# Patient Record
Sex: Male | Born: 1935 | Race: White | Hispanic: No | Smoking: Former smoker
Health system: Southern US, Community
[De-identification: ages and names within clinical notes are randomized; demographics above are authoritative.]

## PROBLEM LIST (undated history)

## (undated) DIAGNOSIS — E119 Type 2 diabetes mellitus without complications: Secondary | ICD-10-CM

## (undated) DIAGNOSIS — G4730 Sleep apnea, unspecified: Secondary | ICD-10-CM

## (undated) DIAGNOSIS — J449 Chronic obstructive pulmonary disease, unspecified: Secondary | ICD-10-CM

## (undated) DIAGNOSIS — I10 Essential (primary) hypertension: Secondary | ICD-10-CM

## (undated) DIAGNOSIS — I2699 Other pulmonary embolism without acute cor pulmonale: Secondary | ICD-10-CM

## (undated) DIAGNOSIS — I2782 Chronic pulmonary embolism: Secondary | ICD-10-CM

## (undated) DIAGNOSIS — E7800 Pure hypercholesterolemia, unspecified: Secondary | ICD-10-CM

## (undated) DIAGNOSIS — Z952 Presence of prosthetic heart valve: Secondary | ICD-10-CM

## (undated) HISTORY — DX: Presence of prosthetic heart valve: Z95.2

## (undated) HISTORY — DX: Type 2 diabetes mellitus without complications: E11.9

## (undated) HISTORY — DX: Other pulmonary embolism without acute cor pulmonale: I26.99

## (undated) HISTORY — PX: OTHER SURGICAL HISTORY: SHX169

## (undated) HISTORY — DX: Chronic pulmonary embolism: I27.82

## (undated) HISTORY — PX: AORTIC VALVE REPLACEMENT: SHX41

## (undated) HISTORY — PX: REPLACEMENT TOTAL KNEE: SUR1224

---

## 2008-03-24 HISTORY — PX: CARDIAC CATHETERIZATION: SHX172

## 2008-10-22 DIAGNOSIS — I2699 Other pulmonary embolism without acute cor pulmonale: Secondary | ICD-10-CM

## 2008-10-22 DIAGNOSIS — R531 Weakness: Secondary | ICD-10-CM

## 2008-10-22 DIAGNOSIS — R739 Hyperglycemia, unspecified: Secondary | ICD-10-CM | POA: Diagnosis present

## 2008-10-22 DIAGNOSIS — N179 Acute kidney failure, unspecified: Secondary | ICD-10-CM | POA: Diagnosis present

## 2008-10-22 DIAGNOSIS — I2782 Chronic pulmonary embolism: Secondary | ICD-10-CM

## 2008-10-22 DIAGNOSIS — Z952 Presence of prosthetic heart valve: Secondary | ICD-10-CM

## 2008-10-22 DIAGNOSIS — Z66 Do not resuscitate: Secondary | ICD-10-CM | POA: Diagnosis present

## 2008-10-22 HISTORY — DX: Presence of prosthetic heart valve: Z95.2

## 2008-10-22 HISTORY — DX: Other pulmonary embolism without acute cor pulmonale: I26.99

## 2008-10-22 HISTORY — DX: Chronic pulmonary embolism: I27.82

## 2016-03-27 ENCOUNTER — Encounter (INDEPENDENT_AMBULATORY_CARE_PROVIDER_SITE_OTHER): Payer: Self-pay | Admitting: Internal Medicine

## 2016-03-27 ENCOUNTER — Encounter (INDEPENDENT_AMBULATORY_CARE_PROVIDER_SITE_OTHER): Payer: Self-pay

## 2016-04-22 ENCOUNTER — Ambulatory Visit (INDEPENDENT_AMBULATORY_CARE_PROVIDER_SITE_OTHER): Payer: Medicare Other | Admitting: Internal Medicine

## 2016-04-22 ENCOUNTER — Encounter (INDEPENDENT_AMBULATORY_CARE_PROVIDER_SITE_OTHER): Payer: Self-pay | Admitting: *Deleted

## 2016-04-22 ENCOUNTER — Encounter (INDEPENDENT_AMBULATORY_CARE_PROVIDER_SITE_OTHER): Payer: Self-pay | Admitting: Internal Medicine

## 2016-04-22 ENCOUNTER — Telehealth (INDEPENDENT_AMBULATORY_CARE_PROVIDER_SITE_OTHER): Payer: Self-pay | Admitting: *Deleted

## 2016-04-22 ENCOUNTER — Other Ambulatory Visit (INDEPENDENT_AMBULATORY_CARE_PROVIDER_SITE_OTHER): Payer: Self-pay | Admitting: Internal Medicine

## 2016-04-22 VITALS — BP 140/72 | HR 72 | Temp 97.8°F | Ht 73.0 in | Wt 220.5 lb

## 2016-04-22 DIAGNOSIS — K625 Hemorrhage of anus and rectum: Secondary | ICD-10-CM

## 2016-04-22 DIAGNOSIS — E7800 Pure hypercholesterolemia, unspecified: Secondary | ICD-10-CM | POA: Insufficient documentation

## 2016-04-22 DIAGNOSIS — I2782 Chronic pulmonary embolism: Secondary | ICD-10-CM | POA: Insufficient documentation

## 2016-04-22 DIAGNOSIS — E119 Type 2 diabetes mellitus without complications: Secondary | ICD-10-CM

## 2016-04-22 DIAGNOSIS — Z952 Presence of prosthetic heart valve: Secondary | ICD-10-CM | POA: Insufficient documentation

## 2016-04-22 HISTORY — DX: Type 2 diabetes mellitus without complications: E11.9

## 2016-04-22 NOTE — Progress Notes (Signed)
   Subjective:    Patient ID: Kevin Kirk, male    DOB: Sep 18, 1932, 83 y.o.   MRN: [id number]  HPI Referred by Dr. Shon Millet for rectal bleeding. Last colonoscopy was in 2008 by Dr. Algis Greenhouse (screening). Normal. Advised 10 yr f/u. He says Dr. Shon Millet wants him to have a colonoscopy. Apparently had some rectal bleeding in December x 3 days. BRRB. Patient stopped his Warfarin after seeing the blood. No change in his bowel.. Has a  BM daily.  No family hx of colon cancer. No weight loss. No GI problems except for the rectal bleeding in 2008.       Warfarin on hold since December.   02/06/2016 H and H 13.4 and 40.8  06/20/2014 Echo. EG 55-60%  Review of Systems Past Medical History:  Diagnosis Date  . Chronic pulmonary embolism ([place]) 04/22/2016  . Diabetes ([place]) 04/22/2016  . H/O aortic valve replacement 04/22/2016  . Pulmonary emboli ([place]) 04/22/2016    No past surgical history on file.  Allergies  Allergen Reactions  . Amoxicillin     hives    No current outpatient prescriptions on file prior to visit.   No current facility-administered medications on file prior to visit.    Current Outpatient Prescriptions  Medication Sig Dispense Refill  . Cholecalciferol (VITAMIN D3) 1000 units CAPS Take 5,000 Units by mouth.    . finasteride (PROSCAR) 5 MG tablet Take 5 mg by mouth daily.    Marland Kitchen glucosamine-chondroitin 500-400 MG tablet Take 1 tablet by mouth 3 (three) times daily.    Marland Kitchen lisinopril (PRINIVIL,ZESTRIL) 5 MG tablet Take 5 mg by mouth daily.    . metFORMIN (GLUCOPHAGE) 500 MG tablet Take by mouth 2 (two) times daily with a meal.    . metoprolol succinate (TOPROL-XL) 50 MG 24 hr tablet Take 50 mg by mouth 2 (two) times daily. Take with or immediately following a meal.    . vitamin B-12 (CYANOCOBALAMIN) 1000 MCG tablet Take 1,000 mcg by mouth daily.    Marland Kitchen atorvastatin (LIPITOR) 10 MG tablet Take 10 mg by mouth daily.     No current facility-administered medications for this  visit.         Objective:   Physical Exam Blood pressure 140/72, pulse 72, temperature 97.8 F (36.6 C), height 6\' 1"  (1.854 m), weight 220 lb 8 oz (100 kg).  Alert and oriented. Skin warm and dry. Oral mucosa is moist.   . Sclera anicteric, conjunctivae is pink. Thyroid not enlarged. No cervical lymphadenopathy. Lungs clear. Heart regular rate and rhythm.  Abdomen is soft. Bowel sounds are positive. No hepatomegaly. No abdominal masses felt. No tenderness.  No edema to lower extremities.        Assessment & Plan:  Rectal bleeding.  Colonic neoplasm, AVM polyp, ulcer needs to be ruled out.  Colonoscopy. The risks and benefits such as perforation, bleeding, and infection were reviewed with the patient and is agreeable.

## 2016-04-22 NOTE — Patient Instructions (Signed)
Colonoscopy.  The risks and benefits such as perforation, bleeding, and infection were reviewed with the patient and is agreeable. 

## 2016-04-22 NOTE — Telephone Encounter (Signed)
Patient needs trilyte 

## 2016-04-28 MED ORDER — PEG 3350-KCL-NA BICARB-NACL 420 G PO SOLR: 4000.0000 mL | Freq: Once | ORAL | 0 refills | Status: AC

## 2016-05-21 ENCOUNTER — Encounter (INDEPENDENT_AMBULATORY_CARE_PROVIDER_SITE_OTHER): Payer: Self-pay

## 2016-06-05 ENCOUNTER — Encounter (HOSPITAL_COMMUNITY)
Admission: RE | Disposition: A | Discharge status: Home / Self Care | Payer: Self-pay | Source: Ambulatory Visit | Attending: Internal Medicine

## 2016-06-05 ENCOUNTER — Encounter (HOSPITAL_COMMUNITY): Payer: Self-pay | Admitting: *Deleted

## 2016-06-05 ENCOUNTER — Ambulatory Visit (HOSPITAL_COMMUNITY)
Admission: RE | Admit: 2016-06-05 | Discharge: 2016-06-05 | Disposition: A | Discharge status: Home / Self Care | Payer: Medicare Other | Source: Ambulatory Visit | Attending: Internal Medicine | Admitting: Internal Medicine

## 2016-06-05 DIAGNOSIS — Z96659 Presence of unspecified artificial knee joint: Secondary | ICD-10-CM | POA: Insufficient documentation

## 2016-06-05 DIAGNOSIS — K5730 Diverticulosis of large intestine without perforation or abscess without bleeding: Secondary | ICD-10-CM

## 2016-06-05 DIAGNOSIS — K625 Hemorrhage of anus and rectum: Secondary | ICD-10-CM | POA: Diagnosis not present

## 2016-06-05 DIAGNOSIS — K6389 Other specified diseases of intestine: Secondary | ICD-10-CM

## 2016-06-05 DIAGNOSIS — Z952 Presence of prosthetic heart valve: Secondary | ICD-10-CM | POA: Insufficient documentation

## 2016-06-05 DIAGNOSIS — I10 Essential (primary) hypertension: Secondary | ICD-10-CM | POA: Diagnosis not present

## 2016-06-05 DIAGNOSIS — Z86711 Personal history of pulmonary embolism: Secondary | ICD-10-CM | POA: Insufficient documentation

## 2016-06-05 DIAGNOSIS — E119 Type 2 diabetes mellitus without complications: Secondary | ICD-10-CM | POA: Diagnosis not present

## 2016-06-05 DIAGNOSIS — C184 Malignant neoplasm of transverse colon: Secondary | ICD-10-CM

## 2016-06-05 DIAGNOSIS — Z7982 Long term (current) use of aspirin: Secondary | ICD-10-CM | POA: Diagnosis not present

## 2016-06-05 DIAGNOSIS — E7800 Pure hypercholesterolemia, unspecified: Secondary | ICD-10-CM | POA: Insufficient documentation

## 2016-06-05 DIAGNOSIS — Z79899 Other long term (current) drug therapy: Secondary | ICD-10-CM | POA: Diagnosis not present

## 2016-06-05 DIAGNOSIS — K921 Melena: Secondary | ICD-10-CM | POA: Insufficient documentation

## 2016-06-05 DIAGNOSIS — Z7984 Long term (current) use of oral hypoglycemic drugs: Secondary | ICD-10-CM | POA: Insufficient documentation

## 2016-06-05 HISTORY — DX: Pure hypercholesterolemia, unspecified: E78.00

## 2016-06-05 HISTORY — PX: COLONOSCOPY: SHX5424

## 2016-06-05 HISTORY — DX: Essential (primary) hypertension: I10

## 2016-06-05 LAB — GLUCOSE, CAPILLARY: Glucose-Capillary: 123 mg/dL — ABNORMAL HIGH (ref 65–99)

## 2016-06-05 SURGERY — COLONOSCOPY
Anesthesia: Moderate Sedation

## 2016-06-05 MED ORDER — SODIUM CHLORIDE 0.9 % IV SOLN
INTRAVENOUS | Status: DC
  Administered 2016-06-05: 13:00:00 via INTRAVENOUS

## 2016-06-05 MED ORDER — MIDAZOLAM HCL 5 MG/5ML IJ SOLN
INTRAMUSCULAR | Status: AC
  Filled 2016-06-05: qty 10

## 2016-06-05 MED ORDER — MEPERIDINE HCL 50 MG/ML IJ SOLN
INTRAMUSCULAR | Status: AC
  Filled 2016-06-05: qty 1

## 2016-06-05 MED ORDER — MIDAZOLAM HCL 5 MG/5ML IJ SOLN
INTRAMUSCULAR | Status: DC | PRN
  Administered 2016-06-05: 2 mg via INTRAVENOUS
  Administered 2016-06-05 (×2): 1 mg via INTRAVENOUS

## 2016-06-05 MED ORDER — MEPERIDINE HCL 50 MG/ML IJ SOLN
INTRAMUSCULAR | Status: DC | PRN
  Administered 2016-06-05 (×2): 25 mg

## 2016-06-05 MED ORDER — STERILE WATER FOR IRRIGATION IR SOLN
Status: DC | PRN
  Administered 2016-06-05: 13:00:00

## 2016-06-05 NOTE — Discharge Instructions (Signed)
Resume usual medications including aspirin. Resume usual diet. No driving for 24 hours. Physician will call with biopsy results and further recommendations.        Colonoscopy, Adult, Care After This sheet gives you information about how to care for yourself after your procedure. Your doctor may also give you more specific instructions. If you have problems or questions, call your doctor. Follow these instructions at home: General instructions    For the first 24 hours after the procedure:  Do not drive or use machinery.  Do not sign important documents.  Do not drink alcohol.  Do your daily activities more slowly than normal.  Eat foods that are soft and easy to digest.  Rest often.  Take over-the-counter or prescription medicines only as told by your doctor.  It is up to you to get the results of your procedure. Ask your doctor, or the department performing the procedure, when your results will be ready. To help cramping and bloating:   Try walking around.  Put heat on your belly (abdomen) as told by your doctor. Use a heat source that your doctor recommends, such as a moist heat pack or a heating pad.  Put a towel between your skin and the heat source.  Leave the heat on for 20-30 minutes.  Remove the heat if your skin turns bright red. This is especially important if you cannot feel pain, heat, or cold. You can get burned. Eating and drinking   Drink enough fluid to keep your pee (urine) clear or pale yellow.  Return to your normal diet as told by your doctor. Avoid heavy or fried foods that are hard to digest.  Avoid drinking alcohol for as long as told by your doctor. Contact a doctor if:  You have blood in your poop (stool) 2-3 days after the procedure. Get help right away if:  You have more than a small amount of blood in your poop.  You see large clumps of tissue (blood clots) in your poop.  Your belly is swollen.  You feel sick to your stomach  (nauseous).  You throw up (vomit).  You have a fever.  You have belly pain that gets worse, and medicine does not help your pain.    Diverticulosis Diverticulosis is a condition that develops when small pouches (diverticula) form in the wall of the large intestine (colon). The colon is where water is absorbed and stool is formed. The pouches form when the inside layer of the colon pushes through weak spots in the outer layers of the colon. You may have a few pouches or many of them. What are the causes? The cause of this condition is not known. What increases the risk? The following factors may make you more likely to develop this condition:  Being older than age 34. Your risk for this condition increases with age. Diverticulosis is rare among people younger than age 81. By age 58, many people have it.  Eating a low-fiber diet.  Having frequent constipation.  Being overweight.  Not getting enough exercise.  Smoking.  Taking over-the-counter pain medicines, like aspirin and ibuprofen.  Having a family history of diverticulosis. What are the signs or symptoms? In most people, there are no symptoms of this condition. If you do have symptoms, they may include:  Bloating.  Cramps in the abdomen.  Constipation or diarrhea.  Pain in the lower left side of the abdomen. How is this diagnosed? This condition is most often diagnosed during an exam  for other colon problems. Because diverticulosis usually has no symptoms, it often cannot be diagnosed independently. This condition may be diagnosed by:  Using a flexible scope to examine the colon (colonoscopy).  Taking an X-ray of the colon after dye has been put into the colon (barium enema).  Doing a CT scan. How is this treated? You may not need treatment for this condition if you have never developed an infection related to diverticulosis. If you have had an infection before, treatment may include:  Eating a high-fiber diet.  This may include eating more fruits, vegetables, and grains.  Taking a fiber supplement.  Taking a live bacteria supplement (probiotic).  Taking medicine to relax your colon.  Taking antibiotic medicines. Follow these instructions at home:  Drink 6-8 glasses of water or more each day to prevent constipation.  Try not to strain when you have a bowel movement.  If you have had an infection before:  Eat more fiber as directed by your health care provider or your diet and nutrition specialist (dietitian).  Take a fiber supplement or probiotic, if your health care provider approves.  Take over-the-counter and prescription medicines only as told by your health care provider.  If you were prescribed an antibiotic, take it as told by your health care provider. Do not stop taking the antibiotic even if you start to feel better.  Keep all follow-up visits as told by your health care provider. This is important. Contact a health care provider if:  You have pain in your abdomen.  You have bloating.  You have cramps.  You have not had a bowel movement in 3 days. Get help right away if:  Your pain gets worse.  Your bloating becomes very bad.  You have a fever or chills, and your symptoms suddenly get worse.  You vomit.  You have bowel movements that are bloody or black.  You have bleeding from your rectum. Summary  Diverticulosis is a condition that develops when small pouches (diverticula) form in the wall of the large intestine (colon).  You may have a few pouches or many of them.  This condition is most often diagnosed during an exam for other colon problems.  If you have had an infection related to diverticulosis, treatment may include increasing the fiber in your diet, taking supplements, or taking medicines. This information is not intended to replace advice given to you by your health care provider. Make sure you discuss any questions you have with your health care  provider. Document Released: 12/06/2003 Document Revised: 01/28/2016 Document Reviewed: 01/28/2016 Elsevier Interactive Patient Education  2017 [employer].

## 2016-06-05 NOTE — Op Note (Signed)
[facility] Patient Name: Kevin Kirk Procedure Date: 06/05/2016 1:09 PM MRN: [id number] Date of Birth: [birth year] Attending MD: Hildred Laser , MD CSN: [id number] Age: 83 Admit Type: Outpatient Procedure:                Colonoscopy Indications:              Rectal bleeding Providers:                Hildred Laser, MD, Janeece Riggers, RN, Purcell Nails. [place],                            [employer] Referring MD:             Tilden Fossa. Shon Millet, MD Medicines:                Meperidine 50 mg IV, Midazolam 4 mg IV Complications:            No immediate complications. Estimated Blood Loss:     Estimated blood loss was minimal. Procedure:                Pre-Anesthesia Assessment:                           - Prior to the procedure, a History and Physical                            was performed, and patient medications and                            allergies were reviewed. The patient's tolerance of                            previous anesthesia was also reviewed. The risks                            and benefits of the procedure and the sedation                            options and risks were discussed with the patient.                            All questions were answered, and informed consent                            was obtained. Prior Anticoagulants: The patient                            last took aspirin 1 day prior to the procedure. ASA                            Grade Assessment: III - A patient with severe                            systemic disease. After reviewing the risks and  benefits, the patient was deemed in satisfactory                            condition to undergo the procedure.                           After obtaining informed consent, the colonoscope                            was passed under direct vision. Throughout the                            procedure, the patient's blood pressure, pulse, and                            oxygen  saturations were monitored continuously. The                            [id number] ([id number]) scope was introduced through                            the anus and advanced to the the cecum, identified                            by appendiceal orifice and ileocecal valve. The                            colonoscopy was performed without difficulty. The                            patient tolerated the procedure well. The quality                            of the bowel preparation was adequate to identify                            polyps. The ileocecal valve, appendiceal orifice,                            and rectum were photographed. Scope In: 1:24:00 PM Scope Out: 1:56:20 PM Scope Withdrawal Time: 0 hours 12 minutes 28 seconds  Total Procedure Duration: 0 hours 32 minutes 20 seconds  Findings:      The perianal and digital rectal examinations were normal.      One 20 mm submucosal nodule was found in the cecum.      A fungating, polypoid and ulcerated non-obstructing large mass was found       in the mid transverse colon. The mass was partially circumferential       (involving two-thirds of the lumen circumference). The mass measured       three cm in length. No bleeding was present. Mucosa was biopsied with a       cold forceps for histology. One specimen bottle was sent to pathology.      Multiple medium-mouthed diverticula were found in the sigmoid colon.  The retroflexed view of the distal rectum and anal verge was normal and       showed no anal or rectal abnormalities. 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                            Biopsied.                           - Diverticulosis in the sigmoid colon. Moderate Sedation:      Moderate (conscious) sedation was administered by the endoscopy nurse       and supervised by the  endoscopist. The following parameters were       monitored: oxygen saturation, heart rate, blood pressure, CO2       capnography and response to care. Total physician intraservice time was       37 minutes. Recommendation:           - Patient has a contact number available for                            emergencies. The signs and symptoms of potential                            delayed complications were discussed with the                            patient. Return to normal activities tomorrow.                            Written discharge instructions were provided to the                            patient.                           - Resume previous diet today.                           - Continue present medications.                           - Await pathology results.                           - Repeat colonoscopy date to be determined after                            pending pathology results are reviewed.                           -  Will arrange for abdominopelvic CT with contrast                            once biopsy results reviewed. Procedure Code(s):        --- Professional ---                           [phone], Colonoscopy, flexible; diagnostic, including                            collection of specimen(s) by brushing or washing,                            when performed (separate procedure)                           99152, Moderate sedation services provided by the                            same physician or other qualified health care                            professional performing the diagnostic or                            therapeutic service that the sedation supports,                            requiring the presence of an independent trained                            observer to assist in the monitoring of the                            patient's level of consciousness and physiological                            status; initial 15 minutes of intraservice time,                             patient age 85 years or older                           [phone], Moderate sedation services; each additional                            15 minutes intraservice time Diagnosis Code(s):        --- Professional ---                           K63.89, Other specified diseases of intestine                           C18.4, Malignant neoplasm of transverse colon  K62.5, Hemorrhage of anus and rectum                           K57.30, Diverticulosis of large intestine without                            perforation or abscess without bleeding CPT copyright 2016 American Medical Association. All rights reserved. The codes documented in this report are preliminary and upon coder review may  be revised to meet current compliance requirements. Hildred Laser, MD Hildred Laser, MD 06/05/2016 2:28:50 PM This report has been signed electronically. Number of Addenda: 0

## 2016-06-05 NOTE — H&P (Signed)
Kevin Kirk is an 83 y.o. male.   Chief Complaint: Patient is here for colonoscopy. HPI: Patient is 83 year old Caucasian male with normal colonoscopy 8 years ago and experienced over 2 days of bright red blood per rectum back in December while he was on warfarin. He was managed at home by his PCP. He did experience some lightheadedness. Warfarin was discontinued. He is not on low-dose aspirin. He has not had any more episodes of rectal bleeding. Says he has lost 24 pounds since he lost his wife 6 months ago. He denies diarrhea and no constipation. Family History is negative for CRC.  Past Medical History:  Diagnosis Date  . Chronic pulmonary embolism ([place]) 04/22/2016  . Diabetes ([place]) 04/22/2016  . H/O aortic valve replacement 04/22/2016  . Hypercholesteremia   . Hypertension   . Pulmonary emboli ([place]) 04/22/2016    Past Surgical History:  Procedure Laterality Date  . AORTIC VALVE REPLACEMENT     2010  . REPLACEMENT TOTAL KNEE     1996  . spenectomy     from trauma    Family History  Problem Relation Age of Onset  . Colon cancer Neg Hx    Social History:  reports that he has never smoked. He has never used smokeless tobacco. He reports that he does not drink alcohol or use drugs.  Allergies:  Allergies  Allergen Reactions  . Amoxicillin Hives    Has patient had a PCN reaction causing immediate rash, facial/tongue/throat swelling, SOB or lightheadedness with hypotension: No Has patient had a PCN reaction causing severe rash involving mucus membranes or skin necrosis: Yes Has patient had a PCN reaction that required hospitalization No Has patient had a PCN reaction occurring within the last 10 years: No If all of the above answers are "NO", then may proceed with Cephalosporin use.   . Prednisone     Pt does not want to take this medication   . Tamsulosin Hcl     Cant sleep, confusion     Medications Prior to Admission  Medication Sig Dispense Refill  . aspirin EC 81  MG tablet Take 81 mg by mouth daily.    Marland Kitchen atorvastatin (LIPITOR) 10 MG tablet Take 10 mg by mouth at bedtime.     . Cholecalciferol (VITAMIN D3) 1000 units CAPS Take 1,000 Units by mouth at bedtime.     . finasteride (PROSCAR) 5 MG tablet Take 5 mg by mouth at bedtime.     . Glucosamine HCl 1000 MG TABS Take 1,000 mg by mouth 2 (two) times daily.    Marland Kitchen lisinopril (PRINIVIL,ZESTRIL) 5 MG tablet Take 5 mg by mouth at bedtime.     . metFORMIN (GLUCOPHAGE) 500 MG tablet Take 500 mg by mouth 2 (two) times daily with a meal.     . metoprolol (LOPRESSOR) 50 MG tablet Take 50 mg by mouth 2 (two) times daily.    . vitamin B-12 (CYANOCOBALAMIN) 1000 MCG tablet Take 1,000 mcg by mouth at bedtime.     Marland Kitchen acetaminophen (TYLENOL) 500 MG tablet Take 500 mg by mouth daily as needed for moderate pain or headache.      Results for orders placed or performed during the hospital encounter of 06/05/16 (from the past 48 hour(s))  Glucose, capillary     Status: Abnormal   Collection Time: 06/05/16 12:30 PM  Result Value Ref Range   Glucose-Capillary 123 (H) 65 - 99 mg/dL   No results found.  ROS  Blood pressure Marland Kitchen)  143/78, pulse 78, temperature 97.7 F (36.5 C), temperature source Oral, resp. rate 14, height 6\' 1"  (1.854 m), weight 216 lb (98 kg), SpO2 98 %. Physical Exam  Constitutional: He appears well-developed and well-nourished.  HENT:  Mouth/Throat: Oropharynx is clear and moist.  Eyes: Conjunctivae are normal. No scleral icterus.  Neck: No thyromegaly present.  Cardiovascular: Normal rate, regular rhythm and normal heart sounds.   No murmur heard. Respiratory: Effort normal and breath sounds normal.  GI:  The meniscus full symmetrical. It is soft with mild hypogastric tenderness. No organomegaly or masses.  Musculoskeletal: He exhibits no edema.  Lymphadenopathy:    He has no cervical adenopathy.  Neurological: He is alert.  Skin: Skin is warm and dry.     Assessment/Plan Large-volume  hematochezia. Diagnostic colonoscopy.  Hildred Laser, MD 06/05/2016, 1:15 PM

## 2016-06-10 ENCOUNTER — Encounter (HOSPITAL_COMMUNITY): Payer: Self-pay | Admitting: Internal Medicine

## 2016-06-10 ENCOUNTER — Other Ambulatory Visit (INDEPENDENT_AMBULATORY_CARE_PROVIDER_SITE_OTHER): Payer: Self-pay | Admitting: Internal Medicine

## 2016-06-10 DIAGNOSIS — C189 Malignant neoplasm of colon, unspecified: Secondary | ICD-10-CM

## 2016-06-20 ENCOUNTER — Ambulatory Visit (HOSPITAL_COMMUNITY)
Admission: RE | Admit: 2016-06-20 | Discharge: 2016-06-20 | Disposition: A | Discharge status: Home / Self Care | Payer: Medicare Other | Source: Ambulatory Visit | Attending: Internal Medicine | Admitting: Internal Medicine

## 2016-06-20 DIAGNOSIS — C189 Malignant neoplasm of colon, unspecified: Secondary | ICD-10-CM | POA: Diagnosis not present

## 2016-06-20 LAB — POCT I-STAT CREATININE: Creatinine, Ser: 1 mg/dL (ref 0.61–1.24)

## 2016-06-20 MED ORDER — IOPAMIDOL (ISOVUE-300) INJECTION 61%
100.0000 mL | Freq: Once | INTRAVENOUS | Status: AC | PRN
  Administered 2016-06-20: 100 mL via INTRAVENOUS

## 2016-07-03 ENCOUNTER — Ambulatory Visit (INDEPENDENT_AMBULATORY_CARE_PROVIDER_SITE_OTHER): Payer: Medicare Other | Admitting: General Surgery

## 2016-07-03 ENCOUNTER — Encounter: Payer: Self-pay | Admitting: General Surgery

## 2016-07-03 VITALS — BP 134/72 | HR 71 | Temp 97.7°F | Resp 18 | Ht 73.0 in | Wt 223.0 lb

## 2016-07-03 DIAGNOSIS — C184 Malignant neoplasm of transverse colon: Secondary | ICD-10-CM | POA: Diagnosis not present

## 2016-07-03 MED ORDER — METRONIDAZOLE 500 MG PO TABS: 500.0000 mg | ORAL_TABLET | Freq: Three times a day (TID) | ORAL | 0 refills | Status: DC

## 2016-07-03 MED ORDER — PEG 3350-KCL-NABCB-NACL-NASULF 236 G PO SOLR: 4000.0000 mL | Freq: Once | ORAL | 0 refills | Status: AC

## 2016-07-03 MED ORDER — NEOMYCIN SULFATE 500 MG PO TABS: 500.0000 mg | ORAL_TABLET | Freq: Three times a day (TID) | ORAL | 0 refills | Status: DC

## 2016-07-03 NOTE — Progress Notes (Signed)
Kevin Kirk; [id number]; April 17, 1932   HPI Patient is an 83 year old white male who was found on colonoscopy by Dr. Laural Golden to have a transverse colon mass. Biopsies of this revealed adenocarcinoma. The patient is being referred by Dr. Laural Golden for a partial colectomy. Patient denies any family history of colon cancer. He states he did have an episode of blood per rectum for which the colonoscopy was indicated. He denies any weight loss, fever, chills, diarrhea, or constipation. He states he has a remote history of pulmonary embolus after knee surgery. He is not on any anticoagulation. He has been seen by his primary care physician and cleared for surgery. Past Medical History:  Diagnosis Date  . Chronic pulmonary embolism ([place]) 04/22/2016  . Diabetes ([place]) 04/22/2016  . H/O aortic valve replacement 04/22/2016  . Hypercholesteremia   . Hypertension   . Pulmonary emboli ([place]) 04/22/2016    Past Surgical History:  Procedure Laterality Date  . AORTIC VALVE REPLACEMENT     2010  . COLONOSCOPY N/A 06/05/2016   Procedure: COLONOSCOPY;  Surgeon: Rogene Houston, MD;  [facility];  Service: Endoscopy;  Laterality: N/A;  . REPLACEMENT TOTAL KNEE     1996  . spenectomy     from trauma    Family History  Problem Relation Age of Onset  . Colon cancer Neg Hx     Current Outpatient Prescriptions on File Prior to Visit  Medication Sig Dispense Refill  . acetaminophen (TYLENOL) 500 MG tablet Take 500 mg by mouth daily as needed for moderate pain or headache.    Marland Kitchen aspirin EC 81 MG tablet Take 81 mg by mouth daily.    Marland Kitchen atorvastatin (LIPITOR) 10 MG tablet Take 10 mg by mouth at bedtime.     . Cholecalciferol (VITAMIN D3) 1000 units CAPS Take 1,000 Units by mouth at bedtime.     . finasteride (PROSCAR) 5 MG tablet Take 5 mg by mouth at bedtime.     . Glucosamine HCl 1000 MG TABS Take 1,000 mg by mouth 2 (two) times daily.    Marland Kitchen lisinopril (PRINIVIL,ZESTRIL) 5 MG tablet Take 5 mg by mouth at  bedtime.     . metFORMIN (GLUCOPHAGE) 500 MG tablet Take 500 mg by mouth 2 (two) times daily with a meal.     . metoprolol (LOPRESSOR) 50 MG tablet Take 50 mg by mouth 2 (two) times daily.    . vitamin B-12 (CYANOCOBALAMIN) 1000 MCG tablet Take 1,000 mcg by mouth at bedtime.      No current facility-administered medications on file prior to visit.     Allergies  Allergen Reactions  . Amoxicillin Hives    Has patient had a PCN reaction causing immediate rash, facial/tongue/throat swelling, SOB or lightheadedness with hypotension: No Has patient had a PCN reaction causing severe rash involving mucus membranes or skin necrosis: Yes Has patient had a PCN reaction that required hospitalization No Has patient had a PCN reaction occurring within the last 10 years: No If all of the above answers are "NO", then may proceed with Cephalosporin use.   . Prednisone     Pt does not want to take this medication   . Tamsulosin Hcl     Cant sleep, confusion     History  Alcohol Use No    History  Smoking Status  . Never Smoker  Smokeless Tobacco  . Never Used    Review of Systems  Constitutional: Negative.   HENT: Negative.   Eyes:  Negative.   Respiratory: Negative.   Cardiovascular: Negative.   Genitourinary: Negative.   Musculoskeletal: Positive for back pain.  Skin: Negative.   Neurological: Negative.   Endo/Heme/Allergies:       DVT after knee surgery.  Psychiatric/Behavioral: Negative.     Objective   Vitals:   07/03/16 0927  BP: 134/72  Pulse: 71  Resp: 18  Temp: 97.7 F (36.5 C)    Physical Exam  Constitutional: He is oriented to person, place, and time and well-developed, well-nourished, and in no distress.  HENT:  Head: Normocephalic and atraumatic.  Neck: Normal range of motion. Neck supple. No JVD present.  Cardiovascular: Normal rate, regular rhythm and normal heart sounds.   No murmur heard. Pulmonary/Chest: Effort normal and breath sounds normal. He has  no wheezes. He has no rales.  Abdominal: Soft. Bowel sounds are normal. He exhibits mass. He exhibits no distension. There is no tenderness.  Possible mass palpable in right upper quadrant of abdomen.  Lymphadenopathy:    He has no cervical adenopathy.  Neurological: He is alert and oriented to person, place, and time.  Skin: Skin is warm and dry.  Vitals reviewed.   Primary care physician's notes reviewed. Dr. Olevia Perches notes reviewed. CT scan of abdomen reviewed. Assessment  Colon carcinoma, history of DVT, history of PE, status post aortic valve replacement and past Plan   Patient is scheduled for a partial colectomy on 07/11/2016. The risks and benefits of the procedure including bleeding, infection, cardiopulmonary difficulties, the possibility of a blood transfusion, and the possibility of an anastomotic leak were fully explained to the patient, who gave informed consent. GoLYTELY, neomycin, and Flagyl have been ordered preoperatively.

## 2016-07-03 NOTE — Patient Instructions (Signed)
Colorectal Cancer Colorectal cancer is an abnormal growth of tissue (tumor) in the colon or rectum that is cancerous (malignant). Unlike noncancerous (benign) tumors, malignant tumors can spread to other parts of your body. The colon is the large bowel or large intestine. The rectum is the last several inches of the colon. What increases the risk? The exact cause of colorectal cancer is unknown. However, the following factors may increase your chances of getting colorectal cancer:  Age older than 11 years.  Abnormal growths (polyps) on the inner wall of the colon or rectum.  Diabetes.  African American race.  Family history of hereditary nonpolyposis colorectal cancer. This condition is caused by changes in the genes that are responsible for repairing mismatched DNA.  Personal history of cancer. A person who has already had colorectal cancer may develop it a second time. Also, women with a history of ovarian, uterine, or breast cancer are at a somewhat higher risk of developing colorectal cancer.  Certain hereditary conditions.  Eating a diet that is high in fat (especially animal fat) and low in fiber, fruits, and vegetables.  Sedentary lifestyle.  Inflammatory bowel disease, including ulcerative colitis and Crohn's disease.  Smoking.  Excessive alcohol use. What are the signs or symptoms? Early colorectal cancer often does not cause symptoms. As the cancer grows, symptoms may include:  Changes in bowel habits.  Diarrhea.  Constipation.  Feeling like the bowel does not empty completely after a bowel movement.  Blood in the stool.  Stools that are narrower than usual.  Abdominal discomfort, pain, bloating, fullness, or cramps.  Frequent gas pain.  Unexplained weight loss.  Constant tiredness.  Nausea and vomiting. How is this diagnosed? Your health care provider will ask about your medical history. He or she may also perform a number of procedures, such as:  A  physical exam.  A digital rectal exam.  A fecal occult blood test.  A barium enema.  Blood tests.  X-rays.  Imaging tests, such as CT scans or MRIs.  Taking a tissue sample (biopsy) from your colon or rectum to look for cancer cells.  A sigmoidoscopy to view the inside of the last part of your colon.  A colonoscopy to view the inside of your entire colon.  An endorectal ultrasound to see how deep a rectal tumor has grown and whether the cancer has spread to lymph nodes or other nearby tissues. Your cancer will be staged to determine its severity and extent. Staging is a careful attempt to find out the size of the tumor, whether the cancer has spread, and if so, to what parts of the body. You may need to have more tests to determine the stage of your cancer. The test results will help determine what treatment plan is best for you.  Stage 0. The cancer is found only in the innermost lining of the colon or rectum.  Stage I. The cancer has grown into the inner wall of the colon or rectum. The cancer has not yet reached the outer wall of the colon.  Stage II. The cancer extends more deeply into or through the wall of the colon or rectum. It may have invaded nearby tissue, but cancer cells have not spread to the lymph nodes.  Stage III. The cancer has spread to nearby lymph nodes but not to other parts of the body.  Stage IV. The cancer has spread to other parts of the body, such as the liver or lungs. Your health care provider may  tell you the detailed stage of your cancer, which includes both a number and a letter. How is this treated? Depending on the type and stage, colorectal cancer may be treated with surgery, radiation therapy, chemotherapy, targeted therapy, or radiofrequency ablation. Some people have a combination of these therapies. Surgery may be done to remove the polyps from your colon. In early stages, your health care provider may be able to do this during a colonoscopy.  In later stages, surgery may be done to remove part of your colon. Follow these instructions at home:  Take medicines only as directed by your health care provider.  Maintain a healthy diet.  Consider joining a support group. This may help you learn to cope with the stress of having colorectal cancer.  Seek advice to help you manage treatment of side effects.  Keep all follow-up visits as directed by your health care provider.  Inform your cancer specialist if you are admitted to the hospital. Contact a health care provider if:  Your diarrhea or constipation does not go away.  Your bowel habits change.  You have increased abdominal pain.  You notice new fatigue or weakness.  You lose weight. This information is not intended to replace advice given to you by your health care provider. Make sure you discuss any questions you have with your health care provider. Document Released: 03/10/2005 Document Revised: 08/16/2015 Document Reviewed: 09/02/2012 Elsevier Interactive Patient Education  2017 [employer].

## 2016-07-03 NOTE — H&P (Signed)
Kevin Kirk; [id number]; [birth year]   HPI Patient is an 83 year old white male who was found on colonoscopy by Dr. Laural Golden to have a transverse colon mass. Biopsies of this revealed adenocarcinoma. The patient is being referred by Dr. Laural Golden for a partial colectomy. Patient denies any family history of colon cancer. He states he did have an episode of blood per rectum for which the colonoscopy was indicated. He denies any weight loss, fever, chills, diarrhea, or constipation. He states he has a remote history of pulmonary embolus after knee surgery. He is not on any anticoagulation. He has been seen by his primary care physician and cleared for surgery.     Past Medical History:  Diagnosis Date  . Chronic pulmonary embolism ([place]) 04/22/2016  . Diabetes ([place]) 04/22/2016  . H/O aortic valve replacement 04/22/2016  . Hypercholesteremia   . Hypertension   . Pulmonary emboli ([place]) 04/22/2016         Past Surgical History:  Procedure Laterality Date  . AORTIC VALVE REPLACEMENT     2010  . COLONOSCOPY N/A 06/05/2016   Procedure: COLONOSCOPY;  Surgeon: Rogene Houston, MD;  Location[facility];  Service: Endoscopy;  Laterality: N/A;  . REPLACEMENT TOTAL KNEE     1996  . spenectomy     from trauma         Family History  Problem Relation Age of Onset  . Colon cancer Neg Hx           Current Outpatient Prescriptions on File Prior to Visit  Medication Sig Dispense Refill  . acetaminophen (TYLENOL) 500 MG tablet Take 500 mg by mouth daily as needed for moderate pain or headache.    Marland Kitchen aspirin EC 81 MG tablet Take 81 mg by mouth daily.    Marland Kitchen atorvastatin (LIPITOR) 10 MG tablet Take 10 mg by mouth at bedtime.     . Cholecalciferol (VITAMIN D3) 1000 units CAPS Take 1,000 Units by mouth at bedtime.     . finasteride (PROSCAR) 5 MG tablet Take 5 mg by mouth at bedtime.     . Glucosamine HCl 1000 MG TABS Take 1,000 mg by mouth 2 (two) times daily.    Marland Kitchen  lisinopril (PRINIVIL,ZESTRIL) 5 MG tablet Take 5 mg by mouth at bedtime.     . metFORMIN (GLUCOPHAGE) 500 MG tablet Take 500 mg by mouth 2 (two) times daily with a meal.     . metoprolol (LOPRESSOR) 50 MG tablet Take 50 mg by mouth 2 (two) times daily.    . vitamin B-12 (CYANOCOBALAMIN) 1000 MCG tablet Take 1,000 mcg by mouth at bedtime.      No current facility-administered medications on file prior to visit.          Allergies  Allergen Reactions  . Amoxicillin Hives    Has patient had a PCN reaction causing immediate rash, facial/tongue/throat swelling, SOB or lightheadedness with hypotension: No Has patient had a PCN reaction causing severe rash involving mucus membranes or skin necrosis: Yes Has patient had a PCN reaction that required hospitalization No Has patient had a PCN reaction occurring within the last 10 years: No If all of the above answers are "NO", then may proceed with Cephalosporin use.   . Prednisone     Pt does not want to take this medication   . Tamsulosin Hcl     Cant sleep, confusion        History  Alcohol Use No  History  Smoking Status  . Never Smoker  Smokeless Tobacco  . Never Used    Review of Systems  Constitutional: Negative.   HENT: Negative.   Eyes: Negative.   Respiratory: Negative.   Cardiovascular: Negative.   Genitourinary: Negative.   Musculoskeletal: Positive for back pain.  Skin: Negative.   Neurological: Negative.   Endo/Heme/Allergies:       DVT after knee surgery.  Psychiatric/Behavioral: Negative.     Objective      Vitals:   07/03/16 0927  BP: 134/72  Pulse: 71  Resp: 18  Temp: 97.7 F (36.5 C)    Physical Exam  Constitutional: He is oriented to person, place, and time and well-developed, well-nourished, and in no distress.  HENT:  Head: Normocephalic and atraumatic.  Neck: Normal range of motion. Neck supple. No JVD present.  Cardiovascular: Normal rate, regular  rhythm and normal heart sounds.   No murmur heard. Pulmonary/Chest: Effort normal and breath sounds normal. He has no wheezes. He has no rales.  Abdominal: Soft. Bowel sounds are normal. He exhibits mass. He exhibits no distension. There is no tenderness.  Possible mass palpable in right upper quadrant of abdomen.  Lymphadenopathy:    He has no cervical adenopathy.  Neurological: He is alert and oriented to person, place, and time.  Skin: Skin is warm and dry.  Vitals reviewed.   Primary care physician's notes reviewed. Dr. Olevia Perches notes reviewed. CT scan of abdomen reviewed. Assessment  Colon carcinoma, history of DVT, history of PE, status post aortic valve replacement and past Plan   Patient is scheduled for a partial colectomy on 07/11/2016. The risks and benefits of the procedure including bleeding, infection, cardiopulmonary difficulties, the possibility of a blood transfusion, and the possibility of an anastomotic leak were fully explained to the patient, who gave informed consent. GoLYTELY, neomycin, and Flagyl have been ordered preoperatively.

## 2016-07-04 NOTE — Patient Instructions (Signed)
    Kevin Kirk  07/04/2016     @PREFPERIOPPHARMACY @   Your procedure is scheduled on 07/11/2016.  Report to [facility] at 6:15 A.M.  Call this number if you have problems the morning of surgery:  [phone]   Remember:  Do not eat food or drink liquids after midnight.  Take these medicines the morning of surgery with A SIP OF WATER Lisinopril, Metoprolol   Do not wear jewelry, make-up or nail polish.  Do not wear lotions, powders, or perfumes, or deoderant.  Do not shave 48 hours prior to surgery.  Men may shave face and neck.  Do not bring valuables to the hospital.  [facility] is not responsible for any belongings or valuables.  Contacts, dentures or bridgework may not be worn into surgery.  Leave your suitcase in the car.  After surgery it may be brought to your room.  For patients admitted to the hospital, discharge time will be determined by your treatment team.  Patients discharged the day of surgery will not be allowed to drive home.    Please read over the following fact sheets that you were given. Surgical Site Infection Prevention and Anesthesia Post-op Instructions     PATIENT INSTRUCTIONS POST-ANESTHESIA  IMMEDIATELY FOLLOWING SURGERY:  Do not drive or operate machinery for the first twenty four hours after surgery.  Do not make any important decisions for twenty four hours after surgery or while taking narcotic pain medications or sedatives.  If you develop intractable nausea and vomiting or a severe headache please notify your doctor immediately.  FOLLOW-UP:  Please make an appointment with your surgeon as instructed. You do not need to follow up with anesthesia unless specifically instructed to do so.  WOUND CARE INSTRUCTIONS (if applicable):  Keep a dry clean dressing on the anesthesia/puncture wound site if there is drainage.  Once the wound has quit draining you may leave it open to air.  Generally you should leave the bandage intact for twenty four  hours unless there is drainage.  If the epidural site drains for more than 36-48 hours please call the anesthesia department.  QUESTIONS?:  Please feel free to call your physician or the hospital operator if you have any questions, and they will be happy to assist you.

## 2016-07-07 ENCOUNTER — Ambulatory Visit (HOSPITAL_COMMUNITY)
Admission: RE | Admit: 2016-07-07 | Discharge: 2016-07-07 | Disposition: A | Discharge status: Home / Self Care | Payer: Medicare Other | Source: Ambulatory Visit | Attending: General Surgery | Admitting: General Surgery

## 2016-07-07 ENCOUNTER — Encounter (HOSPITAL_COMMUNITY)
Admission: RE | Admit: 2016-07-07 | Discharge: 2016-07-07 | Disposition: A | Discharge status: Home / Self Care | Payer: Medicare Other | Source: Ambulatory Visit | Attending: General Surgery | Admitting: General Surgery

## 2016-07-07 ENCOUNTER — Other Ambulatory Visit: Payer: Self-pay

## 2016-07-07 ENCOUNTER — Encounter (HOSPITAL_COMMUNITY): Payer: Self-pay

## 2016-07-07 DIAGNOSIS — C189 Malignant neoplasm of colon, unspecified: Secondary | ICD-10-CM | POA: Diagnosis present

## 2016-07-07 DIAGNOSIS — I708 Atherosclerosis of other arteries: Secondary | ICD-10-CM | POA: Diagnosis not present

## 2016-07-07 DIAGNOSIS — Z951 Presence of aortocoronary bypass graft: Secondary | ICD-10-CM | POA: Diagnosis not present

## 2016-07-07 DIAGNOSIS — R918 Other nonspecific abnormal finding of lung field: Secondary | ICD-10-CM | POA: Diagnosis not present

## 2016-07-07 HISTORY — DX: Chronic obstructive pulmonary disease, unspecified: J44.9

## 2016-07-07 HISTORY — DX: Sleep apnea, unspecified: G47.30

## 2016-07-07 LAB — CBC WITH DIFFERENTIAL/PLATELET
Basophils Absolute: 0 10*3/uL (ref 0.0–0.1)
Basophils Relative: 0 %
Eosinophils Absolute: 0.4 10*3/uL (ref 0.0–0.7)
Eosinophils Relative: 4 %
HCT: 38.9 % — ABNORMAL LOW (ref 39.0–52.0)
Hemoglobin: 12.4 g/dL — ABNORMAL LOW (ref 13.0–17.0)
Lymphocytes Relative: 37 %
Lymphs Abs: 3.3 10*3/uL (ref 0.7–4.0)
MCH: 30.6 pg (ref 26.0–34.0)
MCHC: 31.9 g/dL (ref 30.0–36.0)
MCV: 96 fL (ref 78.0–100.0)
Monocytes Absolute: 0.8 10*3/uL (ref 0.1–1.0)
Monocytes Relative: 9 %
Neutro Abs: 4.5 10*3/uL (ref 1.7–7.7)
Neutrophils Relative %: 50 %
Platelets: 333 10*3/uL (ref 150–400)
RBC: 4.05 MIL/uL — ABNORMAL LOW (ref 4.22–5.81)
RDW: 15.4 % (ref 11.5–15.5)
WBC: 9 10*3/uL (ref 4.0–10.5)

## 2016-07-07 LAB — COMPREHENSIVE METABOLIC PANEL
ALT: 20 U/L (ref 17–63)
AST: 23 U/L (ref 15–41)
Albumin: 4 g/dL (ref 3.5–5.0)
Alkaline Phosphatase: 39 U/L (ref 38–126)
Anion gap: 8 (ref 5–15)
BUN: 16 mg/dL (ref 6–20)
CO2: 28 mmol/L (ref 22–32)
Calcium: 9.2 mg/dL (ref 8.9–10.3)
Chloride: 99 mmol/L — ABNORMAL LOW (ref 101–111)
Creatinine, Ser: 1.08 mg/dL (ref 0.61–1.24)
GFR calc Af Amer: >60 mL/min (ref >60)
GFR calc non Af Amer: >60 mL/min (ref >60)
Glucose, Bld: 161 mg/dL — ABNORMAL HIGH (ref 65–99)
Potassium: 4.7 mmol/L (ref 3.5–5.1)
Sodium: 135 mmol/L (ref 135–145)
Total Bilirubin: 0.6 mg/dL (ref 0.3–1.2)
Total Protein: 7.3 g/dL (ref 6.5–8.1)

## 2016-07-07 LAB — ABO/RH: ABO/RH(D): O POS

## 2016-07-07 LAB — PREPARE RBC (CROSSMATCH)

## 2016-07-11 ENCOUNTER — Inpatient Hospital Stay (HOSPITAL_COMMUNITY)
Admission: RE | Admit: 2016-07-11 | Discharge: 2016-07-19 | DRG: 330 | Disposition: A | Discharge status: Home / Self Care | Payer: Medicare Other | Source: Ambulatory Visit | Attending: General Surgery | Admitting: General Surgery

## 2016-07-11 ENCOUNTER — Inpatient Hospital Stay (HOSPITAL_COMMUNITY): Payer: Medicare Other | Admitting: Anesthesiology

## 2016-07-11 ENCOUNTER — Encounter (HOSPITAL_COMMUNITY)
Admission: RE | Disposition: A | Discharge status: Home / Self Care | Payer: Self-pay | Source: Ambulatory Visit | Attending: General Surgery

## 2016-07-11 ENCOUNTER — Encounter (HOSPITAL_COMMUNITY): Payer: Self-pay | Admitting: *Deleted

## 2016-07-11 DIAGNOSIS — E876 Hypokalemia: Secondary | ICD-10-CM | POA: Diagnosis not present

## 2016-07-11 DIAGNOSIS — G4730 Sleep apnea, unspecified: Secondary | ICD-10-CM | POA: Diagnosis present

## 2016-07-11 DIAGNOSIS — K219 Gastro-esophageal reflux disease without esophagitis: Secondary | ICD-10-CM | POA: Diagnosis present

## 2016-07-11 DIAGNOSIS — E7800 Pure hypercholesterolemia, unspecified: Secondary | ICD-10-CM | POA: Diagnosis present

## 2016-07-11 DIAGNOSIS — Z888 Allergy status to other drugs, medicaments and biological substances status: Secondary | ICD-10-CM

## 2016-07-11 DIAGNOSIS — C184 Malignant neoplasm of transverse colon: Secondary | ICD-10-CM

## 2016-07-11 DIAGNOSIS — Z96659 Presence of unspecified artificial knee joint: Secondary | ICD-10-CM | POA: Diagnosis present

## 2016-07-11 DIAGNOSIS — Z0189 Encounter for other specified special examinations: Secondary | ICD-10-CM

## 2016-07-11 DIAGNOSIS — Z79899 Other long term (current) drug therapy: Secondary | ICD-10-CM | POA: Diagnosis not present

## 2016-07-11 DIAGNOSIS — K567 Ileus, unspecified: Secondary | ICD-10-CM | POA: Diagnosis not present

## 2016-07-11 DIAGNOSIS — Z7982 Long term (current) use of aspirin: Secondary | ICD-10-CM

## 2016-07-11 DIAGNOSIS — Z952 Presence of prosthetic heart valve: Secondary | ICD-10-CM

## 2016-07-11 DIAGNOSIS — I10 Essential (primary) hypertension: Secondary | ICD-10-CM | POA: Diagnosis present

## 2016-07-11 DIAGNOSIS — J449 Chronic obstructive pulmonary disease, unspecified: Secondary | ICD-10-CM | POA: Diagnosis present

## 2016-07-11 DIAGNOSIS — Z7984 Long term (current) use of oral hypoglycemic drugs: Secondary | ICD-10-CM | POA: Diagnosis not present

## 2016-07-11 DIAGNOSIS — Z9049 Acquired absence of other specified parts of digestive tract: Secondary | ICD-10-CM

## 2016-07-11 DIAGNOSIS — E119 Type 2 diabetes mellitus without complications: Secondary | ICD-10-CM | POA: Diagnosis present

## 2016-07-11 DIAGNOSIS — Z86711 Personal history of pulmonary embolism: Secondary | ICD-10-CM | POA: Diagnosis not present

## 2016-07-11 DIAGNOSIS — Z87891 Personal history of nicotine dependence: Secondary | ICD-10-CM | POA: Diagnosis not present

## 2016-07-11 DIAGNOSIS — Z880 Allergy status to penicillin: Secondary | ICD-10-CM | POA: Diagnosis not present

## 2016-07-11 DIAGNOSIS — Z86718 Personal history of other venous thrombosis and embolism: Secondary | ICD-10-CM

## 2016-07-11 HISTORY — PX: PARTIAL COLECTOMY: SHX5273

## 2016-07-11 LAB — GLUCOSE, CAPILLARY
Glucose-Capillary: 139 mg/dL — ABNORMAL HIGH (ref 65–99)
Glucose-Capillary: 184 mg/dL — ABNORMAL HIGH (ref 65–99)
Glucose-Capillary: 164 mg/dL — ABNORMAL HIGH (ref 65–99)
Glucose-Capillary: 148 mg/dL — ABNORMAL HIGH (ref 65–99)
Glucose-Capillary: 161 mg/dL — ABNORMAL HIGH (ref 65–99)

## 2016-07-11 SURGERY — COLECTOMY, PARTIAL
Anesthesia: General | Site: Abdomen

## 2016-07-11 MED ORDER — 0.9 % SODIUM CHLORIDE (POUR BTL) OPTIME
TOPICAL | Status: DC | PRN
  Administered 2016-07-11: 1000 mL
  Administered 2016-07-11: 2000 mL

## 2016-07-11 MED ORDER — POVIDONE-IODINE 10 % EX OINT
TOPICAL_OINTMENT | CUTANEOUS | Status: AC
  Filled 2016-07-11: qty 1

## 2016-07-11 MED ORDER — NEOSTIGMINE METHYLSULFATE 10 MG/10ML IV SOLN
INTRAVENOUS | Status: AC
  Filled 2016-07-11: qty 3

## 2016-07-11 MED ORDER — DIPHENHYDRAMINE HCL 50 MG/ML IJ SOLN: 12.5000 mg | Freq: Four times a day (QID) | INTRAMUSCULAR | Status: DC | PRN

## 2016-07-11 MED ORDER — ROCURONIUM BROMIDE 50 MG/5ML IV SOLN
INTRAVENOUS | Status: AC
  Filled 2016-07-11: qty 1

## 2016-07-11 MED ORDER — FENTANYL CITRATE (PF) 100 MCG/2ML IJ SOLN
INTRAMUSCULAR | Status: DC | PRN
  Administered 2016-07-11 (×7): 50 ug via INTRAVENOUS

## 2016-07-11 MED ORDER — ATORVASTATIN CALCIUM 10 MG PO TABS
10.0000 mg | ORAL_TABLET | Freq: Every day | ORAL | Status: DC
  Administered 2016-07-11 – 2016-07-13 (×3): 10 mg via ORAL
  Filled 2016-07-11 (×4): qty 1

## 2016-07-11 MED ORDER — BUPIVACAINE LIPOSOME 1.3 % IJ SUSP
INTRAMUSCULAR | Status: DC | PRN
  Administered 2016-07-11: 20 mL

## 2016-07-11 MED ORDER — EPHEDRINE SULFATE 50 MG/ML IJ SOLN
INTRAMUSCULAR | Status: AC
  Filled 2016-07-11: qty 2

## 2016-07-11 MED ORDER — PROPOFOL 10 MG/ML IV BOLUS
INTRAVENOUS | Status: AC
  Filled 2016-07-11: qty 20

## 2016-07-11 MED ORDER — KETOROLAC TROMETHAMINE 30 MG/ML IJ SOLN
30.0000 mg | Freq: Once | INTRAMUSCULAR | Status: AC
  Administered 2016-07-11: 30 mg via INTRAVENOUS
  Filled 2016-07-11: qty 1

## 2016-07-11 MED ORDER — BUPIVACAINE LIPOSOME 1.3 % IJ SUSP
INTRAMUSCULAR | Status: AC
  Filled 2016-07-11: qty 20

## 2016-07-11 MED ORDER — INSULIN ASPART 100 UNIT/ML ~~LOC~~ SOLN
0.0000 [IU] | Freq: Three times a day (TID) | SUBCUTANEOUS | Status: DC
  Administered 2016-07-11: 2 [IU] via SUBCUTANEOUS
  Administered 2016-07-12: 3 [IU] via SUBCUTANEOUS
  Administered 2016-07-12: 2 [IU] via SUBCUTANEOUS
  Administered 2016-07-12: 3 [IU] via SUBCUTANEOUS
  Administered 2016-07-13: 5 [IU] via SUBCUTANEOUS
  Administered 2016-07-13: 2 [IU] via SUBCUTANEOUS
  Administered 2016-07-13: 5 [IU] via SUBCUTANEOUS
  Administered 2016-07-14 – 2016-07-17 (×12): 3 [IU] via SUBCUTANEOUS
  Administered 2016-07-18 (×2): 2 [IU] via SUBCUTANEOUS
  Administered 2016-07-18: 3 [IU] via SUBCUTANEOUS
  Administered 2016-07-19: 2 [IU] via SUBCUTANEOUS

## 2016-07-11 MED ORDER — LACTATED RINGERS IV SOLN
INTRAVENOUS | Status: DC
  Administered 2016-07-11 (×2): via INTRAVENOUS

## 2016-07-11 MED ORDER — CHLORHEXIDINE GLUCONATE CLOTH 2 % EX PADS: 6.0000 | MEDICATED_PAD | Freq: Once | CUTANEOUS | Status: DC

## 2016-07-11 MED ORDER — METRONIDAZOLE IN NACL 5-0.79 MG/ML-% IV SOLN
500.0000 mg | INTRAVENOUS | Status: AC
  Administered 2016-07-11: 500 mg via INTRAVENOUS
  Filled 2016-07-11: qty 100

## 2016-07-11 MED ORDER — FENTANYL CITRATE (PF) 100 MCG/2ML IJ SOLN
INTRAMUSCULAR | Status: AC
  Filled 2016-07-11: qty 2

## 2016-07-11 MED ORDER — MIDAZOLAM HCL 2 MG/2ML IJ SOLN
1.0000 mg | INTRAMUSCULAR | Status: DC
  Administered 2016-07-11: 2 mg via INTRAVENOUS

## 2016-07-11 MED ORDER — SUCCINYLCHOLINE CHLORIDE 20 MG/ML IJ SOLN
INTRAMUSCULAR | Status: AC
  Filled 2016-07-11: qty 1

## 2016-07-11 MED ORDER — DIPHENHYDRAMINE HCL 12.5 MG/5ML PO ELIX: 12.5000 mg | ORAL_SOLUTION | Freq: Four times a day (QID) | ORAL | Status: DC | PRN

## 2016-07-11 MED ORDER — ALVIMOPAN 12 MG PO CAPS
12.0000 mg | ORAL_CAPSULE | Freq: Two times a day (BID) | ORAL | Status: DC
  Administered 2016-07-12 – 2016-07-15 (×7): 12 mg via ORAL
  Filled 2016-07-11 (×7): qty 1

## 2016-07-11 MED ORDER — ONDANSETRON 4 MG PO TBDP
4.0000 mg | ORAL_TABLET | Freq: Four times a day (QID) | ORAL | Status: DC | PRN
  Administered 2016-07-14: 4 mg via ORAL
  Filled 2016-07-11: qty 1

## 2016-07-11 MED ORDER — SIMETHICONE 80 MG PO CHEW
40.0000 mg | CHEWABLE_TABLET | Freq: Four times a day (QID) | ORAL | Status: DC | PRN
  Administered 2016-07-12 – 2016-07-14 (×4): 40 mg via ORAL
  Filled 2016-07-11 (×4): qty 1

## 2016-07-11 MED ORDER — METOPROLOL TARTRATE 50 MG PO TABS
50.0000 mg | ORAL_TABLET | Freq: Two times a day (BID) | ORAL | Status: DC
Start: 2016-07-11 — End: 2016-07-15
  Administered 2016-07-11 – 2016-07-15 (×8): 50 mg via ORAL
  Filled 2016-07-11 (×8): qty 1

## 2016-07-11 MED ORDER — GLYCOPYRROLATE 0.2 MG/ML IJ SOLN
INTRAMUSCULAR | Status: DC | PRN
  Administered 2016-07-11: .5 mg via INTRAVENOUS

## 2016-07-11 MED ORDER — GLYCOPYRROLATE 0.2 MG/ML IJ SOLN
INTRAMUSCULAR | Status: AC
  Filled 2016-07-11: qty 5

## 2016-07-11 MED ORDER — NEOSTIGMINE METHYLSULFATE 10 MG/10ML IV SOLN
INTRAVENOUS | Status: DC | PRN
  Administered 2016-07-11: 3 mg via INTRAVENOUS

## 2016-07-11 MED ORDER — HYDROCODONE-ACETAMINOPHEN 5-325 MG PO TABS
1.0000 | ORAL_TABLET | ORAL | Status: DC | PRN
  Administered 2016-07-12: 2 via ORAL
  Administered 2016-07-12 – 2016-07-13 (×2): 1 via ORAL
  Administered 2016-07-13: 2 via ORAL
  Filled 2016-07-11: qty 1
  Filled 2016-07-11 (×2): qty 2
  Filled 2016-07-11: qty 1

## 2016-07-11 MED ORDER — ONDANSETRON HCL 4 MG/2ML IJ SOLN
4.0000 mg | Freq: Once | INTRAMUSCULAR | Status: AC
  Administered 2016-07-11: 4 mg via INTRAVENOUS

## 2016-07-11 MED ORDER — SEVOFLURANE IN SOLN
RESPIRATORY_TRACT | Status: AC
  Filled 2016-07-11: qty 250

## 2016-07-11 MED ORDER — MIDAZOLAM HCL 2 MG/2ML IJ SOLN
INTRAMUSCULAR | Status: AC
  Filled 2016-07-11: qty 2

## 2016-07-11 MED ORDER — ROCURONIUM 10MG/ML (10ML) SYRINGE FOR MEDFUSION PUMP - OPTIME
INTRAVENOUS | Status: DC | PRN
  Administered 2016-07-11: 40 mg via INTRAVENOUS
  Administered 2016-07-11: 10 mg via INTRAVENOUS

## 2016-07-11 MED ORDER — LACTATED RINGERS IV SOLN
INTRAVENOUS | Status: DC
  Administered 2016-07-11 (×3): via INTRAVENOUS

## 2016-07-11 MED ORDER — HYDROMORPHONE HCL 1 MG/ML IJ SOLN
1.0000 mg | INTRAMUSCULAR | Status: DC | PRN
  Administered 2016-07-11: 1 mg via INTRAVENOUS
  Filled 2016-07-11: qty 1

## 2016-07-11 MED ORDER — ONDANSETRON HCL 4 MG/2ML IJ SOLN
4.0000 mg | Freq: Four times a day (QID) | INTRAMUSCULAR | Status: DC | PRN
  Administered 2016-07-14 – 2016-07-15 (×3): 4 mg via INTRAVENOUS
  Filled 2016-07-11 (×4): qty 2

## 2016-07-11 MED ORDER — LIDOCAINE HCL (PF) 1 % IJ SOLN
INTRAMUSCULAR | Status: AC
  Filled 2016-07-11: qty 5

## 2016-07-11 MED ORDER — ONDANSETRON HCL 4 MG/2ML IJ SOLN
INTRAMUSCULAR | Status: AC
  Filled 2016-07-11: qty 2

## 2016-07-11 MED ORDER — ENOXAPARIN SODIUM 40 MG/0.4ML ~~LOC~~ SOLN
40.0000 mg | SUBCUTANEOUS | Status: DC
  Administered 2016-07-12 – 2016-07-19 (×8): 40 mg via SUBCUTANEOUS
  Filled 2016-07-11 (×8): qty 0.4

## 2016-07-11 MED ORDER — FENTANYL CITRATE (PF) 250 MCG/5ML IJ SOLN
INTRAMUSCULAR | Status: AC
  Filled 2016-07-11: qty 5

## 2016-07-11 MED ORDER — ACETAMINOPHEN 650 MG RE SUPP: 650.0000 mg | Freq: Four times a day (QID) | RECTAL | Status: DC | PRN

## 2016-07-11 MED ORDER — ARTIFICIAL TEARS OP OINT
TOPICAL_OINTMENT | OPHTHALMIC | Status: AC
  Filled 2016-07-11: qty 3.5

## 2016-07-11 MED ORDER — POVIDONE-IODINE 10 % OINT PACKET
TOPICAL_OINTMENT | CUTANEOUS | Status: DC | PRN
  Administered 2016-07-11: 1 via TOPICAL

## 2016-07-11 MED ORDER — CIPROFLOXACIN IN D5W 400 MG/200ML IV SOLN
400.0000 mg | INTRAVENOUS | Status: AC
Start: 2016-07-11 — End: 2016-07-11
  Administered 2016-07-11: 400 mg via INTRAVENOUS
  Filled 2016-07-11: qty 200

## 2016-07-11 MED ORDER — LIDOCAINE HCL (CARDIAC) 10 MG/ML IV SOLN
INTRAVENOUS | Status: DC | PRN
  Administered 2016-07-11: 50 mg via INTRAVENOUS

## 2016-07-11 MED ORDER — HYDROMORPHONE HCL 1 MG/ML IJ SOLN: 0.2500 mg | INTRAMUSCULAR | Status: DC | PRN

## 2016-07-11 MED ORDER — LISINOPRIL 5 MG PO TABS
5.0000 mg | ORAL_TABLET | Freq: Every day | ORAL | Status: DC
  Administered 2016-07-11 – 2016-07-18 (×6): 5 mg via ORAL
  Filled 2016-07-11 (×7): qty 1

## 2016-07-11 MED ORDER — ENOXAPARIN SODIUM 40 MG/0.4ML ~~LOC~~ SOLN
40.0000 mg | Freq: Once | SUBCUTANEOUS | Status: AC
  Administered 2016-07-11: 40 mg via SUBCUTANEOUS
  Filled 2016-07-11: qty 0.4

## 2016-07-11 MED ORDER — ALVIMOPAN 12 MG PO CAPS
12.0000 mg | ORAL_CAPSULE | Freq: Once | ORAL | Status: AC
  Administered 2016-07-11: 12 mg via ORAL
  Filled 2016-07-11: qty 1

## 2016-07-11 MED ORDER — PROPOFOL 10 MG/ML IV BOLUS
INTRAVENOUS | Status: DC | PRN
  Administered 2016-07-11: 100 mg via INTRAVENOUS

## 2016-07-11 MED ORDER — ACETAMINOPHEN 325 MG PO TABS: 650.0000 mg | ORAL_TABLET | Freq: Four times a day (QID) | ORAL | Status: DC | PRN

## 2016-07-11 MED ORDER — SODIUM CHLORIDE 0.9 % IJ SOLN
INTRAMUSCULAR | Status: AC
  Filled 2016-07-11: qty 10

## 2016-07-11 MED ORDER — LORAZEPAM 2 MG/ML IJ SOLN
0.5000 mg | INTRAMUSCULAR | Status: DC | PRN
  Administered 2016-07-15: 0.5 mg via INTRAVENOUS
  Filled 2016-07-11: qty 1

## 2016-07-11 MED ORDER — EPHEDRINE SULFATE 50 MG/ML IJ SOLN
INTRAMUSCULAR | Status: DC | PRN
  Administered 2016-07-11: 10 mg via INTRAVENOUS
  Administered 2016-07-11 (×2): 5 mg via INTRAVENOUS

## 2016-07-11 SURGICAL SUPPLY — 67 items
APPLIER CLIP 11 MED OPEN (CLIP)
APPLIER CLIP 13 LRG OPEN (CLIP)
BAG HAMPER (MISCELLANEOUS) ×3 IMPLANT
BARRIER SKIN 2 3/4 (OSTOMY) IMPLANT
BARRIER SKIN 2 3/4 INCH (OSTOMY)
CELLS DAT CNTRL 66122 CELL SVR (MISCELLANEOUS) IMPLANT
CHLORAPREP W/TINT 26ML (MISCELLANEOUS) ×3 IMPLANT
CLAMP POUCH DRAINAGE QUIET (OSTOMY) IMPLANT
CLIP APPLIE 11 MED OPEN (CLIP) IMPLANT
CLIP APPLIE 13 LRG OPEN (CLIP) IMPLANT
CLOTH BEACON ORANGE TIMEOUT ST (SAFETY) ×3 IMPLANT
COVER LIGHT HANDLE STERIS (MISCELLANEOUS) ×6 IMPLANT
COVER MAYO STAND XLG (DRAPE) ×3 IMPLANT
DRAPE UTILITY W/TAPE 26X15 (DRAPES) ×6 IMPLANT
DRAPE WARM FLUID 44X44 (DRAPE) ×3 IMPLANT
DRSG OPSITE POSTOP 4X10 (GAUZE/BANDAGES/DRESSINGS) ×3 IMPLANT
DRSG OPSITE POSTOP 4X8 (GAUZE/BANDAGES/DRESSINGS) IMPLANT
ELECT BLADE 6 FLAT ULTRCLN (ELECTRODE) ×3 IMPLANT
ELECT REM PT RETURN 9FT ADLT (ELECTROSURGICAL) ×3
ELECTRODE REM PT RTRN 9FT ADLT (ELECTROSURGICAL) ×1 IMPLANT
FORMALIN 10 PREFIL 480ML (MISCELLANEOUS) IMPLANT
GLOVE BIOGEL PI IND STRL 7.0 (GLOVE) ×4 IMPLANT
GLOVE BIOGEL PI INDICATOR 7.0 (GLOVE) ×8
GLOVE SURG SS PI 7.5 STRL IVOR (GLOVE) ×6 IMPLANT
GOWN STRL REUS W/ TWL XL LVL3 (GOWN DISPOSABLE) ×2 IMPLANT
GOWN STRL REUS W/TWL LRG LVL3 (GOWN DISPOSABLE) ×12 IMPLANT
GOWN STRL REUS W/TWL XL LVL3 (GOWN DISPOSABLE) ×4
HANDLE SUCTION POOLE (INSTRUMENTS) ×1 IMPLANT
INST SET MAJOR GENERAL (KITS) ×3 IMPLANT
KIT BLADEGUARD II DBL (SET/KITS/TRAYS/PACK) ×3 IMPLANT
KIT ROOM TURNOVER APOR (KITS) ×3 IMPLANT
LIGASURE IMPACT 36 18CM CVD LR (INSTRUMENTS) ×3 IMPLANT
MANIFOLD NEPTUNE II (INSTRUMENTS) ×3 IMPLANT
NEEDLE HYPO 18GX1.5 BLUNT FILL (NEEDLE) ×3 IMPLANT
NEEDLE HYPO 21X1.5 SAFETY (NEEDLE) ×3 IMPLANT
NS IRRIG 1000ML POUR BTL (IV SOLUTION) ×9 IMPLANT
PACK ABDOMINAL MAJOR (CUSTOM PROCEDURE TRAY) ×3 IMPLANT
PAD ARMBOARD 7.5X6 YLW CONV (MISCELLANEOUS) ×3 IMPLANT
PENCIL HANDSWITCHING (ELECTRODE) ×3 IMPLANT
POUCH OSTOMY 2 3/4  H 3804 (WOUND CARE)
POUCH OSTOMY 2 PC DRNBL 2.25 (WOUND CARE) IMPLANT
POUCH OSTOMY 2 PC DRNBL 2.75 (WOUND CARE) IMPLANT
POUCH OSTOMY DRNBL 2 1/4 (WOUND CARE)
RELOAD LINEAR CUT PROX 55 BLUE (ENDOMECHANICALS) IMPLANT
RELOAD PROXIMATE 75MM BLUE (ENDOMECHANICALS) ×6 IMPLANT
RETRACTOR WND ALEXIS 25 LRG (MISCELLANEOUS) ×1 IMPLANT
RTRCTR WOUND ALEXIS 18CM MED (MISCELLANEOUS)
RTRCTR WOUND ALEXIS 25CM LRG (MISCELLANEOUS) ×3
SET BASIN LINEN APH (SET/KITS/TRAYS/PACK) ×3 IMPLANT
SPONGE LAP 18X18 X RAY DECT (DISPOSABLE) ×3 IMPLANT
STAPLER GUN LINEAR PROX 60 (STAPLE) ×3 IMPLANT
STAPLER PROXIMATE 55 BLUE (STAPLE) IMPLANT
STAPLER PROXIMATE 75MM BLUE (STAPLE) ×3 IMPLANT
STAPLER VISISTAT (STAPLE) ×3 IMPLANT
SUCTION POOLE HANDLE (INSTRUMENTS) ×3
SUCTION YANKAUER HANDLE (MISCELLANEOUS) ×3 IMPLANT
SUT CHROMIC 0 SH (SUTURE) IMPLANT
SUT CHROMIC 2 0 SH (SUTURE) ×3 IMPLANT
SUT CHROMIC 3 0 SH 27 (SUTURE) IMPLANT
SUT NOVA NAB GS-26 0 60 (SUTURE) IMPLANT
SUT PDS AB 0 CTX 60 (SUTURE) ×6 IMPLANT
SUT SILK 2 0 (SUTURE)
SUT SILK 2-0 18XBRD TIE 12 (SUTURE) IMPLANT
SUT SILK 3 0 SH CR/8 (SUTURE) ×3 IMPLANT
SYR 20CC LL (SYRINGE) ×3 IMPLANT
TOWEL OR 17X26 4PK STRL BLUE (TOWEL DISPOSABLE) ×3 IMPLANT
TRAY FOLEY CATH SILVER 16FR (SET/KITS/TRAYS/PACK) ×3 IMPLANT

## 2016-07-11 NOTE — Op Note (Signed)
Patient:  Kevin Kirk  DOB:  03/20/1933  MRN:  [id number]   Preop Diagnosis:  Colon carcinoma  Postop Diagnosis:  Same  Procedure:  Partial colectomy  Surgeon:  Aviva Signs, M.D.  Anes:  Gen. endotracheal  Indications:  Patient is an 83 year old white male who was found on colonoscopy to have a proximal transverse colon carcinoma. The risks and benefits of the procedure including bleeding, infection, cardiopulmonary difficulties, anastomotic leak, and the possibility of a blood transfusion were fully explained to the patient, who gave informed consent.  Procedure note:  The patient was placed in supine position. After induction of general endotracheal anesthesia, the abdomen was prepped and draped using the usual sterile technique with ChloraPrep. Surgical site confirmation was performed.  A midline incision was made from above the umbilicus to just below the umbilicus. The peritoneal cavity was entered into without difficulty. The liver was palpated and no abnormal lesions were noted. On palpation of the colon, the colon mass was noted in the mid part of the colon. It was elected to proceed with a right hemicolectomy. The right colon was mobilized along its peritoneal reflection. The colon hepatic flexure was taken down using the LigaSure. A GIA stapler was placed across the terminal ileum and fired. This was likewise done across the mid transverse colon, distal to the abnormal lesions. The mesentery was then divided using the LigaSure. Care was taken to avoid the right ureter. The specimen was then removed from the operative field and sent to pathology. A side to side enterocolic anastomosis was performed using a GIA-75 stapler. The enterotomy was closed using a TA 60 stapler. The staple line was bolstered using 3-0 silk sutures. Surrounding omentum was placed over the anastomosis and secured into place using a 3-0 silk suture. The mesenteric defect was closed using a 2-0 chromic catgut  running suture. The abdominal cavity was then copiously irrigated with normal saline. No abnormal bleeding was noted at the end of the procedure. The bowel was returned into the abdominal cavity and orderly fashion. All operating personnel been changed her gown and gloves. A new setup was used.  The fascia was reapproximated using a looped 0 PDS running suture. Subcutaneous layer was irrigated with normal saline. The skin was injected with Exparel. The skin was closed using staples. Betadine ointment and a dry sterile dressing were applied.  All tape and needle counts were correct at the end of the procedure. The patient was extubated in the operating room and transferred to PACU in stable condition.  Complications:  None  EBL:  150 mL  Specimen:  Right colon

## 2016-07-11 NOTE — Anesthesia Postprocedure Evaluation (Signed)
Anesthesia Post Note  Patient: Kevin Kirk  Procedure(s) Performed: Procedure(s) (LRB): PARTIAL COLECTOMY (N/A)  Patient location during evaluation: PACU Anesthesia Type: General Level of consciousness: awake, oriented and patient cooperative Pain management: pain level controlled Vital Signs Assessment: post-procedure vital signs reviewed and stable Respiratory status: spontaneous breathing Cardiovascular status: stable Postop Assessment: no signs of nausea or vomiting Anesthetic complications: no     Last Vitals:  Vitals:   07/11/16 0945 07/11/16 1000  BP: (!) 153/79 (!) 151/80  Pulse: 71 71  Resp: 14 15  Temp:      Last Pain:  Vitals:   07/11/16 0934  TempSrc:   PainSc: Asleep                 ADAMS, AMY A

## 2016-07-11 NOTE — Anesthesia Procedure Notes (Signed)
Procedure Name: Intubation Date/Time: 07/11/2016 8:41 AM Performed by: Tressie Stalker E Pre-anesthesia Checklist: Patient identified, Patient being monitored, Timeout performed, Emergency Drugs available and Suction available Patient Re-evaluated:Patient Re-evaluated prior to inductionOxygen Delivery Method: Circle system utilized Preoxygenation: Pre-oxygenation with 100% oxygen Intubation Type: IV induction Ventilation: Mask ventilation without difficulty Laryngoscope Size: Mac and 3 Grade View: Grade I Tube type: Oral Tube size: 8.0 mm Number of attempts: 1 Airway Equipment and Method: Stylet Placement Confirmation: ETT inserted through vocal cords under direct vision,  positive ETCO2 and breath sounds checked- equal and bilateral Secured at: 23 cm Tube secured with: Tape Dental Injury: Teeth and Oropharynx as per pre-operative assessment

## 2016-07-11 NOTE — Transfer of Care (Signed)
Immediate Anesthesia Transfer of Care Note  Patient: Kevin Kirk  Procedure(s) Performed: Procedure(s): PARTIAL COLECTOMY (N/A)  Patient Location: PACU  Anesthesia Type:General  Level of Consciousness: drowsy  Airway & Oxygen Therapy: Patient Spontanous Breathing and Patient connected to face mask oxygen  Post-op Assessment: Report given to RN  Post vital signs: Reviewed and stable  Last Vitals:  Vitals:   07/11/16 0640  Temp: 36.6 C    Last Pain:  Vitals:   07/11/16 0640  TempSrc: Oral      Patients Stated Pain Goal: 7 (50/27/74 1287)  Complications: No apparent anesthesia complications

## 2016-07-11 NOTE — Anesthesia Preprocedure Evaluation (Signed)
Anesthesia Evaluation  Patient identified by MRN, date of birth, ID band Patient awake    Reviewed: Allergy & Precautions, NPO status , Patient's Chart, lab work & pertinent test results  Airway Mallampati: II  TM Distance: >3 FB Neck ROM: Full    Dental  (+) Partial Upper   Pulmonary sleep apnea , COPD, former smoker, PE   breath sounds clear to auscultation       Cardiovascular hypertension, Pt. on medications + DVT  + Valvular Problems/Murmurs (s/p AVR)  Rhythm:Regular Rate:Normal     Neuro/Psych    GI/Hepatic   Endo/Other  diabetes, Type 2, Oral Hypoglycemic Agents  Renal/GU      Musculoskeletal   Abdominal   Peds  Hematology   Anesthesia Other Findings   Reproductive/Obstetrics                             Anesthesia Physical Anesthesia Plan  ASA: III  Anesthesia Plan: General   Post-op Pain Management:    Induction: Intravenous  Airway Management Planned: Oral ETT  Additional Equipment:   Intra-op Plan:   Post-operative Plan: Extubation in OR  Informed Consent: I have reviewed the patients History and Physical, chart, labs and discussed the procedure including the risks, benefits and alternatives for the proposed anesthesia with the patient or authorized representative who has indicated his/her understanding and acceptance.     Plan Discussed with:   Anesthesia Plan Comments:         Anesthesia Quick Evaluation

## 2016-07-11 NOTE — Interval H&P Note (Signed)
History and Physical Interval Note:  07/11/2016 7:08 AM  Kevin Kirk  has presented today for surgery, with the diagnosis of colon cancer  The various methods of treatment have been discussed with the patient and family. After consideration of risks, benefits and other options for treatment, the patient has consented to  Procedure(s): PARTIAL COLECTOMY (N/A) as a surgical intervention .  The patient's history has been reviewed, patient examined, no change in status, stable for surgery.  I have reviewed the patient's chart and labs.  Questions were answered to the patient's satisfaction.     Aviva Signs

## 2016-07-12 LAB — CBC
HCT: 34.8 % — ABNORMAL LOW (ref 39.0–52.0)
Hemoglobin: 11.5 g/dL — ABNORMAL LOW (ref 13.0–17.0)
MCH: 31.3 pg (ref 26.0–34.0)
MCHC: 33 g/dL (ref 30.0–36.0)
MCV: 94.6 fL (ref 78.0–100.0)
Platelets: 287 10*3/uL (ref 150–400)
RBC: 3.68 MIL/uL — ABNORMAL LOW (ref 4.22–5.81)
RDW: 15 % (ref 11.5–15.5)
WBC: 12.9 10*3/uL — ABNORMAL HIGH (ref 4.0–10.5)

## 2016-07-12 LAB — GLUCOSE, CAPILLARY
Glucose-Capillary: 170 mg/dL — ABNORMAL HIGH (ref 65–99)
Glucose-Capillary: 188 mg/dL — ABNORMAL HIGH (ref 65–99)
Glucose-Capillary: 139 mg/dL — ABNORMAL HIGH (ref 65–99)
Glucose-Capillary: 140 mg/dL — ABNORMAL HIGH (ref 65–99)

## 2016-07-12 LAB — BASIC METABOLIC PANEL
Anion gap: 6 (ref 5–15)
BUN: 9 mg/dL (ref 6–20)
CO2: 27 mmol/L (ref 22–32)
Calcium: 8.4 mg/dL — ABNORMAL LOW (ref 8.9–10.3)
Chloride: 100 mmol/L — ABNORMAL LOW (ref 101–111)
Creatinine, Ser: 0.75 mg/dL (ref 0.61–1.24)
GFR calc Af Amer: >60 mL/min (ref >60)
GFR calc non Af Amer: >60 mL/min (ref >60)
Glucose, Bld: 157 mg/dL — ABNORMAL HIGH (ref 65–99)
Potassium: 3.7 mmol/L (ref 3.5–5.1)
Sodium: 133 mmol/L — ABNORMAL LOW (ref 135–145)

## 2016-07-12 LAB — PHOSPHORUS: Phosphorus: 2.9 mg/dL (ref 2.5–4.6)

## 2016-07-12 LAB — MAGNESIUM: Magnesium: 1.5 mg/dL — ABNORMAL LOW (ref 1.7–2.4)

## 2016-07-12 MED ORDER — MAGNESIUM SULFATE 2 GM/50ML IV SOLN
2.0000 g | Freq: Once | INTRAVENOUS | Status: AC
Start: 2016-07-12 — End: 2016-07-12
  Administered 2016-07-12: 2 g via INTRAVENOUS
  Filled 2016-07-12: qty 50

## 2016-07-12 MED ORDER — KCL IN DEXTROSE-NACL 20-5-0.45 MEQ/L-%-% IV SOLN
INTRAVENOUS | Status: DC
  Administered 2016-07-12 – 2016-07-15 (×4): via INTRAVENOUS

## 2016-07-12 NOTE — Addendum Note (Signed)
Addendum  created 07/12/16 2100 by Ollen Bowl, CRNA   Sign clinical note

## 2016-07-12 NOTE — Progress Notes (Signed)
1 Day Post-Op  Subjective: Patient has moderate incisional pain. No flatus or bowel movement yet.  Objective: Vital signs in last 24 hours: Temp:  [96.4 F (35.8 C)-100.4 F (38 C)] 100.4 F (38 C) (04/21 0700) Pulse Rate:  [63-88] 88 (04/21 0700) Resp:  [13-18] 18 (04/21 0700) BP: (101-153)/(50-80) 152/72 (04/21 0700) SpO2:  [95 %-100 %] 96 % (04/21 0700) Last BM Date: 07/11/16  Intake/Output from previous day: 04/20 0701 - 04/21 0700 In: 3676.3 [P.O.:300; I.V.:3376.3] Out: 875 [Urine:725; Blood:150] Intake/Output this shift: No intake/output data recorded.  General appearance: alert, cooperative and no distress Resp: clear to auscultation bilaterally Cardio: regular rate and rhythm, S1, S2 normal, no murmur, click, rub or gallop GI: Soft, dressings dry and intact. Occasional bowel sounds appreciated.  Lab Results:   Recent Labs  07/12/16 0632  WBC 12.9*  HGB 11.5*  HCT 34.8*  PLT 287   BMET  Recent Labs  07/12/16 0632  NA 133*  K 3.7  CL 100*  CO2 27  GLUCOSE 157*  BUN 9  CREATININE 0.75  CALCIUM 8.4*   PT/INR No results for input(s): LABPROT, INR in the last 72 hours.  Studies/Results: No results found.  Anti-infectives: Anti-infectives    Start     Dose/Rate Route Frequency Ordered Stop   07/11/16 0644  metroNIDAZOLE (FLAGYL) IVPB 500 mg     500 mg 100 mL/hr over 60 Minutes Intravenous On call to O.R. 07/11/16 2800 07/11/16 0740   07/11/16 0644  ciprofloxacin (CIPRO) IVPB 400 mg     400 mg 200 mL/hr over 60 Minutes Intravenous On call to O.R. 07/11/16 0644 07/11/16 0800      Assessment/Plan: s/p Procedure(s): PARTIAL COLECTOMY Impression: Stable on postoperative day 1. Hypomagnesemia will be addressed. We'll get patient out of bed. Await full return of bowel function.  LOS: 1 day    Aviva Signs 07/12/2016

## 2016-07-12 NOTE — Anesthesia Postprocedure Evaluation (Signed)
Anesthesia Post Note  Patient: Kevin Kirk  Procedure(s) Performed: Procedure(s) (LRB): PARTIAL COLECTOMY (N/A)  Patient location during evaluation: Nursing Unit Anesthesia Type: General Level of consciousness: awake and alert and oriented Pain management: pain level controlled Vital Signs Assessment: post-procedure vital signs reviewed and stable Respiratory status: spontaneous breathing Cardiovascular status: stable and blood pressure returned to baseline Postop Assessment: no signs of nausea or vomiting Anesthetic complications: no     Last Vitals:  Vitals:   07/12/16 1100 07/12/16 1700  BP: (!) 141/72 (!) 142/70  Pulse: 70 80  Resp: 18 16  Temp: 36.5 C 36.4 C    Last Pain:  Vitals:   07/12/16 2043  TempSrc:   PainSc: 7                  Avrohom Mckelvin

## 2016-07-13 LAB — MAGNESIUM: Magnesium: 1.7 mg/dL (ref 1.7–2.4)

## 2016-07-13 LAB — BASIC METABOLIC PANEL
Anion gap: 8 (ref 5–15)
BUN: 8 mg/dL (ref 6–20)
CO2: 25 mmol/L (ref 22–32)
Calcium: 8.6 mg/dL — ABNORMAL LOW (ref 8.9–10.3)
Chloride: 100 mmol/L — ABNORMAL LOW (ref 101–111)
Creatinine, Ser: 0.7 mg/dL (ref 0.61–1.24)
GFR calc Af Amer: >60 mL/min (ref >60)
GFR calc non Af Amer: >60 mL/min (ref >60)
Glucose, Bld: 192 mg/dL — ABNORMAL HIGH (ref 65–99)
Potassium: 4.1 mmol/L (ref 3.5–5.1)
Sodium: 133 mmol/L — ABNORMAL LOW (ref 135–145)

## 2016-07-13 LAB — CBC
HCT: 39.8 % (ref 39.0–52.0)
Hemoglobin: 13.1 g/dL (ref 13.0–17.0)
MCH: 31 pg (ref 26.0–34.0)
MCHC: 32.9 g/dL (ref 30.0–36.0)
MCV: 94.3 fL (ref 78.0–100.0)
Platelets: 296 10*3/uL (ref 150–400)
RBC: 4.22 MIL/uL (ref 4.22–5.81)
RDW: 14.8 % (ref 11.5–15.5)
WBC: 18 10*3/uL — ABNORMAL HIGH (ref 4.0–10.5)

## 2016-07-13 LAB — CEA: CEA: 2.6 ng/mL (ref 0.0–4.7)

## 2016-07-13 LAB — GLUCOSE, CAPILLARY
Glucose-Capillary: 203 mg/dL — ABNORMAL HIGH (ref 65–99)
Glucose-Capillary: 202 mg/dL — ABNORMAL HIGH (ref 65–99)
Glucose-Capillary: 236 mg/dL — ABNORMAL HIGH (ref 65–99)

## 2016-07-13 LAB — PHOSPHORUS: Phosphorus: 2.5 mg/dL (ref 2.5–4.6)

## 2016-07-13 MED ORDER — MAGNESIUM HYDROXIDE 400 MG/5ML PO SUSP
30.0000 mL | Freq: Two times a day (BID) | ORAL | Status: DC
  Administered 2016-07-13 – 2016-07-17 (×6): 30 mL via ORAL
  Filled 2016-07-13 (×7): qty 30

## 2016-07-13 NOTE — Progress Notes (Signed)
2 Days Post-Op  Subjective: Patient is comfortable. Has been passing flatus, but no bowel movement yet.  Objective: Vital signs in last 24 hours: Temp:  [97.5 F (36.4 C)-98.8 F (37.1 C)] 97.7 F (36.5 C) (04/22 1761) Pulse Rate:  [70-90] 80 (04/22 0613) Resp:  [16-18] 16 (04/22 6073) BP: (141-158)/(70-78) 152/77 (04/22 0613) SpO2:  [92 %-96 %] 95 % (04/22 7106) Last BM Date: 07/12/16  Intake/Output from previous day: 04/21 0701 - 04/22 0700 In: 1562.5 [P.O.:480; I.V.:1082.5] Out: 600 [Urine:600] Intake/Output this shift: Total I/O In: 240 [P.O.:240] Out: -   General appearance: alert, cooperative and no distress Resp: clear to auscultation bilaterally Cardio: regular rate and rhythm, S1, S2 normal, no murmur, click, rub or gallop GI: Soft, mildly distended. Incision healing well. Minimal bowel sounds appreciated.  Lab Results:   Recent Labs  07/12/16 0632 07/13/16 0608  WBC 12.9* 18.0*  HGB 11.5* 13.1  HCT 34.8* 39.8  PLT 287 296   BMET  Recent Labs  07/12/16 0632 07/13/16 0608  NA 133* 133*  K 3.7 4.1  CL 100* 100*  CO2 27 25  GLUCOSE 157* 192*  BUN 9 8  CREATININE 0.75 0.70  CALCIUM 8.4* 8.6*   PT/INR No results for input(s): LABPROT, INR in the last 72 hours.  Studies/Results: No results found.  Anti-infectives: Anti-infectives    Start     Dose/Rate Route Frequency Ordered Stop   07/11/16 0644  metroNIDAZOLE (FLAGYL) IVPB 500 mg     500 mg 100 mL/hr over 60 Minutes Intravenous On call to O.R. 07/11/16 2694 07/11/16 0740   07/11/16 8546  ciprofloxacin (CIPRO) IVPB 400 mg     400 mg 200 mL/hr over 60 Minutes Intravenous On call to O.R. 07/11/16 0644 07/11/16 0800      Assessment/Plan: s/p Procedure(s): PARTIAL COLECTOMY Impression: Stable on postoperative day 2. Leukocytosis may be secondary to the surgery. No wound infection or respiratory difficulties noted. Awaiting return of bowel function. We will ambulate patient.  LOS: 2 days     Aviva Signs 07/13/2016

## 2016-07-13 NOTE — Progress Notes (Signed)
Patient ambulated hallways with supervision and RW.  approx 250 feet.  Tolerated well.

## 2016-07-13 NOTE — Progress Notes (Signed)
Patient vomited x1. Green material noted. Patient with no further c/o nausea and does not wish to take anything for nausea at this time.

## 2016-07-14 ENCOUNTER — Inpatient Hospital Stay (HOSPITAL_COMMUNITY): Payer: Medicare Other

## 2016-07-14 ENCOUNTER — Encounter (HOSPITAL_COMMUNITY): Payer: Self-pay | Admitting: General Surgery

## 2016-07-14 LAB — GLUCOSE, CAPILLARY
Glucose-Capillary: 169 mg/dL — ABNORMAL HIGH (ref 65–99)
Glucose-Capillary: 195 mg/dL — ABNORMAL HIGH (ref 65–99)
Glucose-Capillary: 165 mg/dL — ABNORMAL HIGH (ref 65–99)
Glucose-Capillary: 189 mg/dL — ABNORMAL HIGH (ref 65–99)
Glucose-Capillary: 221 mg/dL — ABNORMAL HIGH (ref 65–99)

## 2016-07-14 LAB — CBC
HCT: 40.1 % (ref 39.0–52.0)
Hemoglobin: 13.6 g/dL (ref 13.0–17.0)
MCH: 31.6 pg (ref 26.0–34.0)
MCHC: 33.9 g/dL (ref 30.0–36.0)
MCV: 93 fL (ref 78.0–100.0)
Platelets: 286 10*3/uL (ref 150–400)
RBC: 4.31 MIL/uL (ref 4.22–5.81)
RDW: 14.4 % (ref 11.5–15.5)
WBC: 21.3 10*3/uL — ABNORMAL HIGH (ref 4.0–10.5)

## 2016-07-14 LAB — CK TOTAL AND CKMB (NOT AT ARMC)
CK, MB: 4.5 ng/mL (ref 0.5–5.0)
Relative Index: INVALID (ref 0.0–2.5)
Total CK: 63 U/L (ref 49–397)

## 2016-07-14 LAB — TROPONIN I: Troponin I: <0.03 ng/mL (ref <0.03)

## 2016-07-14 MED ORDER — IOPAMIDOL (ISOVUE-300) INJECTION 61%
100.0000 mL | Freq: Once | INTRAVENOUS | Status: AC | PRN
  Administered 2016-07-14: 100 mL via INTRAVENOUS

## 2016-07-14 MED ORDER — ALUM & MAG HYDROXIDE-SIMETH 200-200-20 MG/5ML PO SUSP
30.0000 mL | ORAL | Status: DC | PRN
  Administered 2016-07-14 – 2016-07-17 (×5): 30 mL via ORAL
  Filled 2016-07-14 (×4): qty 30

## 2016-07-14 MED ORDER — IOPAMIDOL (ISOVUE-300) INJECTION 61%
INTRAVENOUS | Status: AC
  Administered 2016-07-14: 30 mL
  Filled 2016-07-14: qty 30

## 2016-07-14 MED ORDER — SODIUM CHLORIDE 0.9 % IV SOLN
1.0000 g | INTRAVENOUS | Status: DC
  Administered 2016-07-14 – 2016-07-18 (×5): 1 g via INTRAVENOUS
  Filled 2016-07-14 (×8): qty 1

## 2016-07-14 NOTE — Care Management Note (Signed)
Case Management Note  Patient Details  Name: Kevin Kirk MRN: [id number] Date of Birth: [birth year]  Subjective/Objective:  S/p 3 days partial colectomy. From home, ind with ALD's prior to surgery. Having episode of emesis. CXR and CT of abd today.            Action/Plan: CM following for needs.    Expected Discharge Date:  07/14/16               Expected Discharge Plan:  Home/Self Care  In-House Referral:     Discharge planning Services  CM Consult  Post Acute Care Choice:    Choice offered to:     DME Arranged:    DME Agency:     HH Arranged:    HH Agency:     Status of Service:  In process, will continue to follow  If discussed at Long Length of Stay Meetings, dates discussed:    Additional Comments:  Abhiram Criado, Chauncey Reading, RN 07/14/2016, 12:12 PM

## 2016-07-14 NOTE — Progress Notes (Signed)
Patient c/o of chest pain, VSS. Patient vomited brown material and continues to c/o chest pain. EKG obtained showing a change from pre-operative EKG. MD called and orders obtained for cardiac enzymes and telemetry.

## 2016-07-14 NOTE — Progress Notes (Signed)
Patient vomited a large amount of brown material. MD paged and orders given for sips of clear liquids.

## 2016-07-14 NOTE — Progress Notes (Signed)
3 Days Post-Op  Subjective: Patient had multiple episodes of emesis. He also had some substernal chest pain with reflux. He states he feels much better this morning. He denies any chest pain or shortness of breath.  Objective: Vital signs in last 24 hours: Temp:  [97.6 F (36.4 C)-98 F (36.7 C)] 97.8 F (36.6 C) (04/23 0550) Pulse Rate:  [79-107] 107 (04/23 0550) Resp:  [16-20] 20 (04/23 0550) BP: (138-159)/(77-90) 159/90 (04/23 0550) SpO2:  [90 %-96 %] 93 % (04/23 0550) Last BM Date: 07/12/16  Intake/Output from previous day: 04/22 0701 - 04/23 0700 In: 1677.5 [P.O.:480; I.V.:1197.5] Out: -  Intake/Output this shift: No intake/output data recorded.  General appearance: alert, cooperative and no distress Resp: clear to auscultation bilaterally Cardio: regular rate and rhythm, S1, S2 normal, no murmur, click, rub or gallop GI: Soft, slightly distended. Incision healing well. Occasional bowel sounds appreciated.  Lab Results:   Recent Labs  07/13/16 0608 07/14/16 0220  WBC 18.0* 21.3*  HGB 13.1 13.6  HCT 39.8 40.1  PLT 296 286   BMET  Recent Labs  07/12/16 0632 07/13/16 0608  NA 133* 133*  K 3.7 4.1  CL 100* 100*  CO2 27 25  GLUCOSE 157* 192*  BUN 9 8  CREATININE 0.75 0.70  CALCIUM 8.4* 8.6*   PT/INR No results for input(s): LABPROT, INR in the last 72 hours.  Studies/Results: No results found.  Anti-infectives: Anti-infectives    Start     Dose/Rate Route Frequency Ordered Stop   07/11/16 0644  metroNIDAZOLE (FLAGYL) IVPB 500 mg     500 mg 100 mL/hr over 60 Minutes Intravenous On call to O.R. 07/11/16 4970 07/11/16 0740   07/11/16 0644  ciprofloxacin (CIPRO) IVPB 400 mg     400 mg 200 mL/hr over 60 Minutes Intravenous On call to O.R. 07/11/16 0644 07/11/16 0800      Assessment/Plan: s/p Procedure(s): PARTIAL COLECTOMY Impression: Suspect bowel dysfunction. Patient does have a leukocytosis, thus will get CT scan of the abdomen. We'll also get  a chest x-ray. His cardiac status is stable and initial troponin was negative. Further management pending those results.  LOS: 3 days    Aviva Signs 07/14/2016

## 2016-07-15 ENCOUNTER — Inpatient Hospital Stay (HOSPITAL_COMMUNITY): Payer: Medicare Other

## 2016-07-15 LAB — TYPE AND SCREEN
ABO/RH(D): O POS
Antibody Screen: NEGATIVE
Unit division: 0
Unit division: 0

## 2016-07-15 LAB — BPAM RBC
Blood Product Expiration Date: 201805032359
Blood Product Expiration Date: 201805072359
ISSUE DATE / TIME: 201804240841
ISSUE DATE / TIME: 201804241036
Unit Type and Rh: 5100
Unit Type and Rh: 5100

## 2016-07-15 LAB — BASIC METABOLIC PANEL
Anion gap: 11 (ref 5–15)
BUN: 25 mg/dL — ABNORMAL HIGH (ref 6–20)
CO2: 33 mmol/L — ABNORMAL HIGH (ref 22–32)
Calcium: 8.6 mg/dL — ABNORMAL LOW (ref 8.9–10.3)
Chloride: 84 mmol/L — ABNORMAL LOW (ref 101–111)
Creatinine, Ser: 1.07 mg/dL (ref 0.61–1.24)
GFR calc Af Amer: >60 mL/min (ref >60)
GFR calc non Af Amer: >60 mL/min (ref >60)
Glucose, Bld: 211 mg/dL — ABNORMAL HIGH (ref 65–99)
Potassium: 3.7 mmol/L (ref 3.5–5.1)
Sodium: 128 mmol/L — ABNORMAL LOW (ref 135–145)

## 2016-07-15 LAB — CBC
HCT: 37.7 % — ABNORMAL LOW (ref 39.0–52.0)
Hemoglobin: 12.4 g/dL — ABNORMAL LOW (ref 13.0–17.0)
MCH: 30.5 pg (ref 26.0–34.0)
MCHC: 32.9 g/dL (ref 30.0–36.0)
MCV: 92.9 fL (ref 78.0–100.0)
Platelets: 325 10*3/uL (ref 150–400)
RBC: 4.06 MIL/uL — ABNORMAL LOW (ref 4.22–5.81)
RDW: 14.3 % (ref 11.5–15.5)
WBC: 16.5 10*3/uL — ABNORMAL HIGH (ref 4.0–10.5)

## 2016-07-15 LAB — GLUCOSE, CAPILLARY
Glucose-Capillary: 198 mg/dL — ABNORMAL HIGH (ref 65–99)
Glucose-Capillary: 184 mg/dL — ABNORMAL HIGH (ref 65–99)
Glucose-Capillary: 193 mg/dL — ABNORMAL HIGH (ref 65–99)
Glucose-Capillary: 145 mg/dL — ABNORMAL HIGH (ref 65–99)

## 2016-07-15 MED ORDER — METOPROLOL TARTRATE 5 MG/5ML IV SOLN
2.5000 mg | Freq: Four times a day (QID) | INTRAVENOUS | Status: DC
  Administered 2016-07-15 – 2016-07-19 (×15): 2.5 mg via INTRAVENOUS
  Filled 2016-07-15 (×15): qty 5

## 2016-07-15 MED ORDER — POTASSIUM CHLORIDE 2 MEQ/ML IV SOLN
INTRAVENOUS | Status: DC
  Filled 2016-07-15 (×3): qty 1000

## 2016-07-15 MED ORDER — BISACODYL 10 MG RE SUPP
10.0000 mg | Freq: Two times a day (BID) | RECTAL | Status: DC
  Administered 2016-07-15 – 2016-07-17 (×5): 10 mg via RECTAL
  Filled 2016-07-15 (×6): qty 1

## 2016-07-15 MED ORDER — KCL IN DEXTROSE-NACL 20-5-0.9 MEQ/L-%-% IV SOLN
INTRAVENOUS | Status: DC
  Administered 2016-07-15 (×2): via INTRAVENOUS

## 2016-07-15 NOTE — Progress Notes (Signed)
Approximately 1500, call from Dr Arnoldo Morale okay to hook pt NG tube to low, intermittent suction.  By 1600, two cannisters with 1900cc of green liquid emptied.  Pt has stated "I am starting to feel better." will continue to monitor.

## 2016-07-15 NOTE — Progress Notes (Addendum)
Patient awake most of the night with acid reflux, nausea, and hiccups. Mylanta, Zofran, and Ativan given per PRN orders. Patient states that he has not urinated since 1500 yesterday. Patients abdomen distended from groin up to rib area. Patient does not feel urge to urinate. Patient does not think that he can tolerate laying flat for bladder scan. Patient has been up in chair all shift. Earlier, patient had vomited large amount of green liquid with some substance. Family member stated that vomit filled two emesis bags. Only 200 ml of vomit was left for this RN to observe, but amount was at least double that amount. PRN medication improved patient's heartburn earlier and did allow him to sleep for a short time, but not much more per patient. Will continue to monitor.

## 2016-07-15 NOTE — Progress Notes (Signed)
4 Days Post-Op  Subjective: Patient still with nausea and vomiting. Has not voided.  Objective: Vital signs in last 24 hours: Temp:  [98.3 F (36.8 C)-98.5 F (36.9 C)] 98.5 F (36.9 C) (04/24 0457) Pulse Rate:  [95-105] 101 (04/24 0457) Resp:  [20] 20 (04/24 0457) BP: (107-145)/(66-89) 137/70 (04/24 0457) SpO2:  [91 %-92 %] 91 % (04/24 0457) Last BM Date: 07/12/16 (Per patient,BM very small.)  Intake/Output from previous day: 04/23 0701 - 04/24 0700 In: 1883.8 [P.O.:300; I.V.:1533.8; IV Piggyback:50] Out: -  Intake/Output this shift: No intake/output data recorded.  General appearance: alert, cooperative, fatigued and no distress Resp: clear to auscultation bilaterally Cardio: regular rate and rhythm, S1, S2 normal, no murmur, click, rub or gallop GI: Distended with minimal bowel sounds. Incision healing well. No rigidity noted.  Lab Results:   Recent Labs  07/14/16 0220 07/15/16 0500  WBC 21.3* 16.5*  HGB 13.6 12.4*  HCT 40.1 37.7*  PLT 286 325   BMET  Recent Labs  07/13/16 0608 07/15/16 0500  NA 133* 128*  K 4.1 3.7  CL 100* 84*  CO2 25 33*  GLUCOSE 192* 211*  BUN 8 25*  CREATININE 0.70 1.07  CALCIUM 8.6* 8.6*   PT/INR No results for input(s): LABPROT, INR in the last 72 hours.  Studies/Results: Dg Chest 2 View  Result Date: 07/14/2016 CLINICAL DATA:  Vomiting and chest pressure EXAM: CHEST  2 VIEW COMPARISON:  07/07/2016 FINDINGS: Cardiac shadow is stable. Postsurgical changes are again noted. Mild bibasilar atelectatic changes are seen. Free air is noted related to the recent surgery. There is also suggestion of a small amount of fluid within the abdominal cavity as well. No acute bony abnormality is seen. IMPRESSION: Free air consistent with the recent surgical history. An air-fluid level is noted on the right which may be related to a small amount of fluid within the abdomen. CT may be helpful for further evaluation. These results will be called to  the ordering clinician or representative by the Radiologist Assistant, and communication documented in the PACS or zVision Dashboard. Electronically Signed   By: Inez Catalina M.D.   On: 07/14/2016 10:36   Ct Abdomen Pelvis W Contrast  Result Date: 07/14/2016 CLINICAL DATA:  Post RIGHT hemicolectomy on 07/11/2016 for colon cancer, vomiting EXAM: CT ABDOMEN AND PELVIS WITH CONTRAST TECHNIQUE: Multidetector CT imaging of the abdomen and pelvis was performed using the standard protocol following bolus administration of intravenous contrast. Sagittal and coronal MPR images reconstructed from axial data set. CONTRAST:  81mL ISOVUE-300 IOPAMIDOL (ISOVUE-300) INJECTION 61% PO, 137mL ISOVUE-300 IOPAMIDOL (ISOVUE-300) INJECTION 61% IV COMPARISON:  06/20/2016 FINDINGS: Lower chest: Bibasilar atelectasis Hepatobiliary: Few beam hardening artifacts from ribs traverse liver. Suspected tiny dependent gallstones. No definite biliary dilatation. Pancreas: Normal appearance Spleen: Surgically absent. Probable small splenule LEFT upper quadrant 19 x 13 mm image 21. Adrenals/Urinary Tract: Adrenal glands, kidneys, ureters, and bladder normal appearance Stomach/Bowel: Ileocolic anastomosis at the proximal to mid transverse colon. Diffuse dilatation of small bowel loops and stomach likely reflecting ileus. No evidence of gastric outlet obstruction. Mild bowel wall thickening of the distal ileum at the anastomosis consistent with recent surgery. Sigmoid diverticulosis. Remaining residual colon unremarkable. Vascular/Lymphatic: Scattered pelvic phleboliths. Scattered atherosclerotic calcifications without aneurysm. IVC filter noted. No definite adenopathy. Reproductive: Enlarged prostate gland 6.7 x 5.5 x 5.8 cm. Seminal vesicles unremarkable. Other: Low-attenuation free primarily in the RIGHT gutter and perihepatic region though a small amount is also present in the pelvis dependently and  interloop. Free intraperitoneal air greatest  in RIGHT upper quadrant. These are likely related to recent surgery. No hernia. Musculoskeletal: Osteoarthritic changes of the RIGHT hip joint. Scattered degenerative disc and facet disease changes lower lumbar spine. IMPRESSION: Dilated stomach and small bowel loops question postoperative ileus. Free air and free fluid likely related to recent surgery. Distal colonic diverticulosis. Bibasilar atelectasis. Prostatic enlargement. Electronically Signed   By: Lavonia Dana M.D.   On: 07/14/2016 11:24    Anti-infectives: Anti-infectives    Start     Dose/Rate Route Frequency Ordered Stop   07/14/16 1200  ertapenem (INVANZ) 1 g in sodium chloride 0.9 % 50 mL IVPB     1 g 100 mL/hr over 30 Minutes Intravenous Every 24 hours 07/14/16 1117     07/11/16 0644  metroNIDAZOLE (FLAGYL) IVPB 500 mg     500 mg 100 mL/hr over 60 Minutes Intravenous On call to O.R. 07/11/16 6378 07/11/16 0740   07/11/16 5885  ciprofloxacin (CIPRO) IVPB 400 mg     400 mg 200 mL/hr over 60 Minutes Intravenous On call to O.R. 07/11/16 0644 07/11/16 0800      Assessment/Plan: s/p Procedure(s): PARTIAL COLECTOMY Impression: Postoperative ileus, difficulty voiding  Plan: We will try Dulcolax suppositories. Should patient continue to have emesis, we will place an NG tube. We'll also place a Foley due to difficulty with voiding.  LOS: 4 days    Aviva Signs 07/15/2016

## 2016-07-16 LAB — CBC
HCT: 35.7 % — ABNORMAL LOW (ref 39.0–52.0)
Hemoglobin: 11.5 g/dL — ABNORMAL LOW (ref 13.0–17.0)
MCH: 30.3 pg (ref 26.0–34.0)
MCHC: 32.2 g/dL (ref 30.0–36.0)
MCV: 93.9 fL (ref 78.0–100.0)
Platelets: 279 10*3/uL (ref 150–400)
RBC: 3.8 MIL/uL — ABNORMAL LOW (ref 4.22–5.81)
RDW: 14.4 % (ref 11.5–15.5)
WBC: 13.8 10*3/uL — ABNORMAL HIGH (ref 4.0–10.5)

## 2016-07-16 LAB — BASIC METABOLIC PANEL
Anion gap: 9 (ref 5–15)
BUN: 28 mg/dL — ABNORMAL HIGH (ref 6–20)
CO2: 41 mmol/L — ABNORMAL HIGH (ref 22–32)
Calcium: 8 mg/dL — ABNORMAL LOW (ref 8.9–10.3)
Chloride: 84 mmol/L — ABNORMAL LOW (ref 101–111)
Creatinine, Ser: 1.19 mg/dL (ref 0.61–1.24)
GFR calc Af Amer: >60 mL/min (ref >60)
GFR calc non Af Amer: 55 mL/min — ABNORMAL LOW (ref >60)
Glucose, Bld: 198 mg/dL — ABNORMAL HIGH (ref 65–99)
Potassium: 3.1 mmol/L — ABNORMAL LOW (ref 3.5–5.1)
Sodium: 134 mmol/L — ABNORMAL LOW (ref 135–145)

## 2016-07-16 LAB — GLUCOSE, CAPILLARY
Glucose-Capillary: 176 mg/dL — ABNORMAL HIGH (ref 65–99)
Glucose-Capillary: 172 mg/dL — ABNORMAL HIGH (ref 65–99)
Glucose-Capillary: 174 mg/dL — ABNORMAL HIGH (ref 65–99)
Glucose-Capillary: 175 mg/dL — ABNORMAL HIGH (ref 65–99)

## 2016-07-16 MED ORDER — MENTHOL 3 MG MT LOZG: 1.0000 | LOZENGE | OROMUCOSAL | Status: DC | PRN

## 2016-07-16 MED ORDER — KCL IN DEXTROSE-NACL 40-5-0.45 MEQ/L-%-% IV SOLN
INTRAVENOUS | Status: DC
  Administered 2016-07-16 – 2016-07-17 (×4): via INTRAVENOUS

## 2016-07-16 NOTE — Progress Notes (Signed)
5 Days Post-Op  Subjective: Patient comfortable after having NG tube placed. Have large bowel movement last night. No complaints of pain.  Objective: Vital signs in last 24 hours: Temp:  [98.3 F (36.8 C)-98.9 F (37.2 C)] 98.9 F (37.2 C) (04/25 0636) Pulse Rate:  [90-107] 90 (04/25 0636) Resp:  [18] 18 (04/25 0636) BP: (118-132)/(57-74) 119/57 (04/25 0636) SpO2:  [83 %-98 %] 98 % (04/25 0636) Last BM Date: 07/15/16  Intake/Output from previous day: 04/24 0701 - 04/25 0700 In: 1783.8 [I.V.:1733.8; IV Piggyback:50] Out: 3500 [Urine:1000; Emesis/NG output:2500] Intake/Output this shift: Total I/O In: 0  Out: 750 [Emesis/NG output:750]  General appearance: alert, cooperative and no distress Resp: clear to auscultation bilaterally Cardio: regular rate and rhythm, S1, S2 normal, no murmur, click, rub or gallop GI: Soft, incision healing well. No distention noted. Bowel sounds present.  Lab Results:   Recent Labs  07/15/16 0500 07/16/16 0541  WBC 16.5* 13.8*  HGB 12.4* 11.5*  HCT 37.7* 35.7*  PLT 325 279   BMET  Recent Labs  07/15/16 0500 07/16/16 0541  NA 128* 134*  K 3.7 3.1*  CL 84* 84*  CO2 33* 41*  GLUCOSE 211* 198*  BUN 25* 28*  CREATININE 1.07 1.19  CALCIUM 8.6* 8.0*   PT/INR No results for input(s): LABPROT, INR in the last 72 hours.  Studies/Results: Dg Chest 1 View  Result Date: 07/15/2016 CLINICAL DATA:  Nasogastric tube placement. EXAM: CHEST 1 VIEW COMPARISON:  07/14/2016 FINDINGS: Nasogastric tube enters the abdomen, passing at least to the body of the stomach. Free intraperitoneal air remains evident. Mild basilar atelectasis. IMPRESSION: Nasogastric tube enters the stomach, tip at least at the mid body. Some residual intraperitoneal air. Mild basilar atelectasis. Electronically Signed   By: Nelson Chimes M.D.   On: 07/15/2016 14:56   Dg Chest 2 View  Result Date: 07/14/2016 CLINICAL DATA:  Vomiting and chest pressure EXAM: CHEST  2 VIEW  COMPARISON:  07/07/2016 FINDINGS: Cardiac shadow is stable. Postsurgical changes are again noted. Mild bibasilar atelectatic changes are seen. Free air is noted related to the recent surgery. There is also suggestion of a small amount of fluid within the abdominal cavity as well. No acute bony abnormality is seen. IMPRESSION: Free air consistent with the recent surgical history. An air-fluid level is noted on the right which may be related to a small amount of fluid within the abdomen. CT may be helpful for further evaluation. These results will be called to the ordering clinician or representative by the Radiologist Assistant, and communication documented in the PACS or zVision Dashboard. Electronically Signed   By: Inez Catalina M.D.   On: 07/14/2016 10:36   Ct Abdomen Pelvis W Contrast  Result Date: 07/14/2016 CLINICAL DATA:  Post RIGHT hemicolectomy on 07/11/2016 for colon cancer, vomiting EXAM: CT ABDOMEN AND PELVIS WITH CONTRAST TECHNIQUE: Multidetector CT imaging of the abdomen and pelvis was performed using the standard protocol following bolus administration of intravenous contrast. Sagittal and coronal MPR images reconstructed from axial data set. CONTRAST:  61mL ISOVUE-300 IOPAMIDOL (ISOVUE-300) INJECTION 61% PO, 134mL ISOVUE-300 IOPAMIDOL (ISOVUE-300) INJECTION 61% IV COMPARISON:  06/20/2016 FINDINGS: Lower chest: Bibasilar atelectasis Hepatobiliary: Few beam hardening artifacts from ribs traverse liver. Suspected tiny dependent gallstones. No definite biliary dilatation. Pancreas: Normal appearance Spleen: Surgically absent. Probable small splenule LEFT upper quadrant 19 x 13 mm image 21. Adrenals/Urinary Tract: Adrenal glands, kidneys, ureters, and bladder normal appearance Stomach/Bowel: Ileocolic anastomosis at the proximal to mid transverse colon. Diffuse  dilatation of small bowel loops and stomach likely reflecting ileus. No evidence of gastric outlet obstruction. Mild bowel wall thickening of  the distal ileum at the anastomosis consistent with recent surgery. Sigmoid diverticulosis. Remaining residual colon unremarkable. Vascular/Lymphatic: Scattered pelvic phleboliths. Scattered atherosclerotic calcifications without aneurysm. IVC filter noted. No definite adenopathy. Reproductive: Enlarged prostate gland 6.7 x 5.5 x 5.8 cm. Seminal vesicles unremarkable. Other: Low-attenuation free primarily in the RIGHT gutter and perihepatic region though a small amount is also present in the pelvis dependently and interloop. Free intraperitoneal air greatest in RIGHT upper quadrant. These are likely related to recent surgery. No hernia. Musculoskeletal: Osteoarthritic changes of the RIGHT hip joint. Scattered degenerative disc and facet disease changes lower lumbar spine. IMPRESSION: Dilated stomach and small bowel loops question postoperative ileus. Free air and free fluid likely related to recent surgery. Distal colonic diverticulosis. Bibasilar atelectasis. Prostatic enlargement. Electronically Signed   By: Lavonia Dana M.D.   On: 07/14/2016 11:24    Anti-infectives: Anti-infectives    Start     Dose/Rate Route Frequency Ordered Stop   07/14/16 1200  ertapenem (INVANZ) 1 g in sodium chloride 0.9 % 50 mL IVPB     1 g 100 mL/hr over 30 Minutes Intravenous Every 24 hours 07/14/16 1117     07/11/16 0644  metroNIDAZOLE (FLAGYL) IVPB 500 mg     500 mg 100 mL/hr over 60 Minutes Intravenous On call to O.R. 07/11/16 9295 07/11/16 0740   07/11/16 7473  ciprofloxacin (CIPRO) IVPB 400 mg     400 mg 200 mL/hr over 60 Minutes Intravenous On call to O.R. 07/11/16 0644 07/11/16 0800      Assessment/Plan: s/p Procedure(s): PARTIAL COLECTOMY Impression: Bowel function starting to return. Will leave NG tube in today and reassess later. Final pathology shows a T3, N1, M0 adenocarcinoma of the colon. One out of 16 lymph nodes were positive. Patient and daughter made aware. Will treat hypokalemia.  LOS: 5 days     Aviva Signs 07/16/2016

## 2016-07-17 LAB — CBC
HCT: 36.6 % — ABNORMAL LOW (ref 39.0–52.0)
Hemoglobin: 11.9 g/dL — ABNORMAL LOW (ref 13.0–17.0)
MCH: 30.9 pg (ref 26.0–34.0)
MCHC: 32.5 g/dL (ref 30.0–36.0)
MCV: 95.1 fL (ref 78.0–100.0)
Platelets: 271 10*3/uL (ref 150–400)
RBC: 3.85 MIL/uL — ABNORMAL LOW (ref 4.22–5.81)
RDW: 14.5 % (ref 11.5–15.5)
WBC: 12.4 10*3/uL — ABNORMAL HIGH (ref 4.0–10.5)

## 2016-07-17 LAB — BASIC METABOLIC PANEL
Anion gap: 9 (ref 5–15)
BUN: 18 mg/dL (ref 6–20)
CO2: 36 mmol/L — ABNORMAL HIGH (ref 22–32)
Calcium: 8.2 mg/dL — ABNORMAL LOW (ref 8.9–10.3)
Chloride: 89 mmol/L — ABNORMAL LOW (ref 101–111)
Creatinine, Ser: 0.89 mg/dL (ref 0.61–1.24)
GFR calc Af Amer: >60 mL/min (ref >60)
GFR calc non Af Amer: >60 mL/min (ref >60)
Glucose, Bld: 186 mg/dL — ABNORMAL HIGH (ref 65–99)
Potassium: 3.5 mmol/L (ref 3.5–5.1)
Sodium: 134 mmol/L — ABNORMAL LOW (ref 135–145)

## 2016-07-17 LAB — GLUCOSE, CAPILLARY
Glucose-Capillary: 168 mg/dL — ABNORMAL HIGH (ref 65–99)
Glucose-Capillary: 188 mg/dL — ABNORMAL HIGH (ref 65–99)
Glucose-Capillary: 151 mg/dL — ABNORMAL HIGH (ref 65–99)
Glucose-Capillary: 125 mg/dL — ABNORMAL HIGH (ref 65–99)

## 2016-07-17 MED ORDER — METOCLOPRAMIDE HCL 5 MG/ML IJ SOLN
10.0000 mg | Freq: Four times a day (QID) | INTRAMUSCULAR | Status: AC
  Administered 2016-07-17 – 2016-07-19 (×8): 10 mg via INTRAVENOUS
  Filled 2016-07-17 (×8): qty 2

## 2016-07-17 NOTE — Progress Notes (Signed)
Pt ambulated approx. 200 feet in the hall. Tolerated well. Still continuing to pass gas. Will continue to monitor.

## 2016-07-17 NOTE — Care Management Note (Signed)
Case Management Note  Patient Details  Name: HASON OFARRELL MRN: [id number] Date of Birth: 20-Aug-1932  If discussed at Long Length of Stay Meetings, dates discussed:  07/17/2016  Additional Comments:  Emaleigh Guimond, Chauncey Reading, RN 07/17/2016, 10:26 AM

## 2016-07-17 NOTE — Progress Notes (Signed)
Pt had a solid, broken up into small pieces BM. Pt is also passing moderate amount of gas. Pt has been sitting up in chair most of am but is back in bed now. Will continue to monitor.

## 2016-07-17 NOTE — Progress Notes (Signed)
6 Days Post-Op  Subjective: Patient had another bowel movement yesterday evening. It is passing flatus.  Objective: Vital signs in last 24 hours: Temp:  [97.8 F (36.6 C)-98.9 F (37.2 C)] 97.8 F (36.6 C) (04/26 0547) Pulse Rate:  [94-101] 96 (04/26 0547) Resp:  [18-20] 20 (04/26 0547) BP: (118-121)/(64-71) 118/71 (04/26 0547) SpO2:  [97 %-98 %] 97 % (04/26 0547) Last BM Date: 07/16/16  Intake/Output from previous day: 04/25 0701 - 04/26 0700 In: 580 [I.V.:530; IV Piggyback:50] Out: 4450 [Urine:1300; Emesis/NG output:3150] Intake/Output this shift: Total I/O In: -  Out: 500 [Emesis/NG output:500]  General appearance: alert, cooperative and no distress Resp: clear to auscultation bilaterally Cardio: regular rate and rhythm, S1, S2 normal, no murmur, click, rub or gallop GI: Soft, no distention. Bowel sounds active. Incision healing well.  Lab Results:   Recent Labs  07/16/16 0541 07/17/16 0635  WBC 13.8* 12.4*  HGB 11.5* 11.9*  HCT 35.7* 36.6*  PLT 279 271   BMET  Recent Labs  07/16/16 0541 07/17/16 0635  NA 134* 134*  K 3.1* 3.5  CL 84* 89*  CO2 41* 36*  GLUCOSE 198* 186*  BUN 28* 18  CREATININE 1.19 0.89  CALCIUM 8.0* 8.2*   PT/INR No results for input(s): LABPROT, INR in the last 72 hours.  Studies/Results: Dg Chest 1 View  Result Date: 07/15/2016 CLINICAL DATA:  Nasogastric tube placement. EXAM: CHEST 1 VIEW COMPARISON:  07/14/2016 FINDINGS: Nasogastric tube enters the abdomen, passing at least to the body of the stomach. Free intraperitoneal air remains evident. Mild basilar atelectasis. IMPRESSION: Nasogastric tube enters the stomach, tip at least at the mid body. Some residual intraperitoneal air. Mild basilar atelectasis. Electronically Signed   By: Nelson Chimes M.D.   On: 07/15/2016 14:56    Anti-infectives: Anti-infectives    Start     Dose/Rate Route Frequency Ordered Stop   07/14/16 1200  ertapenem (INVANZ) 1 g in sodium chloride 0.9 %  50 mL IVPB     1 g 100 mL/hr over 30 Minutes Intravenous Every 24 hours 07/14/16 1117     07/11/16 0644  metroNIDAZOLE (FLAGYL) IVPB 500 mg     500 mg 100 mL/hr over 60 Minutes Intravenous On call to O.R. 07/11/16 7579 07/11/16 0740   07/11/16 7282  ciprofloxacin (CIPRO) IVPB 400 mg     400 mg 200 mL/hr over 60 Minutes Intravenous On call to O.R. 07/11/16 0644 07/11/16 0800      Assessment/Plan: s/p Procedure(s): PARTIAL COLECTOMY Impression: Bowel function has returned. There is some variability as to how much NG tube output he has had. He has had 2 bowel movements over the past 24 hours. He does have active bowel sounds. We will remove NG tube and start full liquid diet. We'll also start Reglan for short course. This was discussed with the patient's daughter.  LOS: 6 days    Aviva Signs 07/17/2016

## 2016-07-17 NOTE — Care Management Note (Signed)
Case Management Note  Patient Details  Name: Kevin Kirk MRN: [id number] Date of Birth: February 02, 1933  Subjective/Objective:   Adm from home alone, ind with ADL's.   Has cane and RW if needs. Wears CPAP at night. Has PCP, still drives to appointments. Reports no issues affording medications.                  Action/Plan: Plans to return home with self care.    Expected Discharge Date:  07/14/16               Expected Discharge Plan:  Home/Self Care  In-House Referral:     Discharge planning Services  CM Consult  Post Acute Care Choice:    Choice offered to:  NA  DME Arranged:    DME Agency:     HH Arranged:    HH Agency:     Status of Service:  Completed, signed off  If discussed at [employer] of Stay Meetings, dates discussed:    Additional Comments:  Nikia Mangino, Chauncey Reading, RN 07/17/2016, 12:00 PM

## 2016-07-18 LAB — BASIC METABOLIC PANEL
Anion gap: 8 (ref 5–15)
BUN: 14 mg/dL (ref 6–20)
CO2: 32 mmol/L (ref 22–32)
Calcium: 8.1 mg/dL — ABNORMAL LOW (ref 8.9–10.3)
Chloride: 94 mmol/L — ABNORMAL LOW (ref 101–111)
Creatinine, Ser: 0.79 mg/dL (ref 0.61–1.24)
GFR calc Af Amer: >60 mL/min (ref >60)
GFR calc non Af Amer: >60 mL/min (ref >60)
Glucose, Bld: 151 mg/dL — ABNORMAL HIGH (ref 65–99)
Potassium: 3.6 mmol/L (ref 3.5–5.1)
Sodium: 134 mmol/L — ABNORMAL LOW (ref 135–145)

## 2016-07-18 LAB — CBC
HCT: 35.8 % — ABNORMAL LOW (ref 39.0–52.0)
Hemoglobin: 11.7 g/dL — ABNORMAL LOW (ref 13.0–17.0)
MCH: 31 pg (ref 26.0–34.0)
MCHC: 32.7 g/dL (ref 30.0–36.0)
MCV: 94.7 fL (ref 78.0–100.0)
Platelets: 291 10*3/uL (ref 150–400)
RBC: 3.78 MIL/uL — ABNORMAL LOW (ref 4.22–5.81)
RDW: 14.3 % (ref 11.5–15.5)
WBC: 13.6 10*3/uL — ABNORMAL HIGH (ref 4.0–10.5)

## 2016-07-18 LAB — GLUCOSE, CAPILLARY
Glucose-Capillary: 148 mg/dL — ABNORMAL HIGH (ref 65–99)
Glucose-Capillary: 188 mg/dL — ABNORMAL HIGH (ref 65–99)
Glucose-Capillary: 150 mg/dL — ABNORMAL HIGH (ref 65–99)
Glucose-Capillary: 170 mg/dL — ABNORMAL HIGH (ref 65–99)

## 2016-07-18 MED ORDER — METFORMIN HCL 500 MG PO TABS
500.0000 mg | ORAL_TABLET | Freq: Two times a day (BID) | ORAL | Status: DC
  Administered 2016-07-18 – 2016-07-19 (×2): 500 mg via ORAL
  Filled 2016-07-18 (×2): qty 1

## 2016-07-18 NOTE — Progress Notes (Signed)
7 Days Post-Op  Subjective: Patient doing well. Denies any significant abdominal pain, nausea, vomiting. Has had another bowel movement since I last saw him.  Objective: Vital signs in last 24 hours: Temp:  [98 F (36.7 C)-98.8 F (37.1 C)] 98 F (36.7 C) (04/27 0605) Pulse Rate:  [82-92] 82 (04/27 0605) Resp:  [18] 18 (04/27 0605) BP: (108-111)/(51-71) 110/71 (04/27 0605) SpO2:  [94 %-95 %] 95 % (04/27 0605) Last BM Date: 07/17/16  Intake/Output from previous day: 04/26 0701 - 04/27 0700 In: 3891.3 [P.O.:600; I.V.:3291.3] Out: 1700 [Urine:1200; Emesis/NG output:500] Intake/Output this shift: No intake/output data recorded.  General appearance: alert, cooperative and no distress Resp: clear to auscultation bilaterally Cardio: regular rate and rhythm, S1, S2 normal, no murmur, click, rub or gallop GI: Soft, nondistended. Bowel sounds active. Incision healing well.  Lab Results:   Recent Labs  07/17/16 0635 07/18/16 0630  WBC 12.4* 13.6*  HGB 11.9* 11.7*  HCT 36.6* 35.8*  PLT 271 291   BMET  Recent Labs  07/17/16 0635 07/18/16 0630  NA 134* 134*  K 3.5 3.6  CL 89* 94*  CO2 36* 32  GLUCOSE 186* 151*  BUN 18 14  CREATININE 0.89 0.79  CALCIUM 8.2* 8.1*   PT/INR No results for input(s): LABPROT, INR in the last 72 hours.  Studies/Results: No results found.  Anti-infectives: Anti-infectives    Start     Dose/Rate Route Frequency Ordered Stop   07/14/16 1200  ertapenem (INVANZ) 1 g in sodium chloride 0.9 % 50 mL IVPB     1 g 100 mL/hr over 30 Minutes Intravenous Every 24 hours 07/14/16 1117     07/11/16 0644  metroNIDAZOLE (FLAGYL) IVPB 500 mg     500 mg 100 mL/hr over 60 Minutes Intravenous On call to O.R. 07/11/16 7124 07/11/16 0740   07/11/16 5809  ciprofloxacin (CIPRO) IVPB 400 mg     400 mg 200 mL/hr over 60 Minutes Intravenous On call to O.R. 07/11/16 0644 07/11/16 0800      Assessment/Plan: s/p Procedure(s): PARTIAL COLECTOMY Impression:  Bowel function has returned. Will advance to soft diet. We will restart Glucophage. Anticipate discharge in next 24-48 hours.  LOS: 7 days    Aviva Signs 07/18/2016

## 2016-07-18 NOTE — Care Management Important Message (Signed)
Important Message  Patient Details  Name: Kevin Kirk MRN: [id number] Date of Birth: 1932-08-01   Medicare Important Message Given:  Yes    Sherald Barge, RN 07/18/2016, 1:21 PM

## 2016-07-19 LAB — GLUCOSE, CAPILLARY: Glucose-Capillary: 137 mg/dL — ABNORMAL HIGH (ref 65–99)

## 2016-07-19 NOTE — Discharge Summary (Signed)
Physician Discharge Summary  Patient ID: Kevin Kirk MRN: [id number] DOB/AGE: [birth year] 83 y.o.  Admit date: 07/11/2016 Discharge date: 07/19/2016  Admission Diagnoses:Colon carcinoma  Discharge Diagnoses: Same, T3, N1, M0 adenocarcinoma of colon Active Problems:   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HCC)   S/P partial colectomy Hypertension, non-insulin-dependent diabetes mellitus  Discharged Condition: good  Hospital Course: Patient is an 83 year old white male who was found on colonoscopy to have an adenocarcinoma of the ascending colon. He underwent a right hemicolectomy on 07/11/2016. He tolerated the surgery well. His postoperative course was remarkable for an ileus which took some time to resolve. Once his bowel function returned, his diet was advanced without difficulty. Final pathology revealed a T3, N1, N0 adenocarcinoma of the colon. He is being discharged home on 07/19/2016 in good and improving condition.  Treatments: surgery: Right hemicolectomy on 07/11/2016  Discharge Exam: Blood pressure 130/67, pulse 100, temperature 98.9 F (37.2 C), temperature source Oral, resp. rate 18, height 6\' 1"  (1.854 m), SpO2 93 %. General appearance: alert, cooperative and no distress Resp: clear to auscultation bilaterally Cardio: regular rate and rhythm, S1, S2 normal, no murmur, click, rub or gallop GI: Soft, incision healing well. Bowel sounds active.  Disposition: 01-Home or Self Care  Discharge Instructions    Diet - low sodium heart healthy    Complete by:  As directed    Increase activity slowly    Complete by:  As directed      Allergies as of 07/19/2016      Reactions   Amoxicillin Hives   Has patient had a PCN reaction causing immediate rash, facial/tongue/throat swelling, SOB or lightheadedness with hypotension: No Has patient had a PCN reaction causing severe rash involving mucus membranes or skin necrosis: Yes Has patient had a PCN reaction that required  hospitalization No Has patient had a PCN reaction occurring within the last 10 years: No If all of the above answers are "NO", then may proceed with Cephalosporin use.   Prednisone    Pt does not want to take this medication    Tamsulosin Hcl    Cant sleep, confusion       Medication List    STOP taking these medications   metroNIDAZOLE 500 MG tablet Commonly known as:  FLAGYL   neomycin 500 MG tablet Commonly known as:  MYCIFRADIN     TAKE these medications   acetaminophen 500 MG tablet Commonly known as:  TYLENOL Take 500 mg by mouth daily as needed for moderate pain or headache.   aspirin EC 81 MG tablet Take 81 mg by mouth daily.   atorvastatin 10 MG tablet Commonly known as:  LIPITOR Take 10 mg by mouth at bedtime.   ferrous sulfate 325 (65 FE) MG tablet Take 325 mg by mouth daily with breakfast.   finasteride 5 MG tablet Commonly known as:  PROSCAR Take 5 mg by mouth at bedtime.   Glucosamine HCl 1000 MG Tabs Take 1,000 mg by mouth 2 (two) times daily.   lisinopril 5 MG tablet Commonly known as:  PRINIVIL,ZESTRIL Take 5 mg by mouth at bedtime.   MAGNESIUM PO Take 265 mg by mouth daily.   metFORMIN 1000 MG tablet Commonly known as:  GLUCOPHAGE Take 500 mg by mouth 2 (two) times daily.   metoprolol 50 MG tablet Commonly known as:  LOPRESSOR Take 50 mg by mouth 2 (two) times daily.   vitamin B-12 1000 MCG tablet Commonly known as:  CYANOCOBALAMIN Take 1,000 mcg by  mouth daily.   Vitamin D3 1000 units Caps Take 1,000 Units by mouth daily.      Follow-up Information    Aviva Signs, MD. Schedule an appointment as soon as possible for a visit on 07/24/2016.   Specialty:  General Surgery Contact information: [id number] [place] [address] [phone]           Signed: Aviva Signs 07/19/2016, 8:26 AM

## 2016-07-19 NOTE — Progress Notes (Signed)
Late entry:  10:30 VSS, discharge medications and instructions reviewed w/pt and his daughter/caregiver.  S/s of infection reviewed and handout given.  Pt discharged home w/daughter and escorted to main entrance via [place] by CNA.

## 2016-07-19 NOTE — Discharge Instructions (Signed)
Open Colectomy, Care After °This sheet gives you information about how to care for yourself after your procedure. Your health care provider may also give you more specific instructions. If you have problems or questions, contact your health care provider. °What can I expect after the procedure? °After the procedure, it is common to have: °· Pain in your abdomen, especially along your incision. °· Tiredness. Your energy level will return to normal over the next several weeks. °· Constipation. °· Nausea. °· Difficulty urinating. °Follow these instructions at home: °Activity  °· You may be able to return to most of your normal activities within 1-2 weeks, such as working, walking up stairs, and sexual activity. °· Avoid activities that require a lot of energy for 4-6 weeks after surgery, such as running, climbing, and lifting heavy objects. Ask your health care provider what activities are safe for you. °· Take rest breaks during the day as needed. °· Do not drive for 1-2 weeks or until your health care provider says that it is safe. °· Do not drive or use heavy machinery while taking prescription pain medicines. °· Do not lift anything that is heavier than 10 lb (4.3 kg) until your health care provider says that it is safe. °Incision care  °· Follow instructions from your health care provider about how to take care of your incision. Make sure you: °¨ Wash your hands with soap and water before you change your bandage (dressing). If soap and water are not available, use hand sanitizer. °¨ Change your dressing as told by your health care provider. °¨ Leave stitches (sutures) or staples in place. These skin closures may need to stay in place for 2 weeks or longer. °· Avoid wearing tight clothing around your incision. °· Protect your incision area from the sun. °· Check your incision area every day for signs of infection. Check for: °¨ More redness, swelling, or pain. °¨ More fluid or blood. °¨ Warmth. °¨ Pus or a bad  smell. °General instructions  °· Do not take baths, swim, or use a hot tub until your health care provider approves. Ask your health care provider when you may shower. °· Take over-the-counter and prescription medicines, including stool softeners, only as told by your health care provider. °· Eat a low-fat and low-fiber diet for the first 4 weeks after surgery. °· Keep all follow-up visits as told by your health care provider. This is important. °Contact a health care provider if: °· You have more redness, swelling, or pain around your incision. °· You have more fluid or blood coming from your incision. °· Your incision feels warm to the touch. °· You have pus or a bad smell coming from your incision. °· You have a fever or chills. °· You do not have a bowel movement 2-3 days after surgery. °· You cannot eat or drink for 24 hours or more. °· You have persistent nausea and vomiting. °· You have abdominal pain that gets worse and does not get better with medicine. °Get help right away if: °· You have chest pain. °· You have shortness of breath. °· You have pain or swelling in your legs. °· Your incision breaks open after your sutures or staples have been removed. °· You have bleeding from the rectum. °This information is not intended to replace advice given to you by your health care provider. Make sure you discuss any questions you have with your health care provider. °Document Released: 10/01/2010 Document Revised: 12/10/2015 Document Reviewed: 12/10/2015 °Elsevier   Interactive Patient Education  2017 [employer].

## 2016-07-24 ENCOUNTER — Ambulatory Visit (INDEPENDENT_AMBULATORY_CARE_PROVIDER_SITE_OTHER): Payer: Self-pay | Admitting: General Surgery

## 2016-07-24 ENCOUNTER — Encounter: Payer: Self-pay | Admitting: General Surgery

## 2016-07-24 VITALS — BP 122/62 | HR 75 | Temp 97.1°F | Resp 18 | Ht 73.0 in | Wt 210.0 lb

## 2016-07-24 DIAGNOSIS — Z09 Encounter for follow-up examination after completed treatment for conditions other than malignant neoplasm: Secondary | ICD-10-CM

## 2016-07-24 NOTE — Progress Notes (Signed)
Subjective:     Kevin Kirk status post right hemicolectomy. Patient has done well since discharge. He has no complaints.  Objective:    BP 122/62   Pulse 75   Temp 97.1 F (36.2 C)   Resp 18   Ht 6\' 1"  (1.854 m)   Wt 210 lb (95.3 kg)   BMI 27.71 kg/m   General:  alert, cooperative and no distress  Abdomen is soft, incision healing well. Staples removed, Steri-Strips applied.     Final pathology revealed a T3, N1, M0 carcinoma of the colon  Assessment:    Doing well postoperatively.    Plan:Patient has been referred to oncology for further management and treatment. He is to follow up here as needed.

## 2016-07-29 ENCOUNTER — Encounter: Payer: Self-pay | Admitting: General Surgery

## 2016-07-29 ENCOUNTER — Ambulatory Visit (INDEPENDENT_AMBULATORY_CARE_PROVIDER_SITE_OTHER): Payer: Self-pay | Admitting: General Surgery

## 2016-07-29 VITALS — BP 126/64 | HR 89 | Temp 97.5°F | Resp 18 | Ht 73.0 in | Wt 209.0 lb

## 2016-07-29 DIAGNOSIS — Z09 Encounter for follow-up examination after completed treatment for conditions other than malignant neoplasm: Secondary | ICD-10-CM

## 2016-07-29 NOTE — Progress Notes (Signed)
Subjective:     Kevin Kirk  Patient woke up yesterday morning feeling bloated and had upper mid abdominal pain. He states the pain did resolve after he passed gas. His bowel movements have been normal. No blood in his stools are noted. He denies any fever or chills. He just presented today for me to examine him to make sure everything was okay. His pain is significantly decreased since yesterday. Objective:    BP 126/64   Pulse 89   Temp 97.5 F (36.4 C)   Resp 18   Ht 6\' 1"  (1.854 m)   Wt 209 lb (94.8 kg)   BMI 27.57 kg/m   General:  alert, cooperative and no distress  Abdomen is soft, nontender, nondistended. Incision healing well. No hernias appreciated. Bowel sounds are active.     Assessment:    Doing well postoperatively. Episode of gas pain which has since resolved.    Plan:   Reassured patient that he was doing well. Follow-up as needed.

## 2016-08-01 ENCOUNTER — Other Ambulatory Visit (HOSPITAL_COMMUNITY): Payer: Self-pay | Admitting: Internal Medicine

## 2016-08-01 DIAGNOSIS — R7989 Other specified abnormal findings of blood chemistry: Secondary | ICD-10-CM

## 2016-08-01 DIAGNOSIS — Z952 Presence of prosthetic heart valve: Secondary | ICD-10-CM

## 2016-08-04 ENCOUNTER — Ambulatory Visit (HOSPITAL_COMMUNITY)
Admission: RE | Admit: 2016-08-04 | Discharge: 2016-08-04 | Disposition: A | Discharge status: Home / Self Care | Payer: Medicare Other | Source: Ambulatory Visit | Attending: Internal Medicine | Admitting: Internal Medicine

## 2016-08-04 DIAGNOSIS — R7989 Other specified abnormal findings of blood chemistry: Secondary | ICD-10-CM | POA: Diagnosis not present

## 2016-08-04 DIAGNOSIS — Z952 Presence of prosthetic heart valve: Secondary | ICD-10-CM | POA: Insufficient documentation

## 2016-08-04 DIAGNOSIS — I2510 Atherosclerotic heart disease of native coronary artery without angina pectoris: Secondary | ICD-10-CM | POA: Insufficient documentation

## 2016-08-04 DIAGNOSIS — I712 Thoracic aortic aneurysm, without rupture: Secondary | ICD-10-CM | POA: Diagnosis not present

## 2016-08-04 MED ORDER — IOPAMIDOL (ISOVUE-370) INJECTION 76%
100.0000 mL | Freq: Once | INTRAVENOUS | Status: AC | PRN
  Administered 2016-08-04: 100 mL via INTRAVENOUS

## 2016-08-05 ENCOUNTER — Encounter (HOSPITAL_COMMUNITY): Payer: Medicare Other | Attending: Oncology | Admitting: Oncology

## 2016-08-05 ENCOUNTER — Encounter (HOSPITAL_COMMUNITY): Payer: Self-pay

## 2016-08-05 VITALS — BP 126/58 | HR 72 | Temp 98.4°F | Resp 18 | Wt 210.1 lb

## 2016-08-05 DIAGNOSIS — C184 Malignant neoplasm of transverse colon: Secondary | ICD-10-CM | POA: Diagnosis present

## 2016-08-05 DIAGNOSIS — D649 Anemia, unspecified: Secondary | ICD-10-CM

## 2016-08-05 MED ORDER — PROCHLORPERAZINE MALEATE 10 MG PO TABS: 10.0000 mg | ORAL_TABLET | Freq: Four times a day (QID) | ORAL | 1 refills | Status: DC | PRN

## 2016-08-05 MED ORDER — LIDOCAINE-PRILOCAINE 2.5-2.5 % EX CREA: TOPICAL_CREAM | CUTANEOUS | 3 refills | Status: DC

## 2016-08-05 MED ORDER — ONDANSETRON HCL 8 MG PO TABS: 8.0000 mg | ORAL_TABLET | Freq: Two times a day (BID) | ORAL | 1 refills | Status: DC | PRN

## 2016-08-05 NOTE — Patient Instructions (Signed)
[place] at [facility] Discharge Instructions  RECOMMENDATIONS MADE BY THE CONSULTANT AND ANY TEST RESULTS WILL BE SENT TO YOUR REFERRING PHYSICIAN.  You were seen today by Dr. Twana First We will refer you to Dr. Arnoldo Morale to have your port a cath placed We will schedule to begin chemotherapy around the 1st Monday in June We have included some information on the chemotherapy drugs you will be receiving A colon cancer book with useful information has been given today as well.   Thank you for choosing [place] at [facility] to provide your oncology and hematology care.  To afford each patient quality time with our provider, please arrive at least 15 minutes before your scheduled appointment time.    If you have a lab appointment with the [facility] please come in thru the  Main Entrance and check in at the main information desk  You need to re-schedule your appointment should you arrive 10 or more minutes late.  We strive to give you quality time with our providers, and arriving late affects you and other patients whose appointments are after yours.  Also, if you no show three or more times for appointments you may be dismissed from the clinic at the providers discretion.     Again, thank you for choosing [facility].  Our hope is that these requests will decrease the amount of time that you wait before being seen by our physicians.       _____________________________________________________________  Should you have questions after your visit to [facility], please contact our office at [phone] between the hours of 8:30 a.m. and 4:30 p.m.  Voicemails left after 4:30 p.m. will not be returned until the following business day.  For prescription refill requests, have your pharmacy contact our office.       Resources For Cancer Patients and their Caregivers ? American Cancer Society: Can assist with  transportation, wigs, general needs, runs Look Good Feel Better.        6184095750 ? Cancer Care: Provides financial assistance, online support groups, medication/co-pay assistance.  1-800-813-HOPE 3150244766) ? [place] Assists [place] cancer patients and their families through emotional , educational and financial support.  772-132-3625 ? [place] DSS Where to apply for food stamps, Medicaid and utility assistance. (732)332-4603 ? [facility]: Transportation to medical appointments. 782-171-5525 ? Social Security Administration: May apply for disability if have a Stage IV cancer. ([phone] 085-9677 9285837409 ? [employer], Disability and Transit Services: Assists with nutrition, care and transit needs. [place] Support Programs: @10RELATIVEDAYS @ > Cancer Support Group  2nd Tuesday of the month 1pm-2pm, Journey Room  > Creative Journey  3rd Tuesday of the month 1130am-1pm, Journey Room  > Look Good Feel Better  1st Wednesday of the month 10am-12 noon, Journey Room (Call [place] to register [phone])

## 2016-08-05 NOTE — Progress Notes (Signed)
[name] NOTE  Patient Care Team: Tobe Sos, MD as PCP - General (Internal Medicine)  CHIEF COMPLAINTS/PURPOSE OF CONSULTATION:  Adenocarcinoma of the colon, Stage IIIB   HISTORY OF PRESENTING ILLNESS:  Kevin Kirk 83 y.o. male is here because of referral by Dr. Aviva Signs. The patient initially noticed bright red blood in his stool in December 2017.   The patient underwent colonoscopy on 06/05/16 with Dr. Laural Golden which showed a fungating, polypoid and ulcerated non-obstructing large mass was found in the mid transverse colon. The mass was partially circumferential (involving two-thirds of the lumen circumference). The mass measured three cm in length. No bleeding was present. Subsequent biopsy on 06/05/16 revealed adenocarcinoma.  06/20/16: CT abd/pelvis with contrast: Mild focal wall thickening along the proximal/mid transverse colon in the right mid abdomen, possibly corresponding to the abnormality on colonoscopy, equivocal. No findings suspicious for metastatic disease in the abdomen/pelvis.  07/11/16: Partial colectomy by Dr. Arnoldo Morale. This confirmed adenocarcinoma of the colon, grade 2, spanning 3.8 cm. The tumor invaded through the muscularis propria into the pericolonic soft tissues. Additional lymphovascular invasion was identified. Margins of resection were negative.  Of 16 lymph nodes sampled, one was found positive for metastatic disease. All other lymph nodes negative.  08/04/16: CTA chest: No demonstrable pulmonary embolus. Ascending thoracic aortic diameter is 4.6 x 4.4 cm. No dissection evident. Ascending thoracic aortic aneurysm. Granuloma right base, calcified. Bibasilar atelectasis. Mild calcification along the posterior right apical pleural region without pleural thickening or mass associated. No adenopathy. Scattered foci of aortic and coronary artery calcification. Status post aortic valve replacement.  Baseline CEA: 2.6  The patient  reports to the clinic today to establish care and discuss the role that medical oncology may play in the treatment of his disease. He is accompanied today by a friend.  We discussed the patient's surgical pathology today. The patient reports he wants to do "whatever is necessary" to beat his cancer.  He denies pain since his colectomy, he has not used any pain pills. He denies chest pain or breathing concerns. He reports ongoing diarrhea since his resection. He reports weight loss of 30 lbs since December. He states his appetite is picking up. The patient is taking oral iron daily.    MEDICAL HISTORY:  Past Medical History:  Diagnosis Date  . Chronic pulmonary embolism ([place]) 42/3536   compliction of the valve replacement surgery  . COPD (chronic obstructive pulmonary disease) ([place])   . Diabetes ([place]) 04/22/2016  . H/O aortic valve replacement 10/2008  . Hypercholesteremia   . Hypertension   . Pulmonary emboli ([place]) 10/2008  . Sleep apnea     SURGICAL HISTORY: Past Surgical History:  Procedure Laterality Date  . AORTIC VALVE REPLACEMENT     2010  . CARDIAC CATHETERIZATION  2010  . COLONOSCOPY N/A 06/05/2016   Procedure: COLONOSCOPY;  Surgeon: Rogene Houston, MD;  Location[facility];  Service: Endoscopy;  Laterality: N/A;  . PARTIAL COLECTOMY N/A 07/11/2016   Procedure: PARTIAL COLECTOMY;  Surgeon: Aviva Signs, MD;  Location: [facility] ORS;  Service: General;  Laterality: N/A;  . [place]     from trauma    SOCIAL HISTORY: Social History   Social History  . Marital status: Widowed    Spouse name: N/A  . Number of children: N/A  . Years of education: N/A   Occupational History  . Not on file.  Social History Main Topics  . Smoking status: Former Smoker    Years: 15.00    Types: Cigarettes    Quit date: 1964  . Smokeless tobacco: Never Used  . Alcohol use No  . Drug use: No  . Sexual activity: Yes   Other Topics Concern  . Not  on file   Social History Narrative  . No narrative on file  The patient reports his wife passed in September 2017, and he now lives alone. The patient has a granddaughter who lives near him and is available to assist him as needed. The patient is a former smoker.  FAMILY HISTORY: Family History  Problem Relation Age of Onset  . Colon cancer Neg Hx     ALLERGIES:  is allergic to amoxicillin; prednisone; and tamsulosin hcl.  MEDICATIONS:  Current Outpatient Prescriptions  Medication Sig Dispense Refill  . acetaminophen (TYLENOL) 500 MG tablet Take 500 mg by mouth daily as needed for moderate pain or headache.    Marland Kitchen aspirin EC 81 MG tablet Take 81 mg by mouth daily.    Marland Kitchen atorvastatin (LIPITOR) 10 MG tablet Take 10 mg by mouth at bedtime.     . Cholecalciferol (VITAMIN D3) 1000 units CAPS Take 1,000 Units by mouth daily.     . ferrous sulfate 325 (65 FE) MG tablet Take 325 mg by mouth daily with breakfast.    . finasteride (PROSCAR) 5 MG tablet Take 5 mg by mouth at bedtime.     . Glucosamine HCl 1000 MG TABS Take 1,000 mg by mouth 2 (two) times daily.    Marland Kitchen lisinopril (PRINIVIL,ZESTRIL) 5 MG tablet Take 5 mg by mouth at bedtime.     Marland Kitchen MAGNESIUM PO Take 265 mg by mouth daily.    . metFORMIN (GLUCOPHAGE) 1000 MG tablet Take 500 mg by mouth 2 (two) times daily.    . metoprolol (LOPRESSOR) 50 MG tablet Take 50 mg by mouth 2 (two) times daily.    . vitamin B-12 (CYANOCOBALAMIN) 1000 MCG tablet Take 1,000 mcg by mouth daily.      No current facility-administered medications for this visit.     Review of Systems  Constitutional: Negative.   HENT: Negative.   Eyes: Negative.   Respiratory: Negative.  Negative for shortness of breath and wheezing.   Cardiovascular: Negative.  Negative for chest pain.  Gastrointestinal: Positive for diarrhea (Ongoing since resection 4/20). Negative for abdominal pain and constipation.  Genitourinary: Negative.   Musculoskeletal: Negative.   Skin:  Negative.   Neurological: Negative.   Endo/Heme/Allergies: Negative.   Psychiatric/Behavioral: Negative.   All other systems reviewed and are negative.  14 point ROS was done and is otherwise as detailed above or in HPI   PHYSICAL EXAMINATION: ECOG PERFORMANCE STATUS: 0 - Asymptomatic  Vitals:   08/05/16 1320  BP: (!) 126/58  Pulse: 72  Resp: 18  Temp: 98.4 F (36.9 C)   Filed Weights   08/05/16 1320  Weight: 210 lb 1.6 oz (95.3 kg)     Physical Exam  Constitutional: He is oriented to person, place, and time and well-developed, well-nourished, and in no distress.  HENT:  Head: Normocephalic and atraumatic.  Eyes: Conjunctivae and EOM are normal. Pupils are equal, round, and reactive to light.  Neck: Normal range of motion. Neck supple.  Cardiovascular: Normal rate, regular rhythm and normal heart sounds.  Exam reveals no gallop and no friction rub.   No murmur heard. Pulmonary/Chest: Effort normal and breath sounds normal. No  respiratory distress. He has no wheezes. He has no rales. He exhibits no tenderness.  Abdominal: Soft. Bowel sounds are normal. He exhibits no distension and no mass. There is tenderness (mild). There is no rebound and no guarding.    Musculoskeletal: Normal range of motion.  Neurological: He is alert and oriented to person, place, and time. Gait normal.  Skin: Skin is warm and dry.  Nursing note and vitals reviewed.     LABORATORY DATA:  I have reviewed the data as listed Lab Results  Component Value Date   WBC 13.6 (H) 07/18/2016   HGB 11.7 (L) 07/18/2016   HCT 35.8 (L) 07/18/2016   MCV 94.7 07/18/2016   PLT 291 07/18/2016   CMP     Component Value Date/Time   NA 134 (L) 07/18/2016 0630   K 3.6 07/18/2016 0630   CL 94 (L) 07/18/2016 0630   CO2 32 07/18/2016 0630   GLUCOSE 151 (H) 07/18/2016 0630   BUN 14 07/18/2016 0630   CREATININE 0.79 07/18/2016 0630   CALCIUM 8.1 (L) 07/18/2016 0630   PROT 7.3 07/07/2016 1325   ALBUMIN  4.0 07/07/2016 1325   AST 23 07/07/2016 1325   ALT 20 07/07/2016 1325   ALKPHOS 39 07/07/2016 1325   BILITOT 0.6 07/07/2016 1325   GFRNONAA >60 07/18/2016 0630   GFRAA >60 07/18/2016 0630   PATHOLOGY  Diagnosis 07/11/16 Colon, segmental resection for tumor, right ADENOCARCINOMA OF THE COLON, GRADE 2 (3.8 CM) THE TUMOR INVADES THROUGH THE MUSCULARIS PROPRIA INTO PERICOLONIC SOFT TISSUE (PT3) LYMPHOVASCULAR INVASION IS IDENTIFIED MARGINS OF RESECTION ARE NEGATIVE FOR CARCINOMA METASTATIC ADENOCARCINOMA IN ONE OF SIXTEEN LYMPH NODE (1/16) ONE EXTRAMURAL SATELLITE TUMOR NODULE UNREMARKABLE APPENDIX BENIGN ECTOPIC SPLEEN TISSUE Microscopic Comment COLON AND RECTUM (INCLUDING TRANS-ANAL RESECTION): Specimen: Right colon Procedure: Segmental resection Tumor site: distal right colon Specimen integrity: Intact Macroscopic intactness of mesorectum: Not applicable: x Complete: NA Near complete: NA Incomplete: NA Cannot be determined (specify): NA Macroscopic tumor perforation: Into subserosal adipose tissue Invasive tumor: Maximum size: 3.8 cm Histologic type(s): Adenocarcinoma Histologic grade and differentiation: G1: well differentiated/low grade G2: moderately differentiated/low grade 1 of 5 Supplemental copy SUPPLEMENTAL for OLSEN, MCCUTCHAN ([id number]) Microscopic Comment(continued) G3: poorly differentiated/high grade G4: undifferentiated/high grade Type of polyp in which invasive carcinoma arose: Tubular adenoma Microscopic extension of invasive tumor: Into peri colonic soft tissue Lymph-Vascular invasion: Present Peri-neural invasion: Negative Tumor deposit(s) (discontinuous extramural extension): present Resection margins: Proximal margin: Negative Distal margin: Negative Circumferential (radial) (posterior ascending, posterior descending; lateral and posterior mid-rectum; and entire lower 1/3 rectum): 2.1 cm Mesenteric margin (sigmoid and transverse):  negative Distance closest margin (if all above margins negative): 1.5 cm Trans-anal resection margins only: Deep margin: NA Mucosal Margin: NA Distance closest mucosal margin (if negative): NA Treatment effect (neo-adjuvant therapy): Negative Additional polyp(s): Negative Non-neoplastic findings: Ectopic spleen tissue Lymph nodes: number examined 16; number positive: 1 Pathologic Staging: pT3, pN1, pMx Ancillary studies: ordered  Diagnosis 06/05/16 Colon, polyp(s), ulcerative mass - ADENOCARCINOMA.   RADIOGRAPHIC STUDIES: I have personally reviewed the radiological images as listed and agreed with the findings in the report. Ct Angio Chest Pe W Or Wo Contrast  Result Date: 08/04/2016 CLINICAL DATA:  Shortness of breath and elevated D-dimer. EXAM: CT ANGIOGRAPHY CHEST WITH CONTRAST TECHNIQUE: Multidetector CT imaging of the chest was performed using the standard protocol during bolus administration of intravenous contrast. Multiplanar CT image reconstructions and MIPs were obtained to evaluate the vascular anatomy. CONTRAST:  100 mL Isovue 370  nonionic COMPARISON:  Chest radiograph July 15, 2016 FINDINGS: Cardiovascular: There is no evident pulmonary embolus. The ascending thoracic aorta has a maximum transverse diameter 4.6 x 4.4 cm. There is no thoracic aortic dissection. The visualized great vessels appear unremarkable except for minimal foci of calcification. There are scattered foci of calcification in the aorta. There are scattered foci of coronary artery calcification. Pericardium is not appreciably thickened. Patient has had previous aortic valve replacement. Mediastinum/Nodes: Thyroid appears unremarkable. There are scattered subcentimeter mediastinal lymph nodes. There is no adenopathy by size criteria. Lungs/Pleura: There is mild scarring in the right apex. There is patchy atelectasis in both lower lung zones. There is no frank edema or consolidation. There is an apparent granuloma in  the posterior segment right lower lobe inferiorly measuring 9 x 9 mm. There are scattered foci of pleural calcification in the right apex posteriorly. There is no pleural effusion. Upper Abdomen: There is atherosclerotic calcification in the abdominal aorta. Visualized upper abdominal structures otherwise appear unremarkable. Musculoskeletal: There is degenerative change in the thoracic spine. The patient is status post median sternotomy. No blastic or lytic bone lesions. Review of the MIP images confirms the above findings. IMPRESSION: No demonstrable pulmonary embolus. Ascending thoracic aortic diameter is 4.6 x 4.4 cm. No dissection evident. Ascending thoracic aortic aneurysm. Recommend semi-annual imaging followup by CTA or MRA and referral to cardiothoracic surgery if not already obtained. This recommendation follows 2010 ACCF/AHA/AATS/ACR/ASA/SCA/SCAI/SIR/STS/SVM Guidelines for the Diagnosis and Management of Patients With Thoracic Aortic Disease. Circulation. 2010; 121: Z791-T056. Granuloma right base, calcified. Bibasilar atelectasis. Mild calcification along the posterior right apical pleural region without pleural thickening or mass associated. No adenopathy. Scattered foci of aortic and coronary artery calcification. Status post aortic valve replacement. Electronically Signed   By: Lowella Grip III M.D.   On: 08/04/2016 09:53    ASSESSMENT & PLAN:  Cancer Staging Malignant neoplasm of transverse colon [facility]) Staging form: Colon and Rectum, AJCC 8th Edition - Clinical: Stage IIIB (cT3, cN1a, cM0) - Signed by Twana First, MD on 08/05/2016  No problem-specific Assessment & Plan notes found for this encounter. Adenocarcinoma of the colon, Stage IIIB Mild normocytic anemia likely due to blood loss from colon cancer  PLAN: We discussed the patient's surgical pathology, showing adenocarcinoma of the colon, Stage IIIB. I have discussed adjuvant chemotherapy with FOLFOX q2 weeks for 12 cycles and  the patient is willing to proceed. I have discussed the side effects in detail with the patient as well as provided him reading material to take home. He will get chemo teaching again by our nurse navigator prior to starting cycle 1 of chemo. Tentative start date is 08/25/16 which will be 6 weeks from his surgery.  We discussed the patient's recent CT Angiogram from 08/04/16, which showed thoracic aortic aneurism. The patient will discuss this with his PCP and follow with close monitoring with CT scans every 6 months as indicated.  Referral back to Dr. Arnoldo Morale for stat chemoport insertion. The patient is in agreement.  The patient understands the importance of maintaining good nutrition and hydration before and throughout treatment. The patient may drink nutritional shakes such as Ensure as desired.  Patient is currently on a daily iron pill. Will check his iron studies on the next visit.   RTC in 08/25/16.  ORDERS PLACED FOR THIS ENCOUNTER: Orders Placed This Encounter  Procedures  . CBC with Differential  . Comprehensive metabolic panel  . CBC with Differential [facility])  . Comprehensive metabolic panel  .  CBC with Differential  . Comprehensive metabolic panel    MEDICATIONS PRESCRIBED THIS ENCOUNTER: Meds ordered this encounter  Medications  . lidocaine-prilocaine (EMLA) cream    Sig: Apply to affected area once    Dispense:  30 g    Refill:  3  . ondansetron (ZOFRAN) 8 MG tablet    Sig: Take 1 tablet (8 mg total) by mouth 2 (two) times daily as needed for refractory nausea / vomiting. Start on day 3 after chemotherapy.    Dispense:  30 tablet    Refill:  1  . prochlorperazine (COMPAZINE) 10 MG tablet    Sig: Take 1 tablet (10 mg total) by mouth every 6 (six) hours as needed (Nausea or vomiting).    Dispense:  30 tablet    Refill:  1    All questions were answered. The patient knows to call the clinic with any problems, questions or concerns.  I spent 40 minutes  counseling the patient face to face. The total time spent in the appointment was 60 minutes and more than 50% was on counseling and review of test results  This document serves as a record of services personally performed by Twana First, MD. It was created on her behalf by Maryla Morrow, a trained medical scribe. The creation of this record is based on the scribe's personal observations and the provider's statements to them. This document has been checked and approved by the attending provider.  This note was electronically signed by:  Twana First, MD  08/05/2016 1:25 PM

## 2016-08-05 NOTE — H&P (Signed)
Kevin Kirk is an 83 y.o. male.   Chief Complaint: Colon carcinoma, need for central venous access HPI: Patient is an 83 year old white male who recently underwent right hemicolectomy for colon carcinoma who now presents for Port-A-Cath insertion. He is about to undergo chemotherapy for stage III adenocarcinoma of the colon.  Past Medical History:  Diagnosis Date  . Chronic pulmonary embolism ([place]) 11/3816   compliction of the valve replacement surgery  . COPD (chronic obstructive pulmonary disease) ([place])   . Diabetes ([place]) 04/22/2016  . H/O aortic valve replacement 10/2008  . Hypercholesteremia   . Hypertension   . Pulmonary emboli ([place]) 10/2008  . Sleep apnea     Past Surgical History:  Procedure Laterality Date  . AORTIC VALVE REPLACEMENT     2010  . CARDIAC CATHETERIZATION  2010  . COLONOSCOPY N/A 06/05/2016   Procedure: COLONOSCOPY;  Surgeon: Kevin Houston, MD;  [facility];  Service: Endoscopy;  Laterality: N/A;  . PARTIAL COLECTOMY N/A 07/11/2016   Procedure: PARTIAL COLECTOMY;  Surgeon: Kevin Signs, MD;  Location: [facility] ORS;  Service: General;  Laterality: N/A;  . [place]     from trauma    Family History  Problem Relation Age of Onset  . Colon cancer Neg Hx    Social History:  reports that he quit smoking about 54 years ago. His smoking use included Cigarettes. He quit after 15.00 years of use. He has never used smokeless tobacco. He reports that he does not drink alcohol or use drugs.  Allergies:  Allergies  Allergen Reactions  . Amoxicillin Hives    Has patient had a PCN reaction causing immediate rash, facial/tongue/throat swelling, SOB or lightheadedness with hypotension: No Has patient had a PCN reaction causing severe rash involving mucus membranes or skin necrosis: Yes Has patient had a PCN reaction that required hospitalization No Has patient had a PCN reaction occurring within the last 10 years: No If  all of the above answers are "NO", then may proceed with Cephalosporin use.   . Prednisone     Pt does not want to take this medication   . Tamsulosin Hcl     Cant sleep, confusion     No prescriptions prior to admission.    No results found for this or any previous visit (from the past 48 hour(s)). Ct Angio Chest Pe W Or Wo Contrast  Result Date: 08/04/2016 CLINICAL DATA:  Shortness of breath and elevated D-dimer. EXAM: CT ANGIOGRAPHY CHEST WITH CONTRAST TECHNIQUE: Multidetector CT imaging of the chest was performed using the standard protocol during bolus administration of intravenous contrast. Multiplanar CT image reconstructions and MIPs were obtained to evaluate the vascular anatomy. CONTRAST:  100 mL Isovue 370 nonionic COMPARISON:  Chest radiograph July 15, 2016 FINDINGS: Cardiovascular: There is no evident pulmonary embolus. The ascending thoracic aorta has a maximum transverse diameter 4.6 x 4.4 cm. There is no thoracic aortic dissection. The visualized great vessels appear unremarkable except for minimal foci of calcification. There are scattered foci of calcification in the aorta. There are scattered foci of coronary artery calcification. Pericardium is not appreciably thickened. Patient has had previous aortic valve replacement. Mediastinum/Nodes: Thyroid appears unremarkable. There are scattered subcentimeter mediastinal lymph nodes. There is no adenopathy by size criteria. Lungs/Pleura: There is mild scarring in the right apex. There is patchy atelectasis in both lower lung zones. There is no frank edema or consolidation. There is an apparent  granuloma in the posterior segment right lower lobe inferiorly measuring 9 x 9 mm. There are scattered foci of pleural calcification in the right apex posteriorly. There is no pleural effusion. Upper Abdomen: There is atherosclerotic calcification in the abdominal aorta. Visualized upper abdominal structures otherwise appear unremarkable.  Musculoskeletal: There is degenerative change in the thoracic spine. The patient is status post median sternotomy. No blastic or lytic bone lesions. Review of the MIP images confirms the above findings. IMPRESSION: No demonstrable pulmonary embolus. Ascending thoracic aortic diameter is 4.6 x 4.4 cm. No dissection evident. Ascending thoracic aortic aneurysm. Recommend semi-annual imaging followup by CTA or MRA and referral to cardiothoracic surgery if not already obtained. This recommendation follows 2010 ACCF/AHA/AATS/ACR/ASA/SCA/SCAI/SIR/STS/SVM Guidelines for the Diagnosis and Management of Patients With Thoracic Aortic Disease. Circulation. 2010; 121: [id number]. Granuloma right base, calcified. Bibasilar atelectasis. Mild calcification along the posterior right apical pleural region without pleural thickening or mass associated. No adenopathy. Scattered foci of aortic and coronary artery calcification. Status post aortic valve replacement. Electronically Signed   By: Lowella Grip III M.D.   On: 08/04/2016 09:53    Review of Systems  All other systems reviewed and are negative.   There were no vitals taken for this visit. Physical Exam  Vitals reviewed. Constitutional: He is oriented to person, place, and time. He appears well-developed and well-nourished.  HENT:  Head: Normocephalic and atraumatic.  Neck: Normal range of motion. Neck supple.  Cardiovascular: Normal rate and normal heart sounds.   No murmur heard. Respiratory: Effort normal and breath sounds normal. He has no wheezes. He has no rales.  GI: Soft. Bowel sounds are normal. He exhibits no distension. There is no tenderness. There is no rebound.  Incision well-healed.  Neurological: He is alert and oriented to person, place, and time.  Skin: Skin is warm and dry.     Assessment/Plan Stage III, colon carcinoma. Need for central venous access Plan: Patient is scheduled for Port-A-Cath insertion on 08/13/2016. The risks and  benefits of the procedure including bleeding, infection, and pneumothorax were explained to the patient, who gave informed consent.  Kevin Signs, MD 08/05/2016, 3:22 PM

## 2016-08-11 ENCOUNTER — Encounter (HOSPITAL_COMMUNITY)
Admission: RE | Admit: 2016-08-11 | Discharge: 2016-08-11 | Disposition: A | Discharge status: Home / Self Care | Payer: Medicare Other | Source: Ambulatory Visit | Attending: General Surgery | Admitting: General Surgery

## 2016-08-12 NOTE — H&P (Signed)
Kevin Kirk is an 83 y.o. male.   Chief Complaint: Colon carcinoma, need for central venous access HPI: Patient is an 83 year old white male status post partial colectomy for colon cancer who is about to undergo chemotherapy and needs a Port-A-Cath inserted for central venous access.  Past Medical History:  Diagnosis Date  . Chronic pulmonary embolism ([place]) [id number]/8366   compliction of the valve replacement surgery  . COPD (chronic obstructive pulmonary disease) ([place])   . Diabetes ([place]) 04/22/2016  . H/O aortic valve replacement 10/2008  . Hypercholesteremia   . Hypertension   . Pulmonary emboli ([place]) 10/2008  . Sleep apnea     Past Surgical History:  Procedure Laterality Date  . AORTIC VALVE REPLACEMENT     2010  . CARDIAC CATHETERIZATION  2010  . COLONOSCOPY N/A 06/05/2016   Procedure: COLONOSCOPY;  Surgeon: Rogene Houston, MD;  Location: [facility];  Service: Endoscopy;  Laterality: N/A;  . PARTIAL COLECTOMY N/A 07/11/2016   Procedure: PARTIAL COLECTOMY;  Surgeon: Aviva Signs, MD;  Location: [facility] ORS;  Service: General;  Laterality: N/A;  . [place]     from trauma    Family History  Problem Relation Age of Onset  . Colon cancer Neg Hx    Social History:  reports that he quit smoking about 54 years ago. His smoking use included Cigarettes. He quit after 15.00 years of use. He has never used smokeless tobacco. He reports that he does not drink alcohol or use drugs.  Allergies:  Allergies  Allergen Reactions  . Amoxicillin Hives    Has patient had a PCN reaction causing immediate rash, facial/tongue/throat swelling, SOB or lightheadedness with hypotension: No Has patient had a PCN reaction causing severe rash involving mucus membranes or skin necrosis: Yes Has patient had a PCN reaction that required hospitalization No Has patient had a PCN reaction occurring within the last 10 years: No If all of the above answers are "NO", then may  proceed with Cephalosporin use.   . Prednisone     Pt does not want to take this medication   . Tamsulosin Hcl     Cant sleep, confusion     No prescriptions prior to admission.    No results found for this or any previous visit (from the past 48 hour(s)). No results found.  Review of Systems  Constitutional: Negative.   HENT: Negative.   Eyes: Negative.   Respiratory: Negative.   Cardiovascular: Negative.   Gastrointestinal: Negative.   Genitourinary: Negative.   Musculoskeletal: Negative.   Skin: Negative.   Neurological: Negative.   Endo/Heme/Allergies: Negative.   Psychiatric/Behavioral: Negative.     There were no vitals taken for this visit. Physical Exam  Vitals reviewed. Constitutional: He is oriented to person, place, and time. He appears well-developed and well-nourished.  HENT:  Head: Normocephalic and atraumatic.  Neck: Normal range of motion. Neck supple.  Cardiovascular: Normal rate and regular rhythm.   No murmur heard. Respiratory: Effort normal. He has no wheezes. He has rales.  GI: Soft. Bowel sounds are normal.  Well-healed surgical scar noted.  Neurological: He is alert and oriented to person, place, and time.  Skin: Skin is warm and dry.     Assessment/Plan Impression: Colon carcinoma, need for central venous access Plan: Patient scheduled for Port-A-Cath insertion on 08/13/2016. The risks and benefits of the procedure were fully explained to the patient, who gave informed consent.  Elta Guadeloupe  Arnoldo Morale, MD 08/12/2016, 3:06 PM

## 2016-08-13 ENCOUNTER — Encounter (HOSPITAL_COMMUNITY)
Admission: RE | Disposition: A | Discharge status: Home / Self Care | Payer: Self-pay | Source: Ambulatory Visit | Attending: General Surgery

## 2016-08-13 ENCOUNTER — Ambulatory Visit (HOSPITAL_COMMUNITY): Payer: Medicare Other | Admitting: Anesthesiology

## 2016-08-13 ENCOUNTER — Ambulatory Visit (HOSPITAL_COMMUNITY): Payer: Medicare Other

## 2016-08-13 ENCOUNTER — Encounter (HOSPITAL_COMMUNITY): Payer: Self-pay | Admitting: *Deleted

## 2016-08-13 ENCOUNTER — Ambulatory Visit (HOSPITAL_COMMUNITY)
Admission: RE | Admit: 2016-08-13 | Discharge: 2016-08-13 | Disposition: A | Discharge status: Home / Self Care | Payer: Medicare Other | Source: Ambulatory Visit | Attending: General Surgery | Admitting: General Surgery

## 2016-08-13 DIAGNOSIS — E7800 Pure hypercholesterolemia, unspecified: Secondary | ICD-10-CM | POA: Insufficient documentation

## 2016-08-13 DIAGNOSIS — I10 Essential (primary) hypertension: Secondary | ICD-10-CM | POA: Insufficient documentation

## 2016-08-13 DIAGNOSIS — J449 Chronic obstructive pulmonary disease, unspecified: Secondary | ICD-10-CM | POA: Insufficient documentation

## 2016-08-13 DIAGNOSIS — Z952 Presence of prosthetic heart valve: Secondary | ICD-10-CM | POA: Diagnosis not present

## 2016-08-13 DIAGNOSIS — C189 Malignant neoplasm of colon, unspecified: Secondary | ICD-10-CM | POA: Insufficient documentation

## 2016-08-13 DIAGNOSIS — E119 Type 2 diabetes mellitus without complications: Secondary | ICD-10-CM | POA: Diagnosis not present

## 2016-08-13 DIAGNOSIS — Z96659 Presence of unspecified artificial knee joint: Secondary | ICD-10-CM | POA: Diagnosis not present

## 2016-08-13 DIAGNOSIS — Z880 Allergy status to penicillin: Secondary | ICD-10-CM | POA: Diagnosis not present

## 2016-08-13 DIAGNOSIS — Z9049 Acquired absence of other specified parts of digestive tract: Secondary | ICD-10-CM | POA: Insufficient documentation

## 2016-08-13 DIAGNOSIS — G4730 Sleep apnea, unspecified: Secondary | ICD-10-CM | POA: Insufficient documentation

## 2016-08-13 DIAGNOSIS — Z888 Allergy status to other drugs, medicaments and biological substances status: Secondary | ICD-10-CM | POA: Insufficient documentation

## 2016-08-13 DIAGNOSIS — Z87891 Personal history of nicotine dependence: Secondary | ICD-10-CM | POA: Insufficient documentation

## 2016-08-13 DIAGNOSIS — E118 Type 2 diabetes mellitus with unspecified complications: Secondary | ICD-10-CM | POA: Diagnosis not present

## 2016-08-13 DIAGNOSIS — Z86711 Personal history of pulmonary embolism: Secondary | ICD-10-CM | POA: Insufficient documentation

## 2016-08-13 DIAGNOSIS — Z95828 Presence of other vascular implants and grafts: Secondary | ICD-10-CM

## 2016-08-13 HISTORY — PX: PORTACATH PLACEMENT: SHX2246

## 2016-08-13 LAB — GLUCOSE, CAPILLARY
Glucose-Capillary: 119 mg/dL — ABNORMAL HIGH (ref 65–99)
Glucose-Capillary: 139 mg/dL — ABNORMAL HIGH (ref 65–99)

## 2016-08-13 SURGERY — INSERTION, TUNNELED CENTRAL VENOUS DEVICE, WITH PORT
Anesthesia: Monitor Anesthesia Care

## 2016-08-13 MED ORDER — FENTANYL CITRATE (PF) 100 MCG/2ML IJ SOLN
INTRAMUSCULAR | Status: AC
  Filled 2016-08-13: qty 2

## 2016-08-13 MED ORDER — CHLORHEXIDINE GLUCONATE CLOTH 2 % EX PADS: 6.0000 | MEDICATED_PAD | Freq: Once | CUTANEOUS | Status: DC

## 2016-08-13 MED ORDER — MIDAZOLAM HCL 5 MG/5ML IJ SOLN
INTRAMUSCULAR | Status: DC | PRN
  Administered 2016-08-13 (×2): 1 mg via INTRAVENOUS

## 2016-08-13 MED ORDER — LIDOCAINE HCL (PF) 1 % IJ SOLN
INTRAMUSCULAR | Status: DC | PRN
Start: 2016-08-13 — End: 2016-08-13
  Administered 2016-08-13: 10 mL

## 2016-08-13 MED ORDER — SODIUM CHLORIDE 0.9 % IV SOLN
INTRAVENOUS | Status: AC | PRN
  Administered 2016-08-13: 500 mL

## 2016-08-13 MED ORDER — PROPOFOL 500 MG/50ML IV EMUL
INTRAVENOUS | Status: DC | PRN
  Administered 2016-08-13: 75 ug/kg/min via INTRAVENOUS

## 2016-08-13 MED ORDER — HEPARIN SOD (PORK) LOCK FLUSH 100 UNIT/ML IV SOLN
INTRAVENOUS | Status: DC | PRN
  Administered 2016-08-13: 500 [IU] via INTRAVENOUS

## 2016-08-13 MED ORDER — MIDAZOLAM HCL 2 MG/2ML IJ SOLN
INTRAMUSCULAR | Status: AC
  Filled 2016-08-13: qty 2

## 2016-08-13 MED ORDER — KETOROLAC TROMETHAMINE 30 MG/ML IJ SOLN
INTRAMUSCULAR | Status: AC
  Filled 2016-08-13: qty 1

## 2016-08-13 MED ORDER — LIDOCAINE HCL (PF) 1 % IJ SOLN
INTRAMUSCULAR | Status: AC
  Filled 2016-08-13: qty 30

## 2016-08-13 MED ORDER — LACTATED RINGERS IV SOLN
INTRAVENOUS | Status: DC
  Administered 2016-08-13: 11:00:00 via INTRAVENOUS

## 2016-08-13 MED ORDER — KETOROLAC TROMETHAMINE 30 MG/ML IJ SOLN
30.0000 mg | Freq: Once | INTRAMUSCULAR | Status: AC
  Administered 2016-08-13: 30 mg via INTRAVENOUS

## 2016-08-13 MED ORDER — FENTANYL CITRATE (PF) 100 MCG/2ML IJ SOLN
25.0000 ug | INTRAMUSCULAR | Status: DC | PRN
Start: 2016-08-13 — End: 2016-08-13
  Administered 2016-08-13: 25 ug via INTRAVENOUS

## 2016-08-13 MED ORDER — MIDAZOLAM HCL 2 MG/2ML IJ SOLN
1.0000 mg | INTRAMUSCULAR | Status: DC | PRN
  Administered 2016-08-13: 2 mg via INTRAVENOUS

## 2016-08-13 MED ORDER — VANCOMYCIN HCL IN DEXTROSE 1-5 GM/200ML-% IV SOLN
1000.0000 mg | INTRAVENOUS | Status: DC
  Filled 2016-08-13: qty 200

## 2016-08-13 MED ORDER — HEPARIN SOD (PORK) LOCK FLUSH 100 UNIT/ML IV SOLN
INTRAVENOUS | Status: AC
  Filled 2016-08-13: qty 5

## 2016-08-13 SURGICAL SUPPLY — 37 items
APPLIER CLIP 9.375 SM OPEN (CLIP)
BAG DECANTER FOR FLEXI CONT (MISCELLANEOUS) ×3 IMPLANT
BAG HAMPER (MISCELLANEOUS) ×3 IMPLANT
CATH HICKMAN DUAL 12.0 (CATHETERS) IMPLANT
CHLORAPREP W/TINT 10.5 ML (MISCELLANEOUS) ×3 IMPLANT
CLIP APPLIE 9.375 SM OPEN (CLIP) IMPLANT
CLOTH BEACON ORANGE TIMEOUT ST (SAFETY) ×3 IMPLANT
COVER LIGHT HANDLE STERIS (MISCELLANEOUS) ×6 IMPLANT
DECANTER SPIKE VIAL GLASS SM (MISCELLANEOUS) ×3 IMPLANT
DERMABOND ADHESIVE PROPEN (GAUZE/BANDAGES/DRESSINGS) ×2
DERMABOND ADVANCED (GAUZE/BANDAGES/DRESSINGS) ×2
DERMABOND ADVANCED .7 DNX12 (GAUZE/BANDAGES/DRESSINGS) ×1 IMPLANT
DERMABOND ADVANCED .7 DNX6 (GAUZE/BANDAGES/DRESSINGS) ×1 IMPLANT
DRAPE C-ARM FOLDED MOBILE STRL (DRAPES) ×3 IMPLANT
ELECT REM PT RETURN 9FT ADLT (ELECTROSURGICAL) ×3
ELECTRODE REM PT RTRN 9FT ADLT (ELECTROSURGICAL) ×1 IMPLANT
GLOVE BIOGEL PI IND STRL 7.0 (GLOVE) ×1 IMPLANT
GLOVE BIOGEL PI INDICATOR 7.0 (GLOVE) ×2
GLOVE SURG SS PI 7.5 STRL IVOR (GLOVE) ×3 IMPLANT
GOWN STRL REUS W/TWL LRG LVL3 (GOWN DISPOSABLE) ×6 IMPLANT
IV NS 500ML (IV SOLUTION) ×2
IV NS 500ML BAXH (IV SOLUTION) ×1 IMPLANT
KIT POWER CATH 8FR (Port) ×3 IMPLANT
KIT ROOM TURNOVER APOR (KITS) ×3 IMPLANT
MANIFOLD NEPTUNE II (INSTRUMENTS) ×3 IMPLANT
NEEDLE HYPO 25X1 1.5 SAFETY (NEEDLE) ×3 IMPLANT
PACK MINOR (CUSTOM PROCEDURE TRAY) ×3 IMPLANT
PAD ARMBOARD 7.5X6 YLW CONV (MISCELLANEOUS) ×3 IMPLANT
SET BASIN LINEN APH (SET/KITS/TRAYS/PACK) ×3 IMPLANT
SET INTRODUCER 12FR PACEMAKER (SHEATH) IMPLANT
SHEATH COOK PEEL AWAY SET 8F (SHEATH) IMPLANT
SUT PROLENE 3 0 PS 2 (SUTURE) IMPLANT
SUT VIC AB 3-0 SH 27 (SUTURE) ×2
SUT VIC AB 3-0 SH 27X BRD (SUTURE) ×1 IMPLANT
SUT VIC AB 4-0 PS2 27 (SUTURE) ×3 IMPLANT
SYR 20CC LL (SYRINGE) ×3 IMPLANT
SYR CONTROL 10ML LL (SYRINGE) ×3 IMPLANT

## 2016-08-13 NOTE — Op Note (Signed)
Patient:  Kevin Kirk  DOB:  [birth year]  MRN:  [id number]   Preop Diagnosis:  Colon carcinoma, need for central venous access  Postop Diagnosis:  Same  Procedure:  Port-A-Cath insertion  Surgeon:  Aviva Signs, M.D.  Anes:  Mac  Indications:  Patient is an 83 year old white male with colon carcinoma who is about to undergo chemotherapy. He needs central venous access. The risks and benefits of the procedure including bleeding, infection, and pneumothorax were fully explained to the patient, who gave informed consent.  Procedure note:  The patient was placed in the supine position. After monitored anesthesia care was given, the left upper chest was prepped and draped using usual sterile technique with DuraPrep. Surgical site confirmation was performed. One percent Xylocaine was used for local anesthesia.  The patient was placed in the Trendelenburg position. An incision was made below the left clavicle. A subcutaneous pocket was formed. The needles advanced into the left subclavian vein using the Seldinger technique without difficulty. A guidewire was advanced into the right atrium under fluoroscopic guidance. An introducer and peel-away sheath were placed over the guidewire. The catheter was then inserted through the peel-away sheath and the peel-away sheath was removed. The catheter was then attached to the port and the port placed in subcutaneous pocket. Adequate positioning was confirmed by fluoroscopy. Good backflow blood was noted on aspiration of the port. The port was flushed with heparin flush. Subcutaneous layer was reapproximated using a 3-0 Vicryl interrupted suture. The skin was closed using a 4-0 Vicryl subcuticular suture. Dermabond was applied.  All tape and needle counts were correct at the end of the procedure. Patient was transferred to PACU in stable condition. A chest x-ray will be performed at that time.  Complications:  None  EBL:  Minimal  Specimen:   None

## 2016-08-13 NOTE — Interval H&P Note (Signed)
History and Physical Interval Note:  08/13/2016 10:33 AM  Kevin Kirk  has presented today for surgery, with the diagnosis of colon cancer  The various methods of treatment have been discussed with the patient and family. After consideration of risks, benefits and other options for treatment, the patient has consented to  Procedure(s): INSERTION PORT-A-CATH (N/A) as a surgical intervention .  The patient's history has been reviewed, patient examined, no change in status, stable for surgery.  I have reviewed the patient's chart and labs.  Questions were answered to the patient's satisfaction.     Aviva Signs

## 2016-08-13 NOTE — Transfer of Care (Signed)
Immediate Anesthesia Transfer of Care Note  Patient: Kevin Kirk  Procedure(s) Performed: Procedure(s): INSERTION PORT-A-CATH (N/A)  Patient Location: PACU  Anesthesia Type:MAC  Level of Consciousness: awake  Airway & Oxygen Therapy: Patient Spontanous Breathing  Post-op Assessment: Report given to RN  Post vital signs: Reviewed  Last Vitals:  Vitals:   08/13/16 1225 08/13/16 1230  BP: 114/66 125/63  Resp: 17 18  Temp:      Last Pain:  Vitals:   08/13/16 1015  TempSrc: Oral      Patients Stated Pain Goal: 7 (84/[id number]/64 [id number])  Complications: No apparent anesthesia complications

## 2016-08-13 NOTE — Anesthesia Preprocedure Evaluation (Signed)
Anesthesia Evaluation  Patient identified by MRN, date of birth, ID band Patient awake    Reviewed: Allergy & Precautions, NPO status , Patient's Chart, lab work & pertinent test results  Airway Mallampati: II  TM Distance: >3 FB Neck ROM: Full    Dental  (+) Partial Upper   Pulmonary sleep apnea , COPD, former smoker, PE   breath sounds clear to auscultation       Cardiovascular hypertension, Pt. on medications + DVT  + Valvular Problems/Murmurs (s/p AVR)  Rhythm:Regular Rate:Normal     Neuro/Psych    GI/Hepatic negative GI ROS, Neg liver ROS,   Endo/Other  diabetes, Type 2, Oral Hypoglycemic Agents  Renal/GU negative Renal ROS     Musculoskeletal   Abdominal   Peds  Hematology   Anesthesia Other Findings   Reproductive/Obstetrics                             Anesthesia Physical Anesthesia Plan  ASA: III  Anesthesia Plan: MAC   Post-op Pain Management:    Induction: Intravenous  Airway Management Planned: Simple Face Mask  Additional Equipment:   Intra-op Plan:   Post-operative Plan:   Informed Consent: I have reviewed the patients History and Physical, chart, labs and discussed the procedure including the risks, benefits and alternatives for the proposed anesthesia with the patient or authorized representative who has indicated his/her understanding and acceptance.     Plan Discussed with:   Anesthesia Plan Comments:         Anesthesia Quick Evaluation

## 2016-08-13 NOTE — Discharge Instructions (Signed)
Implanted Port Home Guide °An implanted port is a type of central line that is placed under the skin. Central lines are used to provide IV access when treatment or nutrition needs to be given through a person’s veins. Implanted ports are used for long-term IV access. An implanted port may be placed because: °· You need IV medicine that would be irritating to the small veins in your hands or arms. °· You need long-term IV medicines, such as antibiotics. °· You need IV nutrition for a long period. °· You need frequent blood draws for lab tests. °· You need dialysis. ° °Implanted ports are usually placed in the chest area, but they can also be placed in the upper arm, the abdomen, or the leg. An implanted port has two main parts: °· Reservoir. The reservoir is round and will appear as a small, raised area under your skin. The reservoir is the part where a needle is inserted to give medicines or draw blood. °· Catheter. The catheter is a thin, flexible tube that extends from the reservoir. The catheter is placed into a large vein. Medicine that is inserted into the reservoir goes into the catheter and then into the vein. ° °How will I care for my incision site? °Do not get the incision site wet. Bathe or shower as directed by your health care provider. °How is my port accessed? °Special steps must be taken to access the port: °· Before the port is accessed, a numbing cream can be placed on the skin. This helps numb the skin over the port site. °· Your health care provider uses a sterile technique to access the port. °? Your health care provider must put on a mask and sterile gloves. °? The skin over your port is cleaned carefully with an antiseptic and allowed to dry. °? The port is gently pinched between sterile gloves, and a needle is inserted into the port. °· Only "non-coring" port needles should be used to access the port. Once the port is accessed, a blood return should be checked. This helps ensure that the port  is in the vein and is not clogged. °· If your port needs to remain accessed for a constant infusion, a clear (transparent) bandage will be placed over the needle site. The bandage and needle will need to be changed every week, or as directed by your health care provider. °· Keep the bandage covering the needle clean and dry. Do not get it wet. Follow your health care provider’s instructions on how to take a shower or bath while the port is accessed. °· If your port does not need to stay accessed, no bandage is needed over the port. ° °What is flushing? °Flushing helps keep the port from getting clogged. Follow your health care provider’s instructions on how and when to flush the port. Ports are usually flushed with saline solution or a medicine called heparin. The need for flushing will depend on how the port is used. °· If the port is used for intermittent medicines or blood draws, the port will need to be flushed: °? After medicines have been given. °? After blood has been drawn. °? As part of routine maintenance. °· If a constant infusion is running, the port may not need to be flushed. ° °How long will my port stay implanted? °The port can stay in for as long as your health care provider thinks it is needed. When it is time for the port to come out, surgery will be   done to remove it. The procedure is similar to the one performed when the port was put in. °When should I seek immediate medical care? °When you have an implanted port, you should seek immediate medical care if: °· You notice a bad smell coming from the incision site. °· You have swelling, redness, or drainage at the incision site. °· You have more swelling or pain at the port site or the surrounding area. °· You have a fever that is not controlled with medicine. ° °This information is not intended to replace advice given to you by your health care provider. Make sure you discuss any questions you have with your health care provider. °Document  Released: 03/10/2005 Document Revised: 08/16/2015 Document Reviewed: 11/15/2012 °Elsevier Interactive Patient Education © 2017 [employer] °Implanted Port Insertion, Care After °This sheet gives you information about how to care for yourself after your procedure. Your health care provider may also give you more specific instructions. If you have problems or questions, contact your health care provider. °What can I expect after the procedure? °After your procedure, it is common to have: °· Discomfort at the port insertion site. °· Bruising on the skin over the port. This should improve over 3-4 days. ° °Follow these instructions at home: °Port care °· After your port is placed, you will get a manufacturer's information card. The card has information about your port. Keep this card with you at all times. °· Take care of the port as told by your health care provider. Ask your health care provider if you or a family member can get training for taking care of the port at home. A home health care nurse may also take care of the port. °· Make sure to remember what type of port you have. °Incision care °· Follow instructions from your health care provider about how to take care of your port insertion site. Make sure you: °? Wash your hands with soap and water before you change your bandage (dressing). If soap and water are not available, use hand sanitizer. °? Change your dressing as told by your health care provider. °? Leave stitches (sutures), skin glue, or adhesive strips in place. These skin closures may need to stay in place for 2 weeks or longer. If adhesive strip edges start to loosen and curl up, you may trim the loose edges. Do not remove adhesive strips completely unless your health care provider tells you to do that. °· Check your port insertion site every day for signs of infection. Check for: °? More redness, swelling, or pain. °? More fluid or blood. °? Warmth. °? Pus or a bad smell. °General  instructions °· Do not take baths, swim, or use a hot tub until your health care provider approves. °· Do not lift anything that is heavier than 10 lb (4.5 kg) for a week, or as told by your health care provider. °· Ask your health care provider when it is okay to: °? Return to work or school. °? Resume usual physical activities or sports. °· Do not drive for 24 hours if you were given a medicine to help you relax (sedative). °· Take over-the-counter and prescription medicines only as told by your health care provider. °· Wear a medical alert bracelet in case of an emergency. This will tell any health care providers that you have a port. °· Keep all follow-up visits as told by your health care provider. This is important. °Contact a health care provider if: °· You cannot   flush your port with saline as directed, or you cannot draw blood from the port. °· You have a fever or chills. °· You have more redness, swelling, or pain around your port insertion site. °· You have more fluid or blood coming from your port insertion site. °· Your port insertion site feels warm to the touch. °· You have pus or a bad smell coming from the port insertion site. °Get help right away if: °· You have chest pain or shortness of breath. °· You have bleeding from your port that you cannot control. °Summary °· Take care of the port as told by your health care provider. °· Change your dressing as told by your health care provider. °· Keep all follow-up visits as told by your health care provider. °This information is not intended to replace advice given to you by your health care provider. Make sure you discuss any questions you have with your health care provider. °Document Released: 12/29/2012 Document Revised: 01/30/2016 Document Reviewed: 01/30/2016 °Elsevier Interactive Patient Education © 2017 [employer] ° °

## 2016-08-13 NOTE — Anesthesia Postprocedure Evaluation (Signed)
Anesthesia Post Note  Patient: Kevin Kirk  Procedure(s) Performed: Procedure(s) (LRB): INSERTION PORT-A-CATH (N/A)  Patient location during evaluation: PACU Anesthesia Type: MAC Level of consciousness: awake and alert and oriented Pain management: pain level controlled Vital Signs Assessment: post-procedure vital signs reviewed and stable Respiratory status: spontaneous breathing Cardiovascular status: blood pressure returned to baseline Postop Assessment: no signs of nausea or vomiting Anesthetic complications: no     Last Vitals:  Vitals:   08/13/16 1325 08/13/16 1330  BP: 117/65 116/65  Pulse: 71 73  Resp:  14  Temp: 36.5 C     Last Pain:  Vitals:   08/13/16 1325  TempSrc:   PainSc: 0-No pain                 [name]

## 2016-08-14 ENCOUNTER — Telehealth (HOSPITAL_COMMUNITY): Payer: Self-pay | Admitting: Emergency Medicine

## 2016-08-14 ENCOUNTER — Encounter (HOSPITAL_COMMUNITY): Payer: Self-pay | Admitting: Emergency Medicine

## 2016-08-14 NOTE — Progress Notes (Signed)
Chemotherapy teaching pulled together and appts made. 

## 2016-08-14 NOTE — Telephone Encounter (Signed)
Called pt to introduce myself.  He had his port put in yesterday and he said everything is as good as peaches and ice cream.  I explained about the medications that we had called in that he did not need to take the two nausea medications until after he started chemo unless he gets nausea.  The cream for the port he applies about a quarter sized amount 1 hour prior to chemo and then cover with plastic wrap.  He verbalized understanding.

## 2016-08-14 NOTE — Patient Instructions (Signed)
[place]   CHEMOTHERAPY INSTRUCTIONS  You have been diagnosed with Stage 3 Colon Cancer.  We are going to treat you with the regimen FOLFOX.  1 cycle is every 2 weeks.  You will have 12 cycles.  You will have a pump that you take home home for 46 hours.  This treatment is with curative intent.  You will see the doctor regularly throughout treatment.  We monitor your lab work prior to every treatment.  The doctor monitors your response to treatment by the way you are feeling, your blood work, and scans periodically.   You will have pre-medications that you receive prior to each chemotherapy: Premeds: Aloxi - high powered nausea/vomiting prevention medication used for chemotherapy patients.  Dexamethasone - steroid - given to reduce the risk of you having an allergic type reaction to the chemotherapy. Dex can cause you to feel energized, nervous/anxious/jittery, make you have trouble sleeping, and/or make you feel hot/flushed in the face/neck and/or look pink/red in the face/neck. These side effects will pass as the Dex wears off. (takes 20 minutes to infuse)   POTENTIAL SIDE EFFECTS OF TREATMENT:  5-Fluorouracil (Adrucil)  About This Drug Fluorouracil is used to treat cancer. It is given in the vein (IV).  This is the drug you will take home in your pump for 46 hours.    Possible Side Effects . Hair loss. Hair loss is often temporary, although with certain medicine, hair loss can sometimes be permanent. Hair loss may happen suddenly or gradually. If you lose hair, you may lose it from your head, face, armpits, pubic area, chest, and/or legs. You may also notice your hair getting thin. . Changes in your nail color, nail loss and/or brittle nail . Darkening of the skin, or changes to the color of your skin and/or veins used for infusion . Rash, itching . Nausea and throwing up (vomiting) . Loose bowel movements (diarrhea) . Ulcers - sores that may cause  pain or bleeding in your digestive tract, which includes your mouth, esophagus, stomach, small/large intestines and rectum . Soreness of the mouth and throat. You may have red areas, white patches, or sores that hurt. . Decreased appetite (decreased hunger) . Changes in the tissue of the heart and/or heart attack. Some changes may happen that can cause your heart to have less ability to pump blood. . Bone marrow depression. This is a decrease in the number of white blood cells, red blood cells, and platelets. This may raise your risk of infection, make you tired and weak (fatigue), and raise your risk of bleeding . Sensitivity to light (photosensitivity). Photosensitivity means that you may become more sensitive to the sun and/or light. Your eyes may water more, mostly in bright light. . Allergic reaction: Allergic reactions, including anaphylaxis are rare but may happen in some patients. Signs of allergic reaction to this drug may be swelling of the face, feeling like your tongue or throat are swelling, trouble breathing, rash, itching, fever, chills, feeling dizzy, and/or feeling that your heart is beating in a fast or not normal way. If this happens, do not take another dose of this drug. You should get urgent medical treatment. . Blurred vision or other changes in eyesight Note: Not all possible side effects are included above.  Warnings and Precautions . Hand-and-foot syndrome. The palms of your hands or soles of your feet may tingle, become numb, painful, swollen, or red. . Changes in your central nervous system can  happen. The central nervous system is made up of your brain and spinal cord. You could feel extreme tiredness, agitation, confusion, hallucinations (see or hear things that are not there), trouble understanding or speaking, loss of control of your bowels or bladder, eyesight changes, numbness or lack of strength to your arms, legs, face, or body, and coma. If you start to  have any of these symptoms let your doctor know right away. . Side effects of this drug may be unexpectedly severe in some patients Note: Some of the side effects above are very rare. If you have concerns and/or questions, please discuss them with your medical team.  Important Information . This drug may be present in the saliva, tears, sweat, urine, stool, vomit, semen, and vaginal secretions. Talk to your doctor and/or your nurse about the necessary precautions to take during this time.  Treating Side Effects . To help with hair loss, wash with a mild shampoo and avoid washing your hair every day. . Avoid rubbing your scalp, pat your hair or scalp dry. . Avoid coloring your hair. . Limit your use of hair spray, electric curlers, blow dryers, and curling irons. . If you are interested in getting a wig, talk to your nurse. You can also call the [facility] at 800-ACS-2345 to find out information about the "Look Good, Feel Better" program close to where you live. It is a free program where women getting chemotherapy can learn about wigs, turbans and scarves as well as makeup techniques and skin and nail care. Marland Kitchen Keeping your nails moisturized may help with brittleness. . To help with itching, moisturize your skin several times day. . Avoid sun exposure and apply sunscreen routinely when outdoors. . If you get a rash do not put anything on it unless your doctor or nurse says you may. Keep the area around the rash clean and dry. Ask your doctor for medicine if your rash bothers you. . To help with decreased appetite, eat small, frequent meals. . Eat high caloric food such as pudding, ice cream, yogurt and milkshakes. . Drink plenty of fluids (a minimum of eight glasses per day is recommended). . If you throw up or have loose bowel movements, you should drink more fluids so that you do not become dehydrated (lack water in the body from losing too much fluid). . To help with nausea  and vomiting, eat small, frequent meals instead of three large meals a day. Choose foods and drinks that are at room temperature. Ask your nurse or doctor about other helpful tips and medicine that is available to help or stop lessen these symptoms. . If you get diarrhea, eat low-fiber foods that are high in protein and calories and avoid foods that can irritate your digestive tracts or lead to cramping. . Ask your nurse or doctor about medicine that can lessen or stop your diarrhea. . Mouth care is very important. Your mouth care should consist of routine, gentle cleaning of your teeth or dentures and rinsing your mouth with a mixture of 1/2 teaspoon of salt in 8 ounces of water or  teaspoon of baking soda in 8 ounces of water. This should be done at least after each meal and at bedtime. . If you have mouth sores, avoid mouthwash that has alcohol. Also avoid alcohol and smoking because they can bother your mouth and throat. . Manage tiredness by pacing your activities for the day. . Be sure to include periods of rest between energy-draining activities. Marland Kitchen  To help decrease your risk of infections, wash your hands regularly. . Avoid close contact with people who have a cold, the flu, or other infections. . Use a soft toothbrush. Check with your nurse before using dental floss. . Be very careful when using knives or tools. . Use an electric shaver instead of a razor.  Food and Drug Interactions . There are no known interactions of fluorouracil with food. . Check with your doctor or pharmacist about all other prescription medicines and dietary supplements you are taking before starting this medicine as there are a lot of known drug interactions with fluorouracil. Also, check with your doctor or pharmacist before starting any new prescription or over-the-counter medicines, or dietary supplement to make sure that there are no interactions.  When to Call the Doctor Call your doctor or nurse if  you have any of these symptoms and/or any new or unusual symptoms: . Fever of 100.5 F (38 C) or higher . Chills . Easy bleeding or bruising . Trouble breathing . Feeling dizzy or lightheaded . Feeling that your heart is beating in a fast or not normal way (palpitations) . Chest pain or symptoms of a heart attack. Most heart attacks involve pain in the center of the chest that lasts more than a few minutes. The pain may go away and come back or it can be constant. It can feel like pressure, squeezing, fullness, or pain. Sometimes pain is felt in one or both arms, the back, neck, jaw, or stomach. If any of these symptoms last 2 minutes, call 911. Marland Kitchen Confusion and/or agitation . Hallucinations . Trouble understanding or speaking . Blurry vision or changes in your eyesight . Numbness or lack of strength to your arms, legs, face, or body . Nausea that stops you from eating or drinking and/or is not relieved by prescribed medicines . Throwing up more than 3 times a day . Loose bowel movements (diarrhea) 4 times a day or loose bowel movements with lack of strength or a feeling of being dizzy . Lasting loss of appetite or rapid weight loss of five pounds in a week . Pain in your mouth or throat that makes it hard to eat or drink . Pain along the digestive tract - especially if worse after eating . Blood in your vomit (bright red or coffee-ground) and/or stools (bright red, or black/tarry) . Coughing up blood . Fatigue that interferes with your daily activities . Painful, red, or swollen areas on your hands or feet . Numbness and/or tingling of your hands and/or feet . Signs of allergic reaction: swelling of the face, feeling like your tongue or throat are swelling, trouble breathing, rash, itching, fever, chills, feeling dizzy, and/or feeling that your heart is beating in a fast or not normal way . If you think you are pregnant or may have impregnated your partner  Reproduction Warnings .  Pregnancy warning: This drug may have harmful effects on the unborn baby. Women of child bearing potential should use effective methods of birth control during your cancer treatment. Let your doctor know right away if you think you may be pregnant. . Breastfeeding warning: It is not known if this drug passes into breast milk. For this reason, women should talk to their doctor about the risks and benefits of breast feeding during treatment with this drug because this drug may enter the breast milk and cause harm to a breast feeding baby. . Fertility warning: In men and women both, this drug may affect  your ability to have children in the future. Talk with your doctor or nurse if you plan to have children. Ask for information on sperm or egg banking.   Leucovorin Calcium (Generic Name) Other Names: Leucovorin, Wellcovorin, Folinic Acid, Citrovorum Factor  About This Drug Leucovorin is a vitamin. It is used in combination with other cancer fighting drugs such as 5-fluorouracil and methotrexate. Leucovorin helps 5-fluorouracil kill the cancer cells. It also helps to reduce the side effects of methotrexate. Leucovorin is given in the vein (IV) or by mouth (orally).  Will take 2 hours to infuse.  Possible Side Effects . Rash . Wheezing Leucovorin by itself has very few side effects. Side effects you may have can be caused by the other drugs you are taking, such as 5-fluorouracil or methotrexate.  Allergic Reactions . Allergic reactions to this drug are rare. While you are getting this drug by in your vein (IV), please tell your nurse right away if you have any of these symptoms of an allergic reaction: . Trouble catching your breath . Feeling like your tongue or throat are swelling . Feeling your heart beat quickly or in a not normal way (palpitations) . Feeling dizzy or lightheaded . Flushing, itching, rash, and/or hives . If you are taking the oral form of leucovorin and you have these  symptoms, do not take another dose of this drug and get urgent medical treatment.  Treating Side Effects If you get a rash do not put anything on it unless your doctor or nurse says you may. Keep the area around the rash clean and dry. Ask your doctor for medicine if your rash bothers you.  Food and Drug Interactions There are no known interactions of leucovorin with food. This drug may interact with other medicines. Tell your doctor and pharmacist about all the medicines and dietary supplements (vitamins, minerals, herbs and others) that you are taking at this time. The safety and use of dietary supplements and alternative diets are often not known. Using these might affect your cancer or interfere with your treatment. Until more is known, you should not use dietary supplements or alternative diets without your cancer doctor's help.  When to Call the Doctor . Call your doctor or nurse right away if you have any of these symptoms: . Wheezing or trouble breathing . Rash with or without itching, redness, or hives . If you take leucovorin by mouth, please also call your doctor right away if you have: Marland Kitchen Throwing up (vomiting) . Loose bowel movements (diarrhea)  Reproduction Concerns . Pregnancy warning: It is not known if this drug may harm an unborn child. For this reason, be sure to talk with your doctor if you are pregnant or planning to become pregnant while getting this drug. . Genetic counseling is available for you to talk about the effects of this drug therapy on future pregnancies. Also, a genetic counselor can look at the possible risk of problems in the unborn baby due to this medicine if an exposure happens during pregnancy. . Breast feeding warning: It is not known if this drug passes into breast milk. For this reason, women should talk to their doctor about the risks and benefits of breast feeding during treatment with this drug because this drug may enter the breast milk  and badly harm a breast feeding baby.    Oxaliplatin (Generic Name) Other Names: EloxatinTM  About This Drug Oxaliplatin is used to treat cancer. It is given in the vein (IV).  Will  take 2 hours to infuse and will infuse with the leucovorin.    Possible Side Effects (More Common) . Effects on the nerves are called peripheral neuropathy. You may feel numbness, tingling, or pain in your hands and feet. Cold temperatures can make it worse. Avoid cold drinks with ice. Dress warmly and cover the skin if you go out in the cold. Wear socks or gloves if you have to touch cold objects like cold flooring or items in the refrigerator or freezer. . It may be hard for you to button your clothes, open jars, or walk as usual. The effect on the nerves may get worse with more doses of the drug. These effects get better in some people after the drug is stopped but it does not get better in all people . Bone marrow depression: This is a decrease in the number of white blood cells, red blood cells, and platelets. It may increase your risk for infection, make you tired and weak (fatigue), and raise your risk of bleeding. . Nausea and throwing up (vomiting). These symptoms may happen within a few hours after your treatment and may last up to 72 hours. Medicines are available to stop or lessen these side effects. . Electrolyte changes. Your blood will be checked for electrolyte changes as needed.  Possible Side Effects (Less Common) . Skin and tissue irritation: This may involve redness, pain, warmth, or swelling at the IV site. This happens if the drug leaks out of the vein and into nearby tissue. . Serious allergic reactions including anaphylaxis are rare. While you are getting this drug in your vein (IV), tell your nurse right away if you have any of these symptoms of an allergic reaction: . Trouble catching your breath. . Feeling like your tongue or throat are swelling. . Feeling your heart beat quickly  or in a not normal way (palpitations). . Feeling dizzy or lightheaded. . Flushing, itching, rash, or hives. . Changes in your liver function. Your doctor will check your liver function as needed.  Treating Side Effects Oxaliplatin (Generic Name) continued Page 12 of 14 . Do not drink cold drinks or use ice cubes in drinks. Drink fluids at room temperature or warmer. Drink through a straw. . Wear gloves to touch cold objects. Be aware that most metals are cold to the touch, especially in winter. Examples are your car door handle and your mail box latch. . Wear warm clothing in cold weather at all times. Cover your mouth and nose with a scarf or a pulldown cap (ski cap) to warm the air that goes to your lungs. . Ask your doctor or nurse about medicine to treat nausea, throwing up, headache, or loose bowel movements. . While you are getting this drug in your vein, tell your nurse right away if you have any pain, redness, or swelling at the site of the IV infusion. . Mouth care is very important. Your mouth care should consist of routine, gentle cleaning of your teeth or dentures and rinsing your mouth with a mixture of 1/2 teaspoon of salt in 8 ounces of water or  teaspoon of baking soda in 8 ounces of water. This should be done at least after each meal and at bedtime. . Drink 6-8 cups of fluids each day unless your doctor has told you to limit your fluid intake due to some other health problem. A cup is 8 ounces of fluid. If you throw up or have loose bowel movements you should drink more  fluids so that you do not become dehydrated (lack water in the body due to losing too much fluid).  Food and Drug Interactions There are no known interactions of oxaliplatin with food. This drug may interact with other medicines. Tell your doctor and pharmacist about all the medicines and dietary supplements (vitamins, minerals, herbs and others) that you are taking at this time. The safety and use of  dietary supplements and alternative diets are often not known. Using these might affect your cancer or interfere with your treatment. Until more is known, you should not use dietary supplements or alternative diets without your cancer doctor's help.  When to Call the Doctor Call your doctor or nurse right away if you have any of these symptoms: . Fever of 100.5 F (38 C) or above . Chills . Easy bruising or bleeding . Wheezing or trouble breathing . Rash or itching . Feeling dizzy or lightheaded . Feeling that your heart is beating in a fast or not normal way (palpitations) . Loose bowel movements (diarrhea) more than 4 times a day or diarrhea with weakness or feeling lightheaded . Blurred vision or other changes in eyesight . Pain when passing urine; blood in urine . Pain in your lower back or side . Feeling confused or agitated . Nausea that stops you from eating or drinking . Throwing up more than 3 times a day . Chest pain or symptoms of a heart attack. Most heart attacks involve pain in the center of the chest that lasts more than a few minutes. The pain may go away and come back. It can feel like pressure, squeezing, fullness, or pain. Sometimes pain is felt in one or both arms, the back, neck, jaw, or stomach. If any of these symptoms last 2 minutes, call 911. Marland Kitchen Symptoms of a stroke such as sudden numbness or weakness of your face, arm, or leg, mostly on one side of your body; sudden confusion, trouble speaking or understanding; sudden trouble seeing in one or both eyes; sudden trouble walking, feeling dizzy, loss of balance or coordination; or sudden, bad headache with no known cause. If you have any of these symptoms for 2 minutes, call 911. . Signs of liver problems: dark urine, pale bowel movements, bad stomach pain, feeling very tired and weak, itching, or yellowing of the eyes or skin. Call your doctor or nurse as soon as possible if any of these symptoms happen: .  Change in hearing, ringing in the ears . Decreased urine . Unusual thirst or passing urine often . Pain in your mouth or throat that makes it hard to eat or drink . Nausea that is not relieved by prescribed medicines . Rash that is not relieved by prescribed medicines . Heavy menstrual period that lasts longer than normal . Numbness, tingling, decreased feeling or weakness in fingers, toes, arms, or legs . Trouble walking or changes in the way you walk, feeling clumsy when buttoning clothes, opening jars, or other routine hand motions . Swelling of legs, ankles, or feet . Weight gain of 5 pounds in one week (fluid retention) . Lasting loss of appetite or rapid weight loss of five pounds in a week . Fatigue that interferes with your daily activities . Headache that does not go away . Painful, red, or swollen areas on your hands or feet. . No bowel movement for 3 days or you feel uncomfortable . Extreme weakness that interferes with normal activities  Reproduction Concerns Infertility Warning: Sexual problems and reproduction concerns  may happen. In both men and women, this drug may affect your ability to have children. This cannot be determined before your treatment. Talk with your doctor or nurse if you plan to have children. Ask for information on sperm or egg banking. In men, this drug may interfere with your ability to make sperm, but it should not change your ability to have sexual relations. In women, menstrual bleeding may become irregular or stop while you are getting this drug. Do not assume that you cannot become pregnant if you do not have a menstrual period. Women may go through signs of menopause (change of life) like vaginal dryness or itching. Vaginal lubricants can be used to lessen vaginal dryness, itching, and pain during sexual relations. Genetic counseling is available for you to talk about the effects of this drug therapy on future pregnancies. Also, a genetic  counselor can look at the possible risk of problems in the unborn baby due to this medicine if an exposure happens during pregnancy. Pregnancy warning: This drug may have harmful effects on the unborn child, so effective methods of birth control should be used during your cancer treatment. Breast feeding warning: It is not known if this drug passes into breast milk. For this reason, women should talk to their doctor about the risks and benefits of breast feeding during treatment with this drug because this drug may enter the breast milk and badly harm a breast feeding baby.    SELF CARE ACTIVITIES WHILE ON CHEMOTHERAPY: Hydration Increase your fluid intake 48 hours prior to treatment and drink at least 8 to 12 cups (64 ounces) of water/decaff beverages per day after treatment. You can still have your cup of coffee or soda but these beverages do not count as part of your 8 to 12 cups that you need to drink daily. No alcohol intake.  Medications Continue taking your normal prescription medication as prescribed.  If you start any new herbal or new supplements please let us know first to make sure it is safe.  Mouth Care Have teeth cleaned professionally before starting treatment. Keep dentures and partial plates clean. Use soft toothbrush and do not use mouthwashes that contain alcohol. Biotene is a good mouthwash that is available at most pharmacies or may be ordered by calling [phone]. Use warm salt water gargles (1 teaspoon salt per 1 quart warm water) before and after meals and at bedtime. Or you may rinse with 2 tablespoons of three-percent hydrogen peroxide mixed in eight ounces of water. If you are still having problems with your mouth or sores in your mouth please call the clinic. If you need dental work, please let the doctor know before you go for your appointment so that we can coordinate the best possible time for you in regards to your chemo regimen. You need to also let your  dentist know that you are actively taking chemo. We may need to do labs prior to your dental appointment.   Skin Care Always use sunscreen that has not expired and with SPF (Sun Protection Factor) of 50 or higher. Wear hats to protect your head from the sun. Remember to use sunscreen on your hands, ears, face, & feet.  Use good moisturizing lotions such as udder cream, eucerin, or even Vaseline. Some chemotherapies can cause dry skin, color changes in your skin and nails.    . Avoid long, hot showers or baths. . Use gentle, fragrance-free soaps and laundry detergent. . Use moisturizers, preferably creams or ointments  rather than lotions because the thicker consistency is better at preventing skin dehydration. Apply the cream or ointment within 15 minutes of showering. Reapply moisturizer at night, and moisturize your hands every time after you wash them.  Hair Loss (if your doctor says your hair will fall out)  . If your doctor says that your hair is likely to fall out, decide before you begin chemo whether you want to wear a wig. You may want to shop before treatment to match your hair color. . Hats, turbans, and scarves can also camouflage hair loss, although some people prefer to leave their heads uncovered. If you go bare-headed outdoors, be sure to use sunscreen on your scalp. . Cut your hair short. It eases the inconvenience of shedding lots of hair, but it also can reduce the emotional impact of watching your hair fall out. . Don't perm or color your hair during chemotherapy. Those chemical treatments are already damaging to hair and can enhance hair loss. Once your chemo treatments are done and your hair has grown back, it's OK to resume dyeing or perming hair. With chemotherapy, hair loss is almost always temporary. But when it grows back, it may be a different color or texture. In older adults who still had hair color before chemotherapy, the new growth may be completely gray.  Often, new  hair is very fine and soft.  Infection Prevention Please wash your hands for at least 30 seconds using warm soapy water. Handwashing is the #1 way to prevent the spread of germs. Stay away from sick people or people who are getting over a cold. If you develop respiratory systems such as green/yellow mucus production or productive cough or persistent cough let us know and we will see if you need an antibiotic. It is a good idea to keep a pair of gloves on when going into grocery stores/[employer] to decrease your risk of coming into contact with germs on the carts, etc. Carry alcohol hand gel with you at all times and use it frequently if out in public. If your temperature reaches 100.5 or higher please call the clinic and let us know.  If it is after hours or on the weekend please go to the ER if your temperature is over 100.5.  Please have your own personal thermometer at home to use.    Sex and bodily fluids If you are going to have sex, a condom must be used to protect the person that isn't taking chemotherapy. Chemo can decrease your libido (sex drive). For a few days after chemotherapy, chemotherapy can be excreted through your bodily fluids.  When using the toilet please close the lid and flush the toilet twice.  Do this for a few day after you have had chemotherapy.     Effects of chemotherapy on your sex life Some changes are simple and won't last long. They won't affect your sex life permanently. Sometimes you may feel: . too tired . not strong enough to be very active . sick or sore  . not in the mood . anxious or low Your anxiety might not seem related to sex. For example, you may be worried about the cancer and how your treatment is going. Or you may be worried about money, or about how you family are coping with your illness. These things can cause stress, which can affect your interest in sex. It's important to talk to your partner about how you feel. Remember - the changes to your sex  life don't usually last long. There's usually no medical reason to stop having sex during chemo. The drugs won't have any long term physical effects on your performance or enjoyment of sex. Cancer can't be passed on to your partner during sex  Contraception It's important to use reliable contraception during treatment. Avoid getting pregnant while you or your partner are having chemotherapy. This is because the drugs may harm the baby. Sometimes chemotherapy drugs can leave a man or woman infertile.  This means you would not be able to have children in the future. You might want to talk to someone about permanent infertility. It can be very difficult to learn that you may no longer be able to have children. Some people find counselling helpful. There might be ways to preserve your fertility, although this is easier for men than for women. You may want to speak to a fertility expert. You can talk about sperm banking or harvesting your eggs. You can also ask about other fertility options, such as donor eggs. If you have or have had breast cancer, your doctor might advise you not to take the contraceptive pill. This is because the hormones in it might affect the cancer.  It is not known for sure whether or not chemotherapy drugs can be passed on through semen or secretions from the vagina. Because of this some doctors advise people to use a barrier method if you have sex during treatment. This applies to vaginal, anal or oral sex. Generally, doctors advise a barrier method only for the time you are actually having the treatment and for about a week after your treatment. Advice like this can be worrying, but this does not mean that you have to avoid being intimate with your partner. You can still have close contact with your partner and continue to enjoy sex.  Animals If you have cats or birds we just ask that you not change the litter or change the cage.  Please have someone else do this for you while you  are on chemotherapy.   Food Safety During and After Cancer Treatment Food safety is important for people both during and after cancer treatment. Cancer and cancer treatments, such as chemotherapy, radiation therapy, and stem cell/bone marrow transplantation, often weaken the immune system. This makes it harder for your body to protect itself from foodborne illness, also called food poisoning. Foodborne illness is caused by eating food that contains harmful bacteria, parasites, or viruses.  Foods to avoid Some foods have a higher risk of becoming tainted with bacteria. These include: Marland Kitchen Unwashed fresh fruit and vegetables, especially leafy vegetables that can hide dirt and other contaminants . Raw sprouts, such as alfalfa sprouts . Raw or undercooked beef, especially ground beef, or other raw or undercooked meat and poultry . Fatty, fried, or spicy foods immediately before or after treatment.  These can sit heavy on your stomach and make you feel nauseous. . Raw or undercooked shellfish, such as oysters. . Sushi and sashimi, which often contain raw fish.  . Unpasteurized beverages, such as unpasteurized fruit juices, raw milk, raw yogurt, or cider . Undercooked eggs, such as soft boiled, over easy, and poached; raw, unpasteurized eggs; or foods made with raw egg, such as homemade raw cookie dough and homemade mayonnaise Simple steps for food safety Shop smart. . Do not buy food stored or displayed in an unclean area. . Do not buy bruised or damaged fruits or vegetables. . Do not buy cans that have cracks, dents,  or bulges. . Pick up foods that can spoil at the end of your shopping trip and store them in a cooler on the way home. Prepare and clean up foods carefully. . Rinse all fresh fruits and vegetables under running water, and dry them with a clean towel or paper towel. . Clean the top of cans before opening them. . After preparing food, wash your hands for 20 seconds with hot water and  soap. Pay special attention to areas between fingers and under nails. . Clean your utensils and dishes with hot water and soap. Marland Kitchen Disinfect your kitchen and cutting boards using 1 teaspoon of liquid, unscented bleach mixed into 1 quart of water.   Dispose of old food. . Eat canned and packaged food before its expiration date (the "use by" or "best before" date). . Consume refrigerated leftovers within 3 to 4 days. After that time, throw out the food. Even if the food does not smell or look spoiled, it still may be unsafe. Some bacteria, such as Listeria, can grow even on foods stored in the refrigerator if they are kept for too long. Take precautions when eating out. . At restaurants, avoid buffets and salad bars where food sits out for a long time and comes in contact with many people. Food can become contaminated when someone with a virus, often a norovirus, or another "bug" handles it. . Put any leftover food in a "to-go" container yourself, rather than having the server do it. And, refrigerate leftovers as soon as you get home. . Choose restaurants that are clean and that are willing to prepare your food as you order it cooked.    MEDICATIONS: Zofran/Ondansetron 8mg  tablet. Take 1 tablet every 8 hours as needed for nausea/vomiting. (#1 nausea med to take, this can constipate)  Compazine/Prochlorperazine 10mg  tablet. Take 1 tablet every 6 hours as needed for nausea/vomiting. (#2 nausea med to take, this can make you sleepy)   EMLA cream. Apply a quarter size amount to port site 1 hour prior to chemo. Do not rub in. Cover with plastic wrap.   Over-the-Counter Meds:  Miralax 1 capful in 8 oz of fluid daily. May increase to two times a day if needed. This is a stool softener. If this doesn't work proceed you can add:  Senokot S-start with 1 tablet two times a day and increase to 4 tablets two times a day if needed. (total of 8 tablets in a 24 hour period). This is a stimulant laxative.    Call us if this does not help your bowels move.   Imodium 2mg  capsule. Take 2 capsules after the 1st loose stool and then 1 capsule every 2 hours until you go a total of 12 hours without having a loose stool. Call the [facility] if loose stools continue. If diarrhea occurs @ bedtime, take 2 capsules @ bedtime. Then take 2 capsules every 4 hours until morning. Call [facility].     Constipation Sheet *Miralax in 8 oz of fluid daily.  May increase to two times a day if needed.  This is a stool softener.  If this not enough to keep your bowel regular:  You can add:  *Senokot S, start with one tablet twice a day and can increase to 4 tablets twice a day if needed.  This is a stimulant laxative.   Sometimes when you take pain medication you need BOTH a medicine to keep your stool soft and a medicine to help your bowel push  it out!  Please call if the above does not work for you.   Do not go more than 2 days without a bowel movement.  It is very important that you do not become constipated.  It will make you feel sick to your stomach (nausea) and can cause abdominal pain and vomiting.    Diarrhea Sheet  If you are having loose stools/diarrhea, please purchase Imodium and begin taking as outlined:  At the first sign of poorly formed or loose stools you should begin taking Imodium(loperamide) 2 mg capsules.  Take two caplets (4mg ) followed by one caplet (2mg ) every 2 hours until you have had no diarrhea for 12 hours.  During the night take two caplets (4mg ) at bedtime and continue every 4 hours during the night until the morning.  Stop taking Imodium only after there is no sign of diarrhea for 12 hours.    Always call the [facility] if you are having loose stools/diarrhea that you can't get under control.  Loose stools/disrrhea leads to dehydration (loss of water) in your body.  We have other options of trying to get the loose stools/diarrhea to stopped but you must let us  know!    Nausea Sheet  Zofran/Ondansetron 8mg  tablet. Take 1 tablet every 8 hours as needed for nausea/vomiting. (#1 nausea med to take, this can constipate)  Compazine/Prochlorperazine 10mg  tablet. Take 1 tablet every 6 hours as needed for nausea/vomiting. (#2 nausea med to take, this can make you sleepy)  You can take these medications together or separately.  We would first like for you to try the Ondansetron by itself and then take the Prochloperizine if needed. But you are allowed to take both medications at the same time if your nausea is that severe.  If you are having persistent nausea (nausea that does not stop) please take these medications on a staggered schedule so that the nausea medication stays in your body.  Please call the [facility] and let us know the amount of nausea that you are experiencing.  If you begin to vomit, you need to call the [facility] and if it is the weekend and you have vomited more than one time and cant get it to stop-go to the Emergency Room.  Persistent nausea/vomiting can lead to dehydration (loss of fluid in your body) and will make you feel terrible.   Ice chips, sips of clear liquids, foods that are @ room temperature, crackers, and toast tend to be better tolerated.     SYMPTOMS TO REPORT AS SOON AS POSSIBLE AFTER TREATMENT:  FEVER GREATER THAN 100.5 F  CHILLS WITH OR WITHOUT FEVER  NAUSEA AND VOMITING THAT IS NOT CONTROLLED WITH YOUR NAUSEA MEDICATION  UNUSUAL SHORTNESS OF BREATH  UNUSUAL BRUISING OR BLEEDING  TENDERNESS IN MOUTH AND THROAT WITH OR WITHOUT PRESENCE OF ULCERS  URINARY PROBLEMS  BOWEL PROBLEMS  UNUSUAL RASH    Wear comfortable clothing and clothing appropriate for easy access to any Portacath or PICC line. Let us know if there is anything that we can do to make your therapy better!     What to do if you need assistance after hours or on the weekends: CALL [phone].  HOLD on the line, do not hang up.  You  will hear multiple messages but at the end you will be connected with a nurse triage line.  They will contact the doctor if necessary.  Most of the time they will be able to assist you.  Do  not call the hospital operator.       I have been informed and understand all of the instructions given to me and have received a copy. I have been instructed to call the clinic [phone] or my family physician as soon as possible for continued medical care, if indicated. I do not have any more questions at this time but understand that I may call the [facility] or the Patient Navigator at [phone] during office hours should I have questions or need assistance in obtaining follow-up care.

## 2016-08-15 ENCOUNTER — Encounter (HOSPITAL_COMMUNITY): Payer: Self-pay | Admitting: General Surgery

## 2016-08-26 ENCOUNTER — Encounter (HOSPITAL_BASED_OUTPATIENT_CLINIC_OR_DEPARTMENT_OTHER): Payer: Medicare Other | Admitting: Oncology

## 2016-08-26 ENCOUNTER — Encounter: Payer: Self-pay | Admitting: *Deleted

## 2016-08-26 ENCOUNTER — Encounter (HOSPITAL_COMMUNITY): Payer: Medicare Other | Attending: Oncology

## 2016-08-26 ENCOUNTER — Encounter (HOSPITAL_COMMUNITY): Payer: Self-pay

## 2016-08-26 VITALS — BP 135/81 | HR 71 | Temp 97.8°F | Resp 18 | Ht 73.0 in | Wt 208.0 lb

## 2016-08-26 VITALS — BP 135/81 | HR 71 | Temp 97.8°F | Resp 18 | Wt 208.4 lb

## 2016-08-26 DIAGNOSIS — D509 Iron deficiency anemia, unspecified: Secondary | ICD-10-CM | POA: Diagnosis present

## 2016-08-26 DIAGNOSIS — C184 Malignant neoplasm of transverse colon: Secondary | ICD-10-CM

## 2016-08-26 DIAGNOSIS — Z5111 Encounter for antineoplastic chemotherapy: Secondary | ICD-10-CM

## 2016-08-26 LAB — IRON AND TIBC
Iron: 39 ug/dL — ABNORMAL LOW (ref 45–182)
Saturation Ratios: 11 % — ABNORMAL LOW (ref 17.9–39.5)
TIBC: 358 ug/dL (ref 250–450)
UIBC: 319 ug/dL

## 2016-08-26 LAB — COMPREHENSIVE METABOLIC PANEL
ALT: 17 U/L (ref 17–63)
AST: 23 U/L (ref 15–41)
Albumin: 3.8 g/dL (ref 3.5–5.0)
Alkaline Phosphatase: 38 U/L (ref 38–126)
Anion gap: 8 (ref 5–15)
BUN: 14 mg/dL (ref 6–20)
CO2: 26 mmol/L (ref 22–32)
Calcium: 9 mg/dL (ref 8.9–10.3)
Chloride: 104 mmol/L (ref 101–111)
Creatinine, Ser: 0.84 mg/dL (ref 0.61–1.24)
GFR calc Af Amer: >60 mL/min (ref >60)
GFR calc non Af Amer: >60 mL/min (ref >60)
Glucose, Bld: 136 mg/dL — ABNORMAL HIGH (ref 65–99)
Potassium: 3.7 mmol/L (ref 3.5–5.1)
Sodium: 138 mmol/L (ref 135–145)
Total Bilirubin: 0.6 mg/dL (ref 0.3–1.2)
Total Protein: 7.4 g/dL (ref 6.5–8.1)

## 2016-08-26 LAB — CBC WITH DIFFERENTIAL/PLATELET
Basophils Absolute: 0 10*3/uL (ref 0.0–0.1)
Basophils Relative: 0 %
Eosinophils Absolute: 0.2 10*3/uL (ref 0.0–0.7)
Eosinophils Relative: 2 %
HCT: 36.4 % — ABNORMAL LOW (ref 39.0–52.0)
Hemoglobin: 11.6 g/dL — ABNORMAL LOW (ref 13.0–17.0)
Lymphocytes Relative: 33 %
Lymphs Abs: 2.9 10*3/uL (ref 0.7–4.0)
MCH: 30.1 pg (ref 26.0–34.0)
MCHC: 31.9 g/dL (ref 30.0–36.0)
MCV: 94.3 fL (ref 78.0–100.0)
Monocytes Absolute: 1 10*3/uL (ref 0.1–1.0)
Monocytes Relative: 12 %
Neutro Abs: 4.6 10*3/uL (ref 1.7–7.7)
Neutrophils Relative %: 53 %
Platelets: 248 10*3/uL (ref 150–400)
RBC: 3.86 MIL/uL — ABNORMAL LOW (ref 4.22–5.81)
RDW: 14.9 % (ref 11.5–15.5)
WBC: 8.7 10*3/uL (ref 4.0–10.5)

## 2016-08-26 LAB — FERRITIN: Ferritin: 22 ng/mL — ABNORMAL LOW (ref 24–336)

## 2016-08-26 MED ORDER — OXALIPLATIN CHEMO INJECTION 100 MG/20ML
85.0000 mg/m2 | Freq: Once | INTRAVENOUS | Status: AC
  Administered 2016-08-26: 190 mg via INTRAVENOUS
  Filled 2016-08-26: qty 38

## 2016-08-26 MED ORDER — FLUOROURACIL CHEMO INJECTION 2.5 GM/50ML
400.0000 mg/m2 | Freq: Once | INTRAVENOUS | Status: AC
  Administered 2016-08-26: 900 mg via INTRAVENOUS
  Filled 2016-08-26: qty 18

## 2016-08-26 MED ORDER — DEXAMETHASONE SODIUM PHOSPHATE 10 MG/ML IJ SOLN
INTRAMUSCULAR | Status: AC
  Filled 2016-08-26: qty 1

## 2016-08-26 MED ORDER — LEUCOVORIN CALCIUM INJECTION 350 MG
405.0000 mg/m2 | Freq: Once | INTRAVENOUS | Status: AC
  Administered 2016-08-26: 900 mg via INTRAVENOUS
  Filled 2016-08-26: qty 35

## 2016-08-26 MED ORDER — PALONOSETRON HCL INJECTION 0.25 MG/5ML
0.2500 mg | Freq: Once | INTRAVENOUS | Status: AC
  Administered 2016-08-26: 0.25 mg via INTRAVENOUS
  Filled 2016-08-26: qty 5

## 2016-08-26 MED ORDER — SODIUM CHLORIDE 0.9 % IV SOLN
2400.0000 mg/m2 | INTRAVENOUS | Status: DC
  Administered 2016-08-26: 5350 mg via INTRAVENOUS
  Filled 2016-08-26: qty 71

## 2016-08-26 MED ORDER — DEXTROSE 5 % IV SOLN
Freq: Once | INTRAVENOUS | Status: AC
  Administered 2016-08-26: 10:00:00 via INTRAVENOUS

## 2016-08-26 MED ORDER — DEXAMETHASONE SODIUM PHOSPHATE 10 MG/ML IJ SOLN
10.0000 mg | Freq: Once | INTRAMUSCULAR | Status: AC
  Administered 2016-08-26: 10 mg via INTRAVENOUS

## 2016-08-26 MED ORDER — SODIUM CHLORIDE 0.9% FLUSH
10.0000 mL | INTRAVENOUS | Status: DC | PRN
  Administered 2016-08-26: 10 mL
  Filled 2016-08-26: qty 10

## 2016-08-26 NOTE — Progress Notes (Signed)
Chemotherapy teaching completed.  Consent signed.  Extensive teaching packet given.   

## 2016-08-26 NOTE — Progress Notes (Signed)
[place] NOTE  Patient Care Team: Tobe Sos, MD as PCP - General (Internal Medicine)  CHIEF COMPLAINTS/PURPOSE OF CONSULTATION:  Adenocarcinoma of the colon, Stage IIIB   HISTORY OF PRESENTING ILLNESS:  Kevin Kirk 83 y.o. male is here because of referral by Dr. Aviva Signs. The patient initially noticed bright red blood in his stool in December 2017.   The patient underwent colonoscopy on 06/05/16 with Dr. Laural Golden which showed a fungating, polypoid and ulcerated non-obstructing large mass was found in the mid transverse colon. The mass was partially circumferential (involving two-thirds of the lumen circumference). The mass measured three cm in length. No bleeding was present. Subsequent biopsy on 06/05/16 revealed adenocarcinoma.  06/20/16: CT abd/pelvis with contrast: Mild focal wall thickening along the proximal/mid transverse colon in the right mid abdomen, possibly corresponding to the abnormality on colonoscopy, equivocal. No findings suspicious for metastatic disease in the abdomen/pelvis.  07/11/16: Partial colectomy by Dr. Arnoldo Morale. This confirmed adenocarcinoma of the colon, grade 2, spanning 3.8 cm. The tumor invaded through the muscularis propria into the pericolonic soft tissues. Additional lymphovascular invasion was identified. Margins of resection were negative.  Of 16 lymph nodes sampled, one was found positive for metastatic disease. All other lymph nodes negative.  08/04/16: CTA chest: No demonstrable pulmonary embolus. Ascending thoracic aortic diameter is 4.6 x 4.4 cm. No dissection evident. Ascending thoracic aortic aneurysm. Granuloma right base, calcified. Bibasilar atelectasis. Mild calcification along the posterior right apical pleural region without pleural thickening or mass associated. No adenopathy. Scattered foci of aortic and coronary artery calcification. Status post aortic valve replacement.  Baseline CEA: 2.6  Patient  presents for follow up today. He is doing well. He states his energy level has improved sine his last visit. He has no complaints today. He will start cycle 1 of FOLFOX today. He received chemo education this am with our nurse navigator.     MEDICAL HISTORY:  Past Medical History:  Diagnosis Date  . Chronic pulmonary embolism ([place]) 77/9390   compliction of the valve replacement surgery  . COPD (chronic obstructive pulmonary disease) ([place])   . Diabetes ([place]) 04/22/2016  . H/O aortic valve replacement 10/2008  . Hypercholesteremia   . Hypertension   . Pulmonary emboli ([place]) 10/2008  . Sleep apnea     SURGICAL HISTORY: Past Surgical History:  Procedure Laterality Date  . AORTIC VALVE REPLACEMENT     2010  . CARDIAC CATHETERIZATION  2010  . COLONOSCOPY N/A 06/05/2016   Procedure: COLONOSCOPY;  Surgeon: Rogene Houston, MD;  Location: [facility];  Service: Endoscopy;  Laterality: N/A;  . PARTIAL COLECTOMY N/A 07/11/2016   Procedure: PARTIAL COLECTOMY;  Surgeon: Aviva Signs, MD;  Location: [facility] ORS;  Service: General;  Laterality: N/A;  . PORTACATH PLACEMENT N/A 08/13/2016   Procedure: INSERTION PORT-A-CATH LEFT SUBCLAVIAN;  Surgeon: Aviva Signs, MD;  Location: [facility] ORS;  Service: General;  Laterality: N/A;  . REPLACEMENT TOTAL KNEE     1996  . spenectomy     from trauma    SOCIAL HISTORY: Social History   Social History  . Marital status: Widowed    Spouse name: N/A  . Number of children: N/A  . Years of education: N/A   Occupational History  . Not on file.   Social History Main Topics  . Smoking status: Former Smoker    Years: 15.00    Types: Cigarettes    Quit date: 1964  . Smokeless tobacco:  Never Used  . Alcohol use No  . Drug use: No  . Sexual activity: Yes   Other Topics Concern  . Not on file   Social History Narrative  . No narrative on file  The patient reports his wife passed in September 2017, and he now lives alone. The patient has a granddaughter  who lives near him and is available to assist him as needed. The patient is a former smoker.  FAMILY HISTORY: Family History  Problem Relation Age of Onset  . Colon cancer Neg Hx     ALLERGIES:  is allergic to amoxicillin and tamsulosin hcl.  MEDICATIONS:  Current Outpatient Prescriptions  Medication Sig Dispense Refill  . acetaminophen (TYLENOL) 500 MG tablet Take 500 mg by mouth daily as needed for moderate pain or headache.    Marland Kitchen aspirin EC 81 MG tablet Take 81 mg by mouth daily.    Marland Kitchen atorvastatin (LIPITOR) 10 MG tablet Take 10 mg by mouth at bedtime.     . Cholecalciferol (VITAMIN D3) 5000 units CAPS Take 5,000 Units by mouth daily.     . ferrous sulfate 325 (65 FE) MG tablet Take 325 mg by mouth daily with breakfast.    . finasteride (PROSCAR) 5 MG tablet Take 5 mg by mouth at bedtime.     . fluorouracil CALGB 16109 in sodium chloride 0.9 % 150 mL Inject into the vein over 96 hr. Every 2 weeks, infusion pump for 46 hours    . Glucosamine HCl 1000 MG TABS Take 1,000 mg by mouth 2 (two) times daily.    Marland Kitchen leucovorin in dextrose 5 % 250 mL Inject into the vein once. Every 2 weeks    . lidocaine-prilocaine (EMLA) cream Apply to affected area once 30 g 3  . lisinopril (PRINIVIL,ZESTRIL) 5 MG tablet Take 5 mg by mouth at bedtime.     Marland Kitchen MAGNESIUM PO Take 265 mg by mouth daily.    . metFORMIN (GLUCOPHAGE) 500 MG tablet Take 500 mg by mouth 2 (two) times daily.    . metoprolol (LOPRESSOR) 50 MG tablet Take 50 mg by mouth 2 (two) times daily.    . ondansetron (ZOFRAN) 8 MG tablet Take 1 tablet (8 mg total) by mouth 2 (two) times daily as needed for refractory nausea / vomiting. Start on day 3 after chemotherapy. 30 tablet 1  . OXALIPLATIN IV Inject into the vein. Every 2 weeks    . prochlorperazine (COMPAZINE) 10 MG tablet Take 1 tablet (10 mg total) by mouth every 6 (six) hours as needed (Nausea or vomiting). 30 tablet 1  . vitamin B-12 (CYANOCOBALAMIN) 1000 MCG tablet Take 1,000 mcg by  mouth daily.      No current facility-administered medications for this visit.    Facility-Administered Medications Ordered in Other Visits  Medication Dose Route Frequency Provider Last Rate Last Dose  . fluorouracil (ADRUCIL) 5,350 mg in sodium chloride 0.9 % 143 mL chemo infusion  2,400 mg/m2 (Treatment Plan Recorded) Intravenous 1 day or 1 dose Twana First, MD      . fluorouracil (ADRUCIL) chemo injection 900 mg  400 mg/m2 (Treatment Plan Recorded) Intravenous Once Twana First, MD      . leucovorin 900 mg in dextrose 5 % 250 mL infusion  405 mg/m2 (Treatment Plan Recorded) Intravenous Once Twana First, MD 148 mL/hr at 08/26/16 1048 900 mg at 08/26/16 1048  . oxaliplatin (ELOXATIN) 190 mg in dextrose 5 % 500 mL chemo infusion  85 mg/m2 (Treatment Plan Recorded)  Intravenous Once Twana First, MD 269 mL/hr at 08/26/16 1049 190 mg at 08/26/16 1049  . sodium chloride flush (NS) 0.9 % injection 10 mL  10 mL Intracatheter PRN Twana First, MD   10 mL at 08/26/16 3299    Review of Systems  Constitutional: Negative.  Negative for chills and fever.  HENT: Negative.  Negative for hearing loss, sore throat and tinnitus.   Eyes: Negative.  Negative for blurred vision, photophobia and discharge.  Respiratory: Negative.  Negative for cough, hemoptysis, shortness of breath and wheezing.   Cardiovascular: Negative.  Negative for chest pain, palpitations, orthopnea, claudication and leg swelling.  Gastrointestinal: Negative for abdominal pain, constipation, diarrhea, melena, nausea and vomiting.  Genitourinary: Negative.  Negative for dysuria and hematuria.  Musculoskeletal: Negative.  Negative for back pain, joint pain and myalgias.  Skin: Negative.  Negative for itching and rash.  Neurological: Negative.  Negative for dizziness, weakness and headaches.  Endo/Heme/Allergies: Negative.  Negative for environmental allergies and polydipsia. Does not bruise/bleed easily.  Psychiatric/Behavioral: Negative.   Negative for depression. The patient is not nervous/anxious and does not have insomnia.   All other systems reviewed and are negative.  14 point ROS was done and is otherwise as detailed above or in HPI   PHYSICAL EXAMINATION: ECOG PERFORMANCE STATUS: 0 - Asymptomatic  Vitals:   08/26/16 0844  BP: 135/81  Pulse: 71  Resp: 18  Temp: 97.8 F (36.6 C)   Filed Weights   08/26/16 0844  Weight: 208 lb (94.3 kg)     Physical Exam  Constitutional: He is oriented to person, place, and time and well-developed, well-nourished, and in no distress. No distress.  HENT:  Head: Normocephalic and atraumatic.  Mouth/Throat: No oropharyngeal exudate.  Eyes: Conjunctivae and EOM are normal. Pupils are equal, round, and reactive to light. No scleral icterus.  Neck: Normal range of motion. Neck supple. No JVD present.  Cardiovascular: Normal rate, regular rhythm and normal heart sounds.  Exam reveals no gallop and no friction rub.   No murmur heard. Pulmonary/Chest: Effort normal and breath sounds normal. No respiratory distress. He has no wheezes. He has no rales. He exhibits no tenderness.  Abdominal: Soft. Bowel sounds are normal. He exhibits no distension and no mass. There is no tenderness. There is no rebound and no guarding.  Musculoskeletal: Normal range of motion. He exhibits no edema or tenderness.  Lymphadenopathy:    He has no cervical adenopathy.  Neurological: He is alert and oriented to person, place, and time. No cranial nerve deficit. Gait normal.  Skin: Skin is warm and dry. No rash noted. No erythema. No pallor.  Psychiatric: Affect and judgment normal.  Nursing note and vitals reviewed.     LABORATORY DATA:  I have reviewed the data as listed Lab Results  Component Value Date   WBC 8.7 08/26/2016   HGB 11.6 (L) 08/26/2016   HCT 36.4 (L) 08/26/2016   MCV 94.3 08/26/2016   PLT 248 08/26/2016   CMP     Component Value Date/Time   NA 138 08/26/2016 0831   K 3.7  08/26/2016 0831   CL 104 08/26/2016 0831   CO2 26 08/26/2016 0831   GLUCOSE 136 (H) 08/26/2016 0831   BUN 14 08/26/2016 0831   CREATININE 0.84 08/26/2016 0831   CALCIUM 9.0 08/26/2016 0831   PROT 7.4 08/26/2016 0831   ALBUMIN 3.8 08/26/2016 0831   AST 23 08/26/2016 0831   ALT 17 08/26/2016 0831   ALKPHOS 38 08/26/2016  0831   BILITOT 0.6 08/26/2016 0831   GFRNONAA >60 08/26/2016 0831   GFRAA >60 08/26/2016 0831   PATHOLOGY  Diagnosis 07/11/16 Colon, segmental resection for tumor, right ADENOCARCINOMA OF THE COLON, GRADE 2 (3.8 CM) THE TUMOR INVADES THROUGH THE MUSCULARIS PROPRIA INTO PERICOLONIC SOFT TISSUE (PT3) LYMPHOVASCULAR INVASION IS IDENTIFIED MARGINS OF RESECTION ARE NEGATIVE FOR CARCINOMA METASTATIC ADENOCARCINOMA IN ONE OF SIXTEEN LYMPH NODE (1/16) ONE EXTRAMURAL SATELLITE TUMOR NODULE UNREMARKABLE APPENDIX BENIGN ECTOPIC SPLEEN TISSUE Microscopic Comment COLON AND RECTUM (INCLUDING TRANS-ANAL RESECTION): Specimen: Right colon Procedure: Segmental resection Tumor site: distal right colon Specimen integrity: Intact Macroscopic intactness of mesorectum: Not applicable: x Complete: NA Near complete: NA Incomplete: NA Cannot be determined (specify): NA Macroscopic tumor perforation: Into subserosal adipose tissue Invasive tumor: Maximum size: 3.8 cm Histologic type(s): Adenocarcinoma Histologic grade and differentiation: G1: well differentiated/low grade G2: moderately differentiated/low grade 1 of 5 Supplemental copy SUPPLEMENTAL for MONTRELLE, EDDINGS ([id number]) Microscopic Comment(continued) G3: poorly differentiated/high grade G4: undifferentiated/high grade Type of polyp in which invasive carcinoma arose: Tubular adenoma Microscopic extension of invasive tumor: Into peri colonic soft tissue Lymph-Vascular invasion: Present Peri-neural invasion: Negative Tumor deposit(s) (discontinuous extramural extension): present Resection margins: Proximal margin:  Negative Distal margin: Negative Circumferential (radial) (posterior ascending, posterior descending; lateral and posterior mid-rectum; and entire lower 1/3 rectum): 2.1 cm Mesenteric margin (sigmoid and transverse): negative Distance closest margin (if all above margins negative): 1.5 cm Trans-anal resection margins only: Deep margin: NA Mucosal Margin: NA Distance closest mucosal margin (if negative): NA Treatment effect (neo-adjuvant therapy): Negative Additional polyp(s): Negative Non-neoplastic findings: Ectopic spleen tissue Lymph nodes: number examined 16; number positive: 1 Pathologic Staging: pT3, pN1, pMx Ancillary studies: ordered  Diagnosis 06/05/16 Colon, polyp(s), ulcerative mass - ADENOCARCINOMA.   RADIOGRAPHIC STUDIES: I have personally reviewed the radiological images as listed and agreed with the findings in the report. No results found.  ASSESSMENT & PLAN:  Cancer Staging 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[place]) Staging form: Colon and Rectum, AJCC 8th Edition - Clinical: Stage IIIB (cT3, cN1a, cM0) - Signed by Twana First, MD on 08/05/2016  No problem-specific Assessment & Plan notes found for this encounter. Adenocarcinoma of the colon, Stage IIIB Mild normocytic anemia likely due to blood loss from colon cancer  PLAN: Plan for adjuvant chemotherapy with FOLFOX q2 weeks for 12 cycles, cycle 1 today. Labs reviewed.  Return in 7 days for nadir check and assessment for tolerance to chemo.   All questions were answered. The patient knows to call the clinic with any problems, questions or concerns.   Twana First, MD  08/26/2016 12:01 PM

## 2016-08-26 NOTE — Patient Instructions (Signed)
[facility] at [facility] Discharge Instructions  RECOMMENDATIONS MADE BY THE CONSULTANT AND ANY TEST RESULTS WILL BE SENT TO YOUR REFERRING PHYSICIAN.  You were seen today by Dr. Zhou.   Thank you for choosing  Cancer Center at [facility] to provide your oncology and hematology care.  To afford each patient quality time with our provider, please arrive at least 15 minutes before your scheduled appointment time.    If you have a lab appointment with the Cancer Center please come in thru the  Main Entrance and check in at the main information desk  You need to re-schedule your appointment should you arrive 10 or more minutes late.  We strive to give you quality time with our providers, and arriving late affects you and other patients whose appointments are after yours.  Also, if you no show three or more times for appointments you may be dismissed from the clinic at the providers discretion.     Again, thank you for choosing [facility].  Our hope is that these requests will decrease the amount of time that you wait before being seen by our physicians.       _____________________________________________________________  Should you have questions after your visit to [facility], please contact our office at [phone] between the hours of 8:30 a.m. and 4:30 p.m.  Voicemails left after 4:30 p.m. will not be returned until the following business day.  For prescription refill requests, have your pharmacy contact our office.       Resources For Cancer Patients and their Caregivers ? American Cancer Society: Can assist with transportation, wigs, general needs, runs Look Good Feel Better.        1-888-227-6333 ? Cancer Care: Provides financial assistance, online support groups, medication/co-pay assistance.  1-800-813-HOPE (4673) ? [facility] Assists [place] cancer patients and their families  through emotional , educational and financial support.  336-427-4357 ? [place] DSS Where to apply for food stamps, Medicaid and utility assistance. 336-342-1394 ? RCATS: Transportation to medical appointments. 336-347-2287 ? Social Security Administration: May apply for disability if have a Stage IV cancer. 336-342-7796 1-800-772-1213 ? [place] Aging, Disability and Transit Services: Assists with nutrition, care and transit needs. 336-349-2343  Cancer Center Support Programs: @10RELATIVEDAYS@ > Cancer Support Group  2nd Tuesday of the month 1pm-2pm, Journey Room  > Creative Journey  3rd Tuesday of the month 1130am-1pm, Journey Room  > Look Good Feel Better  1st Wednesday of the month 10am-12 noon, Journey Room (Call American Cancer Society to register 1-800-395-5775)    

## 2016-08-26 NOTE — Patient Instructions (Signed)
[facility] Discharge Instructions for Patients Receiving Chemotherapy   Beginning January 23rd 2017 lab work for the Cancer Center will be done in the  Main lab at [facility] on 1st floor. If you have a lab appointment with the Cancer Center please come in thru the  Main Entrance and check in at the main information desk   Today you received the following chemotherapy agents   To help prevent nausea and vomiting after your treatment, we encourage you to take your nausea medication     If you develop nausea and vomiting, or diarrhea that is not controlled by your medication, call the clinic.  The clinic phone number is [phone]. Office hours are Monday-Friday 8:30am-5:00pm.  BELOW ARE SYMPTOMS THAT SHOULD BE REPORTED IMMEDIATELY:  *FEVER GREATER THAN 101.0 F  *CHILLS WITH OR WITHOUT FEVER  NAUSEA AND VOMITING THAT IS NOT CONTROLLED WITH YOUR NAUSEA MEDICATION  *UNUSUAL SHORTNESS OF BREATH  *UNUSUAL BRUISING OR BLEEDING  TENDERNESS IN MOUTH AND THROAT WITH OR WITHOUT PRESENCE OF ULCERS  *URINARY PROBLEMS  *BOWEL PROBLEMS  UNUSUAL RASH Items with * indicate a potential emergency and should be followed up as soon as possible. If you have an emergency after office hours please contact your primary care physician or go to the nearest emergency department.  Please call the clinic during office hours if you have any questions or concerns.   You may also contact the Patient Navigator at [phone] should you have any questions or need assistance in obtaining follow up care.      Resources For Cancer Patients and their Caregivers ? American Cancer Society: Can assist with transportation, wigs, general needs, runs Look Good Feel Better.        1-888-227-6333 ? Cancer Care: Provides financial assistance, online support groups, medication/co-pay assistance.  1-800-813-HOPE (4673) ? [facility] Assists [place] cancer  patients and their families through emotional , educational and financial support.  336-427-4357 ? [place] DSS Where to apply for food stamps, Medicaid and utility assistance. 336-342-1394 ? RCATS: Transportation to medical appointments. 336-347-2287 ? Social Security Administration: May apply for disability if have a Stage IV cancer. 336-342-7796 1-800-772-1213 ? [place] Aging, Disability and Transit Services: Assists with nutrition, care and transit needs. 336-349-2343         

## 2016-08-26 NOTE — Progress Notes (Signed)
[facility] Clinical Social Work  Clinical Social Work was referred by patient navigator for assessment of psychosocial needs due to new patient starting treatment for colon cancer. Clinical Social Worker met with patient and his wife at [facility] during first treatment to offer support and assess for needs.  CSW introduced self, explained role of CSW/Pt and Family Support Team, support groups and other resources to assist. Pt and wife were provided with CSW handout with description of CSW role and contact information to use as needed. Pt and wife denied current needs, but agree to reach out as needed.      Clinical Social Work interventions: Resource education  Loren Racer, LCSW, OSW-C [place] [facility]   [phone]

## 2016-08-26 NOTE — Progress Notes (Signed)
Chemotherapy given today per orders. Patient tolerated it well, without issues.vitals stables and discharged home from clinic ambulatory.Continuous 5FU pump connected today. Consent signed for this and education done. Spill kit given to patient as well. Chemotherapy should end somewhere around 1130 on Thursday. Patient instructed on how to stop pump and come on in.

## 2016-08-27 ENCOUNTER — Telehealth (HOSPITAL_COMMUNITY): Payer: Self-pay

## 2016-08-27 NOTE — Telephone Encounter (Signed)
Called to check on patient today. He states he is doing great, no issues at this time. Continous 5FU infusion pump is doing fine per patient. Patient understands to come in tomorrow as scheduled to have pump removed.

## 2016-08-28 ENCOUNTER — Encounter (HOSPITAL_BASED_OUTPATIENT_CLINIC_OR_DEPARTMENT_OTHER): Payer: Medicare Other

## 2016-08-28 VITALS — BP 144/71 | HR 60 | Temp 98.0°F | Resp 18 | Wt 213.4 lb

## 2016-08-28 DIAGNOSIS — C184 Malignant neoplasm of transverse colon: Secondary | ICD-10-CM | POA: Diagnosis present

## 2016-08-28 MED ORDER — HEPARIN SOD (PORK) LOCK FLUSH 100 UNIT/ML IV SOLN
500.0000 [IU] | Freq: Once | INTRAVENOUS | Status: AC | PRN
  Administered 2016-08-28: 500 [IU]

## 2016-08-28 MED ORDER — SODIUM CHLORIDE 0.9% FLUSH
10.0000 mL | INTRAVENOUS | Status: DC | PRN
  Administered 2016-08-28: 10 mL
  Filled 2016-08-28: qty 10

## 2016-08-28 MED ORDER — HEPARIN SOD (PORK) LOCK FLUSH 100 UNIT/ML IV SOLN
INTRAVENOUS | Status: AC
  Filled 2016-08-28: qty 5

## 2016-08-28 NOTE — Progress Notes (Signed)
Upon arrival patient's pump still infusing, 2.57ml remaining. Stopped pump, primed remainder of infusion. Disconnected pump from patient. Flushed port per protocol, needle removed. Patient denies any complaints with infusion. Denies any issues with pump. Patient discharged stable and ambulatory home to self.

## 2016-08-28 NOTE — Patient Instructions (Signed)
[place] at [facility] Discharge Instructions  RECOMMENDATIONS MADE BY THE CONSULTANT AND ANY TEST RESULTS WILL BE SENT TO YOUR REFERRING PHYSICIAN.  You were disconnected from your pump today and port a cath access was removed.  Follow up as previously scheduled.  Thank you for choosing [place] at [facility] to provide your oncology and hematology care.  To afford each patient quality time with our provider, please arrive at least 15 minutes before your scheduled appointment time.    If you have a lab appointment with the [facility] please come in thru the  Main Entrance and check in at the main information desk  You need to re-schedule your appointment should you arrive 10 or more minutes late.  We strive to give you quality time with our providers, and arriving late affects you and other patients whose appointments are after yours.  Also, if you no show three or more times for appointments you may be dismissed from the clinic at the providers discretion.     Again, thank you for choosing [facility].  Our hope is that these requests will decrease the amount of time that you wait before being seen by our physicians.       _____________________________________________________________  Should you have questions after your visit to [facility], please contact our office at [phone] between the hours of 8:30 a.m. and 4:30 p.m.  Voicemails left after 4:30 p.m. will not be returned until the following business day.  For prescription refill requests, have your pharmacy contact our office.       Resources For Cancer Patients and their Caregivers ? American Cancer Society: Can assist with transportation, wigs, general needs, runs Look Good Feel Better.        816-601-4364 ? Cancer Care: Provides financial assistance, online support groups, medication/co-pay assistance.  1-800-813-HOPE 303-035-3506) ? [facility] Assists [place] cancer patients and their families through emotional , educational and financial support.  304-159-5571 ? [place] DSS Where to apply for food stamps, Medicaid and utility assistance. 772-257-5970 ? RCATS: Transportation to medical appointments. (336)092-4874 ? Social Security Administration: May apply for disability if have a Stage IV cancer. 803-599-5818 (351)184-1468 ? [employer], Disability and Transit Services: Assists with nutrition, care and transit needs. [place] Support Programs: @10RELATIVEDAYS @ > Cancer Support Group  2nd Tuesday of the month 1pm-2pm, Journey Room  > Creative Journey  3rd Tuesday of the month 1130am-1pm, Journey Room  > Look Good Feel Better  1st Wednesday of the month 10am-12 noon, Journey Room (Call [place] to register [phone])

## 2016-09-01 ENCOUNTER — Telehealth (HOSPITAL_COMMUNITY): Payer: Self-pay

## 2016-09-01 NOTE — Telephone Encounter (Signed)
24 hour follow up -patient did have some diarrhea this weekend but it resolved with medication. No other complaints. Patient tolerated chemo well otherwise.

## 2016-09-02 ENCOUNTER — Encounter (HOSPITAL_COMMUNITY): Payer: Self-pay | Admitting: Oncology

## 2016-09-02 ENCOUNTER — Encounter (HOSPITAL_BASED_OUTPATIENT_CLINIC_OR_DEPARTMENT_OTHER): Payer: Medicare Other | Admitting: Adult Health

## 2016-09-02 ENCOUNTER — Encounter (HOSPITAL_COMMUNITY): Payer: Medicare Other

## 2016-09-02 ENCOUNTER — Encounter (HOSPITAL_COMMUNITY): Payer: Self-pay | Admitting: Adult Health

## 2016-09-02 VITALS — BP 129/66 | HR 72 | Temp 98.0°F | Resp 16 | Wt 205.8 lb

## 2016-09-02 DIAGNOSIS — C184 Malignant neoplasm of transverse colon: Secondary | ICD-10-CM | POA: Diagnosis present

## 2016-09-02 DIAGNOSIS — D509 Iron deficiency anemia, unspecified: Secondary | ICD-10-CM | POA: Diagnosis not present

## 2016-09-02 LAB — CBC WITH DIFFERENTIAL/PLATELET
Basophils Absolute: 0 10*3/uL (ref 0.0–0.1)
Basophils Relative: 0 %
Eosinophils Absolute: 0.1 10*3/uL (ref 0.0–0.7)
Eosinophils Relative: 2 %
HCT: 36.1 % — ABNORMAL LOW (ref 39.0–52.0)
Hemoglobin: 11.8 g/dL — ABNORMAL LOW (ref 13.0–17.0)
Lymphocytes Relative: 38 %
Lymphs Abs: 3 10*3/uL (ref 0.7–4.0)
MCH: 30.5 pg (ref 26.0–34.0)
MCHC: 32.7 g/dL (ref 30.0–36.0)
MCV: 93.3 fL (ref 78.0–100.0)
Monocytes Absolute: 0.8 10*3/uL (ref 0.1–1.0)
Monocytes Relative: 10 %
Neutro Abs: 3.9 10*3/uL (ref 1.7–7.7)
Neutrophils Relative %: 50 %
Platelets: 164 10*3/uL (ref 150–400)
RBC: 3.87 MIL/uL — ABNORMAL LOW (ref 4.22–5.81)
RDW: 14.3 % (ref 11.5–15.5)
WBC: 7.8 10*3/uL (ref 4.0–10.5)

## 2016-09-02 LAB — COMPREHENSIVE METABOLIC PANEL
ALT: 15 U/L — ABNORMAL LOW (ref 17–63)
AST: 22 U/L (ref 15–41)
Albumin: 3.8 g/dL (ref 3.5–5.0)
Alkaline Phosphatase: 40 U/L (ref 38–126)
Anion gap: 8 (ref 5–15)
BUN: 18 mg/dL (ref 6–20)
CO2: 27 mmol/L (ref 22–32)
Calcium: 8.9 mg/dL (ref 8.9–10.3)
Chloride: 102 mmol/L (ref 101–111)
Creatinine, Ser: 0.86 mg/dL (ref 0.61–1.24)
GFR calc Af Amer: >60 mL/min (ref >60)
GFR calc non Af Amer: >60 mL/min (ref >60)
Glucose, Bld: 164 mg/dL — ABNORMAL HIGH (ref 65–99)
Potassium: 4.5 mmol/L (ref 3.5–5.1)
Sodium: 137 mmol/L (ref 135–145)
Total Bilirubin: 0.5 mg/dL (ref 0.3–1.2)
Total Protein: 7.3 g/dL (ref 6.5–8.1)

## 2016-09-02 NOTE — Progress Notes (Unsigned)
Faxed referral and patient records to [facility]. °

## 2016-09-02 NOTE — Progress Notes (Signed)
[facility] [address] [place] [address]   CLINIC:  Medical Oncology/Hematology  PCP:  Tobe Sos, MD [address] [place] [address] [phone]   REASON FOR VISIT:  Follow-up for Stage IIIB adenocarcinoma of colon  CURRENT THERAPY: FOLFOX chemo beginning 08/26/16.    HISTORY OF PRESENT ILLNESS:  (From Dr. Laverle Patter last note on 08/26/16)      INTERVAL HISTORY:  Kevin Kirk 83 y.o. male returns for routine follow-up for Stage IIIB colon cancer.   Overall, he tells me that he feels "pretty good."  Appetite and energy levels are both 75%.  Denies fever/chills. He had some diarrhea after his first cycle of FOLFOX; he manages the diarrhea with flour and water.  He has not required any doses of Imodium.  Denies any blood in his stools since his past hospitalization. Denies dark/tarry stools.   States that he has trouble chewing, which he relates to dental issues. Denies mouth sores; states that his right upper lip was sore/tender to touch, but no ulcerations.   He has baseline peripheral neuropathy to his feet from diabetes; states that numbness/tingling is not worse than before chemo.    Otherwise, he is largely without complaints today.     REVIEW OF SYSTEMS:  Review of Systems  Constitutional: Positive for fatigue.  HENT:         -Difficulty chewing -Lip tenderness; no mouth sores  Eyes: Negative.   Respiratory: Negative.   Cardiovascular: Negative.   Gastrointestinal: Positive for diarrhea.  Genitourinary: Negative.    Musculoskeletal: Negative.   Skin: Negative.   Neurological: Positive for numbness.  Hematological: Negative.   Psychiatric/Behavioral: Negative.      PAST MEDICAL/SURGICAL HISTORY:  Past Medical History:  Diagnosis Date  . Chronic pulmonary embolism ([place]) 95/1884   compliction of the valve replacement surgery  . COPD (chronic obstructive pulmonary disease) ([place])   . Diabetes ([place]) 04/22/2016  . H/O aortic valve  replacement 10/2008  . Hypercholesteremia   . Hypertension   . Pulmonary emboli ([place]) 10/2008  . Sleep apnea    Past Surgical History:  Procedure Laterality Date  . AORTIC VALVE REPLACEMENT     2010  . CARDIAC CATHETERIZATION  2010  . COLONOSCOPY N/A 06/05/2016   Procedure: COLONOSCOPY;  Surgeon: Rogene Houston, MD;  Location: [facility];  Service: Endoscopy;  Laterality: N/A;  . PARTIAL COLECTOMY N/A 07/11/2016   Procedure: PARTIAL COLECTOMY;  Surgeon: Aviva Signs, MD;  Location: [facility] ORS;  Service: General;  Laterality: N/A;  . PORTACATH PLACEMENT N/A 08/13/2016   Procedure: INSERTION PORT-A-CATH LEFT SUBCLAVIAN;  Surgeon: Aviva Signs, MD;  Location: [facility] ORS;  Service: General;  Laterality: N/A;  . REPLACEMENT TOTAL KNEE     1996  . spenectomy     from trauma     SOCIAL HISTORY:  Social History   Social History  . Marital status: Widowed    Spouse name: N/A  . Number of children: N/A  . Years of education: N/A   Occupational History  . Not on file.   Social History Main Topics  . Smoking status: Former Smoker    Years: 15.00    Types: Cigarettes    Quit date: 1964  . Smokeless tobacco: Never Used  . Alcohol use No  . Drug use: No  . Sexual activity: Yes   Other Topics Concern  . Not on file   Social History Narrative  . No narrative on file  FAMILY HISTORY:  Family History  Problem Relation Age of Onset  . Colon cancer Neg Hx     CURRENT MEDICATIONS:  Outpatient Encounter Prescriptions as of 09/02/2016  Medication Sig Note  . acetaminophen (TYLENOL) 500 MG tablet Take 500 mg by mouth daily as needed for moderate pain or headache. 07/03/2016: Seldom   . aspirin EC 81 MG tablet Take 81 mg by mouth daily.   Marland Kitchen atorvastatin (LIPITOR) 10 MG tablet Take 10 mg by mouth at bedtime.    . Cholecalciferol (VITAMIN D3) 5000 units CAPS Take 5,000 Units by mouth daily.    . ferrous sulfate 325 (65 FE) MG tablet Take 325 mg by mouth daily with breakfast.   .  finasteride (PROSCAR) 5 MG tablet Take 5 mg by mouth at bedtime.    . fluorouracil CALGB 66063 in sodium chloride 0.9 % 150 mL Inject into the vein over 96 hr. Every 2 weeks, infusion pump for 46 hours   . leucovorin in dextrose 5 % 250 mL Inject into the vein once. Every 2 weeks   . lidocaine-prilocaine (EMLA) cream Apply to affected area once 08/05/2016: New Med   . lisinopril (PRINIVIL,ZESTRIL) 5 MG tablet Take 5 mg by mouth at bedtime.    Marland Kitchen MAGNESIUM PO Take 265 mg by mouth daily.   . metFORMIN (GLUCOPHAGE) 500 MG tablet Take 500 mg by mouth 2 (two) times daily.   . metoprolol (LOPRESSOR) 50 MG tablet Take 50 mg by mouth 2 (two) times daily.   . ondansetron (ZOFRAN) 8 MG tablet Take 1 tablet (8 mg total) by mouth 2 (two) times daily as needed for refractory nausea / vomiting. Start on day 3 after chemotherapy. 08/05/2016: New Med  . OXALIPLATIN IV Inject into the vein. Every 2 weeks   . prochlorperazine (COMPAZINE) 10 MG tablet Take 1 tablet (10 mg total) by mouth every 6 (six) hours as needed (Nausea or vomiting). 08/05/2016: New Med  . vitamin B-12 (CYANOCOBALAMIN) 1000 MCG tablet Take 1,000 mcg by mouth daily.     No facility-administered encounter medications on file as of 09/02/2016.     ALLERGIES:  Allergies  Allergen Reactions  . Amoxicillin Hives    Has patient had a PCN reaction causing immediate rash, facial/tongue/throat swelling, SOB or lightheadedness with hypotension: No Has patient had a PCN reaction causing severe rash involving mucus membranes or skin necrosis: Yes Has patient had a PCN reaction that required hospitalization No Has patient had a PCN reaction occurring within the last 10 years: No If all of the above answers are "NO", then may proceed with Cephalosporin use.   . Tamsulosin Hcl     Cant sleep, confusion      PHYSICAL EXAM:  ECOG Performance status: 1 - Symptomatic; largely independent.   Vitals:   09/02/16 1029  BP: 129/66  Pulse: 72  Resp: 16    Temp: 98 F (36.7 C)   Filed Weights   09/02/16 1029  Weight: 205 lb 12.8 oz (93.4 kg)    Physical Exam  Constitutional: He is oriented to person, place, and time and well-developed, well-nourished, and in no distress.  HENT:  Head: Normocephalic.  Mouth/Throat: Oropharynx is clear and moist. No oropharyngeal exudate.  (R) upper lip tenderness; no ulcerations or lesions.   Eyes: Conjunctivae are normal. Pupils are equal, round, and reactive to light. No scleral icterus.  Neck: Normal range of motion. Neck supple.  Cardiovascular: Normal rate, regular rhythm and normal heart sounds.   Pulmonary/Chest: Effort  normal and breath sounds normal. No respiratory distress.  Abdominal: Soft. Bowel sounds are normal. There is no tenderness.  Musculoskeletal: Normal range of motion. He exhibits no edema.  Lymphadenopathy:    He has no cervical adenopathy.       Right: No supraclavicular adenopathy present.       Left: No supraclavicular adenopathy present.  Neurological: He is alert and oriented to person, place, and time. No cranial nerve deficit. Gait normal.  Skin: Skin is warm and dry. No rash noted.  Psychiatric: Mood, memory, affect and judgment normal.  Nursing note and vitals reviewed.    LABORATORY DATA:  I have reviewed the labs as listed.  CBC    Component Value Date/Time   WBC 7.8 09/02/2016 0956   RBC 3.87 (L) 09/02/2016 0956   HGB 11.8 (L) 09/02/2016 0956   HCT 36.1 (L) 09/02/2016 0956   PLT 164 09/02/2016 0956   MCV 93.3 09/02/2016 0956   MCH 30.5 09/02/2016 0956   MCHC 32.7 09/02/2016 0956   RDW 14.3 09/02/2016 0956   LYMPHSABS 3.0 09/02/2016 0956   MONOABS 0.8 09/02/2016 0956   EOSABS 0.1 09/02/2016 0956   BASOSABS 0.0 09/02/2016 0956   CMP Latest Ref Rng & Units 09/02/2016 08/26/2016 07/18/2016  Glucose 65 - 99 mg/dL 164(H) 136(H) 151(H)  BUN 6 - 20 mg/dL 18 14 14   Creatinine 0.61 - 1.24 mg/dL 0.86 0.84 0.79  Sodium 135 - 145 mmol/L 137 138 134(L)  Potassium  3.5 - 5.1 mmol/L 4.5 3.7 3.6  Chloride 101 - 111 mmol/L 102 104 94(L)  CO2 22 - 32 mmol/L 27 26 32  Calcium 8.9 - 10.3 mg/dL 8.9 9.0 8.1(L)  Total Protein 6.5 - 8.1 g/dL 7.3 7.4 -  Total Bilirubin 0.3 - 1.2 mg/dL 0.5 0.6 -  Alkaline Phos 38 - 126 U/L 40 38 -  AST 15 - 41 U/L 22 23 -  ALT 17 - 63 U/L 15(L) 17 -    PENDING LABS:    DIAGNOSTIC IMAGING:  *The following radiologic images and reports have been reviewed independently and agree with below findings.  CT abd/pelvis: 07/14/16      PATHOLOGY:  Colon surgical resection: 07/11/16          ASSESSMENT & PLAN:   Stage IIIB adenocarcinoma of colon:  -Diagnosed in 05/2016. S/p surgical resection with colectomy on 07/11/16. Baseline CEA 2.6 on 07/12/16.  Now adjuvant chemotherapy with FOLFOX every 2 weeks, beginning 08/26/16.  -Tolerated cycle #1 chemotherapy quite well. Endorses diarrhea, which is managed by conservative home remedies (see below).  -Labs reviewed and are largely stable. No neutropenia or thrombocytopenia. Anemia persists, but remains stable.   Iron deficiency anemia:  -Iron studies from 08/26/16 reviewed. Ferritin low at 22; iron sats low at 11.  -Denies any bleeding in stools or dark/tarry stools. Calculated iron deficit ~439. Will make arrangements for 1 dose of IV Feraheme with next dose of chemo. Supportive therapy treatment plan created.   Diarrhea:  -Likely secondary to chemotherapy.  -Managed with flour and water. This decreases his stools to 1-2 times/day.  -Encouraged him to monitor for constipation and drink plenty of fluids.   Peripheral neuropathy:  -Numbness to bilat feet present prior to chemo; has diabetes. Symptoms no worse after cycle #1 chemo.  -Reviewed with him that his chemotherapy regimen may worsen these symptoms over time and can be a dose-limiting factor. We will continue to closely monitor.       Dispo:  -Return  to cancer center as directed for next cycle chemotherapy.     All questions were answered to patient's stated satisfaction. Encouraged patient to call with any new concerns or questions before his next visit to the cancer center and we can certain see him sooner, if needed.    Plan of care discussed with Dr. Oliva Bustard, who agrees with the above aforementioned.    Orders placed this encounter:  No orders of the defined types were placed in this encounter.     Kevin Craze, NP [place] [phone]

## 2016-09-02 NOTE — Progress Notes (Unsigned)
Please disregard previous encounter documentation entered on 09/02/16 @ 11:42AM.  Documented in error.

## 2016-09-02 NOTE — Patient Instructions (Addendum)
[place] at [facility] Discharge Instructions  RECOMMENDATIONS MADE BY THE CONSULTANT AND ANY TEST RESULTS WILL BE SENT TO YOUR REFERRING PHYSICIAN.  You saw Kevin Craze, NP, today Return as scheduled for next cycle chemo.  Please add infusion appt for IV iron with chemo infusion, if needed.  FU visit on day 1 cycle #3  See Danielle at checkout for appointments.  Thank you for choosing [place] at [facility] to provide your oncology and hematology care.  To afford each patient quality time with our provider, please arrive at least 15 minutes before your scheduled appointment time.    If you have a lab appointment with the [facility] please come in thru the  Main Entrance and check in at the main information desk  You need to re-schedule your appointment should you arrive 10 or more minutes late.  We strive to give you quality time with our providers, and arriving late affects you and other patients whose appointments are after yours.  Also, if you no show three or more times for appointments you may be dismissed from the clinic at the providers discretion.     Again, thank you for choosing [facility].  Our hope is that these requests will decrease the amount of time that you wait before being seen by our physicians.       _____________________________________________________________  Should you have questions after your visit to [facility], please contact our office at [phone] between the hours of 8:30 a.m. and 4:30 p.m.  Voicemails left after 4:30 p.m. will not be returned until the following business day.  For prescription refill requests, have your pharmacy contact our office.       Resources For Cancer Patients and their Caregivers ? American Cancer Society: Can assist with transportation, wigs, general needs, runs Look Good Feel Better.        367-064-6413 ? Cancer Care: Provides  financial assistance, online support groups, medication/co-pay assistance.  1-800-813-HOPE 930-585-3198) ? [place] Assists [place] cancer patients and their families through emotional , educational and financial support.  785 880 3001 ? [place] DSS Where to apply for food stamps, Medicaid and utility assistance. (902) 265-0225 ? [employer]: Transportation to medical appointments. (406) 468-8808 ? Social Security Administration: May apply for disability if have a Stage IV cancer. (860)877-6554 939-093-1612 ? [employer], Disability and Transit Services: Assists with nutrition, care and transit needs. [place] Support Programs: @10RELATIVEDAYS @ > Cancer Support Group  2nd Tuesday of the month 1pm-2pm, Journey Room  > Creative Journey  3rd Tuesday of the month 1130am-1pm, Journey Room  > Look Good Feel Better  1st Wednesday of the month 10am-12 noon, Journey Room (Call [place] to register [phone])

## 2016-09-07 DIAGNOSIS — D509 Iron deficiency anemia, unspecified: Secondary | ICD-10-CM | POA: Insufficient documentation

## 2016-09-09 ENCOUNTER — Encounter (HOSPITAL_BASED_OUTPATIENT_CLINIC_OR_DEPARTMENT_OTHER): Payer: Medicare Other

## 2016-09-09 ENCOUNTER — Encounter (HOSPITAL_COMMUNITY): Payer: Self-pay

## 2016-09-09 VITALS — BP 124/70 | HR 72 | Temp 98.1°F | Resp 18 | Wt 207.0 lb

## 2016-09-09 DIAGNOSIS — Z5111 Encounter for antineoplastic chemotherapy: Secondary | ICD-10-CM

## 2016-09-09 DIAGNOSIS — D509 Iron deficiency anemia, unspecified: Secondary | ICD-10-CM

## 2016-09-09 DIAGNOSIS — C184 Malignant neoplasm of transverse colon: Secondary | ICD-10-CM

## 2016-09-09 LAB — COMPREHENSIVE METABOLIC PANEL
ALT: 21 U/L (ref 17–63)
AST: 24 U/L (ref 15–41)
Albumin: 3.7 g/dL (ref 3.5–5.0)
Alkaline Phosphatase: 49 U/L (ref 38–126)
Anion gap: 8 (ref 5–15)
BUN: 15 mg/dL (ref 6–20)
CO2: 27 mmol/L (ref 22–32)
Calcium: 8.8 mg/dL — ABNORMAL LOW (ref 8.9–10.3)
Chloride: 104 mmol/L (ref 101–111)
Creatinine, Ser: 1.08 mg/dL (ref 0.61–1.24)
GFR calc Af Amer: >60 mL/min (ref >60)
GFR calc non Af Amer: >60 mL/min (ref >60)
Glucose, Bld: 139 mg/dL — ABNORMAL HIGH (ref 65–99)
Potassium: 3.8 mmol/L (ref 3.5–5.1)
Sodium: 139 mmol/L (ref 135–145)
Total Bilirubin: 0.7 mg/dL (ref 0.3–1.2)
Total Protein: 7.2 g/dL (ref 6.5–8.1)

## 2016-09-09 LAB — CBC WITH DIFFERENTIAL/PLATELET
Basophils Absolute: 0 10*3/uL (ref 0.0–0.1)
Basophils Relative: 0 %
Eosinophils Absolute: 0.1 10*3/uL (ref 0.0–0.7)
Eosinophils Relative: 2 %
HCT: 35.4 % — ABNORMAL LOW (ref 39.0–52.0)
Hemoglobin: 11.4 g/dL — ABNORMAL LOW (ref 13.0–17.0)
Lymphocytes Relative: 36 %
Lymphs Abs: 2.5 10*3/uL (ref 0.7–4.0)
MCH: 29.8 pg (ref 26.0–34.0)
MCHC: 32.2 g/dL (ref 30.0–36.0)
MCV: 92.4 fL (ref 78.0–100.0)
Monocytes Absolute: 0.8 10*3/uL (ref 0.1–1.0)
Monocytes Relative: 12 %
Neutro Abs: 3.3 10*3/uL (ref 1.7–7.7)
Neutrophils Relative %: 50 %
Platelets: 218 10*3/uL (ref 150–400)
RBC: 3.83 MIL/uL — ABNORMAL LOW (ref 4.22–5.81)
RDW: 14.4 % (ref 11.5–15.5)
WBC: 6.8 10*3/uL (ref 4.0–10.5)

## 2016-09-09 MED ORDER — DEXAMETHASONE SODIUM PHOSPHATE 10 MG/ML IJ SOLN
INTRAMUSCULAR | Status: AC
  Filled 2016-09-09: qty 1

## 2016-09-09 MED ORDER — LEUCOVORIN CALCIUM INJECTION 350 MG
405.0000 mg/m2 | Freq: Once | INTRAMUSCULAR | Status: AC
  Administered 2016-09-09: 900 mg via INTRAVENOUS
  Filled 2016-09-09: qty 35

## 2016-09-09 MED ORDER — SODIUM CHLORIDE 0.9% FLUSH: 10.0000 mL | INTRAVENOUS | Status: DC | PRN

## 2016-09-09 MED ORDER — DEXTROSE 5 % IV SOLN
Freq: Once | INTRAVENOUS | Status: AC
  Administered 2016-09-09: 10:00:00 via INTRAVENOUS

## 2016-09-09 MED ORDER — PALONOSETRON HCL INJECTION 0.25 MG/5ML
0.2500 mg | Freq: Once | INTRAVENOUS | Status: AC
  Administered 2016-09-09: 0.25 mg via INTRAVENOUS

## 2016-09-09 MED ORDER — FLUOROURACIL CHEMO INJECTION 2.5 GM/50ML
400.0000 mg/m2 | Freq: Once | INTRAVENOUS | Status: AC
  Administered 2016-09-09: 900 mg via INTRAVENOUS
  Filled 2016-09-09: qty 18

## 2016-09-09 MED ORDER — SODIUM CHLORIDE 0.9% FLUSH
10.0000 mL | INTRAVENOUS | Status: DC | PRN
  Administered 2016-09-09: 10 mL
  Filled 2016-09-09: qty 10

## 2016-09-09 MED ORDER — PALONOSETRON HCL INJECTION 0.25 MG/5ML
INTRAVENOUS | Status: AC
  Filled 2016-09-09: qty 5

## 2016-09-09 MED ORDER — FERUMOXYTOL INJECTION 510 MG/17 ML
510.0000 mg | Freq: Once | INTRAVENOUS | Status: AC
  Administered 2016-09-09: 510 mg via INTRAVENOUS
  Filled 2016-09-09: qty 17

## 2016-09-09 MED ORDER — SODIUM CHLORIDE 0.9 % IV SOLN
Freq: Once | INTRAVENOUS | Status: AC
  Administered 2016-09-09: 09:00:00 via INTRAVENOUS

## 2016-09-09 MED ORDER — DEXAMETHASONE SODIUM PHOSPHATE 10 MG/ML IJ SOLN
10.0000 mg | Freq: Once | INTRAMUSCULAR | Status: AC
  Administered 2016-09-09: 10 mg via INTRAVENOUS

## 2016-09-09 MED ORDER — OXALIPLATIN CHEMO INJECTION 100 MG/20ML
85.0000 mg/m2 | Freq: Once | INTRAVENOUS | Status: AC
  Administered 2016-09-09: 190 mg via INTRAVENOUS
  Filled 2016-09-09: qty 38

## 2016-09-09 MED ORDER — SODIUM CHLORIDE 0.9 % IV SOLN
2400.0000 mg/m2 | INTRAVENOUS | Status: DC
  Administered 2016-09-09: 5350 mg via INTRAVENOUS
  Filled 2016-09-09: qty 50

## 2016-09-09 NOTE — Patient Instructions (Signed)
[facility] Discharge Instructions for Patients Receiving Chemotherapy   Beginning January 23rd 2017 lab work for the [facility] will be done in the  Main lab at [facility] on 1st floor. If you have a lab appointment with the [facility] please come in thru the  Main Entrance and check in at the main information desk   Today you received the following chemotherapy agents Oxaliplatin,Leucovorin. And 5FU as well as Feraheme. Follow-up as scheduled. Call clinic for any questions or concerns  To help prevent nausea and vomiting after your treatment, we encourage you to take your nausea medication   If you develop nausea and vomiting, or diarrhea that is not controlled by your medication, call the clinic.  The clinic phone number is [phone]. Office hours are Monday-Friday 8:30am-5:00pm.  BELOW ARE SYMPTOMS THAT SHOULD BE REPORTED IMMEDIATELY:  *FEVER GREATER THAN 101.0 F  *CHILLS WITH OR WITHOUT FEVER  NAUSEA AND VOMITING THAT IS NOT CONTROLLED WITH YOUR NAUSEA MEDICATION  *UNUSUAL SHORTNESS OF BREATH  *UNUSUAL BRUISING OR BLEEDING  TENDERNESS IN MOUTH AND THROAT WITH OR WITHOUT PRESENCE OF ULCERS  *URINARY PROBLEMS  *BOWEL PROBLEMS  UNUSUAL RASH Items with * indicate a potential emergency and should be followed up as soon as possible. If you have an emergency after office hours please contact your primary care physician or go to the nearest emergency department.  Please call the clinic during office hours if you have any questions or concerns.   You may also contact the Patient Navigator at [phone] should you have any questions or need assistance in obtaining follow up care.      Resources For Cancer Patients and their Caregivers ? American Cancer Society: Can assist with transportation, wigs, general needs, runs Look Good Feel Better.        (402)821-9513 ? Cancer Care: Provides financial assistance, online support groups,  medication/co-pay assistance.  1-800-813-HOPE (906)675-3071) ? [place] Assists [place] cancer patients and their families through emotional , educational and financial support.  313-437-2690 ? [place] DSS Where to apply for food stamps, Medicaid and utility assistance. 567-159-6768 ? [employer]: Transportation to medical appointments. 201-348-0684 ? Social Security Administration: May apply for disability if have a Stage IV cancer. 561-569-9238 812-664-1204 ? [employer], Disability and Transit Services: Assists with nutrition, care and transit needs. 902-006-5485

## 2016-09-09 NOTE — Progress Notes (Signed)
Kevin Kirk tolerated chemo tx and Feraheme infusion well without complaints or incident.Labs reviewed with Dr. Talbert Cage prior to administering chemotherapy. Pt discharged with 5FU pump infusing without issues. VSS upon discharge. Pt discharged self ambulatory in satisfactory condition accompanied by a friend

## 2016-09-11 ENCOUNTER — Encounter (HOSPITAL_BASED_OUTPATIENT_CLINIC_OR_DEPARTMENT_OTHER): Payer: Medicare Other

## 2016-09-11 VITALS — BP 139/69 | HR 72 | Temp 98.3°F | Resp 20

## 2016-09-11 DIAGNOSIS — Z452 Encounter for adjustment and management of vascular access device: Secondary | ICD-10-CM

## 2016-09-11 DIAGNOSIS — C184 Malignant neoplasm of transverse colon: Secondary | ICD-10-CM

## 2016-09-11 MED ORDER — SODIUM CHLORIDE 0.9% FLUSH
10.0000 mL | INTRAVENOUS | Status: DC | PRN
  Administered 2016-09-11: 10 mL
  Filled 2016-09-11: qty 10

## 2016-09-11 MED ORDER — HEPARIN SOD (PORK) LOCK FLUSH 100 UNIT/ML IV SOLN
500.0000 [IU] | Freq: Once | INTRAVENOUS | Status: AC | PRN
  Administered 2016-09-11: 500 [IU]
  Filled 2016-09-11: qty 5

## 2016-09-11 NOTE — Progress Notes (Signed)
Kevin Kirk presented for Portacath deaccess/pump removal. Portacath located left chest wall.  Good blood return present. Portacath flushed with 70ml NS and 500U/23ml Heparin and needle removed intact. Procedure without incident. Patient tolerated procedure well.  Tolerated infusion without any problems. Discharged ambulatory and within stable condition.

## 2016-09-23 ENCOUNTER — Encounter (HOSPITAL_COMMUNITY): Payer: Self-pay

## 2016-09-23 ENCOUNTER — Encounter (HOSPITAL_COMMUNITY): Payer: Medicare Other | Attending: Oncology

## 2016-09-23 ENCOUNTER — Encounter (HOSPITAL_BASED_OUTPATIENT_CLINIC_OR_DEPARTMENT_OTHER): Payer: Medicare Other | Admitting: Oncology

## 2016-09-23 VITALS — BP 141/57 | HR 62 | Temp 97.5°F | Resp 18

## 2016-09-23 VITALS — BP 133/65 | HR 67 | Resp 18 | Ht 73.0 in | Wt 211.0 lb

## 2016-09-23 DIAGNOSIS — C184 Malignant neoplasm of transverse colon: Secondary | ICD-10-CM | POA: Diagnosis present

## 2016-09-23 DIAGNOSIS — D509 Iron deficiency anemia, unspecified: Secondary | ICD-10-CM | POA: Diagnosis present

## 2016-09-23 DIAGNOSIS — D649 Anemia, unspecified: Secondary | ICD-10-CM | POA: Diagnosis not present

## 2016-09-23 DIAGNOSIS — Z5111 Encounter for antineoplastic chemotherapy: Secondary | ICD-10-CM

## 2016-09-23 DIAGNOSIS — C189 Malignant neoplasm of colon, unspecified: Secondary | ICD-10-CM

## 2016-09-23 LAB — COMPREHENSIVE METABOLIC PANEL
ALT: 25 U/L (ref 17–63)
AST: 27 U/L (ref 15–41)
Albumin: 3.6 g/dL (ref 3.5–5.0)
Alkaline Phosphatase: 44 U/L (ref 38–126)
Anion gap: 8 (ref 5–15)
BUN: 9 mg/dL (ref 6–20)
CO2: 28 mmol/L (ref 22–32)
Calcium: 9.2 mg/dL (ref 8.9–10.3)
Chloride: 99 mmol/L — ABNORMAL LOW (ref 101–111)
Creatinine, Ser: 0.81 mg/dL (ref 0.61–1.24)
GFR calc Af Amer: >60 mL/min (ref >60)
GFR calc non Af Amer: >60 mL/min (ref >60)
Glucose, Bld: 145 mg/dL — ABNORMAL HIGH (ref 65–99)
Potassium: 4 mmol/L (ref 3.5–5.1)
Sodium: 135 mmol/L (ref 135–145)
Total Bilirubin: 0.9 mg/dL (ref 0.3–1.2)
Total Protein: 7 g/dL (ref 6.5–8.1)

## 2016-09-23 LAB — CBC WITH DIFFERENTIAL/PLATELET
Basophils Absolute: 0.1 10*3/uL (ref 0.0–0.1)
Basophils Relative: 1 %
Eosinophils Absolute: 0.1 10*3/uL (ref 0.0–0.7)
Eosinophils Relative: 1 %
HCT: 35.8 % — ABNORMAL LOW (ref 39.0–52.0)
Hemoglobin: 11.6 g/dL — ABNORMAL LOW (ref 13.0–17.0)
Lymphocytes Relative: 43 %
Lymphs Abs: 3 10*3/uL (ref 0.7–4.0)
MCH: 30.4 pg (ref 26.0–34.0)
MCHC: 32.4 g/dL (ref 30.0–36.0)
MCV: 94 fL (ref 78.0–100.0)
Monocytes Absolute: 1 10*3/uL (ref 0.1–1.0)
Monocytes Relative: 15 %
Neutro Abs: 2.8 10*3/uL (ref 1.7–7.7)
Neutrophils Relative %: 40 %
Platelets: 161 10*3/uL (ref 150–400)
RBC: 3.81 MIL/uL — ABNORMAL LOW (ref 4.22–5.81)
RDW: 16.2 % — ABNORMAL HIGH (ref 11.5–15.5)
WBC: 6.9 10*3/uL (ref 4.0–10.5)

## 2016-09-23 MED ORDER — PALONOSETRON HCL INJECTION 0.25 MG/5ML
0.2500 mg | Freq: Once | INTRAVENOUS | Status: AC
  Administered 2016-09-23: 0.25 mg via INTRAVENOUS
  Filled 2016-09-23: qty 5

## 2016-09-23 MED ORDER — DEXTROSE 5 % IV SOLN
Freq: Once | INTRAVENOUS | Status: AC
  Administered 2016-09-23: 12:00:00 via INTRAVENOUS

## 2016-09-23 MED ORDER — SODIUM CHLORIDE 0.9 % IV SOLN
2400.0000 mg/m2 | INTRAVENOUS | Status: DC
  Administered 2016-09-23: 5350 mg via INTRAVENOUS
  Filled 2016-09-23: qty 107

## 2016-09-23 MED ORDER — FLUOROURACIL CHEMO INJECTION 2.5 GM/50ML
400.0000 mg/m2 | Freq: Once | INTRAVENOUS | Status: AC
  Administered 2016-09-23: 900 mg via INTRAVENOUS
  Filled 2016-09-23: qty 18

## 2016-09-23 MED ORDER — DEXAMETHASONE SODIUM PHOSPHATE 10 MG/ML IJ SOLN
10.0000 mg | Freq: Once | INTRAMUSCULAR | Status: AC
  Administered 2016-09-23: 10 mg via INTRAVENOUS
  Filled 2016-09-23: qty 1

## 2016-09-23 MED ORDER — OXALIPLATIN CHEMO INJECTION 100 MG/20ML
85.0000 mg/m2 | Freq: Once | INTRAVENOUS | Status: AC
  Administered 2016-09-23: 190 mg via INTRAVENOUS
  Filled 2016-09-23: qty 38

## 2016-09-23 MED ORDER — FIRST-DUKES MOUTHWASH MT SUSP: 5.0000 mL | Freq: Four times a day (QID) | OROMUCOSAL | 3 refills | Status: DC | PRN

## 2016-09-23 MED ORDER — LEUCOVORIN CALCIUM INJECTION 350 MG
405.0000 mg/m2 | Freq: Once | INTRAVENOUS | Status: AC
  Administered 2016-09-23: 900 mg via INTRAVENOUS
  Filled 2016-09-23: qty 35

## 2016-09-23 NOTE — Progress Notes (Signed)
Tolerated infusion w/o adverse reaction.  Alert, in no distress.  VSS.  Discharged ambulatory.  

## 2016-09-23 NOTE — Progress Notes (Signed)
[facility] NOTE  Patient Care Team: Tobe Sos, MD as PCP - General (Internal Medicine)  CHIEF COMPLAINTS/PURPOSE OF CONSULTATION:  Adenocarcinoma of the colon, Stage IIIB   HISTORY OF PRESENTING ILLNESS:  Kevin Kirk 83 y.o. male is here because of referral by Dr. Aviva Signs. The patient initially noticed bright red blood in his stool in December 2017.   The patient underwent colonoscopy on 06/05/16 with Dr. Laural Golden which showed a fungating, polypoid and ulcerated non-obstructing large mass was found in the mid transverse colon. The mass was partially circumferential (involving two-thirds of the lumen circumference). The mass measured three cm in length. No bleeding was present. Subsequent biopsy on 06/05/16 revealed adenocarcinoma.  06/20/16: CT abd/pelvis with contrast: Mild focal wall thickening along the proximal/mid transverse colon in the right mid abdomen, possibly corresponding to the abnormality on colonoscopy, equivocal. No findings suspicious for metastatic disease in the abdomen/pelvis.  07/11/16: Partial colectomy by Dr. Arnoldo Morale. This confirmed adenocarcinoma of the colon, grade 2, spanning 3.8 cm. The tumor invaded through the muscularis propria into the pericolonic soft tissues. Additional lymphovascular invasion was identified. Margins of resection were negative.  Of 16 lymph nodes sampled, one was found positive for metastatic disease. All other lymph nodes negative.  08/04/16: CTA chest: No demonstrable pulmonary embolus. Ascending thoracic aortic diameter is 4.6 x 4.4 cm. No dissection evident. Ascending thoracic aortic aneurysm. Granuloma right base, calcified. Bibasilar atelectasis. Mild calcification along the posterior right apical pleural region without pleural thickening or mass associated. No adenopathy. Scattered foci of aortic and coronary artery calcification. Status post aortic valve replacement.  Baseline CEA: 2.6  Patient  presents for follow up today and to get cycle 3 of FOLFOX. Overall he is doing well. He does state that he's noted that he is having diarrhea from the FOLFOX however has not been really taking D Imodium. He states that he has been doing a home remedy with a mixture of flour and water that he is drinking at home to firm up stools. He also states that he's noted some mouth sores, however they do not prohibit him from eating. He has gained weight roughly 2-3 pounds since his last visit. He denies any pain symptoms. He denies any chest pain, shortness breath, abdominal pain, focal weakness.  MEDICAL HISTORY:  Past Medical History:  Diagnosis Date  . Chronic pulmonary embolism ([place]) 75/9163   compliction of the valve replacement surgery  . COPD (chronic obstructive pulmonary disease) ([place])   . Diabetes ([place]) 04/22/2016  . H/O aortic valve replacement 10/2008  . Hypercholesteremia   . Hypertension   . Pulmonary emboli ([place]) 10/2008  . Sleep apnea     SURGICAL HISTORY: Past Surgical History:  Procedure Laterality Date  . AORTIC VALVE REPLACEMENT     2010  . CARDIAC CATHETERIZATION  2010  . COLONOSCOPY N/A 06/05/2016   Procedure: COLONOSCOPY;  Surgeon: Rogene Houston, MD;  Location: [facility];  Service: Endoscopy;  Laterality: N/A;  . PARTIAL COLECTOMY N/A 07/11/2016   Procedure: PARTIAL COLECTOMY;  Surgeon: Aviva Signs, MD;  Location: [facility] ORS;  Service: General;  Laterality: N/A;  . PORTACATH PLACEMENT N/A 08/13/2016   Procedure: INSERTION PORT-A-CATH LEFT SUBCLAVIAN;  Surgeon: Aviva Signs, MD;  Location: [facility] ORS;  Service: General;  Laterality: N/A;  . REPLACEMENT TOTAL KNEE     1996  . spenectomy     from trauma    SOCIAL HISTORY: Social History   Social History  .  Marital status: Widowed    Spouse name: N/A  . Number of children: N/A  . Years of education: N/A   Occupational History  . Not on file.   Social History Main Topics  . Smoking status: Former Smoker    Years:  15.00    Types: Cigarettes    Quit date: 1964  . Smokeless tobacco: Never Used  . Alcohol use No  . Drug use: No  . Sexual activity: Yes   Other Topics Concern  . Not on file   Social History Narrative  . No narrative on file  The patient reports his wife passed in September 2017, and he now lives alone. The patient has a granddaughter who lives near him and is available to assist him as needed. The patient is a former smoker.  FAMILY HISTORY: Family History  Problem Relation Age of Onset  . Colon cancer Neg Hx     ALLERGIES:  is allergic to amoxicillin and tamsulosin hcl.  MEDICATIONS:  Current Outpatient Prescriptions  Medication Sig Dispense Refill  . acetaminophen (TYLENOL) 500 MG tablet Take 500 mg by mouth daily as needed for moderate pain or headache.    Marland Kitchen aspirin EC 81 MG tablet Take 81 mg by mouth daily.    Marland Kitchen atorvastatin (LIPITOR) 10 MG tablet Take 10 mg by mouth at bedtime.     . Cholecalciferol (VITAMIN D3) 5000 units CAPS Take 5,000 Units by mouth daily.     . ferrous sulfate 325 (65 FE) MG tablet Take 325 mg by mouth daily with breakfast.    . finasteride (PROSCAR) 5 MG tablet Take 5 mg by mouth at bedtime.     . fluorouracil CALGB 16109 in sodium chloride 0.9 % 150 mL Inject into the vein over 96 hr. Every 2 weeks, infusion pump for 46 hours    . leucovorin in dextrose 5 % 250 mL Inject into the vein once. Every 2 weeks    . lidocaine-prilocaine (EMLA) cream Apply to affected area once 30 g 3  . lisinopril (PRINIVIL,ZESTRIL) 5 MG tablet Take 5 mg by mouth at bedtime.     Marland Kitchen MAGNESIUM PO Take 265 mg by mouth daily.    . metFORMIN (GLUCOPHAGE) 500 MG tablet Take 500 mg by mouth 2 (two) times daily.    . metoprolol (LOPRESSOR) 50 MG tablet Take 50 mg by mouth 2 (two) times daily.    . ondansetron (ZOFRAN) 8 MG tablet Take 1 tablet (8 mg total) by mouth 2 (two) times daily as needed for refractory nausea / vomiting. Start on day 3 after chemotherapy. 30 tablet 1  .  OXALIPLATIN IV Inject into the vein. Every 2 weeks    . prochlorperazine (COMPAZINE) 10 MG tablet Take 1 tablet (10 mg total) by mouth every 6 (six) hours as needed (Nausea or vomiting). 30 tablet 1  . vitamin B-12 (CYANOCOBALAMIN) 1000 MCG tablet Take 1,000 mcg by mouth daily.     . Diphenhyd-Hydrocort-Nystatin (FIRST-DUKES MOUTHWASH) SUSP Use as directed 5 mLs in the mouth or throat 4 (four) times daily as needed. 237 mL 3   No current facility-administered medications for this visit.    Facility-Administered Medications Ordered in Other Visits  Medication Dose Route Frequency Provider Last Rate Last Dose  . fluorouracil (ADRUCIL) 5,350 mg in sodium chloride 0.9 % 143 mL chemo infusion  2,400 mg/m2 (Treatment Plan Recorded) Intravenous 1 day or 1 dose Twana First, MD      . fluorouracil (ADRUCIL) chemo injection 900  mg  400 mg/m2 (Treatment Plan Recorded) Intravenous Once Twana First, MD      . leucovorin 900 mg in dextrose 5 % 250 mL infusion  405 mg/m2 (Treatment Plan Recorded) Intravenous Once Twana First, MD 148 mL/hr at 09/23/16 1155 900 mg at 09/23/16 1155  . oxaliplatin (ELOXATIN) 190 mg in dextrose 5 % 500 mL chemo infusion  85 mg/m2 (Treatment Plan Recorded) Intravenous Once Twana First, MD 269 mL/hr at 09/23/16 1155 190 mg at 09/23/16 1155    Review of Systems  Constitutional: Negative.  Negative for chills and fever.  HENT: Negative.  Negative for hearing loss, sore throat and tinnitus.  +mouth sores Eyes: Negative.  Negative for blurred vision, photophobia and discharge.  Respiratory: Negative.  Negative for cough, hemoptysis, shortness of breath and wheezing.   Cardiovascular: Negative.  Negative for chest pain, palpitations, orthopnea, claudication and leg swelling.  Gastrointestinal: Negative for abdominal pain, constipation, melena, nausea and vomiting. +diarrhea Genitourinary: Negative.  Negative for dysuria and hematuria.  Musculoskeletal: Negative.  Negative for back  pain, joint pain and myalgias.  Skin: Negative.  Negative for itching and rash.  Neurological: Negative.  Negative for dizziness, weakness and headaches.  Endo/Heme/Allergies: Negative.  Negative for environmental allergies and polydipsia. Does not bruise/bleed easily.  Psychiatric/Behavioral: Negative.  Negative for depression. The patient is not nervous/anxious and does not have insomnia.   All other systems reviewed and are negative.  14 point ROS was done and is otherwise as detailed above or in HPI   PHYSICAL EXAMINATION: ECOG PERFORMANCE STATUS: 0 - Asymptomatic  Vitals:   09/23/16 1029  BP: 133/65  Pulse: 67  Resp: 18   Filed Weights   09/23/16 1029  Weight: 211 lb (95.7 kg)     Physical Exam  Constitutional: He is oriented to person, place, and time and well-developed, well-nourished, and in no distress. No distress.  HENT:  Head: Normocephalic and atraumatic.  Mouth/Throat: No oropharyngeal exudate.  Eyes: Conjunctivae and EOM are normal. Pupils are equal, round, and reactive to light. No scleral icterus.  Neck: Normal range of motion. Neck supple. No JVD present.  Cardiovascular: Normal rate, regular rhythm and normal heart sounds.  Exam reveals no gallop and no friction rub.   No murmur heard. Pulmonary/Chest: Effort normal and breath sounds normal. No respiratory distress. He has no wheezes. He has no rales. He exhibits no tenderness.  Abdominal: Soft. Bowel sounds are normal. He exhibits no distension and no mass. There is no tenderness. There is no rebound and no guarding.  Musculoskeletal: Normal range of motion. He exhibits no edema or tenderness.  Lymphadenopathy:    He has no cervical adenopathy.  Neurological: He is alert and oriented to person, place, and time. No cranial nerve deficit. Gait normal.  Skin: Skin is warm and dry. No rash noted. No erythema. No pallor.  Psychiatric: Affect and judgment normal.  Nursing note and vitals  reviewed.     LABORATORY DATA:  I have reviewed the data as listed Lab Results  Component Value Date   WBC 6.9 09/23/2016   HGB 11.6 (L) 09/23/2016   HCT 35.8 (L) 09/23/2016   MCV 94.0 09/23/2016   PLT 161 09/23/2016   CMP     Component Value Date/Time   NA 135 09/23/2016 1015   K 4.0 09/23/2016 1015   CL 99 (L) 09/23/2016 1015   CO2 28 09/23/2016 1015   GLUCOSE 145 (H) 09/23/2016 1015   BUN 9 09/23/2016 1015   CREATININE  0.81 09/23/2016 1015   CALCIUM 9.2 09/23/2016 1015   PROT 7.0 09/23/2016 1015   ALBUMIN 3.6 09/23/2016 1015   AST 27 09/23/2016 1015   ALT 25 09/23/2016 1015   ALKPHOS 44 09/23/2016 1015   BILITOT 0.9 09/23/2016 1015   GFRNONAA >60 09/23/2016 1015   GFRAA >60 09/23/2016 1015   PATHOLOGY  Diagnosis 07/11/16 Colon, segmental resection for tumor, right ADENOCARCINOMA OF THE COLON, GRADE 2 (3.8 CM) THE TUMOR INVADES THROUGH THE MUSCULARIS PROPRIA INTO PERICOLONIC SOFT TISSUE (PT3) LYMPHOVASCULAR INVASION IS IDENTIFIED MARGINS OF RESECTION ARE NEGATIVE FOR CARCINOMA METASTATIC ADENOCARCINOMA IN ONE OF SIXTEEN LYMPH NODE (1/16) ONE EXTRAMURAL SATELLITE TUMOR NODULE UNREMARKABLE APPENDIX BENIGN ECTOPIC SPLEEN TISSUE Microscopic Comment COLON AND RECTUM (INCLUDING TRANS-ANAL RESECTION): Specimen: Right colon Procedure: Segmental resection Tumor site: distal right colon Specimen integrity: Intact Macroscopic intactness of mesorectum: Not applicable: x Complete: NA Near complete: NA Incomplete: NA Cannot be determined (specify): NA Macroscopic tumor perforation: Into subserosal adipose tissue Invasive tumor: Maximum size: 3.8 cm Histologic type(s): Adenocarcinoma Histologic grade and differentiation: G1: well differentiated/low grade G2: moderately differentiated/low grade 1 of 5 Supplemental copy SUPPLEMENTAL for MARQUAVIUS, SCAIFE ([id number]) Microscopic Comment(continued) G3: poorly differentiated/high grade G4: undifferentiated/high  grade Type of polyp in which invasive carcinoma arose: Tubular adenoma Microscopic extension of invasive tumor: Into peri colonic soft tissue Lymph-Vascular invasion: Present Peri-neural invasion: Negative Tumor deposit(s) (discontinuous extramural extension): present Resection margins: Proximal margin: Negative Distal margin: Negative Circumferential (radial) (posterior ascending, posterior descending; lateral and posterior mid-rectum; and entire lower 1/3 rectum): 2.1 cm Mesenteric margin (sigmoid and transverse): negative Distance closest margin (if all above margins negative): 1.5 cm Trans-anal resection margins only: Deep margin: NA Mucosal Margin: NA Distance closest mucosal margin (if negative): NA Treatment effect (neo-adjuvant therapy): Negative Additional polyp(s): Negative Non-neoplastic findings: Ectopic spleen tissue Lymph nodes: number examined 16; number positive: 1 Pathologic Staging: pT3, pN1, pMx Ancillary studies: ordered  Diagnosis 06/05/16 Colon, polyp(s), ulcerative mass - ADENOCARCINOMA.   RADIOGRAPHIC STUDIES: I have personally reviewed the radiological images as listed and agreed with the findings in the report. No results found.  ASSESSMENT & PLAN:  Cancer Staging 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[place]) Staging form: Colon and Rectum, AJCC 8th Edition - Clinical: Stage IIIB (cT3, cN1a, cM0) - Signed by Twana First, MD on 08/05/2016  Adenocarcinoma of the colon, Stage IIIB Mild normocytic anemia likely due to blood loss from colon cancer  PLAN: Continue with adjuvant chemotherapy with FOLFOX q2 weeks for 12 cycles, cycle 3 today. Labs reviewed.  I have sent in a Rx for Dukes mouthwash to help with his mucositis. I have gone over how to take his imodium to help prevent diarrhea. Return in 4 weeks for follow up.   All questions were answered. The patient knows to call the clinic with any problems, questions or concerns.   Twana First, MD   09/23/2016 12:23 PM

## 2016-09-25 ENCOUNTER — Encounter (HOSPITAL_COMMUNITY): Payer: Self-pay

## 2016-09-25 ENCOUNTER — Encounter (HOSPITAL_BASED_OUTPATIENT_CLINIC_OR_DEPARTMENT_OTHER): Payer: Medicare Other

## 2016-09-25 VITALS — BP 118/76 | HR 63 | Temp 98.1°F | Resp 18

## 2016-09-25 DIAGNOSIS — C184 Malignant neoplasm of transverse colon: Secondary | ICD-10-CM

## 2016-09-25 DIAGNOSIS — Z452 Encounter for adjustment and management of vascular access device: Secondary | ICD-10-CM

## 2016-09-25 MED ORDER — SODIUM CHLORIDE 0.9% FLUSH
10.0000 mL | INTRAVENOUS | Status: DC | PRN
  Administered 2016-09-25: 10 mL
  Filled 2016-09-25: qty 10

## 2016-09-25 MED ORDER — HEPARIN SOD (PORK) LOCK FLUSH 100 UNIT/ML IV SOLN
500.0000 [IU] | Freq: Once | INTRAVENOUS | Status: AC | PRN
  Administered 2016-09-25: 500 [IU]

## 2016-09-25 NOTE — Progress Notes (Signed)
Kevin Kirk presents to have home infusion pump d/c'd and for port-a-cath deaccess with flush.  Portacath located left chest wall accessed with  H 20 needle.  Good blood return present. Portacath flushed with NS and 500U/5ml Heparin, and needle removed intact.  Procedure tolerated well and without incident.  Discharged ambulatory.  

## 2016-09-30 ENCOUNTER — Telehealth (HOSPITAL_COMMUNITY): Payer: Self-pay | Admitting: Emergency Medicine

## 2016-09-30 NOTE — Telephone Encounter (Signed)
[facility] wanted to know if they could substitute the store compound for the first dukes mouth wash.  Kirby Crigler PA said this was ok to do.  Spoke with the pharmacist named Jenny Reichmann.  He verbalized understanding.

## 2016-10-07 ENCOUNTER — Encounter (HOSPITAL_BASED_OUTPATIENT_CLINIC_OR_DEPARTMENT_OTHER): Payer: Medicare Other

## 2016-10-07 VITALS — BP 154/81 | HR 64 | Temp 97.6°F | Resp 18 | Wt 207.2 lb

## 2016-10-07 DIAGNOSIS — Z5111 Encounter for antineoplastic chemotherapy: Secondary | ICD-10-CM | POA: Diagnosis present

## 2016-10-07 DIAGNOSIS — C184 Malignant neoplasm of transverse colon: Secondary | ICD-10-CM

## 2016-10-07 LAB — COMPREHENSIVE METABOLIC PANEL
ALT: 22 U/L (ref 17–63)
AST: 27 U/L (ref 15–41)
Albumin: 3.8 g/dL (ref 3.5–5.0)
Alkaline Phosphatase: 46 U/L (ref 38–126)
Anion gap: 8 (ref 5–15)
BUN: 11 mg/dL (ref 6–20)
CO2: 28 mmol/L (ref 22–32)
Calcium: 9.2 mg/dL (ref 8.9–10.3)
Chloride: 99 mmol/L — ABNORMAL LOW (ref 101–111)
Creatinine, Ser: 0.91 mg/dL (ref 0.61–1.24)
GFR calc Af Amer: >60 mL/min (ref >60)
GFR calc non Af Amer: >60 mL/min (ref >60)
Glucose, Bld: 147 mg/dL — ABNORMAL HIGH (ref 65–99)
Potassium: 4.1 mmol/L (ref 3.5–5.1)
Sodium: 135 mmol/L (ref 135–145)
Total Bilirubin: 0.9 mg/dL (ref 0.3–1.2)
Total Protein: 7.1 g/dL (ref 6.5–8.1)

## 2016-10-07 LAB — CBC WITH DIFFERENTIAL/PLATELET
Basophils Absolute: 0 10*3/uL (ref 0.0–0.1)
Basophils Relative: 1 %
Eosinophils Absolute: 0.2 10*3/uL (ref 0.0–0.7)
Eosinophils Relative: 2 %
HCT: 36.5 % — ABNORMAL LOW (ref 39.0–52.0)
Hemoglobin: 11.9 g/dL — ABNORMAL LOW (ref 13.0–17.0)
Lymphocytes Relative: 42 %
Lymphs Abs: 3.2 10*3/uL (ref 0.7–4.0)
MCH: 31 pg (ref 26.0–34.0)
MCHC: 32.6 g/dL (ref 30.0–36.0)
MCV: 95.1 fL (ref 78.0–100.0)
Monocytes Absolute: 1.3 10*3/uL — ABNORMAL HIGH (ref 0.1–1.0)
Monocytes Relative: 17 %
Neutro Abs: 2.8 10*3/uL (ref 1.7–7.7)
Neutrophils Relative %: 38 %
Platelets: 130 10*3/uL — ABNORMAL LOW (ref 150–400)
RBC: 3.84 MIL/uL — ABNORMAL LOW (ref 4.22–5.81)
RDW: 18 % — ABNORMAL HIGH (ref 11.5–15.5)
WBC: 7.4 10*3/uL (ref 4.0–10.5)

## 2016-10-07 MED ORDER — DEXTROSE 5 % IV SOLN
Freq: Once | INTRAVENOUS | Status: AC
  Administered 2016-10-07: 12:00:00 via INTRAVENOUS

## 2016-10-07 MED ORDER — FLUOROURACIL CHEMO INJECTION 2.5 GM/50ML
400.0000 mg/m2 | Freq: Once | INTRAVENOUS | Status: AC
Start: 2016-10-07 — End: 2016-10-07
  Administered 2016-10-07: 900 mg via INTRAVENOUS
  Filled 2016-10-07: qty 18

## 2016-10-07 MED ORDER — PALONOSETRON HCL INJECTION 0.25 MG/5ML
0.2500 mg | Freq: Once | INTRAVENOUS | Status: AC
  Administered 2016-10-07: 0.25 mg via INTRAVENOUS
  Filled 2016-10-07: qty 5

## 2016-10-07 MED ORDER — DEXAMETHASONE SODIUM PHOSPHATE 10 MG/ML IJ SOLN
10.0000 mg | Freq: Once | INTRAMUSCULAR | Status: AC
Start: 2016-10-07 — End: 2016-10-07
  Administered 2016-10-07: 10 mg via INTRAVENOUS
  Filled 2016-10-07: qty 1

## 2016-10-07 MED ORDER — FLUOROURACIL CHEMO INJECTION 5 GM/100ML
2400.0000 mg/m2 | INTRAVENOUS | Status: DC
  Administered 2016-10-07: 5350 mg via INTRAVENOUS
  Filled 2016-10-07: qty 100

## 2016-10-07 MED ORDER — SODIUM CHLORIDE 0.9% FLUSH: 10.0000 mL | INTRAVENOUS | Status: DC | PRN

## 2016-10-07 MED ORDER — OXALIPLATIN CHEMO INJECTION 100 MG/20ML
85.0000 mg/m2 | Freq: Once | INTRAVENOUS | Status: AC
  Administered 2016-10-07: 190 mg via INTRAVENOUS
  Filled 2016-10-07: qty 38

## 2016-10-07 MED ORDER — HEPARIN SOD (PORK) LOCK FLUSH 100 UNIT/ML IV SOLN
500.0000 [IU] | Freq: Once | INTRAVENOUS | Status: DC | PRN
  Filled 2016-10-07: qty 5

## 2016-10-07 MED ORDER — LEUCOVORIN CALCIUM INJECTION 350 MG
405.0000 mg/m2 | Freq: Once | INTRAVENOUS | Status: AC
  Administered 2016-10-07: 900 mg via INTRAVENOUS
  Filled 2016-10-07: qty 10

## 2016-10-07 NOTE — Patient Instructions (Addendum)
[facility] Discharge Instructions for Patients Receiving Chemotherapy    If you have a lab appointment with the [facility] please come in thru the  Main Entrance and check in at the main information desk   Today you received the following chemotherapy agents: Oxaliplatin, Leucovorin, 5-FU (flurourocil)   You will go home with the home infusion pump for 46 hours. Follow up on Thursday to have your pump removed and port a cath deaccessed.    To help prevent nausea and vomiting after your treatment, we encourage you to take your nausea medication as prescribed.   If you develop nausea and vomiting, or diarrhea that is not controlled by your medication, call the clinic.  The clinic phone number is [phone]. Office hours are Monday-Friday 8:30am-5:00pm.  BELOW ARE SYMPTOMS THAT SHOULD BE REPORTED IMMEDIATELY:  *FEVER GREATER THAN 101.0 F  *CHILLS WITH OR WITHOUT FEVER  NAUSEA AND VOMITING THAT IS NOT CONTROLLED WITH YOUR NAUSEA MEDICATION  *UNUSUAL SHORTNESS OF BREATH  *UNUSUAL BRUISING OR BLEEDING  TENDERNESS IN MOUTH AND THROAT WITH OR WITHOUT PRESENCE OF ULCERS  *URINARY PROBLEMS  *BOWEL PROBLEMS  UNUSUAL RASH Items with * indicate a potential emergency and should be followed up as soon as possible. If you have an emergency after office hours please contact your primary care physician or go to the nearest emergency department.  Please call the clinic during office hours if you have any questions or concerns.   You may also contact the Patient Navigator at [phone] should you have any questions or need assistance in obtaining follow up care.      Resources For Cancer Patients and their Caregivers ? American Cancer Society: Can assist with transportation, wigs, general needs, runs Look Good Feel Better.        743-092-8567 ? Cancer Care: Provides financial assistance, online support groups, medication/co-pay assistance.  1-800-813-HOPE  4027150317) ? [place] Assists [place] cancer patients and their families through emotional , educational and financial support.  7090695160 ? [place] DSS Where to apply for food stamps, Medicaid and utility assistance. 786-816-5580 ? [facility]: Transportation to medical appointments. 6208194221 ? Social Security Administration: May apply for disability if have a Stage IV cancer. [phone] 502-636-7943 ? [employer], Disability and Transit Services: Assists with nutrition, care and transit needs. 858-169-4223

## 2016-10-07 NOTE — Progress Notes (Signed)
Patient tolerated infusion today without incidence. Patient home infusion pump connected and running on discharge. Patient discharged ambulatory and in stable condition with significant other. Patient to follow up as scheduled.

## 2016-10-09 ENCOUNTER — Encounter (HOSPITAL_BASED_OUTPATIENT_CLINIC_OR_DEPARTMENT_OTHER): Payer: Medicare Other

## 2016-10-09 ENCOUNTER — Encounter (HOSPITAL_COMMUNITY): Payer: Self-pay

## 2016-10-09 VITALS — BP 118/73 | HR 58 | Temp 97.8°F | Resp 18

## 2016-10-09 DIAGNOSIS — Z452 Encounter for adjustment and management of vascular access device: Secondary | ICD-10-CM

## 2016-10-09 DIAGNOSIS — C184 Malignant neoplasm of transverse colon: Secondary | ICD-10-CM

## 2016-10-09 MED ORDER — HEPARIN SOD (PORK) LOCK FLUSH 100 UNIT/ML IV SOLN
500.0000 [IU] | Freq: Once | INTRAVENOUS | Status: AC | PRN
  Administered 2016-10-09: 500 [IU]
  Filled 2016-10-09: qty 5

## 2016-10-09 MED ORDER — SODIUM CHLORIDE 0.9% FLUSH
10.0000 mL | INTRAVENOUS | Status: DC | PRN
  Administered 2016-10-09: 10 mL
  Filled 2016-10-09: qty 10

## 2016-10-09 NOTE — Progress Notes (Signed)
Kevin Kirk presents to have home infusion pump d/c'd and for port-a-cath deaccess with flush. Portacath located left chest wall accessed with  H 20 needle.  Good blood return present. Portacath flushed with NS and 500U/88ml Heparin, and needle removed intact.  Procedure tolerated well and without incident.

## 2016-10-21 ENCOUNTER — Encounter (HOSPITAL_BASED_OUTPATIENT_CLINIC_OR_DEPARTMENT_OTHER): Payer: Medicare Other

## 2016-10-21 ENCOUNTER — Encounter (HOSPITAL_COMMUNITY): Payer: Self-pay | Admitting: Adult Health

## 2016-10-21 ENCOUNTER — Encounter (HOSPITAL_BASED_OUTPATIENT_CLINIC_OR_DEPARTMENT_OTHER): Payer: Medicare Other | Admitting: Adult Health

## 2016-10-21 VITALS — BP 146/70 | HR 68 | Temp 97.8°F | Resp 18 | Wt 204.6 lb

## 2016-10-21 DIAGNOSIS — C184 Malignant neoplasm of transverse colon: Secondary | ICD-10-CM

## 2016-10-21 DIAGNOSIS — K1231 Oral mucositis (ulcerative) due to antineoplastic therapy: Secondary | ICD-10-CM

## 2016-10-21 DIAGNOSIS — Z5111 Encounter for antineoplastic chemotherapy: Secondary | ICD-10-CM | POA: Diagnosis present

## 2016-10-21 DIAGNOSIS — R197 Diarrhea, unspecified: Secondary | ICD-10-CM | POA: Diagnosis not present

## 2016-10-21 LAB — COMPREHENSIVE METABOLIC PANEL
ALT: 21 U/L (ref 17–63)
AST: 29 U/L (ref 15–41)
Albumin: 3.6 g/dL (ref 3.5–5.0)
Alkaline Phosphatase: 47 U/L (ref 38–126)
Anion gap: 8 (ref 5–15)
BUN: 12 mg/dL (ref 6–20)
CO2: 25 mmol/L (ref 22–32)
Calcium: 8.8 mg/dL — ABNORMAL LOW (ref 8.9–10.3)
Chloride: 105 mmol/L (ref 101–111)
Creatinine, Ser: 1.11 mg/dL (ref 0.61–1.24)
GFR calc Af Amer: >60 mL/min (ref >60)
GFR calc non Af Amer: 60 mL/min — ABNORMAL LOW (ref >60)
Glucose, Bld: 131 mg/dL — ABNORMAL HIGH (ref 65–99)
Potassium: 4.2 mmol/L (ref 3.5–5.1)
Sodium: 138 mmol/L (ref 135–145)
Total Bilirubin: 0.7 mg/dL (ref 0.3–1.2)
Total Protein: 6.9 g/dL (ref 6.5–8.1)

## 2016-10-21 LAB — CBC WITH DIFFERENTIAL/PLATELET
Basophils Absolute: 0 10*3/uL (ref 0.0–0.1)
Basophils Relative: 1 %
Eosinophils Absolute: 0.1 10*3/uL (ref 0.0–0.7)
Eosinophils Relative: 1 %
HCT: 36.7 % — ABNORMAL LOW (ref 39.0–52.0)
Hemoglobin: 12.1 g/dL — ABNORMAL LOW (ref 13.0–17.0)
Lymphocytes Relative: 51 %
Lymphs Abs: 3.4 10*3/uL (ref 0.7–4.0)
MCH: 32.1 pg (ref 26.0–34.0)
MCHC: 33 g/dL (ref 30.0–36.0)
MCV: 97.3 fL (ref 78.0–100.0)
Monocytes Absolute: 1.2 10*3/uL — ABNORMAL HIGH (ref 0.1–1.0)
Monocytes Relative: 18 %
Neutro Abs: 1.9 10*3/uL (ref 1.7–7.7)
Neutrophils Relative %: 29 %
Platelets: 155 10*3/uL (ref 150–400)
RBC: 3.77 MIL/uL — ABNORMAL LOW (ref 4.22–5.81)
RDW: 18.8 % — ABNORMAL HIGH (ref 11.5–15.5)
WBC: 6.6 10*3/uL (ref 4.0–10.5)

## 2016-10-21 MED ORDER — OXALIPLATIN CHEMO INJECTION 100 MG/20ML
85.0000 mg/m2 | Freq: Once | INTRAVENOUS | Status: AC
  Administered 2016-10-21: 190 mg via INTRAVENOUS
  Filled 2016-10-21: qty 38

## 2016-10-21 MED ORDER — LEUCOVORIN CALCIUM INJECTION 350 MG
405.0000 mg/m2 | Freq: Once | INTRAVENOUS | Status: AC
  Administered 2016-10-21: 900 mg via INTRAVENOUS
  Filled 2016-10-21: qty 35

## 2016-10-21 MED ORDER — DEXTROSE 5 % IV SOLN
Freq: Once | INTRAVENOUS | Status: AC
  Administered 2016-10-21: 11:00:00 via INTRAVENOUS

## 2016-10-21 MED ORDER — SODIUM CHLORIDE 0.9 % IV SOLN
1800.0000 mg/m2 | INTRAVENOUS | Status: DC
  Administered 2016-10-21: 4000 mg via INTRAVENOUS
  Filled 2016-10-21: qty 80

## 2016-10-21 MED ORDER — SODIUM CHLORIDE 0.9% FLUSH: 10.0000 mL | INTRAVENOUS | Status: DC | PRN

## 2016-10-21 MED ORDER — FLUOROURACIL CHEMO INJECTION 2.5 GM/50ML
300.0000 mg/m2 | Freq: Once | INTRAVENOUS | Status: AC
  Administered 2016-10-21: 650 mg via INTRAVENOUS
  Filled 2016-10-21: qty 13

## 2016-10-21 MED ORDER — DEXAMETHASONE SODIUM PHOSPHATE 10 MG/ML IJ SOLN
10.0000 mg | Freq: Once | INTRAMUSCULAR | Status: AC
  Administered 2016-10-21: 10 mg via INTRAVENOUS
  Filled 2016-10-21: qty 1

## 2016-10-21 MED ORDER — PALONOSETRON HCL INJECTION 0.25 MG/5ML
0.2500 mg | Freq: Once | INTRAVENOUS | Status: AC
  Administered 2016-10-21: 0.25 mg via INTRAVENOUS
  Filled 2016-10-21: qty 5

## 2016-10-21 NOTE — Progress Notes (Signed)
Kevin Kirk Kevin Kirk, [name] [address]   CLINIC:  Medical Oncology/Hematology  PCP:  Kevin Sos, MD [place] [id number] [phone]   REASON FOR VISIT:  Follow-up for Stage IIIB adenocarcinoma of colon   CURRENT THERAPY: FOLFOX, beginning 08/26/16    HISTORY OF PRESENT ILLNESS:  (From Kevin Kirk last note on 09/23/16)  Kevin Kirk 83 y.o. male is here because of referral by Dr. Aviva Kirk. The patient initially noticed bright red blood in his stool in December 2017.   The patient underwent colonoscopy on 06/05/16 with Dr. Laural Kirk which showed a fungating, polypoid and ulcerated non-obstructing large mass was found in the mid transverse colon. The mass was partially circumferential (involving two-thirds of the lumen circumference). The mass measured three cm in length. No bleeding was present. Subsequent biopsy on 06/05/16 revealed adenocarcinoma.  06/20/16: CT abd/pelvis with contrast: Mild focal wall thickening along the proximal/mid transverse colon in the right mid abdomen, possibly corresponding to the abnormality on colonoscopy, equivocal. No findings suspicious for metastatic disease in the abdomen/pelvis.  07/11/16: Partial colectomy by Dr. Arnoldo Kirk. This confirmed adenocarcinoma of the colon, grade 2, spanning 3.8 cm. The tumor invaded through the muscularis propria into the pericolonic soft tissues. Additional lymphovascular invasion was identified. Margins of resection were negative.  Of 16 lymph nodes sampled, one was found positive for metastatic disease. All other lymph nodes negative.  08/04/16: CTA chest: No demonstrable pulmonary embolus. Ascending thoracic aortic diameter is 4.6 x 4.4 cm. No dissection evident. Ascending thoracic aortic aneurysm. Granuloma right base, calcified. Bibasilar atelectasis. Mild calcification along the posterior right apical pleural region without pleural thickening or mass associated. No  adenopathy. Scattered foci of aortic and coronary artery calcification. Status post aortic valve replacement.  Baseline CEA: 2.6    INTERVAL HISTORY:  Kevin Kirk 83 y.o. male returns to cancer center for follow-up for colon cancer.   Here for cycle #5 FOLFOX chemotherapy today.  States that he has felt really weak "with spaghetti legs" for the past 10 days after chemo. Reports excessive diarrhea, but it is difficult for him to quantify how often he is having stools.  Reports that generally he has been having a loose stool every 2 hours or so despite Imodium OTC, sometimes >10 stools/day.  States that he also mixes water and flour together to help with his diarrhea too; "I think that helps better than the Imodium does."  He had sores in his mouth with previous cycles of treatment "but none this past time, just this little spot on my lip."  He is using Magic Mouthwash prn.  Reports some peripheral neuropathy, "especially when I go in the refrigerator, but it's not too bad."  Also has peripheral neuropathy to his feet; again it is difficult it is for him to describe and he states "it just hurts."  He has occasional rash with pruritis, which is improved.    He is walking with a cane today (which is unusual for him he says).  States that he is using it because his legs have felt so weak lately.    For the last 4 days, he tells me that he feels great. He feels ready for his next cycle of chemotherapy.      REVIEW OF SYSTEMS:  Review of Systems  Constitutional: Positive for fatigue. Negative for chills and fever.  HENT:  Negative.  Negative for lump/mass and nosebleeds.   Eyes: Negative.   Respiratory:  Negative.  Negative for cough and shortness of breath.   Cardiovascular: Negative.  Negative for chest pain and leg swelling.  Gastrointestinal: Positive for diarrhea. Negative for abdominal pain, blood in stool, constipation, nausea and vomiting.  Endocrine: Negative.   Genitourinary: Negative.   Negative for dysuria and hematuria.   Musculoskeletal: Negative for arthralgias.  Skin: Positive for itching and rash.  Neurological: Positive for extremity weakness and numbness. Negative for dizziness and headaches.  Hematological: Negative.  Negative for adenopathy. Does not bruise/bleed easily.  Psychiatric/Behavioral: Negative.  Negative for depression and sleep disturbance. The patient is not nervous/anxious.      PAST MEDICAL/SURGICAL HISTORY:  Past Medical History:  Diagnosis Date  . Chronic pulmonary embolism ([place]) 56/4332   compliction of the valve replacement surgery  . COPD (chronic obstructive pulmonary disease) ([place])   . Diabetes ([place]) 04/22/2016  . H/O aortic valve replacement 10/2008  . Hypercholesteremia   . Hypertension   . Pulmonary emboli ([place]) 10/2008  . Sleep apnea    Past Surgical History:  Procedure Laterality Date  . AORTIC VALVE REPLACEMENT     2010  . CARDIAC CATHETERIZATION  2010  . COLONOSCOPY N/A 06/05/2016   Procedure: COLONOSCOPY;  Surgeon: Kevin Houston, MD;  Location: [facility];  Service: Endoscopy;  Laterality: N/A;  . PARTIAL COLECTOMY N/A 07/11/2016   Procedure: PARTIAL COLECTOMY;  Surgeon: Kevin Signs, MD;  Location: [facility] ORS;  Service: General;  Laterality: N/A;  . PORTACATH PLACEMENT N/A 08/13/2016   Procedure: INSERTION PORT-A-CATH LEFT SUBCLAVIAN;  Surgeon: Kevin Signs, MD;  Location: [facility] ORS;  Service: General;  Laterality: N/A;  . REPLACEMENT TOTAL KNEE     1996  . spenectomy     from trauma     SOCIAL HISTORY:  Social History   Social History  . Marital status: Widowed    Spouse name: N/A  . Number of children: N/A  . Years of education: N/A   Occupational History  . Not on file.   Social History Main Topics  . Smoking status: Former Smoker    Years: 15.00    Types: Cigarettes    Quit date: 1964  . Smokeless tobacco: Never Used  . Alcohol use No  . Drug use: No  . Sexual activity: Yes   Other Topics Concern    . Not on file   Social History Narrative  . No narrative on file    FAMILY HISTORY:  Family History  Problem Relation Age of Onset  . Colon cancer Neg Hx     CURRENT MEDICATIONS:  Outpatient Encounter Prescriptions as of 10/21/2016  Medication Sig Note  . acetaminophen (TYLENOL) 500 MG tablet Take 500 mg by mouth daily as needed for moderate pain or headache. 07/03/2016: Seldom   . aspirin EC 81 MG tablet Take 81 mg by mouth daily.   Marland Kitchen atorvastatin (LIPITOR) 10 MG tablet Take 10 mg by mouth at bedtime.    . Cholecalciferol (VITAMIN D3) 5000 units CAPS Take 5,000 Units by mouth daily.    . Diphenhyd-Hydrocort-Nystatin (FIRST-DUKES MOUTHWASH) SUSP Use as directed 5 mLs in the mouth or throat 4 (four) times daily as needed.   . ferrous sulfate 325 (65 FE) MG tablet Take 325 mg by mouth daily with breakfast.   . finasteride (PROSCAR) 5 MG tablet Take 5 mg by mouth at bedtime.    . fluorouracil CALGB 95188 in sodium chloride 0.9 % 150 mL Inject into the vein over 96 hr. Every 2 weeks, infusion  pump for 46 hours   . leucovorin in dextrose 5 % 250 mL Inject into the vein once. Every 2 weeks   . lidocaine-prilocaine (EMLA) cream Apply to affected area once 08/05/2016: New Med   . lisinopril (PRINIVIL,ZESTRIL) 5 MG tablet Take 5 mg by mouth at bedtime.    Marland Kitchen MAGNESIUM PO Take 265 mg by mouth daily.   . metFORMIN (GLUCOPHAGE) 500 MG tablet Take 500 mg by mouth 2 (two) times daily.   . metoprolol (LOPRESSOR) 50 MG tablet Take 50 mg by mouth 2 (two) times daily.   . ondansetron (ZOFRAN) 8 MG tablet Take 1 tablet (8 mg total) by mouth 2 (two) times daily as needed for refractory nausea / vomiting. Start on day 3 after chemotherapy. 08/05/2016: New Med  . OXALIPLATIN IV Inject into the vein. Every 2 weeks   . prochlorperazine (COMPAZINE) 10 MG tablet Take 1 tablet (10 mg total) by mouth every 6 (six) hours as needed (Nausea or vomiting). 08/05/2016: New Med  . vitamin B-12 (CYANOCOBALAMIN) 1000 MCG  tablet Take 1,000 mcg by mouth daily.    . [EXPIRED] fluorouracil (ADRUCIL) chemo injection 900 mg    . [EXPIRED] leucovorin 900 mg in dextrose 5 % 250 mL infusion    . [EXPIRED] oxaliplatin (ELOXATIN) 190 mg in dextrose 5 % 500 mL chemo infusion    . [DISCONTINUED] fluorouracil (ADRUCIL) 5,350 mg in sodium chloride 0.9 % 143 mL chemo infusion     No facility-administered encounter medications on file as of 10/21/2016.     ALLERGIES:  Allergies  Allergen Reactions  . Amoxicillin Hives    Has patient had a PCN reaction causing immediate rash, facial/tongue/throat swelling, SOB or lightheadedness with hypotension: No Has patient had a PCN reaction causing severe rash involving mucus membranes or skin necrosis: Yes Has patient had a PCN reaction that required hospitalization No Has patient had a PCN reaction occurring within the last 10 years: No If all of the above answers are "NO", then may proceed with Cephalosporin use.   . Tamsulosin Hcl     Cant sleep, confusion      PHYSICAL EXAM:  ECOG Performance status: 1-2 - Symptomatic; requires occasional assistance.      Physical Exam  Constitutional: He is oriented to person, place, and time.  Seen in chemo chair in infusion area   HENT:  Head: Normocephalic.  Mouth/Throat: Oropharynx is clear and moist.  Lower lip scabbed lesion  Eyes: Conjunctivae are normal. No scleral icterus.  Neck: Normal range of motion. Neck supple.  Cardiovascular: Normal rate and regular rhythm.   Pulmonary/Chest: Effort normal and breath sounds normal. No respiratory distress.  Abdominal: Soft. Bowel sounds are normal. There is no tenderness.  Musculoskeletal: Normal range of motion. He exhibits edema (Trace ankle edema ).  Lymphadenopathy:    He has no cervical adenopathy.  Neurological: He is alert and oriented to person, place, and time.  Ambulating with cane today   Skin: Skin is warm and dry.  Psychiatric: Mood, memory, affect and judgment  normal.  Nursing note and vitals reviewed.    LABORATORY DATA:  I have reviewed the labs as listed.  CBC    Component Value Date/Time   WBC 6.6 10/21/2016 0914   RBC 3.77 (L) 10/21/2016 0914   HGB 12.1 (L) 10/21/2016 0914   HCT 36.7 (L) 10/21/2016 0914   PLT 155 10/21/2016 0914   MCV 97.3 10/21/2016 0914   MCH 32.1 10/21/2016 0914   MCHC 33.0 10/21/2016  0914   RDW 18.8 (H) 10/21/2016 0914   LYMPHSABS 3.4 10/21/2016 0914   MONOABS 1.2 (H) 10/21/2016 0914   EOSABS 0.1 10/21/2016 0914   BASOSABS 0.0 10/21/2016 0914   CMP Latest Ref Rng & Units 10/21/2016 10/07/2016 09/23/2016  Glucose 65 - 99 mg/dL 131(H) 147(H) 145(H)  BUN 6 - 20 mg/dL 12 11 9   Creatinine 0.61 - 1.24 mg/dL 1.11 0.91 0.81  Sodium 135 - 145 mmol/L 138 135 135  Potassium 3.5 - 5.1 mmol/L 4.2 4.1 4.0  Chloride 101 - 111 mmol/L 105 99(L) 99(L)  CO2 22 - 32 mmol/L 25 28 28   Calcium 8.9 - 10.3 mg/dL 8.8(L) 9.2 9.2  Total Protein 6.5 - 8.1 g/dL 6.9 7.1 7.0  Total Bilirubin 0.3 - 1.2 mg/dL 0.7 0.9 0.9  Alkaline Phos 38 - 126 U/L 47 46 44  AST 15 - 41 U/L 29 27 27   ALT 17 - 63 U/L 21 22 25     PENDING LABS:    DIAGNOSTIC IMAGING:  *The following radiologic images and reports have been reviewed independently and agree with below findings.  CT abd/pelvis: 07/14/16 CLINICAL DATA:  Post RIGHT hemicolectomy on 07/11/2016 for colon cancer, vomiting  EXAM: CT ABDOMEN AND PELVIS WITH CONTRAST  TECHNIQUE: Multidetector CT imaging of the abdomen and pelvis was performed using the standard protocol following bolus administration of intravenous contrast. Sagittal and coronal MPR images reconstructed from axial data set.  CONTRAST:  51mL ISOVUE-300 IOPAMIDOL (ISOVUE-300) INJECTION 61% PO, 173mL ISOVUE-300 IOPAMIDOL (ISOVUE-300) INJECTION 61% IV  COMPARISON:  06/20/2016  FINDINGS: Lower chest: Bibasilar atelectasis  Hepatobiliary: Few beam hardening artifacts from ribs traverse liver. Suspected tiny dependent  gallstones. No definite biliary dilatation.  Pancreas: Normal appearance  Spleen: Surgically absent. Probable small splenule LEFT upper quadrant 19 x 13 mm image 21.  Adrenals/Urinary Tract: Adrenal glands, kidneys, ureters, and bladder normal appearance  Stomach/Bowel: Ileocolic anastomosis at the proximal to mid transverse colon. Diffuse dilatation of small bowel loops and stomach likely reflecting ileus. No evidence of gastric outlet obstruction. Mild bowel wall thickening of the distal ileum at the anastomosis consistent with recent surgery. Sigmoid diverticulosis. Remaining residual colon unremarkable.  Vascular/Lymphatic: Scattered pelvic phleboliths. Scattered atherosclerotic calcifications without aneurysm. IVC filter noted. No definite adenopathy.  Reproductive: Enlarged prostate gland 6.7 x 5.5 x 5.8 cm. Seminal vesicles unremarkable.  Other: Low-attenuation free primarily in the RIGHT gutter and perihepatic region though a small amount is also present in the pelvis dependently and interloop. Free intraperitoneal air greatest in RIGHT upper quadrant. These are likely related to recent surgery. No hernia.  Musculoskeletal: Osteoarthritic changes of the RIGHT hip joint. Scattered degenerative disc and facet disease changes lower lumbar spine.  IMPRESSION: Dilated stomach and small bowel loops question postoperative ileus.  Free air and free fluid likely related to recent surgery.  Distal colonic diverticulosis.  Bibasilar atelectasis.  Prostatic enlargement.   Electronically Signed   By: Lavonia Dana M.D.   On: 07/14/2016 11:24     PATHOLOGY:  Colon resection surgical path: 07/11/16      ASSESSMENT & PLAN:   Stage IIIB adenocarcinoma of colon:  -Diagnosed in 05/2016 during colonoscopy with Dr. Laural Kirk. Baseline CEA normal at 2.6.  CT abd/pelvis did not reveal any evidence of metastatic disease.  Colon surgical resection performed by  Dr. Arnoldo Kirk on 07/11/16. Started chemotherapy with FOLFOX on 08/26/16.  -Due for cycle #5 FOLFOX today.  Discussed with Dr. Talbert Cage. Given progressive fatigue and excessive diarrhea, will dose-reduce 5-FU  by 25% (both the bolus and continuous doses).  -Continue chemo every 2 weeks as scheduled for total of 12 cycles.  -Return to cancer center in 1 month for follow-up with treatment.   Diarrhea:  -States that he is using Imodium OTC regularly several times per day. Is also using flour and water, "which helps better than the Imodium."  -Offered prescription for Lomotil; patient declines at this time and wants to "try one more round without it."  Recommended continued oral intake with electrolytes (like Gatorade/Powerade) as tolerated.   Mucositis:  -Due to chemotherapy.  -Grade 1 to lower lip. No buccal lesions appreciated. -Continue Magic Mouthwash as tolerated. Recommended salt water/baking soda rinses as well.      Dispo:  -Continue FOLFOX chemo every 2 weeks.  -Return to cancer center for follow-up in 1 month.    All questions were answered to patient's stated satisfaction. Encouraged patient to call with any new concerns or questions before his next visit to the cancer center and we can certain see him sooner, if needed.    Plan of care discussed with Dr. Talbert Cage, who agrees with the above aforementioned.    Orders placed this encounter:  No orders of the defined types were placed in this encounter.     Mike Craze, NP [place] [phone]

## 2016-10-21 NOTE — Patient Instructions (Addendum)
[facility] at [facility] Discharge Instructions  RECOMMENDATIONS MADE BY THE CONSULTANT AND ANY TEST RESULTS WILL BE SENT TO YOUR REFERRING PHYSICIAN.  You were seen today by Gretchen Dawson NP.   Thank you for choosing [facility] at [facility] to provide your oncology and hematology care.  To afford each patient quality time with our provider, please arrive at least 15 minutes before your scheduled appointment time.    If you have a lab appointment with the Cancer Center please come in thru the  Main Entrance and check in at the main information desk  You need to re-schedule your appointment should you arrive 10 or more minutes late.  We strive to give you quality time with our providers, and arriving late affects you and other patients whose appointments are after yours.  Also, if you no show three or more times for appointments you may be dismissed from the clinic at the providers discretion.     Again, thank you for choosing [facility].  Our hope is that these requests will decrease the amount of time that you wait before being seen by our physicians.       _____________________________________________________________  Should you have questions after your visit to [facility], please contact our office at [phone] between the hours of 8:30 a.m. and 4:30 p.m.  Voicemails left after 4:30 p.m. will not be returned until the following business day.  For prescription refill requests, have your pharmacy contact our office.       Resources For Cancer Patients and their Caregivers ? American Cancer Society: Can assist with transportation, wigs, general needs, runs Look Good Feel Better.        1-888-227-6333 ? Cancer Care: Provides financial assistance, online support groups, medication/co-pay assistance.  1-800-813-HOPE (4673) ? [facility] Assists [place] cancer patients and  their families through emotional , educational and financial support.  336-427-4357 ? [place] DSS Where to apply for food stamps, Medicaid and utility assistance. 336-342-1394 ? [facility]: Transportation to medical appointments. 336-347-2287 ? Social Security Administration: May apply for disability if have a Stage IV cancer. 336-342-7796 1-800-772-1213 ? [place] Aging, Disability and Transit Services: Assists with nutrition, care and transit needs. 336-349-2343  Cancer Center Support Programs: [email or web address]@ > Cancer Support Group  2nd Tuesday of the month 1pm-2pm, Journey Room  > Creative Journey  3rd Tuesday of the month 1130am-1pm, Journey Room  > Look Good Feel Better  1st Wednesday of the month 10am-12 noon, Journey Room (Call American Cancer Society to register 1-800-395-5775)    

## 2016-10-21 NOTE — Progress Notes (Signed)
Tolerated infusions w/o adverse reaction.  Alert, in no distress.  VSS.  Discharged ambulatory in c/o friend.  

## 2016-10-23 ENCOUNTER — Encounter (HOSPITAL_COMMUNITY): Payer: Medicare Other | Attending: Oncology

## 2016-10-23 ENCOUNTER — Encounter (HOSPITAL_COMMUNITY): Payer: Self-pay

## 2016-10-23 VITALS — BP 148/80 | HR 70 | Temp 97.9°F | Resp 18

## 2016-10-23 DIAGNOSIS — C184 Malignant neoplasm of transverse colon: Secondary | ICD-10-CM

## 2016-10-23 DIAGNOSIS — D509 Iron deficiency anemia, unspecified: Secondary | ICD-10-CM | POA: Insufficient documentation

## 2016-10-23 MED ORDER — HEPARIN SOD (PORK) LOCK FLUSH 100 UNIT/ML IV SOLN
500.0000 [IU] | Freq: Once | INTRAVENOUS | Status: AC | PRN
  Administered 2016-10-23: 500 [IU]

## 2016-10-23 MED ORDER — SODIUM CHLORIDE 0.9% FLUSH
10.0000 mL | INTRAVENOUS | Status: DC | PRN
  Administered 2016-10-23: 10 mL
  Filled 2016-10-23: qty 10

## 2016-10-23 NOTE — Progress Notes (Signed)
Kevin Kirk presents to have home infusion pump d/c'd and for port-a-cath deaccess with flush.  Portacath located left chest wall accessed with  H 20 needle.  Good blood return present. Portacath flushed with NS and 500U/5ml Heparin, and needle removed intact.  Procedure tolerated well and without incident.  Discharged ambulatory.  

## 2016-11-03 ENCOUNTER — Other Ambulatory Visit (HOSPITAL_COMMUNITY): Payer: Self-pay | Admitting: Oncology

## 2016-11-04 ENCOUNTER — Encounter (HOSPITAL_BASED_OUTPATIENT_CLINIC_OR_DEPARTMENT_OTHER): Payer: Medicare Other

## 2016-11-04 ENCOUNTER — Encounter (HOSPITAL_COMMUNITY): Payer: Self-pay

## 2016-11-04 VITALS — BP 139/78 | HR 82 | Temp 97.6°F | Resp 18 | Wt 202.8 lb

## 2016-11-04 DIAGNOSIS — Z5111 Encounter for antineoplastic chemotherapy: Secondary | ICD-10-CM | POA: Diagnosis present

## 2016-11-04 DIAGNOSIS — C184 Malignant neoplasm of transverse colon: Secondary | ICD-10-CM

## 2016-11-04 DIAGNOSIS — T451X5A Adverse effect of antineoplastic and immunosuppressive drugs, initial encounter: Secondary | ICD-10-CM

## 2016-11-04 DIAGNOSIS — D509 Iron deficiency anemia, unspecified: Secondary | ICD-10-CM | POA: Diagnosis present

## 2016-11-04 DIAGNOSIS — K521 Toxic gastroenteritis and colitis: Secondary | ICD-10-CM

## 2016-11-04 LAB — COMPREHENSIVE METABOLIC PANEL
ALT: 24 U/L (ref 17–63)
AST: 31 U/L (ref 15–41)
Albumin: 3.5 g/dL (ref 3.5–5.0)
Alkaline Phosphatase: 45 U/L (ref 38–126)
Anion gap: 8 (ref 5–15)
BUN: 8 mg/dL (ref 6–20)
CO2: 28 mmol/L (ref 22–32)
Calcium: 8.9 mg/dL (ref 8.9–10.3)
Chloride: 102 mmol/L (ref 101–111)
Creatinine, Ser: 0.91 mg/dL (ref 0.61–1.24)
GFR calc Af Amer: >60 mL/min (ref >60)
GFR calc non Af Amer: >60 mL/min (ref >60)
Glucose, Bld: 171 mg/dL — ABNORMAL HIGH (ref 65–99)
Potassium: 4.3 mmol/L (ref 3.5–5.1)
Sodium: 138 mmol/L (ref 135–145)
Total Bilirubin: 0.7 mg/dL (ref 0.3–1.2)
Total Protein: 6.8 g/dL (ref 6.5–8.1)

## 2016-11-04 LAB — CBC WITH DIFFERENTIAL/PLATELET
Basophils Absolute: 0.1 10*3/uL (ref 0.0–0.1)
Basophils Relative: 1 %
Eosinophils Absolute: 0.1 10*3/uL (ref 0.0–0.7)
Eosinophils Relative: 1 %
HCT: 36.6 % — ABNORMAL LOW (ref 39.0–52.0)
Hemoglobin: 12.1 g/dL — ABNORMAL LOW (ref 13.0–17.0)
Lymphocytes Relative: 38 %
Lymphs Abs: 2.4 10*3/uL (ref 0.7–4.0)
MCH: 32.8 pg (ref 26.0–34.0)
MCHC: 33.1 g/dL (ref 30.0–36.0)
MCV: 99.2 fL (ref 78.0–100.0)
Monocytes Absolute: 1.3 10*3/uL — ABNORMAL HIGH (ref 0.1–1.0)
Monocytes Relative: 21 %
Neutro Abs: 2.4 10*3/uL (ref 1.7–7.7)
Neutrophils Relative %: 39 %
Platelets: 146 10*3/uL — ABNORMAL LOW (ref 150–400)
RBC: 3.69 MIL/uL — ABNORMAL LOW (ref 4.22–5.81)
RDW: 19 % — ABNORMAL HIGH (ref 11.5–15.5)
WBC: 6.2 10*3/uL (ref 4.0–10.5)

## 2016-11-04 MED ORDER — LEUCOVORIN CALCIUM INJECTION 350 MG
413.0000 mg/m2 | Freq: Once | INTRAVENOUS | Status: AC
  Administered 2016-11-04: 900 mg via INTRAVENOUS
  Filled 2016-11-04: qty 10

## 2016-11-04 MED ORDER — SODIUM CHLORIDE 0.9 % IV SOLN: 2400.0000 mg/m2 | INTRAVENOUS | Status: DC

## 2016-11-04 MED ORDER — DEXTROSE 5 % IV SOLN
Freq: Once | INTRAVENOUS | Status: AC
  Administered 2016-11-04: 10:00:00 via INTRAVENOUS

## 2016-11-04 MED ORDER — FLUOROURACIL CHEMO INJECTION 2.5 GM/50ML
300.0000 mg/m2 | Freq: Once | INTRAVENOUS | Status: AC
  Administered 2016-11-04: 650 mg via INTRAVENOUS
  Filled 2016-11-04: qty 13

## 2016-11-04 MED ORDER — FLUOROURACIL CHEMO INJECTION 2.5 GM/50ML: 400.0000 mg/m2 | Freq: Once | INTRAVENOUS | Status: DC

## 2016-11-04 MED ORDER — DIPHENOXYLATE-ATROPINE 2.5-0.025 MG PO TABS: 1.0000 | ORAL_TABLET | Freq: Four times a day (QID) | ORAL | 0 refills | Status: DC | PRN

## 2016-11-04 MED ORDER — PALONOSETRON HCL INJECTION 0.25 MG/5ML
INTRAVENOUS | Status: AC
  Filled 2016-11-04: qty 5

## 2016-11-04 MED ORDER — PALONOSETRON HCL INJECTION 0.25 MG/5ML
0.2500 mg | Freq: Once | INTRAVENOUS | Status: AC
  Administered 2016-11-04: 0.25 mg via INTRAVENOUS

## 2016-11-04 MED ORDER — DEXAMETHASONE SODIUM PHOSPHATE 10 MG/ML IJ SOLN
10.0000 mg | Freq: Once | INTRAMUSCULAR | Status: AC
  Administered 2016-11-04: 10 mg via INTRAVENOUS

## 2016-11-04 MED ORDER — OXALIPLATIN CHEMO INJECTION 100 MG/20ML
68.0000 mg/m2 | Freq: Once | INTRAVENOUS | Status: AC
  Administered 2016-11-04: 150 mg via INTRAVENOUS
  Filled 2016-11-04: qty 10

## 2016-11-04 MED ORDER — SODIUM CHLORIDE 0.9 % IV SOLN
1800.0000 mg/m2 | INTRAVENOUS | Status: AC
  Administered 2016-11-04: 3900 mg via INTRAVENOUS
  Filled 2016-11-04: qty 78

## 2016-11-04 MED ORDER — DEXAMETHASONE SODIUM PHOSPHATE 10 MG/ML IJ SOLN
INTRAMUSCULAR | Status: AC
  Filled 2016-11-04: qty 1

## 2016-11-04 NOTE — Progress Notes (Unsigned)
Pt reports mid top lip is numb, reports his cold sensitivity will lessen over time, but does not go away completely now; states the bottom of his feet "feel like walking on marbles in bare feet".  Discussed with Fortunato Curling, NP - will dose reduce oxaliplatin.  Pt also reports diarrhea that started last week 2 days after coming off pump, lasting for 4 days.  Stated he took his imodium as directed.  This was also discussed with Fortunato Curling, NP.  Rx for Lomotil faxed to pt's pharmacy.

## 2016-11-06 ENCOUNTER — Encounter (HOSPITAL_COMMUNITY): Payer: Medicare Other | Attending: Adult Health

## 2016-11-06 ENCOUNTER — Encounter (HOSPITAL_COMMUNITY): Payer: Self-pay

## 2016-11-06 VITALS — BP 135/76 | HR 78 | Temp 97.8°F | Resp 18

## 2016-11-06 DIAGNOSIS — C184 Malignant neoplasm of transverse colon: Secondary | ICD-10-CM

## 2016-11-06 MED ORDER — SODIUM CHLORIDE 0.9% FLUSH
10.0000 mL | INTRAVENOUS | Status: DC | PRN
  Administered 2016-11-06: 10 mL
  Filled 2016-11-06: qty 10

## 2016-11-06 MED ORDER — HEPARIN SOD (PORK) LOCK FLUSH 100 UNIT/ML IV SOLN
500.0000 [IU] | Freq: Once | INTRAVENOUS | Status: AC | PRN
  Administered 2016-11-06: 500 [IU]

## 2016-11-06 NOTE — Progress Notes (Signed)
Kevin Kirk presents to have home infusion pump d/c'd and for port-a-cath deaccess with flush.  Proper placement of portacath confirmed by CXR.  Portacath located left chest wall accessed with  H 20 needle.  Good blood return present. Portacath flushed with NS and 500U/25ml Heparin, and needle removed intact.  Procedure tolerated well and without incident.  Discharged ambulatory.

## 2016-11-13 ENCOUNTER — Other Ambulatory Visit (HOSPITAL_COMMUNITY): Payer: Self-pay | Admitting: Oncology

## 2016-11-18 ENCOUNTER — Encounter (HOSPITAL_BASED_OUTPATIENT_CLINIC_OR_DEPARTMENT_OTHER): Payer: Medicare Other

## 2016-11-18 ENCOUNTER — Encounter (HOSPITAL_BASED_OUTPATIENT_CLINIC_OR_DEPARTMENT_OTHER): Payer: Medicare Other | Admitting: Adult Health

## 2016-11-18 ENCOUNTER — Encounter (HOSPITAL_COMMUNITY): Payer: Self-pay

## 2016-11-18 VITALS — BP 123/62 | HR 70 | Temp 97.4°F | Resp 16 | Wt 205.2 lb

## 2016-11-18 DIAGNOSIS — Z5111 Encounter for antineoplastic chemotherapy: Secondary | ICD-10-CM

## 2016-11-18 DIAGNOSIS — C184 Malignant neoplasm of transverse colon: Secondary | ICD-10-CM | POA: Diagnosis present

## 2016-11-18 DIAGNOSIS — R197 Diarrhea, unspecified: Secondary | ICD-10-CM

## 2016-11-18 LAB — CBC WITH DIFFERENTIAL/PLATELET
Basophils Absolute: 0 10*3/uL (ref 0.0–0.1)
Basophils Relative: 0 %
Eosinophils Absolute: 0.1 10*3/uL (ref 0.0–0.7)
Eosinophils Relative: 2 %
HCT: 34.9 % — ABNORMAL LOW (ref 39.0–52.0)
Hemoglobin: 11.5 g/dL — ABNORMAL LOW (ref 13.0–17.0)
Lymphocytes Relative: 33 %
Lymphs Abs: 2.5 10*3/uL (ref 0.7–4.0)
MCH: 33.7 pg (ref 26.0–34.0)
MCHC: 33 g/dL (ref 30.0–36.0)
MCV: 102.3 fL — ABNORMAL HIGH (ref 78.0–100.0)
Monocytes Absolute: 1.5 10*3/uL — ABNORMAL HIGH (ref 0.1–1.0)
Monocytes Relative: 20 %
Neutro Abs: 3.2 10*3/uL (ref 1.7–7.7)
Neutrophils Relative %: 45 %
Platelets: 154 10*3/uL (ref 150–400)
RBC: 3.41 MIL/uL — ABNORMAL LOW (ref 4.22–5.81)
RDW: 18.8 % — ABNORMAL HIGH (ref 11.5–15.5)
WBC: 7.3 10*3/uL (ref 4.0–10.5)

## 2016-11-18 LAB — COMPREHENSIVE METABOLIC PANEL
ALT: 23 U/L (ref 17–63)
AST: 32 U/L (ref 15–41)
Albumin: 3.5 g/dL (ref 3.5–5.0)
Alkaline Phosphatase: 41 U/L (ref 38–126)
Anion gap: 9 (ref 5–15)
BUN: 10 mg/dL (ref 6–20)
CO2: 26 mmol/L (ref 22–32)
Calcium: 9.1 mg/dL (ref 8.9–10.3)
Chloride: 102 mmol/L (ref 101–111)
Creatinine, Ser: 0.87 mg/dL (ref 0.61–1.24)
GFR calc Af Amer: >60 mL/min (ref >60)
GFR calc non Af Amer: >60 mL/min (ref >60)
Glucose, Bld: 201 mg/dL — ABNORMAL HIGH (ref 65–99)
Potassium: 4.1 mmol/L (ref 3.5–5.1)
Sodium: 137 mmol/L (ref 135–145)
Total Bilirubin: 1 mg/dL (ref 0.3–1.2)
Total Protein: 6.6 g/dL (ref 6.5–8.1)

## 2016-11-18 MED ORDER — OXALIPLATIN CHEMO INJECTION 100 MG/20ML
68.0000 mg/m2 | Freq: Once | INTRAVENOUS | Status: AC
  Administered 2016-11-18: 150 mg via INTRAVENOUS
  Filled 2016-11-18: qty 20

## 2016-11-18 MED ORDER — LEUCOVORIN CALCIUM INJECTION 350 MG
413.0000 mg/m2 | Freq: Once | INTRAVENOUS | Status: AC
  Administered 2016-11-18: 900 mg via INTRAVENOUS
  Filled 2016-11-18: qty 35

## 2016-11-18 MED ORDER — DEXAMETHASONE SODIUM PHOSPHATE 10 MG/ML IJ SOLN
10.0000 mg | Freq: Once | INTRAMUSCULAR | Status: AC
  Administered 2016-11-18: 10 mg via INTRAVENOUS
  Filled 2016-11-18: qty 1

## 2016-11-18 MED ORDER — PALONOSETRON HCL INJECTION 0.25 MG/5ML
0.2500 mg | Freq: Once | INTRAVENOUS | Status: AC
  Administered 2016-11-18: 0.25 mg via INTRAVENOUS
  Filled 2016-11-18: qty 5

## 2016-11-18 MED ORDER — FLUOROURACIL CHEMO INJECTION 2.5 GM/50ML
300.0000 mg/m2 | Freq: Once | INTRAVENOUS | Status: AC
  Administered 2016-11-18: 650 mg via INTRAVENOUS
  Filled 2016-11-18: qty 13

## 2016-11-18 MED ORDER — DEXTROSE 5 % IV SOLN
Freq: Once | INTRAVENOUS | Status: AC
  Administered 2016-11-18: 10:00:00 via INTRAVENOUS

## 2016-11-18 MED ORDER — SODIUM CHLORIDE 0.9 % IV SOLN
1800.0000 mg/m2 | INTRAVENOUS | Status: DC
  Administered 2016-11-18: 3900 mg via INTRAVENOUS
  Filled 2016-11-18: qty 78

## 2016-11-18 MED ORDER — SODIUM CHLORIDE 0.9% FLUSH
10.0000 mL | INTRAVENOUS | Status: DC | PRN
  Administered 2016-11-18: 10 mL
  Filled 2016-11-18: qty 10

## 2016-11-18 NOTE — Patient Instructions (Signed)
[facility] Discharge Instructions for Patients Receiving Chemotherapy   Beginning January 23rd 2017 lab work for the Cancer Center will be done in the  Main lab at  on 1st floor. If you have a lab appointment with the Cancer Center please come in thru the  Main Entrance and check in at the main information desk   Today you received the following chemotherapy agents   To help prevent nausea and vomiting after your treatment, we encourage you to take your nausea medication     If you develop nausea and vomiting, or diarrhea that is not controlled by your medication, call the clinic.  The clinic phone number is [phone]. Office hours are Monday-Friday 8:30am-5:00pm.  BELOW ARE SYMPTOMS THAT SHOULD BE REPORTED IMMEDIATELY:  *FEVER GREATER THAN 101.0 F  *CHILLS WITH OR WITHOUT FEVER  NAUSEA AND VOMITING THAT IS NOT CONTROLLED WITH YOUR NAUSEA MEDICATION  *UNUSUAL SHORTNESS OF BREATH  *UNUSUAL BRUISING OR BLEEDING  TENDERNESS IN MOUTH AND THROAT WITH OR WITHOUT PRESENCE OF ULCERS  *URINARY PROBLEMS  *BOWEL PROBLEMS  UNUSUAL RASH Items with * indicate a potential emergency and should be followed up as soon as possible. If you have an emergency after office hours please contact your primary care physician or go to the nearest emergency department.  Please call the clinic during office hours if you have any questions or concerns.   You may also contact the Patient Navigator at [phone] should you have any questions or need assistance in obtaining follow up care.      Resources For Cancer Patients and their Caregivers ? American Cancer Society: Can assist with transportation, wigs, general needs, runs Look Good Feel Better.        1-888-227-6333 ? Cancer Care: Provides financial assistance, online support groups, medication/co-pay assistance.  1-800-813-HOPE (4673) ? [facility] Assists [place] cancer  patients and their families through emotional , educational and financial support.  336-427-4357 ? [place] DSS Where to apply for food stamps, Medicaid and utility assistance. 336-342-1394 ? [facility]: Transportation to medical appointments. 336-347-2287 ? Social Security Administration: May apply for disability if have a Stage IV cancer. 336-342-7796 1-800-772-1213 ? [place] Aging, Disability and Transit Services: Assists with nutrition, care and transit needs. 336-349-2343         

## 2016-11-18 NOTE — Progress Notes (Signed)
Labs reviewed with Jarold Song NP. Proceed with treatment.  Treatment given today per orders. Patient tolerated it well, no problems. Vitals stable. Continuous 5FU pump connected, patient understood when to return on Thursday. Patient discharged ambulatory from clinic, follow up as scheduled.

## 2016-11-18 NOTE — Progress Notes (Signed)
[place], [place] [address]   CLINIC:  Medical Oncology/Hematology  PCP:  Kevin Sos, MD [place] [address] [phone]   REASON FOR VISIT:  Follow-up for Stage IIIB adenocarcinoma of colon   CURRENT THERAPY: FOLFOX, beginning 08/26/16    HISTORY OF PRESENT ILLNESS:  (From Dr. Laverle Kirk  note on 09/23/16)  Kevin Kirk 83 y.o. male is here because of referral by Dr. Aviva Kirk. The patient initially noticed bright red blood in his stool in December 2017.   The patient underwent colonoscopy on 06/05/16 with Dr. Laural Kirk which showed a fungating, polypoid and ulcerated non-obstructing large mass was found in the mid transverse colon. The mass was partially circumferential (involving two-thirds of the lumen circumference). The mass measured three cm in length. No bleeding was present. Subsequent biopsy on 06/05/16 revealed adenocarcinoma.  06/20/16: CT abd/pelvis with contrast: Mild focal wall thickening along the proximal/mid transverse colon in the right mid abdomen, possibly corresponding to the abnormality on colonoscopy, equivocal. No findings suspicious for metastatic disease in the abdomen/pelvis.  07/11/16: Partial colectomy by Dr. Arnoldo Kirk. This confirmed adenocarcinoma of the colon, grade 2, spanning 3.8 cm. The tumor invaded through the muscularis propria into the pericolonic soft tissues. Additional lymphovascular invasion was identified. Margins of resection were negative.  Of 16 lymph nodes sampled, one was found positive for metastatic disease. All other lymph nodes negative.  08/04/16: CTA chest: No demonstrable pulmonary embolus. Ascending thoracic aortic diameter is 4.6 x 4.4 cm. No dissection evident. Ascending thoracic aortic aneurysm. Granuloma right base, calcified. Bibasilar atelectasis. Mild calcification along the posterior right apical pleural region without pleural thickening or mass associated. No adenopathy.  Scattered foci of aortic and coronary artery calcification. Status post aortic valve replacement.  Baseline CEA: 2.6    INTERVAL HISTORY:  Kevin Kirk 83 y.o. male returns to cancer center for follow-up for colon cancer.   Due for cycle #7 FOLFOX chemotherapy today.   Overall, he has been feeling "pretty good." Appetite 100%; energy levels 75%.  He has some neck and back pain, which pre-dates his cancer diagnosis. He is wondering if it would be okay for him to have a massage.  I gave him a paper prescription today stating that it was safe for him to have massage; recommended he avoid deep tissue given potential for easy bruising with chemotherapy.   His diarrhea is improved; he has Imodium and Lomotil that he takes as needed. Continues to have some burning to the bottom of his feet, "but it doesn't feel like I'm walking on marbles anymore. The burning is back to like it was before chemo."  He lips occasionally get numb; denies any shortness of breath, cough, rash, or other allergic reaction symptoms.  Endorses dysgeusia; food has metallic taste.   Continues to walk with a cane, but only as needed.      REVIEW OF SYSTEMS:  Review of Systems  Constitutional: Positive for fatigue. Negative for chills and fever.  HENT:  Negative.  Negative for mouth sores.   Eyes: Negative.   Respiratory: Negative.  Negative for cough and shortness of breath.   Cardiovascular: Negative.   Gastrointestinal: Positive for diarrhea.  Endocrine: Negative.   Genitourinary: Negative.  Negative for dysuria and hematuria.   Musculoskeletal: Positive for back pain and neck pain.  Neurological:       Burning to bottom of feet  Hematological: Negative.   Psychiatric/Behavioral: Negative.  PAST MEDICAL/SURGICAL HISTORY:  Past Medical History:  Diagnosis Date  . Chronic pulmonary embolism ([facility]) 17/4081   compliction of the valve replacement surgery  . COPD (chronic obstructive pulmonary disease) ([facility])     . Diabetes ([facility]) 04/22/2016  . H/O aortic valve replacement 10/2008  . Hypercholesteremia   . Hypertension   . Pulmonary emboli ([facility]) 10/2008  . Sleep apnea    Past Surgical History:  Procedure Laterality Date  . AORTIC VALVE REPLACEMENT     2010  . CARDIAC CATHETERIZATION  2010  . COLONOSCOPY N/A 06/05/2016   Procedure: COLONOSCOPY;  Surgeon: Kevin Houston, MD;  Location: [facility];  Service: Endoscopy;  Laterality: N/A;  . PARTIAL COLECTOMY N/A 07/11/2016   Procedure: PARTIAL COLECTOMY;  Surgeon: Kevin Signs, MD;  Location: [facility] ORS;  Service: General;  Laterality: N/A;  . PORTACATH PLACEMENT N/A 08/13/2016   Procedure: INSERTION PORT-A-CATH LEFT SUBCLAVIAN;  Surgeon: Kevin Signs, MD;  Location: [facility] ORS;  Service: General;  Laterality: N/A;  . REPLACEMENT TOTAL KNEE     1996  . spenectomy     from trauma     SOCIAL HISTORY:  Social History   Social History  . Marital status: Widowed    Spouse name: N/A  . Number of children: N/A  . Years of education: N/A   Occupational History  . Not on file.   Social History Main Topics  . Smoking status: Former Smoker    Years: 15.00    Types: Cigarettes    Quit date: 1964  . Smokeless tobacco: Never Used  . Alcohol use No  . Drug use: No  . Sexual activity: Yes   Other Topics Concern  . Not on file   Social History Narrative  . No narrative on file    FAMILY HISTORY:  Family History  Problem Relation Age of Onset  . Colon cancer Neg Hx     CURRENT MEDICATIONS:  Outpatient Encounter Prescriptions as of 11/18/2016  Medication Sig Note  . acetaminophen (TYLENOL) 500 MG tablet Take 500 mg by mouth daily as needed for moderate pain or headache. 07/03/2016: Seldom   . aspirin EC 81 MG tablet Take 81 mg by mouth daily.   Marland Kitchen atorvastatin (LIPITOR) 10 MG tablet Take 10 mg by mouth at bedtime.    . Cholecalciferol (VITAMIN D3) 5000 units CAPS Take 5,000 Units by mouth daily.    . Diphenhyd-Hydrocort-Nystatin  (FIRST-DUKES MOUTHWASH) SUSP Use as directed 5 mLs in the mouth or throat 4 (four) times daily as needed.   . diphenoxylate-atropine (LOMOTIL) 2.5-0.025 MG tablet Take 1 tablet by mouth 4 (four) times daily as needed for diarrhea or loose stools.   . ferrous sulfate 325 (65 FE) MG tablet Take 325 mg by mouth daily with breakfast.   . finasteride (PROSCAR) 5 MG tablet Take 5 mg by mouth at bedtime.    . fluorouracil CALGB 44818 in sodium chloride 0.9 % 150 mL Inject into the vein over 96 hr. Every 2 weeks, infusion pump for 46 hours   . leucovorin in dextrose 5 % 250 mL Inject into the vein once. Every 2 weeks   . lidocaine-prilocaine (EMLA) cream Apply to affected area once 08/05/2016: New [facility]   . lisinopril (PRINIVIL,ZESTRIL) 5 MG tablet Take 5 mg by mouth at bedtime.    Marland Kitchen MAGNESIUM PO Take 265 mg by mouth daily.   . metFORMIN (GLUCOPHAGE) 500 MG tablet Take 500 mg by mouth 2 (two) times daily.   Marland Kitchen  metoprolol (LOPRESSOR) 50 MG tablet Take 50 mg by mouth 2 (two) times daily.   . ondansetron (ZOFRAN) 8 MG tablet Take 1 tablet (8 mg total) by mouth 2 (two) times daily as needed for refractory nausea / vomiting. Start on day 3 after chemotherapy. 08/05/2016: New Med  . OXALIPLATIN IV Inject into the vein. Every 2 weeks   . prochlorperazine (COMPAZINE) 10 MG tablet Take 1 tablet (10 mg total) by mouth every 6 (six) hours as needed (Nausea or vomiting). 08/05/2016: New Med  . vitamin B-12 (CYANOCOBALAMIN) 1000 MCG tablet Take 1,000 mcg by mouth daily.     No facility-administered encounter medications on file as of 11/18/2016.     ALLERGIES:  Allergies  Allergen Reactions  . Amoxicillin Hives    Has patient had a PCN reaction causing immediate rash, facial/tongue/throat swelling, SOB or lightheadedness with hypotension: No Has patient had a PCN reaction causing severe rash involving mucus membranes or skin necrosis: Yes Has patient had a PCN reaction that required hospitalization No Has patient had  a PCN reaction occurring within the last 10 years: No If all of the above answers are "NO", then may proceed with Cephalosporin use.   . Tamsulosin Hcl     Cant sleep, confusion      PHYSICAL EXAM:  ECOG Performance status: 1-2 - Symptomatic; requires occasional assistance.      Physical Exam  Constitutional: He is oriented to person, place, and time and well-developed, well-nourished, and in no distress.  Seen in chemo chair in infusion area   HENT:  Head: Normocephalic.  Mouth/Throat: Oropharynx is clear and moist.  Eyes: Conjunctivae are normal. No scleral icterus.  Neck: Normal range of motion. Neck supple.  Cardiovascular: Normal rate and regular rhythm.   Pulmonary/Chest: Effort normal. No respiratory distress. He has wheezes (Expiratory wheezes).  Abdominal: Soft. Bowel sounds are normal. There is no tenderness.  Musculoskeletal: Normal range of motion. He exhibits no edema.  Lymphadenopathy:    He has no cervical adenopathy.       Right: No supraclavicular adenopathy present.       Left: No supraclavicular adenopathy present.  Neurological: He is alert and oriented to person, place, and time. No cranial nerve deficit.  Skin: Skin is warm and dry. No rash noted.  Psychiatric: Mood, memory, affect and judgment normal.  Nursing note and vitals reviewed.    LABORATORY DATA:  I have reviewed the labs as listed.  CBC    Component Value Date/Time   WBC 7.3 11/18/2016 0833   RBC 3.41 (L) 11/18/2016 0833   HGB 11.5 (L) 11/18/2016 0833   HCT 34.9 (L) 11/18/2016 0833   PLT 154 11/18/2016 0833   MCV 102.3 (H) 11/18/2016 0833   MCH 33.7 11/18/2016 0833   MCHC 33.0 11/18/2016 0833   RDW 18.8 (H) 11/18/2016 0833   LYMPHSABS 2.5 11/18/2016 0833   MONOABS 1.5 (H) 11/18/2016 0833   EOSABS 0.1 11/18/2016 0833   BASOSABS 0.0 11/18/2016 0833   CMP Latest Ref Rng & Units 11/18/2016 11/04/2016 10/21/2016  Glucose 65 - 99 mg/dL 201(H) 171(H) 131(H)  BUN 6 - 20 mg/dL 10 8 12    Creatinine 0.61 - 1.24 mg/dL 0.87 0.91 1.11  Sodium 135 - 145 mmol/L 137 138 138  Potassium 3.5 - 5.1 mmol/L 4.1 4.3 4.2  Chloride 101 - 111 mmol/L 102 102 105  CO2 22 - 32 mmol/L 26 28 25   Calcium 8.9 - 10.3 mg/dL 9.1 8.9 8.8(L)  Total Protein 6.5 -  8.1 g/dL 6.6 6.8 6.9  Total Bilirubin 0.3 - 1.2 mg/dL 1.0 0.7 0.7  Alkaline Phos 38 - 126 U/L 41 45 47  AST 15 - 41 U/L 32 31 29  ALT 17 - 63 U/L 23 24 21     PENDING LABS:    DIAGNOSTIC IMAGING:  *The following radiologic images and reports have been reviewed independently and agree with below findings.  CT abd/pelvis: 07/14/16 CLINICAL DATA:  Post RIGHT hemicolectomy on 07/11/2016 for colon cancer, vomiting  EXAM: CT ABDOMEN AND PELVIS WITH CONTRAST  TECHNIQUE: Multidetector CT imaging of the abdomen and pelvis was performed using the standard protocol following bolus administration of intravenous contrast. Sagittal and coronal MPR images reconstructed from axial data set.  CONTRAST:  30mL ISOVUE-300 IOPAMIDOL (ISOVUE-300) INJECTION 61% PO, 128mL ISOVUE-300 IOPAMIDOL (ISOVUE-300) INJECTION 61% IV  COMPARISON:  06/20/2016  FINDINGS: Lower chest: Bibasilar atelectasis  Hepatobiliary: Few beam hardening artifacts from ribs traverse liver. Suspected tiny dependent gallstones. No definite biliary dilatation.  Pancreas: Normal appearance  Spleen: Surgically absent. Probable small splenule LEFT upper quadrant 19 x 13 mm image 21.  Adrenals/Urinary Tract: Adrenal glands, kidneys, ureters, and bladder normal appearance  Stomach/Bowel: Ileocolic anastomosis at the proximal to mid transverse colon. Diffuse dilatation of small bowel loops and stomach likely reflecting ileus. No evidence of gastric outlet obstruction. Mild bowel wall thickening of the distal ileum at the anastomosis consistent with recent surgery. Sigmoid diverticulosis. Remaining residual colon unremarkable.  Vascular/Lymphatic: Scattered pelvic  phleboliths. Scattered atherosclerotic calcifications without aneurysm. IVC filter noted. No definite adenopathy.  Reproductive: Enlarged prostate gland 6.7 x 5.5 x 5.8 cm. Seminal vesicles unremarkable.  Other: Low-attenuation free primarily in the RIGHT gutter and perihepatic region though a small amount is also present in the pelvis dependently and interloop. Free intraperitoneal air greatest in RIGHT upper quadrant. These are likely related to recent surgery. No hernia.  Musculoskeletal: Osteoarthritic changes of the RIGHT hip joint. Scattered degenerative disc and facet disease changes lower lumbar spine.  IMPRESSION: Dilated stomach and small bowel loops question postoperative ileus.  Free air and free fluid likely related to recent surgery.  Distal colonic diverticulosis.  Bibasilar atelectasis.  Prostatic enlargement.   Electronically Signed   By: Lavonia Dana M.D.   On: 07/14/2016 11:24     PATHOLOGY:  Colon resection surgical path: 07/11/16          ASSESSMENT & PLAN:   Stage IIIB adenocarcinoma of colon:  -Diagnosed in 05/2016 during colonoscopy with Dr. Laural Kirk. Baseline CEA normal at 2.6.  CT abd/pelvis did not reveal any evidence of metastatic disease.  Colon surgical resection performed by Dr. Arnoldo Kirk on 07/11/16. Started chemotherapy with FOLFOX on 08/26/16.  -Due for cycle #7 FOLFOX today.  5-FU (both bolus and continuous dose) were dose-reduced with cycle #5 going forward.  Labs reviewed and are adequate for treatment today.    -Continue chemo every 2 weeks as scheduled for total of 12 cycles.  -Return to cancer center in 1 month for follow-up with treatment.   Diarrhea:  -Improved with Imodium and Lomotil. Continue PRN.  -Encouraged him to continue to push non-caffeinated fluids as tolerated.       Dispo:  -Continue FOLFOX chemo every 2 weeks.  -Return to cancer center for follow-up in 1 month.    All questions were answered to  patient's stated satisfaction. Encouraged patient to call with any new concerns or questions before his next visit to the cancer center and we can certain see him  sooner, if needed.    Plan of care discussed with Dr. Talbert Cage, who agrees with the above aforementioned.    Orders placed this encounter:  No orders of the defined types were placed in this encounter.     Mike Craze, NP [place] [phone]

## 2016-11-20 ENCOUNTER — Encounter (HOSPITAL_BASED_OUTPATIENT_CLINIC_OR_DEPARTMENT_OTHER): Payer: Medicare Other

## 2016-11-20 ENCOUNTER — Encounter (HOSPITAL_COMMUNITY): Payer: Self-pay

## 2016-11-20 VITALS — BP 120/63 | HR 67 | Temp 98.0°F | Resp 16

## 2016-11-20 DIAGNOSIS — C184 Malignant neoplasm of transverse colon: Secondary | ICD-10-CM

## 2016-11-20 MED ORDER — HEPARIN SOD (PORK) LOCK FLUSH 100 UNIT/ML IV SOLN
INTRAVENOUS | Status: AC
  Filled 2016-11-20: qty 5

## 2016-11-20 MED ORDER — SODIUM CHLORIDE 0.9% FLUSH
10.0000 mL | INTRAVENOUS | Status: DC | PRN
  Administered 2016-11-20: 10 mL
  Filled 2016-11-20: qty 10

## 2016-11-20 MED ORDER — HEPARIN SOD (PORK) LOCK FLUSH 100 UNIT/ML IV SOLN
500.0000 [IU] | Freq: Once | INTRAVENOUS | Status: AC | PRN
  Administered 2016-11-20: 500 [IU]

## 2016-11-20 NOTE — Progress Notes (Signed)
Patients 5 FU pump removed per protocol.  Flushed with NACL 10 ml and heparin 100 units/ml (2ml) with no complaints voiced.  Site clean and dry with no bruising noted at site.  Band aid applied.  VSS stable with discharge.  Left ambulatory with family.  Reminded of next appointments.

## 2016-11-20 NOTE — Patient Instructions (Signed)
[place] at [facility]  Discharge Instructions:  Chemotherapy pump dc'd today.  Keep next scheduled appointment.  Call for any questions or concerns.   _______________________________________________________________  Thank you for choosing [place] at [facility] to provide your oncology and hematology care.  To afford each patient quality time with our providers, please arrive at least 15 minutes before your scheduled appointment.  You need to re-schedule your appointment if you arrive 10 or more minutes late.  We strive to give you quality time with our providers, and arriving late affects you and other patients whose appointments are after yours.  Also, if you no show three or more times for appointments you may be dismissed from the clinic.  Again, thank you for choosing [facility] at [facility] hope is that these requests will allow you access to exceptional care and in a timely manner. _______________________________________________________________  If you have questions after your visit, please contact our office at [phone] between the hours of 8:30 a.m. and 5:00 p.m. Voicemails left after 4:30 p.m. will not be returned until the following business day. _______________________________________________________________  For prescription refill requests, have your pharmacy contact our office. _______________________________________________________________  Recommendations made by the consultant and any test results will be sent to your referring physician. _______________________________________________________________

## 2016-12-02 ENCOUNTER — Encounter (HOSPITAL_COMMUNITY): Payer: Medicare Other | Attending: Oncology

## 2016-12-02 ENCOUNTER — Encounter (HOSPITAL_COMMUNITY): Payer: Self-pay

## 2016-12-02 VITALS — BP 130/64 | HR 67 | Temp 97.9°F | Resp 18 | Wt 204.8 lb

## 2016-12-02 DIAGNOSIS — C184 Malignant neoplasm of transverse colon: Secondary | ICD-10-CM | POA: Diagnosis present

## 2016-12-02 DIAGNOSIS — Z5111 Encounter for antineoplastic chemotherapy: Secondary | ICD-10-CM | POA: Diagnosis present

## 2016-12-02 DIAGNOSIS — D509 Iron deficiency anemia, unspecified: Secondary | ICD-10-CM | POA: Diagnosis present

## 2016-12-02 LAB — COMPREHENSIVE METABOLIC PANEL
ALT: 20 U/L (ref 17–63)
AST: 26 U/L (ref 15–41)
Albumin: 3.5 g/dL (ref 3.5–5.0)
Alkaline Phosphatase: 42 U/L (ref 38–126)
Anion gap: 8 (ref 5–15)
BUN: 11 mg/dL (ref 6–20)
CO2: 25 mmol/L (ref 22–32)
Calcium: 9 mg/dL (ref 8.9–10.3)
Chloride: 101 mmol/L (ref 101–111)
Creatinine, Ser: 0.76 mg/dL (ref 0.61–1.24)
GFR calc Af Amer: >60 mL/min (ref >60)
GFR calc non Af Amer: >60 mL/min (ref >60)
Glucose, Bld: 206 mg/dL — ABNORMAL HIGH (ref 65–99)
Potassium: 3.8 mmol/L (ref 3.5–5.1)
Sodium: 134 mmol/L — ABNORMAL LOW (ref 135–145)
Total Bilirubin: 0.7 mg/dL (ref 0.3–1.2)
Total Protein: 6.8 g/dL (ref 6.5–8.1)

## 2016-12-02 LAB — CBC WITH DIFFERENTIAL/PLATELET
Basophils Absolute: 0 10*3/uL (ref 0.0–0.1)
Basophils Relative: 0 %
Eosinophils Absolute: 0.1 10*3/uL (ref 0.0–0.7)
Eosinophils Relative: 1 %
HCT: 35.2 % — ABNORMAL LOW (ref 39.0–52.0)
Hemoglobin: 11.8 g/dL — ABNORMAL LOW (ref 13.0–17.0)
Lymphocytes Relative: 29 %
Lymphs Abs: 2.6 10*3/uL (ref 0.7–4.0)
MCH: 34.6 pg — ABNORMAL HIGH (ref 26.0–34.0)
MCHC: 33.5 g/dL (ref 30.0–36.0)
MCV: 103.2 fL — ABNORMAL HIGH (ref 78.0–100.0)
Monocytes Absolute: 1.7 10*3/uL — ABNORMAL HIGH (ref 0.1–1.0)
Monocytes Relative: 19 %
Neutro Abs: 4.5 10*3/uL (ref 1.7–7.7)
Neutrophils Relative %: 51 %
Platelets: 150 10*3/uL (ref 150–400)
RBC: 3.41 MIL/uL — ABNORMAL LOW (ref 4.22–5.81)
RDW: 16.8 % — ABNORMAL HIGH (ref 11.5–15.5)
WBC: 9 10*3/uL (ref 4.0–10.5)

## 2016-12-02 MED ORDER — PALONOSETRON HCL INJECTION 0.25 MG/5ML
0.2500 mg | Freq: Once | INTRAVENOUS | Status: AC
  Administered 2016-12-02: 0.25 mg via INTRAVENOUS

## 2016-12-02 MED ORDER — DEXTROSE 5 % IV SOLN
413.0000 mg/m2 | Freq: Once | INTRAVENOUS | Status: AC
  Administered 2016-12-02: 900 mg via INTRAVENOUS
  Filled 2016-12-02: qty 35

## 2016-12-02 MED ORDER — FLUOROURACIL CHEMO INJECTION 5 GM/100ML
1800.0000 mg/m2 | INTRAVENOUS | Status: DC
  Administered 2016-12-02: 3900 mg via INTRAVENOUS
  Filled 2016-12-02: qty 78

## 2016-12-02 MED ORDER — DEXAMETHASONE SODIUM PHOSPHATE 10 MG/ML IJ SOLN
INTRAMUSCULAR | Status: AC
  Filled 2016-12-02: qty 1

## 2016-12-02 MED ORDER — FLUOROURACIL CHEMO INJECTION 2.5 GM/50ML
300.0000 mg/m2 | Freq: Once | INTRAVENOUS | Status: AC
  Administered 2016-12-02: 650 mg via INTRAVENOUS
  Filled 2016-12-02: qty 13

## 2016-12-02 MED ORDER — PALONOSETRON HCL INJECTION 0.25 MG/5ML
INTRAVENOUS | Status: AC
Start: 2016-12-02 — End: ?
  Filled 2016-12-02: qty 5

## 2016-12-02 MED ORDER — OXALIPLATIN CHEMO INJECTION 100 MG/20ML
68.0000 mg/m2 | Freq: Once | INTRAVENOUS | Status: AC
  Administered 2016-12-02: 150 mg via INTRAVENOUS
  Filled 2016-12-02: qty 20

## 2016-12-02 MED ORDER — DEXAMETHASONE SODIUM PHOSPHATE 10 MG/ML IJ SOLN
10.0000 mg | Freq: Once | INTRAMUSCULAR | Status: AC
  Administered 2016-12-02: 10 mg via INTRAVENOUS

## 2016-12-02 MED ORDER — SODIUM CHLORIDE 0.9% FLUSH
10.0000 mL | INTRAVENOUS | Status: DC | PRN
  Administered 2016-12-02: 10 mL
  Filled 2016-12-02: qty 10

## 2016-12-02 MED ORDER — DEXTROSE 5 % IV SOLN
Freq: Once | INTRAVENOUS | Status: AC
  Administered 2016-12-02: 11:00:00 via INTRAVENOUS

## 2016-12-02 NOTE — Progress Notes (Signed)
Labs reviewed with MD. Proceed with treatment. Treatment given per orders. Patient tolerated it well without problems. Vitals stable and discharged home from clinic ambulatory. Follow up as scheduled. Continuous 5FU pump connected per protocol.

## 2016-12-02 NOTE — Patient Instructions (Signed)
[facility] Discharge Instructions for Patients Receiving Chemotherapy   Beginning January 23rd 2017 lab work for the Cancer Center will be done in the  Main lab at [facility] on 1st floor. If you have a lab appointment with the Cancer Center please come in thru the  Main Entrance and check in at the main information desk   Today you received the following chemotherapy agents   To help prevent nausea and vomiting after your treatment, we encourage you to take your nausea medication     If you develop nausea and vomiting, or diarrhea that is not controlled by your medication, call the clinic.  The clinic phone number is [phone]. Office hours are Monday-Friday 8:30am-5:00pm.  BELOW ARE SYMPTOMS THAT SHOULD BE REPORTED IMMEDIATELY:  *FEVER GREATER THAN 101.0 F  *CHILLS WITH OR WITHOUT FEVER  NAUSEA AND VOMITING THAT IS NOT CONTROLLED WITH YOUR NAUSEA MEDICATION  *UNUSUAL SHORTNESS OF BREATH  *UNUSUAL BRUISING OR BLEEDING  TENDERNESS IN MOUTH AND THROAT WITH OR WITHOUT PRESENCE OF ULCERS  *URINARY PROBLEMS  *BOWEL PROBLEMS  UNUSUAL RASH Items with * indicate a potential emergency and should be followed up as soon as possible. If you have an emergency after office hours please contact your primary care physician or go to the nearest emergency department.  Please call the clinic during office hours if you have any questions or concerns.   You may also contact the Patient Navigator at [phone] should you have any questions or need assistance in obtaining follow up care.      Resources For Cancer Patients and their Caregivers ? American Cancer Society: Can assist with transportation, wigs, general needs, runs Look Good Feel Better.        1-888-227-6333 ? Cancer Care: Provides financial assistance, online support groups, medication/co-pay assistance.  1-800-813-HOPE (4673) ? [facility] Assists [place] cancer  patients and their families through emotional , educational and financial support.  336-427-4357 ? [place] DSS Where to apply for food stamps, Medicaid and utility assistance. 336-342-1394 ? [facility]: Transportation to medical appointments. 336-347-2287 ? Social Security Administration: May apply for disability if have a Stage IV cancer. 336-342-7796 1-800-772-1213 ? [place] Aging, Disability and Transit Services: Assists with nutrition, care and transit needs. 336-349-2343         

## 2016-12-04 ENCOUNTER — Encounter (HOSPITAL_BASED_OUTPATIENT_CLINIC_OR_DEPARTMENT_OTHER): Payer: Medicare Other

## 2016-12-04 ENCOUNTER — Encounter (HOSPITAL_COMMUNITY): Payer: Self-pay

## 2016-12-04 DIAGNOSIS — C184 Malignant neoplasm of transverse colon: Secondary | ICD-10-CM | POA: Diagnosis present

## 2016-12-04 MED ORDER — SODIUM CHLORIDE 0.9% FLUSH
10.0000 mL | INTRAVENOUS | Status: DC | PRN
  Administered 2016-12-04: 10 mL via INTRAVENOUS
  Filled 2016-12-04: qty 10

## 2016-12-04 MED ORDER — HEPARIN SOD (PORK) LOCK FLUSH 100 UNIT/ML IV SOLN
INTRAVENOUS | Status: AC
  Filled 2016-12-04: qty 5

## 2016-12-04 MED ORDER — HEPARIN SOD (PORK) LOCK FLUSH 100 UNIT/ML IV SOLN
500.0000 [IU] | Freq: Once | INTRAVENOUS | Status: AC
  Administered 2016-12-04: 500 [IU] via INTRAVENOUS

## 2016-12-04 NOTE — Progress Notes (Signed)
Pt here today for chemo pump removal. Pt states that he has noticed some numbness in his lips but that he normally experiences that. Pt tolerated well. Pt stable and discharged home ambulatory.

## 2016-12-16 ENCOUNTER — Encounter (HOSPITAL_BASED_OUTPATIENT_CLINIC_OR_DEPARTMENT_OTHER): Payer: Medicare Other | Admitting: Oncology

## 2016-12-16 ENCOUNTER — Encounter (HOSPITAL_BASED_OUTPATIENT_CLINIC_OR_DEPARTMENT_OTHER): Payer: Medicare Other

## 2016-12-16 ENCOUNTER — Encounter (HOSPITAL_COMMUNITY): Payer: Self-pay | Admitting: Oncology

## 2016-12-16 VITALS — BP 152/84 | HR 77 | Temp 97.3°F | Resp 18 | Wt 203.4 lb

## 2016-12-16 DIAGNOSIS — C184 Malignant neoplasm of transverse colon: Secondary | ICD-10-CM | POA: Diagnosis present

## 2016-12-16 DIAGNOSIS — Z5111 Encounter for antineoplastic chemotherapy: Secondary | ICD-10-CM | POA: Diagnosis present

## 2016-12-16 LAB — CBC WITH DIFFERENTIAL/PLATELET
Basophils Absolute: 0.1 10*3/uL (ref 0.0–0.1)
Basophils Relative: 1 %
Eosinophils Absolute: 0.2 10*3/uL (ref 0.0–0.7)
Eosinophils Relative: 3 %
HCT: 35.7 % — ABNORMAL LOW (ref 39.0–52.0)
Hemoglobin: 11.8 g/dL — ABNORMAL LOW (ref 13.0–17.0)
Lymphocytes Relative: 36 %
Lymphs Abs: 2.8 10*3/uL (ref 0.7–4.0)
MCH: 35.1 pg — ABNORMAL HIGH (ref 26.0–34.0)
MCHC: 33.1 g/dL (ref 30.0–36.0)
MCV: 106.3 fL — ABNORMAL HIGH (ref 78.0–100.0)
Monocytes Absolute: 1.3 10*3/uL — ABNORMAL HIGH (ref 0.1–1.0)
Monocytes Relative: 17 %
Neutro Abs: 3.3 10*3/uL (ref 1.7–7.7)
Neutrophils Relative %: 43 %
Platelets: 165 10*3/uL (ref 150–400)
RBC: 3.36 MIL/uL — ABNORMAL LOW (ref 4.22–5.81)
RDW: 15.4 % (ref 11.5–15.5)
WBC: 7.7 10*3/uL (ref 4.0–10.5)

## 2016-12-16 LAB — COMPREHENSIVE METABOLIC PANEL
ALT: 24 U/L (ref 17–63)
AST: 31 U/L (ref 15–41)
Albumin: 3.5 g/dL (ref 3.5–5.0)
Alkaline Phosphatase: 44 U/L (ref 38–126)
Anion gap: 8 (ref 5–15)
BUN: 8 mg/dL (ref 6–20)
CO2: 26 mmol/L (ref 22–32)
Calcium: 8.8 mg/dL — ABNORMAL LOW (ref 8.9–10.3)
Chloride: 105 mmol/L (ref 101–111)
Creatinine, Ser: 0.86 mg/dL (ref 0.61–1.24)
GFR calc Af Amer: >60 mL/min (ref >60)
GFR calc non Af Amer: >60 mL/min (ref >60)
Glucose, Bld: 124 mg/dL — ABNORMAL HIGH (ref 65–99)
Potassium: 4 mmol/L (ref 3.5–5.1)
Sodium: 139 mmol/L (ref 135–145)
Total Bilirubin: 0.5 mg/dL (ref 0.3–1.2)
Total Protein: 6.8 g/dL (ref 6.5–8.1)

## 2016-12-16 MED ORDER — DEXTROSE 5 % IV SOLN
413.0000 mg/m2 | Freq: Once | INTRAVENOUS | Status: AC
  Administered 2016-12-16: 900 mg via INTRAVENOUS
  Filled 2016-12-16: qty 35

## 2016-12-16 MED ORDER — SODIUM CHLORIDE 0.9 % IV SOLN
1800.0000 mg/m2 | INTRAVENOUS | Status: DC
  Administered 2016-12-16: 3900 mg via INTRAVENOUS
  Filled 2016-12-16: qty 78

## 2016-12-16 MED ORDER — PALONOSETRON HCL INJECTION 0.25 MG/5ML
0.2500 mg | Freq: Once | INTRAVENOUS | Status: AC
  Administered 2016-12-16: 0.25 mg via INTRAVENOUS
  Filled 2016-12-16: qty 5

## 2016-12-16 MED ORDER — FLUOROURACIL CHEMO INJECTION 2.5 GM/50ML
300.0000 mg/m2 | Freq: Once | INTRAVENOUS | Status: AC
  Administered 2016-12-16: 650 mg via INTRAVENOUS
  Filled 2016-12-16: qty 13

## 2016-12-16 MED ORDER — OXALIPLATIN CHEMO INJECTION 100 MG/20ML
68.0000 mg/m2 | Freq: Once | INTRAVENOUS | Status: AC
  Administered 2016-12-16: 150 mg via INTRAVENOUS
  Filled 2016-12-16: qty 20

## 2016-12-16 MED ORDER — DEXTROSE 5 % IV SOLN
Freq: Once | INTRAVENOUS | Status: AC
  Administered 2016-12-16: 10:00:00 via INTRAVENOUS

## 2016-12-16 MED ORDER — DEXAMETHASONE SODIUM PHOSPHATE 10 MG/ML IJ SOLN
10.0000 mg | Freq: Once | INTRAMUSCULAR | Status: AC
  Administered 2016-12-16: 10 mg via INTRAVENOUS
  Filled 2016-12-16: qty 1

## 2016-12-16 MED ORDER — SODIUM CHLORIDE 0.9% FLUSH
10.0000 mL | INTRAVENOUS | Status: DC | PRN
  Administered 2016-12-16: 10 mL
  Filled 2016-12-16: qty 10

## 2016-12-16 NOTE — Patient Instructions (Signed)
[facility] Discharge Instructions for Patients Receiving Chemotherapy   Beginning January 23rd 2017 lab work for the Cancer Center will be done in the  Main lab at [facility] on 1st floor. If you have a lab appointment with the Cancer Center please come in thru the  Main Entrance and check in at the main information desk   Today you received the following chemotherapy agents   To help prevent nausea and vomiting after your treatment, we encourage you to take your nausea medication     If you develop nausea and vomiting, or diarrhea that is not controlled by your medication, call the clinic.  The clinic phone number is [phone]. Office hours are Monday-Friday 8:30am-5:00pm.  BELOW ARE SYMPTOMS THAT SHOULD BE REPORTED IMMEDIATELY:  *FEVER GREATER THAN 101.0 F  *CHILLS WITH OR WITHOUT FEVER  NAUSEA AND VOMITING THAT IS NOT CONTROLLED WITH YOUR NAUSEA MEDICATION  *UNUSUAL SHORTNESS OF BREATH  *UNUSUAL BRUISING OR BLEEDING  TENDERNESS IN MOUTH AND THROAT WITH OR WITHOUT PRESENCE OF ULCERS  *URINARY PROBLEMS  *BOWEL PROBLEMS  UNUSUAL RASH Items with * indicate a potential emergency and should be followed up as soon as possible. If you have an emergency after office hours please contact your primary care physician or go to the nearest emergency department.  Please call the clinic during office hours if you have any questions or concerns.   You may also contact the Patient Navigator at [phone] should you have any questions or need assistance in obtaining follow up care.      Resources For Cancer Patients and their Caregivers ? American Cancer Society: Can assist with transportation, wigs, general needs, runs Look Good Feel Better.        1-888-227-6333 ? Cancer Care: Provides financial assistance, online support groups, medication/co-pay assistance.  1-800-813-HOPE (4673) ? [facility] Assists [place] cancer  patients and their families through emotional , educational and financial support.  336-427-4357 ? [place] DSS Where to apply for food stamps, Medicaid and utility assistance. 336-342-1394 ? [facility]: Transportation to medical appointments. 336-347-2287 ? Social Security Administration: May apply for disability if have a Stage IV cancer. 336-342-7796 1-800-772-1213 ? [place] Aging, Disability and Transit Services: Assists with nutrition, care and transit needs. 336-349-2343         

## 2016-12-16 NOTE — Progress Notes (Signed)
[address], [place] [address]   CLINIC:  Medical Oncology/Hematology  PCP:  Tobe Sos, MD [place] [address] [phone]   REASON FOR VISIT:  Follow-up for Stage IIIB adenocarcinoma of colon   CURRENT THERAPY: FOLFOX, beginning 08/26/16    HISTORY OF PRESENT ILLNESS:  (From Dr. Laverle Patter  note on 09/23/16)  Kevin Kirk 83 y.o. male is here because of referral by Dr. Aviva Signs. The patient initially noticed bright red blood in his stool in December 2017.   The patient underwent colonoscopy on 06/05/16 with Dr. Laural Golden which showed a fungating, polypoid and ulcerated non-obstructing large mass was found in the mid transverse colon. The mass was partially circumferential (involving two-thirds of the lumen circumference). The mass measured three cm in length. No bleeding was present. Subsequent biopsy on 06/05/16 revealed adenocarcinoma.  06/20/16: CT abd/pelvis with contrast: Mild focal wall thickening along the proximal/mid transverse colon in the right mid abdomen, possibly corresponding to the abnormality on colonoscopy, equivocal. No findings suspicious for metastatic disease in the abdomen/pelvis.  07/11/16: Partial colectomy by Dr. Arnoldo Morale. This confirmed adenocarcinoma of the colon, grade 2, spanning 3.8 cm. The tumor invaded through the muscularis propria into the pericolonic soft tissues. Additional lymphovascular invasion was identified. Margins of resection were negative.  Of 16 lymph nodes sampled, one was found positive for metastatic disease. All other lymph nodes negative.  08/04/16: CTA chest: No demonstrable pulmonary embolus. Ascending thoracic aortic diameter is 4.6 x 4.4 cm. No dissection evident. Ascending thoracic aortic aneurysm. Granuloma right base, calcified. Bibasilar atelectasis. Mild calcification along the posterior right apical pleural region without pleural thickening or mass associated. No adenopathy.  Scattered foci of aortic and coronary artery calcification. Status post aortic valve replacement.  Baseline CEA: 2.6    INTERVAL HISTORY:  Kevin Kirk 83 y.o. male returns to cancer center for follow-up for colon cancer.   Due for cycle #9 FOLFOX chemotherapy today. He states he's been doing well. His diarrhea post treatment is controlled by imodium. He has chronic neuropathy in his feet with a burning sensation but its stable. Appetite is good. Weight is stable. No mucositis.   REVIEW OF SYSTEMS:  Review of Systems  Constitutional: Positive for fatigue. Negative for chills and fever.  HENT:  Negative.  Negative for mouth sores.   Eyes: Negative.   Respiratory: Negative.  Negative for cough and shortness of breath.   Cardiovascular: Negative.   Gastrointestinal: Negative for diarrhea.  Endocrine: Negative.   Genitourinary: Negative.  Negative for dysuria and hematuria.   Musculoskeletal: Negative for back pain and neck pain.  Neurological:       Burning to bottom of feet  Hematological: Negative.   Psychiatric/Behavioral: Negative.      PAST MEDICAL/SURGICAL HISTORY:  Past Medical History:  Diagnosis Date  . Chronic pulmonary embolism ([place]) 67/8938   compliction of the valve replacement surgery  . COPD (chronic obstructive pulmonary disease) ([place])   . Diabetes ([place]) 04/22/2016  . H/O aortic valve replacement 10/2008  . Hypercholesteremia   . Hypertension   . Pulmonary emboli ([place]) 10/2008  . Sleep apnea    Past Surgical History:  Procedure Laterality Date  . AORTIC VALVE REPLACEMENT     2010  . CARDIAC CATHETERIZATION  2010  . COLONOSCOPY N/A 06/05/2016   Procedure: COLONOSCOPY;  Surgeon: Rogene Houston, MD;  Location: [facility];  Service: Endoscopy;  Laterality: N/A;  . PARTIAL COLECTOMY N/A  07/11/2016   Procedure: PARTIAL COLECTOMY;  Surgeon: Aviva Signs, MD;  Location: [facility] ORS;  Service: General;  Laterality: N/A;  . PORTACATH PLACEMENT N/A 08/13/2016    Procedure: INSERTION PORT-A-CATH LEFT SUBCLAVIAN;  Surgeon: Aviva Signs, MD;  Location: [facility] ORS;  Service: General;  Laterality: N/A;  . REPLACEMENT TOTAL KNEE     1996  . spenectomy     from trauma     SOCIAL HISTORY:  Social History   Social History  . Marital status: Widowed    Spouse name: N/A  . Number of children: N/A  . Years of education: N/A   Occupational History  . Not on file.   Social History Main Topics  . Smoking status: Former Smoker    Years: 15.00    Types: Cigarettes    Quit date: 1964  . Smokeless tobacco: Never Used  . Alcohol use No  . Drug use: No  . Sexual activity: Yes   Other Topics Concern  . Not on file   Social History Narrative  . No narrative on file    FAMILY HISTORY:  Family History  Problem Relation Age of Onset  . Colon cancer Neg Hx     CURRENT MEDICATIONS:  Outpatient Encounter Prescriptions as of 12/16/2016  Medication Sig Note  . aspirin EC 81 MG tablet Take 81 mg by mouth daily.   Marland Kitchen atorvastatin (LIPITOR) 10 MG tablet Take 10 mg by mouth at bedtime.    . Cholecalciferol (VITAMIN D3) 5000 units CAPS Take 5,000 Units by mouth daily.    . Diphenhyd-Hydrocort-Nystatin (FIRST-DUKES MOUTHWASH) SUSP Use as directed 5 mLs in the mouth or throat 4 (four) times daily as needed.   . diphenoxylate-atropine (LOMOTIL) 2.5-0.025 MG tablet Take 1 tablet by mouth 4 (four) times daily as needed for diarrhea or loose stools.   . ferrous sulfate 325 (65 FE) MG tablet Take 325 mg by mouth daily with breakfast.   . finasteride (PROSCAR) 5 MG tablet Take 5 mg by mouth at bedtime.    . fluorouracil CALGB 29528 in sodium chloride 0.9 % 150 mL Inject into the vein over 96 hr. Every 2 weeks, infusion pump for 46 hours   . leucovorin in dextrose 5 % 250 mL Inject into the vein once. Every 2 weeks   . lidocaine-prilocaine (EMLA) cream Apply to affected area once 08/05/2016: New Med   . lisinopril (PRINIVIL,ZESTRIL) 5 MG tablet Take 5 mg by mouth at  bedtime.    Marland Kitchen MAGNESIUM PO Take 265 mg by mouth daily.   . metFORMIN (GLUCOPHAGE) 500 MG tablet Take 500 mg by mouth 2 (two) times daily.   . metoprolol (LOPRESSOR) 50 MG tablet Take 50 mg by mouth 2 (two) times daily.   . ondansetron (ZOFRAN) 8 MG tablet Take 1 tablet (8 mg total) by mouth 2 (two) times daily as needed for refractory nausea / vomiting. Start on day 3 after chemotherapy. 08/05/2016: New Med  . OXALIPLATIN IV Inject into the vein. Every 2 weeks   . prochlorperazine (COMPAZINE) 10 MG tablet Take 1 tablet (10 mg total) by mouth every 6 (six) hours as needed (Nausea or vomiting). 08/05/2016: New Med  . vitamin B-12 (CYANOCOBALAMIN) 1000 MCG tablet Take 1,000 mcg by mouth daily.    . [DISCONTINUED] acetaminophen (TYLENOL) 500 MG tablet Take 500 mg by mouth daily as needed for moderate pain or headache. 07/03/2016: Seldom    No facility-administered encounter medications on file as of 12/16/2016.     ALLERGIES:  Allergies  Allergen Reactions  . Amoxicillin Hives    Has patient had a PCN reaction causing immediate rash, facial/tongue/throat swelling, SOB or lightheadedness with hypotension: No Has patient had a PCN reaction causing severe rash involving mucus membranes or skin necrosis: Yes Has patient had a PCN reaction that required hospitalization No Has patient had a PCN reaction occurring within the last 10 years: No If all of the above answers are "NO", then may proceed with Cephalosporin use.   . Tamsulosin Hcl     Cant sleep, confusion      PHYSICAL EXAM:  ECOG Performance status: 1-2 - Symptomatic; requires occasional assistance.      Physical Exam  Constitutional: He is oriented to person, place, and time and well-developed, well-nourished, and in no distress.  Seen in chemo chair in infusion area   HENT:  Head: Normocephalic.  Mouth/Throat: Oropharynx is clear and moist.  Eyes: Conjunctivae are normal. No scleral icterus.  Neck: Normal range of motion. Neck  supple.  Cardiovascular: Normal rate and regular rhythm.   Pulmonary/Chest: Effort normal. No respiratory distress. He has no wheezes.  Abdominal: Soft. Bowel sounds are normal. There is no tenderness.  Musculoskeletal: Normal range of motion. He exhibits no edema.  Lymphadenopathy:    He has no cervical adenopathy.       Right: No supraclavicular adenopathy present.       Left: No supraclavicular adenopathy present.  Neurological: He is alert and oriented to person, place, and time. No cranial nerve deficit.  Skin: Skin is warm and dry. No rash noted.  Psychiatric: Mood, memory, affect and judgment normal.  Nursing note and vitals reviewed.    LABORATORY DATA:  I have reviewed the labs as listed.  CBC    Component Value Date/Time   WBC 9.0 12/02/2016 0924   RBC 3.41 (L) 12/02/2016 0924   HGB 11.8 (L) 12/02/2016 0924   HCT 35.2 (L) 12/02/2016 0924   PLT 150 12/02/2016 0924   MCV 103.2 (H) 12/02/2016 0924   MCH 34.6 (H) 12/02/2016 0924   MCHC 33.5 12/02/2016 0924   RDW 16.8 (H) 12/02/2016 0924   LYMPHSABS 2.6 12/02/2016 0924   MONOABS 1.7 (H) 12/02/2016 0924   EOSABS 0.1 12/02/2016 0924   BASOSABS 0.0 12/02/2016 0924   CMP Latest Ref Rng & Units 12/02/2016 11/18/2016 11/04/2016  Glucose 65 - 99 mg/dL 206(H) 201(H) 171(H)  BUN 6 - 20 mg/dL 11 10 8   Creatinine 0.61 - 1.24 mg/dL 0.76 0.87 0.91  Sodium 135 - 145 mmol/L 134(L) 137 138  Potassium 3.5 - 5.1 mmol/L 3.8 4.1 4.3  Chloride 101 - 111 mmol/L 101 102 102  CO2 22 - 32 mmol/L 25 26 28   Calcium 8.9 - 10.3 mg/dL 9.0 9.1 8.9  Total Protein 6.5 - 8.1 g/dL 6.8 6.6 6.8  Total Bilirubin 0.3 - 1.2 mg/dL 0.7 1.0 0.7  Alkaline Phos 38 - 126 U/L 42 41 45  AST 15 - 41 U/L 26 32 31  ALT 17 - 63 U/L 20 23 24     PENDING LABS:    DIAGNOSTIC IMAGING:  *The following radiologic images and reports have been reviewed independently and agree with below findings.  CT abd/pelvis: 07/14/16 CLINICAL DATA:  Post RIGHT hemicolectomy on  07/11/2016 for colon cancer, vomiting  EXAM: CT ABDOMEN AND PELVIS WITH CONTRAST  TECHNIQUE: Multidetector CT imaging of the abdomen and pelvis was performed using the standard protocol following bolus administration of intravenous contrast. Sagittal and coronal MPR images reconstructed  from axial data set.  CONTRAST:  11mL ISOVUE-300 IOPAMIDOL (ISOVUE-300) INJECTION 61% PO, 114mL ISOVUE-300 IOPAMIDOL (ISOVUE-300) INJECTION 61% IV  COMPARISON:  06/20/2016  FINDINGS: Lower chest: Bibasilar atelectasis  Hepatobiliary: Few beam hardening artifacts from ribs traverse liver. Suspected tiny dependent gallstones. No definite biliary dilatation.  Pancreas: Normal appearance  Spleen: Surgically absent. Probable small splenule LEFT upper quadrant 19 x 13 mm image 21.  Adrenals/Urinary Tract: Adrenal glands, kidneys, ureters, and bladder normal appearance  Stomach/Bowel: Ileocolic anastomosis at the proximal to mid transverse colon. Diffuse dilatation of small bowel loops and stomach likely reflecting ileus. No evidence of gastric outlet obstruction. Mild bowel wall thickening of the distal ileum at the anastomosis consistent with recent surgery. Sigmoid diverticulosis. Remaining residual colon unremarkable.  Vascular/Lymphatic: Scattered pelvic phleboliths. Scattered atherosclerotic calcifications without aneurysm. IVC filter noted. No definite adenopathy.  Reproductive: Enlarged prostate gland 6.7 x 5.5 x 5.8 cm. Seminal vesicles unremarkable.  Other: Low-attenuation free primarily in the RIGHT gutter and perihepatic region though a small amount is also present in the pelvis dependently and interloop. Free intraperitoneal air greatest in RIGHT upper quadrant. These are likely related to recent surgery. No hernia.  Musculoskeletal: Osteoarthritic changes of the RIGHT hip joint. Scattered degenerative disc and facet disease changes lower  lumbar spine.  IMPRESSION: Dilated stomach and small bowel loops question postoperative ileus.  Free air and free fluid likely related to recent surgery.  Distal colonic diverticulosis.  Bibasilar atelectasis.  Prostatic enlargement.   Electronically Signed   By: Lavonia Dana M.D.   On: 07/14/2016 11:24     PATHOLOGY:  Colon resection surgical path: 07/11/16          ASSESSMENT & PLAN:   Stage IIIB adenocarcinoma of colon:  -Diagnosed in 05/2016 during colonoscopy with Dr. Laural Golden. Baseline CEA normal at 2.6.  CT abd/pelvis did not reveal any evidence of metastatic disease.  Colon surgical resection performed by Dr. Arnoldo Morale on 07/11/16. Started chemotherapy with FOLFOX on 08/26/16.  -Due for cycle #9 FOLFOX today.  5-FU (both bolus and continuous dose) were dose-reduced with cycle #5 going forward.  Labs reviewed and are adequate for treatment today.    -Continue chemo every 2 weeks as scheduled for total of 12 cycles. Once he completes his 12 cycles, we will order restaging CT C/A/P. -Return to cancer center in 1 month for follow-up with treatment.    Dispo:  -Continue FOLFOX chemo every 2 weeks.  -Return to cancer center for follow-up in 1 month.    All questions were answered to patient's stated satisfaction. Encouraged patient to call with any new concerns or questions before his next visit to the cancer center and we can certain see him sooner, if needed.      Twana First, MD

## 2016-12-16 NOTE — Progress Notes (Signed)
Labs reviewed with MD. Proceed with treatment.   Continuous 5FU pump connected per protocol.   Treatment given per orders. Patient tolerated it well without problems. Vitals stable and discharged home from clinic ambulatory. Follow up as scheduled.

## 2016-12-18 ENCOUNTER — Encounter (HOSPITAL_COMMUNITY): Payer: Medicare Other

## 2016-12-18 ENCOUNTER — Encounter (HOSPITAL_COMMUNITY): Payer: Self-pay

## 2016-12-18 VITALS — BP 140/61 | HR 86 | Temp 98.2°F | Resp 18

## 2016-12-18 DIAGNOSIS — C184 Malignant neoplasm of transverse colon: Secondary | ICD-10-CM | POA: Diagnosis not present

## 2016-12-18 MED ORDER — SODIUM CHLORIDE 0.9% FLUSH
10.0000 mL | INTRAVENOUS | Status: DC | PRN
  Administered 2016-12-18: 10 mL
  Filled 2016-12-18: qty 10

## 2016-12-18 MED ORDER — HEPARIN SOD (PORK) LOCK FLUSH 100 UNIT/ML IV SOLN
INTRAVENOUS | Status: AC
  Filled 2016-12-18: qty 5

## 2016-12-18 MED ORDER — HEPARIN SOD (PORK) LOCK FLUSH 100 UNIT/ML IV SOLN
500.0000 [IU] | Freq: Once | INTRAVENOUS | Status: AC | PRN
  Administered 2016-12-18: 500 [IU]

## 2016-12-18 NOTE — Progress Notes (Signed)
Patient in for 5FU pump removal.  Port flushed per protocol with no complaints of pain with flush.  Stated no problems over the past two days.  Site clean and dry with no bruising or swelling noted at site.  Band aid applied.  VSS with discharge and left ambulatory with friend.  No s/s of distress noted with discharge.

## 2016-12-18 NOTE — Patient Instructions (Signed)
[place] at [facility]  Discharge Instructions:  Your chemotherapy pump was stopped today.  Keep all scheduled appointments and call for any questions or concerns.  _______________________________________________________________  Thank you for choosing [place] at [facility] to provide your oncology and hematology care.  To afford each patient quality time with our providers, please arrive at least 15 minutes before your scheduled appointment.  You need to re-schedule your appointment if you arrive 10 or more minutes late.  We strive to give you quality time with our providers, and arriving late affects you and other patients whose appointments are after yours.  Also, if you no show three or more times for appointments you may be dismissed from the clinic.  Again, thank you for choosing [place] at [place] hope is that these requests will allow you access to exceptional care and in a timely manner. _______________________________________________________________  If you have questions after your visit, please contact our office at [phone] between the hours of 8:30 a.m. and 5:00 p.m. Voicemails left after 4:30 p.m. will not be returned until the following business day. _______________________________________________________________  For prescription refill requests, have your pharmacy contact our office. _______________________________________________________________  Recommendations made by the consultant and any test results will be sent to your referring physician. _______________________________________________________________

## 2016-12-30 ENCOUNTER — Encounter (HOSPITAL_COMMUNITY): Payer: Medicare Other | Attending: Oncology

## 2016-12-30 ENCOUNTER — Encounter (HOSPITAL_COMMUNITY): Payer: Self-pay

## 2016-12-30 VITALS — BP 153/77 | HR 68 | Temp 98.1°F | Resp 18 | Wt 205.8 lb

## 2016-12-30 DIAGNOSIS — C184 Malignant neoplasm of transverse colon: Secondary | ICD-10-CM | POA: Diagnosis present

## 2016-12-30 DIAGNOSIS — Z5111 Encounter for antineoplastic chemotherapy: Secondary | ICD-10-CM

## 2016-12-30 DIAGNOSIS — D509 Iron deficiency anemia, unspecified: Secondary | ICD-10-CM | POA: Diagnosis present

## 2016-12-30 LAB — CBC WITH DIFFERENTIAL/PLATELET
Basophils Absolute: 0 10*3/uL (ref 0.0–0.1)
Basophils Relative: 1 %
Eosinophils Absolute: 0.2 10*3/uL (ref 0.0–0.7)
Eosinophils Relative: 3 %
HCT: 36.4 % — ABNORMAL LOW (ref 39.0–52.0)
Hemoglobin: 11.9 g/dL — ABNORMAL LOW (ref 13.0–17.0)
Lymphocytes Relative: 33 %
Lymphs Abs: 2.4 10*3/uL (ref 0.7–4.0)
MCH: 34.7 pg — ABNORMAL HIGH (ref 26.0–34.0)
MCHC: 32.7 g/dL (ref 30.0–36.0)
MCV: 106.1 fL — ABNORMAL HIGH (ref 78.0–100.0)
Monocytes Absolute: 1.4 10*3/uL — ABNORMAL HIGH (ref 0.1–1.0)
Monocytes Relative: 18 %
Neutro Abs: 3.4 10*3/uL (ref 1.7–7.7)
Neutrophils Relative %: 45 %
Platelets: 138 10*3/uL — ABNORMAL LOW (ref 150–400)
RBC: 3.43 MIL/uL — ABNORMAL LOW (ref 4.22–5.81)
RDW: 14.4 % (ref 11.5–15.5)
WBC: 7.4 10*3/uL (ref 4.0–10.5)

## 2016-12-30 LAB — COMPREHENSIVE METABOLIC PANEL
ALT: 24 U/L (ref 17–63)
AST: 32 U/L (ref 15–41)
Albumin: 3.5 g/dL (ref 3.5–5.0)
Alkaline Phosphatase: 54 U/L (ref 38–126)
Anion gap: 8 (ref 5–15)
BUN: 8 mg/dL (ref 6–20)
CO2: 27 mmol/L (ref 22–32)
Calcium: 9 mg/dL (ref 8.9–10.3)
Chloride: 101 mmol/L (ref 101–111)
Creatinine, Ser: 0.73 mg/dL (ref 0.61–1.24)
GFR calc Af Amer: >60 mL/min (ref >60)
GFR calc non Af Amer: >60 mL/min (ref >60)
Glucose, Bld: 181 mg/dL — ABNORMAL HIGH (ref 65–99)
Potassium: 4.2 mmol/L (ref 3.5–5.1)
Sodium: 136 mmol/L (ref 135–145)
Total Bilirubin: 0.6 mg/dL (ref 0.3–1.2)
Total Protein: 6.8 g/dL (ref 6.5–8.1)

## 2016-12-30 MED ORDER — PALONOSETRON HCL INJECTION 0.25 MG/5ML
INTRAVENOUS | Status: AC
  Filled 2016-12-30: qty 5

## 2016-12-30 MED ORDER — PALONOSETRON HCL INJECTION 0.25 MG/5ML
0.2500 mg | Freq: Once | INTRAVENOUS | Status: AC
  Administered 2016-12-30: 0.25 mg via INTRAVENOUS

## 2016-12-30 MED ORDER — DEXAMETHASONE SODIUM PHOSPHATE 10 MG/ML IJ SOLN
10.0000 mg | Freq: Once | INTRAMUSCULAR | Status: AC
  Administered 2016-12-30: 10 mg via INTRAVENOUS

## 2016-12-30 MED ORDER — OXALIPLATIN CHEMO INJECTION 100 MG/20ML
68.0000 mg/m2 | Freq: Once | INTRAVENOUS | Status: AC
  Administered 2016-12-30: 150 mg via INTRAVENOUS
  Filled 2016-12-30: qty 20

## 2016-12-30 MED ORDER — SODIUM CHLORIDE 0.9 % IV SOLN
1800.0000 mg/m2 | INTRAVENOUS | Status: DC
  Administered 2016-12-30: 3900 mg via INTRAVENOUS
  Filled 2016-12-30: qty 78

## 2016-12-30 MED ORDER — SODIUM CHLORIDE 0.9% FLUSH
10.0000 mL | INTRAVENOUS | Status: DC | PRN
  Administered 2016-12-30: 10 mL
  Filled 2016-12-30: qty 10

## 2016-12-30 MED ORDER — FLUOROURACIL CHEMO INJECTION 2.5 GM/50ML
300.0000 mg/m2 | Freq: Once | INTRAVENOUS | Status: AC
  Administered 2016-12-30: 650 mg via INTRAVENOUS
  Filled 2016-12-30: qty 13

## 2016-12-30 MED ORDER — LEUCOVORIN CALCIUM INJECTION 350 MG
413.0000 mg/m2 | Freq: Once | INTRAVENOUS | Status: AC
  Administered 2016-12-30: 900 mg via INTRAVENOUS
  Filled 2016-12-30: qty 35

## 2016-12-30 MED ORDER — DEXTROSE 5 % IV SOLN
Freq: Once | INTRAVENOUS | Status: AC
  Administered 2016-12-30: 11:00:00 via INTRAVENOUS

## 2016-12-30 MED ORDER — DEXAMETHASONE SODIUM PHOSPHATE 10 MG/ML IJ SOLN
INTRAMUSCULAR | Status: AC
  Filled 2016-12-30: qty 1

## 2016-12-30 NOTE — Progress Notes (Signed)
Labs reviewed with Dr. Talbert Cage and ok to treat today.   Patient tolerated chemotherapy with no complaints voiced.  Port site clean and dry with no bruising or swelling noted at site.  Dressing intact with 5FU pump infusing and no alarms sounding.  VSS with discharge and left ambulatory with family.  Patient reminded when to return with understanding voiced.  No s/s of distress noted with discharge.

## 2016-12-30 NOTE — Patient Instructions (Signed)
[place] Discharge Instructions for Patients Receiving Chemotherapy  Today you received the following chemotherapy agents oxaliplatin, leucovorin, and 5Fu with pump.  Return in 2 days for pump removal.  Keep all scheduled appointments and call for any questions or concerns.   To help prevent nausea and vomiting after your treatment, we encourage you to take your nausea medication.    If you develop nausea and vomiting that is not controlled by your nausea medication, call the clinic.   BELOW ARE SYMPTOMS THAT SHOULD BE REPORTED IMMEDIATELY:  *FEVER GREATER THAN 100.5 F  *CHILLS WITH OR WITHOUT FEVER  NAUSEA AND VOMITING THAT IS NOT CONTROLLED WITH YOUR NAUSEA MEDICATION  *UNUSUAL SHORTNESS OF BREATH  *UNUSUAL BRUISING OR BLEEDING  TENDERNESS IN MOUTH AND THROAT WITH OR WITHOUT PRESENCE OF ULCERS  *URINARY PROBLEMS  *BOWEL PROBLEMS  UNUSUAL RASH Items with * indicate a potential emergency and should be followed up as soon as possible.  Feel free to call the clinic should you have any questions or concerns. The clinic phone number is [phone].  Please show the [place] at check-in to the Emergency Department and triage nurse.

## 2016-12-31 LAB — CEA: CEA: 6.2 ng/mL — ABNORMAL HIGH (ref 0.0–4.7)

## 2017-01-01 ENCOUNTER — Encounter (HOSPITAL_COMMUNITY): Payer: Self-pay

## 2017-01-01 ENCOUNTER — Encounter (HOSPITAL_BASED_OUTPATIENT_CLINIC_OR_DEPARTMENT_OTHER): Payer: Medicare Other

## 2017-01-01 DIAGNOSIS — C184 Malignant neoplasm of transverse colon: Secondary | ICD-10-CM

## 2017-01-01 MED ORDER — HEPARIN SOD (PORK) LOCK FLUSH 100 UNIT/ML IV SOLN
500.0000 [IU] | Freq: Once | INTRAVENOUS | Status: AC | PRN
  Administered 2017-01-01: 500 [IU]
  Filled 2017-01-01: qty 5

## 2017-01-01 MED ORDER — SODIUM CHLORIDE 0.9% FLUSH
10.0000 mL | INTRAVENOUS | Status: DC | PRN
  Administered 2017-01-01: 10 mL
  Filled 2017-01-01: qty 10

## 2017-01-01 NOTE — Progress Notes (Signed)
Kevin Kirk presents to have home infusion pump d/c'd and for port-a-cath deaccess with flush. Portacath located right chest wall accessed with  H 20 needle.  Good blood return present. Portacath flushed with NS and 500U/51ml Heparin, and needle removed intact.  Procedure tolerated well and without incident.  Discharged ambulatory.

## 2017-01-13 ENCOUNTER — Ambulatory Visit: Payer: Medicare Other | Admitting: General Surgery

## 2017-01-13 ENCOUNTER — Encounter (HOSPITAL_BASED_OUTPATIENT_CLINIC_OR_DEPARTMENT_OTHER): Payer: Medicare Other

## 2017-01-13 ENCOUNTER — Encounter (HOSPITAL_COMMUNITY): Payer: Self-pay

## 2017-01-13 ENCOUNTER — Encounter (HOSPITAL_COMMUNITY): Payer: Medicare Other | Attending: Adult Health | Admitting: Adult Health

## 2017-01-13 VITALS — BP 148/69 | HR 69 | Temp 97.6°F | Resp 18 | Wt 204.0 lb

## 2017-01-13 DIAGNOSIS — K921 Melena: Secondary | ICD-10-CM | POA: Diagnosis not present

## 2017-01-13 DIAGNOSIS — C184 Malignant neoplasm of transverse colon: Secondary | ICD-10-CM

## 2017-01-13 DIAGNOSIS — Z5111 Encounter for antineoplastic chemotherapy: Secondary | ICD-10-CM | POA: Diagnosis present

## 2017-01-13 DIAGNOSIS — D509 Iron deficiency anemia, unspecified: Secondary | ICD-10-CM

## 2017-01-13 DIAGNOSIS — Z862 Personal history of diseases of the blood and blood-forming organs and certain disorders involving the immune mechanism: Secondary | ICD-10-CM | POA: Diagnosis not present

## 2017-01-13 DIAGNOSIS — R197 Diarrhea, unspecified: Secondary | ICD-10-CM

## 2017-01-13 DIAGNOSIS — G620 Drug-induced polyneuropathy: Secondary | ICD-10-CM

## 2017-01-13 LAB — COMPREHENSIVE METABOLIC PANEL
ALT: 23 U/L (ref 17–63)
AST: 32 U/L (ref 15–41)
Albumin: 3.5 g/dL (ref 3.5–5.0)
Alkaline Phosphatase: 49 U/L (ref 38–126)
Anion gap: 10 (ref 5–15)
BUN: 9 mg/dL (ref 6–20)
CO2: 27 mmol/L (ref 22–32)
Calcium: 9.2 mg/dL (ref 8.9–10.3)
Chloride: 99 mmol/L — ABNORMAL LOW (ref 101–111)
Creatinine, Ser: 0.82 mg/dL (ref 0.61–1.24)
GFR calc Af Amer: >60 mL/min (ref >60)
GFR calc non Af Amer: >60 mL/min (ref >60)
Glucose, Bld: 210 mg/dL — ABNORMAL HIGH (ref 65–99)
Potassium: 4.3 mmol/L (ref 3.5–5.1)
Sodium: 136 mmol/L (ref 135–145)
Total Bilirubin: 0.6 mg/dL (ref 0.3–1.2)
Total Protein: 6.6 g/dL (ref 6.5–8.1)

## 2017-01-13 LAB — CBC WITH DIFFERENTIAL/PLATELET
Basophils Absolute: 0 10*3/uL (ref 0.0–0.1)
Basophils Relative: 0 %
Eosinophils Absolute: 0.2 10*3/uL (ref 0.0–0.7)
Eosinophils Relative: 2 %
HCT: 35.6 % — ABNORMAL LOW (ref 39.0–52.0)
Hemoglobin: 11.9 g/dL — ABNORMAL LOW (ref 13.0–17.0)
Lymphocytes Relative: 29 %
Lymphs Abs: 2.2 10*3/uL (ref 0.7–4.0)
MCH: 35.4 pg — ABNORMAL HIGH (ref 26.0–34.0)
MCHC: 33.4 g/dL (ref 30.0–36.0)
MCV: 106 fL — ABNORMAL HIGH (ref 78.0–100.0)
Monocytes Absolute: 1.3 10*3/uL — ABNORMAL HIGH (ref 0.1–1.0)
Monocytes Relative: 17 %
Neutro Abs: 4 10*3/uL (ref 1.7–7.7)
Neutrophils Relative %: 52 %
Platelets: 151 10*3/uL (ref 150–400)
RBC: 3.36 MIL/uL — ABNORMAL LOW (ref 4.22–5.81)
RDW: 13.9 % (ref 11.5–15.5)
WBC: 7.7 10*3/uL (ref 4.0–10.5)

## 2017-01-13 LAB — IRON AND TIBC
Iron: 97 ug/dL (ref 45–182)
Saturation Ratios: 28 % (ref 17.9–39.5)
TIBC: 344 ug/dL (ref 250–450)
UIBC: 247 ug/dL

## 2017-01-13 LAB — FERRITIN: Ferritin: 140 ng/mL (ref 24–336)

## 2017-01-13 MED ORDER — DEXAMETHASONE SODIUM PHOSPHATE 10 MG/ML IJ SOLN
10.0000 mg | Freq: Once | INTRAMUSCULAR | Status: AC
  Administered 2017-01-13: 10 mg via INTRAVENOUS

## 2017-01-13 MED ORDER — LEUCOVORIN CALCIUM INJECTION 350 MG
413.0000 mg/m2 | Freq: Once | INTRAVENOUS | Status: AC
  Administered 2017-01-13: 900 mg via INTRAVENOUS
  Filled 2017-01-13: qty 35

## 2017-01-13 MED ORDER — PALONOSETRON HCL INJECTION 0.25 MG/5ML
INTRAVENOUS | Status: AC
  Filled 2017-01-13: qty 5

## 2017-01-13 MED ORDER — FLUOROURACIL CHEMO INJECTION 2.5 GM/50ML
300.0000 mg/m2 | Freq: Once | INTRAVENOUS | Status: AC
  Administered 2017-01-13: 650 mg via INTRAVENOUS
  Filled 2017-01-13: qty 13

## 2017-01-13 MED ORDER — SODIUM CHLORIDE 0.9 % IV SOLN
1800.0000 mg/m2 | INTRAVENOUS | Status: DC
  Administered 2017-01-13: 3900 mg via INTRAVENOUS
  Filled 2017-01-13: qty 78

## 2017-01-13 MED ORDER — OXALIPLATIN CHEMO INJECTION 100 MG/20ML
68.0000 mg/m2 | Freq: Once | INTRAVENOUS | Status: AC
  Administered 2017-01-13: 150 mg via INTRAVENOUS
  Filled 2017-01-13: qty 10

## 2017-01-13 MED ORDER — SODIUM CHLORIDE 0.9% FLUSH
10.0000 mL | INTRAVENOUS | Status: DC | PRN
  Administered 2017-01-13: 10 mL
  Filled 2017-01-13: qty 10

## 2017-01-13 MED ORDER — PALONOSETRON HCL INJECTION 0.25 MG/5ML
0.2500 mg | Freq: Once | INTRAVENOUS | Status: AC
  Administered 2017-01-13: 0.25 mg via INTRAVENOUS

## 2017-01-13 MED ORDER — DEXTROSE 5 % IV SOLN
Freq: Once | INTRAVENOUS | Status: AC
  Administered 2017-01-13: 10:00:00 via INTRAVENOUS

## 2017-01-13 MED ORDER — DEXAMETHASONE SODIUM PHOSPHATE 10 MG/ML IJ SOLN
INTRAMUSCULAR | Status: AC
  Filled 2017-01-13: qty 1

## 2017-01-13 NOTE — Patient Instructions (Signed)
[facility] Discharge Instructions for Patients Receiving Chemotherapy   Beginning January 23rd 2017 lab work for the Cancer Center will be done in the  Main lab at  on 1st floor. If you have a lab appointment with the Cancer Center please come in thru the  Main Entrance and check in at the main information desk   Today you received the following chemotherapy agents   To help prevent nausea and vomiting after your treatment, we encourage you to take your nausea medication     If you develop nausea and vomiting, or diarrhea that is not controlled by your medication, call the clinic.  The clinic phone number is [phone]. Office hours are Monday-Friday 8:30am-5:00pm.  BELOW ARE SYMPTOMS THAT SHOULD BE REPORTED IMMEDIATELY:  *FEVER GREATER THAN 101.0 F  *CHILLS WITH OR WITHOUT FEVER  NAUSEA AND VOMITING THAT IS NOT CONTROLLED WITH YOUR NAUSEA MEDICATION  *UNUSUAL SHORTNESS OF BREATH  *UNUSUAL BRUISING OR BLEEDING  TENDERNESS IN MOUTH AND THROAT WITH OR WITHOUT PRESENCE OF ULCERS  *URINARY PROBLEMS  *BOWEL PROBLEMS  UNUSUAL RASH Items with * indicate a potential emergency and should be followed up as soon as possible. If you have an emergency after office hours please contact your primary care physician or go to the nearest emergency department.  Please call the clinic during office hours if you have any questions or concerns.   You may also contact the Patient Navigator at [phone] should you have any questions or need assistance in obtaining follow up care.      Resources For Cancer Patients and their Caregivers ? American Cancer Society: Can assist with transportation, wigs, general needs, runs Look Good Feel Better.        1-888-227-6333 ? Cancer Care: Provides financial assistance, online support groups, medication/co-pay assistance.  1-800-813-HOPE (4673) ? [facility] Assists [place] cancer  patients and their families through emotional , educational and financial support.  336-427-4357 ? [place] DSS Where to apply for food stamps, Medicaid and utility assistance. 336-342-1394 ? [facility]: Transportation to medical appointments. 336-347-2287 ? Social Security Administration: May apply for disability if have a Stage IV cancer. 336-342-7796 1-800-772-1213 ? [place] Aging, Disability and Transit Services: Assists with nutrition, care and transit needs. 336-349-2343         

## 2017-01-13 NOTE — Progress Notes (Signed)
Kevin Kirk, [place] [address]   CLINIC:  Medical Oncology/Hematology  PCP:  Tobe Sos, MD [place] [id number] [phone]   REASON FOR VISIT:  Follow-up for Stage IIIB adenocarcinoma of colon   CURRENT THERAPY: FOLFOX, beginning 08/26/16    HISTORY OF PRESENT ILLNESS:  (From Dr. Laverle Patter note on 09/23/16)  Kevin Kirk 83 y.o. male is here because of referral by Dr. Aviva Signs. The patient initially noticed bright red blood in his stool in December 2017.   The patient underwent colonoscopy on 06/05/16 with Dr. Laural Golden which showed a fungating, polypoid and ulcerated non-obstructing large mass was found in the mid transverse colon. The mass was partially circumferential (involving two-thirds of the lumen circumference). The mass measured three cm in length. No bleeding was present. Subsequent biopsy on 06/05/16 revealed adenocarcinoma.  06/20/16: CT abd/pelvis with contrast: Mild focal wall thickening along the proximal/mid transverse colon in the right mid abdomen, possibly corresponding to the abnormality on colonoscopy, equivocal. No findings suspicious for metastatic disease in the abdomen/pelvis.  07/11/16: Partial colectomy by Dr. Arnoldo Morale. This confirmed adenocarcinoma of the colon, grade 2, spanning 3.8 cm. The tumor invaded through the muscularis propria into the pericolonic soft tissues. Additional lymphovascular invasion was identified. Margins of resection were negative.  Of 16 lymph nodes sampled, one was found positive for metastatic disease. All other lymph nodes negative.  08/04/16: CTA chest: No demonstrable pulmonary embolus. Ascending thoracic aortic diameter is 4.6 x 4.4 cm. No dissection evident. Ascending thoracic aortic aneurysm. Granuloma right base, calcified. Bibasilar atelectasis. Mild calcification along the posterior right apical pleural region without pleural thickening or mass associated. No adenopathy.  Scattered foci of aortic and coronary artery calcification. Status post aortic valve replacement.  Baseline CEA: 2.6    INTERVAL HISTORY:  Mr. Hilburn 83 y.o. male returns to cancer center for follow-up for colon cancer.   Due for cycle #11 FOLFOX chemotherapy today.   Overall, he tells me he has been feeling pretty well. Appetite 100%; energy levels 75%.  Denies any pain.  States that he did not have any diarrhea with his last cycle of chemo; he takes Imodium and Lomotil PRN which are both helpful.  Reports mild peripheral neuropathy to his fingertips, particularly his right hand. He is still able to button his shirt and perform ADLs "without any problem."    Endorses dark/black stools, "just about every time I have a bowel movement."  Denies any bright red blood in his stool that he has noticed. Denies any abd pain or N&V.    He has a "spot" on his abd. "I think it's a hair follicle, but I have an appt with Dr. Arnoldo Morale to look at it because when you've had cancer you don't take any chances."    Otherwise, he feels well and ready for next cycle of therapy today.     REVIEW OF SYSTEMS:  Review of Systems  Constitutional: Positive for fatigue (occasional). Negative for chills and fever.  HENT:  Negative.   Eyes: Negative.   Respiratory: Negative.   Cardiovascular: Negative.   Gastrointestinal: Negative for abdominal pain, constipation, diarrhea, nausea and vomiting.       + dark stools   Endocrine: Negative.   Genitourinary: Negative.  Negative for dysuria and hematuria.   Skin:       "Black spot on my belly."   Neurological: Positive for numbness.  Hematological: Negative.   Psychiatric/Behavioral: Negative.  PAST MEDICAL/SURGICAL HISTORY:  Past Medical History:  Diagnosis Date  . Chronic pulmonary embolism ([place]) 09/3708   compliction of the valve replacement surgery  . COPD (chronic obstructive pulmonary disease) ([place])   . Diabetes ([place]) 04/22/2016  . H/O aortic  valve replacement 10/2008  . Hypercholesteremia   . Hypertension   . Pulmonary emboli ([place]) 10/2008  . Sleep apnea    Past Surgical History:  Procedure Laterality Date  . AORTIC VALVE REPLACEMENT     2010  . CARDIAC CATHETERIZATION  2010  . COLONOSCOPY N/A 06/05/2016   Procedure: COLONOSCOPY;  Surgeon: Rogene Houston, MD;  Location: [facility];  Service: Endoscopy;  Laterality: N/A;  . PARTIAL COLECTOMY N/A 07/11/2016   Procedure: PARTIAL COLECTOMY;  Surgeon: Aviva Signs, MD;  Location: [facility] ORS;  Service: General;  Laterality: N/A;  . PORTACATH PLACEMENT N/A 08/13/2016   Procedure: INSERTION PORT-A-CATH LEFT SUBCLAVIAN;  Surgeon: Aviva Signs, MD;  Location: [facility] ORS;  Service: General;  Laterality: N/A;  . REPLACEMENT TOTAL KNEE     1996  . spenectomy     from trauma     SOCIAL HISTORY:  Social History   Social History  . Marital status: Widowed    Spouse name: N/A  . Number of children: N/A  . Years of education: N/A   Occupational History  . Not on file.   Social History Main Topics  . Smoking status: Former Smoker    Years: 15.00    Types: Cigarettes    Quit date: 1964  . Smokeless tobacco: Never Used  . Alcohol use No  . Drug use: No  . Sexual activity: Yes   Other Topics Concern  . Not on file   Social History Narrative  . No narrative on file    FAMILY HISTORY:  Family History  Problem Relation Age of Onset  . Colon cancer Neg Hx     CURRENT MEDICATIONS:  Outpatient Encounter Prescriptions as of 01/13/2017  Medication Sig Note  . aspirin EC 81 MG tablet Take 81 mg by mouth daily.   Marland Kitchen atorvastatin (LIPITOR) 10 MG tablet Take 10 mg by mouth at bedtime.    . Cholecalciferol (VITAMIN D3) 5000 units CAPS Take 5,000 Units by mouth daily.    . Diphenhyd-Hydrocort-Nystatin (FIRST-DUKES MOUTHWASH) SUSP Use as directed 5 mLs in the mouth or throat 4 (four) times daily as needed. (Patient not taking: Reported on 12/16/2016)   . diphenoxylate-atropine  (LOMOTIL) 2.5-0.025 MG tablet Take 1 tablet by mouth 4 (four) times daily as needed for diarrhea or loose stools. (Patient not taking: Reported on 12/16/2016)   . finasteride (PROSCAR) 5 MG tablet Take 5 mg by mouth at bedtime.    . fluorouracil CALGB 62694 in sodium chloride 0.9 % 150 mL Inject into the vein over 96 hr. Every 2 weeks, infusion pump for 46 hours   . leucovorin in dextrose 5 % 250 mL Inject into the vein once. Every 2 weeks   . lidocaine-prilocaine (EMLA) cream Apply to affected area once 08/05/2016: New Med   . lisinopril (PRINIVIL,ZESTRIL) 5 MG tablet Take 5 mg by mouth at bedtime.    . metFORMIN (GLUCOPHAGE) 500 MG tablet Take 500 mg by mouth 2 (two) times daily.   . metoprolol (LOPRESSOR) 50 MG tablet Take 50 mg by mouth 2 (two) times daily.   . ondansetron (ZOFRAN) 8 MG tablet Take 1 tablet (8 mg total) by mouth 2 (two) times daily as needed for refractory nausea / vomiting.  Start on day 3 after chemotherapy. (Patient not taking: Reported on 12/16/2016) 08/05/2016: New Med  . OXALIPLATIN IV Inject into the vein. Every 2 weeks   . prochlorperazine (COMPAZINE) 10 MG tablet Take 1 tablet (10 mg total) by mouth every 6 (six) hours as needed (Nausea or vomiting). (Patient not taking: Reported on 12/16/2016) 08/05/2016: New Med  . vitamin B-12 (CYANOCOBALAMIN) 1000 MCG tablet Take 1,000 mcg by mouth daily.     No facility-administered encounter medications on file as of 01/13/2017.     ALLERGIES:  Allergies  Allergen Reactions  . Amoxicillin Hives    Has patient had a PCN reaction causing immediate rash, facial/tongue/throat swelling, SOB or lightheadedness with hypotension: No Has patient had a PCN reaction causing severe rash involving mucus membranes or skin necrosis: Yes Has patient had a PCN reaction that required hospitalization No Has patient had a PCN reaction occurring within the last 10 years: No If all of the above answers are "NO", then may proceed with Cephalosporin  use.   . Tamsulosin Hcl     Cant sleep, confusion      PHYSICAL EXAM:  ECOG Performance status: 1-2 - Symptomatic; requires occasional assistance.      Physical Exam  Constitutional: He is oriented to person, place, and time and well-developed, well-nourished, and in no distress.  Seen in chemo chair in infusion area   HENT:  Head: Normocephalic.  Mouth/Throat: Oropharynx is clear and moist.  Eyes: Conjunctivae are normal. No scleral icterus.  Neck: Normal range of motion. Neck supple.  Cardiovascular: Normal rate and regular rhythm.   Pulmonary/Chest: Effort normal and breath sounds normal. No respiratory distress.  Abdominal: Soft. Bowel sounds are normal. There is no tenderness.  Musculoskeletal: Normal range of motion. He exhibits no edema.  Lymphadenopathy:    He has no cervical adenopathy.       Right: No supraclavicular adenopathy present.       Left: No supraclavicular adenopathy present.  Neurological: He is alert and oriented to person, place, and time. No cranial nerve deficit.  Skin: Skin is warm and dry. No rash noted.  Very small lesion to skin on lower abd; appears to be comedone. Does not appear suspicious for malignancy; provided reassurance to patient.   Psychiatric: Mood, memory, affect and judgment normal.  Nursing note and vitals reviewed.    LABORATORY DATA:  I have reviewed the labs as listed.  CBC    Component Value Date/Time   WBC 7.7 01/13/2017 0839   RBC 3.36 (L) 01/13/2017 0839   HGB 11.9 (L) 01/13/2017 0839   HCT 35.6 (L) 01/13/2017 0839   PLT 151 01/13/2017 0839   MCV 106.0 (H) 01/13/2017 0839   MCH 35.4 (H) 01/13/2017 0839   MCHC 33.4 01/13/2017 0839   RDW 13.9 01/13/2017 0839   LYMPHSABS 2.2 01/13/2017 0839   MONOABS 1.3 (H) 01/13/2017 0839   EOSABS 0.2 01/13/2017 0839   BASOSABS 0.0 01/13/2017 0839   CMP Latest Ref Rng & Units 12/30/2016 12/16/2016 12/02/2016  Glucose 65 - 99 mg/dL 181(H) 124(H) 206(H)  BUN 6 - 20 mg/dL 8 8 11    Creatinine 0.61 - 1.24 mg/dL 0.73 0.86 0.76  Sodium 135 - 145 mmol/L 136 139 134(L)  Potassium 3.5 - 5.1 mmol/L 4.2 4.0 3.8  Chloride 101 - 111 mmol/L 101 105 101  CO2 22 - 32 mmol/L 27 26 25   Calcium 8.9 - 10.3 mg/dL 9.0 8.8(L) 9.0  Total Protein 6.5 - 8.1 g/dL 6.8 6.8 6.8  Total Bilirubin 0.3 - 1.2 mg/dL 0.6 0.5 0.7  Alkaline Phos 38 - 126 U/L 54 44 42  AST 15 - 41 U/L 32 31 26  ALT 17 - 63 U/L 24 24 20     PENDING LABS:    DIAGNOSTIC IMAGING:  *The following radiologic images and reports have been reviewed independently and agree with below findings.  CT abd/pelvis: 07/14/16 CLINICAL DATA:  Post RIGHT hemicolectomy on 07/11/2016 for colon cancer, vomiting  EXAM: CT ABDOMEN AND PELVIS WITH CONTRAST  TECHNIQUE: Multidetector CT imaging of the abdomen and pelvis was performed using the standard protocol following bolus administration of intravenous contrast. Sagittal and coronal MPR images reconstructed from axial data set.  CONTRAST:  74mL ISOVUE-300 IOPAMIDOL (ISOVUE-300) INJECTION 61% PO, 18mL ISOVUE-300 IOPAMIDOL (ISOVUE-300) INJECTION 61% IV  COMPARISON:  06/20/2016  FINDINGS: Lower chest: Bibasilar atelectasis  Hepatobiliary: Few beam hardening artifacts from ribs traverse liver. Suspected tiny dependent gallstones. No definite biliary dilatation.  Pancreas: Normal appearance  Spleen: Surgically absent. Probable small splenule LEFT upper quadrant 19 x 13 mm image 21.  Adrenals/Urinary Tract: Adrenal glands, kidneys, ureters, and bladder normal appearance  Stomach/Bowel: Ileocolic anastomosis at the proximal to mid transverse colon. Diffuse dilatation of small bowel loops and stomach likely reflecting ileus. No evidence of gastric outlet obstruction. Mild bowel wall thickening of the distal ileum at the anastomosis consistent with recent surgery. Sigmoid diverticulosis. Remaining residual colon unremarkable.  Vascular/Lymphatic: Scattered  pelvic phleboliths. Scattered atherosclerotic calcifications without aneurysm. IVC filter noted. No definite adenopathy.  Reproductive: Enlarged prostate gland 6.7 x 5.5 x 5.8 cm. Seminal vesicles unremarkable.  Other: Low-attenuation free primarily in the RIGHT gutter and perihepatic region though a small amount is also present in the pelvis dependently and interloop. Free intraperitoneal air greatest in RIGHT upper quadrant. These are likely related to recent surgery. No hernia.  Musculoskeletal: Osteoarthritic changes of the RIGHT hip joint. Scattered degenerative disc and facet disease changes lower lumbar spine.  IMPRESSION: Dilated stomach and small bowel loops question postoperative ileus.  Free air and free fluid likely related to recent surgery.  Distal colonic diverticulosis.  Bibasilar atelectasis.  Prostatic enlargement.   Electronically Signed   By: Lavonia Dana M.D.   On: 07/14/2016 11:24     PATHOLOGY:  Colon resection surgical path: 07/11/16              ASSESSMENT & PLAN:   Stage IIIB adenocarcinoma of colon:  -Diagnosed in 05/2016 during colonoscopy with Dr. Laural Golden. Baseline CEA normal at 2.6.  CT abd/pelvis did not reveal any evidence of metastatic disease.  Colon surgical resection performed by Dr. Arnoldo Morale on 07/11/16. Started chemotherapy with FOLFOX on 08/26/16.  -Due for cycle #11 FOLFOX today.  5-FU (both bolus and continuous dose) were dose-reduced with cycle #5 going forward.  Labs reviewed and are adequate for treatment today.   -Last CEA mildly elevated at 6.2 on 12/30/16. Will recheck with cycle #12 chemo.  Shared with him that his CEA was elevated and there are many non-malignant reasons for CEA to be elevated. Upcoming estaging imaging may give [place] more indication if elevated CEA is indicative of progressive disease.  -Will plan to obtain restaging imaging with CT chest/abd/pelvis 1-2 weeks after cycle #12; orders placed today.    -Return to cancer center in 1 month for follow-up.   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 Does not interfere with ADLs.  Will continue treatment at full-dose.  Dark stools/Iron deficiency anemia:  -History of iron deficiency anemia, like d/t malignancy.  -Reporting regular dark stools. Hgb 11.9 g/dL today.  Will add-on iron studies to see if he may benefit from IV iron infusion.    Diarrhea:  -Improved with Imodium and Lomotil. Continue PRN.  -Encouraged him to continue to push fluids as tolerated.       Dispo:  -Continue FOLFOX chemo.  -Restaging CT chest/abd/pelvis due in ~1 month after cycle #12 chemo.  -Return to cancer center for follow-up in ~1 month with port flush/labs.     All questions were answered to patient's stated satisfaction. Encouraged patient to call with any new concerns or questions before his next visit to the cancer center and we can certain see him sooner, if needed.    Plan of care discussed with Dr. Talbert Cage, who agrees with the above aforementioned.    Orders placed this encounter:  Orders Placed This Encounter  Procedures  . CT Chest W Contrast  . CT Abdomen Pelvis W Contrast  . Ferritin  . Iron and TIBC      Mike Craze, NP [place] [phone]

## 2017-01-13 NOTE — Patient Instructions (Addendum)
[place] at [facility] Discharge Instructions  RECOMMENDATIONS MADE BY THE CONSULTANT AND ANY TEST RESULTS WILL BE SENT TO YOUR REFERRING PHYSICIAN.  You were seen today by Mike Craze, NP You will get treatment today Continue treatments every 2 weeks You will have CT scans prior to next visit Follow up in 4 weeks with Elzie Rings See schedulers up front for appointments   Thank you for choosing [place] at [facility] to provide your oncology and hematology care.  To afford each patient quality time with our provider, please arrive at least 15 minutes before your scheduled appointment time.    If you have a lab appointment with the [facility] please come in thru the  Main Entrance and check in at the main information desk  You need to re-schedule your appointment should you arrive 10 or more minutes late.  We strive to give you quality time with our providers, and arriving late affects you and other patients whose appointments are after yours.  Also, if you no show three or more times for appointments you may be dismissed from the clinic at the providers discretion.     Again, thank you for choosing [facility].  Our hope is that these requests will decrease the amount of time that you wait before being seen by our physicians.       _____________________________________________________________  Should you have questions after your visit to [facility], please contact our office at [phone] between the hours of 8:30 a.m. and 4:30 p.m.  Voicemails left after 4:30 p.m. will not be returned until the following business day.  For prescription refill requests, have your pharmacy contact our office.       Resources For Cancer Patients and their Caregivers ? American Cancer Society: Can assist with transportation, wigs, general needs, runs Look Good Feel Better.        848-038-8166 ? Cancer  Care: Provides financial assistance, online support groups, medication/co-pay assistance.  1-800-813-HOPE 720-236-7974) ? [place] Assists [place] cancer patients and their families through emotional , educational and financial support.  762-471-1116 ? [place] DSS Where to apply for food stamps, Medicaid and utility assistance. 701-578-9935 ? RCATS: Transportation to medical appointments. 252-365-6976 ? Social Security Administration: May apply for disability if have a Stage IV cancer. 352-363-7371 570-434-5205 ? [employer], Disability and Transit Services: Assists with nutrition, care and transit needs. [place] Support Programs: @10RELATIVEDAYS @ > Cancer Support Group  2nd Tuesday of the month 1pm-2pm, Journey Room  > Creative Journey  3rd Tuesday of the month 1130am-1pm, Journey Room  > Look Good Feel Better  1st Wednesday of the month 10am-12 noon, Journey Room (Call [place] to register [phone])

## 2017-01-13 NOTE — Progress Notes (Signed)
Labs reviewed with MD, proceed with treatment.   Continuous 5FU pump connected.  Treatment given per orders. Patient tolerated it well without problems. Vitals stable and discharged home from clinic ambulatory. Follow up as scheduled.

## 2017-01-15 ENCOUNTER — Encounter (HOSPITAL_BASED_OUTPATIENT_CLINIC_OR_DEPARTMENT_OTHER): Payer: Medicare Other

## 2017-01-15 ENCOUNTER — Encounter: Payer: Self-pay | Admitting: General Surgery

## 2017-01-15 ENCOUNTER — Ambulatory Visit (INDEPENDENT_AMBULATORY_CARE_PROVIDER_SITE_OTHER): Payer: Self-pay | Admitting: General Surgery

## 2017-01-15 ENCOUNTER — Encounter (HOSPITAL_COMMUNITY): Payer: Self-pay

## 2017-01-15 ENCOUNTER — Ambulatory Visit (HOSPITAL_COMMUNITY): Payer: Medicare Other

## 2017-01-15 VITALS — BP 125/67 | HR 83 | Temp 98.0°F | Resp 18

## 2017-01-15 VITALS — BP 164/69 | HR 91 | Temp 97.7°F | Resp 18 | Ht 73.0 in | Wt 204.0 lb

## 2017-01-15 DIAGNOSIS — Z09 Encounter for follow-up examination after completed treatment for conditions other than malignant neoplasm: Secondary | ICD-10-CM

## 2017-01-15 DIAGNOSIS — Z452 Encounter for adjustment and management of vascular access device: Secondary | ICD-10-CM

## 2017-01-15 DIAGNOSIS — C184 Malignant neoplasm of transverse colon: Secondary | ICD-10-CM

## 2017-01-15 MED ORDER — SODIUM CHLORIDE 0.9% FLUSH
10.0000 mL | INTRAVENOUS | Status: DC | PRN
  Administered 2017-01-15: 10 mL
  Filled 2017-01-15: qty 10

## 2017-01-15 MED ORDER — HEPARIN SOD (PORK) LOCK FLUSH 100 UNIT/ML IV SOLN
500.0000 [IU] | Freq: Once | INTRAVENOUS | Status: AC | PRN
  Administered 2017-01-15: 500 [IU]

## 2017-01-15 NOTE — Progress Notes (Signed)
Kevin Kirk presents to have home infusion pump d/c'd and for port-a-cath deaccess with flush.  Portacath located left chest wall accessed with  H 20 needle.  Good blood return present. Portacath flushed with NS and 500U/5ml Heparin, and needle removed intact.  Procedure tolerated well and without incident.  Discharged ambulatory.  

## 2017-01-15 NOTE — Progress Notes (Signed)
Subjective:     Kevin Kirk  Status post colectomy in the past for colon cancer. Has small lump on the upper part of the abdomen. Objective:    BP (!) 164/69   Pulse 91   Temp 97.7 F (36.5 C)   Resp 18   Ht 6\' 1"  (1.854 m)   Wt 204 lb (92.5 kg)   BMI 26.91 kg/m   General:  alert, cooperative and no distress  Abdomen: Small less than 5 mm cyst with punctum present. No erythema or drainage noted.     Assessment:    Small ingrown hair of abdominal wall, not complicated.    Plan:   No need for excision at this time. Patient understands and agrees. Follow-up as needed.

## 2017-01-27 ENCOUNTER — Encounter (HOSPITAL_COMMUNITY): Payer: Medicare Other | Attending: Oncology

## 2017-01-27 ENCOUNTER — Encounter (HOSPITAL_COMMUNITY): Payer: Self-pay

## 2017-01-27 VITALS — BP 133/80 | HR 73 | Temp 97.6°F | Resp 18 | Wt 203.8 lb

## 2017-01-27 DIAGNOSIS — C184 Malignant neoplasm of transverse colon: Secondary | ICD-10-CM | POA: Insufficient documentation

## 2017-01-27 DIAGNOSIS — D509 Iron deficiency anemia, unspecified: Secondary | ICD-10-CM | POA: Diagnosis present

## 2017-01-27 DIAGNOSIS — Z5111 Encounter for antineoplastic chemotherapy: Secondary | ICD-10-CM

## 2017-01-27 LAB — COMPREHENSIVE METABOLIC PANEL
ALT: 24 U/L (ref 17–63)
AST: 32 U/L (ref 15–41)
Albumin: 3.6 g/dL (ref 3.5–5.0)
Alkaline Phosphatase: 50 U/L (ref 38–126)
Anion gap: 7 (ref 5–15)
BUN: 9 mg/dL (ref 6–20)
CO2: 28 mmol/L (ref 22–32)
Calcium: 9 mg/dL (ref 8.9–10.3)
Chloride: 103 mmol/L (ref 101–111)
Creatinine, Ser: 0.69 mg/dL (ref 0.61–1.24)
GFR calc Af Amer: >60 mL/min (ref >60)
GFR calc non Af Amer: >60 mL/min (ref >60)
Glucose, Bld: 179 mg/dL — ABNORMAL HIGH (ref 65–99)
Potassium: 3.9 mmol/L (ref 3.5–5.1)
Sodium: 138 mmol/L (ref 135–145)
Total Bilirubin: 0.7 mg/dL (ref 0.3–1.2)
Total Protein: 6.7 g/dL (ref 6.5–8.1)

## 2017-01-27 LAB — CBC WITH DIFFERENTIAL/PLATELET
Basophils Absolute: 0 10*3/uL (ref 0.0–0.1)
Basophils Relative: 0 %
Eosinophils Absolute: 0.2 10*3/uL (ref 0.0–0.7)
Eosinophils Relative: 2 %
HCT: 36.1 % — ABNORMAL LOW (ref 39.0–52.0)
Hemoglobin: 11.8 g/dL — ABNORMAL LOW (ref 13.0–17.0)
Lymphocytes Relative: 31 %
Lymphs Abs: 2.6 10*3/uL (ref 0.7–4.0)
MCH: 34.8 pg — ABNORMAL HIGH (ref 26.0–34.0)
MCHC: 32.7 g/dL (ref 30.0–36.0)
MCV: 106.5 fL — ABNORMAL HIGH (ref 78.0–100.0)
Monocytes Absolute: 1.6 10*3/uL — ABNORMAL HIGH (ref 0.1–1.0)
Monocytes Relative: 19 %
Neutro Abs: 4.1 10*3/uL (ref 1.7–7.7)
Neutrophils Relative %: 48 %
Platelets: 153 10*3/uL (ref 150–400)
RBC: 3.39 MIL/uL — ABNORMAL LOW (ref 4.22–5.81)
RDW: 14.4 % (ref 11.5–15.5)
WBC: 8.5 10*3/uL (ref 4.0–10.5)

## 2017-01-27 MED ORDER — DEXAMETHASONE SODIUM PHOSPHATE 10 MG/ML IJ SOLN
INTRAMUSCULAR | Status: AC
  Filled 2017-01-27: qty 1

## 2017-01-27 MED ORDER — SODIUM CHLORIDE 0.9 % IV SOLN
1800.0000 mg/m2 | INTRAVENOUS | Status: DC
  Administered 2017-01-27: 3900 mg via INTRAVENOUS
  Filled 2017-01-27: qty 78

## 2017-01-27 MED ORDER — PALONOSETRON HCL INJECTION 0.25 MG/5ML
INTRAVENOUS | Status: AC
  Filled 2017-01-27: qty 5

## 2017-01-27 MED ORDER — DEXAMETHASONE SODIUM PHOSPHATE 10 MG/ML IJ SOLN
10.0000 mg | Freq: Once | INTRAMUSCULAR | Status: AC
  Administered 2017-01-27: 10 mg via INTRAVENOUS

## 2017-01-27 MED ORDER — SODIUM CHLORIDE 0.9% FLUSH
10.0000 mL | INTRAVENOUS | Status: DC | PRN
  Administered 2017-01-27: 10 mL
  Filled 2017-01-27: qty 10

## 2017-01-27 MED ORDER — PALONOSETRON HCL INJECTION 0.25 MG/5ML
0.2500 mg | Freq: Once | INTRAVENOUS | Status: AC
  Administered 2017-01-27: 0.25 mg via INTRAVENOUS

## 2017-01-27 MED ORDER — DEXTROSE 5 % IV SOLN
Freq: Once | INTRAVENOUS | Status: AC
  Administered 2017-01-27: 09:00:00 via INTRAVENOUS

## 2017-01-27 MED ORDER — FLUOROURACIL CHEMO INJECTION 2.5 GM/50ML
300.0000 mg/m2 | Freq: Once | INTRAVENOUS | Status: AC
  Administered 2017-01-27: 650 mg via INTRAVENOUS
  Filled 2017-01-27: qty 13

## 2017-01-27 MED ORDER — DEXTROSE 5 % IV SOLN
68.0000 mg/m2 | Freq: Once | INTRAVENOUS | Status: AC
  Administered 2017-01-27: 150 mg via INTRAVENOUS
  Filled 2017-01-27: qty 20

## 2017-01-27 MED ORDER — LEUCOVORIN CALCIUM INJECTION 350 MG
413.0000 mg/m2 | Freq: Once | INTRAVENOUS | Status: AC
  Administered 2017-01-27: 900 mg via INTRAVENOUS
  Filled 2017-01-27: qty 10

## 2017-01-27 NOTE — Progress Notes (Signed)
To treatment area for chemotherapy.  Patient stated new symptom was numbness on the bottom of his feet since his last treatment.  Denied trips or falls.  Patient stated he can still open jar lids, some difficulty with smaller buttons on shirts, and can work zippers.  Good appetite.  No pain for today.    Reviewed labs and new symptom of numbness on the bottom on the patient feet with Dr. Talbert Cage and ok to treat today.    Patient tolerated chemotherapy with no complaints voiced.  Chemotherapy pump 5FU running with no alarms and no complaints voiced.  Port site clean and dry with no bruising or swelling noted at site.  VSS with discharge and patient instructed when to return for pump removal.  No s/s of distress and left ambulatory with friend.

## 2017-01-27 NOTE — Patient Instructions (Signed)
[place] Discharge Instructions for Patients Receiving Chemotherapy  Today you received the following chemotherapy agents oxaliplatin, leucovorin, 5Fu pump.      If you develop nausea and vomiting that is not controlled by your nausea medication, call the clinic.   BELOW ARE SYMPTOMS THAT SHOULD BE REPORTED IMMEDIATELY:  *FEVER GREATER THAN 100.5 F  *CHILLS WITH OR WITHOUT FEVER  NAUSEA AND VOMITING THAT IS NOT CONTROLLED WITH YOUR NAUSEA MEDICATION  *UNUSUAL SHORTNESS OF BREATH  *UNUSUAL BRUISING OR BLEEDING  TENDERNESS IN MOUTH AND THROAT WITH OR WITHOUT PRESENCE OF ULCERS  *URINARY PROBLEMS  *BOWEL PROBLEMS  UNUSUAL RASH Items with * indicate a potential emergency and should be followed up as soon as possible.  Feel free to call the clinic should you have any questions or concerns. The clinic phone number is [phone].  Please show the [place] at check-in to the Emergency Department and triage nurse.

## 2017-01-29 ENCOUNTER — Encounter (HOSPITAL_COMMUNITY): Payer: Self-pay

## 2017-01-29 ENCOUNTER — Encounter (HOSPITAL_BASED_OUTPATIENT_CLINIC_OR_DEPARTMENT_OTHER): Payer: Medicare Other

## 2017-01-29 VITALS — BP 139/76 | HR 84 | Temp 98.6°F | Resp 16

## 2017-01-29 DIAGNOSIS — C184 Malignant neoplasm of transverse colon: Secondary | ICD-10-CM

## 2017-01-29 MED ORDER — HEPARIN SOD (PORK) LOCK FLUSH 100 UNIT/ML IV SOLN
500.0000 [IU] | Freq: Once | INTRAVENOUS | Status: AC | PRN
  Administered 2017-01-29: 500 [IU]

## 2017-01-29 MED ORDER — SODIUM CHLORIDE 0.9% FLUSH
10.0000 mL | INTRAVENOUS | Status: DC | PRN
  Administered 2017-01-29: 10 mL
  Filled 2017-01-29: qty 10

## 2017-01-29 NOTE — Patient Instructions (Signed)
[place] at [facility] Discharge Instructions  RECOMMENDATIONS MADE BY THE CONSULTANT AND ANY TEST RESULTS WILL BE SENT TO YOUR REFERRING PHYSICIAN.  Continuous 41fu pump disconnected Follow up as scheduled.  Thank you for choosing [place] at [facility] to provide your oncology and hematology care.  To afford each patient quality time with our provider, please arrive at least 15 minutes before your scheduled appointment time.    If you have a lab appointment with the [facility] please come in thru the  Main Entrance and check in at the main information desk  You need to re-schedule your appointment should you arrive 10 or more minutes late.  We strive to give you quality time with our providers, and arriving late affects you and other patients whose appointments are after yours.  Also, if you no show three or more times for appointments you may be dismissed from the clinic at the providers discretion.     Again, thank you for choosing [facility].  Our hope is that these requests will decrease the amount of time that you wait before being seen by our physicians.       _____________________________________________________________  Should you have questions after your visit to [facility], please contact our office at [phone] between the hours of 8:30 a.m. and 4:30 p.m.  Voicemails left after 4:30 p.m. will not be returned until the following business day.  For prescription refill requests, have your pharmacy contact our office.       Resources For Cancer Patients and their Caregivers ? American Cancer Society: Can assist with transportation, wigs, general needs, runs Look Good Feel Better.        724-359-9842 ? Cancer Care: Provides financial assistance, online support groups, medication/co-pay assistance.  1-800-813-HOPE 214-329-5650) ? [place] Assists [place] cancer  patients and their families through emotional , educational and financial support.  838-453-1612 ? [place] DSS Where to apply for food stamps, Medicaid and utility assistance. 914-409-0710 ? RCATS: Transportation to medical appointments. 514-465-3709 ? Social Security Administration: May apply for disability if have a Stage IV cancer. 539-847-4683 (252) 104-8079 ? [employer], Disability and Transit Services: Assists with nutrition, care and transit needs. [place] Support Programs: @10RELATIVEDAYS @ > Cancer Support Group  2nd Tuesday of the month 1pm-2pm, Journey Room  > Creative Journey  3rd Tuesday of the month 1130am-1pm, Journey Room  > Look Good Feel Better  1st Wednesday of the month 10am-12 noon, Journey Room (Call [place] to register [phone])

## 2017-01-29 NOTE — Progress Notes (Signed)
Kevin Kirk returns today for port de access and flush after 46 hr continous infusion of 54fu. Tolerated infusion without problems. Portacath located leftchest wall was  deaccessed and flushed with 56ml NS and 500U/59ml Heparin and needle removed intact.  Procedure without incident. Patient tolerated procedure well  Treatment given per orders. Patient tolerated it well without problems. Vitals stable and discharged home from clinic ambulatory. Follow up as scheduled.

## 2017-02-06 ENCOUNTER — Ambulatory Visit (HOSPITAL_COMMUNITY)
Admission: RE | Admit: 2017-02-06 | Discharge: 2017-02-06 | Disposition: A | Discharge status: Home / Self Care | Payer: Medicare Other | Source: Ambulatory Visit | Attending: Adult Health | Admitting: Adult Health

## 2017-02-06 ENCOUNTER — Encounter (HOSPITAL_COMMUNITY): Payer: Self-pay

## 2017-02-06 DIAGNOSIS — K8020 Calculus of gallbladder without cholecystitis without obstruction: Secondary | ICD-10-CM | POA: Diagnosis not present

## 2017-02-06 DIAGNOSIS — C184 Malignant neoplasm of transverse colon: Secondary | ICD-10-CM | POA: Insufficient documentation

## 2017-02-06 DIAGNOSIS — N281 Cyst of kidney, acquired: Secondary | ICD-10-CM | POA: Diagnosis not present

## 2017-02-06 MED ORDER — IOPAMIDOL (ISOVUE-300) INJECTION 61%
100.0000 mL | Freq: Once | INTRAVENOUS | Status: AC | PRN
  Administered 2017-02-06: 100 mL via INTRAVENOUS

## 2017-02-10 ENCOUNTER — Ambulatory Visit (HOSPITAL_COMMUNITY): Payer: Medicare Other

## 2017-02-11 ENCOUNTER — Other Ambulatory Visit: Payer: Self-pay

## 2017-02-11 ENCOUNTER — Encounter (HOSPITAL_COMMUNITY): Payer: Self-pay | Admitting: Oncology

## 2017-02-11 ENCOUNTER — Encounter (HOSPITAL_BASED_OUTPATIENT_CLINIC_OR_DEPARTMENT_OTHER): Payer: Medicare Other | Admitting: Oncology

## 2017-02-11 ENCOUNTER — Encounter (HOSPITAL_BASED_OUTPATIENT_CLINIC_OR_DEPARTMENT_OTHER): Payer: Medicare Other

## 2017-02-11 VITALS — BP 155/70 | HR 70 | Temp 97.7°F | Resp 18 | Wt 200.4 lb

## 2017-02-11 DIAGNOSIS — Z95828 Presence of other vascular implants and grafts: Secondary | ICD-10-CM

## 2017-02-11 DIAGNOSIS — G620 Drug-induced polyneuropathy: Secondary | ICD-10-CM | POA: Diagnosis not present

## 2017-02-11 DIAGNOSIS — C184 Malignant neoplasm of transverse colon: Secondary | ICD-10-CM

## 2017-02-11 DIAGNOSIS — K521 Toxic gastroenteritis and colitis: Secondary | ICD-10-CM

## 2017-02-11 LAB — COMPREHENSIVE METABOLIC PANEL
ALT: 27 U/L (ref 17–63)
AST: 37 U/L (ref 15–41)
Albumin: 3.8 g/dL (ref 3.5–5.0)
Alkaline Phosphatase: 49 U/L (ref 38–126)
Anion gap: 7 (ref 5–15)
BUN: 12 mg/dL (ref 6–20)
CO2: 26 mmol/L (ref 22–32)
Calcium: 9.3 mg/dL (ref 8.9–10.3)
Chloride: 105 mmol/L (ref 101–111)
Creatinine, Ser: 0.73 mg/dL (ref 0.61–1.24)
GFR calc Af Amer: >60 mL/min (ref >60)
GFR calc non Af Amer: >60 mL/min (ref >60)
Glucose, Bld: 111 mg/dL — ABNORMAL HIGH (ref 65–99)
Potassium: 4 mmol/L (ref 3.5–5.1)
Sodium: 138 mmol/L (ref 135–145)
Total Bilirubin: 0.8 mg/dL (ref 0.3–1.2)
Total Protein: 7.2 g/dL (ref 6.5–8.1)

## 2017-02-11 LAB — CBC WITH DIFFERENTIAL/PLATELET
Basophils Absolute: 0 10*3/uL (ref 0.0–0.1)
Basophils Relative: 0 %
Eosinophils Absolute: 0.1 10*3/uL (ref 0.0–0.7)
Eosinophils Relative: 2 %
HCT: 38.7 % — ABNORMAL LOW (ref 39.0–52.0)
Hemoglobin: 12.2 g/dL — ABNORMAL LOW (ref 13.0–17.0)
Lymphocytes Relative: 43 %
Lymphs Abs: 3.7 10*3/uL (ref 0.7–4.0)
MCH: 34.3 pg — ABNORMAL HIGH (ref 26.0–34.0)
MCHC: 31.5 g/dL (ref 30.0–36.0)
MCV: 108.7 fL — ABNORMAL HIGH (ref 78.0–100.0)
Monocytes Absolute: 1.3 10*3/uL — ABNORMAL HIGH (ref 0.1–1.0)
Monocytes Relative: 15 %
Neutro Abs: 3.4 10*3/uL (ref 1.7–7.7)
Neutrophils Relative %: 40 %
Platelets: 143 10*3/uL — ABNORMAL LOW (ref 150–400)
RBC: 3.56 MIL/uL — ABNORMAL LOW (ref 4.22–5.81)
RDW: 14.3 % (ref 11.5–15.5)
WBC: 8.5 10*3/uL (ref 4.0–10.5)

## 2017-02-11 MED ORDER — HEPARIN SOD (PORK) LOCK FLUSH 100 UNIT/ML IV SOLN
500.0000 [IU] | Freq: Once | INTRAVENOUS | Status: AC
  Administered 2017-02-11: 500 [IU] via INTRAVENOUS

## 2017-02-11 MED ORDER — SODIUM CHLORIDE 0.9% FLUSH
10.0000 mL | INTRAVENOUS | Status: DC | PRN
  Administered 2017-02-11: 10 mL via INTRAVENOUS
  Filled 2017-02-11: qty 10

## 2017-02-11 NOTE — Patient Instructions (Signed)
[place] at [facility] Discharge Instructions  RECOMMENDATIONS MADE BY THE CONSULTANT AND ANY TEST RESULTS WILL BE SENT TO YOUR REFERRING PHYSICIAN.  You were seen today by Dr. Twana First We will get you a new schedule  Teaching will be done at bedside when you come back for new treatment   Thank you for choosing [place] at [facility] to provide your oncology and hematology care.  To afford each patient quality time with our provider, please arrive at least 15 minutes before your scheduled appointment time.    If you have a lab appointment with the [facility] please come in thru the  Main Entrance and check in at the main information desk  You need to re-schedule your appointment should you arrive 10 or more minutes late.  We strive to give you quality time with our providers, and arriving late affects you and other patients whose appointments are after yours.  Also, if you no show three or more times for appointments you may be dismissed from the clinic at the providers discretion.     Again, thank you for choosing [facility].  Our hope is that these requests will decrease the amount of time that you wait before being seen by our physicians.       _____________________________________________________________  Should you have questions after your visit to [facility], please contact our office at [phone] between the hours of 8:30 a.m. and 4:30 p.m.  Voicemails left after 4:30 p.m. will not be returned until the following business day.  For prescription refill requests, have your pharmacy contact our office.       Resources For Cancer Patients and their Caregivers ? American Cancer Society: Can assist with transportation, wigs, general needs, runs Look Good Feel Better.        305 762 9776 ? Cancer Care: Provides financial assistance, online support groups, medication/co-pay assistance.   1-800-813-HOPE 609-268-3280) ? [facility] Assists [place] cancer patients and their families through emotional , educational and financial support.  (332)317-9421 ? [place] DSS Where to apply for food stamps, Medicaid and utility assistance. (564)370-7341 ? [employer]: Transportation to medical appointments. (615) 833-0633 ? Social Security Administration: May apply for disability if have a Stage IV cancer. (340)846-4708 938-789-0699 ? [employer], Disability and Transit Services: Assists with nutrition, care and transit needs. [place] Support Programs: @10RELATIVEDAYS @ > Cancer Support Group  2nd Tuesday of the month 1pm-2pm, Journey Room  > Creative Journey  3rd Tuesday of the month 1130am-1pm, Journey Room  > Look Good Feel Better  1st Wednesday of the month 10am-12 noon, Journey Room (Call [place] to register [phone])

## 2017-02-11 NOTE — Progress Notes (Signed)
Kevin Kirk presented for [employer] and flush.  Proper placement of portacath confirmed by CXR. Portacath located left chest wall accessed with  H 20 needle.  Good blood return present. Portacath flushed with 30ml NS and 500U/80ml Heparin and needle removed intact.  Procedure tolerated well and without incident.  Discharged ambulatory.

## 2017-02-11 NOTE — Progress Notes (Addendum)
[facility] [address], [place] [address]   CLINIC:  Medical Oncology/Hematology  PCP:  Tobe Sos, MD [address] [place] [address] [phone]   REASON FOR VISIT:  Follow-up for Stage IIIB adenocarcinoma of colon   CURRENT THERAPY: FOLFOX, beginning 08/26/16    HISTORY OF PRESENT ILLNESS:  Kevin Kirk 83 y.o. male is here because of referral by Dr. Aviva Signs. The patient initially noticed bright red blood in his stool in December 2017.   The patient underwent colonoscopy on 06/05/16 with Dr. Laural Golden which showed a fungating, polypoid and ulcerated non-obstructing large mass was found in the mid transverse colon. The mass was partially circumferential (involving two-thirds of the lumen circumference). The mass measured three cm in length. No bleeding was present. Subsequent biopsy on 06/05/16 revealed adenocarcinoma.  06/20/16: CT abd/pelvis with contrast: Mild focal wall thickening along the proximal/mid transverse colon in the right mid abdomen, possibly corresponding to the abnormality on colonoscopy, equivocal. No findings suspicious for metastatic disease in the abdomen/pelvis.  07/11/16: Partial colectomy by Dr. Arnoldo Morale. This confirmed adenocarcinoma of the colon, grade 2, spanning 3.8 cm. The tumor invaded through the muscularis propria into the pericolonic soft tissues. Additional lymphovascular invasion was identified. Margins of resection were negative.  Of 16 lymph nodes sampled, one was found positive for metastatic disease. All other lymph nodes negative.  08/04/16: CTA chest: No demonstrable pulmonary embolus. Ascending thoracic aortic diameter is 4.6 x 4.4 cm. No dissection evident. Ascending thoracic aortic aneurysm. Granuloma right base, calcified. Bibasilar atelectasis. Mild calcification along the posterior right apical pleural region without pleural thickening or mass associated. No adenopathy. Scattered foci of aortic and  coronary artery calcification. Status post aortic valve replacement.  Baseline CEA: 2.6    INTERVAL HISTORY:  Mr. Kevin Kirk 83 y.o. male returns to cancer center for follow-up for colon cancer.   Patient presents with his daughter in law today for follow-up and review of his restaging scans.  He states overall he has been doing well.  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  He states his appetite is good denies any weight loss.  He denies any chest pain, shortness breath, abdominal pain, nausea, vomiting.  He states his occasional diarrhea postchemotherapy is controlled with Lomotil.  REVIEW OF SYSTEMS:  Review of Systems  Constitutional: Negative for chills, fatigue and fever.  HENT:  Negative.  Negative for mouth sores.   Eyes: Negative.   Respiratory: Negative.  Negative for cough and shortness of breath.   Cardiovascular: Negative.   Gastrointestinal: Positive for diarrhea.  Endocrine: Negative.   Genitourinary: Negative.  Negative for dysuria and hematuria.   Musculoskeletal: Negative for back pain and neck pain.  Neurological: Positive for numbness.  Hematological: Negative.   Psychiatric/Behavioral: Negative.      PAST MEDICAL/SURGICAL HISTORY:  Past Medical History:  Diagnosis Date  . Chronic pulmonary embolism ([place]) 93/7169   compliction of the valve replacement surgery  . COPD (chronic obstructive pulmonary disease) ([place])   . Diabetes ([place]) 04/22/2016  . H/O aortic valve replacement 10/2008  . Hypercholesteremia   . Hypertension   . Pulmonary emboli ([place]) 10/2008  . Sleep apnea    Past Surgical History:  Procedure Laterality Date  . AORTIC VALVE REPLACEMENT     2010  . CARDIAC CATHETERIZATION  2010  . COLONOSCOPY N/A 06/05/2016   Procedure: COLONOSCOPY;  Surgeon: Rogene Houston, MD;  [facility]  [facility];  Service: Endoscopy;  Laterality: N/A;  . PARTIAL COLECTOMY N/A 07/11/2016   Procedure:  PARTIAL COLECTOMY;  Surgeon: Aviva Signs, MD;  Location: [facility] ORS;  Service: General;  Laterality: N/A;  . PORTACATH PLACEMENT N/A 08/13/2016   Procedure: INSERTION PORT-A-CATH LEFT SUBCLAVIAN;  Surgeon: Aviva Signs, MD;  Location: [facility] ORS;  Service: General;  Laterality: N/A;  . REPLACEMENT TOTAL KNEE     1996  . spenectomy     from trauma     SOCIAL HISTORY:  Social History   Socioeconomic History  . Marital status: Widowed    Spouse name: Not on file  . Number of children: Not on file  . Years of education: Not on file  . Highest education level: Not on file  Social Needs  . Financial resource strain: Not on file  . Food insecurity - worry: Not on file  . Food insecurity - inability: Not on file  . Transportation needs - medical: Not on file  . Transportation needs - non-medical: Not on file  Occupational History  . Not on file  Tobacco Use  . Smoking status: Former Smoker    Years: 15.00    Types: Cigarettes    Last attempt to quit: 1964    Years since quitting: 54.9  . Smokeless tobacco: Never Used  Substance and Sexual Activity  . Alcohol use: No  . Drug use: No  . Sexual activity: Yes  Other Topics Concern  . Not on file  Social History Narrative  . Not on file    FAMILY HISTORY:  Family History  Problem Relation Age of Onset  . Colon cancer Neg Hx     CURRENT MEDICATIONS:  Outpatient Encounter Medications as of 02/11/2017  Medication Sig Note  . aspirin EC 81 MG tablet Take 81 mg by mouth daily.   Mar[place] Kitchen atorvastatin (LIPITOR) 10 MG tablet Take 10 mg by mouth at bedtime.    . Cholecalciferol (VITAMIN D3) 5000 units CAPS Take 5,000 Units by mouth daily.    . Diphenhyd-Hydrocort-Nystatin (FIRST-DUKES MOUTHWASH) SUSP Use as directed 5 mLs in the mouth or throat 4 (four) times daily as needed.   . diphenoxylate-atropine (LOMOTIL) 2.5-0.025 MG tablet Take 1 tablet by mouth 4 (four) times daily as needed for diarrhea or loose stools.   . finasteride (PROSCAR) 5  MG tablet Take 5 mg by mouth at bedtime.    . lidocaine-prilocaine (EMLA) cream Apply to affected area once 08/05/2016: New Med   . lisinopril (PRINIVIL,ZESTRIL) 5 MG tablet Take 5 mg by mouth at bedtime.    . metFORMIN (GLUCOPHAGE) 500 MG tablet Take 500 mg by mouth 2 (two) times daily.   . metoprolol (LOPRESSOR) 50 MG tablet Take 50 mg by mouth 2 (two) times daily.   . ondansetron (ZOFRAN) 8 MG tablet Take 1 tablet (8 mg total) by mouth 2 (two) times daily as needed for refractory nausea / vomiting. Start on day 3 after chemotherapy. 08/05/2016: New Med  . prochlorperazine (COMPAZINE) 10 MG tablet Take 1 tablet (10 mg total) by mouth every 6 (six) hours as needed (Nausea or vomiting). 08/05/2016: New Med  . vitamin B-12 (CYANOCOBALAMIN) 1000 MCG tablet Take 1,000 mcg by mouth daily.    . [DISCONTINUED] fluorouracil CALGB 60737 in sodium chloride 0.9 % 150 mL Inject into the vein over 96 hr. Every 2 weeks, infusion pump for 46 hours   . [DISCONTINUED] leucovorin in dextrose 5 % 250 mL Inject into the vein once. Every  2 weeks   . [DISCONTINUED] OXALIPLATIN IV Inject into the vein. Every 2 weeks    No facility-administered encounter medications on file as of 02/11/2017.     ALLERGIES:  Allergies  Allergen Reactions  . Amoxicillin Hives    Has patient had a PCN reaction causing immediate rash, facial/tongue/throat swelling, SOB or lightheadedness with hypotension: No Has patient had a PCN reaction causing severe rash involving mucus membranes or skin necrosis: Yes Has patient had a PCN reaction that required hospitalization No Has patient had a PCN reaction occurring within the last 10 years: No If all of the above answers are "NO", then may proceed with Cephalosporin use.   . Tamsulosin Hcl     Cant sleep, confusion      PHYSICAL EXAM:  ECOG Performance status: 1-2 - Symptomatic; requires occasional assistance.   Vitals:   02/11/17 1106  BP: (!) 155/70  Pulse: 70  Resp: 18  Temp:  97.7 F (36.5 C)  SpO2: 96%      Physical Exam  Constitutional: He is oriented to person, place, and time and well-developed, well-nourished, and in no distress.  HENT:  Head: Normocephalic.  Mouth/Throat: Oropharynx is clear and moist.  Eyes: Conjunctivae are normal. No scleral icterus.  Neck: Normal range of motion. Neck supple.  Cardiovascular: Normal rate and regular rhythm.  Pulmonary/Chest: Effort normal. No respiratory distress. He has no wheezes.  Abdominal: Soft. Bowel sounds are normal. There is no tenderness.  Musculoskeletal: Normal range of motion. He exhibits no edema.  Lymphadenopathy:    He has no cervical adenopathy.       Right: No supraclavicular adenopathy present.       Left: No supraclavicular adenopathy present.  Neurological: He is alert and oriented to person, place, and time. No cranial nerve deficit.  Skin: Skin is warm and dry. No rash noted.  Psychiatric: Mood, memory, affect and judgment normal.  Nursing note and vitals reviewed.    LABORATORY DATA:  I have reviewed the labs as listed.  CBC    Component Value Date/Time   WBC 8.5 02/11/2017 1114   RBC 3.56 (L) 02/11/2017 1114   HGB 12.2 (L) 02/11/2017 1114   HCT 38.7 (L) 02/11/2017 1114   PLT 143 (L) 02/11/2017 1114   MCV 108.7 (H) 02/11/2017 1114   MCH 34.3 (H) 02/11/2017 1114   MCHC 31.5 02/11/2017 1114   RDW 14.3 02/11/2017 1114   LYMPHSABS 3.7 02/11/2017 1114   MONOABS 1.3 (H) 02/11/2017 1114   EOSABS 0.1 02/11/2017 1114   BASOSABS 0.0 02/11/2017 1114   CMP Latest Ref Rng & Units 02/11/2017 01/27/2017 01/13/2017  Glucose 65 - 99 mg/dL 111(H) 179(H) 210(H)  BUN 6 - 20 mg/dL 12 9 9   Creatinine 0.61 - 1.24 mg/dL 0.73 0.69 0.82  Sodium 135 - 145 mmol/L 138 138 136  Potassium 3.5 - 5.1 mmol/L 4.0 3.9 4.3  Chloride 101 - 111 mmol/L 105 103 99(L)  CO2 22 - 32 mmol/L 26 28 27   Calcium 8.9 - 10.3 mg/dL 9.3 9.0 9.2  Total Protein 6.5 - 8.1 g/dL 7.2 6.7 6.6  Total Bilirubin 0.3 - 1.2  mg/dL 0.8 0.7 0.6  Alkaline Phos 38 - 126 U/L 49 50 49  AST 15 - 41 U/L 37 32 32  ALT 17 - 63 U/L 27 24 23     PENDING LABS:    DIAGNOSTIC IMAGING:  CT CHEST, ABDOMEN, AND PELVIS WITH CONTRAST  TECHNIQUE: Multidetector CT imaging of the chest, abdomen and pelvis  was performed following the standard protocol during bolus administration of intravenous contrast.  CONTRAST:  184mL ISOVUE-300 IOPAMIDOL (ISOVUE-300) INJECTION 61%  COMPARISON:  Abdomen and pelvis CT 07/14/2016.  Chest CT 08/04/2016.  FINDINGS: CT CHEST FINDINGS  Cardiovascular: The heart size is normal. No pericardial effusion. Coronary artery calcification is evident. Atherosclerotic calcification is noted in the wall of the thoracic aorta. Ascending thoracic aorta measures 4.6 cm diameter. Left Port-A-Cath tip is positioned in the mid SVC.  Mediastinum/Nodes: 13 mm short axis subcarinal lymph node was only 9 mm on the prior study. Small hilar lymph nodes are stable bilaterally. The esophagus has normal imaging features. There is no axillary lymphadenopathy.  Lungs/Pleura: Biapical scarring is similar to prior. 10 x 10 mm nodule posterior right costophrenic sulcus is similar to prior when it measured 9 x 9 mm. Left upper lobe calcified granuloma is similar. No focal airspace consolidation. No pulmonary edema or pleural effusion.  Musculoskeletal: Bone windows reveal no worrisome lytic or sclerotic osseous lesions.  CT ABDOMEN PELVIS FINDINGS  Hepatobiliary: No focal abnormality within the liver parenchyma. Gallstones are evident. No intrahepatic or extrahepatic biliary dilation.  Pancreas: No focal mass lesion. No dilatation of the main duct. No intraparenchymal cyst. No peripancreatic edema.  Spleen: Surgically absent with probable small splenule in the splenectomy bed.  Adrenals/Urinary Tract: No adrenal nodule or mass. 7 mm hypoattenuating lesion upper pole left kidney similar to  prior. 9 mm exophytic lesion upper pole left kidney posteriorly has attenuation too high to be a simple cyst but is stable in the interval since prior study. 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 No evidence for hydroureter. The urinary bladder appears normal for the degree of distention.  Stomach/Bowel: Stomach is nondistended. No gastric wall thickening. No evidence of outlet obstruction. Duodenum is normally positioned as is the ligament of Treitz. No small bowel wall thickening. No small bowel dilatation. Right hemicolectomy. Diverticular changes are noted in the left colon without evidence of diverticulitis.  Vascular/Lymphatic: There is abdominal aortic atherosclerosis without aneurysm. There is no gastrohepatic or hepatoduodenal ligament lymphadenopathy. No intraperitoneal or retroperitoneal lymphadenopathy. No pelvic sidewall lymphadenopathy. 1.9 x 1.5 cm soft tissue nodule is identified adjacent to the distal sigmoid colon (image 110 series 2). 11 mm left omental nodule identified on image 77 series 2.  Reproductive: Prostate gland is enlarged.  Other: No intraperitoneal free fluid.  Musculoskeletal: Bone windows reveal no worrisome lytic or sclerotic osseous lesions.  IMPRESSION: 1. Interval increase in size of a subcarinal lymph node now mildly enlarged at 13 mm short axis. Metastatic disease cannot be excluded. 2. Interval development of an almost 2 cm soft tissue nodule adjacent to the distal transverse colon with a new 11 mm left omental nodule. Imaging features highly concerning for metastatic involvement. PET-CT may prove helpful to further evaluate as clinically warranted. 3. 9 mm exophytic lesion upper pole left kidney has attenuation too high to be a simple cyst. As neoplasm could have this appearance, continued attention on follow-up imaging recommended. 4. Cholelithiasis. 5.  Aortic Atherosclerois  (ICD10-170.0)   Electronically Signed   By: Misty Stanley M.D.   On: 02/07/2017 14:16    PATHOLOGY:  Colon resection surgical path: 07/11/16          ASSESSMENT & PLAN:   Stage IIIB adenocarcinoma of colon:  -Patient completed cycle 12 of adjuvant FOLFOX on 01/27/2017.   -Unfortunately suspect patient has progressive disease.  CEA has gone up to 6.2 on 12/30/2016. Most  recent restaging scans on 02/06/17 demonstrated interval increase in size of a subcarinal lymph node now mildly enlarged at 13 mm short axis. Metastatic disease cannot be excluded. Interval development of an almost 2 cm soft tissue nodule adjacent to the distal transverse colon with a new 11 mm left omental nodule. Imaging features highly concerning for metastatic involvement.  -I have ordered a stat PET-CT to see if these new lesions are hypermetabolically active.  -I have discussed with him that if his PET-CT results are positive for progressive disease, I plan to put him on opdivo q14 days. I have reviewed side effects of opdivo in detial with the patient and he is willing to proceed if PET-CT results are positive.  -Will plan to see him for follow up with cycle 2 of Opdivo.   Orders Placed This Encounter  Procedures  . NM PET Image Restag (PS) Skull Base To Thigh    Standing Status:   Future    Standing Expiration Date:   02/11/2018    Order Specific Question:   If indicated for the ordered procedure, I authorize the administration of a radiopharmaceutical per Radiology protocol    Answer:   Yes    Order Specific Question:   Preferred imaging location?    Answer:   [facility]    Order Specific Question:   Radiology Contrast Protocol - do NOT remove file path    Answer:   [email or web address]    Order Specific Question:   Reason for Exam additional comments    Answer:   patient just completed adjuvant FOLFOX, recent restaging CT scans showed new omental nodule and soft  tissue nodule near transverse colon, and enlarging subcarinal LN, assess for hypermetabolic activity.     All questions were answered to patient's stated satisfaction. Encouraged patient to call with any new concerns or questions before his next visit to the cancer center and we can certain see him sooner, if needed.      Twana First, MD

## 2017-02-17 ENCOUNTER — Encounter (HOSPITAL_COMMUNITY)
Admission: RE | Admit: 2017-02-17 | Discharge: 2017-02-17 | Disposition: A | Discharge status: Home / Self Care | Payer: Medicare Other | Source: Ambulatory Visit | Attending: Oncology | Admitting: Oncology

## 2017-02-17 DIAGNOSIS — C184 Malignant neoplasm of transverse colon: Secondary | ICD-10-CM | POA: Diagnosis present

## 2017-02-17 LAB — GLUCOSE, CAPILLARY: Glucose-Capillary: 122 mg/dL — ABNORMAL HIGH (ref 65–99)

## 2017-02-17 MED ORDER — FLUDEOXYGLUCOSE F - 18 (FDG) INJECTION
10.0500 | Freq: Once | INTRAVENOUS | Status: AC | PRN
  Administered 2017-02-17: 10.05 via INTRAVENOUS

## 2017-02-18 ENCOUNTER — Encounter (HOSPITAL_COMMUNITY): Payer: Self-pay | Admitting: Emergency Medicine

## 2017-02-18 ENCOUNTER — Other Ambulatory Visit (HOSPITAL_COMMUNITY): Payer: Self-pay | Admitting: Oncology

## 2017-02-18 NOTE — Patient Instructions (Signed)
[place]    CHEMOTHERAPY INSTRUCTIONS  We are going to treat you with an immunotherapy called opdivo for your colon cancer.  Opdivo is given every 2 weeks.  This infusion takes 30 minutes to infuse.  There are no premedications given prior to administering this medication.  Opdivo is given with palliative intent, which mean that you are treatable but not curable.  You will stay on opdivo until you have progression or you can no longer tolerate the drug.   You will see the doctor regularly throughout treatment.  We monitor your lab work prior to every treatment.  The doctor monitors your response to treatment by the way you are feeling, your blood work, and scans periodically.  There will be wait times while you are here for treatment.  It will take about 30 mintues to 1 hour for labs to result.   There will be wait times while pharmacy mixes your medication.   POTENTIAL SIDE EFFECTS OF TREATMENT:  Nivolumab (Opdivo)  About This Drug Nivolumab is used to treat cancer. It is given in the vein (IV).  Possible Side Effects . Bone marrow depression. This is a decrease in the number of white blood cells, red blood cells, and platelets. This may raise your risk of infection, make you tired and weak (fatigue), and raise your risk of bleeding. . Tiredness and weakness . Joint, muscle and bone pain . Back pain . Loose bowel movements (diarrhea) . Nausea . Decreased appetite (decreased hunger) . Constipation (not able to move bowels) . Cough and trouble breathing . Upper respiratory infection . Fever . Rash and itching . Electrolyte changes Note: Each of the side effects above was reported in 20% or greater of patients treated with nivolumab. Not all possible side effects are included above. Your side effects may be different or more severe if you receive nivolumab in combination with other chemotherapy agents.  Warnings and Precautions . This drug works  with your immune system and can cause inflammation in any of your organs and tissues and can change how they work. This may put you at risk for developing serious medical problems which can very rarely be fatal. . Colitis (swelling (inflammation) in the colon) - symptoms are loose bowel movements (diarrhea) stomach cramping, and sometimes blood in the bowel movements . Changes in liver function . Changes in kidney function . Inflammation (swelling) of the lungs which can very rarely be fatal - you may have a dry cough or trouble breathing. . This drug may affect some of your hormone glands (especially the thyroid, adrenals, pituitary and pancreas). . Blood sugar levels may change and you may develop diabetes. If you already have diabetes, changes may need to be made to your diabetes medication. . Severe allergic skin reaction which can very rarely be fatal. You may develop blisters on your skin that are filled with fluid or a severe red rash all over your body that may be painful. . Changes in your central nervous system can happen. The central nervous system is made up of your brain and spinal cord. You could feel extreme tiredness, agitation, confusion, hallucinations (see or hear things that are not there), trouble understanding or speaking, loss of control of your bowels or bladder, eyesight changes, numbness or lack of strength to your arms, legs, face, or body, and coma. If you start to have any of these symptoms let your doctor know right away. . While you are getting this  drug in your vein (IV), you may have a reaction to the drug. Sometimes you may be given medication to stop or lessen these side effects. Your nurse will check you closely for these signs: fever or shaking chills, flushing, facial swelling, feeling dizzy, headache, trouble breathing, rash, itching, chest tightness, or chest pain. These reactions may happen after your infusion. If this happens, call 911 for emergency care. .  Increased risk of complications, which may very rarely be fatal, in patients who will undergo a stem cell transplant after receiving nivolumab.  Important Information . This drug may be present in the saliva, tears, sweat, urine, stool, vomit, semen, and vaginal secretions. Talk to your doctor and/or your nurse about the necessary precautions to take during this time.  Treating Side Effects . To decrease infection, wash your hands regularly . Avoid close contact with people who have a cold, the flu, or other infections. . Take your temperature as your doctor or nurse tells you, and whenever you feel like you may have a fever . To help decrease bleeding, use a soft toothbrush. Check with your nurse before using dental floss. . Be very careful when using knives or tools . Use an electric shaver instead of a razor . Ask your doctor or nurse about medicines that are available to help stop or lessen constipation, diarrhea and/or nausea. . Drink plenty of fluids (a minimum of eight glasses per day is recommended). . If you are not able to move your bowels, check with your doctor or nurse before you use any enemas, laxatives, or suppositories . To help with nausea and vomiting, eat small, frequent meals instead of three large meals a day. Choose foods and drinks that are at room temperature. Ask your nurse or doctor about other helpful tips and medicine that is available to help or stop lessen these symptoms. . If you get diarrhea, eat low-fiber foods that are high in protein and calories and avoid foods that can irritate your digestive tracts or lead to cramping. Ask your nurse or doctor about medicine that can lessen or stop your diarrhea. . To help with decreased appetite, eat small, frequent meals . Eat high caloric food such as pudding, ice cream, yogurt and milkshakes. . Manage tiredness by pacing your activities for the day. Be sure to include periods of rest between energy-draining activities .  Keeping your pain under control is important to your wellbeing. Please tell your doctor or nurse if you are experiencing pain. . If you have diabetes, keep good control of your blood sugar level. Tell your nurse or your doctor if your glucose levels are higher or lower than normal . If you get a rash do not put anything on it unless your doctor or nurse says you may. Keep the area around the rash clean and dry. Ask your doctor for medicine if your rash bothers you. . Infusion reactions may occur after your infusion. If this happens, call 911 for emergency care.  Food and Drug Interactions . There are no known interactions of nivolumab with food or other medications. . Tell your doctor and pharmacist about all the medicines and dietary supplements (vitamins, minerals, herbs and others) that you are taking at this time. The safety and use of dietary supplements and alternative diets are often not known. Using these might affect your cancer or interfere with your treatment. Until more is known, you should not use dietary supplements or alternative diets without your cancer doctor's help.  When to  Call the Doctor Call your doctor or nurse if you have any of these symptoms and/or any new or unusual symptoms: . Fever of 100.5 F (38 C) or higher . Chills . Easy bleeding or bruising . Wheezing or trouble breathing . Dry cough, or cough with yellow, green or bloody mucus . Confusion and/or agitation . Hallucinations . Trouble understanding or speaking . Blurry vision or changes in your eyesight . Numbness or lack of strength to your arms, legs, face, or body . Feeling dizzy or lightheaded . Loose bowel movements (diarrhea) more than 4 times a day or diarrhea with weakness or lightheadedness . Nausea that stops you from eating or drinking, and/or that is not relieved by prescribed medicines . Lasting loss of appetite or rapid weight loss of five pounds in a week . No bowel movement for 3 days or you  feel uncomfortable . Bad abdominal pain, especially in upper right area . Fatigue or extreme weakness that interferes with normal activities . Decreased urine . Unusual thirst or passing urine often . Rash that is not relieved by prescribed medicines . Rash or itching . Flu-like symptoms: fever, headache, muscle and joint aches, and fatigue (low energy, feeling weak) . Signs of liver problems: dark urine, pale bowel movements, bad stomach pain, feeling very tired and weak, unusual itching, or yellowing of the eyes or skin. . Signs of infusion reaction: fever or shaking chills, flushing, facial swelling, feeling dizzy, headache, trouble breathing, rash, itching, chest tightness, or chest pain. . If you think you may be pregnant  Reproduction Warnings . Pregnancy warning: This drug can have harmful effects on the unborn baby. Women of child bearing potential should use effective methods of birth control during your cancer treatment and for at least 5 months after treatment. Let your doctor know right away if you think you may be pregnant. . Breastfeeding warning: It is not known if this drug passes into breast milk. For this reason, women should not breast feed during treatment because this drug could enter the breast milk and cause harm to a breast feeding baby. . Fertility warning: Human fertility studies have not been done with this drug. Talk with your doctor or nurse if you plan to have children. Ask for information on sperm or egg banking.    SELF CARE ACTIVITIES WHILE ON CHEMOTHERAPY:  Hydration Increase your fluid intake 48 hours prior to treatment and drink at least 8 to 12 cups (64 ounces) of water/decaff beverages per day after treatment. You can still have your cup of coffee or soda but these beverages do not count as part of your 8 to 12 cups that you need to drink daily. No alcohol intake.  Medications Continue taking your normal prescription medication as prescribed.  If you  start any new herbal or new supplements please let us know first to make sure it is safe.  Mouth Care Have teeth cleaned professionally before starting treatment. Keep dentures and partial plates clean. Use soft toothbrush and do not use mouthwashes that contain alcohol. Biotene is a good mouthwash that is available at most pharmacies or may be ordered by calling [phone]. Use warm salt water gargles (1 teaspoon salt per 1 quart warm water) before and after meals and at bedtime. Or you may rinse with 2 tablespoons of three-percent hydrogen peroxide mixed in eight ounces of water. If you are still having problems with your mouth or sores in your mouth please call the clinic. If you need dental  work, please let the doctor know before you go for your appointment so that we can coordinate the best possible time for you in regards to your chemo regimen. You need to also let your dentist know that you are actively taking chemo. We may need to do labs prior to your dental appointment.   Skin Care Always use sunscreen that has not expired and with SPF (Sun Protection Factor) of 50 or higher. Wear hats to protect your head from the sun. Remember to use sunscreen on your hands, ears, face, & feet.  Use good moisturizing lotions such as udder cream, eucerin, or even Vaseline. Some chemotherapies can cause dry skin, color changes in your skin and nails.    . Avoid long, hot showers or baths. . Use gentle, fragrance-free soaps and laundry detergent. . Use moisturizers, preferably creams or ointments rather than lotions because the thicker consistency is better at preventing skin dehydration. Apply the cream or ointment within 15 minutes of showering. Reapply moisturizer at night, and moisturize your hands every time after you wash them.  Hair Loss (if your doctor says your hair will fall out)  . If your doctor says that your hair is likely to fall out, decide before you begin chemo whether you want to wear  a wig. You may want to shop before treatment to match your hair color. . Hats, turbans, and scarves can also camouflage hair loss, although some people prefer to leave their heads uncovered. If you go bare-headed outdoors, be sure to use sunscreen on your scalp. . Cut your hair short. It eases the inconvenience of shedding lots of hair, but it also can reduce the emotional impact of watching your hair fall out. . Don't perm or color your hair during chemotherapy. Those chemical treatments are already damaging to hair and can enhance hair loss. Once your chemo treatments are done and your hair has grown back, it's OK to resume dyeing or perming hair. With chemotherapy, hair loss is almost always temporary. But when it grows back, it may be a different color or texture. In older adults who still had hair color before chemotherapy, the new growth may be completely gray.  Often, new hair is very fine and soft.  Infection Prevention Please wash your hands for at least 30 seconds using warm soapy water. Handwashing is the #1 way to prevent the spread of germs. Stay away from sick people or people who are getting over a cold. If you develop respiratory systems such as green/yellow mucus production or productive cough or persistent cough let us know and we will see if you need an antibiotic. It is a good idea to keep a pair of gloves on when going into grocery stores/[employer] to decrease your risk of coming into contact with germs on the carts, etc. Carry alcohol hand gel with you at all times and use it frequently if out in public. If your temperature reaches 100.5 or higher please call the clinic and let us know.  If it is after hours or on the weekend please go to the ER if your temperature is over 100.5.  Please have your own personal thermometer at home to use.    Sex and bodily fluids If you are going to have sex, a condom must be used to protect the person that isn't taking chemotherapy. Chemo can decrease  your libido (sex drive). For a few days after chemotherapy, chemotherapy can be excreted through your bodily fluids.  When using the toilet  please close the lid and flush the toilet twice.  Do this for a few day after you have had chemotherapy.     Effects of chemotherapy on your sex life Some changes are simple and won't last long. They won't affect your sex life permanently. Sometimes you may feel: . too tired . not strong enough to be very active . sick or sore  . not in the mood . anxious or low Your anxiety might not seem related to sex. For example, you may be worried about the cancer and how your treatment is going. Or you may be worried about money, or about how you family are coping with your illness. These things can cause stress, which can affect your interest in sex. It's important to talk to your partner about how you feel. Remember - the changes to your sex life don't usually last long. There's usually no medical reason to stop having sex during chemo. The drugs won't have any long term physical effects on your performance or enjoyment of sex. Cancer can't be passed on to your partner during sex  Contraception It's important to use reliable contraception during treatment. Avoid getting pregnant while you or your partner are having chemotherapy. This is because the drugs may harm the baby. Sometimes chemotherapy drugs can leave a man or woman infertile.  This means you would not be able to have children in the future. You might want to talk to someone about permanent infertility. It can be very difficult to learn that you may no longer be able to have children. Some people find counselling helpful. There might be ways to preserve your fertility, although this is easier for men than for women. You may want to speak to a fertility expert. You can talk about sperm banking or harvesting your eggs. You can also ask about other fertility options, such as donor eggs. If you have or have had  breast cancer, your doctor might advise you not to take the contraceptive pill. This is because the hormones in it might affect the cancer.  It is not known for sure whether or not chemotherapy drugs can be passed on through semen or secretions from the vagina. Because of this some doctors advise people to use a barrier method if you have sex during treatment. This applies to vaginal, anal or oral sex. Generally, doctors advise a barrier method only for the time you are actually having the treatment and for about a week after your treatment. Advice like this can be worrying, but this does not mean that you have to avoid being intimate with your partner. You can still have close contact with your partner and continue to enjoy sex.  Animals If you have cats or birds we just ask that you not change the litter or change the cage.  Please have someone else do this for you while you are on chemotherapy.   Food Safety During and After Cancer Treatment Food safety is important for people both during and after cancer treatment. Cancer and cancer treatments, such as chemotherapy, radiation therapy, and stem cell/bone marrow transplantation, often weaken the immune system. This makes it harder for your body to protect itself from foodborne illness, also called food poisoning. Foodborne illness is caused by eating food that contains harmful bacteria, parasites, or viruses.  Foods to avoid Some foods have a higher risk of becoming tainted with bacteria. These include: Marland Kitchen Unwashed fresh fruit and vegetables, especially leafy vegetables that can hide dirt and other contaminants .  Raw sprouts, such as alfalfa sprouts . Raw or undercooked beef, especially ground beef, or other raw or undercooked meat and poultry . Fatty, fried, or spicy foods immediately before or after treatment.  These can sit heavy on your stomach and make you feel nauseous. . Raw or undercooked shellfish, such as oysters. . Sushi and sashimi,  which often contain raw fish.  . Unpasteurized beverages, such as unpasteurized fruit juices, raw milk, raw yogurt, or cider . Undercooked eggs, such as soft boiled, over easy, and poached; raw, unpasteurized eggs; or foods made with raw egg, such as homemade raw cookie dough and homemade mayonnaise Simple steps for food safety Shop smart. . Do not buy food stored or displayed in an unclean area. . Do not buy bruised or damaged fruits or vegetables. . Do not buy cans that have cracks, dents, or bulges. . Pick up foods that can spoil at the end of your shopping trip and store them in a cooler on the way home. Prepare and clean up foods carefully. . Rinse all fresh fruits and vegetables under running water, and dry them with a clean towel or paper towel. . Clean the top of cans before opening them. . After preparing food, wash your hands for 20 seconds with hot water and soap. Pay special attention to areas between fingers and under nails. . Clean your utensils and dishes with hot water and soap. Marland Kitchen Disinfect your kitchen and cutting boards using 1 teaspoon of liquid, unscented bleach mixed into 1 quart of water.   Dispose of old food. . Eat canned and packaged food before its expiration date (the "use by" or "best before" date). . Consume refrigerated leftovers within 3 to 4 days. After that time, throw out the food. Even if the food does not smell or look spoiled, it still may be unsafe. Some bacteria, such as Listeria, can grow even on foods stored in the refrigerator if they are kept for too long. Take precautions when eating out. . At restaurants, avoid buffets and salad bars where food sits out for a long time and comes in contact with many people. Food can become contaminated when someone with a virus, often a norovirus, or another "bug" handles it. . Put any leftover food in a "to-go" container yourself, rather than having the server do it. And, refrigerate leftovers as soon as you get  home. . Choose restaurants that are clean and that are willing to prepare your food as you order it cooked.    MEDICATIONS:   Over-the-Counter Meds:  Miralax 1 capful in 8 oz of fluid daily. May increase to two times a day if needed. This is a stool softener. If this doesn't work proceed you can add:  Senokot S-start with 1 tablet two times a day and increase to 4 tablets two times a day if needed. (total of 8 tablets in a 24 hour period). This is a stimulant laxative.   Call us if this does not help your bowels move.   Imodium 2mg  capsule. Take 2 capsules after the 1st loose stool and then 1 capsule every 2 hours until you go a total of 12 hours without having a loose stool. Call the [facility] if loose stools continue. If diarrhea occurs @ bedtime, take 2 capsules @ bedtime. Then take 2 capsules every 4 hours until morning. Call [facility].      Diarrhea Sheet  If you are having loose stools/diarrhea, please purchase Imodium and begin taking as  outlined:  At the first sign of poorly formed or loose stools you should begin taking Imodium(loperamide) 2 mg capsules.  Take two caplets (4mg ) followed by one caplet (2mg ) every 2 hours until you have had no diarrhea for 12 hours.  During the night take two caplets (4mg ) at bedtime and continue every 4 hours during the night until the morning.  Stop taking Imodium only after there is no sign of diarrhea for 12 hours.    Always call the [facility] if you are having loose stools/diarrhea that you can't get under control.  Loose stools/disrrhea leads to dehydration (loss of water) in your body.  We have other options of trying to get the loose stools/diarrhea to stopped but you must let us know!      Constipation Sheet *Miralax in 8 oz of fluid daily.  May increase to two times a day if needed.  This is a stool softener.  If this not enough to keep your bowel regular:  You can add:  *Senokot S, start with one tablet twice a day  and can increase to 4 tablets twice a day if needed.  This is a stimulant laxative.   Sometimes when you take pain medication you need BOTH a medicine to keep your stool soft and a medicine to help your bowel push it out!  Please call if the above does not work for you.   Do not go more than 2 days without a bowel movement.  It is very important that you do not become constipated.  It will make you feel sick to your stomach (nausea) and can cause abdominal pain and vomiting.      SYMPTOMS TO REPORT AS SOON AS POSSIBLE AFTER TREATMENT:  FEVER GREATER THAN 100.5 F  CHILLS WITH OR WITHOUT FEVER  NAUSEA AND VOMITING THAT IS NOT CONTROLLED WITH YOUR NAUSEA MEDICATION  UNUSUAL SHORTNESS OF BREATH  UNUSUAL BRUISING OR BLEEDING  TENDERNESS IN MOUTH AND THROAT WITH OR WITHOUT PRESENCE OF ULCERS  URINARY PROBLEMS  BOWEL PROBLEMS  UNUSUAL RASH    Wear comfortable clothing and clothing appropriate for easy access to any Portacath or PICC line. Let us know if there is anything that we can do to make your therapy better!      What to do if you need assistance after hours or on the weekends: CALL [phone].  HOLD on the line, do not hang up.  You will hear multiple messages but at the end you will be connected with a nurse triage line.  They will contact the doctor if necessary.  Most of the time they will be able to assist you.  Do not call the hospital operator.       I have been informed and understand all of the instructions given to me and have received a copy. I have been instructed to call the clinic [phone]  or my family physician as soon as possible for continued medical care, if indicated. I do not have any more questions at this time but understand that I may call the [facility] at [phone] during office hours should I have questions or need assistance in obtaining follow-up care.

## 2017-02-18 NOTE — Progress Notes (Signed)
Chemotherapy teaching pulled together.  appts made. 

## 2017-02-23 ENCOUNTER — Encounter (HOSPITAL_COMMUNITY): Payer: Medicare Other | Attending: Oncology

## 2017-02-23 ENCOUNTER — Other Ambulatory Visit: Payer: Self-pay

## 2017-02-23 ENCOUNTER — Encounter (HOSPITAL_COMMUNITY): Payer: Self-pay

## 2017-02-23 ENCOUNTER — Encounter (HOSPITAL_COMMUNITY): Payer: Medicare Other

## 2017-02-23 VITALS — BP 144/88 | HR 98 | Temp 98.6°F | Resp 16 | Wt 198.8 lb

## 2017-02-23 DIAGNOSIS — C184 Malignant neoplasm of transverse colon: Secondary | ICD-10-CM

## 2017-02-23 DIAGNOSIS — D509 Iron deficiency anemia, unspecified: Secondary | ICD-10-CM | POA: Insufficient documentation

## 2017-02-23 DIAGNOSIS — Z5112 Encounter for antineoplastic immunotherapy: Secondary | ICD-10-CM | POA: Diagnosis present

## 2017-02-23 LAB — COMPREHENSIVE METABOLIC PANEL
ALT: 29 U/L (ref 17–63)
AST: 36 U/L (ref 15–41)
Albumin: 3.6 g/dL (ref 3.5–5.0)
Alkaline Phosphatase: 53 U/L (ref 38–126)
Anion gap: 8 (ref 5–15)
BUN: 11 mg/dL (ref 6–20)
CO2: 28 mmol/L (ref 22–32)
Calcium: 9.3 mg/dL (ref 8.9–10.3)
Chloride: 98 mmol/L — ABNORMAL LOW (ref 101–111)
Creatinine, Ser: 0.77 mg/dL (ref 0.61–1.24)
GFR calc Af Amer: >60 mL/min (ref >60)
GFR calc non Af Amer: >60 mL/min (ref >60)
Glucose, Bld: 255 mg/dL — ABNORMAL HIGH (ref 65–99)
Potassium: 4.1 mmol/L (ref 3.5–5.1)
Sodium: 134 mmol/L — ABNORMAL LOW (ref 135–145)
Total Bilirubin: 0.7 mg/dL (ref 0.3–1.2)
Total Protein: 7.3 g/dL (ref 6.5–8.1)

## 2017-02-23 LAB — CBC WITH DIFFERENTIAL/PLATELET
Basophils Absolute: 0 10*3/uL (ref 0.0–0.1)
Basophils Relative: 0 %
Eosinophils Absolute: 0.1 10*3/uL (ref 0.0–0.7)
Eosinophils Relative: 1 %
HCT: 41 % (ref 39.0–52.0)
Hemoglobin: 13 g/dL (ref 13.0–17.0)
Lymphocytes Relative: 24 %
Lymphs Abs: 2.2 10*3/uL (ref 0.7–4.0)
MCH: 33.8 pg (ref 26.0–34.0)
MCHC: 31.7 g/dL (ref 30.0–36.0)
MCV: 106.5 fL — ABNORMAL HIGH (ref 78.0–100.0)
Monocytes Absolute: 1.2 10*3/uL — ABNORMAL HIGH (ref 0.1–1.0)
Monocytes Relative: 13 %
Neutro Abs: 5.5 10*3/uL (ref 1.7–7.7)
Neutrophils Relative %: 62 %
Platelets: 197 10*3/uL (ref 150–400)
RBC: 3.85 MIL/uL — ABNORMAL LOW (ref 4.22–5.81)
RDW: 13.1 % (ref 11.5–15.5)
WBC: 9.1 10*3/uL (ref 4.0–10.5)

## 2017-02-23 LAB — TSH: TSH: 1.46 u[IU]/mL (ref 0.350–4.500)

## 2017-02-23 MED ORDER — SODIUM CHLORIDE 0.9 % IV SOLN
240.0000 mg | Freq: Once | INTRAVENOUS | Status: AC
  Administered 2017-02-23: 240 mg via INTRAVENOUS
  Filled 2017-02-23: qty 24

## 2017-02-23 MED ORDER — HEPARIN SOD (PORK) LOCK FLUSH 100 UNIT/ML IV SOLN
500.0000 [IU] | Freq: Once | INTRAVENOUS | Status: AC | PRN
  Administered 2017-02-23: 500 [IU]

## 2017-02-23 MED ORDER — SODIUM CHLORIDE 0.9 % IV SOLN
Freq: Once | INTRAVENOUS | Status: AC
  Administered 2017-02-23: 13:00:00 via INTRAVENOUS

## 2017-02-23 MED ORDER — SODIUM CHLORIDE 0.9% FLUSH
10.0000 mL | INTRAVENOUS | Status: DC | PRN
  Administered 2017-02-23: 10 mL
  Filled 2017-02-23: qty 10

## 2017-02-23 MED ORDER — GABAPENTIN 300 MG PO CAPS: 300.0000 mg | ORAL_CAPSULE | Freq: Three times a day (TID) | ORAL | 2 refills | Status: DC

## 2017-02-23 NOTE — Progress Notes (Signed)
Chemotherapy teaching completed.  Consent signed.  Extensive teaching packet given.     Spoke with Maudie Mercury at pathology and ordered KRAS for [id number].

## 2017-02-23 NOTE — Patient Instructions (Signed)
[facility] Discharge Instructions for Patients Receiving Chemotherapy   Beginning January 23rd 2017 lab work for the Cancer Center will be done in the  Main lab at [place] on 1st floor. If you have a lab appointment with the Cancer Center please come in thru the  Main Entrance and check in at the main information desk   Today you received the following chemotherapy agents   To help prevent nausea and vomiting after your treatment, we encourage you to take your nausea medication     If you develop nausea and vomiting, or diarrhea that is not controlled by your medication, call the clinic.  The clinic phone number is [phone]. Office hours are Monday-Friday 8:30am-5:00pm.  BELOW ARE SYMPTOMS THAT SHOULD BE REPORTED IMMEDIATELY:  *FEVER GREATER THAN 101.0 F  *CHILLS WITH OR WITHOUT FEVER  NAUSEA AND VOMITING THAT IS NOT CONTROLLED WITH YOUR NAUSEA MEDICATION  *UNUSUAL SHORTNESS OF BREATH  *UNUSUAL BRUISING OR BLEEDING  TENDERNESS IN MOUTH AND THROAT WITH OR WITHOUT PRESENCE OF ULCERS  *URINARY PROBLEMS  *BOWEL PROBLEMS  UNUSUAL RASH Items with * indicate a potential emergency and should be followed up as soon as possible. If you have an emergency after office hours please contact your primary care physician or go to the nearest emergency department.  Please call the clinic during office hours if you have any questions or concerns.   You may also contact the Patient Navigator at [phone] should you have any questions or need assistance in obtaining follow up care.      Resources For Cancer Patients and their Caregivers ? American Cancer Society: Can assist with transportation, wigs, general needs, runs Look Good Feel Better.        1-888-227-6333 ? Cancer Care: Provides financial assistance, online support groups, medication/co-pay assistance.  1-800-813-HOPE (4673) ? [facility] Assists [place] cancer  patients and their families through emotional , educational and financial support.  336-427-4357 ? [place] DSS Where to apply for food stamps, Medicaid and utility assistance. 336-342-1394 ? [facility]: Transportation to medical appointments. 336-347-2287 ? Social Security Administration: May apply for disability if have a Stage IV cancer. 336-342-7796 1-800-772-1213 ? [place] Aging, Disability and Transit Services: Assists with nutrition, care and transit needs. 336-349-2343         

## 2017-02-23 NOTE — Progress Notes (Signed)
1st opdivo given today.  Treatment given per orders. Patient tolerated it well without problems. Vitals stable and discharged home from clinic ambulatory. Follow up as scheduled.

## 2017-02-26 ENCOUNTER — Other Ambulatory Visit (HOSPITAL_COMMUNITY): Payer: Self-pay | Admitting: Pharmacist

## 2017-02-27 ENCOUNTER — Other Ambulatory Visit (HOSPITAL_COMMUNITY)
Admission: RE | Admit: 2017-02-27 | Discharge: 2017-02-27 | Disposition: A | Discharge status: Home / Self Care | Payer: Medicare Other | Source: Ambulatory Visit | Attending: Oncology | Admitting: Oncology

## 2017-02-27 ENCOUNTER — Other Ambulatory Visit (HOSPITAL_COMMUNITY): Payer: Self-pay | Admitting: Pharmacist

## 2017-02-27 DIAGNOSIS — C189 Malignant neoplasm of colon, unspecified: Secondary | ICD-10-CM | POA: Diagnosis present

## 2017-03-03 ENCOUNTER — Other Ambulatory Visit (HOSPITAL_COMMUNITY): Payer: Self-pay | Admitting: Pharmacist

## 2017-03-09 ENCOUNTER — Other Ambulatory Visit: Payer: Self-pay

## 2017-03-09 ENCOUNTER — Encounter (HOSPITAL_BASED_OUTPATIENT_CLINIC_OR_DEPARTMENT_OTHER): Payer: Medicare Other

## 2017-03-09 ENCOUNTER — Encounter (HOSPITAL_COMMUNITY): Payer: Medicare Other | Attending: Adult Health | Admitting: Oncology

## 2017-03-09 ENCOUNTER — Encounter (HOSPITAL_COMMUNITY): Payer: Self-pay

## 2017-03-09 VITALS — BP 121/63 | HR 66 | Temp 98.6°F | Resp 16 | Wt 205.0 lb

## 2017-03-09 DIAGNOSIS — C184 Malignant neoplasm of transverse colon: Secondary | ICD-10-CM | POA: Insufficient documentation

## 2017-03-09 DIAGNOSIS — R7989 Other specified abnormal findings of blood chemistry: Secondary | ICD-10-CM

## 2017-03-09 DIAGNOSIS — Z9221 Personal history of antineoplastic chemotherapy: Secondary | ICD-10-CM

## 2017-03-09 DIAGNOSIS — Z5112 Encounter for antineoplastic immunotherapy: Secondary | ICD-10-CM | POA: Diagnosis present

## 2017-03-09 LAB — CBC WITH DIFFERENTIAL/PLATELET
Basophils Absolute: 0 10*3/uL (ref 0.0–0.1)
Basophils Relative: 0 %
Eosinophils Absolute: 0.2 10*3/uL (ref 0.0–0.7)
Eosinophils Relative: 2 %
HCT: 37.7 % — ABNORMAL LOW (ref 39.0–52.0)
Hemoglobin: 11.9 g/dL — ABNORMAL LOW (ref 13.0–17.0)
Lymphocytes Relative: 37 %
Lymphs Abs: 2.8 10*3/uL (ref 0.7–4.0)
MCH: 33.1 pg (ref 26.0–34.0)
MCHC: 31.6 g/dL (ref 30.0–36.0)
MCV: 105 fL — ABNORMAL HIGH (ref 78.0–100.0)
Monocytes Absolute: 0.6 10*3/uL (ref 0.1–1.0)
Monocytes Relative: 8 %
Neutro Abs: 4 10*3/uL (ref 1.7–7.7)
Neutrophils Relative %: 53 %
Platelets: 201 10*3/uL (ref 150–400)
RBC: 3.59 MIL/uL — ABNORMAL LOW (ref 4.22–5.81)
RDW: 12.8 % (ref 11.5–15.5)
WBC: 7.6 10*3/uL (ref 4.0–10.5)

## 2017-03-09 LAB — COMPREHENSIVE METABOLIC PANEL
ALT: 34 U/L (ref 17–63)
AST: 62 U/L — ABNORMAL HIGH (ref 15–41)
Albumin: 3.4 g/dL — ABNORMAL LOW (ref 3.5–5.0)
Alkaline Phosphatase: 46 U/L (ref 38–126)
Anion gap: 6 (ref 5–15)
BUN: 11 mg/dL (ref 6–20)
CO2: 29 mmol/L (ref 22–32)
Calcium: 9.1 mg/dL (ref 8.9–10.3)
Chloride: 105 mmol/L (ref 101–111)
Creatinine, Ser: 0.97 mg/dL (ref 0.61–1.24)
GFR calc Af Amer: >60 mL/min (ref >60)
GFR calc non Af Amer: >60 mL/min (ref >60)
Glucose, Bld: 138 mg/dL — ABNORMAL HIGH (ref 65–99)
Potassium: 4.1 mmol/L (ref 3.5–5.1)
Sodium: 140 mmol/L (ref 135–145)
Total Bilirubin: 0.7 mg/dL (ref 0.3–1.2)
Total Protein: 6.9 g/dL (ref 6.5–8.1)

## 2017-03-09 MED ORDER — HEPARIN SOD (PORK) LOCK FLUSH 100 UNIT/ML IV SOLN
500.0000 [IU] | Freq: Once | INTRAVENOUS | Status: AC | PRN
  Administered 2017-03-09: 500 [IU]
  Filled 2017-03-09: qty 5

## 2017-03-09 MED ORDER — SODIUM CHLORIDE 0.9% FLUSH
10.0000 mL | INTRAVENOUS | Status: DC | PRN
  Administered 2017-03-09: 10 mL
  Filled 2017-03-09: qty 10

## 2017-03-09 MED ORDER — SODIUM CHLORIDE 0.9 % IV SOLN
Freq: Once | INTRAVENOUS | Status: AC
  Administered 2017-03-09: 14:00:00 via INTRAVENOUS

## 2017-03-09 MED ORDER — SODIUM CHLORIDE 0.9 % IV SOLN
240.0000 mg | Freq: Once | INTRAVENOUS | Status: AC
  Administered 2017-03-09: 240 mg via INTRAVENOUS
  Filled 2017-03-09: qty 24

## 2017-03-09 NOTE — Progress Notes (Signed)
[place] [address], [place] [address]   CLINIC:  Medical Oncology/Hematology  PCP:  Tobe Sos, MD [address] [place] [address] [phone]   REASON FOR VISIT:  Follow-up for Stage IIIB adenocarcinoma of colon   CURRENT THERAPY: FOLFOX, beginning 08/26/16    HISTORY OF PRESENT ILLNESS:  Kevin Kirk 83 y.o. male is here because of referral by Dr. Aviva Signs. The patient initially noticed bright red blood in his stool in December 2017.   The patient underwent colonoscopy on 06/05/16 with Dr. Laural Golden which showed a fungating, polypoid and ulcerated non-obstructing large mass was found in the mid transverse colon. The mass was partially circumferential (involving two-thirds of the lumen circumference). The mass measured three cm in length. No bleeding was present. Subsequent biopsy on 06/05/16 revealed adenocarcinoma.  06/20/16: CT abd/pelvis with contrast: Mild focal wall thickening along the proximal/mid transverse colon in the right mid abdomen, possibly corresponding to the abnormality on colonoscopy, equivocal. No findings suspicious for metastatic disease in the abdomen/pelvis.  07/11/16: Partial colectomy by Dr. Arnoldo Morale. This confirmed adenocarcinoma of the colon, grade 2, spanning 3.8 cm. The tumor invaded through the muscularis propria into the pericolonic soft tissues. Additional lymphovascular invasion was identified. Margins of resection were negative.  Of 16 lymph nodes sampled, one was found positive for metastatic disease. All other lymph nodes negative.  08/04/16: CTA chest: No demonstrable pulmonary embolus. Ascending thoracic aortic diameter is 4.6 x 4.4 cm. No dissection evident. Ascending thoracic aortic aneurysm. Granuloma right base, calcified. Bibasilar atelectasis. Mild calcification along the posterior right apical pleural region without pleural thickening or mass associated. No adenopathy. Scattered foci of aortic and  coronary artery calcification. Status post aortic valve replacement.  Baseline CEA: 2.6  08/26/16-01/27/17: 12 cycles of adjuvant FOLFOX  02/17/17: PET CT :Hypermetabolic left-sided omental nodule, consistent with metastatic disease. Equivocal low level hypermetabolism within mediastinal and hilar nodes. Distribution favors a benign/reactive etiology over anatypical distribution of colon cancer metastasis. Recommend attention on follow-up. Nodule adjacent the sigmoid colon is not significantly hypermetabolic, and is present in retrospect back to 06/20/2016.  02/23/17: Started cycle 1 of Opdivo   INTERVAL HISTORY:  Kevin Kirk 83 y.o. male returns to cancer center for follow-up for colon cancer.   Patient presents with his granddaughter.  He states overall he has been doing well.  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 He continues to take gabapentin TID, however admits to occasionally forgetting to take a dose. He has been tolerating opdivo well with no side effects.  He states his appetite is good denies any weight loss.  He denies any chest pain, shortness breath, abdominal pain, nausea, vomiting.  He denies any pain.  REVIEW OF SYSTEMS:  Review of Systems  Constitutional: Negative for chills, fatigue and fever.  HENT:  Negative.  Negative for mouth sores.   Eyes: Negative.   Respiratory: Negative.  Negative for cough and shortness of breath.   Cardiovascular: Negative.   Gastrointestinal: Negative.  Negative for diarrhea.  Endocrine: Negative.   Genitourinary: Negative.  Negative for dysuria and hematuria.   Musculoskeletal: Negative for back pain and neck pain.  Neurological: Positive for numbness.  Hematological: Negative.   Psychiatric/Behavioral: Negative.      PAST MEDICAL/SURGICAL HISTORY:  Past Medical History:  Diagnosis Date  . Chronic pulmonary embolism ([place]) 70/4888   compliction of the valve replacement  surgery  . COPD (chronic  obstructive pulmonary disease) ([place])   . Diabetes ([place]) 04/22/2016  . H/O aortic valve replacement 10/2008  . Hypercholesteremia   . Hypertension   . Pulmonary emboli ([place]) 10/2008  . Sleep apnea    Past Surgical History:  Procedure Laterality Date  . AORTIC VALVE REPLACEMENT     2010  . CARDIAC CATHETERIZATION  2010  . COLONOSCOPY N/A 06/05/2016   Procedure: COLONOSCOPY;  Surgeon: Rogene Houston, MD;  Location: [facility] ENDO SUITE;  Service: Endoscopy;  Laterality: N/A;  . PARTIAL COLECTOMY N/A 07/11/2016   Procedure: PARTIAL COLECTOMY;  Surgeon: Aviva Signs, MD;  Location: [facility] ORS;  Service: General;  Laterality: N/A;  . PORTACATH PLACEMENT N/A 08/13/2016   Procedure: INSERTION PORT-A-CATH LEFT SUBCLAVIAN;  Surgeon: Aviva Signs, MD;  Location: [facility] ORS;  Service: General;  Laterality: N/A;  . REPLACEMENT TOTAL KNEE     1996  . spenectomy     from trauma     SOCIAL HISTORY:  Social History   Socioeconomic History  . Marital status: Widowed    Spouse name: Not on file  . Number of children: Not on file  . Years of education: Not on file  . Highest education level: Not on file  Social Needs  . Financial resource strain: Not on file  . Food insecurity - worry: Not on file  . Food insecurity - inability: Not on file  . Transportation needs - medical: Not on file  . Transportation needs - non-medical: Not on file  Occupational History  . Not on file  Tobacco Use  . Smoking status: Former Smoker    Years: 15.00    Types: Cigarettes    Last attempt to quit: 1964    Years since quitting: 54.9  . Smokeless tobacco: Never Used  Substance and Sexual Activity  . Alcohol use: No  . Drug use: No  . Sexual activity: Yes  Other Topics Concern  . Not on file  Social History Narrative  . Not on file    FAMILY HISTORY:  Family History  Problem Relation Age of Onset  . Colon cancer Neg Hx     CURRENT MEDICATIONS:  Outpatient Encounter Medications as of  03/09/2017  Medication Sig  . aspirin EC 81 MG tablet Take 81 mg by mouth daily.  Marland Kitchen atorvastatin (LIPITOR) 10 MG tablet Take 10 mg by mouth at bedtime.   . Cholecalciferol (VITAMIN D3) 5000 units CAPS Take 5,000 Units by mouth daily.   . Diphenhyd-Hydrocort-Nystatin (FIRST-DUKES MOUTHWASH) SUSP Use as directed 5 mLs in the mouth or throat 4 (four) times daily as needed.  . diphenoxylate-atropine (LOMOTIL) 2.5-0.025 MG tablet Take 1 tablet by mouth 4 (four) times daily as needed for diarrhea or loose stools.  . finasteride (PROSCAR) 5 MG tablet Take 5 mg by mouth at bedtime.   . gabapentin (NEURONTIN) 300 MG capsule Take 1 capsule (300 mg total) by mouth 3 (three) times daily.  Marland Kitchen lisinopril (PRINIVIL,ZESTRIL) 10 MG tablet   . metFORMIN (GLUCOPHAGE) 1000 MG tablet   . metoprolol (LOPRESSOR) 50 MG tablet Take 50 mg by mouth 2 (two) times daily.  . Nivolumab (OPDIVO IV) Inject into the vein. Every 2 weeks  . vitamin B-12 (CYANOCOBALAMIN) 1000 MCG tablet Take 1,000 mcg by mouth daily.   Marland Kitchen lisinopril (PRINIVIL,ZESTRIL) 5 MG tablet Take 5 mg by mouth at bedtime.   . metFORMIN (GLUCOPHAGE) 500 MG tablet Take 500 mg by mouth 2 (two) times daily.   No facility-administered encounter medications on file  as of 03/09/2017.     ALLERGIES:  Allergies  Allergen Reactions  . Amoxicillin Hives    Has patient had a PCN reaction causing immediate rash, facial/tongue/throat swelling, SOB or lightheadedness with hypotension: No Has patient had a PCN reaction causing severe rash involving mucus membranes or skin necrosis: Yes Has patient had a PCN reaction that required hospitalization No Has patient had a PCN reaction occurring within the last 10 years: No If all of the above answers are "NO", then may proceed with Cephalosporin use.   . Tamsulosin Hcl     Cant sleep, confusion      PHYSICAL EXAM:  ECOG Performance status: 1-2 - Symptomatic; requires occasional assistance.   Blood pressure 131/65,  pulse 58, respiratory rate 16, temp 98.1, O2 sat 97% room air, weight 205 pounds, height 6 foot 1 inch   Physical Exam  Constitutional: He is oriented to person, place, and time and well-developed, well-nourished, and in no distress.  HENT:  Head: Normocephalic.  Mouth/Throat: Oropharynx is clear and moist.  Eyes: Conjunctivae are normal. No scleral icterus.  Neck: Normal range of motion. Neck supple.  Cardiovascular: Normal rate and regular rhythm.  Pulmonary/Chest: Effort normal. No respiratory distress. He has no wheezes.  Abdominal: Soft. Bowel sounds are normal. There is no tenderness.  Musculoskeletal: Normal range of motion. He exhibits no edema.  Lymphadenopathy:    He has no cervical adenopathy.       Right: No supraclavicular adenopathy present.       Left: No supraclavicular adenopathy present.  Neurological: He is alert and oriented to person, place, and time. No cranial nerve deficit.  Skin: Skin is warm and dry. No rash noted.  Psychiatric: Mood, memory, affect and judgment normal.  Nursing note and vitals reviewed.    LABORATORY DATA:  I have reviewed the labs as listed.  CBC    Component Value Date/Time   WBC 7.6 03/09/2017 1208   RBC 3.59 (L) 03/09/2017 1208   HGB 11.9 (L) 03/09/2017 1208   HCT 37.7 (L) 03/09/2017 1208   PLT 201 03/09/2017 1208   MCV 105.0 (H) 03/09/2017 1208   MCH 33.1 03/09/2017 1208   MCHC 31.6 03/09/2017 1208   RDW 12.8 03/09/2017 1208   LYMPHSABS 2.8 03/09/2017 1208   MONOABS 0.6 03/09/2017 1208   EOSABS 0.2 03/09/2017 1208   BASOSABS 0.0 03/09/2017 1208   CMP Latest Ref Rng & Units 03/09/2017 02/23/2017 02/11/2017  Glucose 65 - 99 mg/dL 138(H) 255(H) 111(H)  BUN 6 - 20 mg/dL _0 Creatinine 0.61 - 1.24 mg/dL 0.97 0.77 0.73  Sodium 135 - 145 mmol/L 140 134(L) 138  Potassium 3.5 - 5.1 mmol/L 4.1 4.1 4.0  Chloride 101 - 111 mmol/L 105 98(L) 105  CO2 22 - 32 mmol/L _1 Calcium 8.9 - 10.3 mg/dL 9.1 9.3 9.3  Total  Protein 6.5 - 8.1 g/dL 6.9 7.3 7.2  Total Bilirubin 0.3 - 1.2 mg/dL 0.7 0.7 0.8  Alkaline Phos 38 - 126 U/L 46 53 49  AST 15 - 41 U/L 62(H) 36 37  ALT 17 - 63 U/L 34 29 27    PENDING LABS:    DIAGNOSTIC IMAGING:  CT CHEST, ABDOMEN, AND PELVIS WITH CONTRAST  TECHNIQUE: Multidetector CT imaging of the chest, abdomen and pelvis was performed following the standard protocol during bolus administration of intravenous contrast.  CONTRAST:  155m ISOVUE-300 IOPAMIDOL (ISOVUE-300) INJECTION 61%  COMPARISON:  Abdomen and pelvis CT 07/14/2016.  Chest  CT 08/04/2016.  FINDINGS: CT CHEST FINDINGS  Cardiovascular: The heart size is normal. No pericardial effusion. Coronary artery calcification is evident. Atherosclerotic calcification is noted in the wall of the thoracic aorta. Ascending thoracic aorta measures 4.6 cm diameter. Left Port-A-Cath tip is positioned in the mid SVC.  Mediastinum/Nodes: 13 mm short axis subcarinal lymph node was only 9 mm on the prior study. Small hilar lymph nodes are stable bilaterally. The esophagus has normal imaging features. There is no axillary lymphadenopathy.  Lungs/Pleura: Biapical scarring is similar to prior. 10 x 10 mm nodule posterior right costophrenic sulcus is similar to prior when it measured 9 x 9 mm. Left upper lobe calcified granuloma is similar. No focal airspace consolidation. No pulmonary edema or pleural effusion.  Musculoskeletal: Bone windows reveal no worrisome lytic or sclerotic osseous lesions.  CT ABDOMEN PELVIS FINDINGS  Hepatobiliary: No focal abnormality within the liver parenchyma. Gallstones are evident. No intrahepatic or extrahepatic biliary dilation.  Pancreas: No focal mass lesion. No dilatation of the main duct. No intraparenchymal cyst. No peripancreatic edema.  Spleen: Surgically absent with probable small splenule in the splenectomy bed.  Adrenals/Urinary Tract: No adrenal nodule or mass. 7  mm hypoattenuating lesion upper pole left kidney similar to prior. 9 mm exophytic lesion upper pole left kidney posteriorly has attenuation too high to be a simple cyst but is stable in the interval since prior study. 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 No evidence for hydroureter. The urinary bladder appears normal for the degree of distention.  Stomach/Bowel: Stomach is nondistended. No gastric wall thickening. No evidence of outlet obstruction. Duodenum is normally positioned as is the ligament of Treitz. No small bowel wall thickening. No small bowel dilatation. Right hemicolectomy. Diverticular changes are noted in the left colon without evidence of diverticulitis.  Vascular/Lymphatic: There is abdominal aortic atherosclerosis without aneurysm. There is no gastrohepatic or hepatoduodenal ligament lymphadenopathy. No intraperitoneal or retroperitoneal lymphadenopathy. No pelvic sidewall lymphadenopathy. 1.9 x 1.5 cm soft tissue nodule is identified adjacent to the distal sigmoid colon (image 110 series 2). 11 mm left omental nodule identified on image 77 series 2.  Reproductive: Prostate gland is enlarged.  Other: No intraperitoneal free fluid.  Musculoskeletal: Bone windows reveal no worrisome lytic or sclerotic osseous lesions.  IMPRESSION: 1. Interval increase in size of a subcarinal lymph node now mildly enlarged at 13 mm short axis. Metastatic disease cannot be excluded. 2. Interval development of an almost 2 cm soft tissue nodule adjacent to the distal transverse colon with a new 11 mm left omental nodule. Imaging features highly concerning for metastatic involvement. PET-CT may prove helpful to further evaluate as clinically warranted. 3. 9 mm exophytic lesion upper pole left kidney has attenuation too high to be a simple cyst. As neoplasm could have this appearance, continued attention on follow-up imaging  recommended. 4. Cholelithiasis. 5.  Aortic Atherosclerois (ICD10-170.0)   Electronically Signed   By: Misty Stanley M.D.   On: 02/07/2017 14:16    PATHOLOGY:  Colon resection surgical path: 07/11/16          ASSESSMENT & PLAN:   Stage IIIB adenocarcinoma of colon completed cycle 12 of adjuvant FOLFOX on 01/27/2017. Then found to have progressive disease with new left sided 11 mm omental met on PET scan  On 02/17/17. Started on 2nd line opdivo on 02/23/17.   -Patient is tolerating Opdivo well.  Labs reviewed.  Labs are normal except for mildly elevated AST of 62, which we will  continue to monitor.  Proceed with cycle 3 of opdivo. -Will plan to restage after cycle 6 of opdivo to assess treatment response. -Check CEA level to make sure its coming down, with the next chemo treatment. -RTC in 4 weeks for follow up and every 2 weeks for opdivo.     All questions were answered to patient's stated satisfaction. Encouraged patient to call with any new concerns or questions before his next visit to the cancer center and we can certain see him sooner, if needed.      Twana First, MD

## 2017-03-09 NOTE — Progress Notes (Signed)
Labs reviewed with MD, proceed with treatment.  Treatment given per orders. Patient tolerated it well without problems. Vitals stable and discharged home from clinic ambulatory. Follow up as scheduled.  

## 2017-03-09 NOTE — Patient Instructions (Signed)
[facility] Discharge Instructions for Patients Receiving Chemotherapy   Beginning January 23rd 2017 lab work for the Cancer Center will be done in the  Main lab at [place] on 1st floor. If you have a lab appointment with the Cancer Center please come in thru the  Main Entrance and check in at the main information desk   Today you received the following chemotherapy agents   To help prevent nausea and vomiting after your treatment, we encourage you to take your nausea medication     If you develop nausea and vomiting, or diarrhea that is not controlled by your medication, call the clinic.  The clinic phone number is [phone]. Office hours are Monday-Friday 8:30am-5:00pm.  BELOW ARE SYMPTOMS THAT SHOULD BE REPORTED IMMEDIATELY:  *FEVER GREATER THAN 101.0 F  *CHILLS WITH OR WITHOUT FEVER  NAUSEA AND VOMITING THAT IS NOT CONTROLLED WITH YOUR NAUSEA MEDICATION  *UNUSUAL SHORTNESS OF BREATH  *UNUSUAL BRUISING OR BLEEDING  TENDERNESS IN MOUTH AND THROAT WITH OR WITHOUT PRESENCE OF ULCERS  *URINARY PROBLEMS  *BOWEL PROBLEMS  UNUSUAL RASH Items with * indicate a potential emergency and should be followed up as soon as possible. If you have an emergency after office hours please contact your primary care physician or go to the nearest emergency department.  Please call the clinic during office hours if you have any questions or concerns.   You may also contact the Patient Navigator at [phone] should you have any questions or need assistance in obtaining follow up care.      Resources For Cancer Patients and their Caregivers ? American Cancer Society: Can assist with transportation, wigs, general needs, runs Look Good Feel Better.        1-888-227-6333 ? Cancer Care: Provides financial assistance, online support groups, medication/co-pay assistance.  1-800-813-HOPE (4673) ? [facility] Assists [place] cancer  patients and their families through emotional , educational and financial support.  336-427-4357 ? [place] DSS Where to apply for food stamps, Medicaid and utility assistance. 336-342-1394 ? [facility]: Transportation to medical appointments. 336-347-2287 ? Social Security Administration: May apply for disability if have a Stage IV cancer. 336-342-7796 1-800-772-1213 ? [place] Aging, Disability and Transit Services: Assists with nutrition, care and transit needs. [phone]         

## 2017-03-12 ENCOUNTER — Encounter (HOSPITAL_COMMUNITY): Payer: Self-pay

## 2017-03-23 ENCOUNTER — Encounter (HOSPITAL_BASED_OUTPATIENT_CLINIC_OR_DEPARTMENT_OTHER): Payer: Medicare Other | Admitting: Hematology and Oncology

## 2017-03-23 ENCOUNTER — Encounter (HOSPITAL_BASED_OUTPATIENT_CLINIC_OR_DEPARTMENT_OTHER): Payer: Medicare Other

## 2017-03-23 ENCOUNTER — Encounter (HOSPITAL_COMMUNITY): Payer: Self-pay | Admitting: Hematology and Oncology

## 2017-03-23 ENCOUNTER — Other Ambulatory Visit: Payer: Self-pay

## 2017-03-23 VITALS — BP 116/55 | HR 66 | Temp 97.4°F | Resp 18 | Wt 204.2 lb

## 2017-03-23 DIAGNOSIS — Z5112 Encounter for antineoplastic immunotherapy: Secondary | ICD-10-CM | POA: Diagnosis present

## 2017-03-23 DIAGNOSIS — C184 Malignant neoplasm of transverse colon: Secondary | ICD-10-CM

## 2017-03-23 LAB — COMPREHENSIVE METABOLIC PANEL
ALT: 21 U/L (ref 17–63)
AST: 26 U/L (ref 15–41)
Albumin: 3.6 g/dL (ref 3.5–5.0)
Alkaline Phosphatase: 48 U/L (ref 38–126)
Anion gap: 11 (ref 5–15)
BUN: 13 mg/dL (ref 6–20)
CO2: 25 mmol/L (ref 22–32)
Calcium: 9.1 mg/dL (ref 8.9–10.3)
Chloride: 100 mmol/L — ABNORMAL LOW (ref 101–111)
Creatinine, Ser: 0.75 mg/dL (ref 0.61–1.24)
GFR calc Af Amer: >60 mL/min (ref >60)
GFR calc non Af Amer: >60 mL/min (ref >60)
Glucose, Bld: 142 mg/dL — ABNORMAL HIGH (ref 65–99)
Potassium: 3.7 mmol/L (ref 3.5–5.1)
Sodium: 136 mmol/L (ref 135–145)
Total Bilirubin: 0.6 mg/dL (ref 0.3–1.2)
Total Protein: 7.1 g/dL (ref 6.5–8.1)

## 2017-03-23 LAB — CBC WITH DIFFERENTIAL/PLATELET
Basophils Absolute: 0 K/uL (ref 0.0–0.1)
Basophils Relative: 0 %
Eosinophils Absolute: 0.3 K/uL (ref 0.0–0.7)
Eosinophils Relative: 4 %
HCT: 38.2 % — ABNORMAL LOW (ref 39.0–52.0)
Hemoglobin: 12.2 g/dL — ABNORMAL LOW (ref 13.0–17.0)
Lymphocytes Relative: 29 %
Lymphs Abs: 2.7 K/uL (ref 0.7–4.0)
MCH: 33.2 pg (ref 26.0–34.0)
MCHC: 31.9 g/dL (ref 30.0–36.0)
MCV: 103.8 fL — ABNORMAL HIGH (ref 78.0–100.0)
Monocytes Absolute: 1 K/uL (ref 0.1–1.0)
Monocytes Relative: 11 %
Neutro Abs: 5.1 K/uL (ref 1.7–7.7)
Neutrophils Relative %: 56 %
Platelets: 208 K/uL (ref 150–400)
RBC: 3.68 MIL/uL — ABNORMAL LOW (ref 4.22–5.81)
RDW: 12.7 % (ref 11.5–15.5)
WBC: 9.2 K/uL (ref 4.0–10.5)

## 2017-03-23 MED ORDER — HEPARIN SOD (PORK) LOCK FLUSH 100 UNIT/ML IV SOLN
500.0000 [IU] | Freq: Once | INTRAVENOUS | Status: AC | PRN
  Administered 2017-03-23: 500 [IU]

## 2017-03-23 MED ORDER — SODIUM CHLORIDE 0.9 % IV SOLN
240.0000 mg | Freq: Once | INTRAVENOUS | Status: AC
  Administered 2017-03-23: 240 mg via INTRAVENOUS
  Filled 2017-03-23: qty 24

## 2017-03-23 MED ORDER — SODIUM CHLORIDE 0.9 % IV SOLN
Freq: Once | INTRAVENOUS | Status: AC
  Administered 2017-03-23: 12:00:00 via INTRAVENOUS

## 2017-03-23 NOTE — Progress Notes (Signed)
Tolerated infusion w/o adverse reaction.  Alert, in no distress.  VSS.  Discharged ambulatory in c/o friend.  

## 2017-03-24 LAB — CEA: CEA: 4.3 ng/mL (ref 0.0–4.7)

## 2017-03-29 NOTE — Progress Notes (Signed)
[name] Follow-up Visit:  Assessment: Malignant neoplasm of transverse colon [name]) 84 y.o. male with recurrent carcinoma of the colon, currently undergoing palliative immunotherapy with no vola Mab.  Patient has received 2 cycles of therapy without significant complications.  No new symptoms to suggest treatment toxicity or progressive disease at this time.  Clinical evaluation lab workup permissive to proceed with the next cycle of immunotherapy.  Plan: --Proceed with cycle #3 of nivolumab --Return to clinic in 2 weeks for  labs and possible fourth cycle of immunotherapy --Next disease assessment with PET/CT after 4 cycles of treatment.  Voice recognition software was used and creation of this note. Despite my best effort at editing the text, some misspelling/errors may have occurred.  Orders Placed This Encounter  Procedures  . CBC with Differential (Cancer Center Only)    Standing Status:   Future    Standing Expiration Date:   03/29/2018  . CMP ([place] only)    Standing Status:   Future    Standing Expiration Date:   03/29/2018  . TSH    Standing Status:   Future    Standing Expiration Date:   03/29/2018    Cancer Staging Malignant neoplasm of transverse colon [facility]) Staging form: Colon and Rectum, AJCC 8th Edition - Clinical: cT3, cN1a, cM1 - Signed by Twana First, MD on 03/09/2017 - Pathologic: No stage assigned - Unsigned   All questions were answered.  . The patient knows to call the clinic with any problems, questions or concerns.  This note was electronically signed.    History of Presenting Illness Kevin Kirk 84 y.o. presenting to the [facility] for cycle 3 of palliative systemic immunotherapy with nivolumab for diagnosis of recurrent colon cancer.  Patient appears to have tolerated previous cycles of therapy quite well.  In the interim, he denies any new abdominal discomfort, diarrhea, shortness of breath, abdominal pain, headache,  or neck swelling.   Medical History: Past Medical History:  Diagnosis Date  . Chronic pulmonary embolism ([place]) 40/8144   compliction of the valve replacement surgery  . COPD (chronic obstructive pulmonary disease) ([place])   . Diabetes ([place]) 04/22/2016  . H/O aortic valve replacement 10/2008  . Hypercholesteremia   . Hypertension   . Pulmonary emboli ([place]) 10/2008  . Sleep apnea     Surgical History: Past Surgical History:  Procedure Laterality Date  . AORTIC VALVE REPLACEMENT     2010  . CARDIAC CATHETERIZATION  2010  . COLONOSCOPY N/A 06/05/2016   Procedure: COLONOSCOPY;  Surgeon: Rogene Houston, MD;  Location: [facility] END[facility] SU[facility];  Service: Endoscopy;  Laterality: N/A;  . PARTIAL COLECTOMY N/A 07/11/2016   Procedure: PARTIAL COLECTOMY;  Surgeon: Aviva Signs, MD;  Location: [facility] ORS;  Service: General;  Laterality: N/A;  . PORTACATH PLACEMENT N/A 08/13/2016   Procedure: INSERTION PORT-A-CATH LEFT SUBCLAVIAN;  Surgeon: Aviva Signs, MD;  Location: [facility] ORS;  Service: General;  Laterality: N/A;  . [place]     from trauma    Family History: Family History  Problem Relation Age of Onset  . Colon cancer Neg Hx     Social History: Social History   Socioeconomic History  . Marital status: Widowed    Spouse name: Not on file  . Number of children: Not on file  . Years of education: Not on file  . Highest education level: Not on file  Social Needs  . Financial  resource strain: Not on file  . Food insecurity - worry: Not on file  . Food insecurity - inability: Not on file  . Transportation needs - medical: Not on file  . Transportation needs - non-medical: Not on file  Occupational History  . Not on file  Tobacco Use  . Smoking status: Former Smoker    Years: 15.00    Types: Cigarettes    Last attempt to quit: 1964    Years since quitting: 55.0  . Smokeless tobacco: Never Used  Substance and Sexual Activity  . Alcohol use: No  . Drug  use: No  . Sexual activity: Yes  Other Topics Concern  . Not on file  Social History Narrative  . Not on file    Allergies: Allergies  Allergen Reactions  . Amoxicillin Hives    Has patient had a PCN reaction causing immediate rash, facial/tongue/throat swelling, SOB or lightheadedness with hypotension: No Has patient had a PCN reaction causing severe rash involving mucus membranes or skin necrosis: Yes Has patient had a PCN reaction that required hospitalization No Has patient had a PCN reaction occurring within the last 10 years: No If all of the above answers are "NO", then may proceed with Cephalosporin use.   . Tamsulosin Hcl     Cant sleep, confusion     Medications:  Current Outpatient Medications  Medication Sig Dispense Refill  . aspirin EC 81 MG tablet Take 81 mg by mouth daily.    Marland Kitchen atorvastatin (LIPITOR) 10 MG tablet Take 10 mg by mouth at bedtime.     . Cholecalciferol (VITAMIN D3) 5000 units CAPS Take 5,000 Units by mouth daily.     . Diphenhyd-Hydrocort-Nystatin (FIRST-DUKES MOUTHWASH) SUSP Use as directed 5 mLs in the mouth or throat 4 (four) times daily as needed. 237 mL 3  . diphenoxylate-atropine (LOMOTIL) 2.5-0.025 MG tablet Take 1 tablet by mouth 4 (four) times daily as needed for diarrhea or loose stools. 60 tablet 0  . finasteride (PROSCAR) 5 MG tablet Take 5 mg by mouth at bedtime.     . gabapentin (NEURONTIN) 300 MG capsule Take 1 capsule (300 mg total) by mouth 3 (three) times daily. 90 capsule 2  . lisinopril (PRINIVIL,ZESTRIL) 10 MG tablet     . lisinopril (PRINIVIL,ZESTRIL) 5 MG tablet Take 5 mg by mouth at bedtime.     . metFORMIN (GLUCOPHAGE) 1000 MG tablet     . metFORMIN (GLUCOPHAGE) 500 MG tablet Take 500 mg by mouth 2 (two) times daily.    . metoprolol (LOPRESSOR) 50 MG tablet Take 50 mg by mouth 2 (two) times daily.    . Nivolumab (OPDIVO IV) Inject into the vein. Every 2 weeks    . vitamin B-12 (CYANOCOBALAMIN) 1000 MCG tablet Take 1,000 mcg  by mouth daily.      No current facility-administered medications for this visit.     Review of Systems: Review of Systems  All other systems reviewed and are negative.    PHYSICAL EXAMINATION There were no vitals taken for this visit.  ECOG PERFORMANCE STATUS: 2 - Symptomatic, <50% confined to bed  Physical Exam  Constitutional: He is oriented to person, place, and time and well-developed, well-nourished, and in no distress. No distress.  HENT:  Head: Normocephalic and atraumatic.  Mouth/Throat: Oropharynx is clear and moist. No oropharyngeal exudate.  Eyes: Conjunctivae and EOM are normal. Pupils are equal, round, and reactive to light. No scleral icterus.  Neck: No thyromegaly present.  Cardiovascular: Normal rate,  regular rhythm and normal heart sounds.  No murmur heard. Pulmonary/Chest: Effort normal and breath sounds normal. No respiratory distress. He has no wheezes. He has no rales.  Abdominal: Soft. Bowel sounds are normal. He exhibits no distension. There is no tenderness. There is no rebound.  Musculoskeletal: He exhibits no edema.  Lymphadenopathy:    He has no cervical adenopathy.  Neurological: He is alert and oriented to person, place, and time. He has normal reflexes. No cranial nerve deficit.  Skin: Skin is warm and dry. No rash noted. He is not diaphoretic. No erythema.     LABORATORY DATA: I have personally reviewed the data as listed: Infusion on 03/23/2017  Component Date Value Ref Range Status  . Sodium 03/23/2017 136  135 - 145 mmol/L Final  . Potassium 03/23/2017 3.7  3.5 - 5.1 mmol/L Final  . Chloride 03/23/2017 100* 101 - 111 mmol/L Final  . CO2 03/23/2017 25  22 - 32 mmol/L Final  . Glucose, Bld 03/23/2017 142* 65 - 99 mg/dL Final  . BUN 03/23/2017 13  6 - 20 mg/dL Final  . Creatinine, Ser 03/23/2017 0.75  0.61 - 1.24 mg/dL Final  . Calcium 03/23/2017 9.1  8.9 - 10.3 mg/dL Final  . Total Protein 03/23/2017 7.1  6.5 - 8.1 g/dL Final  . Albumin  03/23/2017 3.6  3.5 - 5.0 g/dL Final  . AST 03/23/2017 26  15 - 41 U/L Final  . ALT 03/23/2017 21  17 - 63 U/L Final  . Alkaline Phosphatase 03/23/2017 48  38 - 126 U/L Final  . Total Bilirubin 03/23/2017 0.6  0.3 - 1.2 mg/dL Final  . GFR calc non Af Amer 03/23/2017 >60  >60 mL/min Final  . GFR calc Af Amer 03/23/2017 >60  >60 mL/min Final   Comment: (NOTE) The eGFR has been calculated using the CKD EPI equation. This calculation has not been validated in all clinical situations. eGFR's persistently <60 mL/min signify possible Chronic Kidney Disease.   . Anion gap 03/23/2017 11  5 - 15 Final  . WBC 03/23/2017 9.2  4.0 - 10.5 K/uL Final  . RBC 03/23/2017 3.68* 4.22 - 5.81 MIL/uL Final  . Hemoglobin 03/23/2017 12.2* 13.0 - 17.0 g/dL Final  . HCT 03/23/2017 38.2* 39.0 - 52.0 % Final  . MCV 03/23/2017 103.8* 78.0 - 100.0 fL Final  . MCH 03/23/2017 33.2  26.0 - 34.0 pg Final  . MCHC 03/23/2017 31.9  30.0 - 36.0 g/dL Final  . RDW 03/23/2017 12.7  11.5 - 15.5 % Final  . Platelets 03/23/2017 208  150 - 400 K/uL Final  . Neutrophils Relative % 03/23/2017 56  % Final  . Neutro Abs 03/23/2017 5.1  1.7 - 7.7 K/uL Final  . Lymphocytes Relative 03/23/2017 29  % Final  . Lymphs Abs 03/23/2017 2.7  0.7 - 4.0 K/uL Final  . Monocytes Relative 03/23/2017 11  % Final  . Monocytes Absolute 03/23/2017 1.0  0.1 - 1.0 K/uL Final  . Eosinophils Relative 03/23/2017 4  % Final  . Eosinophils Absolute 03/23/2017 0.3  0.0 - 0.7 K/uL Final  . Basophils Relative 03/23/2017 0  % Final  . Basophils Absolute 03/23/2017 0.0  0.0 - 0.1 K/uL Final  . CEA 03/23/2017 4.3  0.0 - 4.7 ng/mL Final   Comment: (NOTE)       Roche ECLIA methodology       Nonsmokers  <3.9  Smokers     <5.6 Performed At: [facility] [place], [place] [id number] Rush Farmer MD YH:[id number]        Ardath Sax, MD

## 2017-03-29 NOTE — Assessment & Plan Note (Signed)
84 y.o. male with recurrent carcinoma of the colon, currently undergoing palliative immunotherapy with no vola Mab.  Patient has received 2 cycles of therapy without significant complications.  No new symptoms to suggest treatment toxicity or progressive disease at this time.  Clinical evaluation lab workup permissive to proceed with the next cycle of immunotherapy.  Plan: --Proceed with cycle #3 of nivolumab --Return to clinic in 2 weeks for  labs and possible fourth cycle of immunotherapy --Next disease assessment with PET/CT after 4 cycles of treatment.

## 2017-04-06 ENCOUNTER — Ambulatory Visit (HOSPITAL_COMMUNITY): Payer: Medicare Other | Admitting: Hematology and Oncology

## 2017-04-06 ENCOUNTER — Other Ambulatory Visit: Payer: Self-pay

## 2017-04-06 ENCOUNTER — Inpatient Hospital Stay (HOSPITAL_COMMUNITY): Payer: Medicare Other | Attending: Hematology and Oncology

## 2017-04-06 ENCOUNTER — Encounter (HOSPITAL_COMMUNITY): Payer: Self-pay | Admitting: Hematology and Oncology

## 2017-04-06 ENCOUNTER — Inpatient Hospital Stay (HOSPITAL_BASED_OUTPATIENT_CLINIC_OR_DEPARTMENT_OTHER): Payer: Medicare Other | Admitting: Hematology and Oncology

## 2017-04-06 VITALS — BP 105/59 | HR 69 | Temp 97.8°F | Resp 16 | Wt 202.4 lb

## 2017-04-06 DIAGNOSIS — Z87891 Personal history of nicotine dependence: Secondary | ICD-10-CM | POA: Diagnosis not present

## 2017-04-06 DIAGNOSIS — E7800 Pure hypercholesterolemia, unspecified: Secondary | ICD-10-CM | POA: Diagnosis not present

## 2017-04-06 DIAGNOSIS — G4730 Sleep apnea, unspecified: Secondary | ICD-10-CM | POA: Diagnosis not present

## 2017-04-06 DIAGNOSIS — E119 Type 2 diabetes mellitus without complications: Secondary | ICD-10-CM | POA: Diagnosis not present

## 2017-04-06 DIAGNOSIS — C184 Malignant neoplasm of transverse colon: Secondary | ICD-10-CM

## 2017-04-06 DIAGNOSIS — Z79899 Other long term (current) drug therapy: Secondary | ICD-10-CM | POA: Diagnosis not present

## 2017-04-06 DIAGNOSIS — J449 Chronic obstructive pulmonary disease, unspecified: Secondary | ICD-10-CM | POA: Diagnosis not present

## 2017-04-06 DIAGNOSIS — Z9221 Personal history of antineoplastic chemotherapy: Secondary | ICD-10-CM | POA: Diagnosis not present

## 2017-04-06 DIAGNOSIS — Z7982 Long term (current) use of aspirin: Secondary | ICD-10-CM | POA: Diagnosis not present

## 2017-04-06 DIAGNOSIS — Z5112 Encounter for antineoplastic immunotherapy: Secondary | ICD-10-CM | POA: Insufficient documentation

## 2017-04-06 DIAGNOSIS — Z952 Presence of prosthetic heart valve: Secondary | ICD-10-CM | POA: Insufficient documentation

## 2017-04-06 DIAGNOSIS — Z86711 Personal history of pulmonary embolism: Secondary | ICD-10-CM | POA: Insufficient documentation

## 2017-04-06 DIAGNOSIS — I10 Essential (primary) hypertension: Secondary | ICD-10-CM | POA: Insufficient documentation

## 2017-04-06 DIAGNOSIS — Z7984 Long term (current) use of oral hypoglycemic drugs: Secondary | ICD-10-CM | POA: Diagnosis not present

## 2017-04-06 LAB — CBC WITH DIFFERENTIAL/PLATELET
Basophils Absolute: 0 10*3/uL (ref 0.0–0.1)
Basophils Relative: 0 %
Eosinophils Absolute: 0.5 10*3/uL (ref 0.0–0.7)
Eosinophils Relative: 5 %
HCT: 38.8 % — ABNORMAL LOW (ref 39.0–52.0)
Hemoglobin: 12.3 g/dL — ABNORMAL LOW (ref 13.0–17.0)
Lymphocytes Relative: 32 %
Lymphs Abs: 3.1 10*3/uL (ref 0.7–4.0)
MCH: 32.5 pg (ref 26.0–34.0)
MCHC: 31.7 g/dL (ref 30.0–36.0)
MCV: 102.4 fL — ABNORMAL HIGH (ref 78.0–100.0)
Monocytes Absolute: 1 10*3/uL (ref 0.1–1.0)
Monocytes Relative: 11 %
Neutro Abs: 5.1 10*3/uL (ref 1.7–7.7)
Neutrophils Relative %: 52 %
Platelets: 202 10*3/uL (ref 150–400)
RBC: 3.79 MIL/uL — ABNORMAL LOW (ref 4.22–5.81)
RDW: 12.4 % (ref 11.5–15.5)
WBC: 9.7 10*3/uL (ref 4.0–10.5)

## 2017-04-06 LAB — COMPREHENSIVE METABOLIC PANEL
ALT: 23 U/L (ref 17–63)
AST: 25 U/L (ref 15–41)
Albumin: 3.6 g/dL (ref 3.5–5.0)
Alkaline Phosphatase: 46 U/L (ref 38–126)
Anion gap: 9 (ref 5–15)
BUN: 17 mg/dL (ref 6–20)
CO2: 25 mmol/L (ref 22–32)
Calcium: 9 mg/dL (ref 8.9–10.3)
Chloride: 102 mmol/L (ref 101–111)
Creatinine, Ser: 0.92 mg/dL (ref 0.61–1.24)
GFR calc Af Amer: >60 mL/min (ref >60)
GFR calc non Af Amer: >60 mL/min (ref >60)
Glucose, Bld: 127 mg/dL — ABNORMAL HIGH (ref 65–99)
Potassium: 4.4 mmol/L (ref 3.5–5.1)
Sodium: 136 mmol/L (ref 135–145)
Total Bilirubin: 0.6 mg/dL (ref 0.3–1.2)
Total Protein: 7.1 g/dL (ref 6.5–8.1)

## 2017-04-06 MED ORDER — SODIUM CHLORIDE 0.9% FLUSH
10.0000 mL | INTRAVENOUS | Status: DC | PRN
  Administered 2017-04-06: 10 mL
  Filled 2017-04-06: qty 10

## 2017-04-06 MED ORDER — SODIUM CHLORIDE 0.9 % IV SOLN
Freq: Once | INTRAVENOUS | Status: AC
  Administered 2017-04-06: 12:00:00 via INTRAVENOUS

## 2017-04-06 MED ORDER — NIVOLUMAB CHEMO INJECTION 100 MG/10ML
240.0000 mg | Freq: Once | INTRAVENOUS | Status: AC
  Administered 2017-04-06: 240 mg via INTRAVENOUS
  Filled 2017-04-06: qty 24

## 2017-04-06 MED ORDER — HEPARIN SOD (PORK) LOCK FLUSH 100 UNIT/ML IV SOLN
500.0000 [IU] | Freq: Once | INTRAVENOUS | Status: AC | PRN
  Administered 2017-04-06: 500 [IU]

## 2017-04-06 NOTE — Progress Notes (Signed)
Treatment given per orders. Patient tolerated it well without problems. Vitals stable and discharged home from clinic ambulatory. Follow up as scheduled.  

## 2017-04-06 NOTE — Patient Instructions (Signed)
[facility] Discharge Instructions for Patients Receiving Chemotherapy   Beginning January 23rd 2017 lab work for the Cancer Center will be done in the  Main lab at [facility] on 1st floor. If you have a lab appointment with the Cancer Center please come in thru the  Main Entrance and check in at the main information desk   Today you received the following chemotherapy agents   To help prevent nausea and vomiting after your treatment, we encourage you to take your nausea medication     If you develop nausea and vomiting, or diarrhea that is not controlled by your medication, call the clinic.  The clinic phone number is [phone]. Office hours are Monday-Friday 8:30am-5:00pm.  BELOW ARE SYMPTOMS THAT SHOULD BE REPORTED IMMEDIATELY:  *FEVER GREATER THAN 101.0 F  *CHILLS WITH OR WITHOUT FEVER  NAUSEA AND VOMITING THAT IS NOT CONTROLLED WITH YOUR NAUSEA MEDICATION  *UNUSUAL SHORTNESS OF BREATH  *UNUSUAL BRUISING OR BLEEDING  TENDERNESS IN MOUTH AND THROAT WITH OR WITHOUT PRESENCE OF ULCERS  *URINARY PROBLEMS  *BOWEL PROBLEMS  UNUSUAL RASH Items with * indicate a potential emergency and should be followed up as soon as possible. If you have an emergency after office hours please contact your primary care physician or go to the nearest emergency department.  Please call the clinic during office hours if you have any questions or concerns.   You may also contact the Patient Navigator at [phone] should you have any questions or need assistance in obtaining follow up care.      Resources For Cancer Patients and their Caregivers ? American Cancer Society: Can assist with transportation, wigs, general needs, runs Look Good Feel Better.        1-888-227-6333 ? Cancer Care: Provides financial assistance, online support groups, medication/co-pay assistance.  1-800-813-HOPE (4673) ? [facility] Assists [place] cancer  patients and their families through emotional , educational and financial support.  336-427-4357 ? [place] DSS Where to apply for food stamps, Medicaid and utility assistance. 336-342-1394 ? [facility]: Transportation to medical appointments. 336-347-2287 ? Social Security Administration: May apply for disability if have a Stage IV cancer. 336-342-7796 1-800-772-1213 ? [place] Aging, Disability and Transit Services: Assists with nutrition, care and transit needs. 336-349-2343         

## 2017-04-17 ENCOUNTER — Encounter (HOSPITAL_COMMUNITY)
Admission: RE | Admit: 2017-04-17 | Discharge: 2017-04-17 | Disposition: A | Discharge status: Home / Self Care | Payer: Medicare Other | Source: Ambulatory Visit | Attending: Hematology and Oncology | Admitting: Hematology and Oncology

## 2017-04-17 DIAGNOSIS — C184 Malignant neoplasm of transverse colon: Secondary | ICD-10-CM | POA: Insufficient documentation

## 2017-04-17 LAB — GLUCOSE, CAPILLARY: Glucose-Capillary: 126 mg/dL — ABNORMAL HIGH (ref 65–99)

## 2017-04-17 MED ORDER — FLUDEOXYGLUCOSE F - 18 (FDG) INJECTION
10.0900 | Freq: Once | INTRAVENOUS | Status: AC | PRN
  Administered 2017-04-17: 10.09 via INTRAVENOUS

## 2017-04-19 NOTE — Progress Notes (Signed)
[facility] Follow-up Visit:  Assessment: Malignant neoplasm of transverse colon [facility]) 84 y.o. male with recurrent carcinoma of the colon, currently undergoing palliative immunotherapy with no vola Mab.  Patient has received 3 cycles of therapy without significant complications.  No new symptoms to suggest treatment toxicity or progressive disease at this time.  Clinical evaluation lab workup permissive to proceed with the next cycle of immunotherapy.  Plan: -Proceed with cycle #4 of nivolumab -PET/CT for disease/treatment response assessment prior to next visit in the clinic -Return to clinic in 2 weeks for possible next cycle of systemic immunotherapy. Voice recognition software was used and creation of this note. Despite my best effort at editing the text, some misspelling/errors may have occurred.  Orders Placed This Encounter  Procedures  . NM PET Image Restag (PS) Skull Base To Thigh    Standing Status:   Future    Number of Occurrences:   1    Standing Expiration Date:   04/06/2018    Order Specific Question:   If indicated for the ordered procedure, I authorize the administration of a radiopharmaceutical per Radiology protocol    Answer:   Yes    Order Specific Question:   Preferred imaging location?    Answer:   [facility]    Order Specific Question:   Radiology Contrast Protocol - do NOT remove file path    Answer:   [email or web address]    Order Specific Question:   Reason for Exam additional comments    Answer:   Restaging colon cancer on therapy  . CBC with Differential    Standing Status:   Future    Standing Expiration Date:   04/06/2018  . Comprehensive metabolic panel    Standing Status:   Future    Standing Expiration Date:   04/06/2018  . TSH    Standing Status:   Future    Standing Expiration Date:   04/06/2018    Cancer Staging Malignant neoplasm of transverse colon [facility]) Staging form: Colon and Rectum,  AJCC 8th Edition - Clinical: cT3, cN1a, cM1 - Signed by Twana First, MD on 03/09/2017 - Pathologic: No stage assigned - Unsigned   All questions were answered.  . The patient knows to call the clinic with any problems, questions or concerns.  This note was electronically signed.    History of Presenting Illness Kevin Kirk 84 y.o. presenting to the [facility] for cycle 4 of palliative systemic immunotherapy with nivolumab for diagnosis of recurrent colon cancer.  Patient appears to have tolerated previous cycles of therapy quite well.  In the interim, he denies any new abdominal discomfort, diarrhea, shortness of breath, abdominal pain, headache, or neck swelling.   Medical History: Past Medical History:  Diagnosis Date  . Chronic pulmonary embolism ([place]) 75/1700   compliction of the valve replacement surgery  . COPD (chronic obstructive pulmonary disease) ([place])   . Diabetes ([place]) 04/22/2016  . H/O aortic valve replacement 10/2008  . Hypercholesteremia   . Hypertension   . Pulmonary emboli ([place]) 10/2008  . Sleep apnea     Surgical History: Past Surgical History:  Procedure Laterality Date  . AORTIC VALVE REPLACEMENT     2010  . CARDIAC CATHETERIZATION  2010  . COLONOSCOPY N/A 06/05/2016   Procedure: COLONOSCOPY;  Surgeon: Rogene Houston, MD;  [facility];  Service: Endoscopy;  Laterality: N/A;  . PARTIAL COLECTOMY N/A 07/11/2016   Procedure: PARTIAL COLECTOMY;  Surgeon: Aviva Signs, MD;  Location: [facility] ORS;  Service: General;  Laterality: N/A;  . PORTACATH PLACEMENT N/A 08/13/2016   Procedure: INSERTION PORT-A-CATH LEFT SUBCLAVIAN;  Surgeon: Aviva Signs, MD;  Location: [facility] ORS;  Service: General;  Laterality: N/A;  . [place]     from trauma    Family History: Family History  Problem Relation Age of Onset  . Colon cancer Neg Hx     Social History: Social History   Socioeconomic History  . Marital status:  Widowed    Spouse name: Not on file  . Number of children: Not on file  . Years of education: Not on file  . Highest education level: Not on file  Social Needs  . Financial resource strain: Not on file  . Food insecurity - worry: Not on file  . Food insecurity - inability: Not on file  . Transportation needs - medical: Not on file  . Transportation needs - non-medical: Not on file  Occupational History  . Not on file  Tobacco Use  . Smoking status: Former Smoker    Years: 15.00    Types: Cigarettes    Last attempt to quit: 1964    Years since quitting: 55.1  . Smokeless tobacco: Never Used  Substance and Sexual Activity  . Alcohol use: No  . Drug use: No  . Sexual activity: Yes  Other Topics Concern  . Not on file  Social History Narrative  . Not on file    Allergies: Allergies  Allergen Reactions  . Amoxicillin Hives    Has patient had a PCN reaction causing immediate rash, facial/tongue/throat swelling, SOB or lightheadedness with hypotension: No Has patient had a PCN reaction causing severe rash involving mucus membranes or skin necrosis: Yes Has patient had a PCN reaction that required hospitalization No Has patient had a PCN reaction occurring within the last 10 years: No If all of the above answers are "NO", then may proceed with Cephalosporin use.   . Tamsulosin Hcl     Cant sleep, confusion     Medications:  Current Outpatient Medications  Medication Sig Dispense Refill  . aspirin EC 81 MG tablet Take 81 mg by mouth daily.    Marland Kitchen atorvastatin (LIPITOR) 10 MG tablet Take 10 mg by mouth at bedtime.     . Cholecalciferol (VITAMIN D3) 5000 units CAPS Take 5,000 Units by mouth daily.     . Diphenhyd-Hydrocort-Nystatin (FIRST-DUKES MOUTHWASH) SUSP Use as directed 5 mLs in the mouth or throat 4 (four) times daily as needed. 237 mL 3  . diphenoxylate-atropine (LOMOTIL) 2.5-0.025 MG tablet Take 1 tablet by mouth 4 (four) times daily as needed for diarrhea or loose  stools. 60 tablet 0  . finasteride (PROSCAR) 5 MG tablet Take 5 mg by mouth at bedtime.     . gabapentin (NEURONTIN) 300 MG capsule Take 1 capsule (300 mg total) by mouth 3 (three) times daily. 90 capsule 2  . lisinopril (PRINIVIL,ZESTRIL) 10 MG tablet     . lisinopril (PRINIVIL,ZESTRIL) 5 MG tablet Take 5 mg by mouth at bedtime.     . metFORMIN (GLUCOPHAGE) 1000 MG tablet     . metFORMIN (GLUCOPHAGE) 500 MG tablet Take 500 mg by mouth 2 (two) times daily.    . metoprolol (LOPRESSOR) 50 MG tablet Take 50 mg by mouth 2 (two) times daily.    . Nivolumab (OPDIVO IV) Inject into the vein. Every 2 weeks    . vitamin B-12 (  CYANOCOBALAMIN) 1000 MCG tablet Take 1,000 mcg by mouth daily.      No current facility-administered medications for this visit.     Review of Systems: Review of Systems  All other systems reviewed and are negative.    PHYSICAL EXAMINATION There were no vitals taken for this visit.  ECOG PERFORMANCE STATUS: 2 - Symptomatic, <50% confined to bed  Physical Exam  Constitutional: He is oriented to person, place, and time and well-developed, well-nourished, and in no distress. No distress.  HENT:  Head: Normocephalic and atraumatic.  Mouth/Throat: Oropharynx is clear and moist. No oropharyngeal exudate.  Eyes: Conjunctivae and EOM are normal. Pupils are equal, round, and reactive to light. No scleral icterus.  Neck: No thyromegaly present.  Cardiovascular: Normal rate, regular rhythm and normal heart sounds.  No murmur heard. Pulmonary/Chest: Effort normal and breath sounds normal. No respiratory distress. He has no wheezes. He has no rales.  Abdominal: Soft. Bowel sounds are normal. He exhibits no distension. There is no tenderness. There is no rebound.  Musculoskeletal: He exhibits no edema.  Lymphadenopathy:    He has no cervical adenopathy.  Neurological: He is alert and oriented to person, place, and time. He has normal reflexes. No cranial nerve deficit.  Skin:  Skin is warm and dry. No rash noted. He is not diaphoretic. No erythema.     LABORATORY DATA: I have personally reviewed the data as listed: Infusion on 04/06/2017  Component Date Value Ref Range Status  . Sodium 04/06/2017 136  135 - 145 mmol/L Final  . Potassium 04/06/2017 4.4  3.5 - 5.1 mmol/L Final  . Chloride 04/06/2017 102  101 - 111 mmol/L Final  . CO2 04/06/2017 25  22 - 32 mmol/L Final  . Glucose, Bld 04/06/2017 127* 65 - 99 mg/dL Final  . BUN 04/06/2017 17  6 - 20 mg/dL Final  . Creatinine, Ser 04/06/2017 0.92  0.61 - 1.24 mg/dL Final  . Calcium 04/06/2017 9.0  8.9 - 10.3 mg/dL Final  . Total Protein 04/06/2017 7.1  6.5 - 8.1 g/dL Final  . Albumin 04/06/2017 3.6  3.5 - 5.0 g/dL Final  . AST 04/06/2017 25  15 - 41 U/L Final  . ALT 04/06/2017 23  17 - 63 U/L Final  . Alkaline Phosphatase 04/06/2017 46  38 - 126 U/L Final  . Total Bilirubin 04/06/2017 0.6  0.3 - 1.2 mg/dL Final  . GFR calc non Af Amer 04/06/2017 >60  >60 mL/min Final  . GFR calc Af Amer 04/06/2017 >60  >60 mL/min Final   Comment: (NOTE) The eGFR has been calculated using the CKD EPI equation. This calculation has not been validated in all clinical situations. eGFR's persistently <60 mL/min signify possible Chronic Kidney Disease.   . Anion gap 04/06/2017 9  5 - 15 Final  . WBC 04/06/2017 9.7  4.0 - 10.5 K/uL Final  . RBC 04/06/2017 3.79* 4.22 - 5.81 MIL/uL Final  . Hemoglobin 04/06/2017 12.3* 13.0 - 17.0 g/dL Final  . HCT 04/06/2017 38.8* 39.0 - 52.0 % Final  . MCV 04/06/2017 102.4* 78.0 - 100.0 fL Final  . MCH 04/06/2017 32.5  26.0 - 34.0 pg Final  . MCHC 04/06/2017 31.7  30.0 - 36.0 g/dL Final  . RDW 04/06/2017 12.4  11.5 - 15.5 % Final  . Platelets 04/06/2017 202  150 - 400 K/uL Final  . Neutrophils Relative % 04/06/2017 52  % Final  . Neutro Abs 04/06/2017 5.1  1.7 - 7.7 K/uL Final  .  Lymphocytes Relative 04/06/2017 32  % Final  . Lymphs Abs 04/06/2017 3.1  0.7 - 4.0 K/uL Final  . Monocytes  Relative 04/06/2017 11  % Final  . Monocytes Absolute 04/06/2017 1.0  0.1 - 1.0 K/uL Final  . Eosinophils Relative 04/06/2017 5  % Final  . Eosinophils Absolute 04/06/2017 0.5  0.0 - 0.7 K/uL Final  . Basophils Relative 04/06/2017 0  % Final  . Basophils Absolute 04/06/2017 0.0  0.0 - 0.1 K/uL Final       Ardath Sax, MD

## 2017-04-19 NOTE — Assessment & Plan Note (Signed)
84 y.o. male with recurrent carcinoma of the colon, currently undergoing palliative immunotherapy with no vola Mab.  Patient has received 3 cycles of therapy without significant complications.  No new symptoms to suggest treatment toxicity or progressive disease at this time.  Clinical evaluation lab workup permissive to proceed with the next cycle of immunotherapy.  Plan: -Proceed with cycle #4 of nivolumab -PET/CT for disease/treatment response assessment prior to next visit in the clinic -Return to clinic in 2 weeks for possible next cycle of systemic immunotherapy.

## 2017-04-20 ENCOUNTER — Encounter (HOSPITAL_COMMUNITY): Payer: Self-pay

## 2017-04-20 ENCOUNTER — Other Ambulatory Visit: Payer: Self-pay

## 2017-04-20 ENCOUNTER — Inpatient Hospital Stay (HOSPITAL_COMMUNITY): Payer: Medicare Other

## 2017-04-20 ENCOUNTER — Inpatient Hospital Stay (HOSPITAL_BASED_OUTPATIENT_CLINIC_OR_DEPARTMENT_OTHER): Payer: Medicare Other | Admitting: Internal Medicine

## 2017-04-20 VITALS — BP 112/59 | HR 69 | Temp 97.9°F | Resp 20 | Wt 199.6 lb

## 2017-04-20 DIAGNOSIS — Z9221 Personal history of antineoplastic chemotherapy: Secondary | ICD-10-CM

## 2017-04-20 DIAGNOSIS — C184 Malignant neoplasm of transverse colon: Secondary | ICD-10-CM | POA: Diagnosis not present

## 2017-04-20 DIAGNOSIS — Z79899 Other long term (current) drug therapy: Secondary | ICD-10-CM | POA: Diagnosis not present

## 2017-04-20 DIAGNOSIS — Z5112 Encounter for antineoplastic immunotherapy: Secondary | ICD-10-CM | POA: Diagnosis not present

## 2017-04-20 DIAGNOSIS — C189 Malignant neoplasm of colon, unspecified: Secondary | ICD-10-CM

## 2017-04-20 LAB — CBC WITH DIFFERENTIAL/PLATELET
Basophils Absolute: 0 10*3/uL (ref 0.0–0.1)
Basophils Relative: 0 %
Eosinophils Absolute: 0.5 10*3/uL (ref 0.0–0.7)
Eosinophils Relative: 5 %
HCT: 38.7 % — ABNORMAL LOW (ref 39.0–52.0)
Hemoglobin: 12.5 g/dL — ABNORMAL LOW (ref 13.0–17.0)
Lymphocytes Relative: 33 %
Lymphs Abs: 3.3 10*3/uL (ref 0.7–4.0)
MCH: 32.7 pg (ref 26.0–34.0)
MCHC: 32.3 g/dL (ref 30.0–36.0)
MCV: 101.3 fL — ABNORMAL HIGH (ref 78.0–100.0)
Monocytes Absolute: 1 10*3/uL (ref 0.1–1.0)
Monocytes Relative: 10 %
Neutro Abs: 5.2 10*3/uL (ref 1.7–7.7)
Neutrophils Relative %: 52 %
Platelets: 222 10*3/uL (ref 150–400)
RBC: 3.82 MIL/uL — ABNORMAL LOW (ref 4.22–5.81)
RDW: 12.4 % (ref 11.5–15.5)
WBC: 9.9 10*3/uL (ref 4.0–10.5)

## 2017-04-20 LAB — COMPREHENSIVE METABOLIC PANEL
ALT: 26 U/L (ref 17–63)
AST: 28 U/L (ref 15–41)
Albumin: 3.9 g/dL (ref 3.5–5.0)
Alkaline Phosphatase: 43 U/L (ref 38–126)
Anion gap: 11 (ref 5–15)
BUN: 13 mg/dL (ref 6–20)
CO2: 26 mmol/L (ref 22–32)
Calcium: 9.5 mg/dL (ref 8.9–10.3)
Chloride: 99 mmol/L — ABNORMAL LOW (ref 101–111)
Creatinine, Ser: 0.78 mg/dL (ref 0.61–1.24)
GFR calc Af Amer: >60 mL/min (ref >60)
GFR calc non Af Amer: >60 mL/min (ref >60)
Glucose, Bld: 118 mg/dL — ABNORMAL HIGH (ref 65–99)
Potassium: 4.1 mmol/L (ref 3.5–5.1)
Sodium: 136 mmol/L (ref 135–145)
Total Bilirubin: 0.8 mg/dL (ref 0.3–1.2)
Total Protein: 7.2 g/dL (ref 6.5–8.1)

## 2017-04-20 LAB — TSH: TSH: 1.883 u[IU]/mL (ref 0.350–4.500)

## 2017-04-20 MED ORDER — SODIUM CHLORIDE 0.9 % IV SOLN
240.0000 mg | Freq: Once | INTRAVENOUS | Status: AC
  Administered 2017-04-20: 240 mg via INTRAVENOUS
  Filled 2017-04-20: qty 24

## 2017-04-20 MED ORDER — SODIUM CHLORIDE 0.9 % IV SOLN
Freq: Once | INTRAVENOUS | Status: AC
  Administered 2017-04-20: 14:00:00 via INTRAVENOUS

## 2017-04-20 MED ORDER — SODIUM CHLORIDE 0.9% FLUSH
10.0000 mL | INTRAVENOUS | Status: DC | PRN
  Administered 2017-04-20: 10 mL
  Filled 2017-04-20: qty 10

## 2017-04-20 MED ORDER — HEPARIN SOD (PORK) LOCK FLUSH 100 UNIT/ML IV SOLN
500.0000 [IU] | Freq: Once | INTRAVENOUS | Status: AC | PRN
  Administered 2017-04-20: 500 [IU]
  Filled 2017-04-20: qty 5

## 2017-04-20 NOTE — Patient Instructions (Signed)
[facility] Discharge Instructions for Patients Receiving Chemotherapy   Beginning January 23rd 2017 lab work for the Cancer Center will be done in the  Main lab at [facility] on 1st floor. If you have a lab appointment with the Cancer Center please come in thru the  Main Entrance and check in at the main information desk   Today you received the following chemotherapy agents   To help prevent nausea and vomiting after your treatment, we encourage you to take your nausea medication     If you develop nausea and vomiting, or diarrhea that is not controlled by your medication, call the clinic.  The clinic phone number is [phone]. Office hours are Monday-Friday 8:30am-5:00pm.  BELOW ARE SYMPTOMS THAT SHOULD BE REPORTED IMMEDIATELY:  *FEVER GREATER THAN 101.0 F  *CHILLS WITH OR WITHOUT FEVER  NAUSEA AND VOMITING THAT IS NOT CONTROLLED WITH YOUR NAUSEA MEDICATION  *UNUSUAL SHORTNESS OF BREATH  *UNUSUAL BRUISING OR BLEEDING  TENDERNESS IN MOUTH AND THROAT WITH OR WITHOUT PRESENCE OF ULCERS  *URINARY PROBLEMS  *BOWEL PROBLEMS  UNUSUAL RASH Items with * indicate a potential emergency and should be followed up as soon as possible. If you have an emergency after office hours please contact your primary care physician or go to the nearest emergency department.  Please call the clinic during office hours if you have any questions or concerns.   You may also contact the Patient Navigator at [phone] should you have any questions or need assistance in obtaining follow up care.      Resources For Cancer Patients and their Caregivers ? American Cancer Society: Can assist with transportation, wigs, general needs, runs Look Good Feel Better.        1-888-227-6333 ? Cancer Care: Provides financial assistance, online support groups, medication/co-pay assistance.  1-800-813-HOPE (4673) ? [facility] Assists [place] cancer  patients and their families through emotional , educational and financial support.  336-427-4357 ? [place] DSS Where to apply for food stamps, Medicaid and utility assistance. 336-342-1394 ? RCATS: Transportation to medical appointments. 336-347-2287 ? Social Security Administration: May apply for disability if have a Stage IV cancer. 336-342-7796 1-800-772-1213 ? [place] Aging, Disability and Transit Services: Assists with nutrition, care and transit needs. [phone]         

## 2017-04-20 NOTE — Progress Notes (Signed)
Ok to treat per MD  Treatment given per orders. Patient tolerated it well without problems. Vitals stable and discharged home from clinic ambulatory. Follow up as scheduled.

## 2017-04-23 NOTE — Progress Notes (Signed)
Diagnosis Malignant neoplasm of colon, unspecified part of colon ([place]) - Plan: CBC with Differential, Comprehensive metabolic panel, Lactate dehydrogenase  Staging Cancer Staging Malignant neoplasm of transverse colon [facility]) Staging form: Colon and Rectum, AJCC 8th Edition - Clinical: cT3, cN1a, cM1 - Signed by Twana First, MD on 03/09/2017  Assessment and Plan:  1.  84 y.o. Seen prior to cycle 5 of palliative  immunotherapy with nivolumab for diagnosis of recurrent colon cancer under the direction of Dr Lebron Conners.  He is here to discuss PET scan.  PET scan done 04/17/2017 again shows an omental nodule.  He just had imaging in November 2018.  Will repeat imaging in 3-4 months for interval follow-up.  He will return to clinic in 2 weeks for cycle 6 of therapy.  TSH WNL.    2.  IDA.  Patient has been treated with IV iron in the past.  Hemoglobin is 12.5.  We will continue to monitor counts as therapy proceeds.  3.  Neuropathy.  We will continue to monitor this as therapy proceeds.  Interval History:   84 y.o. On nivolumab for diagnosis of recurrent colon cancer under the direction of Dr Lebron Conners.    Current Status:  Pt is seen today for follow-up prior to C5 of Nivo.  He reports minor neuropathy.  He is here to discuss Pet scan.     Problem List Patient Active Problem List   Diagnosis Date Noted  . Iron deficiency anemia [D50.9] 09/07/2016  . S/P partial colectomy [Z90.49] 07/11/2016  . 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[place]) [C18.4]   . High cholesterol [E78.00] 04/22/2016  . H/O aortic valve replacement [Z95.2] 04/22/2016  . Diabetes ([place]) [E11.9] 04/22/2016  . Chronic pulmonary embolism ([place]) [I27.82] 04/22/2016  . Rectal bleeding [K62.5] 04/22/2016    Past Medical History Past Medical History:  Diagnosis Date  . Chronic pulmonary embolism ([place]) 19/7588   compliction of the valve replacement surgery  . COPD (chronic obstructive pulmonary disease) ([place])   . Diabetes ([place])  04/22/2016  . H/O aortic valve replacement 10/2008  . Hypercholesteremia   . Hypertension   . Pulmonary emboli ([place]) 10/2008  . Sleep apnea     Past Surgical History Past Surgical History:  Procedure Laterality Date  . AORTIC VALVE REPLACEMENT     2010  . CARDIAC CATHETERIZATION  2010  . COLONOSCOPY N/A 06/05/2016   Procedure: COLONOSCOPY;  Surgeon: Rogene Houston, MD;  Location: [facility] ENDO SUITE;  Service: Endoscopy;  Laterality: N/A;  . PARTIAL COLECTOMY N/A 07/11/2016   Procedure: PARTIAL COLECTOMY;  Surgeon: Aviva Signs, MD;  Location: [facility] ORS;  Service: General;  Laterality: N/A;  . PORTACATH PLACEMENT N/A 08/13/2016   Procedure: INSERTION PORT-A-CATH LEFT SUBCLAVIAN;  Surgeon: Aviva Signs, MD;  Location: [facility] ORS;  Service: General;  Laterality: N/A;  . REPLACEMENT TOTAL KNEE     1996  . spenectomy     from trauma    Family History Family History  Problem Relation Age of Onset  . Colon cancer Neg Hx      Social History  reports that he quit smoking about 55 years ago. His smoking use included cigarettes. He quit after 15.00 years of use. he has never used smokeless tobacco. He reports that he does not drink alcohol or use drugs.  Medications  Current Outpatient Medications:  .  aspirin EC 81 MG tablet, Take 81 mg by mouth daily., Disp: , Rfl:  .  atorvastatin (LIPITOR) 10 MG tablet, Take 10 mg  by mouth at bedtime. , Disp: , Rfl:  .  Cholecalciferol (VITAMIN D3) 5000 units CAPS, Take 5,000 Units by mouth daily. , Disp: , Rfl:  .  Diphenhyd-Hydrocort-Nystatin (FIRST-DUKES MOUTHWASH) SUSP, Use as directed 5 mLs in the mouth or throat 4 (four) times daily as needed., Disp: 237 mL, Rfl: 3 .  diphenoxylate-atropine (LOMOTIL) 2.5-0.025 MG tablet, Take 1 tablet by mouth 4 (four) times daily as needed for diarrhea or loose stools., Disp: 60 tablet, Rfl: 0 .  finasteride (PROSCAR) 5 MG tablet, Take 5 mg by mouth at bedtime. , Disp: , Rfl:  .  gabapentin (NEURONTIN) 300 MG capsule,  Take 1 capsule (300 mg total) by mouth 3 (three) times daily., Disp: 90 capsule, Rfl: 2 .  lisinopril (PRINIVIL,ZESTRIL) 10 MG tablet, , Disp: , Rfl:  .  lisinopril (PRINIVIL,ZESTRIL) 5 MG tablet, Take 5 mg by mouth at bedtime. , Disp: , Rfl:  .  metFORMIN (GLUCOPHAGE) 1000 MG tablet, , Disp: , Rfl:  .  metFORMIN (GLUCOPHAGE) 500 MG tablet, Take 500 mg by mouth 2 (two) times daily., Disp: , Rfl:  .  metoprolol (LOPRESSOR) 50 MG tablet, Take 50 mg by mouth 2 (two) times daily., Disp: , Rfl:  .  Nivolumab (OPDIVO IV), Inject into the vein. Every 2 weeks, Disp: , Rfl:  .  vitamin B-12 (CYANOCOBALAMIN) 1000 MCG tablet, Take 1,000 mcg by mouth daily. , Disp: , Rfl:   Allergies Amoxicillin and Tamsulosin hcl  Review of Systems Review of Systems - Oncology ROS as per HPI otherwise 12 point ROS is negative for mild neuropathy   Physical Exam  Vitals Wt Readings from Last 3 Encounters:  04/20/17 199 lb 9.6 oz (90.5 kg)  04/20/17 199 lb 9.6 oz (90.5 kg)  04/06/17 202 lb 6.4 oz (91.8 kg)   Temp Readings from Last 3 Encounters:  04/20/17 97.9 F (36.6 C) (Oral)  04/20/17 97.9 F (36.6 C) (Oral)  04/06/17 97.8 F (36.6 C)   BP Readings from Last 3 Encounters:  04/20/17 (!) 112/59  04/20/17 (!) 112/59  04/06/17 (!) 105/59   Pulse Readings from Last 3 Encounters:  04/20/17 69  04/20/17 69  04/06/17 69   Constitutional: Well-developed, well-nourished, and in no distress.   HENT:  Head: Normocephalic and atraumatic.  Mouth/Throat: No oropharyngeal exudate. Mucosa moist. Eyes: Pupils are equal, round, and reactive to light. Conjunctivae are normal. No scleral icterus.  Neck: Normal range of motion. Neck supple. No JVD present.  Cardiovascular: Normal rate, regular rhythm and normal heart sounds.  Exam reveals no gallop and no friction rub.   No murmur heard. Pulmonary/Chest: Effort normal and breath sounds normal. No respiratory distress. No wheezes.No rales.  Abdominal: Soft. Bowel  sounds are normal. No distension. There is no tenderness. There is no guarding.  Musculoskeletal: No edema or tenderness.  Lymphadenopathy: No cervical or supraclavicular adenopathy.  Neurological: Alert and oriented to person, place, and time. No cranial nerve deficit.  Skin: Skin is warm and dry. No rash noted. No erythema. No pallor.  Psychiatric: Affect and judgment normal.   Labs Infusion on 04/20/2017  Component Date Value Ref Range Status  . TSH 04/20/2017 1.883  0.350 - 4.500 uIU/mL Final   Performed by a 3rd Generation assay with a functional sensitivity of <=0.01 uIU/mL.  [place] Kitchen Sodium 04/20/2017 136  135 - 145 mmol/L Final  . Potassium 04/20/2017 4.1  3.5 - 5.1 mmol/L Final  . Chloride 04/20/2017 99* 101 - 111 mmol/L Final  . CO2  04/20/2017 26  22 - 32 mmol/L Final  . Glucose, Bld 04/20/2017 118* 65 - 99 mg/dL Final  . BUN 04/20/2017 13  6 - 20 mg/dL Final  . Creatinine, Ser 04/20/2017 0.78  0.61 - 1.24 mg/dL Final  . Calcium 04/20/2017 9.5  8.9 - 10.3 mg/dL Final  . Total Protein 04/20/2017 7.2  6.5 - 8.1 g/dL Final  . Albumin 04/20/2017 3.9  3.5 - 5.0 g/dL Final  . AST 04/20/2017 28  15 - 41 U/L Final  . ALT 04/20/2017 26  17 - 63 U/L Final  . Alkaline Phosphatase 04/20/2017 43  38 - 126 U/L Final  . Total Bilirubin 04/20/2017 0.8  0.3 - 1.2 mg/dL Final  . GFR calc non Af Amer 04/20/2017 >60  >60 mL/min Final  . GFR calc Af Amer 04/20/2017 >60  >60 mL/min Final   Comment: (NOTE) The eGFR has been calculated using the CKD EPI equation. This calculation has not been validated in all clinical situations. eGFR's persistently <60 mL/min signify possible Chronic Kidney Disease.   . Anion gap 04/20/2017 11  5 - 15 Final  . WBC 04/20/2017 9.9  4.0 - 10.5 K/uL Final  . RBC 04/20/2017 3.82* 4.22 - 5.81 MIL/uL Final  . Hemoglobin 04/20/2017 12.5* 13.0 - 17.0 g/dL Final  . HCT 04/20/2017 38.7* 39.0 - 52.0 % Final  . MCV 04/20/2017 101.3* 78.0 - 100.0 fL Final  . MCH 04/20/2017  32.7  26.0 - 34.0 pg Final  . MCHC 04/20/2017 32.3  30.0 - 36.0 g/dL Final  . RDW 04/20/2017 12.4  11.5 - 15.5 % Final  . Platelets 04/20/2017 222  150 - 400 K/uL Final  . Neutrophils Relative % 04/20/2017 52  % Final  . Neutro Abs 04/20/2017 5.2  1.7 - 7.7 K/uL Final  . Lymphocytes Relative 04/20/2017 33  % Final  . Lymphs Abs 04/20/2017 3.3  0.7 - 4.0 K/uL Final  . Monocytes Relative 04/20/2017 10  % Final  . Monocytes Absolute 04/20/2017 1.0  0.1 - 1.0 K/uL Final  . Eosinophils Relative 04/20/2017 5  % Final  . Eosinophils Absolute 04/20/2017 0.5  0.0 - 0.7 K/uL Final  . Basophils Relative 04/20/2017 0  % Final  . Basophils Absolute 04/20/2017 0.0  0.0 - 0.1 K/uL Final     Pathology Orders Placed This Encounter  Procedures  . CBC with Differential    Standing Status:   Future    Standing Expiration Date:   04/20/2018  . Comprehensive metabolic panel    Standing Status:   Future    Standing Expiration Date:   04/20/2018  . Lactate dehydrogenase    Standing Status:   Future    Standing Expiration Date:   04/20/2018   Pet scan done 04/17/2016:  IMPRESSION: 1. Hypermetabolic left omental nodule is increased in size and slightly decreased in metabolism. 2. No new sites of hypermetabolic metastatic disease. 3. Low-level hypermetabolism within bilateral hilar lymph nodes is decreased bilaterally and previously described low level subcarinal nodal hypermetabolism has resolved, favor improving reactive adenopathy. 4. Stable 4.7 cm ascending thoracic aortic aneurysm. Recommend semi-annual imaging followup by CTA or MRA and referral to cardiothoracic surgery if not already obtained. This recommendation follows 2010 ACCF/AHA/AATS/ACR/ASA/SCA/SCAI/SIR/STS/SVM Guidelines for the Diagnosis and Management of Patients With Thoracic Aortic Disease. Circulation. 2010; 121: J884-Z660. 5. 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 6. Chronic findings include: Aortic Atherosclerosis  (ICD10-I70.0). Three-vessel coronary atherosclerosis. Chronic left maxillary sinusitis. Moderate left colonic diverticulosis. Cholelithiasis. Moderate prostatomegaly.  Zoila Shutter MD

## 2017-05-04 ENCOUNTER — Inpatient Hospital Stay (HOSPITAL_COMMUNITY): Payer: Medicare Other | Attending: Hematology and Oncology

## 2017-05-04 ENCOUNTER — Encounter (HOSPITAL_COMMUNITY): Payer: Self-pay

## 2017-05-04 ENCOUNTER — Other Ambulatory Visit: Payer: Self-pay

## 2017-05-04 VITALS — BP 99/57 | HR 69 | Temp 97.5°F | Resp 18 | Wt 200.2 lb

## 2017-05-04 DIAGNOSIS — N400 Enlarged prostate without lower urinary tract symptoms: Secondary | ICD-10-CM | POA: Diagnosis not present

## 2017-05-04 DIAGNOSIS — K625 Hemorrhage of anus and rectum: Secondary | ICD-10-CM | POA: Insufficient documentation

## 2017-05-04 DIAGNOSIS — Z7982 Long term (current) use of aspirin: Secondary | ICD-10-CM | POA: Diagnosis not present

## 2017-05-04 DIAGNOSIS — I700 Atherosclerosis of aorta: Secondary | ICD-10-CM | POA: Insufficient documentation

## 2017-05-04 DIAGNOSIS — K8020 Calculus of gallbladder without cholecystitis without obstruction: Secondary | ICD-10-CM | POA: Insufficient documentation

## 2017-05-04 DIAGNOSIS — J320 Chronic maxillary sinusitis: Secondary | ICD-10-CM | POA: Diagnosis not present

## 2017-05-04 DIAGNOSIS — Z7984 Long term (current) use of oral hypoglycemic drugs: Secondary | ICD-10-CM | POA: Insufficient documentation

## 2017-05-04 DIAGNOSIS — Z79899 Other long term (current) drug therapy: Secondary | ICD-10-CM | POA: Diagnosis not present

## 2017-05-04 DIAGNOSIS — I10 Essential (primary) hypertension: Secondary | ICD-10-CM | POA: Diagnosis not present

## 2017-05-04 DIAGNOSIS — I2510 Atherosclerotic heart disease of native coronary artery without angina pectoris: Secondary | ICD-10-CM | POA: Insufficient documentation

## 2017-05-04 DIAGNOSIS — J449 Chronic obstructive pulmonary disease, unspecified: Secondary | ICD-10-CM | POA: Insufficient documentation

## 2017-05-04 DIAGNOSIS — G4730 Sleep apnea, unspecified: Secondary | ICD-10-CM | POA: Insufficient documentation

## 2017-05-04 DIAGNOSIS — E7800 Pure hypercholesterolemia, unspecified: Secondary | ICD-10-CM | POA: Diagnosis not present

## 2017-05-04 DIAGNOSIS — E119 Type 2 diabetes mellitus without complications: Secondary | ICD-10-CM | POA: Diagnosis not present

## 2017-05-04 DIAGNOSIS — Z5112 Encounter for antineoplastic immunotherapy: Secondary | ICD-10-CM | POA: Diagnosis present

## 2017-05-04 DIAGNOSIS — Z87891 Personal history of nicotine dependence: Secondary | ICD-10-CM | POA: Insufficient documentation

## 2017-05-04 DIAGNOSIS — Z86711 Personal history of pulmonary embolism: Secondary | ICD-10-CM | POA: Insufficient documentation

## 2017-05-04 DIAGNOSIS — K5730 Diverticulosis of large intestine without perforation or abscess without bleeding: Secondary | ICD-10-CM | POA: Diagnosis not present

## 2017-05-04 DIAGNOSIS — Z952 Presence of prosthetic heart valve: Secondary | ICD-10-CM | POA: Insufficient documentation

## 2017-05-04 DIAGNOSIS — I712 Thoracic aortic aneurysm, without rupture: Secondary | ICD-10-CM | POA: Insufficient documentation

## 2017-05-04 DIAGNOSIS — C184 Malignant neoplasm of transverse colon: Secondary | ICD-10-CM | POA: Diagnosis not present

## 2017-05-04 DIAGNOSIS — C189 Malignant neoplasm of colon, unspecified: Secondary | ICD-10-CM

## 2017-05-04 LAB — TSH: TSH: 2.03 u[IU]/mL (ref 0.350–4.500)

## 2017-05-04 LAB — COMPREHENSIVE METABOLIC PANEL
ALT: 29 U/L (ref 17–63)
AST: 31 U/L (ref 15–41)
Albumin: 3.8 g/dL (ref 3.5–5.0)
Alkaline Phosphatase: 46 U/L (ref 38–126)
Anion gap: 9 (ref 5–15)
BUN: 17 mg/dL (ref 6–20)
CO2: 26 mmol/L (ref 22–32)
Calcium: 9.4 mg/dL (ref 8.9–10.3)
Chloride: 101 mmol/L (ref 101–111)
Creatinine, Ser: 1.06 mg/dL (ref 0.61–1.24)
GFR calc Af Amer: >60 mL/min (ref >60)
GFR calc non Af Amer: >60 mL/min (ref >60)
Glucose, Bld: 118 mg/dL — ABNORMAL HIGH (ref 65–99)
Potassium: 4.3 mmol/L (ref 3.5–5.1)
Sodium: 136 mmol/L (ref 135–145)
Total Bilirubin: 0.7 mg/dL (ref 0.3–1.2)
Total Protein: 7.2 g/dL (ref 6.5–8.1)

## 2017-05-04 LAB — CBC WITH DIFFERENTIAL/PLATELET
Basophils Absolute: 0 10*3/uL (ref 0.0–0.1)
Basophils Relative: 0 %
Eosinophils Absolute: 0.5 10*3/uL (ref 0.0–0.7)
Eosinophils Relative: 5 %
HCT: 40 % (ref 39.0–52.0)
Hemoglobin: 12.8 g/dL — ABNORMAL LOW (ref 13.0–17.0)
Lymphocytes Relative: 32 %
Lymphs Abs: 3.3 10*3/uL (ref 0.7–4.0)
MCH: 32.1 pg (ref 26.0–34.0)
MCHC: 32 g/dL (ref 30.0–36.0)
MCV: 100.3 fL — ABNORMAL HIGH (ref 78.0–100.0)
Monocytes Absolute: 1.1 10*3/uL — ABNORMAL HIGH (ref 0.1–1.0)
Monocytes Relative: 11 %
Neutro Abs: 5.2 10*3/uL (ref 1.7–7.7)
Neutrophils Relative %: 52 %
Platelets: 202 10*3/uL (ref 150–400)
RBC: 3.99 MIL/uL — ABNORMAL LOW (ref 4.22–5.81)
RDW: 12.4 % (ref 11.5–15.5)
WBC: 10.1 10*3/uL (ref 4.0–10.5)

## 2017-05-04 LAB — LACTATE DEHYDROGENASE: LDH: 155 U/L (ref 98–192)

## 2017-05-04 MED ORDER — SODIUM CHLORIDE 0.9 % IV SOLN
240.0000 mg | Freq: Once | INTRAVENOUS | Status: AC
  Administered 2017-05-04: 240 mg via INTRAVENOUS
  Filled 2017-05-04: qty 24

## 2017-05-04 MED ORDER — SODIUM CHLORIDE 0.9% FLUSH
10.0000 mL | INTRAVENOUS | Status: DC | PRN
  Administered 2017-05-04: 10 mL
  Filled 2017-05-04: qty 10

## 2017-05-04 MED ORDER — SODIUM CHLORIDE 0.9 % IV SOLN
Freq: Once | INTRAVENOUS | Status: AC
  Administered 2017-05-04: 12:00:00 via INTRAVENOUS

## 2017-05-04 MED ORDER — HEPARIN SOD (PORK) LOCK FLUSH 100 UNIT/ML IV SOLN
500.0000 [IU] | Freq: Once | INTRAVENOUS | Status: AC | PRN
  Administered 2017-05-04: 500 [IU]

## 2017-05-04 NOTE — Progress Notes (Signed)
Patient presented for opdivo today. No new complaints. Labs done and sent to lab.  Treatment given per orders. Patient tolerated it well without problems. Vitals stable and discharged home from clinic ambulatory. Follow up as scheduled.

## 2017-05-04 NOTE — Patient Instructions (Signed)
[facility] Discharge Instructions for Patients Receiving Chemotherapy   Beginning January 23rd 2017 lab work for the Cancer Center will be done in the  Main lab at [facility] on 1st floor. If you have a lab appointment with the Cancer Center please come in thru the  Main Entrance and check in at the main information desk   Today you received the following chemotherapy agents   To help prevent nausea and vomiting after your treatment, we encourage you to take your nausea medication     If you develop nausea and vomiting, or diarrhea that is not controlled by your medication, call the clinic.  The clinic phone number is [phone]. Office hours are Monday-Friday 8:30am-5:00pm.  BELOW ARE SYMPTOMS THAT SHOULD BE REPORTED IMMEDIATELY:  *FEVER GREATER THAN 101.0 F  *CHILLS WITH OR WITHOUT FEVER  NAUSEA AND VOMITING THAT IS NOT CONTROLLED WITH YOUR NAUSEA MEDICATION  *UNUSUAL SHORTNESS OF BREATH  *UNUSUAL BRUISING OR BLEEDING  TENDERNESS IN MOUTH AND THROAT WITH OR WITHOUT PRESENCE OF ULCERS  *URINARY PROBLEMS  *BOWEL PROBLEMS  UNUSUAL RASH Items with * indicate a potential emergency and should be followed up as soon as possible. If you have an emergency after office hours please contact your primary care physician or go to the nearest emergency department.  Please call the clinic during office hours if you have any questions or concerns.   You may also contact the Patient Navigator at [phone] should you have any questions or need assistance in obtaining follow up care.      Resources For Cancer Patients and their Caregivers ? American Cancer Society: Can assist with transportation, wigs, general needs, runs Look Good Feel Better.        1-888-227-6333 ? Cancer Care: Provides financial assistance, online support groups, medication/co-pay assistance.  1-800-813-HOPE (4673) ? [facility] Assists [place] cancer  patients and their families through emotional , educational and financial support.  336-427-4357 ? [place] DSS Where to apply for food stamps, Medicaid and utility assistance. 336-342-1394 ? RCATS: Transportation to medical appointments. 336-347-2287 ? Social Security Administration: May apply for disability if have a Stage IV cancer. 336-342-7796 1-800-772-1213 ? [place] Aging, Disability and Transit Services: Assists with nutrition, care and transit needs. [phone]         

## 2017-05-05 ENCOUNTER — Other Ambulatory Visit (HOSPITAL_COMMUNITY): Payer: Self-pay | Admitting: Adult Health

## 2017-05-18 ENCOUNTER — Encounter (HOSPITAL_COMMUNITY): Payer: Self-pay | Admitting: Internal Medicine

## 2017-05-18 ENCOUNTER — Inpatient Hospital Stay (HOSPITAL_BASED_OUTPATIENT_CLINIC_OR_DEPARTMENT_OTHER): Payer: Medicare Other | Admitting: Internal Medicine

## 2017-05-18 ENCOUNTER — Encounter (HOSPITAL_COMMUNITY): Payer: Self-pay

## 2017-05-18 ENCOUNTER — Inpatient Hospital Stay (HOSPITAL_COMMUNITY): Payer: Medicare Other

## 2017-05-18 ENCOUNTER — Other Ambulatory Visit: Payer: Self-pay

## 2017-05-18 VITALS — BP 96/57 | HR 70 | Temp 97.7°F | Resp 18 | Wt 203.4 lb

## 2017-05-18 DIAGNOSIS — C184 Malignant neoplasm of transverse colon: Secondary | ICD-10-CM | POA: Diagnosis not present

## 2017-05-18 DIAGNOSIS — Z79899 Other long term (current) drug therapy: Secondary | ICD-10-CM | POA: Diagnosis not present

## 2017-05-18 DIAGNOSIS — Z5112 Encounter for antineoplastic immunotherapy: Secondary | ICD-10-CM | POA: Diagnosis not present

## 2017-05-18 DIAGNOSIS — Z95828 Presence of other vascular implants and grafts: Secondary | ICD-10-CM | POA: Insufficient documentation

## 2017-05-18 LAB — CBC WITH DIFFERENTIAL/PLATELET
Basophils Absolute: 0 10*3/uL (ref 0.0–0.1)
Basophils Relative: 0 %
Eosinophils Absolute: 0.7 10*3/uL (ref 0.0–0.7)
Eosinophils Relative: 6 %
HCT: 40.9 % (ref 39.0–52.0)
Hemoglobin: 12.8 g/dL — ABNORMAL LOW (ref 13.0–17.0)
Lymphocytes Relative: 31 %
Lymphs Abs: 3.3 10*3/uL (ref 0.7–4.0)
MCH: 31.7 pg (ref 26.0–34.0)
MCHC: 31.3 g/dL (ref 30.0–36.0)
MCV: 101.2 fL — ABNORMAL HIGH (ref 78.0–100.0)
Monocytes Absolute: 1.1 10*3/uL — ABNORMAL HIGH (ref 0.1–1.0)
Monocytes Relative: 10 %
Neutro Abs: 5.7 10*3/uL (ref 1.7–7.7)
Neutrophils Relative %: 53 %
Platelets: 219 10*3/uL (ref 150–400)
RBC: 4.04 MIL/uL — ABNORMAL LOW (ref 4.22–5.81)
RDW: 12.7 % (ref 11.5–15.5)
WBC: 10.8 10*3/uL — ABNORMAL HIGH (ref 4.0–10.5)

## 2017-05-18 LAB — COMPREHENSIVE METABOLIC PANEL
ALT: 38 U/L (ref 17–63)
AST: 39 U/L (ref 15–41)
Albumin: 3.8 g/dL (ref 3.5–5.0)
Alkaline Phosphatase: 48 U/L (ref 38–126)
Anion gap: 9 (ref 5–15)
BUN: 19 mg/dL (ref 6–20)
CO2: 26 mmol/L (ref 22–32)
Calcium: 8.9 mg/dL (ref 8.9–10.3)
Chloride: 103 mmol/L (ref 101–111)
Creatinine, Ser: 1 mg/dL (ref 0.61–1.24)
GFR calc Af Amer: >60 mL/min (ref >60)
GFR calc non Af Amer: >60 mL/min (ref >60)
Glucose, Bld: 96 mg/dL (ref 65–99)
Potassium: 5 mmol/L (ref 3.5–5.1)
Sodium: 138 mmol/L (ref 135–145)
Total Bilirubin: 0.8 mg/dL (ref 0.3–1.2)
Total Protein: 7.2 g/dL (ref 6.5–8.1)

## 2017-05-18 LAB — LACTATE DEHYDROGENASE: LDH: 149 U/L (ref 98–192)

## 2017-05-18 MED ORDER — SODIUM CHLORIDE 0.9 % IV SOLN
240.0000 mg | Freq: Once | INTRAVENOUS | Status: AC
  Administered 2017-05-18: 240 mg via INTRAVENOUS
  Filled 2017-05-18: qty 24

## 2017-05-18 MED ORDER — ALTEPLASE 2 MG IJ SOLR
INTRAMUSCULAR | Status: AC
  Filled 2017-05-18: qty 2

## 2017-05-18 MED ORDER — ALTEPLASE 2 MG IJ SOLR
2.0000 mg | Freq: Once | INTRAMUSCULAR | Status: AC | PRN
  Administered 2017-05-18: 2 mg

## 2017-05-18 MED ORDER — SODIUM CHLORIDE 0.9 % IV SOLN
Freq: Once | INTRAVENOUS | Status: AC
  Administered 2017-05-18: 13:00:00 via INTRAVENOUS

## 2017-05-18 MED ORDER — HEPARIN SOD (PORK) LOCK FLUSH 100 UNIT/ML IV SOLN
500.0000 [IU] | Freq: Once | INTRAVENOUS | Status: AC | PRN
  Administered 2017-05-18: 500 [IU]

## 2017-05-18 MED ORDER — STERILE WATER FOR INJECTION IJ SOLN
INTRAMUSCULAR | Status: AC
  Filled 2017-05-18: qty 10

## 2017-05-18 NOTE — Progress Notes (Signed)
1010 - Alteplase 2 mg given via port d/t no blood return on accessing.    1040 - Aspirated 10 ml blood w/o difficulty from port a cath 30 min post Alteplase administration.   Tolerated infusion w/o adverse reaction.  Alert, in no distress.  VSS.  Discharged ambulatory.

## 2017-05-18 NOTE — Patient Instructions (Addendum)
[place] Discharge Instructions  RECOMMENDATIONS MADE BY THE CONSULTANT AND ANY TEST RESULTS WILL BE SENT TO YOUR REFERRING PHYSICIAN.  You were seen today by Dr. Zoila Shutter You will get your scans in April Follow up with Dr. Delton Coombes in March    Thank you for choosing [facility] at [facility] to provide your oncology and hematology care.  To afford each patient quality time with our provider, please arrive at least 15 minutes before your scheduled appointment time.    If you have a lab appointment with the [facility] please come in thru the  Main Entrance and check in at the main information desk  You need to re-schedule your appointment should you arrive 10 or more minutes late.  We strive to give you quality time with our providers, and arriving late affects you and other patients whose appointments are after yours.  Also, if you no show three or more times for appointments you may be dismissed from the clinic at the providers discretion.     Again, thank you for choosing [facility].  Our hope is that these requests will decrease the amount of time that you wait before being seen by our physicians.       _____________________________________________________________  Should you have questions after your visit to [facility], please contact our office at [phone] between the hours of 8:30 a.m. and 4:30 p.m.  Voicemails left after 4:30 p.m. will not be returned until the following business day.  For prescription refill requests, have your pharmacy contact our office.       Resources For Cancer Patients and their Caregivers ? American Cancer Society: Can assist with transportation, wigs, general needs, runs Look Good Feel Better.        636-758-6818 ? Cancer Care: Provides financial assistance, online support groups, medication/co-pay assistance.  1-800-813-HOPE (847)595-6213) ? [facility] Assists [place] cancer patients and their families through emotional , educational and financial support.  212-489-2062 ? [place] DSS Where to apply for food stamps, Medicaid and utility assistance. (940) 489-7976 ? RCATS: Transportation to medical appointments. 707-800-5716 ? Social Security Administration: May apply for disability if have a Stage IV cancer. 440-501-4535 234-848-0123 ? [employer], Disability and Transit Services: Assists with nutrition, care and transit needs. [place] Support Programs: @10RELATIVEDAYS @ > Cancer Support Group  2nd Tuesday of the month 1pm-2pm, Journey Room  > Creative Journey  3rd Tuesday of the month 1130am-1pm, Journey Room  > Look Good Feel Better  1st Wednesday of the month 10am-12 noon, Journey Room (Call [place] to register [phone])

## 2017-05-28 NOTE — Progress Notes (Signed)
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[place]) - Plan: NM PET Image Restag (PS) Skull Base To Thigh, CBC with Differential/Platelet, Comprehensive metabolic panel, Lactate dehydrogenase, CEA, DISCONTINUED: heparin lock flush 100 unit/mL, DISCONTINUED: 0.9 %  sodium chloride infusion, DISCONTINUED: nivolumab (OPDIVO) 240 mg in sodium chloride 0.9 % 100 mL chemo infusion  Staging Cancer Staging Malignant neoplasm of transverse colon [facility]) Staging form: Colon and Rectum, AJCC 8th Edition - Clinical: cT3, cN1a, cM1 - Signed by Twana First, MD on 03/09/2017 - Pathologic: No stage assigned - Unsigned   Assessment and Plan:  1. Recurrent CRC.   84 y.o. seen prior to cycle 7 of palliative  immunotherapy with nivolumab for diagnosis of recurrent colon cancer under the direction of Dr Lebron Conners.  PET scan done 04/17/2017 shows IMPRESSION: 1. Hypermetabolic left omental nodule is increased in size and slightly decreased in metabolism. 2. No new sites of hypermetabolic metastatic disease. 3. Low-level hypermetabolism within bilateral hilar lymph nodes is decreased bilaterally and previously described low level subcarinal nodal hypermetabolism has resolved, favor improving reactive adenopathy. 4. Stable 4.7 cm ascending thoracic aortic aneurysm. Recommend semi-annual imaging followup by CTA or MRA and referral to cardiothoracic surgery if not already obtained. This recommendation follows 2010 ACCF/AHA/AATS/ACR/ASA/SCA/SCAI/SIR/STS/SVM Guidelines for the Diagnosis and Management of Patients With Thoracic Aortic Disease. Circulation. 2010; 121: Y650-P546. 5. 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 6. Chronic findings include: Aortic Atherosclerosis (ICD10-I70.0). Three-vessel coronary atherosclerosis. Chronic left maxillary sinusitis. Moderate left colonic diverticulosis. Cholelithiasis. Moderate prostatomegaly  an omental nodule.    Pt will be set up for repeat imaging in 06/2017 and will  follow- to go over the results. He will Proceed with Cycle 7 of Nivo.   Labs adequate for chemotherapy.  Recent TSH 2 in 04/2017.    2.  IDA.  Patient has been treated with IV iron in the past.  Hemoglobin is 12.8.  Will continue to monitor counts as therapy proceeds.  3.  Neuropathy.  We will continue to monitor this as therapy proceeds.  4.  Hypertension.  BP is 96/57.  Continue to follow-up with PCP for ongoing monitoring.    Interval History:   84 y.o. On nivolumab for diagnosis of recurrent colon cancer under the direction of Dr Lebron Conners.    Current Status:  Pt is seen today for follow-up prior to C7 of Nivo.  He reports minor neuropathy.   Problem List Patient Active Problem List   Diagnosis Date Noted  . Port-A-Cath in place [facility] 05/18/2017  . Iron deficiency anemia [D50.9] 09/07/2016  . S/P partial colectomy [Z90.49] 07/11/2016  . 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[place]) [C18.4]   . High cholesterol [E78.00] 04/22/2016  . H/O aortic valve replacement [Z95.2] 04/22/2016  . Diabetes ([place]) [E11.9] 04/22/2016  . Chronic pulmonary embolism ([place]) [I27.82] 04/22/2016  . Rectal bleeding [K62.5] 04/22/2016    Past Medical History Past Medical History:  Diagnosis Date  . Chronic pulmonary embolism ([place]) 56/8127   compliction of the valve replacement surgery  . COPD (chronic obstructive pulmonary disease) ([place])   . Diabetes ([place]) 04/22/2016  . H/O aortic valve replacement 10/2008  . Hypercholesteremia   . Hypertension   . Pulmonary emboli ([place]) 10/2008  . Sleep apnea     Past Surgical History Past Surgical History:  Procedure Laterality Date  . AORTIC VALVE REPLACEMENT     2010  . CARDIAC CATHETERIZATION  2010  . COLONOSCOPY N/A 06/05/2016   Procedure: COLONOSCOPY;  Surgeon: Rogene Houston, MD;  Location: [facility] ENDO  SUITE;  Service: Endoscopy;  Laterality: N/A;  . PARTIAL COLECTOMY N/A 07/11/2016   Procedure: PARTIAL COLECTOMY;  Surgeon: Aviva Signs, MD;  Location: [facility]  ORS;  Service: General;  Laterality: N/A;  . PORTACATH PLACEMENT N/A 08/13/2016   Procedure: INSERTION PORT-A-CATH LEFT SUBCLAVIAN;  Surgeon: Aviva Signs, MD;  Location: [facility] ORS;  Service: General;  Laterality: N/A;  . REPLACEMENT TOTAL KNEE     1996  . spenectomy     from trauma    Family History Family History  Problem Relation Age of Onset  . Colon cancer Neg Hx      Social History  reports that he quit smoking about 55 years ago. His smoking use included cigarettes. He quit after 15.00 years of use. he has never used smokeless tobacco. He reports that he does not drink alcohol or use drugs.  Medications  Current Outpatient Medications:  .  aspirin EC 81 MG tablet, Take 81 mg by mouth daily., Disp: , Rfl:  .  atorvastatin (LIPITOR) 10 MG tablet, Take 10 mg by mouth at bedtime. , Disp: , Rfl:  .  Cholecalciferol (VITAMIN D3) 5000 units CAPS, Take 5,000 Units by mouth daily. , Disp: , Rfl:  .  Diphenhyd-Hydrocort-Nystatin (FIRST-DUKES MOUTHWASH) SUSP, Use as directed 5 mLs in the mouth or throat 4 (four) times daily as needed., Disp: 237 mL, Rfl: 3 .  diphenoxylate-atropine (LOMOTIL) 2.5-0.025 MG tablet, Take 1 tablet by mouth 4 (four) times daily as needed for diarrhea or loose stools., Disp: 60 tablet, Rfl: 0 .  finasteride (PROSCAR) 5 MG tablet, Take 5 mg by mouth at bedtime. , Disp: , Rfl:  .  gabapentin (NEURONTIN) 300 MG capsule, Take 1 capsule (300 mg total) by mouth 3 (three) times daily., Disp: 90 capsule, Rfl: 2 .  lisinopril (PRINIVIL,ZESTRIL) 10 MG tablet, , Disp: , Rfl:  .  lisinopril (PRINIVIL,ZESTRIL) 5 MG tablet, Take 5 mg by mouth at bedtime. , Disp: , Rfl:  .  metFORMIN (GLUCOPHAGE) 500 MG tablet, Take 500 mg by mouth 2 (two) times daily., Disp: , Rfl:  .  metoprolol (LOPRESSOR) 50 MG tablet, Take 50 mg by mouth 2 (two) times daily., Disp: , Rfl:  .  Nivolumab (OPDIVO IV), Inject into the vein. Every 2 weeks, Disp: , Rfl:  .  vitamin B-12 (CYANOCOBALAMIN) 1000 MCG  tablet, Take 1,000 mcg by mouth daily. , Disp: , Rfl:   Allergies Amoxicillin and Tamsulosin hcl  Review of Systems Review of Systems - Oncology ROS as per HPI otherwise 12 point ROS is negative other than neuropathy.     Physical Exam  Vitals Wt Readings from Last 3 Encounters:  05/18/17 203 lb 6.4 oz (92.3 kg)  05/04/17 200 lb 3.2 oz (90.8 kg)  04/20/17 199 lb 9.6 oz (90.5 kg)   Temp Readings from Last 3 Encounters:  05/18/17 97.7 F (36.5 C) (Oral)  05/04/17 (!) 97.5 F (36.4 C) (Oral)  04/20/17 97.9 F (36.6 C) (Oral)   BP Readings from Last 3 Encounters:  05/18/17 (!) 96/57  05/04/17 (!) 99/57  04/20/17 (!) 112/59   Pulse Readings from Last 3 Encounters:  05/18/17 70  05/04/17 69  04/20/17 69   Constitutional: Well-developed, well-nourished, and in no distress.   HENT: Head: Normocephalic and atraumatic.  Mouth/Throat: No oropharyngeal exudate. Mucosa moist. Eyes: Pupils are equal, round, and reactive to light. Conjunctivae are normal. No scleral icterus.  Neck: Normal range of motion. Neck supple. No JVD present.  Cardiovascular: Normal rate, regular  rhythm and normal heart sounds.  Exam reveals no gallop and no friction rub.   No murmur heard. Pulmonary/Chest: Effort normal and breath sounds normal. No respiratory distress. No wheezes.No rales.  Abdominal: Soft. Bowel sounds are normal. No distension. There is no tenderness. There is no guarding.  Musculoskeletal: No edema or tenderness.  Lymphadenopathy: No cervical, axillary or supraclavicular adenopathy.  Neurological: Alert and oriented to person, place, and time. No cranial nerve deficit.  Skin: Skin is warm and dry. No rash noted. No erythema. No pallor.  Psychiatric: Affect and judgment normal.   Labs Infusion on 05/18/2017  Component Date Value Ref Range Status  . LDH 05/18/2017 149  98 - 192 U/L Final   Performed at [facility], [address], [place], [place] [address]  . Sodium 05/18/2017 138   135 - 145 mmol/L Final  . Potassium 05/18/2017 5.0  3.5 - 5.1 mmol/L Final  . Chloride 05/18/2017 103  101 - 111 mmol/L Final  . CO2 05/18/2017 26  22 - 32 mmol/L Final  . Glucose, Bld 05/18/2017 96  65 - 99 mg/dL Final  . BUN 05/18/2017 19  6 - 20 mg/dL Final  . Creatinine, Ser 05/18/2017 1.00  0.61 - 1.24 mg/dL Final  . Calcium 05/18/2017 8.9  8.9 - 10.3 mg/dL Final  . Total Protein 05/18/2017 7.2  6.5 - 8.1 g/dL Final  . Albumin 05/18/2017 3.8  3.5 - 5.0 g/dL Final  . AST 05/18/2017 39  15 - 41 U/L Final  . ALT 05/18/2017 38  17 - 63 U/L Final  . Alkaline Phosphatase 05/18/2017 48  38 - 126 U/L Final  . Total Bilirubin 05/18/2017 0.8  0.3 - 1.2 mg/dL Final  . GFR calc non Af Amer 05/18/2017 >60  >60 mL/min Final  . GFR calc Af Amer 05/18/2017 >60  >60 mL/min Final   Comment: (NOTE) The eGFR has been calculated using the CKD EPI equation. This calculation has not been validated in all clinical situations. eGFR's persistently <60 mL/min signify possible Chronic Kidney Disease.   Georgiann Hahn gap 05/18/2017 9  5 - 15 Final   Performed at [facility], [address] [place], [place] [address]  . WBC 05/18/2017 10.8* 4.0 - 10.5 K/uL Final  . RBC 05/18/2017 4.04* 4.22 - 5.81 MIL/uL Final  . Hemoglobin 05/18/2017 12.8* 13.0 - 17.0 g/dL Final  . HCT 05/18/2017 40.9  39.0 - 52.0 % Final  . MCV 05/18/2017 101.2* 78.0 - 100.0 fL Final  . MCH 05/18/2017 31.7  26.0 - 34.0 pg Final  . MCHC 05/18/2017 31.3  30.0 - 36.0 g/dL Final  . RDW 05/18/2017 12.7  11.5 - 15.5 % Final  . Platelets 05/18/2017 219  150 - 400 K/uL Final  . Neutrophils Relative % 05/18/2017 53  % Final  . Neutro Abs 05/18/2017 5.7  1.7 - 7.7 K/uL Final  . Lymphocytes Relative 05/18/2017 31  % Final  . Lymphs Abs 05/18/2017 3.3  0.7 - 4.0 K/uL Final  . Monocytes Relative 05/18/2017 10  % Final  . Monocytes Absolute 05/18/2017 1.1* 0.1 - 1.0 K/uL Final  . Eosinophils Relative 05/18/2017 6  % Final  . Eosinophils Absolute  05/18/2017 0.7  0.0 - 0.7 K/uL Final  . Basophils Relative 05/18/2017 0  % Final  . Basophils Absolute 05/18/2017 0.0  0.0 - 0.1 K/uL Final   Performed at [facility], [address], [place], [place] [address]     Pathology Orders Placed This Encounter  Procedures  . NM PET Image Restag (PS) Skull Base To Thigh    Standing Status:   Future    Standing Expiration Date:   05/18/2018    Order Specific Question:   If indicated for the ordered procedure, I authorize the administration of a radiopharmaceutical per Radiology protocol    Answer:   Yes    Order Specific Question:   Preferred imaging location?    Answer:   [facility]    Order Specific Question:   Radiology Contrast Protocol - do NOT remove file path    Answer:   [email or web address]  . CBC with Differential/Platelet    Standing Status:   Future    Standing Expiration Date:   05/18/2018  . Comprehensive metabolic panel    Standing Status:   Future    Standing Expiration Date:   05/18/2018  . Lactate dehydrogenase    Standing Status:   Future    Standing Expiration Date:   05/18/2018  . CEA    Standing Status:   Future    Standing Expiration Date:   05/18/2018       Zoila Shutter MD

## 2017-06-01 ENCOUNTER — Other Ambulatory Visit: Payer: Self-pay

## 2017-06-01 ENCOUNTER — Inpatient Hospital Stay (HOSPITAL_COMMUNITY): Payer: Medicare Other | Attending: Hematology and Oncology

## 2017-06-01 ENCOUNTER — Encounter (HOSPITAL_COMMUNITY): Payer: Self-pay

## 2017-06-01 VITALS — BP 126/62 | HR 66 | Temp 97.5°F | Resp 18 | Wt 203.6 lb

## 2017-06-01 DIAGNOSIS — Z7984 Long term (current) use of oral hypoglycemic drugs: Secondary | ICD-10-CM | POA: Diagnosis not present

## 2017-06-01 DIAGNOSIS — C184 Malignant neoplasm of transverse colon: Secondary | ICD-10-CM | POA: Insufficient documentation

## 2017-06-01 DIAGNOSIS — I10 Essential (primary) hypertension: Secondary | ICD-10-CM | POA: Diagnosis not present

## 2017-06-01 DIAGNOSIS — G629 Polyneuropathy, unspecified: Secondary | ICD-10-CM | POA: Diagnosis not present

## 2017-06-01 DIAGNOSIS — Z87891 Personal history of nicotine dependence: Secondary | ICD-10-CM | POA: Diagnosis not present

## 2017-06-01 DIAGNOSIS — Z952 Presence of prosthetic heart valve: Secondary | ICD-10-CM | POA: Diagnosis not present

## 2017-06-01 DIAGNOSIS — E119 Type 2 diabetes mellitus without complications: Secondary | ICD-10-CM | POA: Diagnosis not present

## 2017-06-01 DIAGNOSIS — J449 Chronic obstructive pulmonary disease, unspecified: Secondary | ICD-10-CM | POA: Insufficient documentation

## 2017-06-01 DIAGNOSIS — G4730 Sleep apnea, unspecified: Secondary | ICD-10-CM | POA: Diagnosis not present

## 2017-06-01 DIAGNOSIS — Z7982 Long term (current) use of aspirin: Secondary | ICD-10-CM | POA: Insufficient documentation

## 2017-06-01 DIAGNOSIS — E7800 Pure hypercholesterolemia, unspecified: Secondary | ICD-10-CM | POA: Insufficient documentation

## 2017-06-01 DIAGNOSIS — Z86711 Personal history of pulmonary embolism: Secondary | ICD-10-CM | POA: Diagnosis not present

## 2017-06-01 DIAGNOSIS — Z79899 Other long term (current) drug therapy: Secondary | ICD-10-CM | POA: Diagnosis not present

## 2017-06-01 DIAGNOSIS — Z5112 Encounter for antineoplastic immunotherapy: Secondary | ICD-10-CM | POA: Diagnosis not present

## 2017-06-01 LAB — CBC WITH DIFFERENTIAL/PLATELET
Basophils Absolute: 0 10*3/uL (ref 0.0–0.1)
Basophils Relative: 0 %
Eosinophils Absolute: 0.5 10*3/uL (ref 0.0–0.7)
Eosinophils Relative: 7 %
HCT: 37.7 % — ABNORMAL LOW (ref 39.0–52.0)
Hemoglobin: 12.1 g/dL — ABNORMAL LOW (ref 13.0–17.0)
Lymphocytes Relative: 40 %
Lymphs Abs: 3.3 10*3/uL (ref 0.7–4.0)
MCH: 32.1 pg (ref 26.0–34.0)
MCHC: 32.1 g/dL (ref 30.0–36.0)
MCV: 100 fL (ref 78.0–100.0)
Monocytes Absolute: 1.1 10*3/uL — ABNORMAL HIGH (ref 0.1–1.0)
Monocytes Relative: 13 %
Neutro Abs: 3.2 10*3/uL (ref 1.7–7.7)
Neutrophils Relative %: 40 %
Platelets: 227 10*3/uL (ref 150–400)
RBC: 3.77 MIL/uL — ABNORMAL LOW (ref 4.22–5.81)
RDW: 12.9 % (ref 11.5–15.5)
WBC: 8.1 10*3/uL (ref 4.0–10.5)

## 2017-06-01 LAB — LACTATE DEHYDROGENASE: LDH: 137 U/L (ref 98–192)

## 2017-06-01 LAB — COMPREHENSIVE METABOLIC PANEL
ALT: 32 U/L (ref 17–63)
AST: 27 U/L (ref 15–41)
Albumin: 3.9 g/dL (ref 3.5–5.0)
Alkaline Phosphatase: 50 U/L (ref 38–126)
Anion gap: 9 (ref 5–15)
BUN: 19 mg/dL (ref 6–20)
CO2: 27 mmol/L (ref 22–32)
Calcium: 9.3 mg/dL (ref 8.9–10.3)
Chloride: 103 mmol/L (ref 101–111)
Creatinine, Ser: 0.93 mg/dL (ref 0.61–1.24)
GFR calc Af Amer: >60 mL/min (ref >60)
GFR calc non Af Amer: >60 mL/min (ref >60)
Glucose, Bld: 96 mg/dL (ref 65–99)
Potassium: 4.3 mmol/L (ref 3.5–5.1)
Sodium: 139 mmol/L (ref 135–145)
Total Bilirubin: 0.9 mg/dL (ref 0.3–1.2)
Total Protein: 7.1 g/dL (ref 6.5–8.1)

## 2017-06-01 MED ORDER — SODIUM CHLORIDE 0.9 % IV SOLN
240.0000 mg | Freq: Once | INTRAVENOUS | Status: AC
  Administered 2017-06-01: 240 mg via INTRAVENOUS
  Filled 2017-06-01: qty 24

## 2017-06-01 MED ORDER — SODIUM CHLORIDE 0.9 % IV SOLN
Freq: Once | INTRAVENOUS | Status: AC
  Administered 2017-06-01: 11:00:00 via INTRAVENOUS

## 2017-06-01 MED ORDER — HEPARIN SOD (PORK) LOCK FLUSH 100 UNIT/ML IV SOLN
500.0000 [IU] | Freq: Once | INTRAVENOUS | Status: AC | PRN
  Administered 2017-06-01: 500 [IU]
  Filled 2017-06-01: qty 5

## 2017-06-01 NOTE — Progress Notes (Signed)
Tolerated infusion w/o adverse reaction.  Alert, in no distress.  VSS.  Discharged ambulatory in c/o friend.  

## 2017-06-15 ENCOUNTER — Encounter (HOSPITAL_COMMUNITY): Payer: Self-pay | Admitting: Hematology

## 2017-06-15 ENCOUNTER — Other Ambulatory Visit: Payer: Self-pay

## 2017-06-15 ENCOUNTER — Encounter (HOSPITAL_COMMUNITY): Payer: Self-pay

## 2017-06-15 ENCOUNTER — Inpatient Hospital Stay (HOSPITAL_BASED_OUTPATIENT_CLINIC_OR_DEPARTMENT_OTHER): Payer: Medicare Other | Admitting: Hematology

## 2017-06-15 ENCOUNTER — Inpatient Hospital Stay (HOSPITAL_COMMUNITY): Payer: Medicare Other

## 2017-06-15 VITALS — BP 109/57 | HR 59 | Temp 97.6°F | Resp 18

## 2017-06-15 DIAGNOSIS — Z5112 Encounter for antineoplastic immunotherapy: Secondary | ICD-10-CM | POA: Diagnosis not present

## 2017-06-15 DIAGNOSIS — G629 Polyneuropathy, unspecified: Secondary | ICD-10-CM

## 2017-06-15 DIAGNOSIS — C184 Malignant neoplasm of transverse colon: Secondary | ICD-10-CM

## 2017-06-15 DIAGNOSIS — Z79899 Other long term (current) drug therapy: Secondary | ICD-10-CM

## 2017-06-15 LAB — COMPREHENSIVE METABOLIC PANEL
ALT: 26 U/L (ref 17–63)
AST: 29 U/L (ref 15–41)
Albumin: 3.9 g/dL (ref 3.5–5.0)
Alkaline Phosphatase: 43 U/L (ref 38–126)
Anion gap: 10 (ref 5–15)
BUN: 13 mg/dL (ref 6–20)
CO2: 26 mmol/L (ref 22–32)
Calcium: 9.3 mg/dL (ref 8.9–10.3)
Chloride: 100 mmol/L — ABNORMAL LOW (ref 101–111)
Creatinine, Ser: 0.81 mg/dL (ref 0.61–1.24)
GFR calc Af Amer: >60 mL/min (ref >60)
GFR calc non Af Amer: >60 mL/min (ref >60)
Glucose, Bld: 128 mg/dL — ABNORMAL HIGH (ref 65–99)
Potassium: 4.8 mmol/L (ref 3.5–5.1)
Sodium: 136 mmol/L (ref 135–145)
Total Bilirubin: 1 mg/dL (ref 0.3–1.2)
Total Protein: 7.3 g/dL (ref 6.5–8.1)

## 2017-06-15 LAB — CBC WITH DIFFERENTIAL/PLATELET
Basophils Absolute: 0 10*3/uL (ref 0.0–0.1)
Basophils Relative: 0 %
Eosinophils Absolute: 0.4 10*3/uL (ref 0.0–0.7)
Eosinophils Relative: 4 %
HCT: 38.7 % — ABNORMAL LOW (ref 39.0–52.0)
Hemoglobin: 12.4 g/dL — ABNORMAL LOW (ref 13.0–17.0)
Lymphocytes Relative: 28 %
Lymphs Abs: 2.7 10*3/uL (ref 0.7–4.0)
MCH: 32.2 pg (ref 26.0–34.0)
MCHC: 32 g/dL (ref 30.0–36.0)
MCV: 100.5 fL — ABNORMAL HIGH (ref 78.0–100.0)
Monocytes Absolute: 0.8 10*3/uL (ref 0.1–1.0)
Monocytes Relative: 9 %
Neutro Abs: 5.8 10*3/uL (ref 1.7–7.7)
Neutrophils Relative %: 59 %
Platelets: 233 10*3/uL (ref 150–400)
RBC: 3.85 MIL/uL — ABNORMAL LOW (ref 4.22–5.81)
RDW: 13 % (ref 11.5–15.5)
WBC: 9.8 10*3/uL (ref 4.0–10.5)

## 2017-06-15 LAB — LACTATE DEHYDROGENASE: LDH: 152 U/L (ref 98–192)

## 2017-06-15 MED ORDER — HEPARIN SOD (PORK) LOCK FLUSH 100 UNIT/ML IV SOLN
500.0000 [IU] | Freq: Once | INTRAVENOUS | Status: AC | PRN
  Administered 2017-06-15: 500 [IU]
  Filled 2017-06-15: qty 5

## 2017-06-15 MED ORDER — SODIUM CHLORIDE 0.9 % IV SOLN
240.0000 mg | Freq: Once | INTRAVENOUS | Status: AC
  Administered 2017-06-15: 240 mg via INTRAVENOUS
  Filled 2017-06-15: qty 24

## 2017-06-15 MED ORDER — SODIUM CHLORIDE 0.9% FLUSH
10.0000 mL | INTRAVENOUS | Status: DC | PRN
  Administered 2017-06-15: 10 mL
  Filled 2017-06-15: qty 10

## 2017-06-15 MED ORDER — SODIUM CHLORIDE 0.9 % IV SOLN
Freq: Once | INTRAVENOUS | Status: AC
  Administered 2017-06-15: 12:00:00 via INTRAVENOUS

## 2017-06-15 NOTE — Progress Notes (Signed)
[facility] [address], [place] [address]   CLINIC:  Medical Oncology/Hematology  PCP:  Tobe Sos, MD [address] [place] [address] [phone]   REASON FOR VISIT:  Follow-up for metastatic colon cancer.  CURRENT THERAPY: Opdivo every 2 weeks.  CANCER STAGING: Cancer Staging Malignant neoplasm of transverse colon [facility]) Staging form: Colon and Rectum, AJCC 8th Edition - Clinical: cT3, cN1a, cM1 - Signed by Twana First, MD on 03/09/2017 - Pathologic: No stage assigned - Unsigned    INTERVAL HISTORY:  Mr. Snavely 84 y.o. male returns for next cycle of immunotherapy.  He is tolerating it very well.  He denies any diarrhea, severe cough, skin rashes or itching.  He denies any new onset pains.  He continues to have tingling which is constant in the hands and feet, fingers more than feet.  He drops things.  He is currently taking gabapentin 300 mg 3 times a day.  He denies any fevers or infections.  No headaches were reported.   REVIEW OF SYSTEMS:  Review of Systems  Constitutional: Negative.   HENT:  Negative.   Respiratory: Negative.   Cardiovascular: Negative.   Gastrointestinal: Negative.   Genitourinary: Negative.    Musculoskeletal: Negative.   Skin: Negative.   Neurological: Positive for numbness.  Hematological: Negative.   Psychiatric/Behavioral: Negative.      PAST MEDICAL/SURGICAL HISTORY:  Past Medical History:  Diagnosis Date  . Chronic pulmonary embolism ([place]) 55/3748   compliction of the valve replacement surgery  . COPD (chronic obstructive pulmonary disease) ([place])   . Diabetes ([place]) 04/22/2016  . H/O aortic valve replacement 10/2008  . Hypercholesteremia   . Hypertension   . Pulmonary emboli ([place]) 10/2008  . Sleep apnea    Past Surgical History:  Procedure Laterality Date  . AORTIC VALVE REPLACEMENT     2010  . CARDIAC CATHETERIZATION  2010  . COLONOSCOPY N/A 06/05/2016   Procedure: COLONOSCOPY;  Surgeon:  Rogene Houston, MD;  Location: [facility] ENDO SUITE;  Service: Endoscopy;  Laterality: N/A;  . PARTIAL COLECTOMY N/A 07/11/2016   Procedure: PARTIAL COLECTOMY;  Surgeon: Aviva Signs, MD;  Location: [facility] ORS;  Service: General;  Laterality: N/A;  . PORTACATH PLACEMENT N/A 08/13/2016   Procedure: INSERTION PORT-A-CATH LEFT SUBCLAVIAN;  Surgeon: Aviva Signs, MD;  Location: [facility] ORS;  Service: General;  Laterality: N/A;  . REPLACEMENT TOTAL KNEE     1996  . spenectomy     from trauma     SOCIAL HISTORY:  Social History   Socioeconomic History  . Marital status: Widowed    Spouse name: Not on file  . Number of children: Not on file  . Years of education: Not on file  . Highest education level: Not on file  Occupational History  . Not on file  Social Needs  . Financial resource strain: Not on file  . Food insecurity:    Worry: Not on file    Inability: Not on file  . Transportation needs:    Medical: Not on file    Non-medical: Not on file  Tobacco Use  . Smoking status: Former Smoker    Years: 15.00    Types: Cigarettes    Last attempt to quit: 1964    Years since quitting: 55.2  . Smokeless tobacco: Never Used  Substance and Sexual Activity  . Alcohol use: No  . Drug use: No  . Sexual activity: Yes  Lifestyle  . Physical activity:  Days per week: Not on file    Minutes per session: Not on file  . Stress: Not on file  Relationships  . Social connections:    Talks on phone: Not on file    Gets together: Not on file    Attends religious service: Not on file    Active member of club or organization: Not on file    Attends meetings of clubs or organizations: Not on file    Relationship status: Not on file  . Intimate partner violence:    Fear of current or ex partner: Not on file    Emotionally abused: Not on file    Physically abused: Not on file    Forced sexual activity: Not on file  Other Topics Concern  . Not on file  Social History Narrative  . Not on file     FAMILY HISTORY:  Family History  Problem Relation Age of Onset  . Colon cancer Neg Hx     CURRENT MEDICATIONS:  Outpatient Encounter Medications as of 06/15/2017  Medication Sig Note  . aspirin EC 81 MG tablet Take 81 mg by mouth daily.   Marland Kitchen atorvastatin (LIPITOR) 10 MG tablet Take 10 mg by mouth at bedtime.    . Cholecalciferol (VITAMIN D3) 5000 units CAPS Take 5,000 Units by mouth daily.    . Diphenhyd-Hydrocort-Nystatin (FIRST-DUKES MOUTHWASH) SUSP Use as directed 5 mLs in the mouth or throat 4 (four) times daily as needed.   . diphenoxylate-atropine (LOMOTIL) 2.5-0.025 MG tablet Take 1 tablet by mouth 4 (four) times daily as needed for diarrhea or loose stools.   . finasteride (PROSCAR) 5 MG tablet Take 5 mg by mouth at bedtime.    . gabapentin (NEURONTIN) 300 MG capsule Take 1 capsule (300 mg total) by mouth 3 (three) times daily.   Marland Kitchen lisinopril (PRINIVIL,ZESTRIL) 10 MG tablet    . lisinopril (PRINIVIL,ZESTRIL) 5 MG tablet Take 5 mg by mouth at bedtime.    . metFORMIN (GLUCOPHAGE) 500 MG tablet Take 500 mg by mouth 2 (two) times daily.   . metoprolol (LOPRESSOR) 50 MG tablet Take 50 mg by mouth 2 (two) times daily.   . Nivolumab (OPDIVO IV) Inject into the vein. Every 2 weeks   . vitamin B-12 (CYANOCOBALAMIN) 1000 MCG tablet Take 1,000 mcg by mouth daily.    . [DISCONTINUED] prochlorperazine (COMPAZINE) 10 MG tablet Take 1 tablet (10 mg total) by mouth every 6 (six) hours as needed (Nausea or vomiting). 08/05/2016: New Med   No facility-administered encounter medications on file as of 06/15/2017.     ALLERGIES:  Allergies  Allergen Reactions  . Amoxicillin Hives    Has patient had a PCN reaction causing immediate rash, facial/tongue/throat swelling, SOB or lightheadedness with hypotension: No Has patient had a PCN reaction causing severe rash involving mucus membranes or skin necrosis: Yes Has patient had a PCN reaction that required hospitalization No Has patient had a PCN  reaction occurring within the last 10 years: No If all of the above answers are "NO", then may proceed with Cephalosporin use.   . Tamsulosin Hcl     Cant sleep, confusion      PHYSICAL EXAM:  ECOG Performance status: 1  Vitals:   06/15/17 0944  BP: 134/62  Pulse: 71  Resp: 20  Temp: 97.7 F (36.5 C)  SpO2: 100%   Filed Weights   06/15/17 0944  Weight: 203 lb (92.1 kg)    Physical Exam  Constitutional: He is well-developed, well-nourished,  and in no distress.  Cardiovascular: Normal rate, regular rhythm and normal heart sounds.  Pulmonary/Chest: Effort normal and breath sounds normal.  Psychiatric: Mood, memory, affect and judgment normal.     LABORATORY DATA:  I have reviewed the labs as listed.  CBC    Component Value Date/Time   WBC 9.8 06/15/2017 1033   RBC 3.85 (L) 06/15/2017 1033   HGB 12.4 (L) 06/15/2017 1033   HCT 38.7 (L) 06/15/2017 1033   PLT 233 06/15/2017 1033   MCV 100.5 (H) 06/15/2017 1033   MCH 32.2 06/15/2017 1033   MCHC 32.0 06/15/2017 1033   RDW 13.0 06/15/2017 1033   LYMPHSABS 2.7 06/15/2017 1033   MONOABS 0.8 06/15/2017 1033   EOSABS 0.4 06/15/2017 1033   BASOSABS 0.0 06/15/2017 1033   CMP Latest Ref Rng & Units 06/15/2017 06/01/2017 05/18/2017  Glucose 65 - 99 mg/dL 128(H) 96 96  BUN 6 - 20 mg/dL '13 19 19  ' Creatinine 0.61 - 1.24 mg/dL 0.81 0.93 1.00  Sodium 135 - 145 mmol/L 136 139 138  Potassium 3.5 - 5.1 mmol/L 4.8 4.3 5.0  Chloride 101 - 111 mmol/L 100(L) 103 103  CO2 22 - 32 mmol/L '26 27 26  ' Calcium 8.9 - 10.3 mg/dL 9.3 9.3 8.9  Total Protein 6.5 - 8.1 g/dL 7.3 7.1 7.2  Total Bilirubin 0.3 - 1.2 mg/dL 1.0 0.9 0.8  Alkaline Phos 38 - 126 U/L 43 50 48  AST 15 - 41 U/L 29 27 39  ALT 17 - 63 U/L 26 32 38        ASSESSMENT & PLAN:   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HCC) 1.  Stage IV colon cancer (pT3 pN1 M1) MSI -High: -K-ras mutation negative, BRAF V600E positive, PIK3CA, CDKN2A,TP53 -Tolerating  opdivo every 2  weeks very well.  PET/CT scan on 04/17/2017 shows stable disease.  He does not have any immunotherapy related side effects.  Last TSH in February was within normal limits.  Last CEA in December 2018 was also within normal limits.  2.  Peripheral neuropathy: He has tingling which is continuous, in the hands and feet.  Fingers is far worse than in the feet.  He drops things.  He is currently on Neurontin 300 mg 3 times a day.  It is not helping him completely.  I will increase it to 600 mg 3 times a day.  He reports he is not getting drowsy with it.  I plan to repeat scans, 3-4 months from the last one.     Derek Jack, MD [place] [phone]

## 2017-06-15 NOTE — Assessment & Plan Note (Signed)
1.  Stage IV colon cancer (pT3 pN1 M1) MSI -High: -K-ras mutation negative, BRAF V600E positive, PIK3CA, CDKN2A,TP53 -Tolerating  opdivo every 2 weeks very well.  PET/CT scan on 04/17/2017 shows stable disease.  He does not have any immunotherapy related side effects.  Last TSH in February was within normal limits.  Last CEA in December 2018 was also within normal limits.  2.  Peripheral neuropathy: He has tingling which is continuous, in the hands and feet.  Fingers is far worse than in the feet.  He drops things.  He is currently on Neurontin 300 mg 3 times a day.  It is not helping him completely.  I will increase it to 600 mg 3 times a day.  He reports he is not getting drowsy with it.

## 2017-06-15 NOTE — Progress Notes (Signed)
1025 Labs reviewed with and pt seen by Dr. Delton Coombes today and pt approved for Opdivo infusion per MD                                                               Kevin Kirk tolerated Opdivo infusion well without complaints or incident. VSS upon discharge. Pt discharged self ambulatory in satisfactory condition

## 2017-06-15 NOTE — Patient Instructions (Signed)
[facility] Discharge Instructions  Today you saw Dr. K.   Thank you for choosing  Cancer Center at [facility] to provide your oncology and hematology care.  To afford each patient quality time with our provider, please arrive at least 15 minutes before your scheduled appointment time.   If you have a lab appointment with the Cancer Center please come in thru the  Main Entrance and check in at the main information desk  You need to re-schedule your appointment should you arrive 10 or more minutes late.  We strive to give you quality time with our providers, and arriving late affects you and other patients whose appointments are after yours.  Also, if you no show three or more times for appointments you may be dismissed from the clinic at the providers discretion.     Again, thank you for choosing [facility].  Our hope is that these requests will decrease the amount of time that you wait before being seen by our physicians.       _____________________________________________________________  Should you have questions after your visit to  Cancer Center, please contact our office at [phone] between the hours of 8:30 a.m. and 4:30 p.m.  Voicemails left after 4:30 p.m. will not be returned until the following business day.  For prescription refill requests, have your pharmacy contact our office.       Resources For Cancer Patients and their Caregivers ? American Cancer Society: Can assist with transportation, wigs, general needs, runs Look Good Feel Better.        1-888-227-6333 ? Cancer Care: Provides financial assistance, online support groups, medication/co-pay assistance.  1-800-813-HOPE (4673) ? [facility] Assists [place] cancer patients and their families through emotional , educational and financial support.  336-427-4357 ? [place] DSS Where to apply for food  stamps, Medicaid and utility assistance. 336-342-1394 ? [facility]: Transportation to medical appointments. 336-347-2287 ? Social Security Administration: May apply for disability if have a Stage IV cancer. 336-342-7796 1-800-772-1213 ? [place] Aging, Disability and Transit Services: Assists with nutrition, care and transit needs. 336-349-2343  Cancer Center Support Programs:   > Cancer Support Group  2nd Tuesday of the month 1pm-2pm, Journey Room   > Creative Journey  3rd Tuesday of the month 1130am-1pm, Journey Room    

## 2017-06-15 NOTE — Patient Instructions (Signed)
[facility] Discharge Instructions for Patients Receiving Chemotherapy   Beginning January 23rd 2017 lab work for the Cancer Center will be done in the  Main lab at [place] on 1st floor. If you have a lab appointment with the Cancer Center please come in thru the  Main Entrance and check in at the main information desk   Today you received the following chemotherapy agents Opdivo. Follow-up as scheduled. Call clinic for any questions or concerns  To help prevent nausea and vomiting after your treatment, we encourage you to take your nausea medication   If you develop nausea and vomiting, or diarrhea that is not controlled by your medication, call the clinic.  The clinic phone number is [phone]. Office hours are Monday-Friday 8:30am-5:00pm.  BELOW ARE SYMPTOMS THAT SHOULD BE REPORTED IMMEDIATELY:  *FEVER GREATER THAN 101.0 F  *CHILLS WITH OR WITHOUT FEVER  NAUSEA AND VOMITING THAT IS NOT CONTROLLED WITH YOUR NAUSEA MEDICATION  *UNUSUAL SHORTNESS OF BREATH  *UNUSUAL BRUISING OR BLEEDING  TENDERNESS IN MOUTH AND THROAT WITH OR WITHOUT PRESENCE OF ULCERS  *URINARY PROBLEMS  *BOWEL PROBLEMS  UNUSUAL RASH Items with * indicate a potential emergency and should be followed up as soon as possible. If you have an emergency after office hours please contact your primary care physician or go to the nearest emergency department.  Please call the clinic during office hours if you have any questions or concerns.   You may also contact the Patient Navigator at [phone] should you have any questions or need assistance in obtaining follow up care.      Resources For Cancer Patients and their Caregivers ? American Cancer Society: Can assist with transportation, wigs, general needs, runs Look Good Feel Better.        1-888-227-6333 ? Cancer Care: Provides financial assistance, online support groups, medication/co-pay assistance.  1-800-813-HOPE  (4673) ? [facility] Assists [place] cancer patients and their families through emotional , educational and financial support.  336-427-4357 ? [place] DSS Where to apply for food stamps, Medicaid and utility assistance. 336-342-1394 ? RCATS: Transportation to medical appointments. 336-347-2287 ? Social Security Administration: May apply for disability if have a Stage IV cancer. 336-342-7796 1-800-772-1213 ? [place] Aging, Disability and Transit Services: Assists with nutrition, care and transit needs. 336-349-2343         

## 2017-06-17 ENCOUNTER — Other Ambulatory Visit (HOSPITAL_COMMUNITY): Payer: Self-pay | Admitting: Emergency Medicine

## 2017-06-17 MED ORDER — GABAPENTIN 600 MG PO TABS: 600.0000 mg | ORAL_TABLET | Freq: Three times a day (TID) | ORAL | 2 refills | Status: DC

## 2017-06-17 NOTE — Progress Notes (Signed)
Gabapentin refilled.  Increased dose to 600 mg TID per Dr Raliegh Ip.  Let Kevin Kirk know that we sent in 600 mg capsules and he would take 1 three times a day.  He verbalized understanding.

## 2017-06-19 ENCOUNTER — Telehealth (HOSPITAL_COMMUNITY): Payer: Self-pay

## 2017-06-19 NOTE — Telephone Encounter (Signed)
Patient called and left message stating he saw Dr. Raliegh Ip on Monday and was supposed to have a prescription sent in. He did not tell what medication he is talking about. Attempted to call patient on both numbers listed. Unable to reach him or leave a message. Will continue to try. After reviewing chart, Dr. Raliegh Ip was going to increase his gabapentin. New prescription for gabapentin sent in to patients pharmacy Wednesday by Toy Cookey, RN. Will continue to try to notify patient.

## 2017-06-19 NOTE — Telephone Encounter (Signed)
Patient called back and states he did get the new prescription for gabapentin.

## 2017-06-29 ENCOUNTER — Encounter (HOSPITAL_COMMUNITY): Payer: Self-pay

## 2017-06-29 ENCOUNTER — Inpatient Hospital Stay (HOSPITAL_COMMUNITY): Payer: Medicare Other | Attending: Hematology and Oncology

## 2017-06-29 VITALS — BP 106/56 | HR 71 | Temp 97.7°F | Resp 18 | Wt 202.4 lb

## 2017-06-29 DIAGNOSIS — C184 Malignant neoplasm of transverse colon: Secondary | ICD-10-CM | POA: Insufficient documentation

## 2017-06-29 DIAGNOSIS — Z79899 Other long term (current) drug therapy: Secondary | ICD-10-CM | POA: Diagnosis not present

## 2017-06-29 DIAGNOSIS — G629 Polyneuropathy, unspecified: Secondary | ICD-10-CM | POA: Diagnosis not present

## 2017-06-29 DIAGNOSIS — Z5112 Encounter for antineoplastic immunotherapy: Secondary | ICD-10-CM | POA: Insufficient documentation

## 2017-06-29 LAB — COMPREHENSIVE METABOLIC PANEL
ALT: 29 U/L (ref 17–63)
AST: 29 U/L (ref 15–41)
Albumin: 3.9 g/dL (ref 3.5–5.0)
Alkaline Phosphatase: 44 U/L (ref 38–126)
Anion gap: 9 (ref 5–15)
BUN: 18 mg/dL (ref 6–20)
CO2: 28 mmol/L (ref 22–32)
Calcium: 9.4 mg/dL (ref 8.9–10.3)
Chloride: 99 mmol/L — ABNORMAL LOW (ref 101–111)
Creatinine, Ser: 0.84 mg/dL (ref 0.61–1.24)
GFR calc Af Amer: >60 mL/min (ref >60)
GFR calc non Af Amer: >60 mL/min (ref >60)
Glucose, Bld: 117 mg/dL — ABNORMAL HIGH (ref 65–99)
Potassium: 4.4 mmol/L (ref 3.5–5.1)
Sodium: 136 mmol/L (ref 135–145)
Total Bilirubin: 1 mg/dL (ref 0.3–1.2)
Total Protein: 7.6 g/dL (ref 6.5–8.1)

## 2017-06-29 LAB — CBC WITH DIFFERENTIAL/PLATELET
Basophils Absolute: 0 10*3/uL (ref 0.0–0.1)
Basophils Relative: 0 %
Eosinophils Absolute: 0.5 10*3/uL (ref 0.0–0.7)
Eosinophils Relative: 5 %
HCT: 39.2 % (ref 39.0–52.0)
Hemoglobin: 12.4 g/dL — ABNORMAL LOW (ref 13.0–17.0)
Lymphocytes Relative: 29 %
Lymphs Abs: 3 10*3/uL (ref 0.7–4.0)
MCH: 31.8 pg (ref 26.0–34.0)
MCHC: 31.6 g/dL (ref 30.0–36.0)
MCV: 100.5 fL — ABNORMAL HIGH (ref 78.0–100.0)
Monocytes Absolute: 1.1 10*3/uL — ABNORMAL HIGH (ref 0.1–1.0)
Monocytes Relative: 11 %
Neutro Abs: 5.6 10*3/uL (ref 1.7–7.7)
Neutrophils Relative %: 55 %
Platelets: 240 10*3/uL (ref 150–400)
RBC: 3.9 MIL/uL — ABNORMAL LOW (ref 4.22–5.81)
RDW: 12.4 % (ref 11.5–15.5)
WBC: 10.3 10*3/uL (ref 4.0–10.5)

## 2017-06-29 LAB — LACTATE DEHYDROGENASE: LDH: 147 U/L (ref 98–192)

## 2017-06-29 MED ORDER — SODIUM CHLORIDE 0.9% FLUSH
10.0000 mL | INTRAVENOUS | Status: DC | PRN
  Administered 2017-06-29: 10 mL
  Filled 2017-06-29: qty 10

## 2017-06-29 MED ORDER — SODIUM CHLORIDE 0.9 % IV SOLN
Freq: Once | INTRAVENOUS | Status: AC
  Administered 2017-06-29: 12:00:00 via INTRAVENOUS

## 2017-06-29 MED ORDER — HEPARIN SOD (PORK) LOCK FLUSH 100 UNIT/ML IV SOLN
500.0000 [IU] | Freq: Once | INTRAVENOUS | Status: AC | PRN
  Administered 2017-06-29: 500 [IU]

## 2017-06-29 MED ORDER — SODIUM CHLORIDE 0.9 % IV SOLN
240.0000 mg | Freq: Once | INTRAVENOUS | Status: AC
  Administered 2017-06-29: 240 mg via INTRAVENOUS
  Filled 2017-06-29: qty 24

## 2017-06-29 NOTE — Progress Notes (Signed)
Odas H Diefenderfer tolerated Opdivo infusion well without complaints or incident. Labs reviewed with Dr. Delton Coombes prior to administering this medication. VSS upon discharge. Pt discharged self ambulatory in satisfactory condition accompanied by a friend

## 2017-06-29 NOTE — Patient Instructions (Signed)
[facility] Discharge Instructions for Patients Receiving Chemotherapy   Beginning January 23rd 2017 lab work for the Cancer Center will be done in the  Main lab at [place] on 1st floor. If you have a lab appointment with the Cancer Center please come in thru the  Main Entrance and check in at the main information desk   Today you received the following chemotherapy agents Opdivo. Follow-up as scheduled. Call clinic for any questions or concerns  To help prevent nausea and vomiting after your treatment, we encourage you to take your nausea medication   If you develop nausea and vomiting, or diarrhea that is not controlled by your medication, call the clinic.  The clinic phone number is [phone]. Office hours are Monday-Friday 8:30am-5:00pm.  BELOW ARE SYMPTOMS THAT SHOULD BE REPORTED IMMEDIATELY:  *FEVER GREATER THAN 101.0 F  *CHILLS WITH OR WITHOUT FEVER  NAUSEA AND VOMITING THAT IS NOT CONTROLLED WITH YOUR NAUSEA MEDICATION  *UNUSUAL SHORTNESS OF BREATH  *UNUSUAL BRUISING OR BLEEDING  TENDERNESS IN MOUTH AND THROAT WITH OR WITHOUT PRESENCE OF ULCERS  *URINARY PROBLEMS  *BOWEL PROBLEMS  UNUSUAL RASH Items with * indicate a potential emergency and should be followed up as soon as possible. If you have an emergency after office hours please contact your primary care physician or go to the nearest emergency department.  Please call the clinic during office hours if you have any questions or concerns.   You may also contact the Patient Navigator at [phone] should you have any questions or need assistance in obtaining follow up care.      Resources For Cancer Patients and their Caregivers ? American Cancer Society: Can assist with transportation, wigs, general needs, runs Look Good Feel Better.        1-888-227-6333 ? Cancer Care: Provides financial assistance, online support groups, medication/co-pay assistance.  1-800-813-HOPE  (4673) ? [facility] Assists [place] cancer patients and their families through emotional , educational and financial support.  336-427-4357 ? [place] DSS Where to apply for food stamps, Medicaid and utility assistance. 336-342-1394 ? RCATS: Transportation to medical appointments. 336-347-2287 ? Social Security Administration: May apply for disability if have a Stage IV cancer. 336-342-7796 1-800-772-1213 ? [place] Aging, Disability and Transit Services: Assists with nutrition, care and transit needs. 336-349-2343         

## 2017-07-13 ENCOUNTER — Inpatient Hospital Stay (HOSPITAL_COMMUNITY): Payer: Medicare Other

## 2017-07-13 ENCOUNTER — Encounter (HOSPITAL_COMMUNITY): Payer: Self-pay | Admitting: Hematology

## 2017-07-13 ENCOUNTER — Inpatient Hospital Stay (HOSPITAL_BASED_OUTPATIENT_CLINIC_OR_DEPARTMENT_OTHER): Payer: Medicare Other | Admitting: Hematology

## 2017-07-13 ENCOUNTER — Other Ambulatory Visit: Payer: Self-pay

## 2017-07-13 VITALS — BP 118/61 | HR 64 | Temp 97.9°F | Resp 18 | Wt 204.6 lb

## 2017-07-13 DIAGNOSIS — C184 Malignant neoplasm of transverse colon: Secondary | ICD-10-CM

## 2017-07-13 DIAGNOSIS — G629 Polyneuropathy, unspecified: Secondary | ICD-10-CM | POA: Diagnosis not present

## 2017-07-13 DIAGNOSIS — Z5112 Encounter for antineoplastic immunotherapy: Secondary | ICD-10-CM | POA: Diagnosis not present

## 2017-07-13 DIAGNOSIS — Z79899 Other long term (current) drug therapy: Secondary | ICD-10-CM | POA: Diagnosis not present

## 2017-07-13 LAB — CBC WITH DIFFERENTIAL/PLATELET
Basophils Absolute: 0 10*3/uL (ref 0.0–0.1)
Basophils Relative: 0 %
Eosinophils Absolute: 0.4 10*3/uL (ref 0.0–0.7)
Eosinophils Relative: 4 %
HCT: 38.4 % — ABNORMAL LOW (ref 39.0–52.0)
Hemoglobin: 12.2 g/dL — ABNORMAL LOW (ref 13.0–17.0)
Lymphocytes Relative: 31 %
Lymphs Abs: 2.9 10*3/uL (ref 0.7–4.0)
MCH: 31.9 pg (ref 26.0–34.0)
MCHC: 31.8 g/dL (ref 30.0–36.0)
MCV: 100.3 fL — ABNORMAL HIGH (ref 78.0–100.0)
Monocytes Absolute: 1.1 10*3/uL — ABNORMAL HIGH (ref 0.1–1.0)
Monocytes Relative: 12 %
Neutro Abs: 4.9 10*3/uL (ref 1.7–7.7)
Neutrophils Relative %: 53 %
Platelets: 246 10*3/uL (ref 150–400)
RBC: 3.83 MIL/uL — ABNORMAL LOW (ref 4.22–5.81)
RDW: 12.6 % (ref 11.5–15.5)
WBC: 9.4 10*3/uL (ref 4.0–10.5)

## 2017-07-13 LAB — COMPREHENSIVE METABOLIC PANEL
ALT: 24 U/L (ref 17–63)
AST: 26 U/L (ref 15–41)
Albumin: 3.7 g/dL (ref 3.5–5.0)
Alkaline Phosphatase: 47 U/L (ref 38–126)
Anion gap: 12 (ref 5–15)
BUN: 20 mg/dL (ref 6–20)
CO2: 24 mmol/L (ref 22–32)
Calcium: 9.3 mg/dL (ref 8.9–10.3)
Chloride: 100 mmol/L — ABNORMAL LOW (ref 101–111)
Creatinine, Ser: 0.92 mg/dL (ref 0.61–1.24)
GFR calc Af Amer: >60 mL/min (ref >60)
GFR calc non Af Amer: >60 mL/min (ref >60)
Glucose, Bld: 132 mg/dL — ABNORMAL HIGH (ref 65–99)
Potassium: 4.7 mmol/L (ref 3.5–5.1)
Sodium: 136 mmol/L (ref 135–145)
Total Bilirubin: 0.7 mg/dL (ref 0.3–1.2)
Total Protein: 7.3 g/dL (ref 6.5–8.1)

## 2017-07-13 LAB — LACTATE DEHYDROGENASE: LDH: 148 U/L (ref 98–192)

## 2017-07-13 MED ORDER — HEPARIN SOD (PORK) LOCK FLUSH 100 UNIT/ML IV SOLN
500.0000 [IU] | Freq: Once | INTRAVENOUS | Status: AC | PRN
  Administered 2017-07-13: 500 [IU]

## 2017-07-13 MED ORDER — SODIUM CHLORIDE 0.9% FLUSH
10.0000 mL | INTRAVENOUS | Status: DC | PRN
  Administered 2017-07-13: 10 mL
  Filled 2017-07-13: qty 10

## 2017-07-13 MED ORDER — SODIUM CHLORIDE 0.9 % IV SOLN
240.0000 mg | Freq: Once | INTRAVENOUS | Status: AC
  Administered 2017-07-13: 240 mg via INTRAVENOUS
  Filled 2017-07-13: qty 24

## 2017-07-13 MED ORDER — PEGFILGRASTIM 6 MG/0.6ML ~~LOC~~ PSKT
PREFILLED_SYRINGE | SUBCUTANEOUS | Status: AC
  Filled 2017-07-13: qty 0.6

## 2017-07-13 MED ORDER — SODIUM CHLORIDE 0.9 % IV SOLN
Freq: Once | INTRAVENOUS | Status: AC
  Administered 2017-07-13: 11:00:00 via INTRAVENOUS

## 2017-07-13 NOTE — Patient Instructions (Signed)
[facility] Discharge Instructions for Patients Receiving Chemotherapy   Beginning January 23rd 2017 lab work for the Cancer Center will be done in the  Main lab at [facility] on 1st floor. If you have a lab appointment with the Cancer Center please come in thru the  Main Entrance and check in at the main information desk   Today you received the following chemotherapy agents   To help prevent nausea and vomiting after your treatment, we encourage you to take your nausea medication     If you develop nausea and vomiting, or diarrhea that is not controlled by your medication, call the clinic.  The clinic phone number is [phone]. Office hours are Monday-Friday 8:30am-5:00pm.  BELOW ARE SYMPTOMS THAT SHOULD BE REPORTED IMMEDIATELY:  *FEVER GREATER THAN 101.0 F  *CHILLS WITH OR WITHOUT FEVER  NAUSEA AND VOMITING THAT IS NOT CONTROLLED WITH YOUR NAUSEA MEDICATION  *UNUSUAL SHORTNESS OF BREATH  *UNUSUAL BRUISING OR BLEEDING  TENDERNESS IN MOUTH AND THROAT WITH OR WITHOUT PRESENCE OF ULCERS  *URINARY PROBLEMS  *BOWEL PROBLEMS  UNUSUAL RASH Items with * indicate a potential emergency and should be followed up as soon as possible. If you have an emergency after office hours please contact your primary care physician or go to the nearest emergency department.  Please call the clinic during office hours if you have any questions or concerns.   You may also contact the Patient Navigator at [phone] should you have any questions or need assistance in obtaining follow up care.      Resources For Cancer Patients and their Caregivers ? American Cancer Society: Can assist with transportation, wigs, general needs, runs Look Good Feel Better.        1-888-227-6333 ? Cancer Care: Provides financial assistance, online support groups, medication/co-pay assistance.  1-800-813-HOPE (4673) ? [facility] Assists [place] cancer  patients and their families through emotional , educational and financial support.  336-427-4357 ? [place] DSS Where to apply for food stamps, Medicaid and utility assistance. 336-342-1394 ? RCATS: Transportation to medical appointments. 336-347-2287 ? Social Security Administration: May apply for disability if have a Stage IV cancer. 336-342-7796 1-800-772-1213 ? [place] Aging, Disability and Transit Services: Assists with nutrition, care and transit needs. 336-349-2343         

## 2017-07-13 NOTE — Progress Notes (Signed)
Lab reviewed with MD. Proceed with treatment today.  Treatment given per orders. Patient tolerated it well without problems. Vitals stable and discharged home from clinic ambulatory. Follow up as scheduled.

## 2017-07-13 NOTE — Progress Notes (Signed)
[facility] [address], [place] [address]   CLINIC:  Medical Oncology/Hematology  PCP:  Tobe Sos, MD [address] [place] [address] [phone]   REASON FOR VISIT:  Follow-up for metastatic colon cancer.  CURRENT THERAPY: Opdivo every 2 weeks.   CANCER STAGING: Cancer Staging Malignant neoplasm of transverse colon [facility]) Staging form: Colon and Rectum, AJCC 8th Edition - Clinical: cT3, cN1a, cM1 - Signed by Twana First, MD on 03/09/2017 - Pathologic: No stage assigned - Unsigned    INTERVAL HISTORY:  Kevin Kirk 84 y.o. male returns for follow-up on next cycle of immunotherapy.  He denies any diarrhea or skin rashes.  Denies any headaches.  Denies any cough or change in breathing.  Denies any infections or hospitalizations.  Continues to live by himself and function independently.    REVIEW OF SYSTEMS:  Review of Systems  Constitutional: Negative.   HENT:  Negative.   Cardiovascular: Negative.   Gastrointestinal: Negative.   Musculoskeletal: Negative.   Skin: Negative.   Neurological: Positive for numbness.  Hematological: Negative.   Psychiatric/Behavioral: Negative.      PAST MEDICAL/SURGICAL HISTORY:  Past Medical History:  Diagnosis Date  . Chronic pulmonary embolism ([place]) 62/9528   compliction of the valve replacement surgery  . COPD (chronic obstructive pulmonary disease) ([place])   . Diabetes ([place]) 04/22/2016  . H/O aortic valve replacement 10/2008  . Hypercholesteremia   . Hypertension   . Pulmonary emboli ([place]) 10/2008  . Sleep apnea    Past Surgical History:  Procedure Laterality Date  . AORTIC VALVE REPLACEMENT     2010  . CARDIAC CATHETERIZATION  2010  . COLONOSCOPY N/A 06/05/2016   Procedure: COLONOSCOPY;  Surgeon: Rogene [place], MD;  Location: [facility] ENDO SUITE;  Service: Endoscopy;  Laterality: N/A;  . PARTIAL COLECTOMY N/A 07/11/2016   Procedure: PARTIAL COLECTOMY;  Surgeon: Aviva Signs, MD;  Location: [facility]  ORS;  Service: General;  Laterality: N/A;  . PORTACATH PLACEMENT N/A 08/13/2016   Procedure: INSERTION PORT-A-CATH LEFT SUBCLAVIAN;  Surgeon: Aviva Signs, MD;  Location: [facility] ORS;  Service: General;  Laterality: N/A;  . REPLACEMENT TOTAL KNEE     1996  . spenectomy     from trauma     SOCIAL HISTORY:  Social History   Socioeconomic History  . Marital status: Widowed    Spouse name: Not on file  . Number of children: Not on file  . Years of education: Not on file  . Highest education level: Not on file  Occupational History  . Not on file  Social Needs  . Financial resource strain: Not on file  . Food insecurity:    Worry: Not on file    Inability: Not on file  . Transportation needs:    Medical: Not on file    Non-medical: Not on file  Tobacco Use  . Smoking status: Former Smoker    Years: 15.00    Types: Cigarettes    Last attempt to quit: 1964    Years since quitting: 55.3  . Smokeless tobacco: Never Used  Substance and Sexual Activity  . Alcohol use: No  . Drug use: No  . Sexual activity: Yes  Lifestyle  . Physical activity:    Days per week: Not on file    Minutes per session: Not on file  . Stress: Not on file  Relationships  . Social connections:    Talks on phone: Not on file    Gets  together: Not on file    Attends religious service: Not on file    Active member of club or organization: Not on file    Attends meetings of clubs or organizations: Not on file    Relationship status: Not on file  . Intimate partner violence:    Fear of current or ex partner: Not on file    Emotionally abused: Not on file    Physically abused: Not on file    Forced sexual activity: Not on file  Other Topics Concern  . Not on file  Social History Narrative  . Not on file    FAMILY HISTORY:  Family History  Problem Relation Age of Onset  . Colon cancer Neg Hx     CURRENT MEDICATIONS:  Outpatient Encounter Medications as of 07/13/2017  Medication Sig Note  .  aspirin EC 81 MG tablet Take 81 mg by mouth daily.   Marland Kitchen atorvastatin (LIPITOR) 10 MG tablet Take 10 mg by mouth at bedtime.    . Cholecalciferol (VITAMIN D3) 5000 units CAPS Take 5,000 Units by mouth daily.    . Diphenhyd-Hydrocort-Nystatin (FIRST-DUKES MOUTHWASH) SUSP Use as directed 5 mLs in the mouth or throat 4 (four) times daily as needed.   . diphenoxylate-atropine (LOMOTIL) 2.5-0.025 MG tablet Take 1 tablet by mouth 4 (four) times daily as needed for diarrhea or loose stools.   . finasteride (PROSCAR) 5 MG tablet Take 5 mg by mouth at bedtime.    . gabapentin (NEURONTIN) 600 MG tablet Take 1 tablet (600 mg total) by mouth 3 (three) times daily.   Marland Kitchen lisinopril (PRINIVIL,ZESTRIL) 10 MG tablet    . lisinopril (PRINIVIL,ZESTRIL) 5 MG tablet Take 5 mg by mouth at bedtime.    . metFORMIN (GLUCOPHAGE) 500 MG tablet Take 500 mg by mouth 2 (two) times daily.   . metoprolol (LOPRESSOR) 50 MG tablet Take 50 mg by mouth 2 (two) times daily.   . Nivolumab (OPDIVO IV) Inject into the vein. Every 2 weeks   . vitamin B-12 (CYANOCOBALAMIN) 1000 MCG tablet Take 1,000 mcg by mouth daily.    . [DISCONTINUED] prochlorperazine (COMPAZINE) 10 MG tablet Take 1 tablet (10 mg total) by mouth every 6 (six) hours as needed (Nausea or vomiting). 08/05/2016: New Med   No facility-administered encounter medications on file as of 07/13/2017.     ALLERGIES:  Allergies  Allergen Reactions  . Amoxicillin Hives    Has patient had a PCN reaction causing immediate rash, facial/tongue/throat swelling, SOB or lightheadedness with hypotension: No Has patient had a PCN reaction causing severe rash involving mucus membranes or skin necrosis: Yes Has patient had a PCN reaction that required hospitalization No Has patient had a PCN reaction occurring within the last 10 years: No If all of the above answers are "NO", then may proceed with Cephalosporin use.   . Tamsulosin Hcl     Cant sleep, confusion      PHYSICAL EXAM:    ECOG Performance status: 1 I have reviewed his vitals. Physical Exam  Constitutional: He appears well-developed and well-nourished.  Skin: Skin is warm.  Psychiatric: He has a normal mood and affect. His behavior is normal. Judgment and thought content normal.     LABORATORY DATA:  I have reviewed the labs as listed.  CBC    Component Value Date/Time   WBC 9.4 07/13/2017 1001   RBC 3.83 (L) 07/13/2017 1001   HGB 12.2 (L) 07/13/2017 1001   HCT 38.4 (L) 07/13/2017 1001  PLT 246 07/13/2017 1001   MCV 100.3 (H) 07/13/2017 1001   MCH 31.9 07/13/2017 1001   MCHC 31.8 07/13/2017 1001   RDW 12.6 07/13/2017 1001   LYMPHSABS 2.9 07/13/2017 1001   MONOABS 1.1 (H) 07/13/2017 1001   EOSABS 0.4 07/13/2017 1001   BASOSABS 0.0 07/13/2017 1001   CMP Latest Ref Rng & Units 07/13/2017 06/29/2017 06/15/2017  Glucose 65 - 99 mg/dL 132(H) 117(H) 128(H)  BUN 6 - 20 mg/dL '20 18 13  ' Creatinine 0.61 - 1.24 mg/dL 0.92 0.84 0.81  Sodium 135 - 145 mmol/L 136 136 136  Potassium 3.5 - 5.1 mmol/L 4.7 4.4 4.8  Chloride 101 - 111 mmol/L 100(L) 99(L) 100(L)  CO2 22 - 32 mmol/L '24 28 26  ' Calcium 8.9 - 10.3 mg/dL 9.3 9.4 9.3  Total Protein 6.5 - 8.1 g/dL 7.3 7.6 7.3  Total Bilirubin 0.3 - 1.2 mg/dL 0.7 1.0 1.0  Alkaline Phos 38 - 126 U/L 47 44 43  AST 15 - 41 U/L '26 29 29  ' ALT 17 - 63 U/L '24 29 26        ' ASSESSMENT & PLAN:   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HCC) 1.  Stage IV colon cancer (pT3 pN1 M1) MSI -High: -K-ras mutation negative, BRAF V600E positive, PIK3CA, CDKN2A,TP53 -Tolerating  opdivo every 2 weeks very well.  PET/CT scan on 04/17/2017 shows stable disease.  He does not report any immunotherapy related side effects.  His last TSH was within normal limits.  Today he will proceed with his next cycle.  I plan to repeat PET scan in the next 2 weeks.  We will meet again in 2 weeks to discuss results.  2.  Peripheral neuropathy: He has tingling which is continuous, in the hands and feet.   Fingers is far worse than in the feet.  He drops things.  He was taking Neurontin 300 mg 3 times a day.  I have told him to increase it to 600 mg 3 times a day.  However he is taking 600 mg twice daily and it is helping him.      Orders placed this encounter:  Orders Placed This Encounter  Procedures  . CBC with Differential  . Comprehensive metabolic panel  . TSH      Derek Jack, [place] [phone]

## 2017-07-13 NOTE — Assessment & Plan Note (Signed)
1.  Stage IV colon cancer (pT3 pN1 M1) MSI -High: -K-ras mutation negative, BRAF V600E positive, PIK3CA, CDKN2A,TP53 -Tolerating  opdivo every 2 weeks very well.  PET/CT scan on 04/17/2017 shows stable disease.  He does not report any immunotherapy related side effects.  His last TSH was within normal limits.  Today he will proceed with his next cycle.  I plan to repeat PET scan in the next 2 weeks.  We will meet again in 2 weeks to discuss results.  2.  Peripheral neuropathy: He has tingling which is continuous, in the hands and feet.  Fingers is far worse than in the feet.  He drops things.  He was taking Neurontin 300 mg 3 times a day.  I have told him to increase it to 600 mg 3 times a day.  However he is taking 600 mg twice daily and it is helping him.

## 2017-07-13 NOTE — Patient Instructions (Signed)
[facility] at [facility] Discharge Instructions  Today you saw Dr. K.   Thank you for choosing [facility] at [facility] to provide your oncology and hematology care.  To afford each patient quality time with our provider, please arrive at least 15 minutes before your scheduled appointment time.   If you have a lab appointment with the Cancer Center please come in thru the  Main Entrance and check in at the main information desk  You need to re-schedule your appointment should you arrive 10 or more minutes late.  We strive to give you quality time with our providers, and arriving late affects you and other patients whose appointments are after yours.  Also, if you no show three or more times for appointments you may be dismissed from the clinic at the providers discretion.     Again, thank you for choosing [facility].  Our hope is that these requests will decrease the amount of time that you wait before being seen by our physicians.       _____________________________________________________________  Should you have questions after your visit to  Cancer Center, please contact our office at [phone] between the hours of 8:30 a.m. and 4:30 p.m.  Voicemails left after 4:30 p.m. will not be returned until the following business day.  For prescription refill requests, have your pharmacy contact our office.       Resources For Cancer Patients and their Caregivers ? American Cancer Society: Can assist with transportation, wigs, general needs, runs Look Good Feel Better.        1-888-227-6333 ? Cancer Care: Provides financial assistance, online support groups, medication/co-pay assistance.  1-800-813-HOPE (4673) ? [facility] Assists [place] cancer patients and their families through emotional , educational and financial support.  336-427-4357 ? [place] DSS Where to apply for food  stamps, Medicaid and utility assistance. 336-342-1394 ? RCATS: Transportation to medical appointments. 336-347-2287 ? Social Security Administration: May apply for disability if have a Stage IV cancer. 336-342-7796 1-800-772-1213 ? [place] Aging, Disability and Transit Services: Assists with nutrition, care and transit needs. 336-349-2343  Cancer Center Support Programs:   > Cancer Support Group  2nd Tuesday of the month 1pm-2pm, Journey Room   > Creative Journey  3rd Tuesday of the month 1130am-1pm, Journey Room    

## 2017-07-20 ENCOUNTER — Encounter (HOSPITAL_COMMUNITY)
Admission: RE | Admit: 2017-07-20 | Discharge: 2017-07-20 | Disposition: A | Discharge status: Home / Self Care | Payer: Medicare Other | Source: Ambulatory Visit | Attending: Internal Medicine | Admitting: Internal Medicine

## 2017-07-20 ENCOUNTER — Encounter (HOSPITAL_COMMUNITY): Payer: Self-pay

## 2017-07-20 ENCOUNTER — Inpatient Hospital Stay (HOSPITAL_COMMUNITY): Payer: Medicare Other

## 2017-07-20 DIAGNOSIS — Z5112 Encounter for antineoplastic immunotherapy: Secondary | ICD-10-CM | POA: Diagnosis not present

## 2017-07-20 DIAGNOSIS — C184 Malignant neoplasm of transverse colon: Secondary | ICD-10-CM | POA: Insufficient documentation

## 2017-07-20 MED ORDER — SODIUM CHLORIDE 0.9% FLUSH
10.0000 mL | INTRAVENOUS | Status: DC | PRN
  Administered 2017-07-20: 10 mL via INTRAVENOUS
  Filled 2017-07-20: qty 10

## 2017-07-20 MED ORDER — FLUDEOXYGLUCOSE F - 18 (FDG) INJECTION
10.0000 | Freq: Once | INTRAVENOUS | Status: AC | PRN
  Administered 2017-07-20: 11.5 via INTRAVENOUS

## 2017-07-20 MED ORDER — HEPARIN SOD (PORK) LOCK FLUSH 100 UNIT/ML IV SOLN
500.0000 [IU] | Freq: Once | INTRAVENOUS | Status: AC
  Administered 2017-07-20: 500 [IU] via INTRAVENOUS

## 2017-07-20 NOTE — Patient Instructions (Signed)
[facility] at [facility] Discharge Instructions  Portacath accessed for scans then flushed per protocol. Follow-up as scheduled. Call clinic for any questions or concerns   Thank you for choosing [facility] at [facility] to provide your oncology and hematology care.  To afford each patient quality time with our provider, please arrive at least 15 minutes before your scheduled appointment time.   If you have a lab appointment with the [facility] please come in thru the  Main Entrance and check in at the main information desk  You need to re-schedule your appointment should you arrive 10 or more minutes late.  We strive to give you quality time with our providers, and arriving late affects you and other patients whose appointments are after yours.  Also, if you no show three or more times for appointments you may be dismissed from the clinic at the providers discretion.     Again, thank you for choosing [facility].  Our hope is that these requests will decrease the amount of time that you wait before being seen by our physicians.       _____________________________________________________________  Should you have questions after your visit to [facility], please contact our office at [phone] between the hours of 8:30 a.m. and 4:30 p.m.  Voicemails left after 4:30 p.m. will not be returned until the following business day.  For prescription refill requests, have your pharmacy contact our office.       Resources For Cancer Patients and their Caregivers ? American Cancer Society: Can assist with transportation, wigs, general needs, runs Look Good Feel Better.        802-856-7154 ? Cancer Care: Provides financial assistance, online support groups, medication/co-pay assistance.  1-800-813-HOPE 365-747-4781) ? [facility] Assists [place] cancer patients and their families through emotional  , educational and financial support.  351-654-1899 ? [place] DSS Where to apply for food stamps, Medicaid and utility assistance. 203 248 3441 ? RCATS: Transportation to medical appointments. 631-103-8036 ? Social Security Administration: May apply for disability if have a Stage IV cancer. (463)637-1201 5866499844 ? [employer], Disability and Transit Services: Assists with nutrition, care and transit needs. [place] Support Programs:   > Cancer Support Group  2nd Tuesday of the month 1pm-2pm, Journey Room   > Creative Journey  3rd Tuesday of the month 1130am-1pm, Journey Room

## 2017-07-20 NOTE — Progress Notes (Signed)
Kevin Kirk tolerated port access for scan with flush well without complaints or incident. Port accessed with 20 gauge needle with blood return noted then flushed with 10 ml NS and sent to radiology. After injection of IV contrast port flushed with 5 ml Heparin easily then de-accessed.Pt remains in PET scan mobile unit for the scan to be completed. Pt left in satisfactory condition

## 2017-07-27 ENCOUNTER — Inpatient Hospital Stay (HOSPITAL_COMMUNITY): Payer: Medicare Other

## 2017-07-27 ENCOUNTER — Other Ambulatory Visit: Payer: Self-pay

## 2017-07-27 ENCOUNTER — Other Ambulatory Visit (HOSPITAL_COMMUNITY): Payer: Self-pay | Admitting: Internal Medicine

## 2017-07-27 ENCOUNTER — Encounter (HOSPITAL_COMMUNITY): Payer: Self-pay

## 2017-07-27 ENCOUNTER — Inpatient Hospital Stay (HOSPITAL_COMMUNITY): Payer: Medicare Other | Attending: Hematology and Oncology | Admitting: Internal Medicine

## 2017-07-27 VITALS — BP 119/56 | HR 58 | Temp 97.8°F | Resp 18 | Wt 203.4 lb

## 2017-07-27 DIAGNOSIS — C184 Malignant neoplasm of transverse colon: Secondary | ICD-10-CM

## 2017-07-27 DIAGNOSIS — G629 Polyneuropathy, unspecified: Secondary | ICD-10-CM | POA: Insufficient documentation

## 2017-07-27 DIAGNOSIS — Z79899 Other long term (current) drug therapy: Secondary | ICD-10-CM | POA: Diagnosis not present

## 2017-07-27 DIAGNOSIS — Z5112 Encounter for antineoplastic immunotherapy: Secondary | ICD-10-CM | POA: Insufficient documentation

## 2017-07-27 DIAGNOSIS — D509 Iron deficiency anemia, unspecified: Secondary | ICD-10-CM | POA: Insufficient documentation

## 2017-07-27 DIAGNOSIS — Z95828 Presence of other vascular implants and grafts: Secondary | ICD-10-CM

## 2017-07-27 DIAGNOSIS — I10 Essential (primary) hypertension: Secondary | ICD-10-CM | POA: Insufficient documentation

## 2017-07-27 DIAGNOSIS — C189 Malignant neoplasm of colon, unspecified: Secondary | ICD-10-CM

## 2017-07-27 LAB — CBC WITH DIFFERENTIAL/PLATELET
Basophils Absolute: 0.1 10*3/uL (ref 0.0–0.1)
Basophils Relative: 1 %
Eosinophils Absolute: 0.3 10*3/uL (ref 0.0–0.7)
Eosinophils Relative: 3 %
HCT: 38.1 % — ABNORMAL LOW (ref 39.0–52.0)
Hemoglobin: 12.4 g/dL — ABNORMAL LOW (ref 13.0–17.0)
Lymphocytes Relative: 30 %
Lymphs Abs: 2.9 10*3/uL (ref 0.7–4.0)
MCH: 32.1 pg (ref 26.0–34.0)
MCHC: 32.5 g/dL (ref 30.0–36.0)
MCV: 98.7 fL (ref 78.0–100.0)
Monocytes Absolute: 1 10*3/uL (ref 0.1–1.0)
Monocytes Relative: 10 %
Neutro Abs: 5.5 10*3/uL (ref 1.7–7.7)
Neutrophils Relative %: 56 %
Platelets: 241 10*3/uL (ref 150–400)
RBC: 3.86 MIL/uL — ABNORMAL LOW (ref 4.22–5.81)
RDW: 12.5 % (ref 11.5–15.5)
WBC: 9.7 10*3/uL (ref 4.0–10.5)

## 2017-07-27 LAB — COMPREHENSIVE METABOLIC PANEL
ALT: 22 U/L (ref 17–63)
AST: 23 U/L (ref 15–41)
Albumin: 3.8 g/dL (ref 3.5–5.0)
Alkaline Phosphatase: 43 U/L (ref 38–126)
Anion gap: 8 (ref 5–15)
BUN: 12 mg/dL (ref 6–20)
CO2: 27 mmol/L (ref 22–32)
Calcium: 9.3 mg/dL (ref 8.9–10.3)
Chloride: 102 mmol/L (ref 101–111)
Creatinine, Ser: 0.85 mg/dL (ref 0.61–1.24)
GFR calc Af Amer: >60 mL/min (ref >60)
GFR calc non Af Amer: >60 mL/min (ref >60)
Glucose, Bld: 135 mg/dL — ABNORMAL HIGH (ref 65–99)
Potassium: 4.3 mmol/L (ref 3.5–5.1)
Sodium: 137 mmol/L (ref 135–145)
Total Bilirubin: 0.9 mg/dL (ref 0.3–1.2)
Total Protein: 7.5 g/dL (ref 6.5–8.1)

## 2017-07-27 LAB — LACTATE DEHYDROGENASE: LDH: 153 U/L (ref 98–192)

## 2017-07-27 LAB — URINALYSIS, DIPSTICK ONLY
Bilirubin Urine: NEGATIVE
Glucose, UA: NEGATIVE mg/dL
Hgb urine dipstick: NEGATIVE
Ketones, ur: NEGATIVE mg/dL
Leukocytes, UA: NEGATIVE
Nitrite: NEGATIVE
Protein, ur: NEGATIVE mg/dL
Specific Gravity, Urine: 1.005 (ref 1.005–1.030)
pH: 7 (ref 5.0–8.0)

## 2017-07-27 LAB — TSH: TSH: 2.55 u[IU]/mL (ref 0.350–4.500)

## 2017-07-27 MED ORDER — DEXAMETHASONE SODIUM PHOSPHATE 10 MG/ML IJ SOLN
INTRAMUSCULAR | Status: AC
  Filled 2017-07-27: qty 1

## 2017-07-27 MED ORDER — FLUOROURACIL CHEMO INJECTION 2.5 GM/50ML
400.0000 mg/m2 | Freq: Once | INTRAVENOUS | Status: AC
  Administered 2017-07-27: 850 mg via INTRAVENOUS
  Filled 2017-07-27: qty 17

## 2017-07-27 MED ORDER — IRINOTECAN HCL CHEMO INJECTION 100 MG/5ML
180.0000 mg/m2 | Freq: Once | INTRAVENOUS | Status: AC
  Administered 2017-07-27: 400 mg via INTRAVENOUS
  Filled 2017-07-27: qty 15

## 2017-07-27 MED ORDER — DEXAMETHASONE SODIUM PHOSPHATE 10 MG/ML IJ SOLN
10.0000 mg | Freq: Once | INTRAMUSCULAR | Status: AC
  Administered 2017-07-27: 10 mg via INTRAVENOUS

## 2017-07-27 MED ORDER — SODIUM CHLORIDE 0.9 % IV SOLN
Freq: Once | INTRAVENOUS | Status: AC
  Administered 2017-07-27: 12:00:00 via INTRAVENOUS

## 2017-07-27 MED ORDER — ATROPINE SULFATE 1 MG/ML IJ SOLN
INTRAMUSCULAR | Status: AC
  Filled 2017-07-27: qty 1

## 2017-07-27 MED ORDER — SODIUM CHLORIDE 0.9 % IV SOLN
5.0000 mg/kg | Freq: Once | INTRAVENOUS | Status: AC
  Administered 2017-07-27: 450 mg via INTRAVENOUS
  Filled 2017-07-27: qty 2

## 2017-07-27 MED ORDER — SODIUM CHLORIDE 0.9 % IV SOLN
2400.0000 mg/m2 | INTRAVENOUS | Status: DC
  Administered 2017-07-27: 5250 mg via INTRAVENOUS
  Filled 2017-07-27: qty 100

## 2017-07-27 MED ORDER — PALONOSETRON HCL INJECTION 0.25 MG/5ML
0.2500 mg | Freq: Once | INTRAVENOUS | Status: AC
  Administered 2017-07-27: 0.25 mg via INTRAVENOUS

## 2017-07-27 MED ORDER — PALONOSETRON HCL INJECTION 0.25 MG/5ML
INTRAVENOUS | Status: AC
  Filled 2017-07-27: qty 5

## 2017-07-27 MED ORDER — SODIUM CHLORIDE 0.9 % IV SOLN: 10.0000 mg | Freq: Once | INTRAVENOUS | Status: DC

## 2017-07-27 MED ORDER — LEUCOVORIN CALCIUM INJECTION 350 MG
800.0000 mg | Freq: Once | INTRAVENOUS | Status: DC
  Filled 2017-07-27: qty 40

## 2017-07-27 MED ORDER — DEXTROSE 5 % IV SOLN
800.0000 mg | Freq: Once | INTRAVENOUS | Status: AC
  Administered 2017-07-27: 800 mg via INTRAVENOUS
  Filled 2017-07-27: qty 35

## 2017-07-27 MED ORDER — ATROPINE SULFATE 1 MG/ML IJ SOLN
0.5000 mg | Freq: Once | INTRAMUSCULAR | Status: AC
  Administered 2017-07-27: 0.5 mg via INTRAVENOUS

## 2017-07-27 MED ORDER — HEPARIN SOD (PORK) LOCK FLUSH 100 UNIT/ML IV SOLN
500.0000 [IU] | Freq: Once | INTRAVENOUS | Status: DC | PRN
Start: 2017-07-27 — End: 2017-07-27

## 2017-07-27 NOTE — Addendum Note (Signed)
Addended byZoila Shutter on: 07/27/2017 11:20 AM   Modules accepted: Orders

## 2017-07-27 NOTE — Progress Notes (Signed)
Tolerated infusions w/o adverse reaction.  Alert, in no distress.  VSS.  Discharged ambulatory in c/o friend with home infusion pump running as ordered.

## 2017-07-27 NOTE — Progress Notes (Signed)
Diagnosis No diagnosis found.  Staging Cancer Staging Malignant neoplasm of transverse colon [facility]) Staging form: Colon and Rectum, AJCC 8th Edition - Clinical: cT3, cN1a, cM1 - Signed by Twana First, MD on 03/09/2017 - Pathologic: No stage assigned - Unsigned   Assessment and Plan:  1.  Stage IV colon cancer (pT3 pN1 M1) MSI -High: -K-ras mutation negative, BRAF V600E positive, PIK3CA, CDKN2A,TP53  84 y.o. Male on immunotherapy with nivolumab for diagnosis of colon cancer under the direction of Dr Lebron Conners.  He has been on Nivo since 02/23/2017.    Mar[place] KitchenPET scan done 07/20/2017 shows  Shows IMPRESSION: 1. Increase in size and metabolic activity of the anterior left upper quadrant omental metastatic lesion.  Lesion went from 2.5 x 1.8 cm  to 4.1 x 4.1 cm with SUV 14.7 previously 10.6.   2. Nonspecific low-grade metabolic activity in several small left hilar lymph nodes, with activity mildly increased from 04/17/2017 but relatively similar to 11/20 09/2016. I am uncertain whether these are reactive or neoplastic. 3. Other imaging findings of potential clinical significance: Aortic Atherosclerosis (ICD10-I70.0). Chronic maxillary sinusitis. Coronary atherosclerosis. Ascending thoracic aortic aneurysm measured 4.4 cm diameter. Hyperdense stable left kidney upper pole lesion, likely a complex cyst.  Based on evidence of progression on recent Pet, I have discussed with him options of therapy with Folfiri and Avastin.   Pt will be treated with 20f 400 mg/m2 with 5 fu CIVI 2400 mg/m2 over 46 hours, Leucovorin 400 mg/m2, Irinotecan 180 mg/m2 with Avastin 5 mg/kg  Every 2 weeks.  Side effects of the medication were reviewed with pt including primarily bleeding and diarrhea.  He will be provided written information for review.    He will undergo teaching and pt will begin Folfiri and Avastin.  He will have repeat imaging in 3 months to assess response to therapy.    All questions answered and he  expressed understanding of the information presented.    2.  IDA.  Patient has been treated with IV iron in the past.  Hemoglobin is 12.4.  Will continue to monitor counts as therapy proceeds.  3.  Neuropathy.  We will continue to monitor this as therapy proceeds.  4.  Hypertension.  BP is  128/61.  Continue to follow-up with PCP for ongoing monitoring.    5.  Family history of GI cancers.  He reports this occurred in his brother.  Will discuss with genetic counselor as He has MSI High, BRAF + tumor.    Interval History:   84y.o. On nivolumab for diagnosis of recurrent colon cancer under the direction of Dr PLebron Conners    Current Status:  Pt is seen today for follow-up prior to C7 of Nivo.  He reports minor neuropathy.   Problem List Patient Active Problem List   Diagnosis Date Noted  . Port-A-Cath in place [[facility]02/25/2019  . Iron deficiency anemia [D50.9] 09/07/2016  . S/P partial colectomy [Z90.49] 07/11/2016  . 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[place] [C18.4]   . High cholesterol [E78.00] 04/22/2016  . H/O aortic valve replacement [Z95.2] 04/22/2016  . Diabetes ([place] [E11.9] 04/22/2016  . Chronic pulmonary embolism ([place] [I27.82] 04/22/2016  . Rectal bleeding [K62.5] 04/22/2016    Past Medical History Past Medical History:  Diagnosis Date  . Chronic pulmonary embolism ([place] [id number]  compliction of the valve replacement surgery  . COPD (chronic obstructive pulmonary disease) ([place]   . Diabetes ([place] 04/22/2016  . H/O aortic valve replacement 10/2008  . Hypercholesteremia   .  Hypertension   . Pulmonary emboli ([place]) 10/2008  . Sleep apnea     Past Surgical History Past Surgical History:  Procedure Laterality Date  . AORTIC VALVE REPLACEMENT     2010  . CARDIAC CATHETERIZATION  2010  . COLONOSCOPY N/A 06/05/2016   Procedure: COLONOSCOPY;  Surgeon: Rogene Houston, MD;  Location: [facility] END[facility] SU[facility];  Service: Endoscopy;  Laterality: N/A;  . PARTIAL COLECTOMY N/A  07/11/2016   Procedure: PARTIAL COLECTOMY;  Surgeon: Aviva Signs, MD;  Location: [facility] ORS;  Service: General;  Laterality: N/A;  . PORTACATH PLACEMENT N/A 08/13/2016   Procedure: INSERTION PORT-A-CATH LEFT SUBCLAVIAN;  Surgeon: Aviva Signs, MD;  Location: [facility] ORS;  Service: General;  Laterality: N/A;  . REPLACEMENT TOTAL KNEE     1996  . spenectomy     from trauma    Family History Family History  Problem Relation Age of Onset  . Colon cancer Neg Hx      Social History  reports that he quit smoking about 55 years ago. His smoking use included cigarettes. He quit after 15.00 years of use. He has never used smokeless tobacco. He reports that he does not drink alcohol or use drugs.  Medications  Current Outpatient Medications:  .  aspirin EC 81 MG tablet, Take 81 mg by mouth daily., Disp: , Rfl:  .  atorvastatin (LIPITOR) 10 MG tablet, Take 10 mg by mouth at bedtime. , Disp: , Rfl:  .  Cholecalciferol (VITAMIN D3) 5000 units CAPS, Take 5,000 Units by mouth daily. , Disp: , Rfl:  .  Diphenhyd-Hydrocort-Nystatin (FIRST-DUKES MOUTHWASH) SUSP, Use as directed 5 mLs in the mouth or throat 4 (four) times daily as needed., Disp: 237 mL, Rfl: 3 .  diphenoxylate-atropine (LOMOTIL) 2.5-0.025 MG tablet, Take 1 tablet by mouth 4 (four) times daily as needed for diarrhea or loose stools., Disp: 60 tablet, Rfl: 0 .  finasteride (PROSCAR) 5 MG tablet, Take 5 mg by mouth at bedtime. , Disp: , Rfl:  .  gabapentin (NEURONTIN) 600 MG tablet, Take 1 tablet (600 mg total) by mouth 3 (three) times daily., Disp: 90 tablet, Rfl: 2 .  lisinopril (PRINIVIL,ZESTRIL) 10 MG tablet, , Disp: , Rfl:  .  lisinopril (PRINIVIL,ZESTRIL) 5 MG tablet, Take 5 mg by mouth at bedtime. , Disp: , Rfl:  .  metFORMIN (GLUCOPHAGE) 500 MG tablet, Take 500 mg by mouth 2 (two) times daily., Disp: , Rfl:  .  metoprolol (LOPRESSOR) 50 MG tablet, Take 50 mg by mouth 2 (two) times daily., Disp: , Rfl:  .  Nivolumab (OPDIVO IV), Inject into  the vein. Every 2 weeks, Disp: , Rfl:  .  vitamin B-12 (CYANOCOBALAMIN) 1000 MCG tablet, Take 1,000 mcg by mouth daily. , Disp: , Rfl:   Allergies Amoxicillin and Tamsulosin hcl  Review of Systems Review of Systems - Oncology ROS as per HPI otherwise 12 point ROS is negative.   Physical Exam  Vitals Wt Readings from Last 3 Encounters:  07/27/17 203 lb 6.4 oz (92.3 kg)  07/13/17 204 lb 9.6 oz (92.8 kg)  06/29/17 202 lb 6.4 oz (91.8 kg)   Temp Readings from Last 3 Encounters:  07/27/17 97.6 F (36.4 C) (Oral)  07/13/17 97.9 F (36.6 C) (Oral)  06/29/17 97.7 F (36.5 C) (Oral)   BP Readings from Last 3 Encounters:  07/27/17 128/61  07/13/17 118/61  06/29/17 (!) 106/56   Pulse Readings from Last 3 Encounters:  07/27/17 64  07/13/17 64  06/29/17 71  Constitutional: Well-developed, well-nourished, and in no distress.   HENT: Head: Normocephalic and atraumatic.  Mouth/Throat: No oropharyngeal exudate. Mucosa moist. Eyes: Pupils are equal, round, and reactive to light. Conjunctivae are normal. No scleral icterus.  Neck: Normal range of motion. Neck supple. No JVD present.  Cardiovascular: Normal rate, regular rhythm and normal heart sounds.  Exam reveals no gallop and no friction rub.   No murmur heard. Pulmonary/Chest: Effort normal and breath sounds normal. No respiratory distress. No wheezes.No rales.  Abdominal: Soft. Bowel sounds are normal. No distension. There is no tenderness. There is no guarding.  Musculoskeletal: No edema or tenderness.  Lymphadenopathy: No cervical, axillary or supraclavicular adenopathy.  Neurological: Alert and oriented to person, place, and time. No cranial nerve deficit.  Skin: Skin is warm and dry. No rash noted. No erythema. No pallor.  Psychiatric: Affect and judgment normal.   Labs Infusion on 07/27/2017  Component Date Value Ref Range Status  . Sodium 07/27/2017 137  135 - 145 mmol/L Final  . Potassium 07/27/2017 4.3  3.5 - 5.1  mmol/L Final  . Chloride 07/27/2017 102  101 - 111 mmol/L Final  . CO2 07/27/2017 27  22 - 32 mmol/L Final  . Glucose, Bld 07/27/2017 135* 65 - 99 mg/dL Final  . BUN 07/27/2017 12  6 - 20 mg/dL Final  . Creatinine, Ser 07/27/2017 0.85  0.61 - 1.24 mg/dL Final  . Calcium 07/27/2017 9.3  8.9 - 10.3 mg/dL Final  . Total Protein 07/27/2017 7.5  6.5 - 8.1 g/dL Final  . Albumin 07/27/2017 3.8  3.5 - 5.0 g/dL Final  . AST 07/27/2017 23  15 - 41 U/L Final  . ALT 07/27/2017 22  17 - 63 U/L Final  . Alkaline Phosphatase 07/27/2017 43  38 - 126 U/L Final  . Total Bilirubin 07/27/2017 0.9  0.3 - 1.2 mg/dL Final  . GFR calc non Af Amer 07/27/2017 >60  >60 mL/min Final  . GFR calc Af Amer 07/27/2017 >60  >60 mL/min Final   Comment: (NOTE) The eGFR has been calculated using the CKD EPI equation. This calculation has not been validated in all clinical situations. eGFR's persistently <60 mL/min signify possible Chronic Kidney Disease.   Georgiann Hahn gap 07/27/2017 8  5 - 15 Final   Performed at [facility], [address] [place], [place] [address]  . WBC 07/27/2017 9.7  4.0 - 10.5 K/uL Final  . RBC 07/27/2017 3.86* 4.22 - 5.81 MIL/uL Final  . Hemoglobin 07/27/2017 12.4* 13.0 - 17.0 g/dL Final  . HCT 07/27/2017 38.1* 39.0 - 52.0 % Final  . MCV 07/27/2017 98.7  78.0 - 100.0 fL Final  . MCH 07/27/2017 32.1  26.0 - 34.0 pg Final  . MCHC 07/27/2017 32.5  30.0 - 36.0 g/dL Final  . RDW 07/27/2017 12.5  11.5 - 15.5 % Final  . Platelets 07/27/2017 241  150 - 400 K/uL Final  . Neutrophils Relative % 07/27/2017 56  % Final  . Neutro Abs 07/27/2017 5.5  1.7 - 7.7 K/uL Final  . Lymphocytes Relative 07/27/2017 30  % Final  . Lymphs Abs 07/27/2017 2.9  0.7 - 4.0 K/uL Final  . Monocytes Relative 07/27/2017 10  % Final  . Monocytes Absolute 07/27/2017 1.0  0.1 - 1.0 K/uL Final  . Eosinophils Relative 07/27/2017 3  % Final  . Eosinophils Absolute 07/27/2017 0.3  0.0 - 0.7 K/uL Final  . Basophils Relative  07/27/2017 1  % Final  . Basophils Absolute 07/27/2017  0.1  0.0 - 0.1 K/uL Final   Performed at [facility], [address], [place], [place] [address]  . LDH 07/27/2017 153  98 - 192 U/L Final   Performed at [facility], [address], [place], [place] [address]     Pathology No orders of the defined types were placed in this encounter.      Zoila Shutter MD

## 2017-07-28 ENCOUNTER — Other Ambulatory Visit (HOSPITAL_COMMUNITY): Payer: Self-pay | Admitting: *Deleted

## 2017-07-28 NOTE — Progress Notes (Signed)
Called pt for 24 hour f/u after receiving first infusions of Avastin and irinotecan.  He denies any adverse effects.  He will return to clinic tomorrow to have his home infusion pump d/c'd.

## 2017-07-29 ENCOUNTER — Inpatient Hospital Stay (HOSPITAL_COMMUNITY): Payer: Medicare Other

## 2017-07-29 ENCOUNTER — Encounter (HOSPITAL_COMMUNITY): Payer: Self-pay

## 2017-07-29 VITALS — BP 123/70 | HR 72 | Temp 97.8°F | Resp 18

## 2017-07-29 DIAGNOSIS — Z95828 Presence of other vascular implants and grafts: Secondary | ICD-10-CM

## 2017-07-29 DIAGNOSIS — C184 Malignant neoplasm of transverse colon: Secondary | ICD-10-CM

## 2017-07-29 DIAGNOSIS — C189 Malignant neoplasm of colon, unspecified: Secondary | ICD-10-CM

## 2017-07-29 DIAGNOSIS — Z5112 Encounter for antineoplastic immunotherapy: Secondary | ICD-10-CM | POA: Diagnosis not present

## 2017-07-29 MED ORDER — SODIUM CHLORIDE 0.9% FLUSH
10.0000 mL | Freq: Once | INTRAVENOUS | Status: AC
  Administered 2017-07-29: 10 mL via INTRAVENOUS

## 2017-07-29 MED ORDER — HEPARIN SOD (PORK) LOCK FLUSH 100 UNIT/ML IV SOLN
500.0000 [IU] | Freq: Once | INTRAVENOUS | Status: AC
  Administered 2017-07-29: 500 [IU] via INTRAVENOUS

## 2017-07-29 NOTE — Progress Notes (Signed)
Patients chemotherapy pump removed with no complaints of pain with flush.  Port site clean and dry with no bruising or swelling noted at site.  Band aid applied.  VSS with discharge and left ambulatory with no s/s of distress noted.

## 2017-07-29 NOTE — Patient Instructions (Signed)
[facility]  Discharge Instructions:  Your chemotherapy pump was removed today.  _______________________________________________________________  Thank you for choosing [facility] at [facility] to provide your oncology and hematology care.  To afford each patient quality time with our providers, please arrive at least 15 minutes before your scheduled appointment.  You need to re-schedule your appointment if you arrive 10 or more minutes late.  We strive to give you quality time with our providers, and arriving late affects you and other patients whose appointments are after yours.  Also, if you no show three or more times for appointments you may be dismissed from the clinic.  Again, thank you for choosing [facility] at  Hospital. Our hope is that these requests will allow you access to exceptional care and in a timely manner. _______________________________________________________________  If you have questions after your visit, please contact our office at [phone] between the hours of 8:30 a.m. and 5:00 p.m. Voicemails left after 4:30 p.m. will not be returned until the following business day. _______________________________________________________________  For prescription refill requests, have your pharmacy contact our office. _______________________________________________________________  Recommendations made by the consultant and any test results will be sent to your referring physician. _______________________________________________________________ 

## 2017-08-10 ENCOUNTER — Inpatient Hospital Stay (HOSPITAL_COMMUNITY): Payer: Medicare Other

## 2017-08-10 ENCOUNTER — Other Ambulatory Visit: Payer: Self-pay

## 2017-08-10 ENCOUNTER — Encounter (HOSPITAL_COMMUNITY): Payer: Self-pay

## 2017-08-10 VITALS — BP 126/65 | HR 60 | Temp 98.6°F | Resp 16 | Wt 200.4 lb

## 2017-08-10 DIAGNOSIS — Z5112 Encounter for antineoplastic immunotherapy: Secondary | ICD-10-CM | POA: Diagnosis not present

## 2017-08-10 DIAGNOSIS — Z95828 Presence of other vascular implants and grafts: Secondary | ICD-10-CM

## 2017-08-10 DIAGNOSIS — C184 Malignant neoplasm of transverse colon: Secondary | ICD-10-CM

## 2017-08-10 LAB — COMPREHENSIVE METABOLIC PANEL
ALT: 27 U/L (ref 17–63)
AST: 26 U/L (ref 15–41)
Albumin: 3.9 g/dL (ref 3.5–5.0)
Alkaline Phosphatase: 46 U/L (ref 38–126)
Anion gap: 9 (ref 5–15)
BUN: 14 mg/dL (ref 6–20)
CO2: 27 mmol/L (ref 22–32)
Calcium: 9.4 mg/dL (ref 8.9–10.3)
Chloride: 101 mmol/L (ref 101–111)
Creatinine, Ser: 0.99 mg/dL (ref 0.61–1.24)
GFR calc Af Amer: >60 mL/min (ref >60)
GFR calc non Af Amer: >60 mL/min (ref >60)
Glucose, Bld: 135 mg/dL — ABNORMAL HIGH (ref 65–99)
Potassium: 4.7 mmol/L (ref 3.5–5.1)
Sodium: 137 mmol/L (ref 135–145)
Total Bilirubin: 1 mg/dL (ref 0.3–1.2)
Total Protein: 7.3 g/dL (ref 6.5–8.1)

## 2017-08-10 LAB — CBC WITH DIFFERENTIAL/PLATELET
Basophils Absolute: 0 10*3/uL (ref 0.0–0.1)
Basophils Relative: 0 %
Eosinophils Absolute: 0.2 10*3/uL (ref 0.0–0.7)
Eosinophils Relative: 3 %
HCT: 37.9 % — ABNORMAL LOW (ref 39.0–52.0)
Hemoglobin: 12.2 g/dL — ABNORMAL LOW (ref 13.0–17.0)
Lymphocytes Relative: 46 %
Lymphs Abs: 3.1 10*3/uL (ref 0.7–4.0)
MCH: 32 pg (ref 26.0–34.0)
MCHC: 32.2 g/dL (ref 30.0–36.0)
MCV: 99.5 fL (ref 78.0–100.0)
Monocytes Absolute: 0.8 10*3/uL (ref 0.1–1.0)
Monocytes Relative: 12 %
Neutro Abs: 2.7 10*3/uL (ref 1.7–7.7)
Neutrophils Relative %: 39 %
Platelets: 206 10*3/uL (ref 150–400)
RBC: 3.81 MIL/uL — ABNORMAL LOW (ref 4.22–5.81)
RDW: 12.9 % (ref 11.5–15.5)
WBC: 6.8 10*3/uL (ref 4.0–10.5)

## 2017-08-10 LAB — LACTATE DEHYDROGENASE: LDH: 159 U/L (ref 98–192)

## 2017-08-10 LAB — URINALYSIS, DIPSTICK ONLY
Bilirubin Urine: NEGATIVE
Glucose, UA: NEGATIVE mg/dL
Hgb urine dipstick: NEGATIVE
Ketones, ur: NEGATIVE mg/dL
Leukocytes, UA: NEGATIVE
Nitrite: NEGATIVE
Protein, ur: NEGATIVE mg/dL
Specific Gravity, Urine: >1.03 — ABNORMAL HIGH (ref 1.005–1.030)
pH: 5.5 (ref 5.0–8.0)

## 2017-08-10 MED ORDER — IRINOTECAN HCL CHEMO INJECTION 100 MG/5ML
180.0000 mg/m2 | Freq: Once | INTRAVENOUS | Status: AC
  Administered 2017-08-10: 400 mg via INTRAVENOUS
  Filled 2017-08-10: qty 15

## 2017-08-10 MED ORDER — ATROPINE SULFATE 1 MG/ML IJ SOLN
0.5000 mg | Freq: Once | INTRAMUSCULAR | Status: AC
  Administered 2017-08-10: 0.5 mg via INTRAVENOUS
  Filled 2017-08-10: qty 1

## 2017-08-10 MED ORDER — DEXAMETHASONE SODIUM PHOSPHATE 10 MG/ML IJ SOLN
10.0000 mg | Freq: Once | INTRAMUSCULAR | Status: AC
  Administered 2017-08-10: 10 mg via INTRAVENOUS
  Filled 2017-08-10: qty 1

## 2017-08-10 MED ORDER — LEUCOVORIN CALCIUM INJECTION 350 MG
800.0000 mg | Freq: Once | INTRAVENOUS | Status: AC
  Administered 2017-08-10: 800 mg via INTRAVENOUS
  Filled 2017-08-10: qty 35

## 2017-08-10 MED ORDER — PALONOSETRON HCL INJECTION 0.25 MG/5ML
0.2500 mg | Freq: Once | INTRAVENOUS | Status: AC
  Administered 2017-08-10: 0.25 mg via INTRAVENOUS
  Filled 2017-08-10: qty 5

## 2017-08-10 MED ORDER — FLUOROURACIL CHEMO INJECTION 2.5 GM/50ML
400.0000 mg/m2 | Freq: Once | INTRAVENOUS | Status: AC
  Administered 2017-08-10: 850 mg via INTRAVENOUS
  Filled 2017-08-10: qty 17

## 2017-08-10 MED ORDER — SODIUM CHLORIDE 0.9 % IV SOLN
2400.0000 mg/m2 | INTRAVENOUS | Status: DC
  Administered 2017-08-10: 5250 mg via INTRAVENOUS
  Filled 2017-08-10: qty 100

## 2017-08-10 MED ORDER — SODIUM CHLORIDE 0.9 % IV SOLN
Freq: Once | INTRAVENOUS | Status: AC
  Administered 2017-08-10: 11:00:00 via INTRAVENOUS

## 2017-08-10 MED ORDER — SODIUM CHLORIDE 0.9% FLUSH
10.0000 mL | INTRAVENOUS | Status: DC | PRN
  Administered 2017-08-10: 10 mL
  Filled 2017-08-10: qty 10

## 2017-08-10 MED ORDER — BEVACIZUMAB CHEMO INJECTION 400 MG/16ML
5.0000 mg/kg | Freq: Once | INTRAVENOUS | Status: AC
  Administered 2017-08-10: 450 mg via INTRAVENOUS
  Filled 2017-08-10: qty 16

## 2017-08-10 MED ORDER — SODIUM CHLORIDE 0.9 % IV SOLN: 10.0000 mg | Freq: Once | INTRAVENOUS | Status: DC

## 2017-08-10 NOTE — Progress Notes (Signed)
Labs reviewed by Dr. Walden Field . Proceed with treatment today.  Treatment given per orders. Patient tolerated it well without problems. Vitals stable and discharged home from clinic ambulatory. Follow up as scheduled.

## 2017-08-10 NOTE — Patient Instructions (Signed)
[facility] Discharge Instructions for Patients Receiving Chemotherapy   Beginning January 23rd 2017 lab work for the Cancer Center will be done in the  Main lab at [facility] on 1st floor. If you have a lab appointment with the Cancer Center please come in thru the  Main Entrance and check in at the main information desk   Today you received the following chemotherapy agents   To help prevent nausea and vomiting after your treatment, we encourage you to take your nausea medication     If you develop nausea and vomiting, or diarrhea that is not controlled by your medication, call the clinic.  The clinic phone number is [phone]. Office hours are Monday-Friday 8:30am-5:00pm.  BELOW ARE SYMPTOMS THAT SHOULD BE REPORTED IMMEDIATELY:  *FEVER GREATER THAN 101.0 F  *CHILLS WITH OR WITHOUT FEVER  NAUSEA AND VOMITING THAT IS NOT CONTROLLED WITH YOUR NAUSEA MEDICATION  *UNUSUAL SHORTNESS OF BREATH  *UNUSUAL BRUISING OR BLEEDING  TENDERNESS IN MOUTH AND THROAT WITH OR WITHOUT PRESENCE OF ULCERS  *URINARY PROBLEMS  *BOWEL PROBLEMS  UNUSUAL RASH Items with * indicate a potential emergency and should be followed up as soon as possible. If you have an emergency after office hours please contact your primary care physician or go to the nearest emergency department.  Please call the clinic during office hours if you have any questions or concerns.   You may also contact the Patient Navigator at [phone] should you have any questions or need assistance in obtaining follow up care.      Resources For Cancer Patients and their Caregivers ? American Cancer Society: Can assist with transportation, wigs, general needs, runs Look Good Feel Better.        1-888-227-6333 ? Cancer Care: Provides financial assistance, online support groups, medication/co-pay assistance.  1-800-813-HOPE (4673) ? [facility] Assists [place] cancer  patients and their families through emotional , educational and financial support.  336-427-4357 ? [place] DSS Where to apply for food stamps, Medicaid and utility assistance. 336-342-1394 ? [facility]: Transportation to medical appointments. 336-347-2287 ? Social Security Administration: May apply for disability if have a Stage IV cancer. 336-342-7796 1-800-772-1213 ? [place] Aging, Disability and Transit Services: Assists with nutrition, care and transit needs. 336-349-2343         

## 2017-08-12 ENCOUNTER — Inpatient Hospital Stay (HOSPITAL_COMMUNITY): Payer: Medicare Other

## 2017-08-12 ENCOUNTER — Encounter (HOSPITAL_COMMUNITY): Payer: Self-pay

## 2017-08-12 ENCOUNTER — Other Ambulatory Visit: Payer: Self-pay

## 2017-08-12 VITALS — BP 116/56 | HR 68 | Temp 98.5°F | Resp 18

## 2017-08-12 DIAGNOSIS — Z5112 Encounter for antineoplastic immunotherapy: Secondary | ICD-10-CM | POA: Diagnosis not present

## 2017-08-12 DIAGNOSIS — Z95828 Presence of other vascular implants and grafts: Secondary | ICD-10-CM

## 2017-08-12 DIAGNOSIS — C184 Malignant neoplasm of transverse colon: Secondary | ICD-10-CM

## 2017-08-12 MED ORDER — SODIUM CHLORIDE 0.9% FLUSH
10.0000 mL | INTRAVENOUS | Status: DC | PRN
  Administered 2017-08-12: 10 mL
  Filled 2017-08-12: qty 10

## 2017-08-12 MED ORDER — HEPARIN SOD (PORK) LOCK FLUSH 100 UNIT/ML IV SOLN
500.0000 [IU] | Freq: Once | INTRAVENOUS | Status: AC | PRN
  Administered 2017-08-12: 500 [IU]

## 2017-08-12 NOTE — Progress Notes (Signed)
Kevin Kirk presents to have home infusion pump d/c'd and for port-a-cath deaccess with flush.  Portacath located left chest wall accessed with  H 20 needle.  Good blood return present. Portacath flushed with NS 10 ml and 500U/61ml Heparin, and needle removed intact.  Procedure tolerated well and without incident.  Discharged ambulatory.

## 2017-08-24 ENCOUNTER — Encounter (HOSPITAL_COMMUNITY): Payer: Self-pay | Admitting: Internal Medicine

## 2017-08-24 ENCOUNTER — Inpatient Hospital Stay (HOSPITAL_COMMUNITY): Payer: Medicare Other

## 2017-08-24 ENCOUNTER — Inpatient Hospital Stay (HOSPITAL_COMMUNITY): Payer: Medicare Other | Attending: Hematology | Admitting: Internal Medicine

## 2017-08-24 VITALS — BP 139/60 | HR 56 | Temp 97.9°F | Resp 18 | Wt 201.7 lb

## 2017-08-24 VITALS — BP 105/51 | HR 64 | Temp 97.9°F | Resp 18 | Wt 201.7 lb

## 2017-08-24 DIAGNOSIS — C184 Malignant neoplasm of transverse colon: Secondary | ICD-10-CM | POA: Diagnosis not present

## 2017-08-24 DIAGNOSIS — D509 Iron deficiency anemia, unspecified: Secondary | ICD-10-CM

## 2017-08-24 DIAGNOSIS — R197 Diarrhea, unspecified: Secondary | ICD-10-CM

## 2017-08-24 DIAGNOSIS — Z5111 Encounter for antineoplastic chemotherapy: Secondary | ICD-10-CM | POA: Insufficient documentation

## 2017-08-24 DIAGNOSIS — I10 Essential (primary) hypertension: Secondary | ICD-10-CM | POA: Diagnosis not present

## 2017-08-24 DIAGNOSIS — Z79899 Other long term (current) drug therapy: Secondary | ICD-10-CM

## 2017-08-24 DIAGNOSIS — G629 Polyneuropathy, unspecified: Secondary | ICD-10-CM

## 2017-08-24 DIAGNOSIS — Z95828 Presence of other vascular implants and grafts: Secondary | ICD-10-CM

## 2017-08-24 LAB — COMPREHENSIVE METABOLIC PANEL
ALT: 24 U/L (ref 17–63)
AST: 23 U/L (ref 15–41)
Albumin: 3.8 g/dL (ref 3.5–5.0)
Alkaline Phosphatase: 48 U/L (ref 38–126)
Anion gap: 7 (ref 5–15)
BUN: 12 mg/dL (ref 6–20)
CO2: 30 mmol/L (ref 22–32)
Calcium: 9.4 mg/dL (ref 8.9–10.3)
Chloride: 101 mmol/L (ref 101–111)
Creatinine, Ser: 0.89 mg/dL (ref 0.61–1.24)
GFR calc Af Amer: >60 mL/min (ref >60)
GFR calc non Af Amer: >60 mL/min (ref >60)
Glucose, Bld: 158 mg/dL — ABNORMAL HIGH (ref 65–99)
Potassium: 4.4 mmol/L (ref 3.5–5.1)
Sodium: 138 mmol/L (ref 135–145)
Total Bilirubin: 0.7 mg/dL (ref 0.3–1.2)
Total Protein: 7.1 g/dL (ref 6.5–8.1)

## 2017-08-24 LAB — URINALYSIS, DIPSTICK ONLY
Bilirubin Urine: NEGATIVE
Glucose, UA: NEGATIVE mg/dL
Hgb urine dipstick: NEGATIVE
Ketones, ur: NEGATIVE mg/dL
Leukocytes, UA: NEGATIVE
Nitrite: NEGATIVE
Protein, ur: NEGATIVE mg/dL
Specific Gravity, Urine: 1.018 (ref 1.005–1.030)
pH: 5 (ref 5.0–8.0)

## 2017-08-24 LAB — CBC WITH DIFFERENTIAL/PLATELET
Basophils Absolute: 0 10*3/uL (ref 0.0–0.1)
Basophils Relative: 0 %
Eosinophils Absolute: 0.1 10*3/uL (ref 0.0–0.7)
Eosinophils Relative: 2 %
HCT: 37.4 % — ABNORMAL LOW (ref 39.0–52.0)
Hemoglobin: 11.8 g/dL — ABNORMAL LOW (ref 13.0–17.0)
Lymphocytes Relative: 50 %
Lymphs Abs: 2.9 10*3/uL (ref 0.7–4.0)
MCH: 31.7 pg (ref 26.0–34.0)
MCHC: 31.6 g/dL (ref 30.0–36.0)
MCV: 100.5 fL — ABNORMAL HIGH (ref 78.0–100.0)
Monocytes Absolute: 0.9 10*3/uL (ref 0.1–1.0)
Monocytes Relative: 15 %
Neutro Abs: 2 10*3/uL (ref 1.7–7.7)
Neutrophils Relative %: 33 %
Platelets: 198 10*3/uL (ref 150–400)
RBC: 3.72 MIL/uL — ABNORMAL LOW (ref 4.22–5.81)
RDW: 13.8 % (ref 11.5–15.5)
WBC: 6 10*3/uL (ref 4.0–10.5)

## 2017-08-24 LAB — LACTATE DEHYDROGENASE: LDH: 150 U/L (ref 98–192)

## 2017-08-24 MED ORDER — SODIUM CHLORIDE 0.9 % IV SOLN: 10.0000 mg | Freq: Once | INTRAVENOUS | Status: DC

## 2017-08-24 MED ORDER — PALONOSETRON HCL INJECTION 0.25 MG/5ML
0.2500 mg | Freq: Once | INTRAVENOUS | Status: AC
  Administered 2017-08-24: 0.25 mg via INTRAVENOUS

## 2017-08-24 MED ORDER — SODIUM CHLORIDE 0.9 % IV SOLN
Freq: Once | INTRAVENOUS | Status: AC
  Administered 2017-08-24: 10:00:00 via INTRAVENOUS

## 2017-08-24 MED ORDER — ATROPINE SULFATE 1 MG/ML IJ SOLN
0.5000 mg | Freq: Once | INTRAMUSCULAR | Status: AC
  Administered 2017-08-24: 0.5 mg via INTRAVENOUS

## 2017-08-24 MED ORDER — DEXAMETHASONE SODIUM PHOSPHATE 10 MG/ML IJ SOLN
10.0000 mg | Freq: Once | INTRAMUSCULAR | Status: AC
  Administered 2017-08-24: 10 mg via INTRAVENOUS

## 2017-08-24 MED ORDER — DEXAMETHASONE SODIUM PHOSPHATE 10 MG/ML IJ SOLN
INTRAMUSCULAR | Status: AC
  Filled 2017-08-24: qty 1

## 2017-08-24 MED ORDER — ATROPINE SULFATE 1 MG/ML IJ SOLN
INTRAMUSCULAR | Status: AC
  Filled 2017-08-24: qty 1

## 2017-08-24 MED ORDER — SODIUM CHLORIDE 0.9 % IV SOLN
5.0000 mg/kg | Freq: Once | INTRAVENOUS | Status: AC
  Administered 2017-08-24: 450 mg via INTRAVENOUS
  Filled 2017-08-24: qty 2

## 2017-08-24 MED ORDER — PALONOSETRON HCL INJECTION 0.25 MG/5ML
INTRAVENOUS | Status: AC
  Filled 2017-08-24: qty 5

## 2017-08-24 MED ORDER — LEUCOVORIN CALCIUM INJECTION 350 MG
800.0000 mg | Freq: Once | INTRAMUSCULAR | Status: AC
  Administered 2017-08-24: 800 mg via INTRAVENOUS
  Filled 2017-08-24: qty 5

## 2017-08-24 MED ORDER — IRINOTECAN HCL CHEMO INJECTION 100 MG/5ML
180.0000 mg/m2 | Freq: Once | INTRAVENOUS | Status: AC
  Administered 2017-08-24: 400 mg via INTRAVENOUS
  Filled 2017-08-24: qty 5

## 2017-08-24 MED ORDER — FLUOROURACIL CHEMO INJECTION 2.5 GM/50ML
400.0000 mg/m2 | Freq: Once | INTRAVENOUS | Status: AC
  Administered 2017-08-24: 850 mg via INTRAVENOUS
  Filled 2017-08-24: qty 17

## 2017-08-24 MED ORDER — SODIUM CHLORIDE 0.9 % IV SOLN
2400.0000 mg/m2 | INTRAVENOUS | Status: DC
  Administered 2017-08-24: 5250 mg via INTRAVENOUS
  Filled 2017-08-24: qty 100

## 2017-08-24 NOTE — Progress Notes (Signed)
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[place]) - Plan: CBC with Differential/Platelet, Comprehensive metabolic panel, Lactate dehydrogenase, Urinalysis, dipstick only, CT CHEST W CONTRAST, CT Abdomen Pelvis W Contrast, DISCONTINUED: 0.9 %  sodium chloride infusion, DISCONTINUED: atropine injection 0.5 mg, DISCONTINUED: palonosetron (ALOXI) injection 0.25 mg, DISCONTINUED: dexamethasone (DECADRON) 10 mg in sodium chloride 0.9 % 50 mL IVPB, DISCONTINUED: bevacizumab (AVASTIN) 450 mg in sodium chloride 0.9 % 100 mL chemo infusion, DISCONTINUED: irinotecan (CAMPTOSAR) 400 mg in dextrose 5 % 500 mL chemo infusion, DISCONTINUED: leucovorin 872 mg in dextrose 5 % 250 mL infusion, DISCONTINUED: fluorouracil (ADRUCIL) chemo injection 850 mg, DISCONTINUED: fluorouracil (ADRUCIL) 5,250 mg in sodium chloride 0.9 % 145 mL chemo infusion  Staging Cancer Staging 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HCC) Staging form: Colon and Rectum, AJCC 8th Edition - Clinical: cT3, cN1a, cM1 - Signed by Twana First, MD on 03/09/2017 - Pathologic: No stage assigned - Unsigned   Assessment and Plan:  1.  Stage IV colon cancer (pT3 pN1 M1) MSI -High: -K-ras mutation negative, BRAF V600E positive, PIK3CA, CDKN2A,TP53  84 y.o. Male on immunotherapy with nivolumab for diagnosis of colon cancer under the direction of Dr Lebron Conners.  He had been on Nivo since 02/23/2017.    [place] KitchenPET scan done 07/20/2017 shows  Shows IMPRESSION: 1. Increase in size and metabolic activity of the anterior left upper quadrant omental metastatic lesion.  Lesion went from 2.5 x 1.8 cm  to 4.1 x 4.1 cm with SUV 14.7 previously 10.6.   2. Nonspecific low-grade metabolic activity in several small left hilar lymph nodes, with activity mildly increased from 04/17/2017 but relatively similar to 11/20 09/2016. I am uncertain whether these are reactive or neoplastic. 3. Other imaging findings of potential clinical significance: Aortic Atherosclerosis  (ICD10-I70.0). Chronic maxillary sinusitis. Coronary atherosclerosis. Ascending thoracic aortic aneurysm measured 4.4 cm diameter. Hyperdense stable left kidney upper pole lesion, likely a complex cyst.  Based on evidence of progression on recent Pet, he was presented options of therapy with Folfiri and Avastin.   Pt will be treated with 63f 400 mg/m2 with 5 fu CIVI 2400 mg/m2 over 46 hours, Leucovorin 400 mg/m2, Irinotecan 180 mg/m2 with Avastin 5 mg/kg  Every 2 weeks.  Side effects of the medication were reviewed with pt including primarily bleeding and diarrhea.   Pt is seen today for evaluation prior to C3 of therapy.  Labs done 08/24/2017 reviewed with pt and show WBC 6 HB 11.8 and plts 198.  UA negative for blood or protein.  He will continue Treatment every 2 weeks and will be set up for imaging July 29,2019 and will follow-up at that time to go over scan results.    2.  Diarrhea.  He reports this improved with antidiarrheal medications.  He is advised to notify the office if symptoms recur.  Labs and electrolytes WNL on blood work done 08/24/2017.  3.  IDA.  Patient has been treated with IV iron in the past.  Labs done 08/24/2017 show Hemoglobin is 11.8.   Will continue to monitor counts as therapy proceeds.  4.  Neuropathy.  We will continue to monitor this as therapy proceeds.  5.  Hypertension.  BP is  105/51.  Follow-up with PCP.    6.  Family history of GI cancers.  He reports this occurred in his brother.  Will discuss with genetic counselor as He has MSI High, BRAF + tumor.    Interval History:   84y.o. On nivolumab for diagnosis of recurrent colon  cancer under the direction of Dr Lebron Conners.    Current Status:  Pt is seen today for follow-up prior to C3 of Folfiri and Avastin.  He reported diarrhea that improved with medication.      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[place])   07/11/2016 Initial Diagnosis    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[place])      07/26/2017 -   Chemotherapy    The patient had palonosetron (ALOXI) injection 0.25 mg, 0.25 mg, Intravenous,  Once, 3 of 4 cycles Administration: 0.25 mg (07/27/2017), 0.25 mg (08/10/2017) bevacizumab (AVASTIN) 450 mg in sodium chloride 0.9 % 100 mL chemo infusion, 5 mg/kg = 450 mg, Intravenous,  Once, 3 of 4 cycles Administration: 450 mg (07/27/2017), 450 mg (08/10/2017) irinotecan (CAMPTOSAR) 400 mg in dextrose 5 % 500 mL chemo infusion, 180 mg/m2 = 400 mg, Intravenous,  Once, 3 of 4 cycles Administration: 400 mg (07/27/2017), 400 mg (08/10/2017) leucovorin 800 mg in dextrose 5 % 250 mL infusion, 872 mg, Intravenous,  Once, 3 of 4 cycles Administration: 800 mg (07/27/2017), 800 mg (08/10/2017) fluorouracil (ADRUCIL) chemo injection 850 mg, 400 mg/m2 = 850 mg, Intravenous,  Once, 3 of 4 cycles Administration: 850 mg (07/27/2017), 850 mg (08/10/2017) fluorouracil (ADRUCIL) 5,250 mg in sodium chloride 0.9 % 145 mL chemo infusion, 2,400 mg/m2 = 5,250 mg, Intravenous, 1 Day/Dose, 3 of 4 cycles Administration: 5,250 mg (07/27/2017), 5,250 mg (08/10/2017)  for chemotherapy treatment.         Problem List Patient Active Problem List   Diagnosis Date Noted  . Port-A-Cath in place [facility] 05/18/2017  . Iron deficiency anemia [D50.9] 09/07/2016  . S/P partial colectomy [Z90.49] 07/11/2016  . 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[place]) [C18.4]   . High cholesterol [E78.00] 04/22/2016  . H/O aortic valve replacement [Z95.2] 04/22/2016  . Diabetes ([place]) [E11.9] 04/22/2016  . Chronic pulmonary embolism ([place]) [I27.82] 04/22/2016  . Rectal bleeding [K62.5] 04/22/2016    Past Medical History Past Medical History:  Diagnosis Date  . Chronic pulmonary embolism ([place]) 93/7342   compliction of the valve replacement surgery  . COPD (chronic obstructive pulmonary disease) ([place])   . Diabetes ([place]) 04/22/2016  . H/O aortic valve replacement 10/2008  . Hypercholesteremia   . Hypertension   . Pulmonary emboli ([place]) 10/2008  .  Sleep apnea     Past Surgical History Past Surgical History:  Procedure Laterality Date  . AORTIC VALVE REPLACEMENT     2010  . CARDIAC CATHETERIZATION  2010  . COLONOSCOPY N/A 06/05/2016   Procedure: COLONOSCOPY;  Surgeon: Rogene Houston, MD;  Location: [facility] END[facility] SU[facility];  Service: Endoscopy;  Laterality: N/A;  . PARTIAL COLECTOMY N/A 07/11/2016   Procedure: PARTIAL COLECTOMY;  Surgeon: Aviva Signs, MD;  Location: [facility] ORS;  Service: General;  Laterality: N/A;  . PORTACATH PLACEMENT N/A 08/13/2016   Procedure: INSERTION PORT-A-CATH LEFT SUBCLAVIAN;  Surgeon: Aviva Signs, MD;  Location: [facility] ORS;  Service: General;  Laterality: N/A;  . REPLACEMENT TOTAL KNEE     1996  . spenectomy     from trauma    Family History Family History  Problem Relation Age of Onset  . Colon cancer Neg Hx      Social History  reports that he quit smoking about 55 years ago. His smoking use included cigarettes. He quit after 15.00 years of use. He has never used smokeless tobacco. He reports that he does not drink alcohol or use drugs.  Medications  Current Outpatient Medications:  .  aspirin EC 81 MG tablet, Take 81 mg by mouth daily., Disp: , Rfl:  .  atorvastatin (LIPITOR) 10 MG tablet, Take 10 mg by mouth at bedtime. , Disp: , Rfl:  .  Cholecalciferol (VITAMIN D3) 5000 units CAPS, Take 5,000 Units by mouth daily. , Disp: , Rfl:  .  Diphenhyd-Hydrocort-Nystatin (FIRST-DUKES MOUTHWASH) SUSP, Use as directed 5 mLs in the mouth or throat 4 (four) times daily as needed., Disp: 237 mL, Rfl: 3 .  diphenoxylate-atropine (LOMOTIL) 2.5-0.025 MG tablet, Take 1 tablet by mouth 4 (four) times daily as needed for diarrhea or loose stools., Disp: 60 tablet, Rfl: 0 .  finasteride (PROSCAR) 5 MG tablet, Take 5 mg by mouth at bedtime. , Disp: , Rfl:  .  gabapentin (NEURONTIN) 600 MG tablet, Take 1 tablet (600 mg total) by mouth 3 (three) times daily., Disp: 90 tablet, Rfl: 2 .  lisinopril (PRINIVIL,ZESTRIL) 10 MG tablet,  , Disp: , Rfl:  .  lisinopril (PRINIVIL,ZESTRIL) 5 MG tablet, Take 5 mg by mouth at bedtime. , Disp: , Rfl:  .  metFORMIN (GLUCOPHAGE) 500 MG tablet, Take 500 mg by mouth 2 (two) times daily., Disp: , Rfl:  .  metoprolol (LOPRESSOR) 50 MG tablet, Take 50 mg by mouth 2 (two) times daily., Disp: , Rfl:  .  vitamin B-12 (CYANOCOBALAMIN) 1000 MCG tablet, Take 1,000 mcg by mouth daily. , Disp: , Rfl:  No current facility-administered medications for this visit.   Facility-Administered Medications Ordered in Other Visits:  .  bevacizumab (AVASTIN) 450 mg in sodium chloride 0.9 % 100 mL chemo infusion, 5 mg/kg (Order-Specific), Intravenous, Once, Prentiss Polio, MD, Last Rate: 708 mL/hr at 08/24/17 1115, 450 mg at 08/24/17 1115 .  fluorouracil (ADRUCIL) 5,250 mg in sodium chloride 0.9 % 145 mL chemo infusion, 2,400 mg/m2 (Order-Specific), Intravenous, 1 day or 1 dose, Glen Blatchley, MD .  fluorouracil (ADRUCIL) chemo injection 850 mg, 400 mg/m2 (Order-Specific), Intravenous, Once, Zoltan Genest, Mathis Dad, MD .  irinotecan (CAMPTOSAR) 400 mg in dextrose 5 % 500 mL chemo infusion, 180 mg/m2 (Order-Specific), Intravenous, Once, Airel Magadan, MD .  leucovorin 800 mg in dextrose 5 % 250 mL infusion, 800 mg, Intravenous, Once, Stefanos Haynesworth, MD  Allergies Amoxicillin and Tamsulosin hcl  Review of Systems Review of Systems - Oncology ROS as per HPI otherwise 12 point ROS is negative.   Physical Exam  Vitals Wt Readings from Last 3 Encounters:  08/24/17 201 lb 11.2 oz (91.5 kg)  08/24/17 201 lb 11.2 oz (91.5 kg)  08/10/17 200 lb 6.4 oz (90.9 kg)   Temp Readings from Last 3 Encounters:  08/24/17 97.9 F (36.6 C) (Oral)  08/24/17 97.9 F (36.6 C) (Oral)  08/12/17 98.5 F (36.9 C) (Oral)   BP Readings from Last 3 Encounters:  08/24/17 (!) 105/51  08/24/17 (!) 105/51  08/12/17 (!) 116/56   Pulse Readings from Last 3 Encounters:  08/24/17 64  08/24/17 64  08/12/17 68    Constitutional: Well-developed,  well-nourished, and in no distress.   HENT: Head: Normocephalic and atraumatic.  Mouth/Throat: No oropharyngeal exudate. Mucosa moist. Eyes: Pupils are equal, round, and reactive to light. Conjunctivae are normal. No scleral icterus.  Neck: Normal range of motion. Neck supple. No JVD present.  Cardiovascular: Normal rate, regular rhythm and normal heart sounds.  Exam reveals no gallop and no friction rub.   No murmur heard. Pulmonary/Chest: Effort normal and breath sounds normal. No respiratory distress. No wheezes.No rales.  Abdominal: Soft. Bowel sounds are normal. No  distension. There is no tenderness. There is no guarding.  Musculoskeletal: No edema or tenderness.  Lymphadenopathy: No cervical, axillary or supraclavicular adenopathy.  Neurological: Alert and oriented to person, place, and time. No cranial nerve deficit.  Skin: Skin is warm and dry. No rash noted. No erythema. No pallor.  Psychiatric: Affect and judgment normal.   Labs Infusion on 08/24/2017  Component Date Value Ref Range Status  . Color, Urine 08/24/2017 YELLOW  YELLOW Final  . APPearance 08/24/2017 HAZY* CLEAR Final  . Specific Gravity, Urine 08/24/2017 1.018  1.005 - 1.030 Final  . pH 08/24/2017 5.0  5.0 - 8.0 Final  . Glucose, UA 08/24/2017 NEGATIVE  NEGATIVE mg/dL Final  . Hgb urine dipstick 08/24/2017 NEGATIVE  NEGATIVE Final  . Bilirubin Urine 08/24/2017 NEGATIVE  NEGATIVE Final  . Ketones, ur 08/24/2017 NEGATIVE  NEGATIVE mg/dL Final  . Protein, ur 08/24/2017 NEGATIVE  NEGATIVE mg/dL Final  . Nitrite 08/24/2017 NEGATIVE  NEGATIVE Final  . Leukocytes, UA 08/24/2017 NEGATIVE  NEGATIVE Final   Performed at [facility], [address] [place], [place] [address]  Appointment on 08/24/2017  Component Date Value Ref Range Status  . WBC 08/24/2017 6.0  4.0 - 10.5 K/uL Final  . RBC 08/24/2017 3.72* 4.22 - 5.81 MIL/uL Final  . Hemoglobin 08/24/2017 11.8* 13.0 - 17.0 g/dL Final  . HCT 08/24/2017 37.4* 39.0 - 52.0  % Final  . MCV 08/24/2017 100.5* 78.0 - 100.0 fL Final  . MCH 08/24/2017 31.7  26.0 - 34.0 pg Final  . MCHC 08/24/2017 31.6  30.0 - 36.0 g/dL Final  . RDW 08/24/2017 13.8  11.5 - 15.5 % Final  . Platelets 08/24/2017 198  150 - 400 K/uL Final  . Neutrophils Relative % 08/24/2017 33  % Final  . Neutro Abs 08/24/2017 2.0  1.7 - 7.7 K/uL Final  . Lymphocytes Relative 08/24/2017 50  % Final  . Lymphs Abs 08/24/2017 2.9  0.7 - 4.0 K/uL Final  . Monocytes Relative 08/24/2017 15  % Final  . Monocytes Absolute 08/24/2017 0.9  0.1 - 1.0 K/uL Final  . Eosinophils Relative 08/24/2017 2  % Final  . Eosinophils Absolute 08/24/2017 0.1  0.0 - 0.7 K/uL Final  . Basophils Relative 08/24/2017 0  % Final  . Basophils Absolute 08/24/2017 0.0  0.0 - 0.1 K/uL Final   Performed at [facility], [address] [place], [place] [address]  . Sodium 08/24/2017 138  135 - 145 mmol/L Final  . Potassium 08/24/2017 4.4  3.5 - 5.1 mmol/L Final  . Chloride 08/24/2017 101  101 - 111 mmol/L Final  . CO2 08/24/2017 30  22 - 32 mmol/L Final  . Glucose, Bld 08/24/2017 158* 65 - 99 mg/dL Final  . BUN 08/24/2017 12  6 - 20 mg/dL Final  . Creatinine, Ser 08/24/2017 0.89  0.61 - 1.24 mg/dL Final  . Calcium 08/24/2017 9.4  8.9 - 10.3 mg/dL Final  . Total Protein 08/24/2017 7.1  6.5 - 8.1 g/dL Final  . Albumin 08/24/2017 3.8  3.5 - 5.0 g/dL Final  . AST 08/24/2017 23  15 - 41 U/L Final  . ALT 08/24/2017 24  17 - 63 U/L Final  . Alkaline Phosphatase 08/24/2017 48  38 - 126 U/L Final  . Total Bilirubin 08/24/2017 0.7  0.3 - 1.2 mg/dL Final  . GFR calc non Af Amer 08/24/2017 >60  >60 mL/min Final  . GFR calc Af Amer 08/24/2017 >60  >60 mL/min Final   Comment: (NOTE)  The eGFR has been calculated using the CKD EPI equation. This calculation has not been validated in all clinical situations. eGFR's persistently <60 mL/min signify possible Chronic Kidney Disease.   Georgiann Hahn gap 08/24/2017 7  5 - 15 Final   Performed at [facility], [address] [place], [place] [address]  . LDH 08/24/2017 150  98 - 192 U/L Final   Performed at [facility], [address], [place], [place] [address]     Pathology Orders Placed This Encounter  Procedures  . CT CHEST W CONTRAST    Standing Status:   Future    Standing Expiration Date:   08/24/2018    Order Specific Question:   If indicated for the ordered procedure, I authorize the administration of contrast media per Radiology protocol    Answer:   Yes    Order Specific Question:   Preferred imaging location?    Answer:   [facility]    Order Specific Question:   Radiology Contrast Protocol - do NOT remove file path    Answer:   \\[email or web address]  . CT Abdomen Pelvis W Contrast    Standing Status:   Future    Standing Expiration Date:   08/24/2018    Order Specific Question:   If indicated for the ordered procedure, I authorize the administration of contrast media per Radiology protocol    Answer:   Yes    Order Specific Question:   Preferred imaging location?    Answer:   [facility]    Order Specific Question:   Is Oral Contrast requested for this exam?    Answer:   Yes, Per Radiology protocol    Order Specific Question:   Radiology Contrast Protocol - do NOT remove file path    Answer:   \\[email or web address]  . CBC with Differential/Platelet    Standing Status:   Future    Standing Expiration Date:   08/25/2018  . Comprehensive metabolic panel    Standing Status:   Future    Standing Expiration Date:   08/25/2018  . Lactate dehydrogenase    Standing Status:   Future    Standing Expiration Date:   08/25/2018  . Urinalysis, dipstick only    Standing Status:   Future    Standing Expiration Date:   08/25/2018       Zoila Shutter MD

## 2017-08-24 NOTE — Progress Notes (Signed)
Tolerated infusions w/o adverse reaction.  Alert, in no distress.  VSS.  Discharged ambulatory in c/o friend with home ambulatory pump infusing as ordered.

## 2017-08-24 NOTE — Patient Instructions (Signed)
[facility] Discharge Instructions  You say Dr. Higgs today.   Thank you for choosing [facility] at [facility] to provide your oncology and hematology care.  To afford each patient quality time with our provider, please arrive at least 15 minutes before your scheduled appointment time.   If you have a lab appointment with the Cancer Center please come in thru the  Main Entrance and check in at the main information desk  You need to re-schedule your appointment should you arrive 10 or more minutes late.  We strive to give you quality time with our providers, and arriving late affects you and other patients whose appointments are after yours.  Also, if you no show three or more times for appointments you may be dismissed from the clinic at the providers discretion.     Again, thank you for choosing [facility].  Our hope is that these requests will decrease the amount of time that you wait before being seen by our physicians.       _____________________________________________________________  Should you have questions after your visit to [facility], please contact our office at [phone] between the hours of 8:30 a.m. and 4:30 p.m.  Voicemails left after 4:30 p.m. will not be returned until the following business day.  For prescription refill requests, have your pharmacy contact our office.       Resources For Cancer Patients and their Caregivers ? American Cancer Society: Can assist with transportation, wigs, general needs, runs Look Good Feel Better.        1-888-227-6333 ? Cancer Care: Provides financial assistance, online support groups, medication/co-pay assistance.  1-800-813-HOPE (4673) ? [facility] Assists [place] cancer patients and their families through emotional , educational and financial support.  336-427-4357 ? [place] DSS Where to apply for food  stamps, Medicaid and utility assistance. 336-342-1394 ? RCATS: Transportation to medical appointments. 336-347-2287 ? Social Security Administration: May apply for disability if have a Stage IV cancer. 336-342-7796 1-800-772-1213 ? [place] Aging, Disability and Transit Services: Assists with nutrition, care and transit needs. 336-349-2343  Cancer Center Support Programs:   > Cancer Support Group  2nd Tuesday of the month 1pm-2pm, Journey Room   > Creative Journey  3rd Tuesday of the month 1130am-1pm, Journey Room     

## 2017-08-25 LAB — CEA: CEA: 5 ng/mL — ABNORMAL HIGH (ref 0.0–4.7)

## 2017-08-26 ENCOUNTER — Other Ambulatory Visit: Payer: Self-pay

## 2017-08-26 ENCOUNTER — Encounter (HOSPITAL_COMMUNITY): Payer: Self-pay

## 2017-08-26 ENCOUNTER — Inpatient Hospital Stay (HOSPITAL_COMMUNITY): Payer: Medicare Other

## 2017-08-26 VITALS — BP 124/59 | HR 79 | Temp 98.3°F | Resp 18

## 2017-08-26 DIAGNOSIS — Z95828 Presence of other vascular implants and grafts: Secondary | ICD-10-CM

## 2017-08-26 DIAGNOSIS — C184 Malignant neoplasm of transverse colon: Secondary | ICD-10-CM

## 2017-08-26 DIAGNOSIS — Z5111 Encounter for antineoplastic chemotherapy: Secondary | ICD-10-CM | POA: Diagnosis not present

## 2017-08-26 MED ORDER — SODIUM CHLORIDE 0.9% FLUSH
10.0000 mL | INTRAVENOUS | Status: DC | PRN
  Administered 2017-08-26: 10 mL
  Filled 2017-08-26: qty 10

## 2017-08-26 MED ORDER — HEPARIN SOD (PORK) LOCK FLUSH 100 UNIT/ML IV SOLN
500.0000 [IU] | Freq: Once | INTRAVENOUS | Status: AC | PRN
  Administered 2017-08-26: 500 [IU]

## 2017-08-26 NOTE — Progress Notes (Signed)
Kevin Kirk presents to have home infusion pump d/c'd and for port-a-cath deaccess with flush.  Portacath located left chest wall accessed with  H 20 needle.  Good blood return present. Portacath flushed with NS and 500U/5ml Heparin, and needle removed intact.  Procedure tolerated well and without incident.  Discharged ambulatory.  

## 2017-09-07 ENCOUNTER — Inpatient Hospital Stay (HOSPITAL_COMMUNITY): Payer: Medicare Other

## 2017-09-07 ENCOUNTER — Other Ambulatory Visit: Payer: Self-pay

## 2017-09-07 VITALS — BP 100/54 | HR 55 | Temp 97.9°F | Resp 16 | Wt 201.8 lb

## 2017-09-07 DIAGNOSIS — Z95828 Presence of other vascular implants and grafts: Secondary | ICD-10-CM

## 2017-09-07 DIAGNOSIS — C184 Malignant neoplasm of transverse colon: Secondary | ICD-10-CM

## 2017-09-07 DIAGNOSIS — Z5111 Encounter for antineoplastic chemotherapy: Secondary | ICD-10-CM | POA: Diagnosis not present

## 2017-09-07 LAB — CBC WITH DIFFERENTIAL/PLATELET
Basophils Absolute: 0 10*3/uL (ref 0.0–0.1)
Basophils Relative: 1 %
Eosinophils Absolute: 0.2 10*3/uL (ref 0.0–0.7)
Eosinophils Relative: 3 %
HCT: 36.7 % — ABNORMAL LOW (ref 39.0–52.0)
Hemoglobin: 11.7 g/dL — ABNORMAL LOW (ref 13.0–17.0)
Lymphocytes Relative: 38 %
Lymphs Abs: 2.3 10*3/uL (ref 0.7–4.0)
MCH: 32.6 pg (ref 26.0–34.0)
MCHC: 31.9 g/dL (ref 30.0–36.0)
MCV: 102.2 fL — ABNORMAL HIGH (ref 78.0–100.0)
Monocytes Absolute: 1.3 10*3/uL — ABNORMAL HIGH (ref 0.1–1.0)
Monocytes Relative: 21 %
Neutro Abs: 2.2 10*3/uL (ref 1.7–7.7)
Neutrophils Relative %: 37 %
Platelets: 211 10*3/uL (ref 150–400)
RBC: 3.59 MIL/uL — ABNORMAL LOW (ref 4.22–5.81)
RDW: 14.8 % (ref 11.5–15.5)
WBC: 6 10*3/uL (ref 4.0–10.5)

## 2017-09-07 LAB — URINALYSIS, DIPSTICK ONLY
Bilirubin Urine: NEGATIVE
Glucose, UA: NEGATIVE mg/dL
Hgb urine dipstick: NEGATIVE
Ketones, ur: NEGATIVE mg/dL
Leukocytes, UA: NEGATIVE
Nitrite: NEGATIVE
Protein, ur: NEGATIVE mg/dL
Specific Gravity, Urine: 1.019 (ref 1.005–1.030)
pH: 5 (ref 5.0–8.0)

## 2017-09-07 LAB — COMPREHENSIVE METABOLIC PANEL
ALT: 19 U/L (ref 17–63)
AST: 23 U/L (ref 15–41)
Albumin: 3.4 g/dL — ABNORMAL LOW (ref 3.5–5.0)
Alkaline Phosphatase: 41 U/L (ref 38–126)
Anion gap: 9 (ref 5–15)
BUN: 14 mg/dL (ref 6–20)
CO2: 27 mmol/L (ref 22–32)
Calcium: 9 mg/dL (ref 8.9–10.3)
Chloride: 102 mmol/L (ref 101–111)
Creatinine, Ser: 0.99 mg/dL (ref 0.61–1.24)
GFR calc Af Amer: >60 mL/min (ref >60)
GFR calc non Af Amer: >60 mL/min (ref >60)
Glucose, Bld: 193 mg/dL — ABNORMAL HIGH (ref 65–99)
Potassium: 4.9 mmol/L (ref 3.5–5.1)
Sodium: 138 mmol/L (ref 135–145)
Total Bilirubin: 0.7 mg/dL (ref 0.3–1.2)
Total Protein: 6.7 g/dL (ref 6.5–8.1)

## 2017-09-07 MED ORDER — SODIUM CHLORIDE 0.9% FLUSH
10.0000 mL | INTRAVENOUS | Status: DC | PRN
  Administered 2017-09-07: 10 mL
  Filled 2017-09-07: qty 10

## 2017-09-07 MED ORDER — SODIUM CHLORIDE 0.9 % IV SOLN
Freq: Once | INTRAVENOUS | Status: AC
  Administered 2017-09-07: 09:00:00 via INTRAVENOUS

## 2017-09-07 MED ORDER — SODIUM CHLORIDE 0.9 % IV SOLN
2400.0000 mg/m2 | INTRAVENOUS | Status: DC
  Administered 2017-09-07: 5250 mg via INTRAVENOUS
  Filled 2017-09-07: qty 100

## 2017-09-07 MED ORDER — SODIUM CHLORIDE 0.9 % IV SOLN
5.0000 mg/kg | Freq: Once | INTRAVENOUS | Status: AC
  Administered 2017-09-07: 450 mg via INTRAVENOUS
  Filled 2017-09-07: qty 16

## 2017-09-07 MED ORDER — IRINOTECAN HCL CHEMO INJECTION 100 MG/5ML
180.0000 mg/m2 | Freq: Once | INTRAVENOUS | Status: AC
  Administered 2017-09-07: 400 mg via INTRAVENOUS
  Filled 2017-09-07: qty 15

## 2017-09-07 MED ORDER — ATROPINE SULFATE 1 MG/ML IJ SOLN
0.5000 mg | Freq: Once | INTRAMUSCULAR | Status: AC
  Administered 2017-09-07: 0.5 mg via INTRAVENOUS
  Filled 2017-09-07: qty 1

## 2017-09-07 MED ORDER — SODIUM CHLORIDE 0.9 % IV SOLN: 10.0000 mg | Freq: Once | INTRAVENOUS | Status: DC

## 2017-09-07 MED ORDER — PALONOSETRON HCL INJECTION 0.25 MG/5ML
0.2500 mg | Freq: Once | INTRAVENOUS | Status: AC
  Administered 2017-09-07: 0.25 mg via INTRAVENOUS
  Filled 2017-09-07: qty 5

## 2017-09-07 MED ORDER — FLUOROURACIL CHEMO INJECTION 2.5 GM/50ML
400.0000 mg/m2 | Freq: Once | INTRAVENOUS | Status: AC
  Administered 2017-09-07: 850 mg via INTRAVENOUS
  Filled 2017-09-07: qty 17

## 2017-09-07 MED ORDER — DEXAMETHASONE SODIUM PHOSPHATE 10 MG/ML IJ SOLN
10.0000 mg | Freq: Once | INTRAMUSCULAR | Status: AC
  Administered 2017-09-07: 10 mg via INTRAVENOUS
  Filled 2017-09-07: qty 1

## 2017-09-07 MED ORDER — LEUCOVORIN CALCIUM INJECTION 350 MG
800.0000 mg | Freq: Once | INTRAVENOUS | Status: AC
  Administered 2017-09-07: 800 mg via INTRAVENOUS
  Filled 2017-09-07: qty 5

## 2017-09-07 NOTE — Progress Notes (Signed)
Labs reviewed by Dr. Walden Field , proceed with treatment.  Continuous 5FU pump connected today.   Treatment given per orders. Patient tolerated it well without problems. Vitals stable and discharged home from clinic ambulatory. Follow up as scheduled.

## 2017-09-07 NOTE — Patient Instructions (Signed)
[facility] Discharge Instructions for Patients Receiving Chemotherapy   Beginning January 23rd 2017 lab work for the Cancer Center will be done in the  Main lab at [facility] on 1st floor. If you have a lab appointment with the Cancer Center please come in thru the  Main Entrance and check in at the main information desk   Today you received the following chemotherapy agents   To help prevent nausea and vomiting after your treatment, we encourage you to take your nausea medication     If you develop nausea and vomiting, or diarrhea that is not controlled by your medication, call the clinic.  The clinic phone number is [phone]. Office hours are Monday-Friday 8:30am-5:00pm.  BELOW ARE SYMPTOMS THAT SHOULD BE REPORTED IMMEDIATELY:  *FEVER GREATER THAN 101.0 F  *CHILLS WITH OR WITHOUT FEVER  NAUSEA AND VOMITING THAT IS NOT CONTROLLED WITH YOUR NAUSEA MEDICATION  *UNUSUAL SHORTNESS OF BREATH  *UNUSUAL BRUISING OR BLEEDING  TENDERNESS IN MOUTH AND THROAT WITH OR WITHOUT PRESENCE OF ULCERS  *URINARY PROBLEMS  *BOWEL PROBLEMS  UNUSUAL RASH Items with * indicate a potential emergency and should be followed up as soon as possible. If you have an emergency after office hours please contact your primary care physician or go to the nearest emergency department.  Please call the clinic during office hours if you have any questions or concerns.   You may also contact the Patient Navigator at [phone] should you have any questions or need assistance in obtaining follow up care.      Resources For Cancer Patients and their Caregivers ? American Cancer Society: Can assist with transportation, wigs, general needs, runs Look Good Feel Better.        1-888-227-6333 ? Cancer Care: Provides financial assistance, online support groups, medication/co-pay assistance.  1-800-813-HOPE (4673) ? [facility] Assists [place] cancer  patients and their families through emotional , educational and financial support.  336-427-4357 ? [place] DSS Where to apply for food stamps, Medicaid and utility assistance. 336-342-1394 ? RCATS: Transportation to medical appointments. 336-347-2287 ? Social Security Administration: May apply for disability if have a Stage IV cancer. 336-342-7796 1-800-772-1213 ? [place] Aging, Disability and Transit Services: Assists with nutrition, care and transit needs. 336-349-2343         

## 2017-09-09 ENCOUNTER — Inpatient Hospital Stay (HOSPITAL_COMMUNITY): Payer: Medicare Other | Attending: Hematology

## 2017-09-09 ENCOUNTER — Encounter (HOSPITAL_COMMUNITY): Payer: Self-pay

## 2017-09-09 ENCOUNTER — Other Ambulatory Visit: Payer: Self-pay

## 2017-09-09 VITALS — BP 119/63 | HR 68 | Temp 98.0°F | Resp 18

## 2017-09-09 DIAGNOSIS — C184 Malignant neoplasm of transverse colon: Secondary | ICD-10-CM | POA: Diagnosis present

## 2017-09-09 DIAGNOSIS — I10 Essential (primary) hypertension: Secondary | ICD-10-CM | POA: Insufficient documentation

## 2017-09-09 DIAGNOSIS — D509 Iron deficiency anemia, unspecified: Secondary | ICD-10-CM | POA: Insufficient documentation

## 2017-09-09 DIAGNOSIS — Z79899 Other long term (current) drug therapy: Secondary | ICD-10-CM | POA: Diagnosis not present

## 2017-09-09 DIAGNOSIS — G629 Polyneuropathy, unspecified: Secondary | ICD-10-CM | POA: Diagnosis not present

## 2017-09-09 DIAGNOSIS — R197 Diarrhea, unspecified: Secondary | ICD-10-CM | POA: Diagnosis not present

## 2017-09-09 DIAGNOSIS — Z95828 Presence of other vascular implants and grafts: Secondary | ICD-10-CM

## 2017-09-09 MED ORDER — HEPARIN SOD (PORK) LOCK FLUSH 100 UNIT/ML IV SOLN
INTRAVENOUS | Status: AC
  Filled 2017-09-09: qty 5

## 2017-09-09 MED ORDER — HEPARIN SOD (PORK) LOCK FLUSH 100 UNIT/ML IV SOLN
500.0000 [IU] | Freq: Once | INTRAVENOUS | Status: AC | PRN
  Administered 2017-09-09: 500 [IU]

## 2017-09-09 MED ORDER — SODIUM CHLORIDE 0.9% FLUSH
10.0000 mL | INTRAVENOUS | Status: DC | PRN
  Administered 2017-09-09: 10 mL
  Filled 2017-09-09: qty 10

## 2017-09-09 NOTE — Progress Notes (Signed)
Kevin Kirk presents to have home infusion pump d/c'd and for port-a-cath deaccess with flush.  Portacath located left chest wall accessed with  H 20 needle.  Good blood return present. Portacath flushed with NS and 500U/47ml Heparin, and needle removed intact.  Procedure tolerated well and without incident.  Discharged ambulatory in c/o family.

## 2017-09-17 ENCOUNTER — Ambulatory Visit (INDEPENDENT_AMBULATORY_CARE_PROVIDER_SITE_OTHER): Payer: Medicare Other | Admitting: Otolaryngology

## 2017-09-17 DIAGNOSIS — H903 Sensorineural hearing loss, bilateral: Secondary | ICD-10-CM | POA: Diagnosis not present

## 2017-09-17 DIAGNOSIS — H9313 Tinnitus, bilateral: Secondary | ICD-10-CM

## 2017-09-18 ENCOUNTER — Other Ambulatory Visit (HOSPITAL_COMMUNITY): Payer: Self-pay

## 2017-09-18 DIAGNOSIS — C184 Malignant neoplasm of transverse colon: Secondary | ICD-10-CM

## 2017-09-21 ENCOUNTER — Inpatient Hospital Stay (HOSPITAL_COMMUNITY): Payer: Medicare Other

## 2017-09-21 ENCOUNTER — Inpatient Hospital Stay (HOSPITAL_COMMUNITY): Payer: Medicare Other | Attending: Hematology

## 2017-09-21 ENCOUNTER — Other Ambulatory Visit: Payer: Self-pay

## 2017-09-21 ENCOUNTER — Encounter (HOSPITAL_COMMUNITY): Payer: Self-pay

## 2017-09-21 VITALS — BP 95/48 | HR 56 | Temp 97.5°F | Resp 18 | Wt 196.0 lb

## 2017-09-21 DIAGNOSIS — C184 Malignant neoplasm of transverse colon: Secondary | ICD-10-CM | POA: Insufficient documentation

## 2017-09-21 DIAGNOSIS — K521 Toxic gastroenteritis and colitis: Secondary | ICD-10-CM

## 2017-09-21 DIAGNOSIS — Z95828 Presence of other vascular implants and grafts: Secondary | ICD-10-CM

## 2017-09-21 DIAGNOSIS — Z7689 Persons encountering health services in other specified circumstances: Secondary | ICD-10-CM | POA: Insufficient documentation

## 2017-09-21 DIAGNOSIS — Z79899 Other long term (current) drug therapy: Secondary | ICD-10-CM | POA: Insufficient documentation

## 2017-09-21 DIAGNOSIS — D509 Iron deficiency anemia, unspecified: Secondary | ICD-10-CM | POA: Insufficient documentation

## 2017-09-21 DIAGNOSIS — I10 Essential (primary) hypertension: Secondary | ICD-10-CM | POA: Diagnosis not present

## 2017-09-21 DIAGNOSIS — Z5111 Encounter for antineoplastic chemotherapy: Secondary | ICD-10-CM | POA: Insufficient documentation

## 2017-09-21 DIAGNOSIS — R197 Diarrhea, unspecified: Secondary | ICD-10-CM | POA: Diagnosis not present

## 2017-09-21 DIAGNOSIS — G629 Polyneuropathy, unspecified: Secondary | ICD-10-CM | POA: Insufficient documentation

## 2017-09-21 DIAGNOSIS — T451X5A Adverse effect of antineoplastic and immunosuppressive drugs, initial encounter: Secondary | ICD-10-CM

## 2017-09-21 LAB — URINALYSIS, DIPSTICK ONLY
Bilirubin Urine: NEGATIVE
Glucose, UA: NEGATIVE mg/dL
Hgb urine dipstick: NEGATIVE
Ketones, ur: NEGATIVE mg/dL
Leukocytes, UA: NEGATIVE
Nitrite: NEGATIVE
Protein, ur: NEGATIVE mg/dL
Specific Gravity, Urine: 1.02 (ref 1.005–1.030)
pH: 5 (ref 5.0–8.0)

## 2017-09-21 LAB — COMPREHENSIVE METABOLIC PANEL
ALT: 18 U/L (ref 0–44)
AST: 23 U/L (ref 15–41)
Albumin: 3.5 g/dL (ref 3.5–5.0)
Alkaline Phosphatase: 34 U/L — ABNORMAL LOW (ref 38–126)
Anion gap: 9 (ref 5–15)
BUN: 15 mg/dL (ref 8–23)
CO2: 26 mmol/L (ref 22–32)
Calcium: 8.9 mg/dL (ref 8.9–10.3)
Chloride: 100 mmol/L (ref 98–111)
Creatinine, Ser: 1.1 mg/dL (ref 0.61–1.24)
GFR calc Af Amer: >60 mL/min (ref >60)
GFR calc non Af Amer: >60 mL/min (ref >60)
Glucose, Bld: 182 mg/dL — ABNORMAL HIGH (ref 70–99)
Potassium: 4.4 mmol/L (ref 3.5–5.1)
Sodium: 135 mmol/L (ref 135–145)
Total Bilirubin: 0.6 mg/dL (ref 0.3–1.2)
Total Protein: 7 g/dL (ref 6.5–8.1)

## 2017-09-21 LAB — CBC WITH DIFFERENTIAL/PLATELET
Basophils Absolute: 0 10*3/uL (ref 0.0–0.1)
Basophils Relative: 1 %
Eosinophils Absolute: 0.2 10*3/uL (ref 0.0–0.7)
Eosinophils Relative: 3 %
HCT: 35.9 % — ABNORMAL LOW (ref 39.0–52.0)
Hemoglobin: 11.4 g/dL — ABNORMAL LOW (ref 13.0–17.0)
Lymphocytes Relative: 34 %
Lymphs Abs: 2.1 10*3/uL (ref 0.7–4.0)
MCH: 32.7 pg (ref 26.0–34.0)
MCHC: 31.8 g/dL (ref 30.0–36.0)
MCV: 102.9 fL — ABNORMAL HIGH (ref 78.0–100.0)
Monocytes Absolute: 1.7 10*3/uL — ABNORMAL HIGH (ref 0.1–1.0)
Monocytes Relative: 28 %
Neutro Abs: 2.1 10*3/uL (ref 1.7–7.7)
Neutrophils Relative %: 34 %
Platelets: 223 10*3/uL (ref 150–400)
RBC: 3.49 MIL/uL — ABNORMAL LOW (ref 4.22–5.81)
RDW: 15.4 % (ref 11.5–15.5)
WBC: 6.1 10*3/uL (ref 4.0–10.5)

## 2017-09-21 MED ORDER — SODIUM CHLORIDE 0.9 % IV SOLN: 10.0000 mg | Freq: Once | INTRAVENOUS | Status: DC

## 2017-09-21 MED ORDER — SODIUM CHLORIDE 0.9 % IV SOLN
Freq: Once | INTRAVENOUS | Status: AC
  Administered 2017-09-21: 11:00:00 via INTRAVENOUS

## 2017-09-21 MED ORDER — ATROPINE SULFATE 1 MG/ML IJ SOLN
0.5000 mg | Freq: Once | INTRAMUSCULAR | Status: AC
  Administered 2017-09-21: 0.5 mg via INTRAVENOUS
  Filled 2017-09-21: qty 1

## 2017-09-21 MED ORDER — IRINOTECAN HCL CHEMO INJECTION 100 MG/5ML
144.0000 mg/m2 | Freq: Once | INTRAVENOUS | Status: AC
  Administered 2017-09-21: 320 mg via INTRAVENOUS
  Filled 2017-09-21: qty 15

## 2017-09-21 MED ORDER — SODIUM CHLORIDE 0.9 % IV SOLN
1920.0000 mg/m2 | INTRAVENOUS | Status: DC
  Administered 2017-09-21: 4200 mg via INTRAVENOUS
  Filled 2017-09-21: qty 84

## 2017-09-21 MED ORDER — BEVACIZUMAB CHEMO INJECTION 400 MG/16ML
5.0000 mg/kg | Freq: Once | INTRAVENOUS | Status: AC
  Administered 2017-09-21: 450 mg via INTRAVENOUS
  Filled 2017-09-21: qty 2

## 2017-09-21 MED ORDER — DEXAMETHASONE SODIUM PHOSPHATE 10 MG/ML IJ SOLN
10.0000 mg | Freq: Once | INTRAMUSCULAR | Status: AC
  Administered 2017-09-21: 10 mg via INTRAVENOUS
  Filled 2017-09-21: qty 1

## 2017-09-21 MED ORDER — PALONOSETRON HCL INJECTION 0.25 MG/5ML
0.2500 mg | Freq: Once | INTRAVENOUS | Status: AC
  Administered 2017-09-21: 0.25 mg via INTRAVENOUS
  Filled 2017-09-21: qty 5

## 2017-09-21 MED ORDER — FLUOROURACIL CHEMO INJECTION 2.5 GM/50ML
320.0000 mg/m2 | Freq: Once | INTRAVENOUS | Status: AC
  Administered 2017-09-21: 700 mg via INTRAVENOUS
  Filled 2017-09-21: qty 14

## 2017-09-21 MED ORDER — LEUCOVORIN CALCIUM INJECTION 350 MG
700.0000 mg | Freq: Once | INTRAVENOUS | Status: AC
  Administered 2017-09-21: 700 mg via INTRAVENOUS
  Filled 2017-09-21: qty 35

## 2017-09-21 MED ORDER — DIPHENOXYLATE-ATROPINE 2.5-0.025 MG PO TABS: 1.0000 | ORAL_TABLET | Freq: Four times a day (QID) | ORAL | 3 refills | Status: DC | PRN

## 2017-09-21 NOTE — Progress Notes (Signed)
Pt reports that he has experienced nausea, diarrhea, and "no energy" since his last tx.  States that he is eating, but has lost 5 lbs since his last tx.  He complains that food has no taste.  Discussed with Dr. Delton Coombes and lab results reviewed with MD.  Per MD, he will dose reduce chemotherapy given the s/s experienced by pt. Pt informed of this.   Tolerated infusions w/o adverse reaction.  Alert, in no distress.  VSS.  Discharged ambulatory in c/o friend.

## 2017-09-23 ENCOUNTER — Encounter (HOSPITAL_COMMUNITY): Payer: Self-pay

## 2017-09-23 ENCOUNTER — Inpatient Hospital Stay (HOSPITAL_COMMUNITY): Payer: Medicare Other

## 2017-09-23 VITALS — BP 118/68 | HR 57 | Temp 97.7°F | Resp 18

## 2017-09-23 DIAGNOSIS — Z5111 Encounter for antineoplastic chemotherapy: Secondary | ICD-10-CM | POA: Diagnosis not present

## 2017-09-23 DIAGNOSIS — Z95828 Presence of other vascular implants and grafts: Secondary | ICD-10-CM

## 2017-09-23 DIAGNOSIS — C184 Malignant neoplasm of transverse colon: Secondary | ICD-10-CM

## 2017-09-23 MED ORDER — HEPARIN SOD (PORK) LOCK FLUSH 100 UNIT/ML IV SOLN
500.0000 [IU] | Freq: Once | INTRAVENOUS | Status: AC | PRN
  Administered 2017-09-23: 500 [IU]

## 2017-09-23 MED ORDER — SODIUM CHLORIDE 0.9% FLUSH
10.0000 mL | INTRAVENOUS | Status: DC | PRN
  Administered 2017-09-23: 10 mL
  Filled 2017-09-23: qty 10

## 2017-09-23 NOTE — Patient Instructions (Signed)
[facility] at [facility]  Discharge Instructions:  Your chemotherapy pump was removed today.  Return as scheduled for next visit.  _______________________________________________________________  Thank you for choosing [facility] at [facility] to provide your oncology and hematology care.  To afford each patient quality time with our providers, please arrive at least 15 minutes before your scheduled appointment.  You need to re-schedule your appointment if you arrive 10 or more minutes late.  We strive to give you quality time with our providers, and arriving late affects you and other patients whose appointments are after yours.  Also, if you no show three or more times for appointments you may be dismissed from the clinic.  Again, thank you for choosing [facility] hope is that these requests will allow you access to exceptional care and in a timely manner. _______________________________________________________________  If you have questions after your visit, please contact our office at [phone] between the hours of 8:30 a.m. and 5:00 p.m. Voicemails left after 4:30 p.m. will not be returned until the following business day. _______________________________________________________________  For prescription refill requests, have your pharmacy contact our office. _______________________________________________________________  Recommendations made by the consultant and any test results will be sent to your referring physician. _______________________________________________________________

## 2017-09-23 NOTE — Progress Notes (Signed)
Patient in for chemotherapy pump disconnect.  Port site clean and dry with no bruising or swelling noted at site.  Flushed easily with good blood return noted.  No complaints of pain with flush.  Band aid applied.  VSS with discharge and left ambulatory with no s/s of distress noted.  Family at side.

## 2017-10-01 ENCOUNTER — Other Ambulatory Visit (HOSPITAL_COMMUNITY): Payer: Self-pay

## 2017-10-01 DIAGNOSIS — C184 Malignant neoplasm of transverse colon: Secondary | ICD-10-CM

## 2017-10-01 DIAGNOSIS — Z95828 Presence of other vascular implants and grafts: Secondary | ICD-10-CM

## 2017-10-01 NOTE — Progress Notes (Signed)
u

## 2017-10-05 ENCOUNTER — Inpatient Hospital Stay (HOSPITAL_COMMUNITY): Payer: Medicare Other

## 2017-10-05 ENCOUNTER — Encounter (HOSPITAL_COMMUNITY): Payer: Self-pay

## 2017-10-05 VITALS — BP 98/52 | HR 54 | Temp 97.7°F | Resp 18 | Wt 194.2 lb

## 2017-10-05 DIAGNOSIS — C184 Malignant neoplasm of transverse colon: Secondary | ICD-10-CM

## 2017-10-05 DIAGNOSIS — Z5111 Encounter for antineoplastic chemotherapy: Secondary | ICD-10-CM | POA: Diagnosis not present

## 2017-10-05 DIAGNOSIS — Z95828 Presence of other vascular implants and grafts: Secondary | ICD-10-CM

## 2017-10-05 LAB — URINALYSIS, DIPSTICK ONLY
Bilirubin Urine: NEGATIVE
Glucose, UA: NEGATIVE mg/dL
Hgb urine dipstick: NEGATIVE
Ketones, ur: NEGATIVE mg/dL
Leukocytes, UA: NEGATIVE
Nitrite: NEGATIVE
Protein, ur: NEGATIVE mg/dL
Specific Gravity, Urine: 1.021 (ref 1.005–1.030)
pH: 5 (ref 5.0–8.0)

## 2017-10-05 LAB — CBC WITH DIFFERENTIAL/PLATELET
Basophils Absolute: 0 10*3/uL (ref 0.0–0.1)
Basophils Relative: 1 %
Eosinophils Absolute: 0.2 10*3/uL (ref 0.0–0.7)
Eosinophils Relative: 4 %
HCT: 34.7 % — ABNORMAL LOW (ref 39.0–52.0)
Hemoglobin: 11 g/dL — ABNORMAL LOW (ref 13.0–17.0)
Lymphocytes Relative: 50 %
Lymphs Abs: 2.8 10*3/uL (ref 0.7–4.0)
MCH: 32.5 pg (ref 26.0–34.0)
MCHC: 31.7 g/dL (ref 30.0–36.0)
MCV: 102.7 fL — ABNORMAL HIGH (ref 78.0–100.0)
Monocytes Absolute: 1.4 10*3/uL — ABNORMAL HIGH (ref 0.1–1.0)
Monocytes Relative: 26 %
Neutro Abs: 1 10*3/uL — ABNORMAL LOW (ref 1.7–7.7)
Neutrophils Relative %: 19 %
Platelets: 243 10*3/uL (ref 150–400)
RBC: 3.38 MIL/uL — ABNORMAL LOW (ref 4.22–5.81)
RDW: 15.6 % — ABNORMAL HIGH (ref 11.5–15.5)
WBC: 5.5 10*3/uL (ref 4.0–10.5)

## 2017-10-05 LAB — COMPREHENSIVE METABOLIC PANEL
ALT: 20 U/L (ref 0–44)
AST: 23 U/L (ref 15–41)
Albumin: 3.4 g/dL — ABNORMAL LOW (ref 3.5–5.0)
Alkaline Phosphatase: 34 U/L — ABNORMAL LOW (ref 38–126)
Anion gap: 7 (ref 5–15)
BUN: 16 mg/dL (ref 8–23)
CO2: 27 mmol/L (ref 22–32)
Calcium: 9.1 mg/dL (ref 8.9–10.3)
Chloride: 104 mmol/L (ref 98–111)
Creatinine, Ser: 1.02 mg/dL (ref 0.61–1.24)
GFR calc Af Amer: >60 mL/min (ref >60)
GFR calc non Af Amer: >60 mL/min (ref >60)
Glucose, Bld: 185 mg/dL — ABNORMAL HIGH (ref 70–99)
Potassium: 4.4 mmol/L (ref 3.5–5.1)
Sodium: 138 mmol/L (ref 135–145)
Total Bilirubin: 0.6 mg/dL (ref 0.3–1.2)
Total Protein: 6.9 g/dL (ref 6.5–8.1)

## 2017-10-05 MED ORDER — SODIUM CHLORIDE 0.9 % IV SOLN: 10.0000 mg | Freq: Once | INTRAVENOUS | Status: DC

## 2017-10-05 MED ORDER — SODIUM CHLORIDE 0.9 % IV SOLN
1920.0000 mg/m2 | INTRAVENOUS | Status: DC
  Administered 2017-10-05: 4200 mg via INTRAVENOUS
  Filled 2017-10-05: qty 84

## 2017-10-05 MED ORDER — LEUCOVORIN CALCIUM INJECTION 350 MG
321.0000 mg/m2 | Freq: Once | INTRAVENOUS | Status: AC
  Administered 2017-10-05: 700 mg via INTRAVENOUS
  Filled 2017-10-05: qty 35

## 2017-10-05 MED ORDER — PALONOSETRON HCL INJECTION 0.25 MG/5ML
INTRAVENOUS | Status: AC
  Filled 2017-10-05: qty 5

## 2017-10-05 MED ORDER — PALONOSETRON HCL INJECTION 0.25 MG/5ML
0.2500 mg | Freq: Once | INTRAVENOUS | Status: AC
  Administered 2017-10-05: 0.25 mg via INTRAVENOUS

## 2017-10-05 MED ORDER — SODIUM CHLORIDE 0.9 % IV SOLN
5.0000 mg/kg | Freq: Once | INTRAVENOUS | Status: AC
  Administered 2017-10-05: 450 mg via INTRAVENOUS
  Filled 2017-10-05: qty 2

## 2017-10-05 MED ORDER — ATROPINE SULFATE 1 MG/ML IJ SOLN
INTRAMUSCULAR | Status: AC
  Filled 2017-10-05: qty 1

## 2017-10-05 MED ORDER — DEXAMETHASONE SODIUM PHOSPHATE 10 MG/ML IJ SOLN
INTRAMUSCULAR | Status: AC
  Filled 2017-10-05: qty 1

## 2017-10-05 MED ORDER — SODIUM CHLORIDE 0.9 % IV SOLN
Freq: Once | INTRAVENOUS | Status: AC
  Administered 2017-10-05: 10:00:00 via INTRAVENOUS

## 2017-10-05 MED ORDER — IRINOTECAN HCL CHEMO INJECTION 100 MG/5ML
144.0000 mg/m2 | Freq: Once | INTRAVENOUS | Status: AC
  Administered 2017-10-05: 320 mg via INTRAVENOUS
  Filled 2017-10-05: qty 15

## 2017-10-05 MED ORDER — SODIUM CHLORIDE 0.9% FLUSH
10.0000 mL | INTRAVENOUS | Status: DC | PRN
  Administered 2017-10-05: 10 mL
  Filled 2017-10-05: qty 10

## 2017-10-05 MED ORDER — HEPARIN SOD (PORK) LOCK FLUSH 100 UNIT/ML IV SOLN
INTRAVENOUS | Status: AC
  Filled 2017-10-05: qty 5

## 2017-10-05 MED ORDER — ATROPINE SULFATE 1 MG/ML IJ SOLN
0.5000 mg | Freq: Once | INTRAMUSCULAR | Status: AC
  Administered 2017-10-05: 0.5 mg via INTRAVENOUS

## 2017-10-05 MED ORDER — OCTREOTIDE ACETATE 30 MG IM KIT
PACK | INTRAMUSCULAR | Status: AC
  Filled 2017-10-05: qty 1

## 2017-10-05 MED ORDER — DEXAMETHASONE SODIUM PHOSPHATE 10 MG/ML IJ SOLN
10.0000 mg | Freq: Once | INTRAMUSCULAR | Status: AC
  Administered 2017-10-05: 10 mg via INTRAVENOUS

## 2017-10-05 MED ORDER — FLUOROURACIL CHEMO INJECTION 2.5 GM/50ML
320.0000 mg/m2 | Freq: Once | INTRAVENOUS | Status: AC
  Administered 2017-10-05: 700 mg via INTRAVENOUS
  Filled 2017-10-05: qty 14

## 2017-10-05 NOTE — Patient Instructions (Signed)
[facility] Discharge Instructions for Patients Receiving Chemotherapy   Beginning January 23rd 2017 lab work for the Cancer Center will be done in the  Main lab at [facility] on 1st floor. If you have a lab appointment with the Cancer Center please come in thru the  Main Entrance and check in at the main information desk   Today you received the following chemotherapy agents Avastin,Irinotecan,Leucovorin and 5FU. Follow-up as scheduled. Call clinic for any questions or concerns  To help prevent nausea and vomiting after your treatment, we encourage you to take your nausea medication   If you develop nausea and vomiting, or diarrhea that is not controlled by your medication, call the clinic.  The clinic phone number is [phone]. Office hours are Monday-Friday 8:30am-5:00pm.  BELOW ARE SYMPTOMS THAT SHOULD BE REPORTED IMMEDIATELY:  *FEVER GREATER THAN 101.0 F  *CHILLS WITH OR WITHOUT FEVER  NAUSEA AND VOMITING THAT IS NOT CONTROLLED WITH YOUR NAUSEA MEDICATION  *UNUSUAL SHORTNESS OF BREATH  *UNUSUAL BRUISING OR BLEEDING  TENDERNESS IN MOUTH AND THROAT WITH OR WITHOUT PRESENCE OF ULCERS  *URINARY PROBLEMS  *BOWEL PROBLEMS  UNUSUAL RASH Items with * indicate a potential emergency and should be followed up as soon as possible. If you have an emergency after office hours please contact your primary care physician or go to the nearest emergency department.  Please call the clinic during office hours if you have any questions or concerns.   You may also contact the Patient Navigator at [phone] should you have any questions or need assistance in obtaining follow up care.      Resources For Cancer Patients and their Caregivers ? American Cancer Society: Can assist with transportation, wigs, general needs, runs Look Good Feel Better.        1-888-227-6333 ? Cancer Care: Provides financial assistance, online support groups, medication/co-pay  assistance.  1-800-813-HOPE (4673) ? [facility] Assists [place] cancer patients and their families through emotional , educational and financial support.  336-427-4357 ? [place] DSS Where to apply for food stamps, Medicaid and utility assistance. 336-342-1394 ? RCATS: Transportation to medical appointments. 336-347-2287 ? Social Security Administration: May apply for disability if have a Stage IV cancer. 336-342-7796 1-800-772-1213 ? [place] Aging, Disability and Transit Services: Assists with nutrition, care and transit needs. 336-349-2343         

## 2017-10-05 NOTE — Progress Notes (Signed)
0925 Lab and urine results reviewed with Dr. Walden Field and pt approved for chemo tx today with Udenyca injection added after 5FU pump discontinued on Wednesday per MD                                                        Kevin Kirk tolerated chemo tx well without complaints or incident. VSS upon discharge. Pt discharged with 5 FU pump infusing without issues. Pt discharged self ambulatory using cane in satisfactory condition accompanied by a friend

## 2017-10-07 ENCOUNTER — Other Ambulatory Visit: Payer: Self-pay

## 2017-10-07 ENCOUNTER — Encounter (HOSPITAL_COMMUNITY): Payer: Self-pay

## 2017-10-07 ENCOUNTER — Inpatient Hospital Stay (HOSPITAL_COMMUNITY): Payer: Medicare Other

## 2017-10-07 VITALS — BP 103/57 | HR 71 | Temp 97.8°F | Resp 18

## 2017-10-07 DIAGNOSIS — Z95828 Presence of other vascular implants and grafts: Secondary | ICD-10-CM

## 2017-10-07 DIAGNOSIS — C184 Malignant neoplasm of transverse colon: Secondary | ICD-10-CM

## 2017-10-07 DIAGNOSIS — Z5111 Encounter for antineoplastic chemotherapy: Secondary | ICD-10-CM | POA: Diagnosis not present

## 2017-10-07 MED ORDER — HEPARIN SOD (PORK) LOCK FLUSH 100 UNIT/ML IV SOLN
500.0000 [IU] | Freq: Once | INTRAVENOUS | Status: AC | PRN
  Administered 2017-10-07: 500 [IU]

## 2017-10-07 MED ORDER — PEGFILGRASTIM-CBQV 6 MG/0.6ML ~~LOC~~ SOSY
6.0000 mg | PREFILLED_SYRINGE | Freq: Once | SUBCUTANEOUS | Status: AC
  Administered 2017-10-07: 6 mg via SUBCUTANEOUS

## 2017-10-07 MED ORDER — PEGFILGRASTIM-CBQV 6 MG/0.6ML ~~LOC~~ SOSY
PREFILLED_SYRINGE | SUBCUTANEOUS | Status: AC
  Filled 2017-10-07: qty 0.6

## 2017-10-07 MED ORDER — SODIUM CHLORIDE 0.9% FLUSH
10.0000 mL | INTRAVENOUS | Status: DC | PRN
  Administered 2017-10-07: 10 mL
  Filled 2017-10-07: qty 10

## 2017-10-07 NOTE — Progress Notes (Signed)
Kevin Kirk presents to have home infusion pump d/c'd and for port-a-cath deaccess with flush.  Portacath located left chest wall accessed with  H 20 needle.  Good blood return present. Portacath flushed with NS and 500U/50ml Heparin, and needle removed intact.  Procedure tolerated well and without incident.  Kevin Kirk presents today for injection per the provider's orders.  Udenyca administration without incident; see MAR for injection details.  Patient tolerated procedure well and without incident.  No questions or complaints noted at this time.  Discharged ambulatory in c/o family.

## 2017-10-12 ENCOUNTER — Ambulatory Visit (HOSPITAL_COMMUNITY)
Admission: RE | Admit: 2017-10-12 | Discharge: 2017-10-12 | Disposition: A | Discharge status: Home / Self Care | Payer: Medicare Other | Source: Ambulatory Visit | Attending: Internal Medicine | Admitting: Internal Medicine

## 2017-10-12 DIAGNOSIS — C184 Malignant neoplasm of transverse colon: Secondary | ICD-10-CM | POA: Diagnosis not present

## 2017-10-12 DIAGNOSIS — R918 Other nonspecific abnormal finding of lung field: Secondary | ICD-10-CM | POA: Insufficient documentation

## 2017-10-12 DIAGNOSIS — K8020 Calculus of gallbladder without cholecystitis without obstruction: Secondary | ICD-10-CM | POA: Diagnosis not present

## 2017-10-12 DIAGNOSIS — N400 Enlarged prostate without lower urinary tract symptoms: Secondary | ICD-10-CM | POA: Diagnosis not present

## 2017-10-12 DIAGNOSIS — Z9081 Acquired absence of spleen: Secondary | ICD-10-CM | POA: Diagnosis not present

## 2017-10-12 MED ORDER — IOHEXOL 300 MG/ML  SOLN
100.0000 mL | Freq: Once | INTRAMUSCULAR | Status: AC | PRN
  Administered 2017-10-12: 100 mL via INTRAVENOUS

## 2017-10-12 MED ORDER — IOPAMIDOL (ISOVUE-300) INJECTION 61%: 100.0000 mL | Freq: Once | INTRAVENOUS | Status: DC | PRN

## 2017-10-19 ENCOUNTER — Ambulatory Visit (HOSPITAL_COMMUNITY): Payer: Medicare Other | Admitting: Internal Medicine

## 2017-10-19 ENCOUNTER — Other Ambulatory Visit: Payer: Self-pay

## 2017-10-19 ENCOUNTER — Encounter (HOSPITAL_COMMUNITY): Payer: Self-pay | Admitting: Internal Medicine

## 2017-10-19 ENCOUNTER — Inpatient Hospital Stay (HOSPITAL_BASED_OUTPATIENT_CLINIC_OR_DEPARTMENT_OTHER): Payer: Medicare Other | Admitting: Internal Medicine

## 2017-10-19 ENCOUNTER — Inpatient Hospital Stay (HOSPITAL_COMMUNITY): Payer: Medicare Other

## 2017-10-19 VITALS — BP 127/69 | HR 72 | Temp 97.9°F | Resp 18 | Wt 193.0 lb

## 2017-10-19 VITALS — Wt 192.9 lb

## 2017-10-19 DIAGNOSIS — R197 Diarrhea, unspecified: Secondary | ICD-10-CM | POA: Diagnosis not present

## 2017-10-19 DIAGNOSIS — C184 Malignant neoplasm of transverse colon: Secondary | ICD-10-CM

## 2017-10-19 DIAGNOSIS — Z79899 Other long term (current) drug therapy: Secondary | ICD-10-CM

## 2017-10-19 DIAGNOSIS — Z95828 Presence of other vascular implants and grafts: Secondary | ICD-10-CM

## 2017-10-19 DIAGNOSIS — D509 Iron deficiency anemia, unspecified: Secondary | ICD-10-CM | POA: Diagnosis not present

## 2017-10-19 DIAGNOSIS — C189 Malignant neoplasm of colon, unspecified: Secondary | ICD-10-CM

## 2017-10-19 DIAGNOSIS — I10 Essential (primary) hypertension: Secondary | ICD-10-CM

## 2017-10-19 DIAGNOSIS — G629 Polyneuropathy, unspecified: Secondary | ICD-10-CM | POA: Diagnosis not present

## 2017-10-19 DIAGNOSIS — Z5111 Encounter for antineoplastic chemotherapy: Secondary | ICD-10-CM | POA: Diagnosis not present

## 2017-10-19 LAB — COMPREHENSIVE METABOLIC PANEL
ALT: 23 U/L (ref 0–44)
AST: 28 U/L (ref 15–41)
Albumin: 3.5 g/dL (ref 3.5–5.0)
Alkaline Phosphatase: 50 U/L (ref 38–126)
Anion gap: 7 (ref 5–15)
BUN: 13 mg/dL (ref 8–23)
CO2: 29 mmol/L (ref 22–32)
Calcium: 9.2 mg/dL (ref 8.9–10.3)
Chloride: 104 mmol/L (ref 98–111)
Creatinine, Ser: 1.1 mg/dL (ref 0.61–1.24)
GFR calc Af Amer: >60 mL/min (ref >60)
GFR calc non Af Amer: >60 mL/min (ref >60)
Glucose, Bld: 150 mg/dL — ABNORMAL HIGH (ref 70–99)
Potassium: 5.3 mmol/L — ABNORMAL HIGH (ref 3.5–5.1)
Sodium: 140 mmol/L (ref 135–145)
Total Bilirubin: 0.7 mg/dL (ref 0.3–1.2)
Total Protein: 6.9 g/dL (ref 6.5–8.1)

## 2017-10-19 LAB — CBC WITH DIFFERENTIAL/PLATELET
Basophils Absolute: 0 10*3/uL (ref 0.0–0.1)
Basophils Relative: 0 %
Eosinophils Absolute: 0.1 10*3/uL (ref 0.0–0.7)
Eosinophils Relative: 1 %
HCT: 36.8 % — ABNORMAL LOW (ref 39.0–52.0)
Hemoglobin: 11.4 g/dL — ABNORMAL LOW (ref 13.0–17.0)
Lymphocytes Relative: 25 %
Lymphs Abs: 3.5 10*3/uL (ref 0.7–4.0)
MCH: 32.6 pg (ref 26.0–34.0)
MCHC: 31 g/dL (ref 30.0–36.0)
MCV: 105.1 fL — ABNORMAL HIGH (ref 78.0–100.0)
Monocytes Absolute: 1.8 10*3/uL — ABNORMAL HIGH (ref 0.1–1.0)
Monocytes Relative: 13 %
Neutro Abs: 8.3 10*3/uL — ABNORMAL HIGH (ref 1.7–7.7)
Neutrophils Relative %: 61 %
Platelets: 206 10*3/uL (ref 150–400)
RBC: 3.5 MIL/uL — ABNORMAL LOW (ref 4.22–5.81)
RDW: 16.4 % — ABNORMAL HIGH (ref 11.5–15.5)
WBC: 13.8 10*3/uL — ABNORMAL HIGH (ref 4.0–10.5)

## 2017-10-19 LAB — URINALYSIS, DIPSTICK ONLY
Bilirubin Urine: NEGATIVE
Glucose, UA: NEGATIVE mg/dL
Hgb urine dipstick: NEGATIVE
Ketones, ur: NEGATIVE mg/dL
Leukocytes, UA: NEGATIVE
Nitrite: NEGATIVE
Protein, ur: NEGATIVE mg/dL
Specific Gravity, Urine: 1.018 (ref 1.005–1.030)
pH: 5 (ref 5.0–8.0)

## 2017-10-19 MED ORDER — SODIUM CHLORIDE 0.9 % IV SOLN
1920.0000 mg/m2 | INTRAVENOUS | Status: DC
  Administered 2017-10-19: 4200 mg via INTRAVENOUS
  Filled 2017-10-19: qty 84

## 2017-10-19 MED ORDER — DEXAMETHASONE SODIUM PHOSPHATE 10 MG/ML IJ SOLN
INTRAMUSCULAR | Status: AC
  Filled 2017-10-19: qty 1

## 2017-10-19 MED ORDER — PALONOSETRON HCL INJECTION 0.25 MG/5ML
0.2500 mg | Freq: Once | INTRAVENOUS | Status: AC
  Administered 2017-10-19: 0.25 mg via INTRAVENOUS

## 2017-10-19 MED ORDER — ATROPINE SULFATE 1 MG/ML IJ SOLN
0.5000 mg | Freq: Once | INTRAMUSCULAR | Status: AC
  Administered 2017-10-19: 0.5 mg via INTRAVENOUS

## 2017-10-19 MED ORDER — SODIUM CHLORIDE 0.9 % IV SOLN
Freq: Once | INTRAVENOUS | Status: AC
  Administered 2017-10-19: 10:00:00 via INTRAVENOUS

## 2017-10-19 MED ORDER — IRINOTECAN HCL CHEMO INJECTION 100 MG/5ML
144.0000 mg/m2 | Freq: Once | INTRAVENOUS | Status: AC
  Administered 2017-10-19: 320 mg via INTRAVENOUS
  Filled 2017-10-19: qty 15

## 2017-10-19 MED ORDER — SODIUM CHLORIDE 0.9 % IV SOLN
5.0000 mg/kg | Freq: Once | INTRAVENOUS | Status: AC
  Administered 2017-10-19: 450 mg via INTRAVENOUS
  Filled 2017-10-19: qty 16

## 2017-10-19 MED ORDER — LEUCOVORIN CALCIUM INJECTION 350 MG
321.0000 mg/m2 | Freq: Once | INTRAVENOUS | Status: AC
  Administered 2017-10-19: 700 mg via INTRAVENOUS
  Filled 2017-10-19: qty 35

## 2017-10-19 MED ORDER — PALONOSETRON HCL INJECTION 0.25 MG/5ML
INTRAVENOUS | Status: AC
  Filled 2017-10-19: qty 5

## 2017-10-19 MED ORDER — FLUOROURACIL CHEMO INJECTION 2.5 GM/50ML
320.0000 mg/m2 | Freq: Once | INTRAVENOUS | Status: AC
  Administered 2017-10-19: 700 mg via INTRAVENOUS
  Filled 2017-10-19: qty 14

## 2017-10-19 MED ORDER — SODIUM CHLORIDE 0.9 % IV SOLN: 10.0000 mg | Freq: Once | INTRAVENOUS | Status: DC

## 2017-10-19 MED ORDER — ATROPINE SULFATE 1 MG/ML IJ SOLN
INTRAMUSCULAR | Status: AC
  Filled 2017-10-19: qty 1

## 2017-10-19 MED ORDER — DEXAMETHASONE SODIUM PHOSPHATE 10 MG/ML IJ SOLN
10.0000 mg | Freq: Once | INTRAMUSCULAR | Status: AC
  Administered 2017-10-19: 10 mg via INTRAVENOUS

## 2017-10-19 NOTE — Progress Notes (Signed)
Diagnosis No diagnosis found.  Staging Cancer Staging Malignant neoplasm of transverse colon [facility]) Staging form: Colon and Rectum, AJCC 8th Edition - Clinical: cT3, cN1a, cM1 - Signed by Twana First, MD on 03/09/2017 - Pathologic: No stage assigned - Unsigned   Assessment and Plan:  1.  Stage IV colon cancer (pT3 pN1 M1) MSI -High: -K-ras mutation negative, BRAF V600E positive, PIK3CA, CDKN2A,TP53  84 y.o. Male on immunotherapy with nivolumab for diagnosis of colon cancer under the direction of Dr Lebron Conners.  He had been on Nivo since 02/23/2017.    Due to progression on .PET scan done 07/20/2017,Pt  was presented options of therapy with Folfiri and Avastin.   Pt will be treated with 11f 400 mg/m2 with 5 fu CIVI 2400 mg/m2 over 46 hours, Leucovorin 400 mg/m2, Irinotecan 180 mg/m2 with Avastin 5 mg/kg  Every 2 weeks.  Side effects of the medication were reviewed with pt including primarily bleeding and diarrhea.   Pt is here to go over labs and scans.  CT CAP done 10/12/2017 reviewed with pt and shows  IMPRESSION: 1. Stable scattered mediastinal lymph nodes. No new or progressive findings. 2. No findings for pulmonary metastatic disease. 3. No hepatic metastatic disease. 4. The left-sided omental nodule has decreased in size since the prior PET-CT. 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 5. Small scattered retroperitoneal and pelvic lymph nodes. No new or progressive findings. 6. Prostate gland enlargement, stable. 7. Cholelithiasis.  Labs done 10/19/2017 reviewed and are adequate for therapy. UA negative for protein.  Pt will proceed with C7 of Folfiri and Avastin.  He will be seen for follow-up in 4 weeks.  He is advised to notify the office if any change  In symptoms prior to his next visit.  He will be set up for repeat imaging with CT CAP in 01/2018.    2.  Diarrhea.  When questions he reports imodium helps and  he has 1 stool a day.  Electrolytes WNL on blood work done 10/19/2017.    3.  IDA.  Patient has been treated with IV iron in the past. HB 11.4.  Will continue to monitor labs.    4.  Hypertension.  BP is 127/69.  Follow-up with PCP.    5.  Family history of GI cancers.  He reports this occurred in his brother.  Pt has  MSI High, BRAF + tumor.    6.  Leucocytosis. WBC mildly elevated at 13.8 due to recent growth factor.  Will repeat labs in 2 weeks on RTC.    Interval History:   84y.o. Treated with nivolumab for diagnosis of recurrent colon cancer under the direction of Dr PLebron Conners   Pt now on Folfiri and Avastin.    Current Status:  Pt is seen today for follow-up.  He is here to go over scans and labs.  He reports diarrhea is improved with imodium.      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[facility]   07/11/2016 Initial Diagnosis    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[facility]      07/26/2017 -  Chemotherapy    The patient had palonosetron (ALOXI) injection 0.25 mg, 0.25 mg, Intravenous,  Once, 7 of 12 cycles Administration: 0.25 mg (07/27/2017), 0.25 mg (08/10/2017), 0.25 mg (08/24/2017), 0.25 mg (09/07/2017), 0.25 mg (09/21/2017), 0.25 mg (10/05/2017) pegfilgrastim-cbqv (UDENYCA) injection 6 mg, 6 mg, Subcutaneous, Once, 2 of 7 cycles Administration: 6 mg (10/07/2017) bevacizumab (  AVASTIN) 450 mg in sodium chloride 0.9 % 100 mL chemo infusion, 5 mg/kg = 450 mg, Intravenous,  Once, 7 of 12 cycles Dose modification: 4 mg/kg (80 % of original dose 5 mg/kg, Cycle 5, Reason: Provider Judgment) Administration: 450 mg (07/27/2017), 450 mg (08/10/2017), 450 mg (08/24/2017), 450 mg (09/07/2017), 450 mg (09/21/2017), 450 mg (10/05/2017) irinotecan (CAMPTOSAR) 400 mg in dextrose 5 % 500 mL chemo infusion, 180 mg/m2 = 400 mg, Intravenous,  Once, 7 of 12 cycles Dose modification: 144 mg/m2 (80 % of original dose 180 mg/m2, Cycle 5, Reason: Provider Judgment) Administration: 400 mg (07/27/2017), 400 mg (08/10/2017), 400 mg  (08/24/2017), 400 mg (09/07/2017), 320 mg (09/21/2017), 320 mg (10/05/2017) leucovorin 800 mg in dextrose 5 % 250 mL infusion, 872 mg, Intravenous,  Once, 7 of 12 cycles Dose modification: 320 mg/m2 (80 % of original dose 400 mg/m2, Cycle 5, Reason: Provider Judgment) Administration: 800 mg (07/27/2017), 800 mg (08/10/2017), 800 mg (08/24/2017), 800 mg (09/07/2017), 700 mg (09/21/2017), 700 mg (10/05/2017) fluorouracil (ADRUCIL) chemo injection 850 mg, 400 mg/m2 = 850 mg, Intravenous,  Once, 7 of 12 cycles Dose modification: 320 mg/m2 (80 % of original dose 400 mg/m2, Cycle 5, Reason: Provider Judgment) Administration: 850 mg (07/27/2017), 850 mg (08/10/2017), 850 mg (08/24/2017), 850 mg (09/07/2017), 700 mg (09/21/2017), 700 mg (10/05/2017) fluorouracil (ADRUCIL) 5,250 mg in sodium chloride 0.9 % 145 mL chemo infusion, 2,400 mg/m2 = 5,250 mg, Intravenous, 1 Day/Dose, 7 of 12 cycles Dose modification: 1,920 mg/m2 (80 % of original dose 2,400 mg/m2, Cycle 5, Reason: Provider Judgment) Administration: 5,250 mg (07/27/2017), 5,250 mg (08/10/2017), 5,250 mg (08/24/2017), 5,250 mg (09/07/2017), 4,200 mg (09/21/2017), 4,200 mg (10/05/2017)  for chemotherapy treatment.         Problem List Patient Active Problem List   Diagnosis Date Noted  . Port-A-Cath in place [facility] 05/18/2017  . Iron deficiency anemia [D50.9] 09/07/2016  . S/P partial colectomy [Z90.49] 07/11/2016  . 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[place]) [C18.4]   . High cholesterol [E78.00] 04/22/2016  . H/O aortic valve replacement [Z95.2] 04/22/2016  . Diabetes ([place]) [E11.9] 04/22/2016  . Chronic pulmonary embolism ([place]) [I27.82] 04/22/2016  . Rectal bleeding [K62.5] 04/22/2016    Past Medical History Past Medical History:  Diagnosis Date  . Chronic pulmonary embolism ([place]) 67/3419   compliction of the valve replacement surgery  . COPD (chronic obstructive pulmonary disease) ([place])   . Diabetes ([place]) 04/22/2016  . H/O aortic valve replacement 10/2008   . Hypercholesteremia   . Hypertension   . Pulmonary emboli ([place]) 10/2008  . Sleep apnea     Past Surgical History Past Surgical History:  Procedure Laterality Date  . AORTIC VALVE REPLACEMENT     2010  . CARDIAC CATHETERIZATION  2010  . COLONOSCOPY N/A 06/05/2016   Procedure: COLONOSCOPY;  Surgeon: Rogene Houston, MD;  Location: [facility] END[facility] SUITE;  Service: Endoscopy;  Laterality: N/A;  . PARTIAL COLECTOMY N/A 07/11/2016   Procedure: PARTIAL COLECTOMY;  Surgeon: Aviva Signs, MD;  Location: [facility] ORS;  Service: General;  Laterality: N/A;  . PORTACATH PLACEMENT N/A 08/13/2016   Procedure: INSERTION PORT-A-CATH LEFT SUBCLAVIAN;  Surgeon: Aviva Signs, MD;  Location: [facility] ORS;  Service: General;  Laterality: N/A;  . REPLACEMENT TOTAL KNEE     1996  . spenectomy     from trauma    Family History Family History  Problem Relation Age of Onset  . Colon cancer Neg Hx      Social History  reports that he  quit smoking about 55 years ago. His smoking use included cigarettes. He quit after 15.00 years of use. He has never used smokeless tobacco. He reports that he does not drink alcohol or use drugs.  Medications  Current Outpatient Medications:  .  atorvastatin (LIPITOR) 10 MG tablet, Take 10 mg by mouth at bedtime. , Disp: , Rfl:  .  Cholecalciferol (VITAMIN D3) 5000 units CAPS, Take 5,000 Units by mouth daily. , Disp: , Rfl:  .  diphenoxylate-atropine (LOMOTIL) 2.5-0.025 MG tablet, Take 1 tablet by mouth 4 (four) times daily as needed for diarrhea or loose stools., Disp: 60 tablet, Rfl: 3 .  finasteride (PROSCAR) 5 MG tablet, Take 5 mg by mouth at bedtime. , Disp: , Rfl:  .  gabapentin (NEURONTIN) 600 MG tablet, Take 1 tablet (600 mg total) by mouth 3 (three) times daily. (Patient taking differently: Take 600 mg by mouth 2 (two) times daily. ), Disp: 90 tablet, Rfl: 2 .  lisinopril (PRINIVIL,ZESTRIL) 10 MG tablet, , Disp: , Rfl:  .  lisinopril (PRINIVIL,ZESTRIL) 5 MG tablet, Take 5 mg by  mouth at bedtime. , Disp: , Rfl:  .  metFORMIN (GLUCOPHAGE) 1000 MG tablet, , Disp: , Rfl:  .  metFORMIN (GLUCOPHAGE) 500 MG tablet, Take 500 mg by mouth 2 (two) times daily., Disp: , Rfl:  .  metoprolol (LOPRESSOR) 50 MG tablet, Take 50 mg by mouth 2 (two) times daily., Disp: , Rfl:  .  RELION CONFIRM/MICRO TEST test strip, USE 1 STRIP TO CHECK GLUCOSE ONCE DAILY, Disp: , Rfl: 12 .  vitamin B-12 (CYANOCOBALAMIN) 1000 MCG tablet, Take 1,000 mcg by mouth daily. , Disp: , Rfl:  No current facility-administered medications for this visit.   Facility-Administered Medications Ordered in Other Visits:  .  fluorouracil (ADRUCIL) 4,200 mg in sodium chloride 0.9 % 66 mL chemo infusion, 1,920 mg/m2 (Order-Specific), Intravenous, 1 day or 1 dose, Maicey Barrientez, MD .  fluorouracil (ADRUCIL) chemo injection 700 mg, 320 mg/m2 (Order-Specific), Intravenous, Once, Lindalee Huizinga, MD .  irinotecan (CAMPTOSAR) 320 mg in dextrose 5 % 500 mL chemo infusion, 144 mg/m2 (Order-Specific), Intravenous, Once, Amarria Andreasen, MD, Last Rate: 344 mL/hr at 10/19/17 1120, 320 mg at 10/19/17 1120 .  leucovorin 700 mg in dextrose 5 % 250 mL infusion, 321 mg/m2 (Order-Specific), Intravenous, Once, Gregori Abril, MD, Last Rate: 190 mL/hr at 10/19/17 1121, 700 mg at 10/19/17 1121  Allergies Amoxicillin and Tamsulosin hcl  Review of Systems Review of Systems - Oncology ROS as per HPI otherwise 12 point ROS is negative.   Physical Exam  Vitals Wt Readings from Last 3 Encounters:  10/19/17 193 lb (87.5 kg)  10/19/17 192 lb 14.4 oz (87.5 kg)  10/05/17 194 lb 3.2 oz (88.1 kg)   Temp Readings from Last 3 Encounters:  10/19/17 97.9 F (36.6 C) (Oral)  10/07/17 97.8 F (36.6 C) (Oral)  10/05/17 97.7 F (36.5 C) (Oral)   BP Readings from Last 3 Encounters:  10/19/17 127/69  10/07/17 (!) 103/57  10/05/17 (!) 98/52   Pulse Readings from Last 3 Encounters:  10/19/17 72  10/07/17 71  10/05/17 (!) 54   Constitutional:  Well-developed, well-nourished, and in no distress.   HENT:  Head: Normocephalic and atraumatic.  Mouth/Throat: No oropharyngeal exudate. Mucosa moist. Eyes: Pupils are equal, round, and reactive to light. Conjunctivae are normal. No scleral icterus.  Neck: Normal range of motion. Neck supple. No JVD present.  Cardiovascular: Normal rate, regular rhythm and normal heart sounds.  Exam reveals  no gallop and no friction rub.   No murmur heard. Pulmonary/Chest: Effort normal and breath sounds normal. No respiratory distress. No wheezes.No rales.  Abdominal: Soft. Bowel sounds are normal. No distension. There is no tenderness. There is no guarding.  Musculoskeletal: No edema or tenderness.  Lymphadenopathy: No cervical, axillary or supraclavicular adenopathy.  Neurological: Alert and oriented to person, place, and time. No cranial nerve deficit.  Skin: Skin is warm and dry. No rash noted. No erythema. No pallor.  Psychiatric: Affect and judgment normal.   Labs Appointment on 10/19/2017  Component Date Value Ref Range Status  . WBC 10/19/2017 13.8* 4.0 - 10.5 K/uL Final  . RBC 10/19/2017 3.50* 4.22 - 5.81 MIL/uL Final  . Hemoglobin 10/19/2017 11.4* 13.0 - 17.0 g/dL Final  . HCT 10/19/2017 36.8* 39.0 - 52.0 % Final  . MCV 10/19/2017 105.1* 78.0 - 100.0 fL Final  . MCH 10/19/2017 32.6  26.0 - 34.0 pg Final  . MCHC 10/19/2017 31.0  30.0 - 36.0 g/dL Final  . RDW 10/19/2017 16.4* 11.5 - 15.5 % Final  . Platelets 10/19/2017 206  150 - 400 K/uL Final  . Neutrophils Relative % 10/19/2017 61  % Final  . Neutro Abs 10/19/2017 8.3* 1.7 - 7.7 K/uL Final  . Lymphocytes Relative 10/19/2017 25  % Final  . Lymphs Abs 10/19/2017 3.5  0.7 - 4.0 K/uL Final  . Monocytes Relative 10/19/2017 13  % Final  . Monocytes Absolute 10/19/2017 1.8* 0.1 - 1.0 K/uL Final  . Eosinophils Relative 10/19/2017 1  % Final  . Eosinophils Absolute 10/19/2017 0.1  0.0 - 0.7 K/uL Final  . Basophils Relative 10/19/2017 0  %  Final  . Basophils Absolute 10/19/2017 0.0  0.0 - 0.1 K/uL Final   Performed at [facility], [address] [place], [place] [address]  . Sodium 10/19/2017 140  135 - 145 mmol/L Final  . Potassium 10/19/2017 5.3* 3.5 - 5.1 mmol/L Final  . Chloride 10/19/2017 104  98 - 111 mmol/L Final  . CO2 10/19/2017 29  22 - 32 mmol/L Final  . Glucose, Bld 10/19/2017 150* 70 - 99 mg/dL Final  . BUN 10/19/2017 13  8 - 23 mg/dL Final  . Creatinine, Ser 10/19/2017 1.10  0.61 - 1.24 mg/dL Final  . Calcium 10/19/2017 9.2  8.9 - 10.3 mg/dL Final  . Total Protein 10/19/2017 6.9  6.5 - 8.1 g/dL Final  . Albumin 10/19/2017 3.5  3.5 - 5.0 g/dL Final  . AST 10/19/2017 28  15 - 41 U/L Final  . ALT 10/19/2017 23  0 - 44 U/L Final  . Alkaline Phosphatase 10/19/2017 50  38 - 126 U/L Final  . Total Bilirubin 10/19/2017 0.7  0.3 - 1.2 mg/dL Final  . GFR calc non Af Amer 10/19/2017 >60  >60 mL/min Final  . GFR calc Af Amer 10/19/2017 >60  >60 mL/min Final   Comment: (NOTE) The eGFR has been calculated using the CKD EPI equation. This calculation has not been validated in all clinical situations. eGFR's persistently <60 mL/min signify possible Chronic Kidney Disease.   Georgiann Hahn gap 10/19/2017 7  5 - 15 Final   Performed at [facility], [address], [place], [place] [address]  . Color, Urine 10/19/2017 YELLOW  YELLOW Final  . APPearance 10/19/2017 CLEAR  CLEAR Final  . Specific Gravity, Urine 10/19/2017 1.018  1.005 - 1.030 Final  . pH 10/19/2017 5.0  5.0 - 8.0 Final  . Glucose, UA 10/19/2017 NEGATIVE  NEGATIVE  mg/dL Final  . Hgb urine dipstick 10/19/2017 NEGATIVE  NEGATIVE Final  . Bilirubin Urine 10/19/2017 NEGATIVE  NEGATIVE Final  . Ketones, ur 10/19/2017 NEGATIVE  NEGATIVE mg/dL Final  . Protein, ur 10/19/2017 NEGATIVE  NEGATIVE mg/dL Final  . Nitrite 10/19/2017 NEGATIVE  NEGATIVE Final  . Leukocytes, UA 10/19/2017 NEGATIVE  NEGATIVE Final   Performed at [facility], [address] [place], [place]  [address]     Pathology No orders of the defined types were placed in this encounter.      Zoila Shutter MD

## 2017-10-19 NOTE — Progress Notes (Signed)
Tolerated infusions w/o adverse reaction.  Alert, in no distress.  VSS.  Discharged ambulatory in c/o friend with ambulatory pump infusing.

## 2017-10-21 ENCOUNTER — Inpatient Hospital Stay (HOSPITAL_COMMUNITY): Payer: Medicare Other

## 2017-10-21 ENCOUNTER — Other Ambulatory Visit: Payer: Self-pay

## 2017-10-21 VITALS — BP 114/64 | HR 65 | Temp 97.6°F | Resp 18

## 2017-10-21 DIAGNOSIS — C184 Malignant neoplasm of transverse colon: Secondary | ICD-10-CM

## 2017-10-21 DIAGNOSIS — Z5111 Encounter for antineoplastic chemotherapy: Secondary | ICD-10-CM | POA: Diagnosis not present

## 2017-10-21 DIAGNOSIS — Z95828 Presence of other vascular implants and grafts: Secondary | ICD-10-CM

## 2017-10-21 MED ORDER — PEGFILGRASTIM-CBQV 6 MG/0.6ML ~~LOC~~ SOSY
PREFILLED_SYRINGE | SUBCUTANEOUS | Status: AC
  Filled 2017-10-21: qty 0.6

## 2017-10-21 MED ORDER — SODIUM CHLORIDE 0.9% FLUSH
10.0000 mL | INTRAVENOUS | Status: DC | PRN
  Administered 2017-10-21: 10 mL
  Filled 2017-10-21: qty 10

## 2017-10-21 MED ORDER — HEPARIN SOD (PORK) LOCK FLUSH 100 UNIT/ML IV SOLN
500.0000 [IU] | Freq: Once | INTRAVENOUS | Status: AC | PRN
  Administered 2017-10-21: 500 [IU]

## 2017-10-21 MED ORDER — PEGFILGRASTIM-CBQV 6 MG/0.6ML ~~LOC~~ SOSY
6.0000 mg | PREFILLED_SYRINGE | Freq: Once | SUBCUTANEOUS | Status: AC
  Administered 2017-10-21: 6 mg via SUBCUTANEOUS

## 2017-10-21 NOTE — Progress Notes (Signed)
Kevin Kirk presents to have home infusion pump d/c'd and for port-a-cath deaccess with flush. Portacath located left chest wall accessed with  H 20 needle.  Good blood return present. Portacath flushed with NS and 500U/27ml Heparin, and needle removed intact.  Procedure tolerated well and without incident.  Kevin Kirk presents today for injection per the provider's orders.  Udenyca administration without incident; see MAR for injection details.  Patient tolerated procedure well and without incident.  No questions or complaints noted at this time.  Discharged ambulatory in c/o family.

## 2017-10-30 ENCOUNTER — Other Ambulatory Visit (HOSPITAL_COMMUNITY): Payer: Self-pay | Admitting: Internal Medicine

## 2017-11-02 ENCOUNTER — Ambulatory Visit (HOSPITAL_COMMUNITY)
Admission: RE | Admit: 2017-11-02 | Discharge: 2017-11-02 | Disposition: A | Discharge status: Home / Self Care | Payer: Medicare Other | Source: Ambulatory Visit | Attending: Hematology | Admitting: Hematology

## 2017-11-02 ENCOUNTER — Inpatient Hospital Stay (HOSPITAL_COMMUNITY): Payer: Medicare Other

## 2017-11-02 ENCOUNTER — Inpatient Hospital Stay (HOSPITAL_COMMUNITY): Payer: Medicare Other | Attending: Hematology

## 2017-11-02 VITALS — BP 123/57 | HR 64 | Temp 97.5°F | Resp 18 | Ht 72.0 in | Wt 195.8 lb

## 2017-11-02 DIAGNOSIS — C184 Malignant neoplasm of transverse colon: Secondary | ICD-10-CM

## 2017-11-02 DIAGNOSIS — D509 Iron deficiency anemia, unspecified: Secondary | ICD-10-CM | POA: Insufficient documentation

## 2017-11-02 DIAGNOSIS — R197 Diarrhea, unspecified: Secondary | ICD-10-CM | POA: Insufficient documentation

## 2017-11-02 DIAGNOSIS — Z95828 Presence of other vascular implants and grafts: Secondary | ICD-10-CM

## 2017-11-02 DIAGNOSIS — M7989 Other specified soft tissue disorders: Secondary | ICD-10-CM | POA: Insufficient documentation

## 2017-11-02 DIAGNOSIS — I8001 Phlebitis and thrombophlebitis of superficial vessels of right lower extremity: Secondary | ICD-10-CM | POA: Diagnosis not present

## 2017-11-02 DIAGNOSIS — Z5111 Encounter for antineoplastic chemotherapy: Secondary | ICD-10-CM | POA: Insufficient documentation

## 2017-11-02 DIAGNOSIS — I10 Essential (primary) hypertension: Secondary | ICD-10-CM | POA: Diagnosis not present

## 2017-11-02 DIAGNOSIS — G629 Polyneuropathy, unspecified: Secondary | ICD-10-CM | POA: Diagnosis not present

## 2017-11-02 DIAGNOSIS — M79661 Pain in right lower leg: Secondary | ICD-10-CM | POA: Insufficient documentation

## 2017-11-02 DIAGNOSIS — Z7689 Persons encountering health services in other specified circumstances: Secondary | ICD-10-CM | POA: Insufficient documentation

## 2017-11-02 DIAGNOSIS — Z79899 Other long term (current) drug therapy: Secondary | ICD-10-CM | POA: Diagnosis not present

## 2017-11-02 LAB — COMPREHENSIVE METABOLIC PANEL
ALT: 36 U/L (ref 0–44)
AST: 41 U/L (ref 15–41)
Albumin: 3.2 g/dL — ABNORMAL LOW (ref 3.5–5.0)
Alkaline Phosphatase: 71 U/L (ref 38–126)
Anion gap: 7 (ref 5–15)
BUN: 14 mg/dL (ref 8–23)
CO2: 27 mmol/L (ref 22–32)
Calcium: 8.8 mg/dL — ABNORMAL LOW (ref 8.9–10.3)
Chloride: 104 mmol/L (ref 98–111)
Creatinine, Ser: 1.02 mg/dL (ref 0.61–1.24)
GFR calc Af Amer: >60 mL/min (ref >60)
GFR calc non Af Amer: >60 mL/min (ref >60)
Glucose, Bld: 151 mg/dL — ABNORMAL HIGH (ref 70–99)
Potassium: 4.7 mmol/L (ref 3.5–5.1)
Sodium: 138 mmol/L (ref 135–145)
Total Bilirubin: 0.5 mg/dL (ref 0.3–1.2)
Total Protein: 6.5 g/dL (ref 6.5–8.1)

## 2017-11-02 LAB — URINALYSIS, DIPSTICK ONLY
Bilirubin Urine: NEGATIVE
Glucose, UA: 50 mg/dL — AB
Hgb urine dipstick: NEGATIVE
Ketones, ur: NEGATIVE mg/dL
Leukocytes, UA: NEGATIVE
Nitrite: NEGATIVE
Protein, ur: NEGATIVE mg/dL
Specific Gravity, Urine: 1.018 (ref 1.005–1.030)
pH: 5 (ref 5.0–8.0)

## 2017-11-02 LAB — CBC WITH DIFFERENTIAL/PLATELET
Basophils Absolute: 0.1 10*3/uL (ref 0.0–0.1)
Basophils Relative: 1 %
Eosinophils Absolute: 0.2 10*3/uL (ref 0.0–0.7)
Eosinophils Relative: 2 %
HCT: 34.1 % — ABNORMAL LOW (ref 39.0–52.0)
Hemoglobin: 10.6 g/dL — ABNORMAL LOW (ref 13.0–17.0)
Lymphocytes Relative: 29 %
Lymphs Abs: 3.5 10*3/uL (ref 0.7–4.0)
MCH: 32.7 pg (ref 26.0–34.0)
MCHC: 31.1 g/dL (ref 30.0–36.0)
MCV: 105.2 fL — ABNORMAL HIGH (ref 78.0–100.0)
Monocytes Absolute: 2.5 10*3/uL — ABNORMAL HIGH (ref 0.1–1.0)
Monocytes Relative: 21 %
Neutro Abs: 5.9 10*3/uL (ref 1.7–7.7)
Neutrophils Relative %: 47 %
Platelets: 291 10*3/uL (ref 150–400)
RBC: 3.24 MIL/uL — ABNORMAL LOW (ref 4.22–5.81)
RDW: 16 % — ABNORMAL HIGH (ref 11.5–15.5)
WBC: 12.2 10*3/uL — ABNORMAL HIGH (ref 4.0–10.5)

## 2017-11-02 MED ORDER — FLUOROURACIL CHEMO INJECTION 2.5 GM/50ML
320.0000 mg/m2 | Freq: Once | INTRAVENOUS | Status: AC
  Administered 2017-11-02: 700 mg via INTRAVENOUS
  Filled 2017-11-02: qty 14

## 2017-11-02 MED ORDER — IRINOTECAN HCL CHEMO INJECTION 100 MG/5ML
144.0000 mg/m2 | Freq: Once | INTRAVENOUS | Status: AC
  Administered 2017-11-02: 320 mg via INTRAVENOUS
  Filled 2017-11-02: qty 15

## 2017-11-02 MED ORDER — PALONOSETRON HCL INJECTION 0.25 MG/5ML
0.2500 mg | Freq: Once | INTRAVENOUS | Status: AC
  Administered 2017-11-02: 0.25 mg via INTRAVENOUS

## 2017-11-02 MED ORDER — DEXAMETHASONE SODIUM PHOSPHATE 10 MG/ML IJ SOLN
INTRAMUSCULAR | Status: AC
  Filled 2017-11-02: qty 1

## 2017-11-02 MED ORDER — ATROPINE SULFATE 1 MG/ML IJ SOLN
0.5000 mg | Freq: Once | INTRAMUSCULAR | Status: AC
  Administered 2017-11-02: 0.5 mg via INTRAVENOUS

## 2017-11-02 MED ORDER — BEVACIZUMAB CHEMO INJECTION 400 MG/16ML
5.0000 mg/kg | Freq: Once | INTRAVENOUS | Status: AC
  Administered 2017-11-02: 450 mg via INTRAVENOUS
  Filled 2017-11-02: qty 16

## 2017-11-02 MED ORDER — SODIUM CHLORIDE 0.9 % IV SOLN
Freq: Once | INTRAVENOUS | Status: AC
  Administered 2017-11-02: 11:00:00 via INTRAVENOUS

## 2017-11-02 MED ORDER — SODIUM CHLORIDE 0.9% FLUSH
10.0000 mL | INTRAVENOUS | Status: DC | PRN
  Administered 2017-11-02: 10 mL
  Filled 2017-11-02: qty 10

## 2017-11-02 MED ORDER — PALONOSETRON HCL INJECTION 0.25 MG/5ML
INTRAVENOUS | Status: AC
  Filled 2017-11-02: qty 5

## 2017-11-02 MED ORDER — SODIUM CHLORIDE 0.9 % IV SOLN
1920.0000 mg/m2 | INTRAVENOUS | Status: DC
  Administered 2017-11-02: 4200 mg via INTRAVENOUS
  Filled 2017-11-02: qty 84

## 2017-11-02 MED ORDER — DEXAMETHASONE SODIUM PHOSPHATE 10 MG/ML IJ SOLN
10.0000 mg | Freq: Once | INTRAMUSCULAR | Status: AC
  Administered 2017-11-02: 10 mg via INTRAVENOUS

## 2017-11-02 MED ORDER — SODIUM CHLORIDE 0.9 % IV SOLN: 10.0000 mg | Freq: Once | INTRAVENOUS | Status: DC

## 2017-11-02 MED ORDER — ATROPINE SULFATE 1 MG/ML IJ SOLN
INTRAMUSCULAR | Status: AC
  Filled 2017-11-02: qty 1

## 2017-11-02 MED ORDER — LEUCOVORIN CALCIUM INJECTION 350 MG
321.0000 mg/m2 | Freq: Once | INTRAVENOUS | Status: AC
  Administered 2017-11-02: 700 mg via INTRAVENOUS
  Filled 2017-11-02: qty 35

## 2017-11-02 NOTE — Progress Notes (Signed)
Selvin H [facility] tolerated Avastin and Folfiri infusions without incident or complaints. VSS upon completion of treatment. Port left accessed infusing 5FU on home pump. Patient discharged self ambulatory using cane in presence of a friend.

## 2017-11-02 NOTE — Patient Instructions (Signed)
[place] at [facility]  Discharge Instructions:  Today you received Avastin and FOLFIRI. Follow up as scheduled. Call clinic for any questions or concerns. _______________________________________________________________  Thank you for choosing [place] at [facility] to provide your oncology and hematology care.  To afford each patient quality time with our providers, please arrive at least 15 minutes before your scheduled appointment.  You need to re-schedule your appointment if you arrive 10 or more minutes late.  We strive to give you quality time with our providers, and arriving late affects you and other patients whose appointments are after yours.  Also, if you no show three or more times for appointments you may be dismissed from the clinic.  Again, thank you for choosing [place] at [facility] hope is that these requests will allow you access to exceptional care and in a timely manner. _______________________________________________________________  If you have questions after your visit, please contact our office at [phone] between the hours of 8:30 a.m. and 5:00 p.m. Voicemails left after 4:30 p.m. will not be returned until the following business day. _______________________________________________________________  For prescription refill requests, have your pharmacy contact our office. _______________________________________________________________  Recommendations made by the consultant and any test results will be sent to your referring physician. _______________________________________________________________

## 2017-11-02 NOTE — Progress Notes (Signed)
37 Pt complained of right lower leg calf and ankle swelling with soreness along the anterior between his knee and ankle. Dr. Delton Coombes made aware and sent down for doppler study of RLE. Labs reviewed as well with MD and pt approved for chemo tx today     Results of Doppler study showed no DVT. DR. Delton Coombes made aware

## 2017-11-04 ENCOUNTER — Inpatient Hospital Stay (HOSPITAL_COMMUNITY): Payer: Medicare Other

## 2017-11-04 ENCOUNTER — Other Ambulatory Visit: Payer: Self-pay

## 2017-11-04 ENCOUNTER — Encounter (HOSPITAL_COMMUNITY): Payer: Self-pay

## 2017-11-04 VITALS — BP 125/61 | HR 62 | Temp 97.8°F | Resp 18

## 2017-11-04 DIAGNOSIS — Z5111 Encounter for antineoplastic chemotherapy: Secondary | ICD-10-CM | POA: Diagnosis not present

## 2017-11-04 DIAGNOSIS — C184 Malignant neoplasm of transverse colon: Secondary | ICD-10-CM

## 2017-11-04 DIAGNOSIS — Z95828 Presence of other vascular implants and grafts: Secondary | ICD-10-CM

## 2017-11-04 MED ORDER — HEPARIN SOD (PORK) LOCK FLUSH 100 UNIT/ML IV SOLN
500.0000 [IU] | Freq: Once | INTRAVENOUS | Status: AC | PRN
  Administered 2017-11-04: 500 [IU]

## 2017-11-04 MED ORDER — SODIUM CHLORIDE 0.9% FLUSH
10.0000 mL | INTRAVENOUS | Status: DC | PRN
  Administered 2017-11-04: 10 mL
  Filled 2017-11-04: qty 10

## 2017-11-04 MED ORDER — HEPARIN SOD (PORK) LOCK FLUSH 100 UNIT/ML IV SOLN
INTRAVENOUS | Status: AC
  Filled 2017-11-04: qty 5

## 2017-11-04 MED ORDER — PEGFILGRASTIM-CBQV 6 MG/0.6ML ~~LOC~~ SOSY
6.0000 mg | PREFILLED_SYRINGE | Freq: Once | SUBCUTANEOUS | Status: AC
  Administered 2017-11-04: 6 mg via SUBCUTANEOUS
  Filled 2017-11-04: qty 0.6

## 2017-11-04 MED ORDER — PEGFILGRASTIM 6 MG/0.6ML ~~LOC~~ PSKT
PREFILLED_SYRINGE | SUBCUTANEOUS | Status: AC
  Filled 2017-11-04: qty 0.6

## 2017-11-04 NOTE — Progress Notes (Signed)
Kevin Kirk presents to have home infusion pump d/c'd and for port-a-cath deaccess with flush.  Portacath located left chest wall accessed with  H 20 needle.  Good blood return present. Portacath flushed with NS and 500U/65ml Heparin, and needle removed intact.  Procedure tolerated well and without incident.  Discharged ambulatory in c/o family.

## 2017-11-12 ENCOUNTER — Other Ambulatory Visit (HOSPITAL_COMMUNITY): Payer: Self-pay

## 2017-11-12 DIAGNOSIS — G629 Polyneuropathy, unspecified: Secondary | ICD-10-CM

## 2017-11-12 MED ORDER — GABAPENTIN 600 MG PO TABS: 600.0000 mg | ORAL_TABLET | Freq: Three times a day (TID) | ORAL | 2 refills | Status: DC

## 2017-11-12 NOTE — Telephone Encounter (Signed)
Patient called for refill on gabapentin. Reviewed with provider, chart checked and reordered.

## 2017-11-16 ENCOUNTER — Encounter (HOSPITAL_COMMUNITY): Payer: Self-pay | Admitting: Internal Medicine

## 2017-11-16 ENCOUNTER — Other Ambulatory Visit: Payer: Self-pay

## 2017-11-16 ENCOUNTER — Inpatient Hospital Stay (HOSPITAL_COMMUNITY): Payer: Medicare Other

## 2017-11-16 ENCOUNTER — Inpatient Hospital Stay (HOSPITAL_BASED_OUTPATIENT_CLINIC_OR_DEPARTMENT_OTHER): Payer: Medicare Other | Admitting: Internal Medicine

## 2017-11-16 VITALS — BP 123/58 | HR 72 | Temp 97.3°F | Resp 18

## 2017-11-16 VITALS — BP 116/60 | HR 63 | Temp 97.8°F | Resp 20 | Ht 72.0 in | Wt 196.5 lb

## 2017-11-16 DIAGNOSIS — R197 Diarrhea, unspecified: Secondary | ICD-10-CM

## 2017-11-16 DIAGNOSIS — C184 Malignant neoplasm of transverse colon: Secondary | ICD-10-CM

## 2017-11-16 DIAGNOSIS — D7589 Other specified diseases of blood and blood-forming organs: Secondary | ICD-10-CM

## 2017-11-16 DIAGNOSIS — D508 Other iron deficiency anemias: Secondary | ICD-10-CM

## 2017-11-16 DIAGNOSIS — Z79899 Other long term (current) drug therapy: Secondary | ICD-10-CM

## 2017-11-16 DIAGNOSIS — D509 Iron deficiency anemia, unspecified: Secondary | ICD-10-CM | POA: Diagnosis not present

## 2017-11-16 DIAGNOSIS — Z5111 Encounter for antineoplastic chemotherapy: Secondary | ICD-10-CM | POA: Diagnosis not present

## 2017-11-16 DIAGNOSIS — I10 Essential (primary) hypertension: Secondary | ICD-10-CM

## 2017-11-16 DIAGNOSIS — G629 Polyneuropathy, unspecified: Secondary | ICD-10-CM

## 2017-11-16 DIAGNOSIS — Z95828 Presence of other vascular implants and grafts: Secondary | ICD-10-CM

## 2017-11-16 LAB — URINALYSIS, DIPSTICK ONLY
Bilirubin Urine: NEGATIVE
Glucose, UA: NEGATIVE mg/dL
Hgb urine dipstick: NEGATIVE
Ketones, ur: NEGATIVE mg/dL
Leukocytes, UA: NEGATIVE
Nitrite: NEGATIVE
Protein, ur: NEGATIVE mg/dL
Specific Gravity, Urine: 1.006 (ref 1.005–1.030)
pH: 5 (ref 5.0–8.0)

## 2017-11-16 LAB — COMPREHENSIVE METABOLIC PANEL
ALT: 20 U/L (ref 0–44)
AST: 22 U/L (ref 15–41)
Albumin: 3.5 g/dL (ref 3.5–5.0)
Alkaline Phosphatase: 67 U/L (ref 38–126)
Anion gap: 6 (ref 5–15)
BUN: 11 mg/dL (ref 8–23)
CO2: 28 mmol/L (ref 22–32)
Calcium: 8.8 mg/dL — ABNORMAL LOW (ref 8.9–10.3)
Chloride: 105 mmol/L (ref 98–111)
Creatinine, Ser: 1.09 mg/dL (ref 0.61–1.24)
GFR calc Af Amer: >60 mL/min (ref >60)
GFR calc non Af Amer: >60 mL/min (ref >60)
Glucose, Bld: 166 mg/dL — ABNORMAL HIGH (ref 70–99)
Potassium: 4.6 mmol/L (ref 3.5–5.1)
Sodium: 139 mmol/L (ref 135–145)
Total Bilirubin: 0.5 mg/dL (ref 0.3–1.2)
Total Protein: 6.5 g/dL (ref 6.5–8.1)

## 2017-11-16 LAB — CBC WITH DIFFERENTIAL/PLATELET
Basophils Absolute: 0.1 10*3/uL (ref 0.0–0.1)
Basophils Relative: 0 %
Eosinophils Absolute: 0.3 10*3/uL (ref 0.0–0.7)
Eosinophils Relative: 2 %
HCT: 35.4 % — ABNORMAL LOW (ref 39.0–52.0)
Hemoglobin: 11 g/dL — ABNORMAL LOW (ref 13.0–17.0)
Lymphocytes Relative: 29 %
Lymphs Abs: 3.3 10*3/uL (ref 0.7–4.0)
MCH: 32.9 pg (ref 26.0–34.0)
MCHC: 31.1 g/dL (ref 30.0–36.0)
MCV: 106 fL — ABNORMAL HIGH (ref 78.0–100.0)
Monocytes Absolute: 1.6 10*3/uL — ABNORMAL HIGH (ref 0.1–1.0)
Monocytes Relative: 14 %
Neutro Abs: 6.1 10*3/uL (ref 1.7–7.7)
Neutrophils Relative %: 55 %
Platelets: 279 10*3/uL (ref 150–400)
RBC: 3.34 MIL/uL — ABNORMAL LOW (ref 4.22–5.81)
RDW: 16.1 % — ABNORMAL HIGH (ref 11.5–15.5)
WBC: 11.3 10*3/uL — ABNORMAL HIGH (ref 4.0–10.5)

## 2017-11-16 MED ORDER — ATROPINE SULFATE 1 MG/ML IJ SOLN
0.5000 mg | Freq: Once | INTRAMUSCULAR | Status: AC
  Administered 2017-11-16: 0.5 mg via INTRAVENOUS
  Filled 2017-11-16: qty 1

## 2017-11-16 MED ORDER — PALONOSETRON HCL INJECTION 0.25 MG/5ML
0.2500 mg | Freq: Once | INTRAVENOUS | Status: AC
  Administered 2017-11-16: 0.25 mg via INTRAVENOUS
  Filled 2017-11-16: qty 5

## 2017-11-16 MED ORDER — IRINOTECAN HCL CHEMO INJECTION 100 MG/5ML
144.0000 mg/m2 | Freq: Once | INTRAVENOUS | Status: AC
  Administered 2017-11-16: 320 mg via INTRAVENOUS
  Filled 2017-11-16: qty 16

## 2017-11-16 MED ORDER — SODIUM CHLORIDE 0.9 % IV SOLN
5.0000 mg/kg | Freq: Once | INTRAVENOUS | Status: AC
  Administered 2017-11-16: 450 mg via INTRAVENOUS
  Filled 2017-11-16: qty 2

## 2017-11-16 MED ORDER — SODIUM CHLORIDE 0.9 % IV SOLN
Freq: Once | INTRAVENOUS | Status: AC
  Administered 2017-11-16: 10:00:00 via INTRAVENOUS

## 2017-11-16 MED ORDER — FLUOROURACIL CHEMO INJECTION 2.5 GM/50ML
320.0000 mg/m2 | Freq: Once | INTRAVENOUS | Status: AC
  Administered 2017-11-16: 700 mg via INTRAVENOUS
  Filled 2017-11-16: qty 14

## 2017-11-16 MED ORDER — LEUCOVORIN CALCIUM INJECTION 350 MG
321.0000 mg/m2 | Freq: Once | INTRAVENOUS | Status: AC
  Administered 2017-11-16: 700 mg via INTRAVENOUS
  Filled 2017-11-16: qty 35

## 2017-11-16 MED ORDER — SODIUM CHLORIDE 0.9 % IV SOLN
1920.0000 mg/m2 | INTRAVENOUS | Status: DC
  Administered 2017-11-16: 4200 mg via INTRAVENOUS
  Filled 2017-11-16: qty 84

## 2017-11-16 MED ORDER — SODIUM CHLORIDE 0.9 % IV SOLN
10.0000 mg | Freq: Once | INTRAVENOUS | Status: AC
  Administered 2017-11-16: 10 mg via INTRAVENOUS
  Filled 2017-11-16: qty 1

## 2017-11-16 NOTE — Progress Notes (Signed)
Tolerated infusions w/o adverse reaction.  Alert, in no distress.  VSS.  Discharged ambulatory in c/o friend.

## 2017-11-16 NOTE — Patient Instructions (Signed)
[place] at [facility] Discharge Instructions   You were seen today by Dr. Zoila Shutter   Thank you for choosing [place] at [facility] to provide your oncology and hematology care.  To afford each patient quality time with our provider, please arrive at least 15 minutes before your scheduled appointment time.    If you have a lab appointment with the [facility] please come in thru the  Main Entrance and check in at the main information desk  You need to re-schedule your appointment should you arrive 10 or more minutes late.  We strive to give you quality time with our providers, and arriving late affects you and other patients whose appointments are after yours.  Also, if you no show three or more times for appointments you may be dismissed from the clinic at the providers discretion.     Again, thank you for choosing [facility].  Our hope is that these requests will decrease the amount of time that you wait before being seen by our physicians.       _____________________________________________________________  Should you have questions after your visit to [facility], please contact our office at [phone] between the hours of 8:30 a.m. and 4:30 p.m.  Voicemails left after 4:30 p.m. will not be returned until the following business day.  For prescription refill requests, have your pharmacy contact our office.       Resources For Cancer Patients and their Caregivers ? American Cancer Society: Can assist with transportation, wigs, general needs, runs Look Good Feel Better.        208 089 7295 ? Cancer Care: Provides financial assistance, online support groups, medication/co-pay assistance.  1-800-813-HOPE 7043652233) ? [facility] Assists [place] cancer patients and their families through emotional , educational and financial support.  419-804-9214 ? [place] DSS Where  to apply for food stamps, Medicaid and utility assistance. 212 480 1870 ? RCATS: Transportation to medical appointments. (817)736-6961 ? Social Security Administration: May apply for disability if have a Stage IV cancer. (509)609-9741 (636) 149-1343 ? [employer], Disability and Transit Services: Assists with nutrition, care and transit needs. [place] Support Programs:   > Cancer Support Group  2nd Tuesday of the month 1pm-2pm, Journey Room   > Creative Journey  3rd Tuesday of the month 1130am-1pm, Journey Room

## 2017-11-16 NOTE — Progress Notes (Signed)
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[place]) - Plan: CBC with Differential/Platelet, Comprehensive metabolic panel, Lactate dehydrogenase, Urinalysis, dipstick only, CBC with Differential/Platelet, Comprehensive metabolic panel, Lactate dehydrogenase, Urinalysis, dipstick only, DISCONTINUED: 0.9 %  sodium chloride infusion, DISCONTINUED: atropine injection 0.5 mg, DISCONTINUED: palonosetron (ALOXI) injection 0.25 mg, DISCONTINUED: dexamethasone (DECADRON) 10 mg in sodium chloride 0.9 % 50 mL IVPB, DISCONTINUED: bevacizumab (AVASTIN) 450 mg in sodium chloride 0.9 % 100 mL chemo infusion, DISCONTINUED: irinotecan (CAMPTOSAR) 320 mg in dextrose 5 % 500 mL chemo infusion, DISCONTINUED: leucovorin 700 mg in dextrose 5 % 250 mL infusion, DISCONTINUED: fluorouracil (ADRUCIL) chemo injection 700 mg, DISCONTINUED: fluorouracil (ADRUCIL) 4,200 mg in sodium chloride 0.9 % 66 mL chemo infusion  Staging Cancer Staging 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HCC) Staging form: Colon and Rectum, AJCC 8th Edition - Clinical: cT3, cN1a, cM1 - Signed by Twana First, MD on 03/09/2017 - Pathologic: No stage assigned - Unsigned   Assessment and Plan:   1.  Stage IV colon cancer (pT3 pN1 M1) MSI -High: -K-ras mutation negative, BRAF V600E positive, PIK3CA, CDKN2A,TP53  84 y.o. Male treated with immunotherapy with nivolumab for diagnosis of colon cancer under the direction of Dr Lebron Conners.  He had been on Nivo since 02/23/2017.    Due to progression on .PET scan done 07/20/2017,Pt  was presented options of therapy with Folfiri and Avastin.   Pt will be treated with 46f 400 mg/m2 with 5 fu CIVI 2400 mg/m2 over 46 hours, Leucovorin 400 mg/m2, Irinotecan 180 mg/m2 with Avastin 5 mg/kg  Every 2 weeks.  Side effects of the medication were reviewed with pt including primarily bleeding and diarrhea.   CT CAP done 10/12/2017 showed   IMPRESSION: 1. Stable scattered mediastinal lymph nodes. No new or  progressive findings. 2. No findings for pulmonary metastatic disease. 3. No hepatic metastatic disease. 4. The left-sided omental nodule has decreased in size since the prior PET-CT. 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 5. Small scattered retroperitoneal and pelvic lymph nodes. No new or progressive findings. 6. Prostate gland enlargement, stable. 7. Cholelithiasis.  He is here for evaluation prior to C9 Folfiri and Avastin.  Labs done 11/16/2017 reviewed and showed WBC 11.3 HB 11 plts 279,000.  Chemistries WNL with K+ 4.6 Cr 1.09 and normal LFTs.  UA negative for protein.  Labs Are adequate for therapy.  He will continue treatment every 2 weeks and will be seen for follow-up in 4 weeks with labs. He will be set up for repeat imaging with CT CAP in 01/2018.    2.  Macrocytosis.  Will check B12, Folate, MMA on RTC.  HB 11.    3.  Superficial thrombophlebitis.  He reports some right ankle pain.  He has no redness noted on LE.  RLE doppler done 11/02/2017 reviewed and showed IMPRESSION: 1.  No evidence of right lower extremity deep vein thrombosis. 2. Superficial thrombophlebitis of the right great saphenous vein in the calf.  I have discussed with him to try Aleve prn for pain due to doppler findings.  He should notify the office if no improvement in symptoms.    4  Diarrhea.  He reports this improves with imodium and home remedies.  He should notify the office if any change in symptoms.  Electrolytes WNL on labs done 11/16/2017.    5.  IDA.  Patient has been treated with IV iron in the past. HB stable at 11.  Will check ferritin on labs in 2 weeks.    6.  Hypertension.  BP is 116/60.  Follow-up with PCP.    7.  Family history of GI cancers.  He reports this occurred in his brother.  Pt has  MSI High, BRAF + tumor.    Interval History:   84 y.o. Treated with nivolumab for diagnosis of recurrent colon cancer under the  direction of Dr Lebron Conners.   Pt now on Folfiri and Avastin.    Current Status:  Pt is seen today for follow-up.  He is here for evaluation prior to C9 of Folfiri and Avastin.  He reports some right ankle pain.      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[place])   07/11/2016 Initial Diagnosis    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[place])    07/26/2017 -  Chemotherapy    The patient had palonosetron (ALOXI) injection 0.25 mg, 0.25 mg, Intravenous,  Once, 9 of 12 cycles Administration: 0.25 mg (07/27/2017), 0.25 mg (08/10/2017), 0.25 mg (08/24/2017), 0.25 mg (09/07/2017), 0.25 mg (09/21/2017), 0.25 mg (10/05/2017), 0.25 mg (10/19/2017), 0.25 mg (11/02/2017) pegfilgrastim-cbqv (UDENYCA) injection 6 mg, 6 mg, Subcutaneous, Once, 4 of 7 cycles Administration: 6 mg (10/07/2017), 6 mg (10/21/2017), 6 mg (11/04/2017) bevacizumab (AVASTIN) 450 mg in sodium chloride 0.9 % 100 mL chemo infusion, 5 mg/kg = 450 mg, Intravenous,  Once, 9 of 12 cycles Dose modification: 4 mg/kg (80 % of original dose 5 mg/kg, Cycle 5, Reason: Provider Judgment) Administration: 450 mg (07/27/2017), 450 mg (08/10/2017), 450 mg (08/24/2017), 450 mg (09/07/2017), 450 mg (09/21/2017), 450 mg (10/05/2017), 450 mg (10/19/2017), 450 mg (11/02/2017) irinotecan (CAMPTOSAR) 400 mg in dextrose 5 % 500 mL chemo infusion, 180 mg/m2 = 400 mg, Intravenous,  Once, 9 of 12 cycles Dose modification: 144 mg/m2 (80 % of original dose 180 mg/m2, Cycle 5, Reason: Provider Judgment) Administration: 400 mg (07/27/2017), 400 mg (08/10/2017), 400 mg (08/24/2017), 400 mg (09/07/2017), 320 mg (09/21/2017), 320 mg (10/05/2017), 320 mg (10/19/2017), 320 mg (11/02/2017) leucovorin 800 mg in dextrose 5 % 250 mL infusion, 872 mg, Intravenous,  Once, 9 of 12 cycles Dose modification: 320 mg/m2 (80 % of original dose 400 mg/m2, Cycle 5, Reason: Provider Judgment) Administration: 800 mg (07/27/2017), 800 mg (08/10/2017), 800 mg (08/24/2017), 800 mg (09/07/2017), 700 mg (09/21/2017), 700 mg (10/05/2017), 700 mg  (10/19/2017), 700 mg (11/02/2017) fluorouracil (ADRUCIL) chemo injection 850 mg, 400 mg/m2 = 850 mg, Intravenous,  Once, 9 of 12 cycles Dose modification: 320 mg/m2 (80 % of original dose 400 mg/m2, Cycle 5, Reason: Provider Judgment) Administration: 850 mg (07/27/2017), 850 mg (08/10/2017), 850 mg (08/24/2017), 850 mg (09/07/2017), 700 mg (09/21/2017), 700 mg (10/05/2017), 700 mg (10/19/2017), 700 mg (11/02/2017) fluorouracil (ADRUCIL) 5,250 mg in sodium chloride 0.9 % 145 mL chemo infusion, 2,400 mg/m2 = 5,250 mg, Intravenous, 1 Day/Dose, 9 of 12 cycles Dose modification: 1,920 mg/m2 (80 % of original dose 2,400 mg/m2, Cycle 5, Reason: Provider Judgment) Administration: 5,250 mg (07/27/2017), 5,250 mg (08/10/2017), 5,250 mg (08/24/2017), 5,250 mg (09/07/2017), 4,200 mg (09/21/2017), 4,200 mg (10/05/2017), 4,200 mg (10/19/2017), 4,200 mg (11/02/2017)  for chemotherapy treatment.       Problem List Patient Active Problem List   Diagnosis Date Noted  . Port-A-Cath in [facility] 05/18/2017  . Iron deficiency anemia [D50.9] 09/07/2016  . S/P partial colectomy [Z90.49] 07/11/2016  . 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[place]) [C18.4]   . High cholesterol [E78.00] 04/22/2016  . H/O aortic valve replacement [Z95.2] 04/22/2016  . Diabetes (  [place]) [E11.9] 04/22/2016  . Chronic pulmonary embolism ([place]) [I27.82] 04/22/2016  . Rectal bleeding [K62.5] 04/22/2016    Past Medical History Past Medical History:  Diagnosis Date  . Chronic pulmonary embolism ([place]) 11/3816   compliction of the valve replacement surgery  . COPD (chronic obstructive pulmonary disease) ([place])   . Diabetes ([place]) 04/22/2016  . H/O aortic valve replacement 10/2008  . Hypercholesteremia   . Hypertension   . Pulmonary emboli ([place]) 10/2008  . Sleep apnea     Past Surgical History Past Surgical History:  Procedure Laterality Date  . AORTIC VALVE REPLACEMENT     2010  . CARDIAC CATHETERIZATION  2010  . COLONOSCOPY N/A 06/05/2016    Procedure: COLONOSCOPY;  Surgeon: Rogene Houston, MD;  Location: [facility];  Service: Endoscopy;  Laterality: N/A;  . PARTIAL COLECTOMY N/A 07/11/2016   Procedure: PARTIAL COLECTOMY;  Surgeon: Aviva Signs, MD;  Location: [facility] ORS;  Service: General;  Laterality: N/A;  . PORTACATH PLACEMENT N/A 08/13/2016   Procedure: INSERTION PORT-A-CATH LEFT SUBCLAVIAN;  Surgeon: Aviva Signs, MD;  Location: [facility] ORS;  Service: General;  Laterality: N/A;  . REPLACEMENT TOTAL KNEE     1996  . spenectomy     from trauma    Family History Family History  Problem Relation Age of Onset  . Colon cancer Neg Hx      Social History  reports that he quit smoking about 55 years ago. His smoking use included cigarettes. He quit after 15.00 years of use. He has never used smokeless tobacco. He reports that he does not drink alcohol or use drugs.  Medications  Current Outpatient Medications:  .  atorvastatin (LIPITOR) 10 MG tablet, Take 10 mg by mouth at bedtime. , Disp: , Rfl:  .  Cholecalciferol (VITAMIN D3) 5000 units CAPS, Take 5,000 Units by mouth daily. , Disp: , Rfl:  .  diphenoxylate-atropine (LOMOTIL) 2.5-0.025 MG tablet, Take 1 tablet by mouth 4 (four) times daily as needed for diarrhea or loose stools., Disp: 60 tablet, Rfl: 3 .  finasteride (PROSCAR) 5 MG tablet, Take 5 mg by mouth at bedtime. , Disp: , Rfl:  .  gabapentin (NEURONTIN) 600 MG tablet, Take 1 tablet (600 mg total) by mouth 3 (three) times daily., Disp: 90 tablet, Rfl: 2 .  lisinopril (PRINIVIL,ZESTRIL) 5 MG tablet, Take 5 mg by mouth at bedtime. , Disp: , Rfl:  .  metFORMIN (GLUCOPHAGE) 500 MG tablet, Take 500 mg by mouth 2 (two) times daily., Disp: , Rfl:  .  metoprolol (LOPRESSOR) 50 MG tablet, Take 50 mg by mouth 2 (two) times daily., Disp: , Rfl:  .  vitamin B-12 (CYANOCOBALAMIN) 1000 MCG tablet, Take 1,000 mcg by mouth daily. , Disp: , Rfl:  No current facility-administered medications for this visit.   Facility-Administered  Medications Ordered in Other Visits:  .  0.9 %  sodium chloride infusion, , Intravenous, Once, Wenceslaus Gist, MD .  atropine injection 0.5 mg, 0.5 mg, Intravenous, Once, Lacara Dunsworth, Mathis Dad, MD .  bevacizumab (AVASTIN) 450 mg in sodium chloride 0.9 % 100 mL chemo infusion, 5 mg/kg (Order-Specific), Intravenous, Once, Berthe Oley, Mathis Dad, MD .  dexamethasone (DECADRON) 10 mg in sodium chloride 0.9 % 50 mL IVPB, 10 mg, Intravenous, Once, Alwin Lanigan, MD .  fluorouracil (ADRUCIL) 4,200 mg in sodium chloride 0.9 % 66 mL chemo infusion, 1,920 mg/m2 (Order-Specific), Intravenous, 1 day or 1 dose, Emberley Kral, MD .  fluorouracil (ADRUCIL) chemo injection 700 mg, 320 mg/m2 (Order-Specific), Intravenous, Once,  Dameion Briles, Mathis Dad, MD .  irinotecan (CAMPTOSAR) 320 mg in dextrose 5 % 500 mL chemo infusion, 144 mg/m2 (Order-Specific), Intravenous, Once, Antion Andres, MD .  leucovorin 700 mg in dextrose 5 % 250 mL infusion, 321 mg/m2 (Order-Specific), Intravenous, Once, Jerrik Housholder, Mathis Dad, MD .  palonosetron (ALOXI) injection 0.25 mg, 0.25 mg, Intravenous, Once, Sumner Kirchman, Mathis Dad, MD  Allergies Amoxicillin and Tamsulosin hcl  Review of Systems Review of Systems - Oncology ROS negative other than right ankle pain and diarrhea   Physical Exam  Vitals Wt Readings from Last 3 Encounters:  11/16/17 196 lb 8 oz (89.1 kg)  11/02/17 195 lb 12.8 oz (88.8 kg)  10/19/17 193 lb (87.5 kg)   Temp Readings from Last 3 Encounters:  11/16/17 97.8 F (36.6 C) (Oral)  11/04/17 97.8 F (36.6 C) (Oral)  11/02/17 (!) 97.5 F (36.4 C) (Oral)   BP Readings from Last 3 Encounters:  11/16/17 116/60  11/04/17 125/61  11/02/17 (!) 123/57   Pulse Readings from Last 3 Encounters:  11/16/17 63  11/04/17 62  11/02/17 64    Constitutional: Well-developed, well-nourished, and in no distress.   HENT: Head: Normocephalic and atraumatic.  Mouth/Throat: No oropharyngeal exudate. Mucosa moist. Eyes: Pupils are equal, round, and reactive to light.  Conjunctivae are normal. No scleral icterus.  Neck: Normal range of motion. Neck supple. No JVD present.  Cardiovascular: Normal rate, regular rhythm and normal heart sounds.  Exam reveals no gallop and no friction rub.   No murmur heard. Pulmonary/Chest: Effort normal and breath sounds normal. No respiratory distress. No wheezes.No rales.  Abdominal: Soft. Bowel sounds are normal. No distension. There is no tenderness. There is no guarding.  Musculoskeletal: Mild right ankle tenderness.  No edema.   Lymphadenopathy: No cervical, axillary or supraclavicular adenopathy.  Neurological: Alert and oriented to person, place, and time. No cranial nerve deficit.  Skin: Skin is warm and dry. No rash noted. No erythema. No pallor.  Psychiatric: Affect and judgment normal.   Labs Appointment on 11/16/2017  Component Date Value Ref Range Status  . WBC 11/16/2017 11.3* 4.0 - 10.5 K/uL Final  . RBC 11/16/2017 3.34* 4.22 - 5.81 MIL/uL Final  . Hemoglobin 11/16/2017 11.0* 13.0 - 17.0 g/dL Final  . HCT 11/16/2017 35.4* 39.0 - 52.0 % Final  . MCV 11/16/2017 106.0* 78.0 - 100.0 fL Final  . MCH 11/16/2017 32.9  26.0 - 34.0 pg Final  . MCHC 11/16/2017 31.1  30.0 - 36.0 g/dL Final  . RDW 11/16/2017 16.1* 11.5 - 15.5 % Final  . Platelets 11/16/2017 279  150 - 400 K/uL Final  . Neutrophils Relative % 11/16/2017 55  % Final  . Neutro Abs 11/16/2017 6.1  1.7 - 7.7 K/uL Final  . Lymphocytes Relative 11/16/2017 29  % Final  . Lymphs Abs 11/16/2017 3.3  0.7 - 4.0 K/uL Final  . Monocytes Relative 11/16/2017 14  % Final  . Monocytes Absolute 11/16/2017 1.6* 0.1 - 1.0 K/uL Final  . Eosinophils Relative 11/16/2017 2  % Final  . Eosinophils Absolute 11/16/2017 0.3  0.0 - 0.7 K/uL Final  . Basophils Relative 11/16/2017 0  % Final  . Basophils Absolute 11/16/2017 0.1  0.0 - 0.1 K/uL Final   Performed at [facility], [address], [place], [place] [address]  . Sodium 11/16/2017 139  135 - 145 mmol/L Final  .  Potassium 11/16/2017 4.6  3.5 - 5.1 mmol/L Final  . Chloride 11/16/2017 105  98 - 111 mmol/L Final  . CO2  11/16/2017 28  22 - 32 mmol/L Final  . Glucose, Bld 11/16/2017 166* 70 - 99 mg/dL Final  . BUN 11/16/2017 11  8 - 23 mg/dL Final  . Creatinine, Ser 11/16/2017 1.09  0.61 - 1.24 mg/dL Final  . Calcium 11/16/2017 8.8* 8.9 - 10.3 mg/dL Final  . Total Protein 11/16/2017 6.5  6.5 - 8.1 g/dL Final  . Albumin 11/16/2017 3.5  3.5 - 5.0 g/dL Final  . AST 11/16/2017 22  15 - 41 U/L Final  . ALT 11/16/2017 20  0 - 44 U/L Final  . Alkaline Phosphatase 11/16/2017 67  38 - 126 U/L Final  . Total Bilirubin 11/16/2017 0.5  0.3 - 1.2 mg/dL Final  . GFR calc non Af Amer 11/16/2017 >60  >60 mL/min Final  . GFR calc Af Amer 11/16/2017 >60  >60 mL/min Final   Comment: (NOTE) The eGFR has been calculated using the CKD EPI equation. This calculation has not been validated in all clinical situations. eGFR's persistently <60 mL/min signify possible Chronic Kidney Disease.   Georgiann Hahn gap 11/16/2017 6  5 - 15 Final   Performed at [facility], [address] [place], [place] [address]  . Color, Urine 11/16/2017 YELLOW  YELLOW Final  . APPearance 11/16/2017 CLEAR  CLEAR Final  . Specific Gravity, Urine 11/16/2017 1.006  1.005 - 1.030 Final  . pH 11/16/2017 5.0  5.0 - 8.0 Final  . Glucose, UA 11/16/2017 NEGATIVE  NEGATIVE mg/dL Final  . Hgb urine dipstick 11/16/2017 NEGATIVE  NEGATIVE Final  . Bilirubin Urine 11/16/2017 NEGATIVE  NEGATIVE Final  . Ketones, ur 11/16/2017 NEGATIVE  NEGATIVE mg/dL Final  . Protein, ur 11/16/2017 NEGATIVE  NEGATIVE mg/dL Final  . Nitrite 11/16/2017 NEGATIVE  NEGATIVE Final  . Leukocytes, UA 11/16/2017 NEGATIVE  NEGATIVE Final   Performed at [facility], [address] [place], [place] [address]     Pathology Orders Placed This Encounter  Procedures  . CBC with Differential/Platelet    Standing Status:   Future    Standing Expiration Date:   11/17/2018  . Comprehensive  metabolic panel    Standing Status:   Future    Standing Expiration Date:   11/17/2018  . Lactate dehydrogenase    Standing Status:   Future    Standing Expiration Date:   11/17/2018  . Urinalysis, dipstick only    Standing Status:   Future    Standing Expiration Date:   11/17/2018  . CBC with Differential/Platelet    Standing Status:   Future    Standing Expiration Date:   11/17/2018  . Comprehensive metabolic panel    Standing Status:   Future    Standing Expiration Date:   11/17/2018  . Lactate dehydrogenase    Standing Status:   Future    Standing Expiration Date:   11/17/2018  . Urinalysis, dipstick only    Standing Status:   Future    Standing Expiration Date:   11/17/2018       Zoila Shutter MD

## 2017-11-18 ENCOUNTER — Inpatient Hospital Stay (HOSPITAL_COMMUNITY): Payer: Medicare Other

## 2017-11-18 VITALS — BP 122/67 | HR 57 | Temp 97.7°F | Resp 18

## 2017-11-18 DIAGNOSIS — Z5111 Encounter for antineoplastic chemotherapy: Secondary | ICD-10-CM | POA: Diagnosis not present

## 2017-11-18 DIAGNOSIS — Z95828 Presence of other vascular implants and grafts: Secondary | ICD-10-CM

## 2017-11-18 DIAGNOSIS — C184 Malignant neoplasm of transverse colon: Secondary | ICD-10-CM

## 2017-11-18 MED ORDER — HEPARIN SOD (PORK) LOCK FLUSH 100 UNIT/ML IV SOLN
500.0000 [IU] | Freq: Once | INTRAVENOUS | Status: AC | PRN
  Administered 2017-11-18: 500 [IU]
  Filled 2017-11-18: qty 5

## 2017-11-18 MED ORDER — PEGFILGRASTIM-CBQV 6 MG/0.6ML ~~LOC~~ SOSY
6.0000 mg | PREFILLED_SYRINGE | Freq: Once | SUBCUTANEOUS | Status: AC
  Administered 2017-11-18: 6 mg via SUBCUTANEOUS

## 2017-11-18 MED ORDER — PEGFILGRASTIM-CBQV 6 MG/0.6ML ~~LOC~~ SOSY
PREFILLED_SYRINGE | SUBCUTANEOUS | Status: AC
  Filled 2017-11-18: qty 0.6

## 2017-11-18 MED ORDER — SODIUM CHLORIDE 0.9% FLUSH
10.0000 mL | INTRAVENOUS | Status: DC | PRN
  Administered 2017-11-18: 10 mL
  Filled 2017-11-18: qty 10

## 2017-11-19 ENCOUNTER — Encounter (HOSPITAL_COMMUNITY): Payer: Self-pay

## 2017-11-19 ENCOUNTER — Encounter: Payer: Self-pay | Admitting: Hematology

## 2017-11-19 ENCOUNTER — Ambulatory Visit (INDEPENDENT_AMBULATORY_CARE_PROVIDER_SITE_OTHER): Payer: Medicare Other | Admitting: Otolaryngology

## 2017-11-19 DIAGNOSIS — H903 Sensorineural hearing loss, bilateral: Secondary | ICD-10-CM

## 2017-11-19 LAB — HM DIABETES EYE EXAM

## 2017-11-19 NOTE — Progress Notes (Signed)
11/18/17 Late Entry: Kevin Kirk returns today for port de access and flush after 46 hr continous infusion of 35fu. Tolerated infusion without problems. Portacath located chest wall was deaccessed and flushed with 48ml NS and 500U/54ml Heparin and needle removed intact.  Procedure without incident. Patient tolerated procedure well.  Kevin Kirk presents today for injection per MD orders. Udenyca administered SQ in right Abdomen. Administration without incident. Patient tolerated well.

## 2017-11-27 ENCOUNTER — Other Ambulatory Visit (HOSPITAL_COMMUNITY): Payer: Self-pay | Admitting: Hematology

## 2017-11-30 ENCOUNTER — Inpatient Hospital Stay (HOSPITAL_COMMUNITY): Payer: Medicare Other | Attending: Hematology

## 2017-11-30 ENCOUNTER — Inpatient Hospital Stay (HOSPITAL_COMMUNITY): Payer: Medicare Other

## 2017-11-30 VITALS — BP 126/63 | HR 55 | Temp 97.4°F | Resp 18 | Wt 192.0 lb

## 2017-11-30 DIAGNOSIS — Z7689 Persons encountering health services in other specified circumstances: Secondary | ICD-10-CM | POA: Diagnosis not present

## 2017-11-30 DIAGNOSIS — D7589 Other specified diseases of blood and blood-forming organs: Secondary | ICD-10-CM | POA: Diagnosis not present

## 2017-11-30 DIAGNOSIS — Z79899 Other long term (current) drug therapy: Secondary | ICD-10-CM | POA: Diagnosis not present

## 2017-11-30 DIAGNOSIS — E119 Type 2 diabetes mellitus without complications: Secondary | ICD-10-CM | POA: Insufficient documentation

## 2017-11-30 DIAGNOSIS — Z87891 Personal history of nicotine dependence: Secondary | ICD-10-CM | POA: Diagnosis not present

## 2017-11-30 DIAGNOSIS — I10 Essential (primary) hypertension: Secondary | ICD-10-CM | POA: Diagnosis not present

## 2017-11-30 DIAGNOSIS — E7800 Pure hypercholesterolemia, unspecified: Secondary | ICD-10-CM | POA: Insufficient documentation

## 2017-11-30 DIAGNOSIS — G4730 Sleep apnea, unspecified: Secondary | ICD-10-CM | POA: Insufficient documentation

## 2017-11-30 DIAGNOSIS — K8020 Calculus of gallbladder without cholecystitis without obstruction: Secondary | ICD-10-CM | POA: Diagnosis not present

## 2017-11-30 DIAGNOSIS — Z5111 Encounter for antineoplastic chemotherapy: Secondary | ICD-10-CM | POA: Diagnosis present

## 2017-11-30 DIAGNOSIS — C184 Malignant neoplasm of transverse colon: Secondary | ICD-10-CM

## 2017-11-30 DIAGNOSIS — J449 Chronic obstructive pulmonary disease, unspecified: Secondary | ICD-10-CM | POA: Insufficient documentation

## 2017-11-30 DIAGNOSIS — Z7984 Long term (current) use of oral hypoglycemic drugs: Secondary | ICD-10-CM | POA: Insufficient documentation

## 2017-11-30 DIAGNOSIS — I8001 Phlebitis and thrombophlebitis of superficial vessels of right lower extremity: Secondary | ICD-10-CM | POA: Diagnosis not present

## 2017-11-30 DIAGNOSIS — Z95828 Presence of other vascular implants and grafts: Secondary | ICD-10-CM

## 2017-11-30 LAB — CBC WITH DIFFERENTIAL/PLATELET
Basophils Absolute: 0.1 10*3/uL (ref 0.0–0.1)
Basophils Relative: 1 %
Eosinophils Absolute: 0.3 10*3/uL (ref 0.0–0.7)
Eosinophils Relative: 3 %
HCT: 37 % — ABNORMAL LOW (ref 39.0–52.0)
Hemoglobin: 11.3 g/dL — ABNORMAL LOW (ref 13.0–17.0)
Lymphocytes Relative: 33 %
Lymphs Abs: 3.3 10*3/uL (ref 0.7–4.0)
MCH: 32.8 pg (ref 26.0–34.0)
MCHC: 30.5 g/dL (ref 30.0–36.0)
MCV: 107.2 fL — ABNORMAL HIGH (ref 78.0–100.0)
Monocytes Absolute: 1.8 10*3/uL — ABNORMAL HIGH (ref 0.1–1.0)
Monocytes Relative: 18 %
Neutro Abs: 4.4 10*3/uL (ref 1.7–7.7)
Neutrophils Relative %: 45 %
Platelets: 225 10*3/uL (ref 150–400)
RBC: 3.45 MIL/uL — ABNORMAL LOW (ref 4.22–5.81)
RDW: 16.1 % — ABNORMAL HIGH (ref 11.5–15.5)
WBC: 9.9 10*3/uL (ref 4.0–10.5)

## 2017-11-30 LAB — LACTATE DEHYDROGENASE: LDH: 207 U/L — ABNORMAL HIGH (ref 98–192)

## 2017-11-30 LAB — COMPREHENSIVE METABOLIC PANEL
ALT: 19 U/L (ref 0–44)
AST: 24 U/L (ref 15–41)
Albumin: 3.7 g/dL (ref 3.5–5.0)
Alkaline Phosphatase: 67 U/L (ref 38–126)
Anion gap: 8 (ref 5–15)
BUN: 17 mg/dL (ref 8–23)
CO2: 26 mmol/L (ref 22–32)
Calcium: 9 mg/dL (ref 8.9–10.3)
Chloride: 107 mmol/L (ref 98–111)
Creatinine, Ser: 1.28 mg/dL — ABNORMAL HIGH (ref 0.61–1.24)
GFR calc Af Amer: 58 mL/min — ABNORMAL LOW (ref >60)
GFR calc non Af Amer: 50 mL/min — ABNORMAL LOW (ref >60)
Glucose, Bld: 149 mg/dL — ABNORMAL HIGH (ref 70–99)
Potassium: 4.3 mmol/L (ref 3.5–5.1)
Sodium: 141 mmol/L (ref 135–145)
Total Bilirubin: 0.7 mg/dL (ref 0.3–1.2)
Total Protein: 6.9 g/dL (ref 6.5–8.1)

## 2017-11-30 LAB — URINALYSIS, DIPSTICK ONLY
Bilirubin Urine: NEGATIVE
Glucose, UA: NEGATIVE mg/dL
Hgb urine dipstick: NEGATIVE
Ketones, ur: NEGATIVE mg/dL
Leukocytes, UA: NEGATIVE
Nitrite: NEGATIVE
Protein, ur: NEGATIVE mg/dL
Specific Gravity, Urine: 1.025 (ref 1.005–1.030)
pH: 5 (ref 5.0–8.0)

## 2017-11-30 MED ORDER — IRINOTECAN HCL CHEMO INJECTION 100 MG/5ML
144.0000 mg/m2 | Freq: Once | INTRAVENOUS | Status: AC
  Administered 2017-11-30: 320 mg via INTRAVENOUS
  Filled 2017-11-30: qty 15

## 2017-11-30 MED ORDER — SODIUM CHLORIDE 0.9 % IV SOLN
Freq: Once | INTRAVENOUS | Status: AC
  Administered 2017-11-30: 10:00:00 via INTRAVENOUS

## 2017-11-30 MED ORDER — PALONOSETRON HCL INJECTION 0.25 MG/5ML
0.2500 mg | Freq: Once | INTRAVENOUS | Status: AC
  Administered 2017-11-30: 0.25 mg via INTRAVENOUS

## 2017-11-30 MED ORDER — ATROPINE SULFATE 1 MG/ML IJ SOLN
0.5000 mg | Freq: Once | INTRAMUSCULAR | Status: AC
  Administered 2017-11-30: 0.5 mg via INTRAVENOUS

## 2017-11-30 MED ORDER — SODIUM CHLORIDE 0.9 % IV SOLN
1920.0000 mg/m2 | INTRAVENOUS | Status: DC
  Administered 2017-11-30: 4200 mg via INTRAVENOUS
  Filled 2017-11-30: qty 84

## 2017-11-30 MED ORDER — SODIUM CHLORIDE 0.9 % IV SOLN
10.0000 mg | Freq: Once | INTRAVENOUS | Status: AC
  Administered 2017-11-30: 10 mg via INTRAVENOUS
  Filled 2017-11-30: qty 1

## 2017-11-30 MED ORDER — SODIUM CHLORIDE 0.9 % IV SOLN
5.0000 mg/kg | Freq: Once | INTRAVENOUS | Status: AC
  Administered 2017-11-30: 450 mg via INTRAVENOUS
  Filled 2017-11-30: qty 4

## 2017-11-30 MED ORDER — PALONOSETRON HCL INJECTION 0.25 MG/5ML
INTRAVENOUS | Status: AC
  Filled 2017-11-30: qty 5

## 2017-11-30 MED ORDER — ATROPINE SULFATE 1 MG/ML IJ SOLN
INTRAMUSCULAR | Status: AC
  Filled 2017-11-30: qty 1

## 2017-11-30 MED ORDER — LEUCOVORIN CALCIUM INJECTION 350 MG
321.0000 mg/m2 | Freq: Once | INTRAVENOUS | Status: AC
  Administered 2017-11-30: 700 mg via INTRAVENOUS
  Filled 2017-11-30: qty 35

## 2017-11-30 MED ORDER — FLUOROURACIL CHEMO INJECTION 2.5 GM/50ML
320.0000 mg/m2 | Freq: Once | INTRAVENOUS | Status: AC
  Administered 2017-11-30: 700 mg via INTRAVENOUS
  Filled 2017-11-30: qty 14

## 2017-11-30 MED ORDER — SODIUM CHLORIDE 0.9% FLUSH
10.0000 mL | INTRAVENOUS | Status: DC | PRN
  Administered 2017-11-30: 10 mL
  Filled 2017-11-30: qty 10

## 2017-11-30 NOTE — Patient Instructions (Signed)
[place] at [facility]  Discharge Instructions:  Today you received Avastin and FOLFIRI. Follow up as scheduled. Call clinic for any questions or concerns. _______________________________________________________________  Thank you for choosing [place] at [facility] to provide your oncology and hematology care.  To afford each patient quality time with our providers, please arrive at least 15 minutes before your scheduled appointment.  You need to re-schedule your appointment if you arrive 10 or more minutes late.  We strive to give you quality time with our providers, and arriving late affects you and other patients whose appointments are after yours.  Also, if you no show three or more times for appointments you may be dismissed from the clinic.  Again, thank you for choosing [place] at [place] hope is that these requests will allow you access to exceptional care and in a timely manner. _______________________________________________________________  If you have questions after your visit, please contact our office at [phone] between the hours of 8:30 a.m. and 5:00 p.m. Voicemails left after 4:30 p.m. will not be returned until the following business day. _______________________________________________________________  For prescription refill requests, have your pharmacy contact our office. _______________________________________________________________  Recommendations made by the consultant and any test results will be sent to your referring physician. _______________________________________________________________

## 2017-11-30 NOTE — Progress Notes (Signed)
[id number] Patient reports a rash from his waist down after pump removal last treatment. He states it was little red spots that were very itchy, mostly on his groin and thighs. Itlasted for a few days and he did not take anything for it. Dr. Walden Field made aware and wants pt to take half a Benadryl and let us know if it happens again. Labs and UA reviewed with Dr. Walden Field who approved patient for treatment today. Richerd H Gries tolerated Avastin and FOLFIRI infusions without incident or complaint. VSS upon completion of treatment. 5FU infusing through port via home infusion pump. Discharged self ambulatory in satisfactory condition in presence of friend.

## 2017-12-02 ENCOUNTER — Other Ambulatory Visit: Payer: Self-pay

## 2017-12-02 ENCOUNTER — Encounter (HOSPITAL_COMMUNITY): Payer: Self-pay

## 2017-12-02 ENCOUNTER — Inpatient Hospital Stay (HOSPITAL_COMMUNITY): Payer: Medicare Other

## 2017-12-02 VITALS — BP 110/63 | HR 51 | Temp 97.9°F | Resp 18

## 2017-12-02 DIAGNOSIS — Z95828 Presence of other vascular implants and grafts: Secondary | ICD-10-CM

## 2017-12-02 DIAGNOSIS — C184 Malignant neoplasm of transverse colon: Secondary | ICD-10-CM

## 2017-12-02 DIAGNOSIS — Z5111 Encounter for antineoplastic chemotherapy: Secondary | ICD-10-CM | POA: Diagnosis not present

## 2017-12-02 MED ORDER — HEPARIN SOD (PORK) LOCK FLUSH 100 UNIT/ML IV SOLN
INTRAVENOUS | Status: AC
  Filled 2017-12-02: qty 5

## 2017-12-02 MED ORDER — HEPARIN SOD (PORK) LOCK FLUSH 100 UNIT/ML IV SOLN
500.0000 [IU] | Freq: Once | INTRAVENOUS | Status: AC | PRN
  Administered 2017-12-02: 500 [IU]

## 2017-12-02 MED ORDER — SODIUM CHLORIDE 0.9% FLUSH
3.0000 mL | INTRAVENOUS | Status: DC | PRN
  Administered 2017-12-02: 10 mL
  Filled 2017-12-02: qty 10

## 2017-12-02 MED ORDER — PEGFILGRASTIM-CBQV 6 MG/0.6ML ~~LOC~~ SOSY
6.0000 mg | PREFILLED_SYRINGE | Freq: Once | SUBCUTANEOUS | Status: AC
  Administered 2017-12-02: 6 mg via SUBCUTANEOUS

## 2017-12-02 MED ORDER — PEGFILGRASTIM-CBQV 6 MG/0.6ML ~~LOC~~ SOSY
PREFILLED_SYRINGE | SUBCUTANEOUS | Status: AC
  Filled 2017-12-02: qty 0.6

## 2017-12-02 NOTE — Progress Notes (Signed)
Pt here today for pump D/C and Udenyca injection. Port flushed and de-accessed. Pt given Udenyca injection in right lower abdomen. Pt tolerated both well with no complaints. Pt to return as scheduled.

## 2017-12-14 ENCOUNTER — Inpatient Hospital Stay (HOSPITAL_BASED_OUTPATIENT_CLINIC_OR_DEPARTMENT_OTHER): Payer: Medicare Other | Admitting: Internal Medicine

## 2017-12-14 ENCOUNTER — Inpatient Hospital Stay (HOSPITAL_COMMUNITY): Payer: Medicare Other

## 2017-12-14 ENCOUNTER — Other Ambulatory Visit: Payer: Self-pay

## 2017-12-14 ENCOUNTER — Encounter (HOSPITAL_COMMUNITY): Payer: Self-pay | Admitting: Internal Medicine

## 2017-12-14 VITALS — BP 115/54 | HR 50 | Temp 97.7°F | Resp 18

## 2017-12-14 VITALS — BP 121/65 | HR 65 | Temp 97.7°F | Resp 18 | Wt 190.5 lb

## 2017-12-14 DIAGNOSIS — C184 Malignant neoplasm of transverse colon: Secondary | ICD-10-CM | POA: Diagnosis not present

## 2017-12-14 DIAGNOSIS — Z5111 Encounter for antineoplastic chemotherapy: Secondary | ICD-10-CM | POA: Diagnosis not present

## 2017-12-14 DIAGNOSIS — D7589 Other specified diseases of blood and blood-forming organs: Secondary | ICD-10-CM | POA: Diagnosis not present

## 2017-12-14 DIAGNOSIS — I10 Essential (primary) hypertension: Secondary | ICD-10-CM

## 2017-12-14 DIAGNOSIS — Z79899 Other long term (current) drug therapy: Secondary | ICD-10-CM

## 2017-12-14 DIAGNOSIS — D508 Other iron deficiency anemias: Secondary | ICD-10-CM

## 2017-12-14 DIAGNOSIS — I8001 Phlebitis and thrombophlebitis of superficial vessels of right lower extremity: Secondary | ICD-10-CM | POA: Diagnosis not present

## 2017-12-14 DIAGNOSIS — Z95828 Presence of other vascular implants and grafts: Secondary | ICD-10-CM

## 2017-12-14 LAB — CBC WITH DIFFERENTIAL/PLATELET
Basophils Absolute: 0.1 10*3/uL (ref 0.0–0.1)
Basophils Relative: 1 %
Eosinophils Absolute: 0.2 10*3/uL (ref 0.0–0.7)
Eosinophils Relative: 2 %
HCT: 36.9 % — ABNORMAL LOW (ref 39.0–52.0)
Hemoglobin: 11.8 g/dL — ABNORMAL LOW (ref 13.0–17.0)
Lymphocytes Relative: 31 %
Lymphs Abs: 3.3 10*3/uL (ref 0.7–4.0)
MCH: 33.8 pg (ref 26.0–34.0)
MCHC: 32 g/dL (ref 30.0–36.0)
MCV: 105.7 fL — ABNORMAL HIGH (ref 78.0–100.0)
Monocytes Absolute: 1.3 10*3/uL — ABNORMAL HIGH (ref 0.1–1.0)
Monocytes Relative: 12 %
Neutro Abs: 5.9 10*3/uL (ref 1.7–7.7)
Neutrophils Relative %: 54 %
Platelets: 236 10*3/uL (ref 150–400)
RBC: 3.49 MIL/uL — ABNORMAL LOW (ref 4.22–5.81)
RDW: 15.9 % — ABNORMAL HIGH (ref 11.5–15.5)
WBC: 10.9 10*3/uL — ABNORMAL HIGH (ref 4.0–10.5)

## 2017-12-14 LAB — COMPREHENSIVE METABOLIC PANEL
ALT: 17 U/L (ref 0–44)
AST: 26 U/L (ref 15–41)
Albumin: 3.7 g/dL (ref 3.5–5.0)
Alkaline Phosphatase: 69 U/L (ref 38–126)
Anion gap: 9 (ref 5–15)
BUN: 13 mg/dL (ref 8–23)
CO2: 25 mmol/L (ref 22–32)
Calcium: 9 mg/dL (ref 8.9–10.3)
Chloride: 109 mmol/L (ref 98–111)
Creatinine, Ser: 1.13 mg/dL (ref 0.61–1.24)
GFR calc Af Amer: >60 mL/min (ref >60)
GFR calc non Af Amer: 58 mL/min — ABNORMAL LOW (ref >60)
Glucose, Bld: 174 mg/dL — ABNORMAL HIGH (ref 70–99)
Potassium: 4.4 mmol/L (ref 3.5–5.1)
Sodium: 143 mmol/L (ref 135–145)
Total Bilirubin: 0.6 mg/dL (ref 0.3–1.2)
Total Protein: 6.7 g/dL (ref 6.5–8.1)

## 2017-12-14 LAB — URINALYSIS, DIPSTICK ONLY
Bilirubin Urine: NEGATIVE
Glucose, UA: NEGATIVE mg/dL
Hgb urine dipstick: NEGATIVE
Ketones, ur: NEGATIVE mg/dL
Leukocytes, UA: NEGATIVE
Nitrite: NEGATIVE
Protein, ur: NEGATIVE mg/dL
Specific Gravity, Urine: 1.02 (ref 1.005–1.030)
pH: 5 (ref 5.0–8.0)

## 2017-12-14 LAB — FERRITIN: Ferritin: 266 ng/mL (ref 24–336)

## 2017-12-14 LAB — VITAMIN B12: Vitamin B-12: 3060 pg/mL — ABNORMAL HIGH (ref 180–914)

## 2017-12-14 LAB — FOLATE: Folate: 56.8 ng/mL (ref >5.9)

## 2017-12-14 LAB — LACTATE DEHYDROGENASE: LDH: 201 U/L — ABNORMAL HIGH (ref 98–192)

## 2017-12-14 MED ORDER — PALONOSETRON HCL INJECTION 0.25 MG/5ML
0.2500 mg | Freq: Once | INTRAVENOUS | Status: AC
  Administered 2017-12-14: 0.25 mg via INTRAVENOUS
  Filled 2017-12-14: qty 5

## 2017-12-14 MED ORDER — SODIUM CHLORIDE 0.9% FLUSH
10.0000 mL | INTRAVENOUS | Status: DC | PRN
  Administered 2017-12-14: 10 mL
  Filled 2017-12-14: qty 10

## 2017-12-14 MED ORDER — SODIUM CHLORIDE 0.9 % IV SOLN
5.0000 mg/kg | Freq: Once | INTRAVENOUS | Status: AC
  Administered 2017-12-14: 450 mg via INTRAVENOUS
  Filled 2017-12-14: qty 16

## 2017-12-14 MED ORDER — ATROPINE SULFATE 1 MG/ML IJ SOLN
0.5000 mg | Freq: Once | INTRAMUSCULAR | Status: AC
  Administered 2017-12-14: 0.5 mg via INTRAVENOUS
  Filled 2017-12-14: qty 1

## 2017-12-14 MED ORDER — SODIUM CHLORIDE 0.9 % IV SOLN
Freq: Once | INTRAVENOUS | Status: AC
  Administered 2017-12-14: 10:00:00 via INTRAVENOUS

## 2017-12-14 MED ORDER — LEUCOVORIN CALCIUM INJECTION 350 MG
321.0000 mg/m2 | Freq: Once | INTRAVENOUS | Status: AC
  Administered 2017-12-14: 700 mg via INTRAVENOUS
  Filled 2017-12-14: qty 35

## 2017-12-14 MED ORDER — HEPARIN SOD (PORK) LOCK FLUSH 100 UNIT/ML IV SOLN: 500.0000 [IU] | Freq: Once | INTRAVENOUS | Status: DC | PRN

## 2017-12-14 MED ORDER — SODIUM CHLORIDE 0.9 % IV SOLN
10.0000 mg | Freq: Once | INTRAVENOUS | Status: AC
  Administered 2017-12-14: 10 mg via INTRAVENOUS
  Filled 2017-12-14: qty 1

## 2017-12-14 MED ORDER — SODIUM CHLORIDE 0.9 % IV SOLN
INTRAVENOUS | Status: DC
  Administered 2017-12-14: 11:00:00 via INTRAVENOUS

## 2017-12-14 MED ORDER — SODIUM CHLORIDE 0.9 % IV SOLN
1920.0000 mg/m2 | INTRAVENOUS | Status: DC
  Administered 2017-12-14: 4200 mg via INTRAVENOUS
  Filled 2017-12-14: qty 84

## 2017-12-14 MED ORDER — IRINOTECAN HCL CHEMO INJECTION 100 MG/5ML
144.0000 mg/m2 | Freq: Once | INTRAVENOUS | Status: AC
  Administered 2017-12-14: 320 mg via INTRAVENOUS
  Filled 2017-12-14: qty 15

## 2017-12-14 MED ORDER — FLUOROURACIL CHEMO INJECTION 2.5 GM/50ML
320.0000 mg/m2 | Freq: Once | INTRAVENOUS | Status: AC
  Administered 2017-12-14: 700 mg via INTRAVENOUS
  Filled 2017-12-14: qty 14

## 2017-12-14 NOTE — Progress Notes (Signed)
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[place]) - Plan: CBC with Differential/Platelet, Comprehensive metabolic panel, Lactate dehydrogenase, Urinalysis, dipstick only, DISCONTINUED: 0.9 %  sodium chloride infusion, DISCONTINUED: sodium chloride flush (NS) 0.9 % injection 10 mL, DISCONTINUED: heparin lock flush 100 unit/mL, DISCONTINUED: atropine injection 0.5 mg, DISCONTINUED: palonosetron (ALOXI) injection 0.25 mg, DISCONTINUED: dexamethasone (DECADRON) 10 mg in sodium chloride 0.9 % 50 mL IVPB, DISCONTINUED: bevacizumab (AVASTIN) 450 mg in sodium chloride 0.9 % 100 mL chemo infusion, DISCONTINUED: irinotecan (CAMPTOSAR) 320 mg in dextrose 5 % 500 mL chemo infusion, DISCONTINUED: leucovorin 700 mg in dextrose 5 % 250 mL infusion, DISCONTINUED: fluorouracil (ADRUCIL) chemo injection 700 mg, DISCONTINUED: fluorouracil (ADRUCIL) 4,200 mg in sodium chloride 0.9 % 66 mL chemo infusion  Staging Cancer Staging 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HCC) Staging form: Colon and Rectum, AJCC 8th Edition - Clinical: cT3, cN1a, cM1 - Signed by Twana First, MD on 03/09/2017 - Pathologic: No stage assigned - Unsigned   Assessment and Plan:  1.  Stage IV colon cancer (pT3 pN1 M1) MSI -High: -K-ras mutation negative, BRAF V600E positive, PIK3CA, CDKN2A,TP53  84 y.o. Male treated with immunotherapy with nivolumab for diagnosis of colon cancer under the direction of Dr Lebron Conners.  He had been on Nivo since 02/23/2017.    Due to progression on .PET scan done 07/20/2017,Pt  was presented options of therapy with Folfiri and Avastin.   Pt will be treated with 84f 400 mg/m2 with 5 fu CIVI 2400 mg/m2 over 46 hours, Leucovorin 400 mg/m2, Irinotecan 180 mg/m2 with Avastin 5 mg/kg  Every 2 weeks.  Side effects of the medication were reviewed with pt including primarily bleeding and diarrhea.   CT CAP done 10/12/2017 showed   IMPRESSION: 1. Stable scattered mediastinal lymph nodes. No new or  progressive findings. 2. No findings for pulmonary metastatic disease. 3. No hepatic metastatic disease. 4. The left-sided omental nodule has decreased in size since the prior PET-CT. 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 5. Small scattered retroperitoneal and pelvic lymph nodes. No new or progressive findings. 6. Prostate gland enlargement, stable. 7. Cholelithiasis.  He is here for evaluation prior to C10 Folfiri and Avastin.  Labs done 12/14/2017 reviewed and showed WBC 10.9 HB 11.8 plts 236,000.  Chemistries WNL with K+ 4.4 Cr 1.13 and normal LFTs.  UA negative for protein.  He will proceed with C10 today and will continue treatment every 2 weeks.  He will be seen for follow-up in 4 weeks with labs. He will be set up for repeat imaging with CT CAP in 01/2018.    2.  Diarrhea.  He reports 1 isolated case 1 week ago.  Pt advised to use antidiarrheal medications as recommended.  If worsening of symptoms will dose reduce irinotecan.    3.  Macrocytosis.  B12 elevated.  Awaiting results of MMA.  HB 11.8.    4.  Superficial thrombophlebitis.  RLE doppler done 11/02/2017 reviewed and showed IMPRESSION: 1.  No evidence of right lower extremity deep vein thrombosis. 2. Superficial thrombophlebitis of the right great saphenous vein in the calf.  Aleve prn pain.    5.  IDA.  Patient has been treated with IV iron in the past. HB 11.8.  Ferritin 266.  Will continue to monitor.    6.  Hypertension.  BP is 121/65.  Follow-up with PCP.    7.  Family history of GI cancers.  He reports this occurred  in his brother.  Pt has  MSI High, BRAF + tumor.    30 minutes spent with more than 50% spent in counseling and coordination of care.    Interval History:   84 y.o. Treated with nivolumab for diagnosis of recurrent colon cancer under the direction of Dr Lebron Conners.   Pt now on Folfiri and Avastin.    Current Status:  Pt is seen today  for follow-up prior to C1 of Folfiri and Avastin.  He reports 1 episode of diarrhea a week ago on Sunday.      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[place])   07/11/2016 Initial Diagnosis    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[place])    07/26/2017 -  Chemotherapy    The patient had palonosetron (ALOXI) injection 0.25 mg, 0.25 mg, Intravenous,  Once, 11 of 12 cycles Administration: 0.25 mg (07/27/2017), 0.25 mg (08/10/2017), 0.25 mg (08/24/2017), 0.25 mg (09/07/2017), 0.25 mg (09/21/2017), 0.25 mg (10/05/2017), 0.25 mg (10/19/2017), 0.25 mg (11/02/2017), 0.25 mg (11/16/2017), 0.25 mg (11/30/2017), 0.25 mg (12/14/2017) pegfilgrastim-cbqv (UDENYCA) injection 6 mg, 6 mg, Subcutaneous, Once, 6 of 7 cycles Administration: 6 mg (10/07/2017), 6 mg (10/21/2017), 6 mg (11/04/2017), 6 mg (11/18/2017), 6 mg (12/02/2017) bevacizumab (AVASTIN) 450 mg in sodium chloride 0.9 % 100 mL chemo infusion, 5 mg/kg = 450 mg, Intravenous,  Once, 11 of 12 cycles Dose modification: 4 mg/kg (80 % of original dose 5 mg/kg, Cycle 5, Reason: Provider Judgment) Administration: 450 mg (07/27/2017), 450 mg (08/10/2017), 450 mg (08/24/2017), 450 mg (09/07/2017), 450 mg (09/21/2017), 450 mg (10/05/2017), 450 mg (10/19/2017), 450 mg (11/02/2017), 450 mg (11/16/2017), 450 mg (11/30/2017), 450 mg (12/14/2017) irinotecan (CAMPTOSAR) 400 mg in dextrose 5 % 500 mL chemo infusion, 180 mg/m2 = 400 mg, Intravenous,  Once, 11 of 12 cycles Dose modification: 144 mg/m2 (80 % of original dose 180 mg/m2, Cycle 5, Reason: Provider Judgment) Administration: 400 mg (07/27/2017), 400 mg (08/10/2017), 400 mg (08/24/2017), 400 mg (09/07/2017), 320 mg (09/21/2017), 320 mg (10/05/2017), 320 mg (10/19/2017), 320 mg (11/02/2017), 320 mg (11/16/2017), 320 mg (11/30/2017), 320 mg (12/14/2017) leucovorin 800 mg in dextrose 5 % 250 mL infusion, 872 mg, Intravenous,  Once, 11 of 12 cycles Dose modification: 320 mg/m2 (80 % of original dose 400 mg/m2, Cycle 5, Reason: Provider Judgment) Administration: 800 mg  (07/27/2017), 800 mg (08/10/2017), 800 mg (08/24/2017), 800 mg (09/07/2017), 700 mg (09/21/2017), 700 mg (10/05/2017), 700 mg (10/19/2017), 700 mg (11/02/2017), 700 mg (11/16/2017), 700 mg (11/30/2017), 700 mg (12/14/2017) fluorouracil (ADRUCIL) chemo injection 850 mg, 400 mg/m2 = 850 mg, Intravenous,  Once, 11 of 12 cycles Dose modification: 320 mg/m2 (80 % of original dose 400 mg/m2, Cycle 5, Reason: Provider Judgment) Administration: 850 mg (07/27/2017), 850 mg (08/10/2017), 850 mg (08/24/2017), 850 mg (09/07/2017), 700 mg (09/21/2017), 700 mg (10/05/2017), 700 mg (10/19/2017), 700 mg (11/02/2017), 700 mg (11/16/2017), 700 mg (11/30/2017), 700 mg (12/14/2017) fluorouracil (ADRUCIL) 5,250 mg in sodium chloride 0.9 % 145 mL chemo infusion, 2,400 mg/m2 = 5,250 mg, Intravenous, 1 Day/Dose, 11 of 12 cycles Dose modification: 1,920 mg/m2 (80 % of original dose 2,400 mg/m2, Cycle 5, Reason: Provider Judgment) Administration: 5,250 mg (07/27/2017), 5,250 mg (08/10/2017), 5,250 mg (08/24/2017), 5,250 mg (09/07/2017), 4,200 mg (09/21/2017), 4,200 mg (10/05/2017), 4,200 mg (10/19/2017), 4,200 mg (11/02/2017), 4,200 mg (11/16/2017), 4,200 mg (11/30/2017), 4,200 mg (12/14/2017)  for chemotherapy treatment.       Problem List Patient Active Problem List   Diagnosis Date Noted  . Port-A-Cath in place [facility] 05/18/2017  .  Iron deficiency anemia [D50.9] 09/07/2016  . S/P partial colectomy [Z90.49] 07/11/2016  . 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[place]) [C18.4]   . High cholesterol [E78.00] 04/22/2016  . H/O aortic valve replacement [Z95.2] 04/22/2016  . Diabetes ([place]) [E11.9] 04/22/2016  . Chronic pulmonary embolism ([place]) [I27.82] 04/22/2016  . Rectal bleeding [K62.5] 04/22/2016    Past Medical History Past Medical History:  Diagnosis Date  . Chronic pulmonary embolism ([place]) 35/0093   compliction of the valve replacement surgery  . COPD (chronic obstructive pulmonary disease) ([place])   . Diabetes ([place]) 04/22/2016  . H/O aortic valve  replacement 10/2008  . Hypercholesteremia   . Hypertension   . Pulmonary emboli ([place]) 10/2008  . Sleep apnea     Past Surgical History Past Surgical History:  Procedure Laterality Date  . AORTIC VALVE REPLACEMENT     2010  . CARDIAC CATHETERIZATION  2010  . COLONOSCOPY N/A 06/05/2016   Procedure: COLONOSCOPY;  Surgeon: Rogene Houston, MD;  Location: [facility] ENDO SUITE;  Service: Endoscopy;  Laterality: N/A;  . PARTIAL COLECTOMY N/A 07/11/2016   Procedure: PARTIAL COLECTOMY;  Surgeon: Aviva Signs, MD;  Location: [facility] ORS;  Service: General;  Laterality: N/A;  . PORTACATH PLACEMENT N/A 08/13/2016   Procedure: INSERTION PORT-A-CATH LEFT SUBCLAVIAN;  Surgeon: Aviva Signs, MD;  Location: [facility] ORS;  Service: General;  Laterality: N/A;  . REPLACEMENT TOTAL KNEE     1996  . spenectomy     from trauma    Family History Family History  Problem Relation Age of Onset  . Colon cancer Neg Hx      Social History  reports that he quit smoking about 55 years ago. His smoking use included cigarettes. He quit after 15.00 years of use. He has never used smokeless tobacco. He reports that he does not drink alcohol or use drugs.  Medications  Current Outpatient Medications:  .  atorvastatin (LIPITOR) 10 MG tablet, Take 10 mg by mouth at bedtime. , Disp: , Rfl:  .  Cholecalciferol (VITAMIN D3) 5000 units CAPS, Take 5,000 Units by mouth daily. , Disp: , Rfl:  .  diphenoxylate-atropine (LOMOTIL) 2.5-0.025 MG tablet, Take 1 tablet by mouth 4 (four) times daily as needed for diarrhea or loose stools., Disp: 60 tablet, Rfl: 3 .  finasteride (PROSCAR) 5 MG tablet, Take 5 mg by mouth at bedtime. , Disp: , Rfl:  .  gabapentin (NEURONTIN) 600 MG tablet, Take 1 tablet (600 mg total) by mouth 3 (three) times daily., Disp: 90 tablet, Rfl: 2 .  lisinopril (PRINIVIL,ZESTRIL) 5 MG tablet, Take 5 mg by mouth at bedtime. , Disp: , Rfl:  .  metFORMIN (GLUCOPHAGE) 500 MG tablet, Take 500 mg by mouth 2 (two) times daily.,  Disp: , Rfl:  .  metoprolol (LOPRESSOR) 50 MG tablet, Take 50 mg by mouth 2 (two) times daily., Disp: , Rfl:  .  vitamin B-12 (CYANOCOBALAMIN) 1000 MCG tablet, Take 1,000 mcg by mouth daily. , Disp: , Rfl:  No current facility-administered medications for this visit.   Facility-Administered Medications Ordered in Other Visits:  .  0.9 %  sodium chloride infusion, , Intravenous, Continuous, Florencia Zaccaro, Mathis Dad, MD, Stopped at 12/14/17 1322 .  fluorouracil (ADRUCIL) 4,200 mg in sodium chloride 0.9 % 66 mL chemo infusion, 1,920 mg/m2 (Order-Specific), Intravenous, 1 day or 1 dose, Royanne Warshaw, MD, 4,200 mg at 12/14/17 1327 .  heparin lock flush 100 unit/mL, 500 Units, Intracatheter, Once PRN, Murielle Stang, MD .  sodium chloride flush (NS) 0.9 %  injection 10 mL, 10 mL, Intracatheter, PRN, Nahom Carfagno, MD, 10 mL at 12/14/17 0949  Allergies Amoxicillin and Tamsulosin hcl  Review of Systems Review of Systems - Oncology ROS negative other than diarrhea 1 week ago.     Physical Exam  Vitals Wt Readings from Last 3 Encounters:  12/14/17 190 lb 8 oz (86.4 kg)  11/30/17 192 lb (87.1 kg)  11/16/17 196 lb 8 oz (89.1 kg)   Temp Readings from Last 3 Encounters:  12/14/17 97.7 F (36.5 C) (Oral)  12/14/17 97.7 F (36.5 C) (Oral)  12/02/17 97.9 F (36.6 C) (Oral)   BP Readings from Last 3 Encounters:  12/14/17 121/65  12/14/17 (!) 115/54  12/02/17 110/63   Pulse Readings from Last 3 Encounters:  12/14/17 65  12/14/17 (!) 50  12/02/17 (!) 51   Constitutional: Well-developed, well-nourished, and in no distress.   HENT: Head: Normocephalic and atraumatic.  Mouth/Throat: No oropharyngeal exudate. Mucosa moist. Eyes: Pupils are equal, round, and reactive to light. Conjunctivae are normal. No scleral icterus.  Neck: Normal range of motion. Neck supple. No JVD present.  Cardiovascular: Normal rate, regular rhythm and normal heart sounds.  Exam reveals no gallop and no friction rub.   No murmur  heard. Pulmonary/Chest: Effort normal and breath sounds normal. No respiratory distress. No wheezes.No rales.  Abdominal: Soft. Bowel sounds are normal. No distension. There is no tenderness. There is no guarding.  Musculoskeletal: No edema or tenderness.  Lymphadenopathy: No cervical, axillary or supraclavicular adenopathy.  Neurological: Alert and oriented to person, place, and time. No cranial nerve deficit.  Skin: Skin is warm and dry. No rash noted. No erythema. No pallor.  Psychiatric: Affect and judgment normal.   Labs Appointment on 12/14/2017  Component Date Value Ref Range Status  . WBC 12/14/2017 10.9* 4.0 - 10.5 K/uL Final  . RBC 12/14/2017 3.49* 4.22 - 5.81 MIL/uL Final  . Hemoglobin 12/14/2017 11.8* 13.0 - 17.0 g/dL Final  . HCT 12/14/2017 36.9* 39.0 - 52.0 % Final  . MCV 12/14/2017 105.7* 78.0 - 100.0 fL Final  . MCH 12/14/2017 33.8  26.0 - 34.0 pg Final  . MCHC 12/14/2017 32.0  30.0 - 36.0 g/dL Final  . RDW 12/14/2017 15.9* 11.5 - 15.5 % Final  . Platelets 12/14/2017 236  150 - 400 K/uL Final  . Neutrophils Relative % 12/14/2017 54  % Final  . Neutro Abs 12/14/2017 5.9  1.7 - 7.7 K/uL Final  . Lymphocytes Relative 12/14/2017 31  % Final  . Lymphs Abs 12/14/2017 3.3  0.7 - 4.0 K/uL Final  . Monocytes Relative 12/14/2017 12  % Final  . Monocytes Absolute 12/14/2017 1.3* 0.1 - 1.0 K/uL Final  . Eosinophils Relative 12/14/2017 2  % Final  . Eosinophils Absolute 12/14/2017 0.2  0.0 - 0.7 K/uL Final  . Basophils Relative 12/14/2017 1  % Final  . Basophils Absolute 12/14/2017 0.1  0.0 - 0.1 K/uL Final   Performed at [facility], [address], [place], [place] [address]  . Sodium 12/14/2017 143  135 - 145 mmol/L Final  . Potassium 12/14/2017 4.4  3.5 - 5.1 mmol/L Final  . Chloride 12/14/2017 109  98 - 111 mmol/L Final  . CO2 12/14/2017 25  22 - 32 mmol/L Final  . Glucose, Bld 12/14/2017 174* 70 - 99 mg/dL Final  . BUN 12/14/2017 13  8 - 23 mg/dL Final  . Creatinine,  Ser 12/14/2017 1.13  0.61 - 1.24 mg/dL Final  . Calcium 12/14/2017 9.0  8.9 - 10.3 mg/dL Final  . Total Protein 12/14/2017 6.7  6.5 - 8.1 g/dL Final  . Albumin 12/14/2017 3.7  3.5 - 5.0 g/dL Final  . AST 12/14/2017 26  15 - 41 U/L Final  . ALT 12/14/2017 17  0 - 44 U/L Final  . Alkaline Phosphatase 12/14/2017 69  38 - 126 U/L Final  . Total Bilirubin 12/14/2017 0.6  0.3 - 1.2 mg/dL Final  . GFR calc non Af Amer 12/14/2017 58* >60 mL/min Final  . GFR calc Af Amer 12/14/2017 >60  >60 mL/min Final   Comment: (NOTE) The eGFR has been calculated using the CKD EPI equation. This calculation has not been validated in all clinical situations. eGFR's persistently <60 mL/min signify possible Chronic Kidney Disease.   Georgiann Hahn gap 12/14/2017 9  5 - 15 Final   Performed at [facility], [address], [place], [place] [address]  . LDH 12/14/2017 201* 98 - 192 U/L Final   Performed at [facility], [address] [place], [place] [address]  . Color, Urine 12/14/2017 YELLOW  YELLOW Final  . APPearance 12/14/2017 CLEAR  CLEAR Final  . Specific Gravity, Urine 12/14/2017 1.020  1.005 - 1.030 Final  . pH 12/14/2017 5.0  5.0 - 8.0 Final  . Glucose, UA 12/14/2017 NEGATIVE  NEGATIVE mg/dL Final  . Hgb urine dipstick 12/14/2017 NEGATIVE  NEGATIVE Final  . Bilirubin Urine 12/14/2017 NEGATIVE  NEGATIVE Final  . Ketones, ur 12/14/2017 NEGATIVE  NEGATIVE mg/dL Final  . Protein, ur 12/14/2017 NEGATIVE  NEGATIVE mg/dL Final  . Nitrite 12/14/2017 NEGATIVE  NEGATIVE Final  . Leukocytes, UA 12/14/2017 NEGATIVE  NEGATIVE Final   Performed at [facility], [address], [place], [place] [address]  . Vitamin B-12 12/14/2017 3,060* 180 - 914 pg/mL Final   Comment: RESULTS CONFIRMED BY MANUAL DILUTION Performed at [facility], [address], [place], [place] [address]   . Folate 12/14/2017 56.8  >5.9 ng/mL Final   Comment: RESULTS CONFIRMED BY MANUAL DILUTION Performed at [facility], [address]  [place], [place] [address]   . Ferritin 12/14/2017 266  24 - 336 ng/mL Final   Performed at [facility], [address], [place],  [address]     Pathology Orders Placed This Encounter  Procedures  . CBC with Differential/Platelet    Standing Status:   Future    Standing Expiration Date:   12/15/2018  . Comprehensive metabolic panel    Standing Status:   Future    Standing Expiration Date:   12/15/2018  . Lactate dehydrogenase    Standing Status:   Future    Standing Expiration Date:   12/15/2018  . Urinalysis, dipstick only    Standing Status:   Future    Standing Expiration Date:   12/15/2018       Zoila Shutter MD

## 2017-12-14 NOTE — Patient Instructions (Signed)
[place] Discharge Instructions for Patients Receiving Chemotherapy  Today you received the following chemotherapy agents avastin, irinotecan, leucovorin, adruicil.    If you develop nausea and vomiting that is not controlled by your nausea medication, call the clinic.   BELOW ARE SYMPTOMS THAT SHOULD BE REPORTED IMMEDIATELY:  *FEVER GREATER THAN 100.5 F  *CHILLS WITH OR WITHOUT FEVER  NAUSEA AND VOMITING THAT IS NOT CONTROLLED WITH YOUR NAUSEA MEDICATION  *UNUSUAL SHORTNESS OF BREATH  *UNUSUAL BRUISING OR BLEEDING  TENDERNESS IN MOUTH AND THROAT WITH OR WITHOUT PRESENCE OF ULCERS  *URINARY PROBLEMS  *BOWEL PROBLEMS  UNUSUAL RASH Items with * indicate a potential emergency and should be followed up as soon as possible.  Feel free to call the clinic should you have any questions or concerns. The clinic phone number is [phone].  Please show the [place] at check-in to the Emergency Department and triage nurse.

## 2017-12-14 NOTE — Progress Notes (Signed)
Patient seen by Dr. Walden Field with lab review and ok to treat today.  Patient to treatment area with no new complaints.  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  Able to manage larger objects but difficulty with small things with fingers.  Friend at side.  No s/s of distress noted.   Patient tolerated chemotherapy with no complaints voiced.  Port site clean and dry.  Chemo pump connected with no alarms.  VSS with discharge and left ambulatory with no s/s of distress noted.

## 2017-12-16 ENCOUNTER — Inpatient Hospital Stay (HOSPITAL_COMMUNITY): Payer: Medicare Other

## 2017-12-16 VITALS — BP 119/63 | HR 54 | Temp 98.6°F | Resp 16

## 2017-12-16 DIAGNOSIS — Z5111 Encounter for antineoplastic chemotherapy: Secondary | ICD-10-CM | POA: Diagnosis not present

## 2017-12-16 DIAGNOSIS — C184 Malignant neoplasm of transverse colon: Secondary | ICD-10-CM

## 2017-12-16 DIAGNOSIS — Z95828 Presence of other vascular implants and grafts: Secondary | ICD-10-CM

## 2017-12-16 LAB — METHYLMALONIC ACID, SERUM: Methylmalonic Acid, Quantitative: 218 nmol/L (ref 0–378)

## 2017-12-16 MED ORDER — SODIUM CHLORIDE 0.9% FLUSH
10.0000 mL | INTRAVENOUS | Status: DC | PRN
  Administered 2017-12-16: 10 mL
  Filled 2017-12-16: qty 10

## 2017-12-16 MED ORDER — HEPARIN SOD (PORK) LOCK FLUSH 100 UNIT/ML IV SOLN
INTRAVENOUS | Status: AC
  Filled 2017-12-16: qty 5

## 2017-12-16 MED ORDER — PEGFILGRASTIM-CBQV 6 MG/0.6ML ~~LOC~~ SOSY
6.0000 mg | PREFILLED_SYRINGE | Freq: Once | SUBCUTANEOUS | Status: AC
  Administered 2017-12-16: 6 mg via SUBCUTANEOUS

## 2017-12-16 MED ORDER — HEPARIN SOD (PORK) LOCK FLUSH 100 UNIT/ML IV SOLN
500.0000 [IU] | Freq: Once | INTRAVENOUS | Status: AC | PRN
  Administered 2017-12-16: 500 [IU]

## 2017-12-16 MED ORDER — PEGFILGRASTIM-CBQV 6 MG/0.6ML ~~LOC~~ SOSY
PREFILLED_SYRINGE | SUBCUTANEOUS | Status: AC
  Filled 2017-12-16: qty 0.6

## 2017-12-16 NOTE — Progress Notes (Signed)
Lavona [place] Deshler returns today for port de access and flush after 46 hr continous infusion of 34fu. Tolerated infusion without problems. Portacath located leftchest wall was  deaccessed and flushed with 35ml NS and 500U/93ml Heparin and needle removed intact.  Procedure without incident. Patient tolerated procedure well.   Udenacya given today per orders. Patient tolerated it well without problems. Vitals stable and discharged home from clinic ambulatory. Follow up as scheduled.

## 2017-12-28 ENCOUNTER — Inpatient Hospital Stay (HOSPITAL_COMMUNITY): Payer: Medicare Other

## 2017-12-28 ENCOUNTER — Inpatient Hospital Stay (HOSPITAL_COMMUNITY): Payer: Medicare Other | Attending: Hematology

## 2017-12-28 VITALS — BP 131/62 | HR 51 | Temp 97.7°F | Resp 18 | Wt 188.8 lb

## 2017-12-28 DIAGNOSIS — Z952 Presence of prosthetic heart valve: Secondary | ICD-10-CM | POA: Insufficient documentation

## 2017-12-28 DIAGNOSIS — I2782 Chronic pulmonary embolism: Secondary | ICD-10-CM | POA: Insufficient documentation

## 2017-12-28 DIAGNOSIS — Z87891 Personal history of nicotine dependence: Secondary | ICD-10-CM | POA: Diagnosis not present

## 2017-12-28 DIAGNOSIS — E7800 Pure hypercholesterolemia, unspecified: Secondary | ICD-10-CM | POA: Insufficient documentation

## 2017-12-28 DIAGNOSIS — J449 Chronic obstructive pulmonary disease, unspecified: Secondary | ICD-10-CM | POA: Insufficient documentation

## 2017-12-28 DIAGNOSIS — Z95828 Presence of other vascular implants and grafts: Secondary | ICD-10-CM

## 2017-12-28 DIAGNOSIS — R197 Diarrhea, unspecified: Secondary | ICD-10-CM | POA: Diagnosis not present

## 2017-12-28 DIAGNOSIS — D72829 Elevated white blood cell count, unspecified: Secondary | ICD-10-CM | POA: Diagnosis not present

## 2017-12-28 DIAGNOSIS — Z79899 Other long term (current) drug therapy: Secondary | ICD-10-CM | POA: Diagnosis not present

## 2017-12-28 DIAGNOSIS — K8020 Calculus of gallbladder without cholecystitis without obstruction: Secondary | ICD-10-CM | POA: Diagnosis not present

## 2017-12-28 DIAGNOSIS — D7589 Other specified diseases of blood and blood-forming organs: Secondary | ICD-10-CM | POA: Diagnosis not present

## 2017-12-28 DIAGNOSIS — E119 Type 2 diabetes mellitus without complications: Secondary | ICD-10-CM | POA: Insufficient documentation

## 2017-12-28 DIAGNOSIS — I8001 Phlebitis and thrombophlebitis of superficial vessels of right lower extremity: Secondary | ICD-10-CM | POA: Insufficient documentation

## 2017-12-28 DIAGNOSIS — C184 Malignant neoplasm of transverse colon: Secondary | ICD-10-CM

## 2017-12-28 DIAGNOSIS — G4730 Sleep apnea, unspecified: Secondary | ICD-10-CM | POA: Diagnosis not present

## 2017-12-28 DIAGNOSIS — Z5111 Encounter for antineoplastic chemotherapy: Secondary | ICD-10-CM | POA: Insufficient documentation

## 2017-12-28 DIAGNOSIS — I10 Essential (primary) hypertension: Secondary | ICD-10-CM | POA: Diagnosis not present

## 2017-12-28 LAB — CBC WITH DIFFERENTIAL/PLATELET
Basophils Absolute: 0.1 10*3/uL (ref 0.0–0.1)
Basophils Relative: 0 %
Eosinophils Absolute: 0.2 10*3/uL (ref 0.0–0.7)
Eosinophils Relative: 1 %
HCT: 37 % — ABNORMAL LOW (ref 39.0–52.0)
Hemoglobin: 11.5 g/dL — ABNORMAL LOW (ref 13.0–17.0)
Lymphocytes Relative: 18 %
Lymphs Abs: 3.7 10*3/uL (ref 0.7–4.0)
MCH: 33.1 pg (ref 26.0–34.0)
MCHC: 31.1 g/dL (ref 30.0–36.0)
MCV: 106.6 fL — ABNORMAL HIGH (ref 78.0–100.0)
Monocytes Absolute: 2.4 10*3/uL — ABNORMAL HIGH (ref 0.1–1.0)
Monocytes Relative: 12 %
Neutro Abs: 14.2 10*3/uL (ref 1.7–7.7)
Neutrophils Relative %: 69 %
Platelets: 234 10*3/uL (ref 150–400)
RBC: 3.47 MIL/uL — ABNORMAL LOW (ref 4.22–5.81)
RDW: 14.9 % (ref 11.5–15.5)
WBC: 20.5 10*3/uL — ABNORMAL HIGH (ref 4.0–10.5)

## 2017-12-28 LAB — COMPREHENSIVE METABOLIC PANEL
ALT: 16 U/L (ref 0–44)
AST: 22 U/L (ref 15–41)
Albumin: 3.7 g/dL (ref 3.5–5.0)
Alkaline Phosphatase: 83 U/L (ref 38–126)
Anion gap: 7 (ref 5–15)
BUN: 10 mg/dL (ref 8–23)
CO2: 26 mmol/L (ref 22–32)
Calcium: 8.9 mg/dL (ref 8.9–10.3)
Chloride: 102 mmol/L (ref 98–111)
Creatinine, Ser: 0.98 mg/dL (ref 0.61–1.24)
GFR calc Af Amer: >60 mL/min (ref >60)
GFR calc non Af Amer: >60 mL/min (ref >60)
Glucose, Bld: 177 mg/dL — ABNORMAL HIGH (ref 70–99)
Potassium: 4.7 mmol/L (ref 3.5–5.1)
Sodium: 135 mmol/L (ref 135–145)
Total Bilirubin: 0.6 mg/dL (ref 0.3–1.2)
Total Protein: 6.9 g/dL (ref 6.5–8.1)

## 2017-12-28 LAB — URINALYSIS, DIPSTICK ONLY
Bilirubin Urine: NEGATIVE
Glucose, UA: 50 mg/dL — AB
Hgb urine dipstick: NEGATIVE
Ketones, ur: NEGATIVE mg/dL
Leukocytes, UA: NEGATIVE
Nitrite: NEGATIVE
Protein, ur: NEGATIVE mg/dL
Specific Gravity, Urine: 1.015 (ref 1.005–1.030)
pH: 5 (ref 5.0–8.0)

## 2017-12-28 MED ORDER — SODIUM CHLORIDE 0.9 % IV SOLN
Freq: Once | INTRAVENOUS | Status: AC
  Administered 2017-12-28: 09:00:00 via INTRAVENOUS

## 2017-12-28 MED ORDER — ATROPINE SULFATE 1 MG/ML IJ SOLN
0.5000 mg | Freq: Once | INTRAMUSCULAR | Status: AC
  Administered 2017-12-28: 0.5 mg via INTRAVENOUS
  Filled 2017-12-28: qty 1

## 2017-12-28 MED ORDER — FLUOROURACIL CHEMO INJECTION 2.5 GM/50ML
320.0000 mg/m2 | Freq: Once | INTRAVENOUS | Status: AC
  Administered 2017-12-28: 700 mg via INTRAVENOUS
  Filled 2017-12-28: qty 14

## 2017-12-28 MED ORDER — IRINOTECAN HCL CHEMO INJECTION 100 MG/5ML
144.0000 mg/m2 | Freq: Once | INTRAVENOUS | Status: AC
  Administered 2017-12-28: 320 mg via INTRAVENOUS
  Filled 2017-12-28: qty 15

## 2017-12-28 MED ORDER — SODIUM CHLORIDE 0.9 % IV SOLN
INTRAVENOUS | Status: DC
  Administered 2017-12-28: 10:00:00 via INTRAVENOUS

## 2017-12-28 MED ORDER — PALONOSETRON HCL INJECTION 0.25 MG/5ML
0.2500 mg | Freq: Once | INTRAVENOUS | Status: AC
  Administered 2017-12-28: 0.25 mg via INTRAVENOUS
  Filled 2017-12-28: qty 5

## 2017-12-28 MED ORDER — LEUCOVORIN CALCIUM INJECTION 350 MG
321.0000 mg/m2 | Freq: Once | INTRAVENOUS | Status: AC
  Administered 2017-12-28: 700 mg via INTRAVENOUS
  Filled 2017-12-28: qty 35

## 2017-12-28 MED ORDER — SODIUM CHLORIDE 0.9 % IV SOLN
5.0000 mg/kg | Freq: Once | INTRAVENOUS | Status: AC
  Administered 2017-12-28: 450 mg via INTRAVENOUS
  Filled 2017-12-28: qty 2

## 2017-12-28 MED ORDER — SODIUM CHLORIDE 0.9 % IV SOLN
10.0000 mg | Freq: Once | INTRAVENOUS | Status: AC
  Administered 2017-12-28: 10 mg via INTRAVENOUS
  Filled 2017-12-28: qty 1

## 2017-12-28 MED ORDER — SODIUM CHLORIDE 0.9 % IV SOLN
1920.0000 mg/m2 | INTRAVENOUS | Status: DC
  Administered 2017-12-28: 4200 mg via INTRAVENOUS
  Filled 2017-12-28: qty 84

## 2017-12-28 MED ORDER — SODIUM CHLORIDE 0.9% FLUSH
10.0000 mL | INTRAVENOUS | Status: DC | PRN
  Administered 2017-12-28: 10 mL
  Filled 2017-12-28: qty 10

## 2017-12-28 MED ORDER — OXYCODONE-ACETAMINOPHEN 5-325 MG PO TABS
ORAL_TABLET | ORAL | Status: AC
  Filled 2017-12-28: qty 2

## 2017-12-28 NOTE — Patient Instructions (Signed)
[facility] Discharge Instructions for Patients Receiving Chemotherapy   Beginning January 23rd 2017 lab work for the [facility] will be done in the  Main lab at [facility] on 1st floor. If you have a lab appointment with the [facility] please come in thru the  Main Entrance and check in at the main information desk   Today you received the following chemotherapy agents Avastin and FOLFIRI. Follow up as scheduled. Call clinic for any questions or concerns.  To help prevent nausea and vomiting after your treatment, we encourage you to take your nausea medication    If you develop nausea and vomiting, or diarrhea that is not controlled by your medication, call the clinic.  The clinic phone number is [phone]. Office hours are Monday-Friday 8:30am-5:00pm.  BELOW ARE SYMPTOMS THAT SHOULD BE REPORTED IMMEDIATELY:  *FEVER GREATER THAN 101.0 F  *CHILLS WITH OR WITHOUT FEVER  NAUSEA AND VOMITING THAT IS NOT CONTROLLED WITH YOUR NAUSEA MEDICATION  *UNUSUAL SHORTNESS OF BREATH  *UNUSUAL BRUISING OR BLEEDING  TENDERNESS IN MOUTH AND THROAT WITH OR WITHOUT PRESENCE OF ULCERS  *URINARY PROBLEMS  *BOWEL PROBLEMS  UNUSUAL RASH Items with * indicate a potential emergency and should be followed up as soon as possible. If you have an emergency after office hours please contact your primary care physician or go to the nearest emergency department.  Please call the clinic during office hours if you have any questions or concerns.   You may also contact the Patient Navigator at [phone] should you have any questions or need assistance in obtaining follow up care.      Resources For Cancer Patients and their Caregivers ? American Cancer Society: Can assist with transportation, wigs, general needs, runs Look Good Feel Better.        [phone] ? Cancer Care: Provides financial assistance, online support groups, medication/co-pay assistance.   1-800-813-HOPE 5106583839) ? [place] Assists [place] cancer patients and their families through emotional , educational and financial support.  (336)150-1194 ? [place] DSS Where to apply for food stamps, Medicaid and utility assistance. 405 756 8706 ? [facility]: Transportation to medical appointments. 405-101-6476 ? Social Security Administration: May apply for disability if have a Stage IV cancer. 4106614826 3185235400 ? [employer], Disability and Transit Services: Assists with nutrition, care and transit needs. [phone]

## 2017-12-28 NOTE — Progress Notes (Signed)
0920 Labs and UA reviewed and patient approved for treatment today.   Kevin Kirk tolerated Avastin and Folfiri without incident or complaint. VSS upon completion of treatment. 5FU infusing without difficulties on home infusion pump. Discharged self ambulatory in satisfactory condition in presence of friend.

## 2017-12-30 ENCOUNTER — Other Ambulatory Visit (HOSPITAL_COMMUNITY): Payer: Self-pay | Admitting: Hematology

## 2017-12-30 ENCOUNTER — Other Ambulatory Visit: Payer: Self-pay

## 2017-12-30 ENCOUNTER — Inpatient Hospital Stay (HOSPITAL_COMMUNITY): Payer: Medicare Other

## 2017-12-30 ENCOUNTER — Encounter (HOSPITAL_COMMUNITY): Payer: Self-pay

## 2017-12-30 VITALS — BP 119/54 | HR 56 | Temp 97.9°F | Resp 18

## 2017-12-30 DIAGNOSIS — Z5111 Encounter for antineoplastic chemotherapy: Secondary | ICD-10-CM | POA: Diagnosis not present

## 2017-12-30 DIAGNOSIS — C184 Malignant neoplasm of transverse colon: Secondary | ICD-10-CM

## 2017-12-30 DIAGNOSIS — Z95828 Presence of other vascular implants and grafts: Secondary | ICD-10-CM

## 2017-12-30 MED ORDER — SODIUM CHLORIDE 0.9% FLUSH
10.0000 mL | INTRAVENOUS | Status: DC | PRN
  Administered 2017-12-30: 10 mL
  Filled 2017-12-30: qty 10

## 2017-12-30 MED ORDER — HEPARIN SOD (PORK) LOCK FLUSH 100 UNIT/ML IV SOLN
500.0000 [IU] | Freq: Once | INTRAVENOUS | Status: AC | PRN
  Administered 2017-12-30: 500 [IU]

## 2017-12-30 MED ORDER — PEGFILGRASTIM-CBQV 6 MG/0.6ML ~~LOC~~ SOSY
6.0000 mg | PREFILLED_SYRINGE | Freq: Once | SUBCUTANEOUS | Status: AC
  Administered 2017-12-30: 6 mg via SUBCUTANEOUS
  Filled 2017-12-30: qty 0.6

## 2017-12-30 NOTE — Progress Notes (Signed)
Kevin Kirk presents today for injection per MD orders. Udenyca administered SQ in left Upper Arm. Administration without incident. Patient tolerated well.  Kevin Kirk presented for [employer] and flush. Portacath located left chest wall accessed with  H 20 needle. Good blood return present. Portacath flushed with 90ml NS and 500U/68ml Heparin and needle removed intact. Procedure without incident. Patient tolerated procedure well.  76fu pump removed.

## 2018-01-01 ENCOUNTER — Other Ambulatory Visit (HOSPITAL_COMMUNITY): Payer: Self-pay | Admitting: Pharmacist

## 2018-01-11 ENCOUNTER — Ambulatory Visit (HOSPITAL_COMMUNITY): Payer: Medicare Other | Admitting: Internal Medicine

## 2018-01-11 ENCOUNTER — Other Ambulatory Visit (HOSPITAL_COMMUNITY): Payer: Medicare Other

## 2018-01-11 ENCOUNTER — Ambulatory Visit (HOSPITAL_COMMUNITY): Payer: Medicare Other

## 2018-01-12 ENCOUNTER — Inpatient Hospital Stay (HOSPITAL_BASED_OUTPATIENT_CLINIC_OR_DEPARTMENT_OTHER): Payer: Medicare Other | Admitting: Internal Medicine

## 2018-01-12 ENCOUNTER — Encounter (HOSPITAL_COMMUNITY): Payer: Self-pay | Admitting: Internal Medicine

## 2018-01-12 ENCOUNTER — Inpatient Hospital Stay (HOSPITAL_COMMUNITY): Payer: Medicare Other

## 2018-01-12 ENCOUNTER — Other Ambulatory Visit: Payer: Self-pay

## 2018-01-12 VITALS — BP 121/63 | HR 62 | Temp 98.0°F | Resp 18

## 2018-01-12 VITALS — BP 136/73 | HR 65 | Temp 97.6°F | Resp 18 | Wt 186.2 lb

## 2018-01-12 DIAGNOSIS — C184 Malignant neoplasm of transverse colon: Secondary | ICD-10-CM

## 2018-01-12 DIAGNOSIS — D72829 Elevated white blood cell count, unspecified: Secondary | ICD-10-CM

## 2018-01-12 DIAGNOSIS — D7589 Other specified diseases of blood and blood-forming organs: Secondary | ICD-10-CM

## 2018-01-12 DIAGNOSIS — Z5111 Encounter for antineoplastic chemotherapy: Secondary | ICD-10-CM | POA: Diagnosis not present

## 2018-01-12 DIAGNOSIS — C189 Malignant neoplasm of colon, unspecified: Secondary | ICD-10-CM

## 2018-01-12 DIAGNOSIS — R197 Diarrhea, unspecified: Secondary | ICD-10-CM

## 2018-01-12 DIAGNOSIS — I8001 Phlebitis and thrombophlebitis of superficial vessels of right lower extremity: Secondary | ICD-10-CM

## 2018-01-12 DIAGNOSIS — Z79899 Other long term (current) drug therapy: Secondary | ICD-10-CM

## 2018-01-12 DIAGNOSIS — Z95828 Presence of other vascular implants and grafts: Secondary | ICD-10-CM

## 2018-01-12 LAB — COMPREHENSIVE METABOLIC PANEL
ALT: 16 U/L (ref 0–44)
AST: 23 U/L (ref 15–41)
Albumin: 3.5 g/dL (ref 3.5–5.0)
Alkaline Phosphatase: 80 U/L (ref 38–126)
Anion gap: 7 (ref 5–15)
BUN: 8 mg/dL (ref 8–23)
CO2: 27 mmol/L (ref 22–32)
Calcium: 8.8 mg/dL — ABNORMAL LOW (ref 8.9–10.3)
Chloride: 107 mmol/L (ref 98–111)
Creatinine, Ser: 1.08 mg/dL (ref 0.61–1.24)
GFR calc Af Amer: >60 mL/min (ref >60)
GFR calc non Af Amer: >60 mL/min (ref >60)
Glucose, Bld: 170 mg/dL — ABNORMAL HIGH (ref 70–99)
Potassium: 4.4 mmol/L (ref 3.5–5.1)
Sodium: 141 mmol/L (ref 135–145)
Total Bilirubin: 0.8 mg/dL (ref 0.3–1.2)
Total Protein: 6.6 g/dL (ref 6.5–8.1)

## 2018-01-12 LAB — CBC WITH DIFFERENTIAL/PLATELET
Abs Immature Granulocytes: 0.26 10*3/uL — ABNORMAL HIGH (ref 0.00–0.07)
Basophils Absolute: 0.1 10*3/uL (ref 0.0–0.1)
Basophils Relative: 1 %
Eosinophils Absolute: 0.2 10*3/uL (ref 0.0–0.5)
Eosinophils Relative: 1 %
HCT: 36.4 % — ABNORMAL LOW (ref 39.0–52.0)
Hemoglobin: 11.1 g/dL — ABNORMAL LOW (ref 13.0–17.0)
Immature Granulocytes: 1 %
Lymphocytes Relative: 20 %
Lymphs Abs: 3.6 10*3/uL (ref 0.7–4.0)
MCH: 32.6 pg (ref 26.0–34.0)
MCHC: 30.5 g/dL (ref 30.0–36.0)
MCV: 106.7 fL — ABNORMAL HIGH (ref 80.0–100.0)
Monocytes Absolute: 2 10*3/uL — ABNORMAL HIGH (ref 0.1–1.0)
Monocytes Relative: 11 %
Neutro Abs: 12.1 10*3/uL — ABNORMAL HIGH (ref 1.7–7.7)
Neutrophils Relative %: 66 %
Platelets: 277 10*3/uL (ref 150–400)
RBC: 3.41 MIL/uL — ABNORMAL LOW (ref 4.22–5.81)
RDW: 15.2 % (ref 11.5–15.5)
WBC: 18.2 10*3/uL — ABNORMAL HIGH (ref 4.0–10.5)
nRBC: 0.1 % (ref 0.0–0.2)

## 2018-01-12 LAB — URINALYSIS, DIPSTICK ONLY
Bilirubin Urine: NEGATIVE
Glucose, UA: 50 mg/dL — AB
Hgb urine dipstick: NEGATIVE
Ketones, ur: NEGATIVE mg/dL
Leukocytes, UA: NEGATIVE
Nitrite: NEGATIVE
Protein, ur: NEGATIVE mg/dL
Specific Gravity, Urine: 1.02 (ref 1.005–1.030)
pH: 5 (ref 5.0–8.0)

## 2018-01-12 LAB — LACTATE DEHYDROGENASE: LDH: 205 U/L — ABNORMAL HIGH (ref 98–192)

## 2018-01-12 MED ORDER — SODIUM CHLORIDE 0.9 % IV SOLN
1920.0000 mg/m2 | INTRAVENOUS | Status: DC
  Administered 2018-01-12: 4200 mg via INTRAVENOUS
  Filled 2018-01-12: qty 84

## 2018-01-12 MED ORDER — LEUCOVORIN CALCIUM INJECTION 350 MG
321.0000 mg/m2 | Freq: Once | INTRAVENOUS | Status: AC
  Administered 2018-01-12: 700 mg via INTRAVENOUS
  Filled 2018-01-12: qty 35

## 2018-01-12 MED ORDER — SODIUM CHLORIDE 0.9 % IV SOLN
INTRAVENOUS | Status: DC
  Administered 2018-01-12: 10:00:00 via INTRAVENOUS

## 2018-01-12 MED ORDER — PALONOSETRON HCL INJECTION 0.25 MG/5ML
INTRAVENOUS | Status: AC
  Filled 2018-01-12: qty 5

## 2018-01-12 MED ORDER — SODIUM CHLORIDE 0.9% FLUSH
10.0000 mL | INTRAVENOUS | Status: DC | PRN
  Administered 2018-01-12: 10 mL
  Filled 2018-01-12: qty 10

## 2018-01-12 MED ORDER — SODIUM CHLORIDE 0.9 % IV SOLN
Freq: Once | INTRAVENOUS | Status: AC
  Administered 2018-01-12: 10:00:00 via INTRAVENOUS

## 2018-01-12 MED ORDER — PALONOSETRON HCL INJECTION 0.25 MG/5ML
0.2500 mg | Freq: Once | INTRAVENOUS | Status: AC
  Administered 2018-01-12: 0.25 mg via INTRAVENOUS

## 2018-01-12 MED ORDER — SODIUM CHLORIDE 0.9 % IV SOLN
400.0000 mg | Freq: Once | INTRAVENOUS | Status: AC
  Administered 2018-01-12: 400 mg via INTRAVENOUS
  Filled 2018-01-12: qty 16

## 2018-01-12 MED ORDER — ATROPINE SULFATE 1 MG/ML IJ SOLN
0.5000 mg | Freq: Once | INTRAMUSCULAR | Status: AC
  Administered 2018-01-12: 0.5 mg via INTRAVENOUS

## 2018-01-12 MED ORDER — SODIUM CHLORIDE 0.9 % IV SOLN
10.0000 mg | Freq: Once | INTRAVENOUS | Status: AC
  Administered 2018-01-12: 10 mg via INTRAVENOUS
  Filled 2018-01-12: qty 1

## 2018-01-12 MED ORDER — IRINOTECAN HCL CHEMO INJECTION 100 MG/5ML
144.0000 mg/m2 | Freq: Once | INTRAVENOUS | Status: AC
  Administered 2018-01-12: 320 mg via INTRAVENOUS
  Filled 2018-01-12: qty 14

## 2018-01-12 MED ORDER — FLUOROURACIL CHEMO INJECTION 2.5 GM/50ML
320.0000 mg/m2 | Freq: Once | INTRAVENOUS | Status: AC
  Administered 2018-01-12: 700 mg via INTRAVENOUS
  Filled 2018-01-12: qty 14

## 2018-01-12 MED ORDER — ATROPINE SULFATE 1 MG/ML IJ SOLN
INTRAMUSCULAR | Status: AC
  Filled 2018-01-12: qty 1

## 2018-01-12 NOTE — Progress Notes (Signed)
Diagnosis Colon carcinoma ([place]) - Plan: CT CHEST W CONTRAST, CT ABDOMEN PELVIS W CONTRAST, CBC with Differential/Platelet, Comprehensive metabolic panel, Lactate dehydrogenase, Ferritin  Staging Cancer Staging Malignant neoplasm of transverse colon [facility]) Staging form: Colon and Rectum, AJCC 8th Edition - Clinical: cT3, cN1a, cM1 - Signed by Twana First, MD on 03/09/2017 - Pathologic: No stage assigned - Unsigned   Assessment and Plan:  1.  Stage IV colon cancer (pT3 pN1 M1) MSI -High: -K-ras mutation negative, BRAF V600E positive, PIK3CA, CDKN2A,TP53  84 y.o. Male treated with immunotherapy with nivolumab for diagnosis of colon cancer under the direction of Dr Lebron Conners.  He had been on Nivo since 02/23/2017.    Due to progression on .PET scan done 07/20/2017,Pt  was presented options of therapy with Folfiri and Avastin.   Pt will be treated with 51f 400 mg/m2 with 5 fu CIVI 2400 mg/m2 over 46 hours, Leucovorin 400 mg/m2, Irinotecan 180 mg/m2 with Avastin 5 mg/kg  Every 2 weeks.  Side effects of the medication were reviewed with pt including primarily bleeding and diarrhea.   CT CAP done 10/12/2017 showed   IMPRESSION: 1. Stable scattered mediastinal lymph nodes. No new or progressive findings. 2. No findings for pulmonary metastatic disease. 3. No hepatic metastatic disease. 4. The left-sided omental nodule has decreased in size since the prior PET-CT. 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 5. Small scattered retroperitoneal and pelvic lymph nodes. No new or progressive findings. 6. Prostate gland enlargement, stable. 7. Cholelithiasis.  He is here for evaluation prior to C13 Folfiri and Avastin.  Labs done 01/12/2018 reviewed and showed WBC 18.2 HB 11.1 plts 277, 000.  Chemistries WNL wit K+ 4.4 Cr 1.08 and normal LFTs.  UA negative for protein.  Pt will be set up for CT CAP in 01/2018 and will follow-up to go over  results. Pt will continue Folfiri and Avastin as directed.    2.  Diarrhea.  He reports 1 isolated case.  Pt advised to use antidiarrheal medications as recommended.   3.  Leucocytosis. WBC elevated at 18.2.  Pt afebrile.  Pt was treated with Udenyca at last cycle.  Likely due to growth factor support.    4.  Macrocytosis.  B12 elevated.  MMA levels normal.  HB 11.1.    5.  Superficial thrombophlebitis.  RLE doppler done 11/02/2017 reviewed and showed IMPRESSION: 1.  No evidence of right lower extremity deep vein thrombosis. 2. Superficial thrombophlebitis of the right great saphenous vein in the calf.  Aleve prn pain.    6.  IDA.  Patient has been treated with IV iron in the past. HB 11.1 with ferritin 266.  Will continue to monitor.    7.  Hypertension.  BP is 136/73.   Follow-up with PCP.    8.  Family history of GI cancers.  He reports this occurred in his brother.  Pt has  MSI High, BRAF + tumor.    25 minutes spent with more than 50% spent in counseling and coordination of care.    Interval History:   84y.o. Treated with nivolumab for diagnosis of recurrent colon cancer under the direction of Dr PLebron Conners   Pt now on Folfiri and Avastin.    Current Status:  Pt is seen today for follow-up prior to C13 of Folfiri and Avastin.  He reports episode of diarrhea that only lasts few days.      Malignant neoplasm  of transverse colon ([place])   07/11/2016 Initial Diagnosis    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[place])    07/26/2017 -  Chemotherapy    The patient had palonosetron (ALOXI) injection 0.25 mg, 0.25 mg, Intravenous,  Once, 13 of 15 cycles Administration: 0.25 mg (07/27/2017), 0.25 mg (08/10/2017), 0.25 mg (08/24/2017), 0.25 mg (09/07/2017), 0.25 mg (09/21/2017), 0.25 mg (10/05/2017), 0.25 mg (10/19/2017), 0.25 mg (11/02/2017), 0.25 mg (11/16/2017), 0.25 mg (11/30/2017), 0.25 mg (12/14/2017), 0.25 mg (12/28/2017) pegfilgrastim-cbqv (UDENYCA) injection 6 mg, 6 mg, Subcutaneous, Once, 8 of 10  cycles Administration: 6 mg (10/07/2017), 6 mg (10/21/2017), 6 mg (11/04/2017), 6 mg (11/18/2017), 6 mg (12/02/2017), 6 mg (12/16/2017), 6 mg (12/30/2017) bevacizumab (AVASTIN) 450 mg in sodium chloride 0.9 % 100 mL chemo infusion, 5 mg/kg = 450 mg, Intravenous,  Once, 13 of 15 cycles Dose modification: 4 mg/kg (80 % of original dose 5 mg/kg, Cycle 5, Reason: Provider Judgment) Administration: 450 mg (07/27/2017), 450 mg (08/10/2017), 450 mg (08/24/2017), 450 mg (09/07/2017), 450 mg (09/21/2017), 450 mg (10/05/2017), 450 mg (10/19/2017), 450 mg (11/02/2017), 450 mg (11/16/2017), 450 mg (11/30/2017), 450 mg (12/14/2017), 450 mg (12/28/2017) irinotecan (CAMPTOSAR) 400 mg in dextrose 5 % 500 mL chemo infusion, 180 mg/m2 = 400 mg, Intravenous,  Once, 13 of 15 cycles Dose modification: 144 mg/m2 (80 % of original dose 180 mg/m2, Cycle 5, Reason: Provider Judgment) Administration: 400 mg (07/27/2017), 400 mg (08/10/2017), 400 mg (08/24/2017), 400 mg (09/07/2017), 320 mg (09/21/2017), 320 mg (10/05/2017), 320 mg (10/19/2017), 320 mg (11/02/2017), 320 mg (11/16/2017), 320 mg (11/30/2017), 320 mg (12/14/2017), 320 mg (12/28/2017) leucovorin 800 mg in dextrose 5 % 250 mL infusion, 872 mg, Intravenous,  Once, 13 of 15 cycles Dose modification: 320 mg/m2 (80 % of original dose 400 mg/m2, Cycle 5, Reason: Provider Judgment) Administration: 800 mg (07/27/2017), 800 mg (08/10/2017), 800 mg (08/24/2017), 800 mg (09/07/2017), 700 mg (09/21/2017), 700 mg (10/05/2017), 700 mg (10/19/2017), 700 mg (11/02/2017), 700 mg (11/16/2017), 700 mg (11/30/2017), 700 mg (12/14/2017), 700 mg (12/28/2017) fluorouracil (ADRUCIL) chemo injection 850 mg, 400 mg/m2 = 850 mg, Intravenous,  Once, 13 of 15 cycles Dose modification: 320 mg/m2 (80 % of original dose 400 mg/m2, Cycle 5, Reason: Provider Judgment) Administration: 850 mg (07/27/2017), 850 mg (08/10/2017), 850 mg (08/24/2017), 850 mg (09/07/2017), 700 mg (09/21/2017), 700 mg (10/05/2017), 700 mg (10/19/2017), 700 mg (11/02/2017), 700 mg  (11/16/2017), 700 mg (11/30/2017), 700 mg (12/14/2017), 700 mg (12/28/2017) fluorouracil (ADRUCIL) 5,250 mg in sodium chloride 0.9 % 145 mL chemo infusion, 2,400 mg/m2 = 5,250 mg, Intravenous, 1 Day/Dose, 13 of 15 cycles Dose modification: 1,920 mg/m2 (80 % of original dose 2,400 mg/m2, Cycle 5, Reason: Provider Judgment) Administration: 5,250 mg (07/27/2017), 5,250 mg (08/10/2017), 5,250 mg (08/24/2017), 5,250 mg (09/07/2017), 4,200 mg (09/21/2017), 4,200 mg (10/05/2017), 4,200 mg (10/19/2017), 4,200 mg (11/02/2017), 4,200 mg (11/16/2017), 4,200 mg (11/30/2017), 4,200 mg (12/14/2017), 4,200 mg (12/28/2017)  for chemotherapy treatment.       Problem List Patient Active Problem List   Diagnosis Date Noted  . Port-A-Cath in place [facility] 05/18/2017  . Iron deficiency anemia [D50.9] 09/07/2016  . S/P partial colectomy [Z90.49] 07/11/2016  . 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[place]) [C18.4]   . High cholesterol [E78.00] 04/22/2016  . H/O aortic valve replacement [Z95.2] 04/22/2016  . Diabetes ([place]) [E11.9] 04/22/2016  . Chronic pulmonary embolism ([place]) [I27.82] 04/22/2016  . Rectal bleeding [K62.5] 04/22/2016    Past Medical History Past Medical History:  Diagnosis Date  . Chronic pulmonary embolism ([place]) 10/2008  compliction of the valve replacement surgery  . COPD (chronic obstructive pulmonary disease) ([place])   . Diabetes ([place]) 04/22/2016  . H/O aortic valve replacement 10/2008  . Hypercholesteremia   . Hypertension   . Pulmonary emboli ([place]) 10/2008  . Sleep apnea     Past Surgical History Past Surgical History:  Procedure Laterality Date  . AORTIC VALVE REPLACEMENT     2010  . CARDIAC CATHETERIZATION  2010  . COLONOSCOPY N/A 06/05/2016   Procedure: COLONOSCOPY;  Surgeon: Rogene Houston, MD;  Location: [facility] ENDO SUITE;  Service: Endoscopy;  Laterality: N/A;  . PARTIAL COLECTOMY N/A 07/11/2016   Procedure: PARTIAL COLECTOMY;  Surgeon: Aviva Signs, MD;  Location: [facility] ORS;  Service: General;   Laterality: N/A;  . PORTACATH PLACEMENT N/A 08/13/2016   Procedure: INSERTION PORT-A-CATH LEFT SUBCLAVIAN;  Surgeon: Aviva Signs, MD;  Location: [facility] ORS;  Service: General;  Laterality: N/A;  . REPLACEMENT TOTAL KNEE     1996  . spenectomy     from trauma    Family History Family History  Problem Relation Age of Onset  . Colon cancer Neg Hx      Social History  reports that he quit smoking about 55 years ago. His smoking use included cigarettes. He quit after 15.00 years of use. He has never used smokeless tobacco. He reports that he does not drink alcohol or use drugs.  Medications  Current Outpatient Medications:  .  atorvastatin (LIPITOR) 10 MG tablet, Take 10 mg by mouth at bedtime. , Disp: , Rfl:  .  Cholecalciferol (VITAMIN D3) 5000 units CAPS, Take 5,000 Units by mouth daily. , Disp: , Rfl:  .  diphenoxylate-atropine (LOMOTIL) 2.5-0.025 MG tablet, Take 1 tablet by mouth 4 (four) times daily as needed for diarrhea or loose stools., Disp: 60 tablet, Rfl: 3 .  gabapentin (NEURONTIN) 600 MG tablet, Take 1 tablet (600 mg total) by mouth 3 (three) times daily., Disp: 90 tablet, Rfl: 2 .  lisinopril (PRINIVIL,ZESTRIL) 5 MG tablet, Take 5 mg by mouth at bedtime. , Disp: , Rfl:  .  metoprolol (LOPRESSOR) 50 MG tablet, Take 50 mg by mouth 2 (two) times daily., Disp: , Rfl:  .  vitamin B-12 (CYANOCOBALAMIN) 1000 MCG tablet, Take 1,000 mcg by mouth daily. , Disp: , Rfl:  No current facility-administered medications for this visit.   Facility-Administered Medications Ordered in Other Visits:  .  0.9 %  sodium chloride infusion, , Intravenous, Continuous, Anaeli Cornwall, MD, Last Rate: 20 mL/hr at 01/12/18 1004 .  fluorouracil (ADRUCIL) 4,200 mg in sodium chloride 0.9 % 66 mL chemo infusion, 1,920 mg/m2 (Order-Specific), Intravenous, 1 day or 1 dose, Linsey Arteaga, MD .  fluorouracil (ADRUCIL) chemo injection 700 mg, 320 mg/m2 (Order-Specific), Intravenous, Once, Durwin Davisson, MD .  sodium  chloride flush (NS) 0.9 % injection 10 mL, 10 mL, Intracatheter, PRN, Bartosz Luginbill, MD, 10 mL at 01/12/18 1000  Allergies Amoxicillin and Tamsulosin hcl  Review of Systems Review of Systems - Oncology ROS negative other than diarrhea   Physical Exam  Vitals Wt Readings from Last 3 Encounters:  01/12/18 186 lb 3.2 oz (84.5 kg)  12/28/17 188 lb 12.8 oz (85.6 kg)  12/14/17 190 lb 8 oz (86.4 kg)   Temp Readings from Last 3 Encounters:  01/12/18 97.6 F (36.4 C) (Oral)  12/30/17 97.9 F (36.6 C) (Oral)  12/28/17 97.7 F (36.5 C) (Oral)   BP Readings from Last 3 Encounters:  01/12/18 136/73  12/30/17 (!) 119/54  12/28/17 131/62   Pulse Readings from Last 3 Encounters:  01/12/18 65  12/30/17 (!) 56  12/28/17 (!) 51   Constitutional: Well-developed, well-nourished, and in no distress.   HENT: Head: Normocephalic and atraumatic.  Mouth/Throat: No oropharyngeal exudate. Mucosa moist. Eyes: Pupils are equal, round, and reactive to light. Conjunctivae are normal. No scleral icterus.  Neck: Normal range of motion. Neck supple. No JVD present.  Cardiovascular: Normal rate, regular rhythm and normal heart sounds.  Exam reveals no gallop and no friction rub.   No murmur heard. Pulmonary/Chest: Effort normal and breath sounds normal. No respiratory distress. No wheezes.No rales.  Abdominal: Soft. Bowel sounds are normal. No distension. There is no tenderness. There is no guarding.  Musculoskeletal: No edema or tenderness.  Lymphadenopathy: No cervica, axillaryl or supraclavicular adenopathy.  Neurological: Alert and oriented to person, place, and time. No cranial nerve deficit.  Skin: Skin is warm and dry. No rash noted. No erythema. No pallor.  Psychiatric: Affect and judgment normal.   Labs Infusion on 01/12/2018  Component Date Value Ref Range Status  . Color, Urine 01/12/2018 YELLOW  YELLOW Final  . APPearance 01/12/2018 CLEAR  CLEAR Final  . Specific Gravity, Urine  01/12/2018 1.020  1.005 - 1.030 Final  . pH 01/12/2018 5.0  5.0 - 8.0 Final  . Glucose, UA 01/12/2018 50* NEGATIVE mg/dL Final  . Hgb urine dipstick 01/12/2018 NEGATIVE  NEGATIVE Final  . Bilirubin Urine 01/12/2018 NEGATIVE  NEGATIVE Final  . Ketones, ur 01/12/2018 NEGATIVE  NEGATIVE mg/dL Final  . Protein, ur 01/12/2018 NEGATIVE  NEGATIVE mg/dL Final  . Nitrite 01/12/2018 NEGATIVE  NEGATIVE Final  . Leukocytes, UA 01/12/2018 NEGATIVE  NEGATIVE Final   Performed at [facility], [address] [place], [place] [address]  Appointment on 01/12/2018  Component Date Value Ref Range Status  . WBC 01/12/2018 18.2* 4.0 - 10.5 K/uL Final  . RBC 01/12/2018 3.41* 4.22 - 5.81 MIL/uL Final  . Hemoglobin 01/12/2018 11.1* 13.0 - 17.0 g/dL Final  . HCT 01/12/2018 36.4* 39.0 - 52.0 % Final  . MCV 01/12/2018 106.7* 80.0 - 100.0 fL Final  . MCH 01/12/2018 32.6  26.0 - 34.0 pg Final  . MCHC 01/12/2018 30.5  30.0 - 36.0 g/dL Final  . RDW 01/12/2018 15.2  11.5 - 15.5 % Final  . Platelets 01/12/2018 277  150 - 400 K/uL Final  . nRBC 01/12/2018 0.1  0.0 - 0.2 % Final  . Neutrophils Relative % 01/12/2018 66  % Final  . Neutro Abs 01/12/2018 12.1* 1.7 - 7.7 K/uL Final  . Lymphocytes Relative 01/12/2018 20  % Final  . Lymphs Abs 01/12/2018 3.6  0.7 - 4.0 K/uL Final  . Monocytes Relative 01/12/2018 11  % Final  . Monocytes Absolute 01/12/2018 2.0* 0.1 - 1.0 K/uL Final  . Eosinophils Relative 01/12/2018 1  % Final  . Eosinophils Absolute 01/12/2018 0.2  0.0 - 0.5 K/uL Final  . Basophils Relative 01/12/2018 1  % Final  . Basophils Absolute 01/12/2018 0.1  0.0 - 0.1 K/uL Final  . Immature Granulocytes 01/12/2018 1  % Final  . Abs Immature Granulocytes 01/12/2018 0.26* 0.00 - 0.07 K/uL Final   Performed at [facility], [address] [place], [place] [address]  . Sodium 01/12/2018 141  135 - 145 mmol/L Final  . Potassium 01/12/2018 4.4  3.5 - 5.1 mmol/L Final  . Chloride 01/12/2018 107  98 - 111 mmol/L Final   . CO2 01/12/2018 27  22 - 32  mmol/L Final  . Glucose, Bld 01/12/2018 170* 70 - 99 mg/dL Final  . BUN 01/12/2018 8  8 - 23 mg/dL Final  . Creatinine, Ser 01/12/2018 1.08  0.61 - 1.24 mg/dL Final  . Calcium 01/12/2018 8.8* 8.9 - 10.3 mg/dL Final  . Total Protein 01/12/2018 6.6  6.5 - 8.1 g/dL Final  . Albumin 01/12/2018 3.5  3.5 - 5.0 g/dL Final  . AST 01/12/2018 23  15 - 41 U/L Final  . ALT 01/12/2018 16  0 - 44 U/L Final  . Alkaline Phosphatase 01/12/2018 80  38 - 126 U/L Final  . Total Bilirubin 01/12/2018 0.8  0.3 - 1.2 mg/dL Final  . GFR calc non Af Amer 01/12/2018 >60  >60 mL/min Final  . GFR calc Af Amer 01/12/2018 >60  >60 mL/min Final   Comment: (NOTE) The eGFR has been calculated using the CKD EPI equation. This calculation has not been validated in all clinical situations. eGFR's persistently <60 mL/min signify possible Chronic Kidney Disease.   Georgiann Hahn gap 01/12/2018 7  5 - 15 Final   Performed at [facility], [address], [place], [place] [address]  . LDH 01/12/2018 205* 98 - 192 U/L Final   Performed at [facility], [address] [place], [place] [address]     Pathology Orders Placed This Encounter  Procedures  . CT CHEST W CONTRAST    Standing Status:   Future    Standing Expiration Date:   01/12/2019    Order Specific Question:   If indicated for the ordered procedure, I authorize the administration of contrast media per Radiology protocol    Answer:   Yes    Order Specific Question:   Preferred imaging location?    Answer:   [facility]    Order Specific Question:   Radiology Contrast Protocol - do NOT remove file path    Answer:   [email or web address]  . CT ABDOMEN PELVIS W CONTRAST    Standing Status:   Future    Standing Expiration Date:   01/12/2019    Order Specific Question:   If indicated for the ordered procedure, I authorize the administration of contrast media per Radiology protocol    Answer:   Yes    Order  Specific Question:   Preferred imaging location?    Answer:   [facility]    Order Specific Question:   Is Oral Contrast requested for this exam?    Answer:   Yes, Per Radiology protocol    Order Specific Question:   Radiology Contrast Protocol - do NOT remove file path    Answer:   \\[email or web address]  . CBC with Differential/Platelet    Standing Status:   Future    Standing Expiration Date:   01/13/2019  . Comprehensive metabolic panel    Standing Status:   Future    Standing Expiration Date:   01/13/2019  . Lactate dehydrogenase    Standing Status:   Future    Standing Expiration Date:   01/13/2019  . Ferritin    Standing Status:   Future    Standing Expiration Date:   01/13/2019       Zoila Shutter MD

## 2018-01-12 NOTE — Patient Instructions (Signed)
[facility] Discharge Instructions for Patients Receiving Chemotherapy   Beginning January 23rd 2017 lab work for the Cancer Center will be done in the  Main lab at [place] on 1st floor. If you have a lab appointment with the Cancer Center please come in thru the  Main Entrance and check in at the main information desk   Today you received the following chemotherapy agents Avastin and FOLFIRI  To help prevent nausea and vomiting after your treatment, we encourage you to take your nausea medication   If you develop nausea and vomiting, or diarrhea that is not controlled by your medication, call the clinic.  The clinic phone number is [phone]. Office hours are Monday-Friday 8:30am-5:00pm.  BELOW ARE SYMPTOMS THAT SHOULD BE REPORTED IMMEDIATELY:  *FEVER GREATER THAN 101.0 F  *CHILLS WITH OR WITHOUT FEVER  NAUSEA AND VOMITING THAT IS NOT CONTROLLED WITH YOUR NAUSEA MEDICATION  *UNUSUAL SHORTNESS OF BREATH  *UNUSUAL BRUISING OR BLEEDING  TENDERNESS IN MOUTH AND THROAT WITH OR WITHOUT PRESENCE OF ULCERS  *URINARY PROBLEMS  *BOWEL PROBLEMS  UNUSUAL RASH Items with * indicate a potential emergency and should be followed up as soon as possible. If you have an emergency after office hours please contact your primary care physician or go to the nearest emergency department.  Please call the clinic during office hours if you have any questions or concerns.   You may also contact the Patient Navigator at [phone] should you have any questions or need assistance in obtaining follow up care.      Resources For Cancer Patients and their Caregivers ? American Cancer Society: Can assist with transportation, wigs, general needs, runs Look Good Feel Better.        1-888-227-6333 ? Cancer Care: Provides financial assistance, online support groups, medication/co-pay assistance.  1-800-813-HOPE (4673) ? [facility] Assists  [place] cancer patients and their families through emotional , educational and financial support.  336-427-4357 ? [place] DSS Where to apply for food stamps, Medicaid and utility assistance. 336-342-1394 ? RC[facility]: Transportation to medical appointments. 336-347-2287 ? Social Security Administration: May apply for disability if have a Stage IV cancer. 336-342-7796 1-800-772-1213 ? [place] Aging, Disability and Transit Services: Assists with nutrition, care and transit needs. 336-349-2343          

## 2018-01-12 NOTE — Progress Notes (Signed)
1000 labs reviewed and pt seen by Dr. Walden Field who approved pt for treatment today.  Kevin Kirk tolerated Avastin and FOLFIRI infusions without incident or complaint. 5FU infusing through port via home infusion pump without difficulties. VSS upon completion of treatment. Discharged self ambulatory in satisfactory condition in presence of friend.

## 2018-01-12 NOTE — Treatment Plan (Signed)
Dc Udenyca due to high WBC per Dr Walden Field

## 2018-01-13 ENCOUNTER — Encounter (HOSPITAL_COMMUNITY): Payer: Medicare Other

## 2018-01-14 ENCOUNTER — Other Ambulatory Visit: Payer: Self-pay

## 2018-01-14 ENCOUNTER — Encounter (HOSPITAL_COMMUNITY): Payer: Self-pay

## 2018-01-14 ENCOUNTER — Inpatient Hospital Stay (HOSPITAL_COMMUNITY): Payer: Medicare Other

## 2018-01-14 ENCOUNTER — Encounter (HOSPITAL_COMMUNITY): Payer: Medicare Other

## 2018-01-14 VITALS — BP 112/57 | HR 58 | Temp 97.2°F | Resp 20

## 2018-01-14 DIAGNOSIS — Z95828 Presence of other vascular implants and grafts: Secondary | ICD-10-CM

## 2018-01-14 DIAGNOSIS — C184 Malignant neoplasm of transverse colon: Secondary | ICD-10-CM

## 2018-01-14 DIAGNOSIS — Z5111 Encounter for antineoplastic chemotherapy: Secondary | ICD-10-CM | POA: Diagnosis not present

## 2018-01-14 MED ORDER — HEPARIN SOD (PORK) LOCK FLUSH 100 UNIT/ML IV SOLN
500.0000 [IU] | Freq: Once | INTRAVENOUS | Status: AC | PRN
  Administered 2018-01-14: 500 [IU]

## 2018-01-14 MED ORDER — SODIUM CHLORIDE 0.9% FLUSH
10.0000 mL | INTRAVENOUS | Status: DC | PRN
  Administered 2018-01-14: 10 mL
  Filled 2018-01-14: qty 10

## 2018-01-14 NOTE — Progress Notes (Signed)
Kevin Kirk returns today for port de access and flush after 46 hr continous infusion of 39fu. Tolerated infusion without problems. Portacath located left chest wall was  deaccessed and flushed with 70ml NS and 500U/72ml Heparin and needle removed intact.  Procedure without incident. Patient tolerated procedure well.  Patient not to receive Udenyca today because of high WBC per Dr. Walden Field. Patient notified.  Patient states that he has not had diarrhea this time.

## 2018-01-26 ENCOUNTER — Encounter (HOSPITAL_COMMUNITY): Payer: Self-pay

## 2018-01-26 ENCOUNTER — Inpatient Hospital Stay (HOSPITAL_COMMUNITY): Payer: Medicare Other

## 2018-01-26 ENCOUNTER — Inpatient Hospital Stay (HOSPITAL_COMMUNITY): Payer: Medicare Other | Attending: Hematology

## 2018-01-26 VITALS — BP 126/64 | HR 62 | Temp 97.8°F | Resp 18 | Wt 183.6 lb

## 2018-01-26 DIAGNOSIS — Z952 Presence of prosthetic heart valve: Secondary | ICD-10-CM | POA: Diagnosis not present

## 2018-01-26 DIAGNOSIS — G4730 Sleep apnea, unspecified: Secondary | ICD-10-CM | POA: Insufficient documentation

## 2018-01-26 DIAGNOSIS — I8001 Phlebitis and thrombophlebitis of superficial vessels of right lower extremity: Secondary | ICD-10-CM | POA: Insufficient documentation

## 2018-01-26 DIAGNOSIS — C184 Malignant neoplasm of transverse colon: Secondary | ICD-10-CM | POA: Insufficient documentation

## 2018-01-26 DIAGNOSIS — K8020 Calculus of gallbladder without cholecystitis without obstruction: Secondary | ICD-10-CM | POA: Diagnosis not present

## 2018-01-26 DIAGNOSIS — I10 Essential (primary) hypertension: Secondary | ICD-10-CM | POA: Diagnosis not present

## 2018-01-26 DIAGNOSIS — E119 Type 2 diabetes mellitus without complications: Secondary | ICD-10-CM | POA: Diagnosis not present

## 2018-01-26 DIAGNOSIS — J449 Chronic obstructive pulmonary disease, unspecified: Secondary | ICD-10-CM | POA: Insufficient documentation

## 2018-01-26 DIAGNOSIS — I2782 Chronic pulmonary embolism: Secondary | ICD-10-CM | POA: Insufficient documentation

## 2018-01-26 DIAGNOSIS — Z87891 Personal history of nicotine dependence: Secondary | ICD-10-CM | POA: Insufficient documentation

## 2018-01-26 DIAGNOSIS — Z79899 Other long term (current) drug therapy: Secondary | ICD-10-CM | POA: Insufficient documentation

## 2018-01-26 DIAGNOSIS — Z23 Encounter for immunization: Secondary | ICD-10-CM | POA: Insufficient documentation

## 2018-01-26 DIAGNOSIS — Z95828 Presence of other vascular implants and grafts: Secondary | ICD-10-CM

## 2018-01-26 DIAGNOSIS — D7589 Other specified diseases of blood and blood-forming organs: Secondary | ICD-10-CM | POA: Insufficient documentation

## 2018-01-26 DIAGNOSIS — E7800 Pure hypercholesterolemia, unspecified: Secondary | ICD-10-CM | POA: Diagnosis not present

## 2018-01-26 DIAGNOSIS — D72829 Elevated white blood cell count, unspecified: Secondary | ICD-10-CM | POA: Insufficient documentation

## 2018-01-26 DIAGNOSIS — Z5111 Encounter for antineoplastic chemotherapy: Secondary | ICD-10-CM | POA: Diagnosis not present

## 2018-01-26 DIAGNOSIS — D509 Iron deficiency anemia, unspecified: Secondary | ICD-10-CM

## 2018-01-26 LAB — URINALYSIS, DIPSTICK ONLY
Bilirubin Urine: NEGATIVE
Glucose, UA: NEGATIVE mg/dL
Hgb urine dipstick: NEGATIVE
Ketones, ur: NEGATIVE mg/dL
Leukocytes, UA: NEGATIVE
Nitrite: NEGATIVE
Protein, ur: NEGATIVE mg/dL
Specific Gravity, Urine: 1.023 (ref 1.005–1.030)
pH: 5 (ref 5.0–8.0)

## 2018-01-26 LAB — COMPREHENSIVE METABOLIC PANEL
ALT: 16 U/L (ref 0–44)
AST: 22 U/L (ref 15–41)
Albumin: 3.7 g/dL (ref 3.5–5.0)
Alkaline Phosphatase: 49 U/L (ref 38–126)
Anion gap: 8 (ref 5–15)
BUN: 12 mg/dL (ref 8–23)
CO2: 24 mmol/L (ref 22–32)
Calcium: 9 mg/dL (ref 8.9–10.3)
Chloride: 109 mmol/L (ref 98–111)
Creatinine, Ser: 1.08 mg/dL (ref 0.61–1.24)
GFR calc Af Amer: >60 mL/min (ref >60)
GFR calc non Af Amer: >60 mL/min (ref >60)
Glucose, Bld: 165 mg/dL — ABNORMAL HIGH (ref 70–99)
Potassium: 4 mmol/L (ref 3.5–5.1)
Sodium: 141 mmol/L (ref 135–145)
Total Bilirubin: 0.8 mg/dL (ref 0.3–1.2)
Total Protein: 6.9 g/dL (ref 6.5–8.1)

## 2018-01-26 LAB — CBC WITH DIFFERENTIAL/PLATELET
Abs Immature Granulocytes: 0.01 10*3/uL (ref 0.00–0.07)
Basophils Absolute: 0.1 10*3/uL (ref 0.0–0.1)
Basophils Relative: 1 %
Eosinophils Absolute: 0.2 10*3/uL (ref 0.0–0.5)
Eosinophils Relative: 4 %
HCT: 36.3 % — ABNORMAL LOW (ref 39.0–52.0)
Hemoglobin: 11 g/dL — ABNORMAL LOW (ref 13.0–17.0)
Immature Granulocytes: 0 %
Lymphocytes Relative: 40 %
Lymphs Abs: 2.4 10*3/uL (ref 0.7–4.0)
MCH: 32 pg (ref 26.0–34.0)
MCHC: 30.3 g/dL (ref 30.0–36.0)
MCV: 105.5 fL — ABNORMAL HIGH (ref 80.0–100.0)
Monocytes Absolute: 0.9 10*3/uL (ref 0.1–1.0)
Monocytes Relative: 15 %
Neutro Abs: 2.3 10*3/uL (ref 1.7–7.7)
Neutrophils Relative %: 40 %
Platelets: 218 10*3/uL (ref 150–400)
RBC: 3.44 MIL/uL — ABNORMAL LOW (ref 4.22–5.81)
RDW: 14.7 % (ref 11.5–15.5)
WBC: 5.9 10*3/uL (ref 4.0–10.5)
nRBC: 0 % (ref 0.0–0.2)

## 2018-01-26 LAB — LACTATE DEHYDROGENASE: LDH: 182 U/L (ref 98–192)

## 2018-01-26 LAB — FERRITIN: Ferritin: 160 ng/mL (ref 24–336)

## 2018-01-26 MED ORDER — SODIUM CHLORIDE 0.9% FLUSH
10.0000 mL | INTRAVENOUS | Status: DC | PRN
  Administered 2018-01-26: 10 mL
  Filled 2018-01-26: qty 10

## 2018-01-26 MED ORDER — SODIUM CHLORIDE 0.9 % IV SOLN
INTRAVENOUS | Status: DC
  Administered 2018-01-26: 11:00:00 via INTRAVENOUS

## 2018-01-26 MED ORDER — SODIUM CHLORIDE 0.9 % IV SOLN
1920.0000 mg/m2 | INTRAVENOUS | Status: DC
  Administered 2018-01-26: 4200 mg via INTRAVENOUS
  Filled 2018-01-26: qty 84

## 2018-01-26 MED ORDER — FLUOROURACIL CHEMO INJECTION 2.5 GM/50ML
320.0000 mg/m2 | Freq: Once | INTRAVENOUS | Status: AC
  Administered 2018-01-26: 700 mg via INTRAVENOUS
  Filled 2018-01-26: qty 14

## 2018-01-26 MED ORDER — SODIUM CHLORIDE 0.9 % IV SOLN
Freq: Once | INTRAVENOUS | Status: AC
  Administered 2018-01-26: 10:00:00 via INTRAVENOUS

## 2018-01-26 MED ORDER — IRINOTECAN HCL CHEMO INJECTION 100 MG/5ML
144.0000 mg/m2 | Freq: Once | INTRAVENOUS | Status: AC
  Administered 2018-01-26: 320 mg via INTRAVENOUS
  Filled 2018-01-26: qty 16

## 2018-01-26 MED ORDER — SODIUM CHLORIDE 0.9 % IV SOLN
400.0000 mg | Freq: Once | INTRAVENOUS | Status: AC
  Administered 2018-01-26: 400 mg via INTRAVENOUS
  Filled 2018-01-26: qty 16

## 2018-01-26 MED ORDER — SODIUM CHLORIDE 0.9 % IV SOLN
10.0000 mg | Freq: Once | INTRAVENOUS | Status: AC
  Administered 2018-01-26: 10 mg via INTRAVENOUS
  Filled 2018-01-26: qty 1

## 2018-01-26 MED ORDER — LEUCOVORIN CALCIUM INJECTION 350 MG
321.0000 mg/m2 | Freq: Once | INTRAVENOUS | Status: AC
  Administered 2018-01-26: 700 mg via INTRAVENOUS
  Filled 2018-01-26: qty 30

## 2018-01-26 MED ORDER — ATROPINE SULFATE 1 MG/ML IJ SOLN
0.5000 mg | Freq: Once | INTRAMUSCULAR | Status: AC
  Administered 2018-01-26: 0.5 mg via INTRAVENOUS
  Filled 2018-01-26: qty 1

## 2018-01-26 MED ORDER — PALONOSETRON HCL INJECTION 0.25 MG/5ML
0.2500 mg | Freq: Once | INTRAVENOUS | Status: AC
  Administered 2018-01-26: 0.25 mg via INTRAVENOUS
  Filled 2018-01-26: qty 5

## 2018-01-26 NOTE — Patient Instructions (Signed)
[facility] Discharge Instructions for Patients Receiving Chemotherapy   Beginning January 23rd 2017 lab work for the [facility] will be done in the  Main lab at [facility] on 1st floor. If you have a lab appointment with the [facility] please come in thru the  Main Entrance and check in at the main information desk   Today you received the following chemotherapy agents Irinotecan,Leucovorin,5FU and Avastin. Follow-up as scheduled. Call clinic for any questions or concerns  To help prevent nausea and vomiting after your treatment, we encourage you to take your nausea medication   If you develop nausea and vomiting, or diarrhea that is not controlled by your medication, call the clinic.  The clinic phone number is [phone]. Office hours are Monday-Friday 8:30am-5:00pm.  BELOW ARE SYMPTOMS THAT SHOULD BE REPORTED IMMEDIATELY:  *FEVER GREATER THAN 101.0 F  *CHILLS WITH OR WITHOUT FEVER  NAUSEA AND VOMITING THAT IS NOT CONTROLLED WITH YOUR NAUSEA MEDICATION  *UNUSUAL SHORTNESS OF BREATH  *UNUSUAL BRUISING OR BLEEDING  TENDERNESS IN MOUTH AND THROAT WITH OR WITHOUT PRESENCE OF ULCERS  *URINARY PROBLEMS  *BOWEL PROBLEMS  UNUSUAL RASH Items with * indicate a potential emergency and should be followed up as soon as possible. If you have an emergency after office hours please contact your primary care physician or go to the nearest emergency department.  Please call the clinic during office hours if you have any questions or concerns.   You may also contact the Patient Navigator at [phone] should you have any questions or need assistance in obtaining follow up care.      Resources For Cancer Patients and their Caregivers ? American Cancer Society: Can assist with transportation, wigs, general needs, runs Look Good Feel Better.        818-039-5243 ? Cancer Care: Provides financial assistance, online support groups, medication/co-pay  assistance.  1-800-813-HOPE (519)121-0123) ? [facility] Assists [place] cancer patients and their families through emotional , educational and financial support.  781-051-9812 ? [place] DSS Where to apply for food stamps, Medicaid and utility assistance. (269)126-1792 ? RCATS: Transportation to medical appointments. 567-109-8141 ? Social Security Administration: May apply for disability if have a Stage IV cancer. 903-390-1532 639-528-3846 ? [employer], Disability and Transit Services: Assists with nutrition, care and transit needs. ([phone]

## 2018-01-26 NOTE — Progress Notes (Signed)
0915 Lab and urine results reviewed with Dr. Walden Field and pt approved for chemo tx today per MD  Kevin Kirk tolerated chemo tx well without complaints or incident. VSS upon discharge. Pt discharged with 5FU pump infusing without issues. Pt discharged self ambulatory in satisfactory condition accompanied by family member

## 2018-01-28 ENCOUNTER — Encounter (HOSPITAL_COMMUNITY): Payer: Self-pay

## 2018-01-28 ENCOUNTER — Inpatient Hospital Stay (HOSPITAL_COMMUNITY): Payer: Medicare Other

## 2018-01-28 VITALS — BP 119/62 | HR 61 | Temp 97.6°F | Resp 18

## 2018-01-28 DIAGNOSIS — Z5111 Encounter for antineoplastic chemotherapy: Secondary | ICD-10-CM | POA: Diagnosis not present

## 2018-01-28 DIAGNOSIS — C184 Malignant neoplasm of transverse colon: Secondary | ICD-10-CM

## 2018-01-28 DIAGNOSIS — Z95828 Presence of other vascular implants and grafts: Secondary | ICD-10-CM

## 2018-01-28 MED ORDER — HEPARIN SOD (PORK) LOCK FLUSH 100 UNIT/ML IV SOLN
500.0000 [IU] | Freq: Once | INTRAVENOUS | Status: AC | PRN
  Administered 2018-01-28: 500 [IU]

## 2018-01-28 MED ORDER — INFLUENZA VAC SPLIT HIGH-DOSE 0.5 ML IM SUSY
PREFILLED_SYRINGE | INTRAMUSCULAR | Status: AC
  Filled 2018-01-28: qty 0.5

## 2018-01-28 MED ORDER — INFLUENZA VAC SPLIT HIGH-DOSE 0.5 ML IM SUSY
0.5000 mL | PREFILLED_SYRINGE | INTRAMUSCULAR | Status: AC
  Administered 2018-01-28: 0.5 mL via INTRAMUSCULAR

## 2018-01-28 MED ORDER — SODIUM CHLORIDE 0.9% FLUSH
10.0000 mL | INTRAVENOUS | Status: DC | PRN
  Administered 2018-01-28: 10 mL
  Filled 2018-01-28: qty 10

## 2018-01-28 NOTE — Patient Instructions (Signed)
[place] at [facility] Discharge Instructions  5FU pump discontinued with portacath flushed and Influenza vaccine given as well. Follow-up as scheduled. Call clinic for any questions or concerns   Thank you for choosing [place] at [facility] to provide your oncology and hematology care.  To afford each patient quality time with our provider, please arrive at least 15 minutes before your scheduled appointment time.   If you have a lab appointment with the [facility] please come in thru the  Main Entrance and check in at the main information desk  You need to re-schedule your appointment should you arrive 10 or more minutes late.  We strive to give you quality time with our providers, and arriving late affects you and other patients whose appointments are after yours.  Also, if you no show three or more times for appointments you may be dismissed from the clinic at the providers discretion.     Again, thank you for choosing [facility].  Our hope is that these requests will decrease the amount of time that you wait before being seen by our physicians.       _____________________________________________________________  Should you have questions after your visit to [facility], please contact our office at [phone] between the hours of 8:00 a.m. and 4:30 p.m.  Voicemails left after 4:00 p.m. will not be returned until the following business day.  For prescription refill requests, have your pharmacy contact our office and allow 72 hours.    Cancer Center Support Programs:   > Cancer Support Group  2nd Tuesday of the month 1pm-2pm, Journey Room

## 2018-01-28 NOTE — Progress Notes (Signed)
Kevin Kirk tolerated 5FU pump well without complaints or incident. 5FU pump discontinued with port flushed easily per protocol then de-accessed. Pt also tolerated Influenza vaccine well without issues. VSS Pt discharged self ambulatory using cane in satisfactory condition

## 2018-02-04 ENCOUNTER — Ambulatory Visit (HOSPITAL_COMMUNITY)
Admission: RE | Admit: 2018-02-04 | Discharge: 2018-02-04 | Disposition: A | Discharge status: Home / Self Care | Payer: Medicare Other | Source: Ambulatory Visit | Attending: Internal Medicine | Admitting: Internal Medicine

## 2018-02-04 DIAGNOSIS — K8020 Calculus of gallbladder without cholecystitis without obstruction: Secondary | ICD-10-CM | POA: Insufficient documentation

## 2018-02-04 DIAGNOSIS — C189 Malignant neoplasm of colon, unspecified: Secondary | ICD-10-CM

## 2018-02-04 DIAGNOSIS — R918 Other nonspecific abnormal finding of lung field: Secondary | ICD-10-CM | POA: Diagnosis not present

## 2018-02-04 MED ORDER — IOPAMIDOL (ISOVUE-300) INJECTION 61%
100.0000 mL | Freq: Once | INTRAVENOUS | Status: AC | PRN
  Administered 2018-02-04: 100 mL via INTRAVENOUS

## 2018-02-09 ENCOUNTER — Inpatient Hospital Stay (HOSPITAL_COMMUNITY): Payer: Medicare Other

## 2018-02-09 ENCOUNTER — Encounter (HOSPITAL_COMMUNITY): Payer: Self-pay

## 2018-02-09 ENCOUNTER — Other Ambulatory Visit: Payer: Self-pay

## 2018-02-09 ENCOUNTER — Inpatient Hospital Stay (HOSPITAL_BASED_OUTPATIENT_CLINIC_OR_DEPARTMENT_OTHER): Payer: Medicare Other | Admitting: Internal Medicine

## 2018-02-09 VITALS — BP 129/60 | HR 51 | Temp 97.6°F | Resp 18 | Wt 181.8 lb

## 2018-02-09 DIAGNOSIS — I8001 Phlebitis and thrombophlebitis of superficial vessels of right lower extremity: Secondary | ICD-10-CM

## 2018-02-09 DIAGNOSIS — D72829 Elevated white blood cell count, unspecified: Secondary | ICD-10-CM

## 2018-02-09 DIAGNOSIS — T451X5A Adverse effect of antineoplastic and immunosuppressive drugs, initial encounter: Secondary | ICD-10-CM

## 2018-02-09 DIAGNOSIS — C184 Malignant neoplasm of transverse colon: Secondary | ICD-10-CM

## 2018-02-09 DIAGNOSIS — Z5111 Encounter for antineoplastic chemotherapy: Secondary | ICD-10-CM | POA: Diagnosis not present

## 2018-02-09 DIAGNOSIS — D7589 Other specified diseases of blood and blood-forming organs: Secondary | ICD-10-CM

## 2018-02-09 DIAGNOSIS — Z95828 Presence of other vascular implants and grafts: Secondary | ICD-10-CM

## 2018-02-09 DIAGNOSIS — C189 Malignant neoplasm of colon, unspecified: Secondary | ICD-10-CM

## 2018-02-09 DIAGNOSIS — K521 Toxic gastroenteritis and colitis: Secondary | ICD-10-CM

## 2018-02-09 DIAGNOSIS — Z79899 Other long term (current) drug therapy: Secondary | ICD-10-CM

## 2018-02-09 LAB — CBC WITH DIFFERENTIAL/PLATELET
Abs Immature Granulocytes: 0.02 10*3/uL (ref 0.00–0.07)
Basophils Absolute: 0.1 10*3/uL (ref 0.0–0.1)
Basophils Relative: 1 %
Eosinophils Absolute: 0.2 10*3/uL (ref 0.0–0.5)
Eosinophils Relative: 3 %
HCT: 36.1 % — ABNORMAL LOW (ref 39.0–52.0)
Hemoglobin: 11 g/dL — ABNORMAL LOW (ref 13.0–17.0)
Immature Granulocytes: 0 %
Lymphocytes Relative: 34 %
Lymphs Abs: 2.6 10*3/uL (ref 0.7–4.0)
MCH: 31.8 pg (ref 26.0–34.0)
MCHC: 30.5 g/dL (ref 30.0–36.0)
MCV: 104.3 fL — ABNORMAL HIGH (ref 80.0–100.0)
Monocytes Absolute: 1 10*3/uL (ref 0.1–1.0)
Monocytes Relative: 14 %
Neutro Abs: 3.7 10*3/uL (ref 1.7–7.7)
Neutrophils Relative %: 48 %
Platelets: 184 10*3/uL (ref 150–400)
RBC: 3.46 MIL/uL — ABNORMAL LOW (ref 4.22–5.81)
RDW: 14.3 % (ref 11.5–15.5)
WBC: 7.5 10*3/uL (ref 4.0–10.5)
nRBC: 0 % (ref 0.0–0.2)

## 2018-02-09 LAB — URINALYSIS, DIPSTICK ONLY
Bilirubin Urine: NEGATIVE
Glucose, UA: NEGATIVE mg/dL
Hgb urine dipstick: NEGATIVE
Ketones, ur: NEGATIVE mg/dL
Leukocytes, UA: NEGATIVE
Nitrite: NEGATIVE
Protein, ur: NEGATIVE mg/dL
Specific Gravity, Urine: 1.015 (ref 1.005–1.030)
pH: 5 (ref 5.0–8.0)

## 2018-02-09 LAB — COMPREHENSIVE METABOLIC PANEL
ALT: 14 U/L (ref 0–44)
AST: 23 U/L (ref 15–41)
Albumin: 3.5 g/dL (ref 3.5–5.0)
Alkaline Phosphatase: 48 U/L (ref 38–126)
Anion gap: 8 (ref 5–15)
BUN: 10 mg/dL (ref 8–23)
CO2: 26 mmol/L (ref 22–32)
Calcium: 9.1 mg/dL (ref 8.9–10.3)
Chloride: 104 mmol/L (ref 98–111)
Creatinine, Ser: 1.07 mg/dL (ref 0.61–1.24)
GFR calc Af Amer: >60 mL/min (ref >60)
GFR calc non Af Amer: >60 mL/min (ref >60)
Glucose, Bld: 137 mg/dL — ABNORMAL HIGH (ref 70–99)
Potassium: 4.6 mmol/L (ref 3.5–5.1)
Sodium: 138 mmol/L (ref 135–145)
Total Bilirubin: 0.8 mg/dL (ref 0.3–1.2)
Total Protein: 6.9 g/dL (ref 6.5–8.1)

## 2018-02-09 LAB — FERRITIN: Ferritin: 161 ng/mL (ref 24–336)

## 2018-02-09 LAB — LACTATE DEHYDROGENASE: LDH: 182 U/L (ref 98–192)

## 2018-02-09 MED ORDER — SODIUM CHLORIDE 0.9 % IV SOLN
INTRAVENOUS | Status: DC
  Administered 2018-02-09: 12:00:00 via INTRAVENOUS

## 2018-02-09 MED ORDER — ATROPINE SULFATE 1 MG/ML IJ SOLN
0.5000 mg | Freq: Once | INTRAMUSCULAR | Status: AC
  Administered 2018-02-09: 0.5 mg via INTRAVENOUS
  Filled 2018-02-09: qty 1

## 2018-02-09 MED ORDER — SODIUM CHLORIDE 0.9 % IV SOLN
4.2500 mg/kg | Freq: Once | INTRAVENOUS | Status: AC
  Administered 2018-02-09: 400 mg via INTRAVENOUS
  Filled 2018-02-09: qty 16

## 2018-02-09 MED ORDER — ALTEPLASE 2 MG IJ SOLR: 2.0000 mg | Freq: Once | INTRAMUSCULAR | Status: DC | PRN

## 2018-02-09 MED ORDER — PALONOSETRON HCL INJECTION 0.25 MG/5ML
0.2500 mg | Freq: Once | INTRAVENOUS | Status: AC
  Administered 2018-02-09: 0.25 mg via INTRAVENOUS
  Filled 2018-02-09: qty 5

## 2018-02-09 MED ORDER — SODIUM CHLORIDE 0.9 % IV SOLN
10.0000 mg | Freq: Once | INTRAVENOUS | Status: AC
  Administered 2018-02-09: 10 mg via INTRAVENOUS
  Filled 2018-02-09: qty 1

## 2018-02-09 MED ORDER — SODIUM CHLORIDE 0.9 % IV SOLN
Freq: Once | INTRAVENOUS | Status: AC
  Administered 2018-02-09: 11:00:00 via INTRAVENOUS

## 2018-02-09 MED ORDER — DIPHENOXYLATE-ATROPINE 2.5-0.025 MG PO TABS: 1.0000 | ORAL_TABLET | Freq: Four times a day (QID) | ORAL | 3 refills | Status: DC | PRN

## 2018-02-09 MED ORDER — SODIUM CHLORIDE 0.9% FLUSH
10.0000 mL | INTRAVENOUS | Status: DC | PRN
  Administered 2018-02-09: 10 mL
  Filled 2018-02-09: qty 10

## 2018-02-09 MED ORDER — LEUCOVORIN CALCIUM INJECTION 350 MG
321.0000 mg/m2 | Freq: Once | INTRAVENOUS | Status: AC
  Administered 2018-02-09: 700 mg via INTRAVENOUS
  Filled 2018-02-09: qty 35

## 2018-02-09 MED ORDER — LEVOFLOXACIN 500 MG PO TABS: 500.0000 mg | ORAL_TABLET | Freq: Every day | ORAL | 0 refills | Status: AC

## 2018-02-09 MED ORDER — IRINOTECAN HCL CHEMO INJECTION 100 MG/5ML
144.0000 mg/m2 | Freq: Once | INTRAVENOUS | Status: AC
  Administered 2018-02-09: 320 mg via INTRAVENOUS
  Filled 2018-02-09: qty 15

## 2018-02-09 MED ORDER — SODIUM CHLORIDE 0.9 % IV SOLN
1920.0000 mg/m2 | INTRAVENOUS | Status: DC
  Administered 2018-02-09: 4200 mg via INTRAVENOUS
  Filled 2018-02-09: qty 84

## 2018-02-09 MED ORDER — FLUOROURACIL CHEMO INJECTION 2.5 GM/50ML
320.0000 mg/m2 | Freq: Once | INTRAVENOUS | Status: AC
  Administered 2018-02-09: 700 mg via INTRAVENOUS
  Filled 2018-02-09: qty 14

## 2018-02-09 NOTE — Progress Notes (Signed)
1030 Lab and urine results reviewed with and pt seen by Dr. Walden Field and pt approved for chemo tx today per MD                                        Kevin Kirk tolerated chemo tx well without complaints or incident. Pt discharged with 5FU pump infusing without issues. VSS upon discharge. Pt discharged self ambulatory using cane in satisfactory condition accompanied by a friend

## 2018-02-09 NOTE — Progress Notes (Signed)
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[place]) - Plan: Urinalysis, dipstick only, CBC with Differential/Platelet, Comprehensive metabolic panel, Lactate dehydrogenase, Ferritin, CEA, DISCONTINUED: 0.9 %  sodium chloride infusion, DISCONTINUED: sodium chloride flush (NS) 0.9 % injection 10 mL, DISCONTINUED: alteplase (CATHFLO ACTIVASE) injection 2 mg, DISCONTINUED: atropine injection 0.5 mg, DISCONTINUED: palonosetron (ALOXI) injection 0.25 mg, DISCONTINUED: dexamethasone (DECADRON) 10 mg in sodium chloride 0.9 % 50 mL IVPB, DISCONTINUED: bevacizumab (AVASTIN) 450 mg in sodium chloride 0.9 % 100 mL chemo infusion, DISCONTINUED: irinotecan (CAMPTOSAR) 320 mg in dextrose 5 % 500 mL chemo infusion, DISCONTINUED: leucovorin 700 mg in dextrose 5 % 250 mL infusion, DISCONTINUED: fluorouracil (ADRUCIL) chemo injection 700 mg, DISCONTINUED: fluorouracil (ADRUCIL) 4,200 mg in sodium chloride 0.9 % 66 mL chemo infusion  Staging Cancer Staging 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HCC) Staging form: Colon and Rectum, AJCC 8th Edition - Clinical: cT3, cN1a, cM1 - Signed by Twana First, MD on 03/09/2017 - Pathologic: No stage assigned - Unsigned   Assessment and Plan: 1.  Stage IV colon cancer (pT3 pN1 M1) MSI -High: -K-ras mutation negative, BRAF V600E positive, PIK3CA, CDKN2A,TP53  84 y.o. Male treated with immunotherapy with nivolumab for diagnosis of colon cancer under the direction of Dr Lebron Conners.  He had been on Nivo since 02/23/2017.    Due to progression on .PET scan done 07/20/2017,Pt  was presented options of therapy with Folfiri and Avastin.   Pt will be treated with 44f 400 mg/m2 with 5 fu CIVI 2400 mg/m2 over 46 hours, Leucovorin 400 mg/m2, Irinotecan 180 mg/m2 with Avastin 5 mg/kg  Every 2 weeks.  Side effects of the medication were reviewed with pt including primarily bleeding and diarrhea.   CT CAP done 10/12/2017 showed   IMPRESSION: 1. Stable scattered mediastinal lymph nodes. No  new or progressive findings. 2. No findings for pulmonary metastatic disease. 3. No hepatic metastatic disease. 4. The left-sided omental nodule has decreased in size since the prior PET-CT. 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 5. Small scattered retroperitoneal and pelvic lymph nodes. No new or progressive findings. 6. Prostate gland enlargement, stable. 7. Cholelithiasis.  CT CAP done 02/04/2018 reviewed and showed  IMPRESSION: 1. Mild interval increase in size of a few of the mediastinal lymph nodes. Additional mediastinal hilar nodes are stable.  Node went from 0.9 cm to 1 cm.   2. New nodular area of consolidation within the right upper lobe which may be infectious/inflammatory in etiology. There is an adjacent new right upper lobe nodule. Recommend attention on follow-up. 3. Similar-appearing left upper quadrant omental nodule. Similar-appearing nodule within the right hemipelvis. 4. Multiple stones within the gallbladder lumen. Mild gallbladder wall thickening with surrounding fat stranding. Cholecystitis is not excluded. Recommend clinical and laboratory correlation.  Overall, picture is stable.  He will proceed with C15 of Folfiri and Avastin.  Labs done 02/09/2018 reviewed and showed WBC 7.5 HB 11 plts 184,000.  Chemistries WNL with K+ 4.6 Cr 1 and normal LFTs.  UA protein negative.  Pt will be set up for repeat imaging with CT chest in 04/2018.    2.  Chest congestion.  Pt was noted to have some inflammation on recent scan.  He is afebrile in clinic with normal WBC.  He reports minor productive cough.  He will be treated with Levaquin 500 mg po daily for 5 days.  He should notify the clinic if any problems prior to his next  Visit.  3.  Diarrhea.  He reports improvement in symptoms.  Pt advised to use antidiarrheal medications as recommended. Rx for lomotil sent to phamacy.   4.  Macrocytosis.  B12  elevated.  MMA levels normal.  HB stable at  11.  5.  Superficial thrombophlebitis.  RLE doppler done 11/02/2017 reviewed and showed IMPRESSION: 1.  No evidence of right lower extremity deep vein thrombosis. 2. Superficial thrombophlebitis of the right great saphenous vein in the calf.  Aleve prn pain.    6.  IDA.  Patient has been treated with IV iron in the past. HB 11  with ferritin 161.    Will continue to monitor.    7.  Hypertension.  BP is 140/72.  Follow-up with PCP.    8.  Family history of GI cancers.  He reports this occurred in his brother.  Pt has  MSI High, BRAF + tumor.    25 minutes spent with more than 50% spent in counseling and coordination of care.    Interval History:   84 y.o. Treated with nivolumab for diagnosis of recurrent colon cancer under the direction of Dr Lebron Conners.   Pt now on Folfiri and Avastin.    Current Status:  Pt is seen today for follow-up prior to C15 of Folfiri and Avastin.  He is here to go over scan.  He reports minor cough.  He needs refills on lomotil.      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[place])    07/26/2017 -  Chemotherapy    The patient had palonosetron (ALOXI) injection 0.25 mg, 0.25 mg, Intravenous,  Once, 15 of 15 cycles Administration: 0.25 mg (07/27/2017), 0.25 mg (08/10/2017), 0.25 mg (08/24/2017), 0.25 mg (09/07/2017), 0.25 mg (09/21/2017), 0.25 mg (10/05/2017), 0.25 mg (10/19/2017), 0.25 mg (11/02/2017), 0.25 mg (11/16/2017), 0.25 mg (11/30/2017), 0.25 mg (12/14/2017), 0.25 mg (12/28/2017), 0.25 mg (01/12/2018), 0.25 mg (01/26/2018), 0.25 mg (02/09/2018) pegfilgrastim-cbqv (UDENYCA) injection 6 mg, 6 mg, Subcutaneous, Once, 7 of 7 cycles Administration: 6 mg (10/07/2017), 6 mg (10/21/2017), 6 mg (11/04/2017), 6 mg (11/18/2017), 6 mg (12/02/2017), 6 mg (12/16/2017), 6 mg (12/30/2017) bevacizumab (AVASTIN) 450 mg in sodium chloride 0.9 % 100 mL chemo infusion, 5 mg/kg = 450  mg, Intravenous,  Once, 15 of 15 cycles Dose modification: 4 mg/kg (80 % of original dose 5 mg/kg, Cycle 5, Reason: Provider Judgment) Administration: 450 mg (07/27/2017), 450 mg (08/10/2017), 450 mg (08/24/2017), 450 mg (09/07/2017), 450 mg (09/21/2017), 450 mg (10/05/2017), 450 mg (10/19/2017), 450 mg (11/02/2017), 450 mg (11/16/2017), 450 mg (11/30/2017), 450 mg (12/14/2017), 450 mg (12/28/2017), 400 mg (01/12/2018), 400 mg (01/26/2018) irinotecan (CAMPTOSAR) 400 mg in dextrose 5 % 500 mL chemo infusion, 180 mg/m2 = 400 mg, Intravenous,  Once, 15 of 15 cycles Dose modification: 144 mg/m2 (80 % of original dose 180 mg/m2, Cycle 5, Reason: Provider Judgment) Administration: 400 mg (07/27/2017), 400 mg (08/10/2017), 400 mg (08/24/2017), 400 mg (09/07/2017), 320 mg (09/21/2017), 320 mg (10/05/2017), 320 mg (10/19/2017), 320 mg (11/02/2017), 320 mg (11/16/2017), 320 mg (11/30/2017), 320 mg (12/14/2017), 320 mg (12/28/2017), 320 mg (01/12/2018), 320 mg (01/26/2018) leucovorin 800 mg in dextrose 5 % 250 mL infusion, 872 mg, Intravenous,  Once, 15 of 15 cycles Dose modification: 320 mg/m2 (80 % of original dose 400 mg/m2, Cycle 5, Reason: Provider Judgment) Administration: 800 mg (07/27/2017), 800 mg (08/10/2017), 800 mg (08/24/2017), 800 mg (09/07/2017), 700 mg (09/21/2017), 700 mg (10/05/2017), 700 mg (10/19/2017),  700 mg (11/02/2017), 700 mg (11/16/2017), 700 mg (11/30/2017), 700 mg (12/14/2017), 700 mg (12/28/2017), 700 mg (01/12/2018), 700 mg (01/26/2018) fluorouracil (ADRUCIL) chemo injection 850 mg, 400 mg/m2 = 850 mg, Intravenous,  Once, 15 of 15 cycles Dose modification: 320 mg/m2 (80 % of original dose 400 mg/m2, Cycle 5, Reason: Provider Judgment) Administration: 850 mg (07/27/2017), 850 mg (08/10/2017), 850 mg (08/24/2017), 850 mg (09/07/2017), 700 mg (09/21/2017), 700 mg (10/05/2017), 700 mg (10/19/2017), 700 mg (11/02/2017), 700 mg (11/16/2017), 700 mg (11/30/2017), 700 mg (12/14/2017), 700 mg (12/28/2017), 700 mg (01/12/2018), 700 mg (01/26/2018) fluorouracil  (ADRUCIL) 5,250 mg in sodium chloride 0.9 % 145 mL chemo infusion, 2,400 mg/m2 = 5,250 mg, Intravenous, 1 Day/Dose, 15 of 15 cycles Dose modification: 1,920 mg/m2 (80 % of original dose 2,400 mg/m2, Cycle 5, Reason: Provider Judgment) Administration: 5,250 mg (07/27/2017), 5,250 mg (08/10/2017), 5,250 mg (08/24/2017), 5,250 mg (09/07/2017), 4,200 mg (09/21/2017), 4,200 mg (10/05/2017), 4,200 mg (10/19/2017), 4,200 mg (11/02/2017), 4,200 mg (11/16/2017), 4,200 mg (11/30/2017), 4,200 mg (12/14/2017), 4,200 mg (12/28/2017), 4,200 mg (01/12/2018), 4,200 mg (01/26/2018)  for chemotherapy treatment.       Problem List Patient Active Problem List   Diagnosis Date Noted  . Port-A-Cath in place [facility] 05/18/2017  . Iron deficiency anemia [D50.9] 09/07/2016  . S/P partial colectomy [Z90.49] 07/11/2016  . 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 High cholesterol [E78.00] 04/22/2016  . H/O aortic valve replacement [Z95.2] 04/22/2016  . Diabetes ([place]) [E11.9] 04/22/2016  . Chronic pulmonary embolism ([place]) [I27.82] 04/22/2016  . Rectal bleeding [K62.5] 04/22/2016    Past Medical History Past Medical History:  Diagnosis Date  . Chronic pulmonary embolism ([place]) 19/6222   compliction of the valve replacement surgery  . COPD (chronic obstructive pulmonary disease) ([place])   . Diabetes ([place]) 04/22/2016  . H/O aortic valve replacement 10/2008  . Hypercholesteremia   . Hypertension   . Pulmonary emboli ([place]) 10/2008  . Sleep apnea     Past Surgical History Past Surgical History:  Procedure Laterality Date  . AORTIC VALVE REPLACEMENT     2010  . CARDIAC CATHETERIZATION  2010  . COLONOSCOPY N/A 06/05/2016   Procedure: COLONOSCOPY;  Surgeon: Rogene Houston, MD;  Location: [facility] ENDO SUITE;  Service: Endoscopy;  Laterality: N/A;  . PARTIAL COLECTOMY N/A 07/11/2016   Procedure: PARTIAL COLECTOMY;  Surgeon: Aviva Signs, MD;  Location: [facility] ORS;  Service: General;  Laterality: N/A;  . PORTACATH PLACEMENT  N/A 08/13/2016   Procedure: INSERTION PORT-A-CATH LEFT SUBCLAVIAN;  Surgeon: Aviva Signs, MD;  Location: [facility] ORS;  Service: General;  Laterality: N/A;  . REPLACEMENT TOTAL KNEE     1996  . spenectomy     from trauma    Family History Family History  Problem Relation Age of Onset  . Colon cancer Neg Hx      Social History  reports that he quit smoking about 55 years ago. His smoking use included cigarettes. He quit after 15.00 years of use. He has never used smokeless tobacco. He reports that he does not drink alcohol or use drugs.  Medications  Current Outpatient Medications:  .  atorvastatin (LIPITOR) 10 MG tablet, Take 10 mg by mouth at bedtime. , Disp: , Rfl:  .  Cholecalciferol (VITAMIN D3) 5000 units CAPS, Take 5,000 Units by mouth daily. , Disp: , Rfl:  .  diphenoxylate-atropine (LOMOTIL) 2.5-0.025 MG tablet, Take 1 tablet by mouth 4 (four) times daily as needed for diarrhea or loose stools., Disp: 60  tablet, Rfl: 3 .  gabapentin (NEURONTIN) 600 MG tablet, Take 1 tablet (600 mg total) by mouth 3 (three) times daily., Disp: 90 tablet, Rfl: 2 .  levofloxacin (LEVAQUIN) 500 MG tablet, Take 1 tablet (500 mg total) by mouth daily for 5 days., Disp: 5 tablet, Rfl: 0 .  lisinopril (PRINIVIL,ZESTRIL) 5 MG tablet, Take 5 mg by mouth at bedtime. , Disp: , Rfl:  .  metoprolol (LOPRESSOR) 50 MG tablet, Take 50 mg by mouth 2 (two) times daily., Disp: , Rfl:  .  vitamin B-12 (CYANOCOBALAMIN) 1000 MCG tablet, Take 1,000 mcg by mouth daily. , Disp: , Rfl:  No current facility-administered medications for this visit.   Facility-Administered Medications Ordered in Other Visits:  .  0.9 %  sodium chloride infusion, , Intravenous, Continuous, Derek Jack, MD, Last Rate: 20 mL/hr at 02/09/18 1216 .  alteplase (CATHFLO ACTIVASE) injection 2 mg, 2 mg, Intracatheter, Once PRN, Novi Calia, Mathis Dad, MD .  fluorouracil (ADRUCIL) 4,200 mg in sodium chloride 0.9 % 66 mL chemo infusion, 1,920 mg/m2  (Order-Specific), Intravenous, 1 day or 1 dose, Rayan Ines, MD .  fluorouracil (ADRUCIL) chemo injection 700 mg, 320 mg/m2 (Order-Specific), Intravenous, Once, Raeley Gilmore, MD .  irinotecan (CAMPTOSAR) 320 mg in dextrose 5 % 500 mL chemo infusion, 144 mg/m2 (Order-Specific), Intravenous, Once, Whittley Carandang, MD, Last Rate: 344 mL/hr at 02/09/18 1224, 320 mg at 02/09/18 1224 .  leucovorin 700 mg in dextrose 5 % 250 mL infusion, 321 mg/m2 (Order-Specific), Intravenous, Once, Caylei Sperry, MD, Last Rate: 190 mL/hr at 02/09/18 1225, 700 mg at 02/09/18 1225 .  sodium chloride flush (NS) 0.9 % injection 10 mL, 10 mL, Intracatheter, PRN, Gwenevere Goga, MD, 10 mL at 02/09/18 1035  Allergies Amoxicillin and Tamsulosin hcl  Review of Systems Review of Systems - Oncology ROS negative other than minor cough   Physical Exam  Vitals Wt Readings from Last 3 Encounters:  02/09/18 181 lb 12.8 oz (82.5 kg)  01/26/18 183 lb 9.6 oz (83.3 kg)  01/12/18 186 lb 3.2 oz (84.5 kg)   Temp Readings from Last 3 Encounters:  02/09/18 (!) 97.3 F (36.3 C) (Oral)  01/28/18 97.6 F (36.4 C) (Oral)  01/26/18 97.8 F (36.6 C) (Oral)   BP Readings from Last 3 Encounters:  02/09/18 140/72  01/28/18 119/62  01/26/18 126/64   Pulse Readings from Last 3 Encounters:  02/09/18 60  01/28/18 61  01/26/18 62   Constitutional: Well-developed, well-nourished, and in no distress.   HENT: Head: Normocephalic and atraumatic.  Mouth/Throat: No oropharyngeal exudate. Mucosa moist. Eyes: Pupils are equal, round, and reactive to light. Conjunctivae are normal. No scleral icterus.  Neck: Normal range of motion. Neck supple. No JVD present.  Cardiovascular: Normal rate, regular rhythm and normal heart sounds.  Exam reveals no gallop and no friction rub.   No murmur heard. Pulmonary/Chest: Effort normal and breath sounds normal. No respiratory distress. No wheezes.No rales.  Abdominal: Soft. Bowel sounds are normal. No  distension. There is no tenderness. There is no guarding.  Musculoskeletal: No edema or tenderness.  Lymphadenopathy: No cervical, axillary or supraclavicular adenopathy.  Neurological: Alert and oriented to person, place, and time. No cranial nerve deficit.  Skin: Skin is warm and dry. No rash noted. No erythema. No pallor.  Psychiatric: Affect and judgment normal.   Labs Office Visit on 02/09/2018  Component Date Value Ref Range Status  . Color, Urine 02/09/2018 YELLOW  YELLOW Final  . APPearance 02/09/2018 CLEAR  CLEAR Final  .  Specific Gravity, Urine 02/09/2018 1.015  1.005 - 1.030 Final  . pH 02/09/2018 5.0  5.0 - 8.0 Final  . Glucose, UA 02/09/2018 NEGATIVE  NEGATIVE mg/dL Final  . Hgb urine dipstick 02/09/2018 NEGATIVE  NEGATIVE Final  . Bilirubin Urine 02/09/2018 NEGATIVE  NEGATIVE Final  . Ketones, ur 02/09/2018 NEGATIVE  NEGATIVE mg/dL Final  . Protein, ur 02/09/2018 NEGATIVE  NEGATIVE mg/dL Final  . Nitrite 02/09/2018 NEGATIVE  NEGATIVE Final  . Leukocytes, UA 02/09/2018 NEGATIVE  NEGATIVE Final   Performed at [facility], [address], [place], [place] [address]  Appointment on 02/09/2018  Component Date Value Ref Range Status  . WBC 02/09/2018 7.5  4.0 - 10.5 K/uL Final  . RBC 02/09/2018 3.46* 4.22 - 5.81 MIL/uL Final  . Hemoglobin 02/09/2018 11.0* 13.0 - 17.0 g/dL Final  . HCT 02/09/2018 36.1* 39.0 - 52.0 % Final  . MCV 02/09/2018 104.3* 80.0 - 100.0 fL Final  . MCH 02/09/2018 31.8  26.0 - 34.0 pg Final  . MCHC 02/09/2018 30.5  30.0 - 36.0 g/dL Final  . RDW 02/09/2018 14.3  11.5 - 15.5 % Final  . Platelets 02/09/2018 184  150 - 400 K/uL Final  . nRBC 02/09/2018 0.0  0.0 - 0.2 % Final  . Neutrophils Relative % 02/09/2018 48  % Final  . Neutro Abs 02/09/2018 3.7  1.7 - 7.7 K/uL Final  . Lymphocytes Relative 02/09/2018 34  % Final  . Lymphs Abs 02/09/2018 2.6  0.7 - 4.0 K/uL Final  . Monocytes Relative 02/09/2018 14  % Final  . Monocytes Absolute 02/09/2018 1.0   0.1 - 1.0 K/uL Final  . Eosinophils Relative 02/09/2018 3  % Final  . Eosinophils Absolute 02/09/2018 0.2  0.0 - 0.5 K/uL Final  . Basophils Relative 02/09/2018 1  % Final  . Basophils Absolute 02/09/2018 0.1  0.0 - 0.1 K/uL Final  . Immature Granulocytes 02/09/2018 0  % Final  . Abs Immature Granulocytes 02/09/2018 0.02  0.00 - 0.07 K/uL Final   Performed at [facility], [address], [place], [place] [address]  . Sodium 02/09/2018 138  135 - 145 mmol/L Final  . Potassium 02/09/2018 4.6  3.5 - 5.1 mmol/L Final  . Chloride 02/09/2018 104  98 - 111 mmol/L Final  . CO2 02/09/2018 26  22 - 32 mmol/L Final  . Glucose, Bld 02/09/2018 137* 70 - 99 mg/dL Final  . BUN 02/09/2018 10  8 - 23 mg/dL Final  . Creatinine, Ser 02/09/2018 1.07  0.61 - 1.24 mg/dL Final  . Calcium 02/09/2018 9.1  8.9 - 10.3 mg/dL Final  . Total Protein 02/09/2018 6.9  6.5 - 8.1 g/dL Final  . Albumin 02/09/2018 3.5  3.5 - 5.0 g/dL Final  . AST 02/09/2018 23  15 - 41 U/L Final  . ALT 02/09/2018 14  0 - 44 U/L Final  . Alkaline Phosphatase 02/09/2018 48  38 - 126 U/L Final  . Total Bilirubin 02/09/2018 0.8  0.3 - 1.2 mg/dL Final  . GFR calc non Af Amer 02/09/2018 >60  >60 mL/min Final  . GFR calc Af Amer 02/09/2018 >60  >60 mL/min Final   Comment: (NOTE) The eGFR has been calculated using the CKD EPI equation. This calculation has not been validated in all clinical situations. eGFR's persistently <60 mL/min signify possible Chronic Kidney Disease.   Georgiann Hahn gap 02/09/2018 8  5 - 15 Final   Performed at [facility], [address] [place], [place] [address]  .  LDH 02/09/2018 182  98 - 192 U/L Final   Performed at [facility], [address], [place], [place] [address]  . Ferritin 02/09/2018 161  24 - 336 ng/mL Final   Performed at [facility], [address], [place], [place] [address]     Pathology Orders Placed This Encounter  Procedures  . CBC with Differential/Platelet    Standing Status:   Future     Standing Expiration Date:   02/10/2019  . Comprehensive metabolic panel    Standing Status:   Future    Standing Expiration Date:   02/10/2019  . Lactate dehydrogenase    Standing Status:   Future    Standing Expiration Date:   02/10/2019  . Ferritin    Standing Status:   Future    Standing Expiration Date:   02/10/2019  . CEA    Standing Status:   Future    Standing Expiration Date:   02/10/2019       Zoila Shutter MD

## 2018-02-09 NOTE — Patient Instructions (Signed)
[facility] Discharge Instructions for Patients Receiving Chemotherapy   Beginning January 23rd 2017 lab work for the Cancer Center will be done in the  Main lab at [place] on 1st floor. If you have a lab appointment with the Cancer Center please come in thru the  Main Entrance and check in at the main information desk   Today you received the following chemotherapy agents Avastin,Irinotecan,Leucovorin and 5FU. Follow-up as scheduled. Call clinic for any questions or concerns  To help prevent nausea and vomiting after your treatment, we encourage you to take your nausea medication   If you develop nausea and vomiting, or diarrhea that is not controlled by your medication, call the clinic.  The clinic phone number is [phone]. Office hours are Monday-Friday 8:30am-5:00pm.  BELOW ARE SYMPTOMS THAT SHOULD BE REPORTED IMMEDIATELY:  *FEVER GREATER THAN 101.0 F  *CHILLS WITH OR WITHOUT FEVER  NAUSEA AND VOMITING THAT IS NOT CONTROLLED WITH YOUR NAUSEA MEDICATION  *UNUSUAL SHORTNESS OF BREATH  *UNUSUAL BRUISING OR BLEEDING  TENDERNESS IN MOUTH AND THROAT WITH OR WITHOUT PRESENCE OF ULCERS  *URINARY PROBLEMS  *BOWEL PROBLEMS  UNUSUAL RASH Items with * indicate a potential emergency and should be followed up as soon as possible. If you have an emergency after office hours please contact your primary care physician or go to the nearest emergency department.  Please call the clinic during office hours if you have any questions or concerns.   You may also contact the Patient Navigator at [phone] should you have any questions or need assistance in obtaining follow up care.      Resources For Cancer Patients and their Caregivers ? American Cancer Society: Can assist with transportation, wigs, general needs, runs Look Good Feel Better.        1-888-227-6333 ? Cancer Care: Provides financial assistance, online support groups, medication/co-pay  assistance.  1-800-813-HOPE (4673) ? [facility] Assists [place] cancer patients and their families through emotional , educational and financial support.  336-427-4357 ? [place] DSS Where to apply for food stamps, Medicaid and utility assistance. 336-342-1394 ? [facility]: Transportation to medical appointments. 336-347-2287 ? Social Security Administration: May apply for disability if have a Stage IV cancer. 336-342-7796 1-800-772-1213 ? [place] Aging, Disability and Transit Services: Assists with nutrition, care and transit needs. 336-349-2343         

## 2018-02-11 ENCOUNTER — Encounter (HOSPITAL_COMMUNITY): Payer: Self-pay

## 2018-02-11 ENCOUNTER — Inpatient Hospital Stay (HOSPITAL_COMMUNITY): Payer: Medicare Other

## 2018-02-11 VITALS — BP 134/61 | HR 65 | Temp 97.6°F | Resp 18

## 2018-02-11 DIAGNOSIS — C184 Malignant neoplasm of transverse colon: Secondary | ICD-10-CM

## 2018-02-11 DIAGNOSIS — Z95828 Presence of other vascular implants and grafts: Secondary | ICD-10-CM

## 2018-02-11 DIAGNOSIS — Z5111 Encounter for antineoplastic chemotherapy: Secondary | ICD-10-CM | POA: Diagnosis not present

## 2018-02-11 MED ORDER — SODIUM CHLORIDE 0.9% FLUSH
10.0000 mL | INTRAVENOUS | Status: DC | PRN
  Administered 2018-02-11: 10 mL
  Filled 2018-02-11: qty 10

## 2018-02-11 MED ORDER — HEPARIN SOD (PORK) LOCK FLUSH 100 UNIT/ML IV SOLN
500.0000 [IU] | Freq: Once | INTRAVENOUS | Status: AC | PRN
  Administered 2018-02-11: 500 [IU]

## 2018-02-11 NOTE — Progress Notes (Signed)
Kevin Kirk presents to have home infusion pump d/c'd and for port-a-cath deaccess with flush.  Portacath located left chest wall accessed with  H 20 needle.  Good blood return present. Portacath flushed with NS and 500U/71ml Heparin, and needle removed intact.  Procedure tolerated well and without incident.  Discharged ambulatory.

## 2018-02-22 ENCOUNTER — Other Ambulatory Visit (HOSPITAL_COMMUNITY): Payer: Self-pay | Admitting: Internal Medicine

## 2018-02-24 ENCOUNTER — Ambulatory Visit (HOSPITAL_COMMUNITY): Payer: Medicare Other

## 2018-02-24 ENCOUNTER — Other Ambulatory Visit (HOSPITAL_COMMUNITY): Payer: Medicare Other

## 2018-02-24 ENCOUNTER — Inpatient Hospital Stay (HOSPITAL_COMMUNITY): Payer: Medicare Other

## 2018-02-24 ENCOUNTER — Encounter (HOSPITAL_COMMUNITY): Payer: Self-pay

## 2018-02-24 ENCOUNTER — Inpatient Hospital Stay (HOSPITAL_COMMUNITY): Payer: Medicare Other | Attending: Hematology

## 2018-02-24 VITALS — BP 137/67 | HR 56 | Temp 97.6°F | Resp 18 | Wt 188.2 lb

## 2018-02-24 DIAGNOSIS — C184 Malignant neoplasm of transverse colon: Secondary | ICD-10-CM

## 2018-02-24 DIAGNOSIS — Z87891 Personal history of nicotine dependence: Secondary | ICD-10-CM | POA: Diagnosis not present

## 2018-02-24 DIAGNOSIS — Z79899 Other long term (current) drug therapy: Secondary | ICD-10-CM | POA: Insufficient documentation

## 2018-02-24 DIAGNOSIS — D7589 Other specified diseases of blood and blood-forming organs: Secondary | ICD-10-CM | POA: Insufficient documentation

## 2018-02-24 DIAGNOSIS — R197 Diarrhea, unspecified: Secondary | ICD-10-CM | POA: Insufficient documentation

## 2018-02-24 DIAGNOSIS — I8001 Phlebitis and thrombophlebitis of superficial vessels of right lower extremity: Secondary | ICD-10-CM | POA: Insufficient documentation

## 2018-02-24 DIAGNOSIS — Z5111 Encounter for antineoplastic chemotherapy: Secondary | ICD-10-CM | POA: Diagnosis not present

## 2018-02-24 DIAGNOSIS — R911 Solitary pulmonary nodule: Secondary | ICD-10-CM | POA: Insufficient documentation

## 2018-02-24 DIAGNOSIS — Z95828 Presence of other vascular implants and grafts: Secondary | ICD-10-CM

## 2018-02-24 LAB — URINALYSIS, DIPSTICK ONLY
Bilirubin Urine: NEGATIVE
Glucose, UA: 50 mg/dL — AB
Hgb urine dipstick: NEGATIVE
Ketones, ur: NEGATIVE mg/dL
Leukocytes, UA: NEGATIVE
Nitrite: NEGATIVE
Protein, ur: NEGATIVE mg/dL
Specific Gravity, Urine: 1.021 (ref 1.005–1.030)
pH: 5 (ref 5.0–8.0)

## 2018-02-24 LAB — COMPREHENSIVE METABOLIC PANEL
ALT: 13 U/L (ref 0–44)
AST: 20 U/L (ref 15–41)
Albumin: 3.4 g/dL — ABNORMAL LOW (ref 3.5–5.0)
Alkaline Phosphatase: 44 U/L (ref 38–126)
Anion gap: 6 (ref 5–15)
BUN: 11 mg/dL (ref 8–23)
CO2: 27 mmol/L (ref 22–32)
Calcium: 8.7 mg/dL — ABNORMAL LOW (ref 8.9–10.3)
Chloride: 107 mmol/L (ref 98–111)
Creatinine, Ser: 1.01 mg/dL (ref 0.61–1.24)
GFR calc Af Amer: >60 mL/min (ref >60)
GFR calc non Af Amer: >60 mL/min (ref >60)
Glucose, Bld: 133 mg/dL — ABNORMAL HIGH (ref 70–99)
Potassium: 4.5 mmol/L (ref 3.5–5.1)
Sodium: 140 mmol/L (ref 135–145)
Total Bilirubin: 0.7 mg/dL (ref 0.3–1.2)
Total Protein: 6.6 g/dL (ref 6.5–8.1)

## 2018-02-24 LAB — CBC WITH DIFFERENTIAL/PLATELET
Abs Immature Granulocytes: 0.01 10*3/uL (ref 0.00–0.07)
Basophils Absolute: 0.1 10*3/uL (ref 0.0–0.1)
Basophils Relative: 1 %
Eosinophils Absolute: 0.2 10*3/uL (ref 0.0–0.5)
Eosinophils Relative: 3 %
HCT: 34.2 % — ABNORMAL LOW (ref 39.0–52.0)
Hemoglobin: 10.3 g/dL — ABNORMAL LOW (ref 13.0–17.0)
Immature Granulocytes: 0 %
Lymphocytes Relative: 38 %
Lymphs Abs: 2.4 10*3/uL (ref 0.7–4.0)
MCH: 32.1 pg (ref 26.0–34.0)
MCHC: 30.1 g/dL (ref 30.0–36.0)
MCV: 106.5 fL — ABNORMAL HIGH (ref 80.0–100.0)
Monocytes Absolute: 1.2 10*3/uL — ABNORMAL HIGH (ref 0.1–1.0)
Monocytes Relative: 19 %
Neutro Abs: 2.4 10*3/uL (ref 1.7–7.7)
Neutrophils Relative %: 39 %
Platelets: 213 10*3/uL (ref 150–400)
RBC: 3.21 MIL/uL — ABNORMAL LOW (ref 4.22–5.81)
RDW: 15.4 % (ref 11.5–15.5)
WBC: 6.3 10*3/uL (ref 4.0–10.5)
nRBC: 0 % (ref 0.0–0.2)

## 2018-02-24 MED ORDER — SODIUM CHLORIDE 0.9 % IV SOLN
INTRAVENOUS | Status: DC
  Administered 2018-02-24: 12:00:00 via INTRAVENOUS

## 2018-02-24 MED ORDER — SODIUM CHLORIDE 0.9 % IV SOLN
10.0000 mg | Freq: Once | INTRAVENOUS | Status: AC
  Administered 2018-02-24: 10 mg via INTRAVENOUS
  Filled 2018-02-24: qty 1

## 2018-02-24 MED ORDER — SODIUM CHLORIDE 0.9% FLUSH
10.0000 mL | INTRAVENOUS | Status: DC | PRN
  Administered 2018-02-24: 10 mL
  Filled 2018-02-24: qty 10

## 2018-02-24 MED ORDER — SODIUM CHLORIDE 0.9 % IV SOLN
1920.0000 mg/m2 | INTRAVENOUS | Status: DC
  Administered 2018-02-24: 3950 mg via INTRAVENOUS
  Filled 2018-02-24: qty 79

## 2018-02-24 MED ORDER — IRINOTECAN HCL CHEMO INJECTION 100 MG/5ML
144.0000 mg/m2 | Freq: Once | INTRAVENOUS | Status: AC
  Administered 2018-02-24: 300 mg via INTRAVENOUS
  Filled 2018-02-24: qty 15

## 2018-02-24 MED ORDER — LEUCOVORIN CALCIUM INJECTION 350 MG
700.0000 mg | Freq: Once | INTRAVENOUS | Status: AC
  Administered 2018-02-24: 700 mg via INTRAVENOUS
  Filled 2018-02-24: qty 35

## 2018-02-24 MED ORDER — SODIUM CHLORIDE 0.9 % IV SOLN
Freq: Once | INTRAVENOUS | Status: AC
  Administered 2018-02-24: 10:00:00 via INTRAVENOUS

## 2018-02-24 MED ORDER — PALONOSETRON HCL INJECTION 0.25 MG/5ML
0.2500 mg | Freq: Once | INTRAVENOUS | Status: AC
  Administered 2018-02-24: 0.25 mg via INTRAVENOUS
  Filled 2018-02-24: qty 5

## 2018-02-24 MED ORDER — SODIUM CHLORIDE 0.9 % IV SOLN
400.0000 mg | Freq: Once | INTRAVENOUS | Status: AC
  Administered 2018-02-24: 400 mg via INTRAVENOUS
  Filled 2018-02-24: qty 16

## 2018-02-24 MED ORDER — FLUOROURACIL CHEMO INJECTION 2.5 GM/50ML
320.0000 mg/m2 | Freq: Once | INTRAVENOUS | Status: AC
  Administered 2018-02-24: 650 mg via INTRAVENOUS
  Filled 2018-02-24: qty 13

## 2018-02-24 MED ORDER — ATROPINE SULFATE 1 MG/ML IJ SOLN
0.5000 mg | Freq: Once | INTRAMUSCULAR | Status: AC
  Administered 2018-02-24: 0.5 mg via INTRAVENOUS
  Filled 2018-02-24: qty 1

## 2018-02-24 NOTE — Progress Notes (Signed)
Patient tolerated treatment with no complaints voiced.  Port site clean and dry with dressing intact.  Chemotherapy pump connected with no alarms noted.  VSS with discharge and left ambulatory with no s/s of distress noted.

## 2018-02-24 NOTE — Patient Instructions (Signed)
[facility] Discharge Instructions for Patients Receiving Chemotherapy     If you develop nausea and vomiting that is not controlled by your nausea medication, call the clinic.   BELOW ARE SYMPTOMS THAT SHOULD BE REPORTED IMMEDIATELY:  *FEVER GREATER THAN 100.5 F  *CHILLS WITH OR WITHOUT FEVER  NAUSEA AND VOMITING THAT IS NOT CONTROLLED WITH YOUR NAUSEA MEDICATION  *UNUSUAL SHORTNESS OF BREATH  *UNUSUAL BRUISING OR BLEEDING  TENDERNESS IN MOUTH AND THROAT WITH OR WITHOUT PRESENCE OF ULCERS  *URINARY PROBLEMS  *BOWEL PROBLEMS  UNUSUAL RASH Items with * indicate a potential emergency and should be followed up as soon as possible.  Feel free to call the clinic should you have any questions or concerns. The clinic phone number is [phone].  Please show the CHEMO ALERT CARD at check-in to the Emergency Department and triage nurse.   

## 2018-02-26 ENCOUNTER — Encounter (HOSPITAL_COMMUNITY): Payer: Self-pay

## 2018-02-26 ENCOUNTER — Inpatient Hospital Stay (HOSPITAL_COMMUNITY): Payer: Medicare Other

## 2018-02-26 VITALS — BP 137/63 | HR 68 | Temp 97.5°F | Resp 20

## 2018-02-26 DIAGNOSIS — Z5111 Encounter for antineoplastic chemotherapy: Secondary | ICD-10-CM | POA: Diagnosis not present

## 2018-02-26 DIAGNOSIS — C184 Malignant neoplasm of transverse colon: Secondary | ICD-10-CM

## 2018-02-26 DIAGNOSIS — Z95828 Presence of other vascular implants and grafts: Secondary | ICD-10-CM

## 2018-02-26 MED ORDER — HEPARIN SOD (PORK) LOCK FLUSH 100 UNIT/ML IV SOLN
500.0000 [IU] | Freq: Once | INTRAVENOUS | Status: AC | PRN
  Administered 2018-02-26: 500 [IU]

## 2018-02-26 MED ORDER — SODIUM CHLORIDE 0.9% FLUSH
10.0000 mL | INTRAVENOUS | Status: DC | PRN
  Administered 2018-02-26: 10 mL
  Filled 2018-02-26: qty 10

## 2018-02-26 NOTE — Patient Instructions (Signed)
[facility] at [facility] Discharge Instructions  5FU pump discontinued with portacath flushed per protocol. Follow-up as scheduled. Call clinic for any questions or concerns   Thank you for choosing [facility] at [facility] to provide your oncology and hematology care.  To afford each patient quality time with our provider, please arrive at least 15 minutes before your scheduled appointment time.   If you have a lab appointment with the Cancer Center please come in thru the  Main Entrance and check in at the main information desk  You need to re-schedule your appointment should you arrive 10 or more minutes late.  We strive to give you quality time with our providers, and arriving late affects you and other patients whose appointments are after yours.  Also, if you no show three or more times for appointments you may be dismissed from the clinic at the providers discretion.     Again, thank you for choosing [facility].  Our hope is that these requests will decrease the amount of time that you wait before being seen by our physicians.       _____________________________________________________________  Should you have questions after your visit to [facility], please contact our office at [phone] between the hours of 8:00 a.m. and 4:30 p.m.  Voicemails left after 4:00 p.m. will not be returned until the following business day.  For prescription refill requests, have your pharmacy contact our office and allow 72 hours.    Cancer Center Support Programs:   > Cancer Support Group  2nd Tuesday of the month 1pm-2pm, Journey Room   

## 2018-02-26 NOTE — Progress Notes (Signed)
Kevin Kirk tolerated discontinuation of 5FU pump with portacath flush well without complaints or incident. Port flushed per protocol then de-accessed. VSS Pt discharged self ambulatory using cane in satisfactory condition

## 2018-03-10 ENCOUNTER — Inpatient Hospital Stay (HOSPITAL_BASED_OUTPATIENT_CLINIC_OR_DEPARTMENT_OTHER): Payer: Medicare Other | Admitting: Internal Medicine

## 2018-03-10 ENCOUNTER — Inpatient Hospital Stay (HOSPITAL_COMMUNITY): Payer: Medicare Other

## 2018-03-10 ENCOUNTER — Other Ambulatory Visit: Payer: Self-pay

## 2018-03-10 ENCOUNTER — Encounter (HOSPITAL_COMMUNITY): Payer: Self-pay | Admitting: Internal Medicine

## 2018-03-10 VITALS — BP 109/63 | HR 65 | Temp 97.6°F | Resp 16 | Wt 182.0 lb

## 2018-03-10 VITALS — BP 124/66 | HR 70 | Temp 97.4°F | Resp 18

## 2018-03-10 DIAGNOSIS — C189 Malignant neoplasm of colon, unspecified: Secondary | ICD-10-CM

## 2018-03-10 DIAGNOSIS — C184 Malignant neoplasm of transverse colon: Secondary | ICD-10-CM

## 2018-03-10 DIAGNOSIS — Z95828 Presence of other vascular implants and grafts: Secondary | ICD-10-CM

## 2018-03-10 DIAGNOSIS — D7589 Other specified diseases of blood and blood-forming organs: Secondary | ICD-10-CM | POA: Diagnosis not present

## 2018-03-10 DIAGNOSIS — I8001 Phlebitis and thrombophlebitis of superficial vessels of right lower extremity: Secondary | ICD-10-CM

## 2018-03-10 DIAGNOSIS — R197 Diarrhea, unspecified: Secondary | ICD-10-CM | POA: Diagnosis not present

## 2018-03-10 DIAGNOSIS — Z87891 Personal history of nicotine dependence: Secondary | ICD-10-CM

## 2018-03-10 DIAGNOSIS — R911 Solitary pulmonary nodule: Secondary | ICD-10-CM | POA: Diagnosis not present

## 2018-03-10 DIAGNOSIS — Z5111 Encounter for antineoplastic chemotherapy: Secondary | ICD-10-CM | POA: Diagnosis not present

## 2018-03-10 DIAGNOSIS — Z79899 Other long term (current) drug therapy: Secondary | ICD-10-CM

## 2018-03-10 LAB — URINALYSIS, DIPSTICK ONLY
Bilirubin Urine: NEGATIVE
Glucose, UA: NEGATIVE mg/dL
Hgb urine dipstick: NEGATIVE
Ketones, ur: NEGATIVE mg/dL
Leukocytes, UA: NEGATIVE
Nitrite: NEGATIVE
Protein, ur: NEGATIVE mg/dL
Specific Gravity, Urine: 1.016 (ref 1.005–1.030)
pH: 5 (ref 5.0–8.0)

## 2018-03-10 LAB — CBC WITH DIFFERENTIAL/PLATELET
Abs Immature Granulocytes: 0.01 10*3/uL (ref 0.00–0.07)
Basophils Absolute: 0 10*3/uL (ref 0.0–0.1)
Basophils Relative: 1 %
Eosinophils Absolute: 0.1 10*3/uL (ref 0.0–0.5)
Eosinophils Relative: 2 %
HCT: 36.5 % — ABNORMAL LOW (ref 39.0–52.0)
Hemoglobin: 11 g/dL — ABNORMAL LOW (ref 13.0–17.0)
Immature Granulocytes: 0 %
Lymphocytes Relative: 37 %
Lymphs Abs: 2.4 10*3/uL (ref 0.7–4.0)
MCH: 32.2 pg (ref 26.0–34.0)
MCHC: 30.1 g/dL (ref 30.0–36.0)
MCV: 106.7 fL — ABNORMAL HIGH (ref 80.0–100.0)
Monocytes Absolute: 1 10*3/uL (ref 0.1–1.0)
Monocytes Relative: 16 %
Neutro Abs: 2.9 10*3/uL (ref 1.7–7.7)
Neutrophils Relative %: 44 %
Platelets: 206 10*3/uL (ref 150–400)
RBC: 3.42 MIL/uL — ABNORMAL LOW (ref 4.22–5.81)
RDW: 15.3 % (ref 11.5–15.5)
WBC: 6.6 10*3/uL (ref 4.0–10.5)
nRBC: 0 % (ref 0.0–0.2)

## 2018-03-10 LAB — COMPREHENSIVE METABOLIC PANEL
ALT: 15 U/L (ref 0–44)
AST: 22 U/L (ref 15–41)
Albumin: 3.6 g/dL (ref 3.5–5.0)
Alkaline Phosphatase: 41 U/L (ref 38–126)
Anion gap: 8 (ref 5–15)
BUN: 16 mg/dL (ref 8–23)
CO2: 26 mmol/L (ref 22–32)
Calcium: 8.9 mg/dL (ref 8.9–10.3)
Chloride: 102 mmol/L (ref 98–111)
Creatinine, Ser: 1.03 mg/dL (ref 0.61–1.24)
GFR calc Af Amer: >60 mL/min (ref >60)
GFR calc non Af Amer: >60 mL/min (ref >60)
Glucose, Bld: 153 mg/dL — ABNORMAL HIGH (ref 70–99)
Potassium: 4.4 mmol/L (ref 3.5–5.1)
Sodium: 136 mmol/L (ref 135–145)
Total Bilirubin: 1.1 mg/dL (ref 0.3–1.2)
Total Protein: 6.8 g/dL (ref 6.5–8.1)

## 2018-03-10 LAB — FERRITIN: Ferritin: 185 ng/mL (ref 24–336)

## 2018-03-10 LAB — LACTATE DEHYDROGENASE: LDH: 191 U/L (ref 98–192)

## 2018-03-10 MED ORDER — SODIUM CHLORIDE 0.9% FLUSH
10.0000 mL | INTRAVENOUS | Status: DC | PRN
  Administered 2018-03-10: 10 mL
  Filled 2018-03-10: qty 10

## 2018-03-10 MED ORDER — ATROPINE SULFATE 1 MG/ML IJ SOLN
0.5000 mg | Freq: Once | INTRAMUSCULAR | Status: AC
  Administered 2018-03-10: 0.5 mg via INTRAVENOUS
  Filled 2018-03-10: qty 1

## 2018-03-10 MED ORDER — FLUOROURACIL CHEMO INJECTION 2.5 GM/50ML
320.0000 mg/m2 | Freq: Once | INTRAVENOUS | Status: AC
  Administered 2018-03-10: 650 mg via INTRAVENOUS
  Filled 2018-03-10: qty 13

## 2018-03-10 MED ORDER — SODIUM CHLORIDE 0.9 % IV SOLN
INTRAVENOUS | Status: DC
  Administered 2018-03-10: 12:00:00 via INTRAVENOUS

## 2018-03-10 MED ORDER — SODIUM CHLORIDE 0.9 % IV SOLN
1920.0000 mg/m2 | INTRAVENOUS | Status: DC
  Administered 2018-03-10: 3950 mg via INTRAVENOUS
  Filled 2018-03-10: qty 79

## 2018-03-10 MED ORDER — SODIUM CHLORIDE 0.9 % IV SOLN
10.0000 mg | Freq: Once | INTRAVENOUS | Status: AC
  Administered 2018-03-10: 10 mg via INTRAVENOUS
  Filled 2018-03-10: qty 1

## 2018-03-10 MED ORDER — LEUCOVORIN CALCIUM INJECTION 350 MG
700.0000 mg | Freq: Once | INTRAVENOUS | Status: AC
  Administered 2018-03-10: 700 mg via INTRAVENOUS
  Filled 2018-03-10: qty 35

## 2018-03-10 MED ORDER — SODIUM CHLORIDE 0.9 % IV SOLN
400.0000 mg | Freq: Once | INTRAVENOUS | Status: AC
  Administered 2018-03-10: 400 mg via INTRAVENOUS
  Filled 2018-03-10: qty 16

## 2018-03-10 MED ORDER — PALONOSETRON HCL INJECTION 0.25 MG/5ML
0.2500 mg | Freq: Once | INTRAVENOUS | Status: AC
  Administered 2018-03-10: 0.25 mg via INTRAVENOUS
  Filled 2018-03-10: qty 5

## 2018-03-10 MED ORDER — SODIUM CHLORIDE 0.9 % IV SOLN
Freq: Once | INTRAVENOUS | Status: AC
  Administered 2018-03-10: 10:00:00 via INTRAVENOUS

## 2018-03-10 MED ORDER — IRINOTECAN HCL CHEMO INJECTION 100 MG/5ML
144.0000 mg/m2 | Freq: Once | INTRAVENOUS | Status: AC
  Administered 2018-03-10: 300 mg via INTRAVENOUS
  Filled 2018-03-10: qty 15

## 2018-03-10 NOTE — Progress Notes (Signed)
Diagnosis No diagnosis found.  Staging Cancer Staging Malignant neoplasm of transverse colon [facility]) Staging form: Colon and Rectum, AJCC 8th Edition - Clinical: cT3, cN1a, cM1 - Signed by Twana First, MD on 03/09/2017 - Pathologic: No stage assigned - Unsigned   Assessment and Plan:  1.  Stage IV colon cancer (pT3 pN1 M1) MSI -High: -K-ras mutation negative, BRAF V600E positive, PIK3CA, CDKN2A,TP53  84 y.o. Male treated with immunotherapy with nivolumab for diagnosis of colon cancer under the direction of Dr Lebron Conners.  He had been on Nivo since 02/23/2017.    Due to progression on .PET scan done 07/20/2017,Pt  was presented options of therapy with Folfiri and Avastin.   Pt will be treated with 59f 400 mg/m2 with 5 fu CIVI 2400 mg/m2 over 46 hours, Leucovorin 400 mg/m2, Irinotecan 180 mg/m2 with Avastin 5 mg/kg  Every 2 weeks.  Side effects of the medication were reviewed with pt including primarily bleeding and diarrhea.   CT CAP done 10/12/2017 showed   IMPRESSION: 1. Stable scattered mediastinal lymph nodes. No new or progressive findings. 2. No findings for pulmonary metastatic disease. 3. No hepatic metastatic disease. 4. The left-sided omental nodule has decreased in size since the prior PET-CT. 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 5. Small scattered retroperitoneal and pelvic lymph nodes. No new or progressive findings. 6. Prostate gland enlargement, stable. 7. Cholelithiasis.  CT CAP done 02/04/2018 reviewed and showed  IMPRESSION: 1. Mild interval increase in size of a few of the mediastinal lymph nodes. Additional mediastinal hilar nodes are stable.  Node went from 0.9 cm to 1 cm.   2. New nodular area of consolidation within the right upper lobe which may be infectious/inflammatory in etiology. There is an adjacent new right upper lobe nodule. Recommend attention on follow-up. 3. Similar-appearing  left upper quadrant omental nodule. Similar-appearing nodule within the right hemipelvis. 4. Multiple stones within the gallbladder lumen. Mild gallbladder wall thickening with surrounding fat stranding. Cholecystitis is not excluded. Recommend clinical and laboratory correlation.  Overall, picture is stable.   Labs done 03/10/2018 reviewed and showed WBC 6.6 HB 11 plts 206,000.  Chemistries WNL with K+ 4.4 Cr 1.03 and normal LFTs. UA negative for protein.   Pt will proceed with C17 of Folfiri and Avastin.  He will continue therapy as directed.  He will be seen for follow-up in 1 month.  Pt will be set up for repeat imaging with CT chest in 04/2018.    2.  Chest congestion.  Pt was noted to have some inflammation on recent scan.  He was treated with Levaquin and reports symptoms have improved.  He should notify the office if any change in respiratory status.    3.  Diarrhea.  He reports improvement in symptoms.  Pt advised to use antidiarrheal medications as recommended.   4.  Macrocytosis.  B12 elevated.  MMA levels normal.  HB stable at  11.  5.  Superficial thrombophlebitis.  RLE doppler done 11/02/2017 reviewed and showed IMPRESSION: 1.  No evidence of right lower extremity deep vein thrombosis. 2. Superficial thrombophlebitis of the right great saphenous vein in the calf.  Aleve prn pain.    6.  IDA.  Patient has been treated with IV iron in the past. HB 11  With recent  ferritin 161.    Will continue to monitor.    7.  Hypertension.  BP is 109/63.  Follow-up with PCP.  8.  Family history of GI cancers.  He reports this occurred in his brother.  Pt has  MSI High, BRAF + tumor.    25 minutes spent with more than 50% spent in counseling and coordination of care.    Interval History:   84 y.o. Treated with nivolumab for diagnosis of recurrent colon cancer under the direction of Dr Lebron Conners.   Pt now on Folfiri and Avastin.    Current Status:  Pt is seen today for follow-up prior to  C17 of Folfiri and Avastin.  He reports diarrhea is improved.     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[place])   07/11/2016 Initial Diagnosis    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[place])    07/27/2017 -  Chemotherapy    The patient had palonosetron (ALOXI) injection 0.25 mg, 0.25 mg, Intravenous,  Once, 17 of 17 cycles Administration: 0.25 mg (07/27/2017), 0.25 mg (08/10/2017), 0.25 mg (08/24/2017), 0.25 mg (09/07/2017), 0.25 mg (09/21/2017), 0.25 mg (10/05/2017), 0.25 mg (10/19/2017), 0.25 mg (11/02/2017), 0.25 mg (11/16/2017), 0.25 mg (11/30/2017), 0.25 mg (12/14/2017), 0.25 mg (12/28/2017), 0.25 mg (01/12/2018), 0.25 mg (01/26/2018), 0.25 mg (02/09/2018), 0.25 mg (02/24/2018) pegfilgrastim-cbqv (UDENYCA) injection 6 mg, 6 mg, Subcutaneous, Once, 7 of 7 cycles Administration: 6 mg (10/07/2017), 6 mg (10/21/2017), 6 mg (11/04/2017), 6 mg (11/18/2017), 6 mg (12/02/2017), 6 mg (12/16/2017), 6 mg (12/30/2017) bevacizumab (AVASTIN) 450 mg in sodium chloride 0.9 % 100 mL chemo infusion, 5 mg/kg = 450 mg, Intravenous,  Once, 17 of 17 cycles Dose modification: 4 mg/kg (80 % of original dose 5 mg/kg, Cycle 5, Reason: Provider Judgment), 5 mg/kg (original dose 5 mg/kg, Cycle 16, Reason: Other (see comments)) Administration: 450 mg (07/27/2017), 450 mg (08/10/2017), 450 mg (08/24/2017), 450 mg (09/07/2017), 450 mg (09/21/2017), 450 mg (10/05/2017), 450 mg (10/19/2017), 450 mg (11/02/2017), 450 mg (11/16/2017), 450 mg (11/30/2017), 450 mg (12/14/2017), 450 mg (12/28/2017), 400 mg (01/12/2018), 400 mg (01/26/2018), 400 mg (02/09/2018), 400 mg (02/24/2018) irinotecan (CAMPTOSAR) 400 mg in dextrose 5 % 500 mL chemo infusion, 180 mg/m2 = 400 mg, Intravenous,  Once, 17 of 17 cycles Dose modification: 144 mg/m2 (80 % of original dose 180 mg/m2, Cycle 5, Reason: Provider Judgment) Administration: 400 mg (07/27/2017), 400 mg (08/10/2017), 400 mg (08/24/2017), 400 mg (09/07/2017), 320 mg (09/21/2017), 320 mg (10/05/2017), 320 mg (10/19/2017), 320 mg (11/02/2017), 320 mg  (11/16/2017), 320 mg (11/30/2017), 320 mg (12/14/2017), 320 mg (12/28/2017), 320 mg (01/12/2018), 320 mg (01/26/2018), 320 mg (02/09/2018), 300 mg (02/24/2018) leucovorin 800 mg in dextrose 5 % 250 mL infusion, 872 mg, Intravenous,  Once, 17 of 17 cycles Dose modification: 320 mg/m2 (80 % of original dose 400 mg/m2, Cycle 5, Reason: Provider Judgment) Administration: 800 mg (07/27/2017), 800 mg (08/10/2017), 800 mg (08/24/2017), 800 mg (09/07/2017), 700 mg (09/21/2017), 700 mg (10/05/2017), 700 mg (10/19/2017), 700 mg (11/02/2017), 700 mg (11/16/2017), 700 mg (11/30/2017), 700 mg (12/14/2017), 700 mg (12/28/2017), 700 mg (01/12/2018), 700 mg (01/26/2018), 700 mg (02/09/2018), 700 mg (02/24/2018) fluorouracil (ADRUCIL) chemo injection 850 mg, 400 mg/m2 = 850 mg, Intravenous,  Once, 17 of 17 cycles Dose modification: 320 mg/m2 (80 % of original dose 400 mg/m2, Cycle 5, Reason: Provider Judgment) Administration: 850 mg (07/27/2017), 850 mg (08/10/2017), 850 mg (08/24/2017), 850 mg (09/07/2017), 700 mg (09/21/2017), 700 mg (10/05/2017), 700 mg (10/19/2017), 700 mg (11/02/2017), 700 mg (11/16/2017), 700 mg (11/30/2017), 700 mg (12/14/2017), 700 mg (12/28/2017), 700 mg (01/12/2018), 700 mg (01/26/2018), 700 mg (02/09/2018), 650 mg (02/24/2018) fluorouracil (ADRUCIL) 5,250 mg in sodium chloride  0.9 % 145 mL chemo infusion, 2,400 mg/m2 = 5,250 mg, Intravenous, 1 Day/Dose, 17 of 17 cycles Dose modification: 1,920 mg/m2 (80 % of original dose 2,400 mg/m2, Cycle 5, Reason: Provider Judgment) Administration: 5,250 mg (07/27/2017), 5,250 mg (08/10/2017), 5,250 mg (08/24/2017), 5,250 mg (09/07/2017), 4,200 mg (09/21/2017), 4,200 mg (10/05/2017), 4,200 mg (10/19/2017), 4,200 mg (11/02/2017), 4,200 mg (11/16/2017), 4,200 mg (11/30/2017), 4,200 mg (12/14/2017), 4,200 mg (12/28/2017), 4,200 mg (01/12/2018), 4,200 mg (01/26/2018), 4,200 mg (02/09/2018), 3,950 mg (02/24/2018)  for chemotherapy treatment.       Problem List Patient Active Problem List   Diagnosis Date Noted  .  Port-A-Cath in place [facility] 05/18/2017  . Iron deficiency anemia [D50.9] 09/07/2016  . S/P partial colectomy [Z90.49] 07/11/2016  . 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[place]) [C18.4]   . High cholesterol [E78.00] 04/22/2016  . H/O aortic valve replacement [Z95.2] 04/22/2016  . Diabetes ([place]) [E11.9] 04/22/2016  . Chronic pulmonary embolism ([place]) [I27.82] 04/22/2016  . Rectal bleeding [K62.5] 04/22/2016    Past Medical History Past Medical History:  Diagnosis Date  . Chronic pulmonary embolism ([place]) 17/7116   compliction of the valve replacement surgery  . COPD (chronic obstructive pulmonary disease) ([place])   . Diabetes ([place]) 04/22/2016  . H/O aortic valve replacement 10/2008  . Hypercholesteremia   . Hypertension   . Pulmonary emboli ([place]) 10/2008  . Sleep apnea     Past Surgical History Past Surgical History:  Procedure Laterality Date  . AORTIC VALVE REPLACEMENT     2010  . CARDIAC CATHETERIZATION  2010  . COLONOSCOPY N/A 06/05/2016   Procedure: COLONOSCOPY;  Surgeon: Rogene Houston, MD;  Location: [facility] ENDO SUITE;  Service: Endoscopy;  Laterality: N/A;  . PARTIAL COLECTOMY N/A 07/11/2016   Procedure: PARTIAL COLECTOMY;  Surgeon: Aviva Signs, MD;  Location: [facility] ORS;  Service: General;  Laterality: N/A;  . PORTACATH PLACEMENT N/A 08/13/2016   Procedure: INSERTION PORT-A-CATH LEFT SUBCLAVIAN;  Surgeon: Aviva Signs, MD;  Location: [facility] ORS;  Service: General;  Laterality: N/A;  . REPLACEMENT TOTAL KNEE     1996  . spenectomy     from trauma    Family History Family History  Problem Relation Age of Onset  . Colon cancer Neg Hx      Social History  reports that he quit smoking about 56 years ago. His smoking use included cigarettes. He quit after 15.00 years of use. He has never used smokeless tobacco. He reports that he does not drink alcohol or use drugs.  Medications  Current Outpatient Medications:  .  loperamide (IMODIUM A-D) 2 MG tablet, Take 2 mg by mouth 2  (two) times daily., Disp: , Rfl:  .  atorvastatin (LIPITOR) 10 MG tablet, Take 10 mg by mouth at bedtime. , Disp: , Rfl:  .  Cholecalciferol (VITAMIN D3) 5000 units CAPS, Take 5,000 Units by mouth daily. , Disp: , Rfl:  .  diphenoxylate-atropine (LOMOTIL) 2.5-0.025 MG tablet, Take 1 tablet by mouth 4 (four) times daily as needed for diarrhea or loose stools., Disp: 60 tablet, Rfl: 3 .  gabapentin (NEURONTIN) 600 MG tablet, Take 1 tablet (600 mg total) by mouth 3 (three) times daily., Disp: 90 tablet, Rfl: 2 .  lisinopril (PRINIVIL,ZESTRIL) 5 MG tablet, Take 5 mg by mouth at bedtime. , Disp: , Rfl:  .  metoprolol (LOPRESSOR) 50 MG tablet, Take 50 mg by mouth 2 (two) times daily., Disp: , Rfl:  .  vitamin B-12 (CYANOCOBALAMIN) 1000 MCG tablet, Take 1,000 mcg by  mouth daily. , Disp: , Rfl:  No current facility-administered medications for this visit.   Facility-Administered Medications Ordered in Other Visits:  .  0.9 %  sodium chloride infusion, , Intravenous, Continuous, Derek Jack, MD, Last Rate: 20 mL/hr at 03/10/18 1137 .  fluorouracil (ADRUCIL) 3,950 mg in sodium chloride 0.9 % 71 mL chemo infusion, 1,920 mg/m2 (Treatment Plan Recorded), Intravenous, 1 day or 1 dose, Even Budlong, MD .  fluorouracil (ADRUCIL) chemo injection 650 mg, 320 mg/m2 (Treatment Plan Recorded), Intravenous, Once, Gina Costilla, MD .  irinotecan (CAMPTOSAR) 300 mg in dextrose 5 % 500 mL chemo infusion, 144 mg/m2 (Treatment Plan Recorded), Intravenous, Once, Gurbani Figge, MD, Last Rate: 343 mL/hr at 03/10/18 1140, 300 mg at 03/10/18 1140 .  leucovorin 700 mg in dextrose 5 % 250 mL infusion, 700 mg, Intravenous, Once, Samariya Rockhold, MD, Last Rate: 190 mL/hr at 03/10/18 1141, 700 mg at 03/10/18 1141 .  sodium chloride flush (NS) 0.9 % injection 10 mL, 10 mL, Intracatheter, PRN, Joselyne Spake, MD, 10 mL at 03/10/18 0935  Allergies Amoxicillin and Tamsulosin hcl  Review of Systems Review of Systems - Oncology ROS  negative except diarrhea improved.     Physical Exam  Vitals Wt Readings from Last 3 Encounters:  03/10/18 182 lb (82.6 kg)  02/24/18 188 lb 3.2 oz (85.4 kg)  02/09/18 181 lb 12.8 oz (82.5 kg)   Temp Readings from Last 3 Encounters:  03/10/18 97.6 F (36.4 C) (Oral)  02/26/18 (!) 97.5 F (36.4 C) (Oral)  02/24/18 97.6 F (36.4 C) (Oral)   BP Readings from Last 3 Encounters:  03/10/18 109/63  02/26/18 137/63  02/24/18 137/67   Pulse Readings from Last 3 Encounters:  03/10/18 65  02/26/18 68  02/24/18 (!) 56   Constitutional: Well-developed, well-nourished, and in no distress.   HENT: Head: Normocephalic and atraumatic.  Mouth/Throat: No oropharyngeal exudate. Mucosa moist. Eyes: Pupils are equal, round, and reactive to light. Conjunctivae are normal. No scleral icterus.  Neck: Normal range of motion. Neck supple. No JVD present.  Cardiovascular: Normal rate, regular rhythm and normal heart sounds.  Exam reveals no gallop and no friction rub.   No murmur heard. Pulmonary/Chest: Effort normal and breath sounds normal. No respiratory distress. No wheezes.No rales.  Abdominal: Soft. Bowel sounds are normal. No distension. There is no tenderness. There is no guarding.  Musculoskeletal: No edema or tenderness.  Lymphadenopathy: No cervical, axillary or supraclavicular adenopathy.  Neurological: Alert and oriented to person, place, and time. No cranial nerve deficit.  Skin: Skin is warm and dry. No rash noted. No erythema. No pallor.  Psychiatric: Affect and judgment normal.   Labs Appointment on 03/10/2018  Component Date Value Ref Range Status  . Color, Urine 03/10/2018 YELLOW  YELLOW Final  . APPearance 03/10/2018 CLEAR  CLEAR Final  . Specific Gravity, Urine 03/10/2018 1.016  1.005 - 1.030 Final  . pH 03/10/2018 5.0  5.0 - 8.0 Final  . Glucose, UA 03/10/2018 NEGATIVE  NEGATIVE mg/dL Final  . Hgb urine dipstick 03/10/2018 NEGATIVE  NEGATIVE Final  . Bilirubin Urine  03/10/2018 NEGATIVE  NEGATIVE Final  . Ketones, ur 03/10/2018 NEGATIVE  NEGATIVE mg/dL Final  . Protein, ur 03/10/2018 NEGATIVE  NEGATIVE mg/dL Final  . Nitrite 03/10/2018 NEGATIVE  NEGATIVE Final  . Leukocytes, UA 03/10/2018 NEGATIVE  NEGATIVE Final   Performed at [facility], [address] [place], [place] [address]  . WBC 03/10/2018 6.6  4.0 - 10.5 K/uL Final  . RBC  03/10/2018 3.42* 4.22 - 5.81 MIL/uL Final  . Hemoglobin 03/10/2018 11.0* 13.0 - 17.0 g/dL Final  . HCT 03/10/2018 36.5* 39.0 - 52.0 % Final  . MCV 03/10/2018 106.7* 80.0 - 100.0 fL Final  . MCH 03/10/2018 32.2  26.0 - 34.0 pg Final  . MCHC 03/10/2018 30.1  30.0 - 36.0 g/dL Final  . RDW 03/10/2018 15.3  11.5 - 15.5 % Final  . Platelets 03/10/2018 206  150 - 400 K/uL Final  . nRBC 03/10/2018 0.0  0.0 - 0.2 % Final  . Neutrophils Relative % 03/10/2018 44  % Final  . Neutro Abs 03/10/2018 2.9  1.7 - 7.7 K/uL Final  . Lymphocytes Relative 03/10/2018 37  % Final  . Lymphs Abs 03/10/2018 2.4  0.7 - 4.0 K/uL Final  . Monocytes Relative 03/10/2018 16  % Final  . Monocytes Absolute 03/10/2018 1.0  0.1 - 1.0 K/uL Final  . Eosinophils Relative 03/10/2018 2  % Final  . Eosinophils Absolute 03/10/2018 0.1  0.0 - 0.5 K/uL Final  . Basophils Relative 03/10/2018 1  % Final  . Basophils Absolute 03/10/2018 0.0  0.0 - 0.1 K/uL Final  . Immature Granulocytes 03/10/2018 0  % Final  . Abs Immature Granulocytes 03/10/2018 0.01  0.00 - 0.07 K/uL Final   Performed at [facility], [address], [place], [place] [address]  . Sodium 03/10/2018 136  135 - 145 mmol/L Final  . Potassium 03/10/2018 4.4  3.5 - 5.1 mmol/L Final  . Chloride 03/10/2018 102  98 - 111 mmol/L Final  . CO2 03/10/2018 26  22 - 32 mmol/L Final  . Glucose, Bld 03/10/2018 153* 70 - 99 mg/dL Final  . BUN 03/10/2018 16  8 - 23 mg/dL Final  . Creatinine, Ser 03/10/2018 1.03  0.61 - 1.24 mg/dL Final  . Calcium 03/10/2018 8.9  8.9 - 10.3 mg/dL Final  . Total Protein  03/10/2018 6.8  6.5 - 8.1 g/dL Final  . Albumin 03/10/2018 3.6  3.5 - 5.0 g/dL Final  . AST 03/10/2018 22  15 - 41 U/L Final  . ALT 03/10/2018 15  0 - 44 U/L Final  . Alkaline Phosphatase 03/10/2018 41  38 - 126 U/L Final  . Total Bilirubin 03/10/2018 1.1  0.3 - 1.2 mg/dL Final  . GFR calc non Af Amer 03/10/2018 >60  >60 mL/min Final  . GFR calc Af Amer 03/10/2018 >60  >60 mL/min Final  . Anion gap 03/10/2018 8  5 - 15 Final   Performed at [facility], [address] [place], [place] [address]  . LDH 03/10/2018 191  98 - 192 U/L Final   Performed at [facility], [address] [place], [place] [address]     Pathology No orders of the defined types were placed in this encounter.      Zoila Shutter MD

## 2018-03-10 NOTE — Patient Instructions (Signed)
[facility] Discharge Instructions for Patients Receiving Chemotherapy   Beginning January 23rd 2017 lab work for the Cancer Center will be done in the  Main lab at [place] on 1st floor. If you have a lab appointment with the Cancer Center please come in thru the  Main Entrance and check in at the main information desk   Today you received the following chemotherapy agents Avastin,Irinotecan,Leucovorin and 5FU. Follow-up as scheduled. Call clinic for any questions or concerns  To help prevent nausea and vomiting after your treatment, we encourage you to take your nausea medication   If you develop nausea and vomiting, or diarrhea that is not controlled by your medication, call the clinic.  The clinic phone number is [phone]. Office hours are Monday-Friday 8:30am-5:00pm.  BELOW ARE SYMPTOMS THAT SHOULD BE REPORTED IMMEDIATELY:  *FEVER GREATER THAN 101.0 F  *CHILLS WITH OR WITHOUT FEVER  NAUSEA AND VOMITING THAT IS NOT CONTROLLED WITH YOUR NAUSEA MEDICATION  *UNUSUAL SHORTNESS OF BREATH  *UNUSUAL BRUISING OR BLEEDING  TENDERNESS IN MOUTH AND THROAT WITH OR WITHOUT PRESENCE OF ULCERS  *URINARY PROBLEMS  *BOWEL PROBLEMS  UNUSUAL RASH Items with * indicate a potential emergency and should be followed up as soon as possible. If you have an emergency after office hours please contact your primary care physician or go to the nearest emergency department.  Please call the clinic during office hours if you have any questions or concerns.   You may also contact the Patient Navigator at [phone] should you have any questions or need assistance in obtaining follow up care.      Resources For Cancer Patients and their Caregivers ? American Cancer Society: Can assist with transportation, wigs, general needs, runs Look Good Feel Better.        1-888-227-6333 ? Cancer Care: Provides financial assistance, online support groups, medication/co-pay  assistance.  1-800-813-HOPE (4673) ? [facility] Assists [place] cancer patients and their families through emotional , educational and financial support.  336-427-4357 ? [place] DSS Where to apply for food stamps, Medicaid and utility assistance. 336-342-1394 ? [facility]: Transportation to medical appointments. 336-347-2287 ? Social Security Administration: May apply for disability if have a Stage IV cancer. 336-342-7796 1-800-772-1213 ? [place] Aging, Disability and Transit Services: Assists with nutrition, care and transit needs. 336-349-2343         

## 2018-03-10 NOTE — Patient Instructions (Signed)
[facility] at [facility]  Discharge Instructions: You saw Dr. Higgs today                               _______________________________________________________________  Thank you for choosing [facility] at [facility] to provide your oncology and hematology care.  To afford each patient quality time with our providers, please arrive at least 15 minutes before your scheduled appointment.  You need to re-schedule your appointment if you arrive 10 or more minutes late.  We strive to give you quality time with our providers, and arriving late affects you and other patients whose appointments are after yours.  Also, if you no show three or more times for appointments you may be dismissed from the clinic.  Again, thank you for choosing [facility] at [facility]. Our hope is that these requests will allow you access to exceptional care and in a timely manner. _______________________________________________________________  If you have questions after your visit, please contact our office at [phone] between the hours of 8:30 a.m. and 5:00 p.m. Voicemails left after 4:30 p.m. will not be returned until the following business day. _______________________________________________________________  For prescription refill requests, have your pharmacy contact our office. _______________________________________________________________  Recommendations made by the consultant and any test results will be sent to your referring physician. _______________________________________________________________ 

## 2018-03-10 NOTE — Progress Notes (Signed)
0930 Lab and urine results reviewed with and pt seen by Dr. Walden Field and pt approved for chemo tx today per MD                                                              Kevin Kirk tolerated chemo tx well without complaints or incident. Pt discharged with 5FU pump infusing without issues. VSS upon discharge. Pt discharged self ambulatory using cane in satisfactory condition accompanied by family member

## 2018-03-11 LAB — CEA: CEA: 5.9 ng/mL — ABNORMAL HIGH (ref 0.0–4.7)

## 2018-03-12 ENCOUNTER — Other Ambulatory Visit: Payer: Self-pay

## 2018-03-12 ENCOUNTER — Inpatient Hospital Stay (HOSPITAL_COMMUNITY): Payer: Medicare Other

## 2018-03-12 ENCOUNTER — Encounter (HOSPITAL_COMMUNITY): Payer: Self-pay

## 2018-03-12 VITALS — BP 133/72 | HR 67 | Temp 98.2°F | Resp 18

## 2018-03-12 DIAGNOSIS — Z95828 Presence of other vascular implants and grafts: Secondary | ICD-10-CM

## 2018-03-12 DIAGNOSIS — C184 Malignant neoplasm of transverse colon: Secondary | ICD-10-CM

## 2018-03-12 DIAGNOSIS — Z5111 Encounter for antineoplastic chemotherapy: Secondary | ICD-10-CM | POA: Diagnosis not present

## 2018-03-12 MED ORDER — HEPARIN SOD (PORK) LOCK FLUSH 100 UNIT/ML IV SOLN
500.0000 [IU] | Freq: Once | INTRAVENOUS | Status: AC | PRN
  Administered 2018-03-12: 500 [IU]

## 2018-03-12 MED ORDER — SODIUM CHLORIDE 0.9% FLUSH
10.0000 mL | INTRAVENOUS | Status: DC | PRN
  Administered 2018-03-12: 10 mL
  Filled 2018-03-12: qty 10

## 2018-03-12 NOTE — Progress Notes (Signed)
Kevin Kirk presents to have home infusion pump d/c'd and for port-a-cath deaccess with flush.  Proper placement of portacath confirmed by CXR.  Portacath located left chest wall accessed with  H 20 needle.  Good blood return present. Portacath flushed with NS and 500U/52ml Heparin, and needle removed intact.  Procedure tolerated well and without incident.  Discharged ambulatory in c/o family.

## 2018-03-26 ENCOUNTER — Other Ambulatory Visit (HOSPITAL_COMMUNITY): Payer: Self-pay | Admitting: Hematology

## 2018-03-26 ENCOUNTER — Inpatient Hospital Stay (HOSPITAL_COMMUNITY): Payer: Medicare Other | Attending: Hematology

## 2018-03-26 DIAGNOSIS — I8001 Phlebitis and thrombophlebitis of superficial vessels of right lower extremity: Secondary | ICD-10-CM | POA: Insufficient documentation

## 2018-03-26 DIAGNOSIS — E7800 Pure hypercholesterolemia, unspecified: Secondary | ICD-10-CM | POA: Diagnosis not present

## 2018-03-26 DIAGNOSIS — I10 Essential (primary) hypertension: Secondary | ICD-10-CM | POA: Diagnosis not present

## 2018-03-26 DIAGNOSIS — Z952 Presence of prosthetic heart valve: Secondary | ICD-10-CM | POA: Insufficient documentation

## 2018-03-26 DIAGNOSIS — I2782 Chronic pulmonary embolism: Secondary | ICD-10-CM | POA: Diagnosis not present

## 2018-03-26 DIAGNOSIS — J449 Chronic obstructive pulmonary disease, unspecified: Secondary | ICD-10-CM | POA: Diagnosis not present

## 2018-03-26 DIAGNOSIS — E119 Type 2 diabetes mellitus without complications: Secondary | ICD-10-CM | POA: Insufficient documentation

## 2018-03-26 DIAGNOSIS — Z5111 Encounter for antineoplastic chemotherapy: Secondary | ICD-10-CM | POA: Diagnosis present

## 2018-03-26 DIAGNOSIS — D7589 Other specified diseases of blood and blood-forming organs: Secondary | ICD-10-CM | POA: Insufficient documentation

## 2018-03-26 DIAGNOSIS — C184 Malignant neoplasm of transverse colon: Secondary | ICD-10-CM | POA: Diagnosis present

## 2018-03-26 DIAGNOSIS — Z87891 Personal history of nicotine dependence: Secondary | ICD-10-CM | POA: Diagnosis not present

## 2018-03-26 LAB — URINALYSIS, DIPSTICK ONLY
Bilirubin Urine: NEGATIVE
Glucose, UA: 50 mg/dL — AB
Hgb urine dipstick: NEGATIVE
Ketones, ur: NEGATIVE mg/dL
Leukocytes, UA: NEGATIVE
Nitrite: NEGATIVE
Protein, ur: NEGATIVE mg/dL
Specific Gravity, Urine: 1.005 (ref 1.005–1.030)
pH: 6 (ref 5.0–8.0)

## 2018-03-26 LAB — CBC WITH DIFFERENTIAL/PLATELET
Abs Immature Granulocytes: 0.02 10*3/uL (ref 0.00–0.07)
Basophils Absolute: 0 10*3/uL (ref 0.0–0.1)
Basophils Relative: 1 %
Eosinophils Absolute: 0.3 10*3/uL (ref 0.0–0.5)
Eosinophils Relative: 3 %
HCT: 37.1 % — ABNORMAL LOW (ref 39.0–52.0)
Hemoglobin: 11.2 g/dL — ABNORMAL LOW (ref 13.0–17.0)
Immature Granulocytes: 0 %
Lymphocytes Relative: 38 %
Lymphs Abs: 2.8 10*3/uL (ref 0.7–4.0)
MCH: 32.3 pg (ref 26.0–34.0)
MCHC: 30.2 g/dL (ref 30.0–36.0)
MCV: 106.9 fL — ABNORMAL HIGH (ref 80.0–100.0)
Monocytes Absolute: 1.1 10*3/uL — ABNORMAL HIGH (ref 0.1–1.0)
Monocytes Relative: 15 %
Neutro Abs: 3.2 10*3/uL (ref 1.7–7.7)
Neutrophils Relative %: 43 %
Platelets: 205 10*3/uL (ref 150–400)
RBC: 3.47 MIL/uL — ABNORMAL LOW (ref 4.22–5.81)
RDW: 15.9 % — ABNORMAL HIGH (ref 11.5–15.5)
WBC: 7.4 10*3/uL (ref 4.0–10.5)
nRBC: 0 % (ref 0.0–0.2)

## 2018-03-26 LAB — COMPREHENSIVE METABOLIC PANEL
ALT: 16 U/L (ref 0–44)
AST: 22 U/L (ref 15–41)
Albumin: 3.6 g/dL (ref 3.5–5.0)
Alkaline Phosphatase: 44 U/L (ref 38–126)
Anion gap: 9 (ref 5–15)
BUN: 15 mg/dL (ref 8–23)
CO2: 26 mmol/L (ref 22–32)
Calcium: 9.3 mg/dL (ref 8.9–10.3)
Chloride: 103 mmol/L (ref 98–111)
Creatinine, Ser: 1.06 mg/dL (ref 0.61–1.24)
GFR calc Af Amer: >60 mL/min (ref >60)
GFR calc non Af Amer: >60 mL/min (ref >60)
Glucose, Bld: 171 mg/dL — ABNORMAL HIGH (ref 70–99)
Potassium: 4.7 mmol/L (ref 3.5–5.1)
Sodium: 138 mmol/L (ref 135–145)
Total Bilirubin: 0.7 mg/dL (ref 0.3–1.2)
Total Protein: 6.9 g/dL (ref 6.5–8.1)

## 2018-03-29 ENCOUNTER — Inpatient Hospital Stay (HOSPITAL_COMMUNITY): Payer: Medicare Other

## 2018-03-29 VITALS — BP 140/57 | HR 51 | Temp 97.5°F | Resp 18 | Wt 185.2 lb

## 2018-03-29 DIAGNOSIS — Z5111 Encounter for antineoplastic chemotherapy: Secondary | ICD-10-CM | POA: Diagnosis not present

## 2018-03-29 DIAGNOSIS — Z95828 Presence of other vascular implants and grafts: Secondary | ICD-10-CM

## 2018-03-29 DIAGNOSIS — C184 Malignant neoplasm of transverse colon: Secondary | ICD-10-CM

## 2018-03-29 MED ORDER — IRINOTECAN HCL CHEMO INJECTION 100 MG/5ML
144.0000 mg/m2 | Freq: Once | INTRAVENOUS | Status: AC
  Administered 2018-03-29: 300 mg via INTRAVENOUS
  Filled 2018-03-29: qty 15

## 2018-03-29 MED ORDER — ATROPINE SULFATE 1 MG/ML IJ SOLN
INTRAMUSCULAR | Status: AC
  Filled 2018-03-29: qty 1

## 2018-03-29 MED ORDER — SODIUM CHLORIDE 0.9 % IV SOLN
1920.0000 mg/m2 | INTRAVENOUS | Status: DC
  Administered 2018-03-29: 3950 mg via INTRAVENOUS
  Filled 2018-03-29: qty 79

## 2018-03-29 MED ORDER — SODIUM CHLORIDE 0.9 % IV SOLN
400.0000 mg | Freq: Once | INTRAVENOUS | Status: AC
  Administered 2018-03-29: 400 mg via INTRAVENOUS
  Filled 2018-03-29: qty 16

## 2018-03-29 MED ORDER — PALONOSETRON HCL INJECTION 0.25 MG/5ML
INTRAVENOUS | Status: AC
  Filled 2018-03-29: qty 5

## 2018-03-29 MED ORDER — SODIUM CHLORIDE 0.9 % IV SOLN
INTRAVENOUS | Status: DC
  Administered 2018-03-29: 09:00:00 via INTRAVENOUS

## 2018-03-29 MED ORDER — ATROPINE SULFATE 1 MG/ML IJ SOLN
0.5000 mg | Freq: Once | INTRAMUSCULAR | Status: AC
  Administered 2018-03-29: 0.5 mg via INTRAVENOUS

## 2018-03-29 MED ORDER — PALONOSETRON HCL INJECTION 0.25 MG/5ML
0.2500 mg | Freq: Once | INTRAVENOUS | Status: AC
  Administered 2018-03-29: 0.25 mg via INTRAVENOUS

## 2018-03-29 MED ORDER — OCTREOTIDE ACETATE 30 MG IM KIT
PACK | INTRAMUSCULAR | Status: AC
  Filled 2018-03-29: qty 1

## 2018-03-29 MED ORDER — FLUOROURACIL CHEMO INJECTION 2.5 GM/50ML
320.0000 mg/m2 | Freq: Once | INTRAVENOUS | Status: AC
  Administered 2018-03-29: 650 mg via INTRAVENOUS
  Filled 2018-03-29: qty 13

## 2018-03-29 MED ORDER — SODIUM CHLORIDE 0.9% FLUSH
10.0000 mL | INTRAVENOUS | Status: DC | PRN
  Administered 2018-03-29: 10 mL
  Filled 2018-03-29: qty 10

## 2018-03-29 MED ORDER — SODIUM CHLORIDE 0.9 % IV SOLN
Freq: Once | INTRAVENOUS | Status: AC
  Administered 2018-03-29: 09:00:00 via INTRAVENOUS

## 2018-03-29 MED ORDER — LEUCOVORIN CALCIUM INJECTION 350 MG
700.0000 mg | Freq: Once | INTRAVENOUS | Status: AC
  Administered 2018-03-29: 700 mg via INTRAVENOUS
  Filled 2018-03-29: qty 35

## 2018-03-29 MED ORDER — SODIUM CHLORIDE 0.9 % IV SOLN
10.0000 mg | Freq: Once | INTRAVENOUS | Status: AC
  Administered 2018-03-29: 10 mg via INTRAVENOUS
  Filled 2018-03-29: qty 1

## 2018-03-29 NOTE — Progress Notes (Signed)
0815 labs and UA from 1/3 reviewed and pt approved for Avastin and Folfiri treatment today.  Bazil H Toran tolerated Avastin and Folfiri infusions without incident or complaint. VSS upon completion of treatment. 5FU infusing via home infusion pump without difficulties. Discharged self ambulatory in satisfactory condition in presence of friend.

## 2018-03-29 NOTE — Patient Instructions (Signed)
[facility] Discharge Instructions for Patients Receiving Chemotherapy   Beginning January 23rd 2017 lab work for the [facility] will be done in the  Main lab at [facility] on 1st floor. If you have a lab appointment with the [facility] please come in thru the  Main Entrance and check in at the main information desk   Today you received the following chemotherapy agents Avastin, Leucovorin, Irinotecan, and 5FU.  To help prevent nausea and vomiting after your treatment, we encourage you to take your nausea medication   If you develop nausea and vomiting, or diarrhea that is not controlled by your medication, call the clinic.  The clinic phone number is [phone]. Office hours are Monday-Friday 8:30am-5:00pm.  BELOW ARE SYMPTOMS THAT SHOULD BE REPORTED IMMEDIATELY:  *FEVER GREATER THAN 101.0 F  *CHILLS WITH OR WITHOUT FEVER  NAUSEA AND VOMITING THAT IS NOT CONTROLLED WITH YOUR NAUSEA MEDICATION  *UNUSUAL SHORTNESS OF BREATH  *UNUSUAL BRUISING OR BLEEDING  TENDERNESS IN MOUTH AND THROAT WITH OR WITHOUT PRESENCE OF ULCERS  *URINARY PROBLEMS  *BOWEL PROBLEMS  UNUSUAL RASH Items with * indicate a potential emergency and should be followed up as soon as possible. If you have an emergency after office hours please contact your primary care physician or go to the nearest emergency department.  Please call the clinic during office hours if you have any questions or concerns.   You may also contact the Patient Navigator at [phone] should you have any questions or need assistance in obtaining follow up care.      Resources For Cancer Patients and their Caregivers ? American Cancer Society: Can assist with transportation, wigs, general needs, runs Look Good Feel Better.        580-228-7990 ? Cancer Care: Provides financial assistance, online support groups, medication/co-pay assistance.  1-800-813-HOPE (314) 043-5979) ? [place] Assists [place] cancer patients and their families through emotional , educational and financial support.  6264264220 ? [place] DSS Where to apply for food stamps, Medicaid and utility assistance. 773-151-1702 ? [facility]: Transportation to medical appointments. (920)211-6563 ? Social Security Administration: May apply for disability if have a Stage IV cancer. ([phone] [phone] (913)556-4351 ? [employer], Disability and Transit Services: Assists with nutrition, care and transit needs. [phone]

## 2018-03-31 ENCOUNTER — Inpatient Hospital Stay (HOSPITAL_COMMUNITY): Payer: Medicare Other

## 2018-03-31 ENCOUNTER — Encounter (HOSPITAL_COMMUNITY): Payer: Self-pay

## 2018-03-31 VITALS — BP 136/64 | HR 57 | Temp 98.0°F | Resp 18

## 2018-03-31 DIAGNOSIS — Z5111 Encounter for antineoplastic chemotherapy: Secondary | ICD-10-CM | POA: Diagnosis not present

## 2018-03-31 DIAGNOSIS — Z95828 Presence of other vascular implants and grafts: Secondary | ICD-10-CM

## 2018-03-31 DIAGNOSIS — C184 Malignant neoplasm of transverse colon: Secondary | ICD-10-CM

## 2018-03-31 MED ORDER — SODIUM CHLORIDE 0.9% FLUSH
10.0000 mL | INTRAVENOUS | Status: DC | PRN
  Administered 2018-03-31: 10 mL
  Filled 2018-03-31: qty 10

## 2018-03-31 MED ORDER — HEPARIN SOD (PORK) LOCK FLUSH 100 UNIT/ML IV SOLN
500.0000 [IU] | Freq: Once | INTRAVENOUS | Status: AC | PRN
  Administered 2018-03-31: 500 [IU]

## 2018-03-31 NOTE — Patient Instructions (Signed)
[facility]  Discharge Instructions:   _______________________________________________________________  Thank you for choosing [facility] at [facility] to provide your oncology and hematology care.  To afford each patient quality time with our providers, please arrive at least 15 minutes before your scheduled appointment.  You need to re-schedule your appointment if you arrive 10 or more minutes late.  We strive to give you quality time with our providers, and arriving late affects you and other patients whose appointments are after yours.  Also, if you no show three or more times for appointments you may be dismissed from the clinic.  Again, thank you for choosing [facility] at [facility]. Our hope is that these requests will allow you access to exceptional care and in a timely manner. _______________________________________________________________  If you have questions after your visit, please contact our office at [phone] between the hours of 8:30 a.m. and 5:00 p.m. Voicemails left after 4:30 p.m. will not be returned until the following business day. _______________________________________________________________  For prescription refill requests, have your pharmacy contact our office. _______________________________________________________________  Recommendations made by the consultant and any test results will be sent to your referring physician. _______________________________________________________________ 

## 2018-03-31 NOTE — Progress Notes (Signed)
Patients pump disconnected with no complaints voiced.  Site clean and dry with good blood return noted.  Band aid applied.  VSS with discharge and left ambulatory with no s/s of distress noted.

## 2018-04-05 ENCOUNTER — Other Ambulatory Visit (HOSPITAL_COMMUNITY): Payer: Self-pay | Admitting: Internal Medicine

## 2018-04-05 DIAGNOSIS — G629 Polyneuropathy, unspecified: Secondary | ICD-10-CM

## 2018-04-08 ENCOUNTER — Encounter (HOSPITAL_COMMUNITY): Payer: Self-pay | Admitting: Adult Health

## 2018-04-09 ENCOUNTER — Other Ambulatory Visit (HOSPITAL_COMMUNITY): Payer: Self-pay | Admitting: *Deleted

## 2018-04-09 DIAGNOSIS — C184 Malignant neoplasm of transverse colon: Secondary | ICD-10-CM

## 2018-04-12 ENCOUNTER — Other Ambulatory Visit: Payer: Self-pay

## 2018-04-12 ENCOUNTER — Inpatient Hospital Stay (HOSPITAL_BASED_OUTPATIENT_CLINIC_OR_DEPARTMENT_OTHER): Payer: Medicare Other | Admitting: Internal Medicine

## 2018-04-12 ENCOUNTER — Inpatient Hospital Stay (HOSPITAL_COMMUNITY): Payer: Medicare Other

## 2018-04-12 ENCOUNTER — Encounter (HOSPITAL_COMMUNITY): Payer: Self-pay | Admitting: Internal Medicine

## 2018-04-12 VITALS — BP 109/60 | HR 63 | Temp 97.6°F | Resp 16 | Wt 186.6 lb

## 2018-04-12 VITALS — BP 110/58 | HR 58 | Temp 97.8°F | Resp 16

## 2018-04-12 DIAGNOSIS — I2782 Chronic pulmonary embolism: Secondary | ICD-10-CM

## 2018-04-12 DIAGNOSIS — J449 Chronic obstructive pulmonary disease, unspecified: Secondary | ICD-10-CM

## 2018-04-12 DIAGNOSIS — D7589 Other specified diseases of blood and blood-forming organs: Secondary | ICD-10-CM

## 2018-04-12 DIAGNOSIS — Z87891 Personal history of nicotine dependence: Secondary | ICD-10-CM

## 2018-04-12 DIAGNOSIS — E119 Type 2 diabetes mellitus without complications: Secondary | ICD-10-CM

## 2018-04-12 DIAGNOSIS — Z5111 Encounter for antineoplastic chemotherapy: Secondary | ICD-10-CM | POA: Diagnosis not present

## 2018-04-12 DIAGNOSIS — Z95828 Presence of other vascular implants and grafts: Secondary | ICD-10-CM

## 2018-04-12 DIAGNOSIS — Z952 Presence of prosthetic heart valve: Secondary | ICD-10-CM

## 2018-04-12 DIAGNOSIS — I10 Essential (primary) hypertension: Secondary | ICD-10-CM | POA: Diagnosis not present

## 2018-04-12 DIAGNOSIS — I8001 Phlebitis and thrombophlebitis of superficial vessels of right lower extremity: Secondary | ICD-10-CM | POA: Diagnosis not present

## 2018-04-12 DIAGNOSIS — C184 Malignant neoplasm of transverse colon: Secondary | ICD-10-CM

## 2018-04-12 DIAGNOSIS — E7800 Pure hypercholesterolemia, unspecified: Secondary | ICD-10-CM

## 2018-04-12 LAB — URINALYSIS, DIPSTICK ONLY
Bilirubin Urine: NEGATIVE
Glucose, UA: NEGATIVE mg/dL
Hgb urine dipstick: NEGATIVE
Ketones, ur: NEGATIVE mg/dL
Nitrite: NEGATIVE
Protein, ur: NEGATIVE mg/dL
Specific Gravity, Urine: 1.02 (ref 1.005–1.030)
pH: 5 (ref 5.0–8.0)

## 2018-04-12 LAB — CBC WITH DIFFERENTIAL/PLATELET
Abs Immature Granulocytes: 0.03 10*3/uL (ref 0.00–0.07)
Basophils Absolute: 0.1 10*3/uL (ref 0.0–0.1)
Basophils Relative: 1 %
Eosinophils Absolute: 0.3 10*3/uL (ref 0.0–0.5)
Eosinophils Relative: 3 %
HCT: 37.8 % — ABNORMAL LOW (ref 39.0–52.0)
Hemoglobin: 11.4 g/dL — ABNORMAL LOW (ref 13.0–17.0)
Immature Granulocytes: 0 %
Lymphocytes Relative: 37 %
Lymphs Abs: 3.3 10*3/uL (ref 0.7–4.0)
MCH: 32 pg (ref 26.0–34.0)
MCHC: 30.2 g/dL (ref 30.0–36.0)
MCV: 106.2 fL — ABNORMAL HIGH (ref 80.0–100.0)
Monocytes Absolute: 1.4 10*3/uL — ABNORMAL HIGH (ref 0.1–1.0)
Monocytes Relative: 16 %
Neutro Abs: 3.9 10*3/uL (ref 1.7–7.7)
Neutrophils Relative %: 43 %
Platelets: 199 10*3/uL (ref 150–400)
RBC: 3.56 MIL/uL — ABNORMAL LOW (ref 4.22–5.81)
RDW: 15.2 % (ref 11.5–15.5)
WBC: 9 10*3/uL (ref 4.0–10.5)
nRBC: 0 % (ref 0.0–0.2)

## 2018-04-12 LAB — COMPREHENSIVE METABOLIC PANEL
ALT: 19 U/L (ref 0–44)
AST: 25 U/L (ref 15–41)
Albumin: 3.7 g/dL (ref 3.5–5.0)
Alkaline Phosphatase: 38 U/L (ref 38–126)
Anion gap: 8 (ref 5–15)
BUN: 14 mg/dL (ref 8–23)
CO2: 26 mmol/L (ref 22–32)
Calcium: 9.4 mg/dL (ref 8.9–10.3)
Chloride: 104 mmol/L (ref 98–111)
Creatinine, Ser: 1 mg/dL (ref 0.61–1.24)
GFR calc Af Amer: >60 mL/min (ref >60)
GFR calc non Af Amer: >60 mL/min (ref >60)
Glucose, Bld: 147 mg/dL — ABNORMAL HIGH (ref 70–99)
Potassium: 5 mmol/L (ref 3.5–5.1)
Sodium: 138 mmol/L (ref 135–145)
Total Bilirubin: 0.6 mg/dL (ref 0.3–1.2)
Total Protein: 6.8 g/dL (ref 6.5–8.1)

## 2018-04-12 MED ORDER — SODIUM CHLORIDE 0.9 % IV SOLN
INTRAVENOUS | Status: DC
  Administered 2018-04-12: 10:00:00 via INTRAVENOUS

## 2018-04-12 MED ORDER — FLUOROURACIL CHEMO INJECTION 2.5 GM/50ML
320.0000 mg/m2 | Freq: Once | INTRAVENOUS | Status: AC
  Administered 2018-04-12: 650 mg via INTRAVENOUS
  Filled 2018-04-12: qty 13

## 2018-04-12 MED ORDER — LEUCOVORIN CALCIUM INJECTION 350 MG
700.0000 mg | Freq: Once | INTRAVENOUS | Status: AC
  Administered 2018-04-12: 700 mg via INTRAVENOUS
  Filled 2018-04-12: qty 35

## 2018-04-12 MED ORDER — SODIUM CHLORIDE 0.9 % IV SOLN
1920.0000 mg/m2 | INTRAVENOUS | Status: DC
  Administered 2018-04-12: 3950 mg via INTRAVENOUS
  Filled 2018-04-12: qty 79

## 2018-04-12 MED ORDER — ATROPINE SULFATE 1 MG/ML IJ SOLN
0.5000 mg | Freq: Once | INTRAMUSCULAR | Status: AC
  Administered 2018-04-12: 0.5 mg via INTRAVENOUS
  Filled 2018-04-12: qty 1

## 2018-04-12 MED ORDER — SODIUM CHLORIDE 0.9 % IV SOLN
10.0000 mg | Freq: Once | INTRAVENOUS | Status: AC
  Administered 2018-04-12: 10 mg via INTRAVENOUS
  Filled 2018-04-12: qty 1

## 2018-04-12 MED ORDER — PALONOSETRON HCL INJECTION 0.25 MG/5ML
0.2500 mg | Freq: Once | INTRAVENOUS | Status: AC
  Administered 2018-04-12: 0.25 mg via INTRAVENOUS
  Filled 2018-04-12: qty 5

## 2018-04-12 MED ORDER — SODIUM CHLORIDE 0.9 % IV SOLN
Freq: Once | INTRAVENOUS | Status: AC
  Administered 2018-04-12: 10:00:00 via INTRAVENOUS

## 2018-04-12 MED ORDER — IRINOTECAN HCL CHEMO INJECTION 100 MG/5ML
144.0000 mg/m2 | Freq: Once | INTRAVENOUS | Status: AC
  Administered 2018-04-12: 300 mg via INTRAVENOUS
  Filled 2018-04-12: qty 15

## 2018-04-12 MED ORDER — SODIUM CHLORIDE 0.9% FLUSH
10.0000 mL | INTRAVENOUS | Status: DC | PRN
  Administered 2018-04-12: 10 mL
  Filled 2018-04-12: qty 10

## 2018-04-12 MED ORDER — HEPARIN SOD (PORK) LOCK FLUSH 100 UNIT/ML IV SOLN
500.0000 [IU] | Freq: Once | INTRAVENOUS | Status: DC | PRN
  Filled 2018-04-12: qty 5

## 2018-04-12 MED ORDER — SODIUM CHLORIDE 0.9 % IV SOLN
400.0000 mg | Freq: Once | INTRAVENOUS | Status: AC
  Administered 2018-04-12: 400 mg via INTRAVENOUS
  Filled 2018-04-12: qty 16

## 2018-04-12 NOTE — Progress Notes (Signed)
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[place]) - Plan: CBC with Differential/Platelet, Comprehensive metabolic panel, Lactate dehydrogenase, Urinalysis, dipstick only, CT CHEST W CONTRAST, CT ABDOMEN PELVIS W CONTRAST, DISCONTINUED: 0.9 %  sodium chloride infusion, DISCONTINUED: sodium chloride flush (NS) 0.9 % injection 10 mL, DISCONTINUED: heparin lock flush 100 unit/mL, DISCONTINUED: atropine injection 0.5 mg, DISCONTINUED: palonosetron (ALOXI) injection 0.25 mg, DISCONTINUED: dexamethasone (DECADRON) 10 mg in sodium chloride 0.9 % 50 mL IVPB, DISCONTINUED: bevacizumab (AVASTIN) 400 mg in sodium chloride 0.9 % 100 mL chemo infusion, DISCONTINUED: irinotecan (CAMPTOSAR) 300 mg in dextrose 5 % 500 mL chemo infusion, DISCONTINUED: leucovorin 700 mg in dextrose 5 % 250 mL infusion, DISCONTINUED: fluorouracil (ADRUCIL) chemo injection 650 mg, DISCONTINUED: fluorouracil (ADRUCIL) 3,950 mg in sodium chloride 0.9 % 71 mL chemo infusion  Staging Cancer Staging 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HCC) Staging form: Colon and Rectum, AJCC 8th Edition - Clinical: cT3, cN1a, cM1 - Signed by Twana First, MD on 03/09/2017 - Pathologic: No stage assigned - Unsigned   Assessment and Plan:  1.  Stage IV colon cancer (pT3 pN1 M1) MSI -High: -K-ras mutation negative, BRAF V600E positive, PIK3CA, CDKN2A,TP53  [name] treated with immunotherapy with nivolumab for diagnosis of colon cancer under the direction of Dr Lebron Conners.  He had been on Nivo since 02/23/2017.    Due to progression on .PET scan done 07/20/2017,Pt  was presented options of therapy with Folfiri and Avastin.   Pt will be treated with 14f 400 mg/m2 with 5 fu CIVI 2400 mg/m2 over 46 hours, Leucovorin 400 mg/m2, Irinotecan 180 mg/m2 with Avastin 5 mg/kg  Every 2 weeks.  Side effects of the medication were reviewed with pt including primarily bleeding and diarrhea.   CT CAP done 10/12/2017 showed   IMPRESSION: 1. Stable scattered  mediastinal lymph nodes. No new or progressive findings. 2. No findings for pulmonary metastatic disease. 3. No hepatic metastatic disease. 4. The left-sided omental nodule has decreased in size since the prior PET-CT. 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 5. Small scattered retroperitoneal and pelvic lymph nodes. No new or progressive findings. 6. Prostate gland enlargement, stable. 7. Cholelithiasis.  CT CAP done 02/04/2018 reviewed and showed  IMPRESSION: 1. Mild interval increase in size of a few of the mediastinal lymph nodes. Additional mediastinal hilar nodes are stable.  Node went from 0.9 cm to 1 cm.   2. New nodular area of consolidation within the right upper lobe which may be infectious/inflammatory in etiology. There is an adjacent new right upper lobe nodule. Recommend attention on follow-up. 3. Similar-appearing left upper quadrant omental nodule. Similar-appearing nodule within the right hemipelvis. 4. Multiple stones within the gallbladder lumen. Mild gallbladder wall thickening with surrounding fat stranding. Cholecystitis is not excluded. Recommend clinical and laboratory correlation.  Overall, picture is stable.   Labs done 04/12/2018 reviewed and showed WBC 9 HB 11.4 plts 199,000.  Chemistries WNL with K+ 5 Cr 1 and normal LFTs.  UA negative for protein.  He will proceed with C19  of Folfiri and Avastin.  Pt will continue therapy as directed.  He is set up for imaging with CT CAP in 04/2018.  He will follow-up at that time to go over results.    2.  Chest congestion.  Inflammation on recent scan.  He was treated with Levaquin and reports symptoms have improved.  He should notify the office if any change in respiratory status.  3.  Diarrhea.  He reports improvement in symptoms.  Pt advised to use antidiarrheal medications as recommended.   4.  Macrocytosis.  B12 elevated.  MMA levels normal.   HB stable at  11.4.  Will repeat labs in 2 weeks.    5.  Superficial thrombophlebitis.  RLE doppler done 11/02/2017 reviewed and showed IMPRESSION: 1.  No evidence of right lower extremity deep vein thrombosis. 2. Superficial thrombophlebitis of the right great saphenous vein in the calf.  Aleve prn pain.    6.  IDA.  Patient has been treated with IV iron in the past. HB 11.4  Ferritin 185 in 02/2018.  Will continue to monitor.    7.  Hypertension.  BP is 109/60.  Follow-up with PCP.    8.  Family history of GI cancers.  He reports this occurred in his brother.  Pt has  MSI High, BRAF + tumor.    25 minutes spent with more than 50% spent in counseling and coordination of care.    Interval History:   85 y.o. Treated with nivolumab for diagnosis of recurrent colon cancer under the direction of Dr Lebron Conners.   Pt now on Folfiri and Avastin.    Current Status:  Pt is seen today for follow-up prior to C19 of Folfiri and Avastin.  He reports diarrhea has resolved.  Pt doing well.      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[place])    07/27/2017 -  Chemotherapy    The patient had palonosetron (ALOXI) injection 0.25 mg, 0.25 mg, Intravenous,  Once, 19 of 20 cycles Administration: 0.25 mg (07/27/2017), 0.25 mg (08/10/2017), 0.25 mg (08/24/2017), 0.25 mg (09/07/2017), 0.25 mg (09/21/2017), 0.25 mg (10/05/2017), 0.25 mg (10/19/2017), 0.25 mg (11/02/2017), 0.25 mg (11/16/2017), 0.25 mg (11/30/2017), 0.25 mg (12/14/2017), 0.25 mg (12/28/2017), 0.25 mg (01/12/2018), 0.25 mg (01/26/2018), 0.25 mg (02/09/2018), 0.25 mg (02/24/2018), 0.25 mg (03/10/2018), 0.25 mg (03/29/2018) pegfilgrastim-cbqv (UDENYCA) injection 6 mg, 6 mg, Subcutaneous, Once, 7 of 7 cycles Administration: 6 mg (10/07/2017), 6 mg (10/21/2017), 6 mg (11/04/2017), 6 mg (11/18/2017), 6 mg (12/02/2017), 6 mg (12/16/2017), 6 mg (12/30/2017) bevacizumab (AVASTIN) 450 mg in sodium chloride 0.9 %  100 mL chemo infusion, 5 mg/kg = 450 mg, Intravenous,  Once, 19 of 20 cycles Dose modification: 4 mg/kg (80 % of original dose 5 mg/kg, Cycle 5, Reason: Provider Judgment), 5 mg/kg (original dose 5 mg/kg, Cycle 16, Reason: Other (see comments)) Administration: 450 mg (07/27/2017), 450 mg (08/10/2017), 450 mg (08/24/2017), 450 mg (09/07/2017), 450 mg (09/21/2017), 450 mg (10/05/2017), 450 mg (10/19/2017), 450 mg (11/02/2017), 450 mg (11/16/2017), 450 mg (11/30/2017), 450 mg (12/14/2017), 450 mg (12/28/2017), 400 mg (01/12/2018), 400 mg (01/26/2018), 400 mg (02/09/2018), 400 mg (02/24/2018), 400 mg (03/10/2018), 400 mg (03/29/2018) irinotecan (CAMPTOSAR) 400 mg in dextrose 5 % 500 mL chemo infusion, 180 mg/m2 = 400 mg, Intravenous,  Once, 19 of 20 cycles Dose modification: 144 mg/m2 (80 % of original dose 180 mg/m2, Cycle 5, Reason: Provider Judgment) Administration: 400 mg (07/27/2017), 400 mg (08/10/2017), 400 mg (08/24/2017), 400 mg (09/07/2017), 320 mg (09/21/2017), 320 mg (10/05/2017), 320 mg (10/19/2017), 320 mg (11/02/2017), 320 mg (11/16/2017), 320 mg (11/30/2017), 320 mg (12/14/2017), 320 mg (12/28/2017), 320 mg (01/12/2018), 320 mg (01/26/2018), 320 mg (02/09/2018), 300 mg (02/24/2018), 300 mg (03/10/2018), 300 mg (03/29/2018) leucovorin 800 mg in dextrose 5 % 250 mL infusion, 872 mg, Intravenous,  Once, 19 of 20 cycles  Dose modification: 320 mg/m2 (80 % of original dose 400 mg/m2, Cycle 5, Reason: Provider Judgment) Administration: 800 mg (07/27/2017), 800 mg (08/10/2017), 800 mg (08/24/2017), 800 mg (09/07/2017), 700 mg (09/21/2017), 700 mg (10/05/2017), 700 mg (10/19/2017), 700 mg (11/02/2017), 700 mg (11/16/2017), 700 mg (11/30/2017), 700 mg (12/14/2017), 700 mg (12/28/2017), 700 mg (01/12/2018), 700 mg (01/26/2018), 700 mg (02/09/2018), 700 mg (02/24/2018), 700 mg (03/10/2018), 700 mg (03/29/2018) fluorouracil (ADRUCIL) chemo injection 850 mg, 400 mg/m2 = 850 mg, Intravenous,  Once, 19 of 20 cycles Dose modification: 320 mg/m2 (80 % of original dose  400 mg/m2, Cycle 5, Reason: Provider Judgment) Administration: 850 mg (07/27/2017), 850 mg (08/10/2017), 850 mg (08/24/2017), 850 mg (09/07/2017), 700 mg (09/21/2017), 700 mg (10/05/2017), 700 mg (10/19/2017), 700 mg (11/02/2017), 700 mg (11/16/2017), 700 mg (11/30/2017), 700 mg (12/14/2017), 700 mg (12/28/2017), 700 mg (01/12/2018), 700 mg (01/26/2018), 700 mg (02/09/2018), 650 mg (02/24/2018), 650 mg (03/10/2018), 650 mg (03/29/2018) fluorouracil (ADRUCIL) 5,250 mg in sodium chloride 0.9 % 145 mL chemo infusion, 2,400 mg/m2 = 5,250 mg, Intravenous, 1 Day/Dose, 19 of 20 cycles Dose modification: 1,920 mg/m2 (80 % of original dose 2,400 mg/m2, Cycle 5, Reason: Provider Judgment) Administration: 5,250 mg (07/27/2017), 5,250 mg (08/10/2017), 5,250 mg (08/24/2017), 5,250 mg (09/07/2017), 4,200 mg (09/21/2017), 4,200 mg (10/05/2017), 4,200 mg (10/19/2017), 4,200 mg (11/02/2017), 4,200 mg (11/16/2017), 4,200 mg (11/30/2017), 4,200 mg (12/14/2017), 4,200 mg (12/28/2017), 4,200 mg (01/12/2018), 4,200 mg (01/26/2018), 4,200 mg (02/09/2018), 3,950 mg (02/24/2018), 3,950 mg (03/10/2018), 3,950 mg (03/29/2018)  for chemotherapy treatment.       Problem List Patient Active Problem List   Diagnosis Date Noted  . Port-A-Cath in place [facility] 05/18/2017  . Iron deficiency anemia [D50.9] 09/07/2016  . S/P partial colectomy [Z90.49] 07/11/2016  . 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 High cholesterol [E78.00] 04/22/2016  . H/O aortic valve replacement [Z95.2] 04/22/2016  . Diabetes ([place]) [E11.9] 04/22/2016  . Chronic pulmonary embolism ([place]) [I27.82] 04/22/2016  . Rectal bleeding [K62.5] 04/22/2016    Past Medical History Past Medical History:  Diagnosis Date  . Chronic pulmonary embolism ([place]) 53/6644   compliction of the valve replacement surgery  . COPD (chronic obstructive pulmonary disease) ([place])   . Diabetes ([place]) 04/22/2016  . H/O aortic valve replacement 10/2008  . Hypercholesteremia   . Hypertension   .  Pulmonary emboli ([place]) 10/2008  . Sleep apnea     Past Surgical History Past Surgical History:  Procedure Laterality Date  . AORTIC VALVE REPLACEMENT     2010  . CARDIAC CATHETERIZATION  2010  . COLONOSCOPY N/A 06/05/2016   Procedure: COLONOSCOPY;  Surgeon: Rogene Houston, MD;  Location: [facility] ENDO SUITE;  Service: Endoscopy;  Laterality: N/A;  . PARTIAL COLECTOMY N/A 07/11/2016   Procedure: PARTIAL COLECTOMY;  Surgeon: Aviva Signs, MD;  Location: [facility] ORS;  Service: General;  Laterality: N/A;  . PORTACATH PLACEMENT N/A 08/13/2016   Procedure: INSERTION PORT-A-CATH LEFT SUBCLAVIAN;  Surgeon: Aviva Signs, MD;  Location: [facility] ORS;  Service: General;  Laterality: N/A;  . REPLACEMENT TOTAL KNEE     1996  . spenectomy     from trauma    Family History Family History  Problem Relation Age of Onset  . Colon cancer Neg Hx      Social History  reports that he quit smoking about 56 years ago. His smoking use included cigarettes. He quit after 15.00 years of use. He has never used smokeless tobacco. He reports that he does not drink  alcohol or use drugs.  Medications  Current Outpatient Medications:  Marland Kitchen  Multiple Vitamins-Minerals (CENTRUM SILVER 50+MEN PO), Take 1 tablet by mouth daily., Disp: , Rfl:  .  atorvastatin (LIPITOR) 10 MG tablet, Take 10 mg by mouth at bedtime. , Disp: , Rfl:  .  Cholecalciferol (VITAMIN D3) 5000 units CAPS, Take 5,000 Units by mouth daily. , Disp: , Rfl:  .  diphenoxylate-atropine (LOMOTIL) 2.5-0.025 MG tablet, Take 1 tablet by mouth 4 (four) times daily as needed for diarrhea or loose stools., Disp: 60 tablet, Rfl: 3 .  gabapentin (NEURONTIN) 600 MG tablet, TAKE 1 TABLET BY MOUTH THREE TIMES DAILY (Patient taking differently: Take 600 mg by mouth 2 (two) times daily. ), Disp: 90 tablet, Rfl: 0 .  metoprolol (LOPRESSOR) 50 MG tablet, Take 50 mg by mouth 2 (two) times daily., Disp: , Rfl:  .  vitamin B-12 (CYANOCOBALAMIN) 1000 MCG tablet, Take 1,000 mcg by mouth  daily. , Disp: , Rfl:  No current facility-administered medications for this visit.   Facility-Administered Medications Ordered in Other Visits:  .  0.9 %  sodium chloride infusion, , Intravenous, Continuous, Miki Blank, MD, Last Rate: 20 mL/hr at 04/12/18 1023 .  fluorouracil (ADRUCIL) 3,950 mg in sodium chloride 0.9 % 71 mL chemo infusion, 1,920 mg/m2 (Treatment Plan Recorded), Intravenous, 1 day or 1 dose, Emillie Chasen, MD .  fluorouracil (ADRUCIL) chemo injection 650 mg, 320 mg/m2 (Treatment Plan Recorded), Intravenous, Once, Kycen Spalla, MD .  heparin lock flush 100 unit/mL, 500 Units, Intracatheter, Once PRN, Tristian Sickinger, Mathis Dad, MD .  irinotecan (CAMPTOSAR) 300 mg in dextrose 5 % 500 mL chemo infusion, 144 mg/m2 (Treatment Plan Recorded), Intravenous, Once, Sabrin Dunlevy, MD .  leucovorin 700 mg in dextrose 5 % 250 mL infusion, 700 mg, Intravenous, Once, Serjio Deupree, MD .  sodium chloride flush (NS) 0.9 % injection 10 mL, 10 mL, Intracatheter, PRN, Margarite Vessel, MD, 10 mL at 04/12/18 1009  Allergies Amoxicillin and Tamsulosin hcl  Review of Systems Review of Systems - Oncology ROS negative   Physical Exam  Vitals Wt Readings from Last 3 Encounters:  04/12/18 186 lb 9.6 oz (84.6 kg)  03/29/18 185 lb 3.2 oz (84 kg)  03/10/18 182 lb (82.6 kg)   Temp Readings from Last 3 Encounters:  04/12/18 97.6 F (36.4 C) (Oral)  03/31/18 98 F (36.7 C) (Oral)  03/29/18 (!) 97.5 F (36.4 C) (Oral)   BP Readings from Last 3 Encounters:  04/12/18 109/60  03/31/18 136/64  03/29/18 (!) 140/57   Pulse Readings from Last 3 Encounters:  04/12/18 63  03/31/18 (!) 57  03/29/18 (!) 51   Constitutional: Well-developed, well-nourished, and in no distress.   HENT: Head: Normocephalic and atraumatic.  Mouth/Throat: No oropharyngeal exudate. Mucosa moist. Eyes: Pupils are equal, round, and reactive to light. Conjunctivae are normal. No scleral icterus.  Neck: Normal range of motion. Neck supple.  No JVD present.  Cardiovascular: Normal rate, regular rhythm and normal heart sounds.  Exam reveals no gallop and no friction rub.   No murmur heard. Pulmonary/Chest: Effort normal and breath sounds normal. No respiratory distress. No wheezes.No rales.  Abdominal: Soft. Bowel sounds are normal. No distension. There is no tenderness. There is no guarding.  Musculoskeletal: No edema or tenderness.  Lymphadenopathy: No cervical, axillary or supraclavicular adenopathy.  Neurological: Alert and oriented to person, place, and time. No cranial nerve deficit.  Skin: Skin is warm and dry. No rash noted. No erythema. No pallor.  Psychiatric: Affect  and judgment normal.   Labs Appointment on 04/12/2018  Component Date Value Ref Range Status  . WBC 04/12/2018 9.0  4.0 - 10.5 K/uL Final  . RBC 04/12/2018 3.56* 4.22 - 5.81 MIL/uL Final  . Hemoglobin 04/12/2018 11.4* 13.0 - 17.0 g/dL Final  . HCT 04/12/2018 37.8* 39.0 - 52.0 % Final  . MCV 04/12/2018 106.2* 80.0 - 100.0 fL Final  . MCH 04/12/2018 32.0  26.0 - 34.0 pg Final  . MCHC 04/12/2018 30.2  30.0 - 36.0 g/dL Final  . RDW 04/12/2018 15.2  11.5 - 15.5 % Final  . Platelets 04/12/2018 199  150 - 400 K/uL Final  . nRBC 04/12/2018 0.0  0.0 - 0.2 % Final  . Neutrophils Relative % 04/12/2018 43  % Final  . Neutro Abs 04/12/2018 3.9  1.7 - 7.7 K/uL Final  . Lymphocytes Relative 04/12/2018 37  % Final  . Lymphs Abs 04/12/2018 3.3  0.7 - 4.0 K/uL Final  . Monocytes Relative 04/12/2018 16  % Final  . Monocytes Absolute 04/12/2018 1.4* 0.1 - 1.0 K/uL Final  . Eosinophils Relative 04/12/2018 3  % Final  . Eosinophils Absolute 04/12/2018 0.3  0.0 - 0.5 K/uL Final  . Basophils Relative 04/12/2018 1  % Final  . Basophils Absolute 04/12/2018 0.1  0.0 - 0.1 K/uL Final  . Immature Granulocytes 04/12/2018 0  % Final  . Abs Immature Granulocytes 04/12/2018 0.03  0.00 - 0.07 K/uL Final   Performed at [facility], [address], [place], [place] [address]  .  Sodium 04/12/2018 138  135 - 145 mmol/L Final  . Potassium 04/12/2018 5.0  3.5 - 5.1 mmol/L Final  . Chloride 04/12/2018 104  98 - 111 mmol/L Final  . CO2 04/12/2018 26  22 - 32 mmol/L Final  . Glucose, Bld 04/12/2018 147* 70 - 99 mg/dL Final  . BUN 04/12/2018 14  8 - 23 mg/dL Final  . Creatinine, Ser 04/12/2018 1.00  0.61 - 1.24 mg/dL Final  . Calcium 04/12/2018 9.4  8.9 - 10.3 mg/dL Final  . Total Protein 04/12/2018 6.8  6.5 - 8.1 g/dL Final  . Albumin 04/12/2018 3.7  3.5 - 5.0 g/dL Final  . AST 04/12/2018 25  15 - 41 U/L Final  . ALT 04/12/2018 19  0 - 44 U/L Final  . Alkaline Phosphatase 04/12/2018 38  38 - 126 U/L Final  . Total Bilirubin 04/12/2018 0.6  0.3 - 1.2 mg/dL Final  . GFR calc non Af Amer 04/12/2018 >60  >60 mL/min Final  . GFR calc Af Amer 04/12/2018 >60  >60 mL/min Final  . Anion gap 04/12/2018 8  5 - 15 Final   Performed at [facility], [address] [place], [place] [address]  . Color, Urine 04/12/2018 YELLOW  YELLOW Final  . APPearance 04/12/2018 HAZY* CLEAR Final  . Specific Gravity, Urine 04/12/2018 1.020  1.005 - 1.030 Final  . pH 04/12/2018 5.0  5.0 - 8.0 Final  . Glucose, UA 04/12/2018 NEGATIVE  NEGATIVE mg/dL Final  . Hgb urine dipstick 04/12/2018 NEGATIVE  NEGATIVE Final  . Bilirubin Urine 04/12/2018 NEGATIVE  NEGATIVE Final  . Ketones, ur 04/12/2018 NEGATIVE  NEGATIVE mg/dL Final  . Protein, ur 04/12/2018 NEGATIVE  NEGATIVE mg/dL Final  . Nitrite 04/12/2018 NEGATIVE  NEGATIVE Final  . Leukocytes, UA 04/12/2018 TRACE* NEGATIVE Final   Performed at [facility], [address] [place], [place] [address]     Pathology Orders Placed This Encounter  Procedures  . CT  CHEST W CONTRAST    Standing Status:   Future    Standing Expiration Date:   04/12/2019    Order Specific Question:   If indicated for the ordered procedure, I authorize the administration of contrast media per Radiology protocol    Answer:   Yes    Order Specific Question:   Preferred  imaging location?    Answer:   [facility]    Order Specific Question:   Radiology Contrast Protocol - do NOT remove file path    Answer:   [email or web address]  . CT ABDOMEN PELVIS W CONTRAST    Standing Status:   Future    Standing Expiration Date:   04/12/2019    Order Specific Question:   If indicated for the ordered procedure, I authorize the administration of contrast media per Radiology protocol    Answer:   Yes    Order Specific Question:   Preferred imaging location?    Answer:   [facility]    Order Specific Question:   Is Oral Contrast requested for this exam?    Answer:   Yes, Per Radiology protocol    Order Specific Question:   Radiology Contrast Protocol - do NOT remove file path    Answer:   [email or web address]  . CBC with Differential/Platelet    Standing Status:   Future    Standing Expiration Date:   04/13/2019  . Comprehensive metabolic panel    Standing Status:   Future    Standing Expiration Date:   04/13/2019  . Lactate dehydrogenase    Standing Status:   Future    Standing Expiration Date:   04/13/2019  . Urinalysis, dipstick only    Standing Status:   Future    Standing Expiration Date:   04/13/2019       Zoila Shutter MD

## 2018-04-12 NOTE — Progress Notes (Signed)
Patient seen by Dr. Walden Field with lab review and ok to treat today.   Patient tolerated chemotherapy with no complaints voiced.  Port site clean and dry with no bruising or swelling noted at site.  Good blood return noted before and after administration of chemotherapy.  Chemotherapy pump connected with no alarms noted.  Dressing intact. Patient left ambulatory with VSS and no s/s of distress noted.

## 2018-04-12 NOTE — Patient Instructions (Signed)
[facility] at [facility]  Discharge Instructions: You saw Dr. Higgs today                               _______________________________________________________________  Thank you for choosing [facility] at [facility] to provide your oncology and hematology care.  To afford each patient quality time with our providers, please arrive at least 15 minutes before your scheduled appointment.  You need to re-schedule your appointment if you arrive 10 or more minutes late.  We strive to give you quality time with our providers, and arriving late affects you and other patients whose appointments are after yours.  Also, if you no show three or more times for appointments you may be dismissed from the clinic.  Again, thank you for choosing  [facility]. Our hope is that these requests will allow you access to exceptional care and in a timely manner. _______________________________________________________________  If you have questions after your visit, please contact our office at [phone] between the hours of 8:30 a.m. and 5:00 p.m. Voicemails left after 4:30 p.m. will not be returned until the following business day. _______________________________________________________________  For prescription refill requests, have your pharmacy contact our office. _______________________________________________________________  Recommendations made by the consultant and any test results will be sent to your referring physician. _______________________________________________________________ 

## 2018-04-12 NOTE — Patient Instructions (Signed)
[facility] Discharge Instructions for Patients Receiving Chemotherapy  Today you received the following chemotherapy agents  If you develop nausea and vomiting that is not controlled by your nausea medication, call the clinic.   BELOW ARE SYMPTOMS THAT SHOULD BE REPORTED IMMEDIATELY:  *FEVER GREATER THAN 100.5 F  *CHILLS WITH OR WITHOUT FEVER  NAUSEA AND VOMITING THAT IS NOT CONTROLLED WITH YOUR NAUSEA MEDICATION  *UNUSUAL SHORTNESS OF BREATH  *UNUSUAL BRUISING OR BLEEDING  TENDERNESS IN MOUTH AND THROAT WITH OR WITHOUT PRESENCE OF ULCERS  *URINARY PROBLEMS  *BOWEL PROBLEMS  UNUSUAL RASH Items with * indicate a potential emergency and should be followed up as soon as possible.  Feel free to call the clinic should you have any questions or concerns. The clinic phone number is [phone].  Please show the CHEMO ALERT CARD at check-in to the Emergency Department and triage nurse.   

## 2018-04-13 MED ORDER — CYANOCOBALAMIN 1000 MCG/ML IJ SOLN
INTRAMUSCULAR | Status: AC
  Filled 2018-04-13: qty 1

## 2018-04-14 ENCOUNTER — Inpatient Hospital Stay (HOSPITAL_COMMUNITY): Payer: Medicare Other

## 2018-04-14 VITALS — BP 119/63 | HR 55 | Temp 97.8°F | Resp 18

## 2018-04-14 DIAGNOSIS — Z95828 Presence of other vascular implants and grafts: Secondary | ICD-10-CM

## 2018-04-14 DIAGNOSIS — C184 Malignant neoplasm of transverse colon: Secondary | ICD-10-CM

## 2018-04-14 DIAGNOSIS — Z5111 Encounter for antineoplastic chemotherapy: Secondary | ICD-10-CM | POA: Diagnosis not present

## 2018-04-14 MED ORDER — SODIUM CHLORIDE 0.9% FLUSH
10.0000 mL | INTRAVENOUS | Status: DC | PRN
  Administered 2018-04-14: 10 mL
  Filled 2018-04-14: qty 10

## 2018-04-14 MED ORDER — HEPARIN SOD (PORK) LOCK FLUSH 100 UNIT/ML IV SOLN
500.0000 [IU] | Freq: Once | INTRAVENOUS | Status: AC | PRN
  Administered 2018-04-14: 500 [IU]

## 2018-04-14 NOTE — Patient Instructions (Signed)
[facility] at [facility]  Discharge Instructions:   _______________________________________________________________  Thank you for choosing [facility] at [facility] to provide your oncology and hematology care.  To afford each patient quality time with our providers, please arrive at least 15 minutes before your scheduled appointment.  You need to re-schedule your appointment if you arrive 10 or more minutes late.  We strive to give you quality time with our providers, and arriving late affects you and other patients whose appointments are after yours.  Also, if you no show three or more times for appointments you may be dismissed from the clinic.  Again, thank you for choosing [facility]. Our hope is that these requests will allow you access to exceptional care and in a timely manner. _______________________________________________________________  If you have questions after your visit, please contact our office at [phone] between the hours of 8:30 a.m. and 5:00 p.m. Voicemails left after 4:30 p.m. will not be returned until the following business day. _______________________________________________________________  For prescription refill requests, have your pharmacy contact our office. _______________________________________________________________  Recommendations made by the consultant and any test results will be sent to your referring physician. _______________________________________________________________ 

## 2018-04-14 NOTE — Progress Notes (Signed)
Kevin Kirk returns today for port de access and flush after 46 hr continous infusion of 35fu. Tolerated infusion without problems. Portacath located left chest wall was  deaccessed and flushed with 59ml NS and 500U/72ml Heparin and needle removed intact.  Procedure without incident. Patient tolerated procedure well.  Patient tolerated it well without problems. Vitals stable and discharged home from clinic ambulatory. Follow up as scheduled.

## 2018-04-23 ENCOUNTER — Other Ambulatory Visit (HOSPITAL_COMMUNITY): Payer: Self-pay

## 2018-04-23 DIAGNOSIS — C184 Malignant neoplasm of transverse colon: Secondary | ICD-10-CM

## 2018-04-23 DIAGNOSIS — C189 Malignant neoplasm of colon, unspecified: Secondary | ICD-10-CM

## 2018-04-26 ENCOUNTER — Inpatient Hospital Stay (HOSPITAL_COMMUNITY): Payer: Medicare Other

## 2018-04-26 ENCOUNTER — Inpatient Hospital Stay (HOSPITAL_COMMUNITY): Payer: Medicare Other | Attending: Hematology

## 2018-04-26 ENCOUNTER — Other Ambulatory Visit (HOSPITAL_COMMUNITY): Payer: Self-pay | Admitting: Internal Medicine

## 2018-04-26 ENCOUNTER — Encounter (HOSPITAL_COMMUNITY): Payer: Self-pay

## 2018-04-26 ENCOUNTER — Other Ambulatory Visit: Payer: Self-pay

## 2018-04-26 VITALS — BP 144/67 | HR 60 | Temp 97.6°F | Resp 18 | Wt 181.9 lb

## 2018-04-26 DIAGNOSIS — Z87891 Personal history of nicotine dependence: Secondary | ICD-10-CM | POA: Diagnosis not present

## 2018-04-26 DIAGNOSIS — D7589 Other specified diseases of blood and blood-forming organs: Secondary | ICD-10-CM | POA: Diagnosis not present

## 2018-04-26 DIAGNOSIS — I2782 Chronic pulmonary embolism: Secondary | ICD-10-CM | POA: Diagnosis not present

## 2018-04-26 DIAGNOSIS — I8001 Phlebitis and thrombophlebitis of superficial vessels of right lower extremity: Secondary | ICD-10-CM | POA: Insufficient documentation

## 2018-04-26 DIAGNOSIS — J449 Chronic obstructive pulmonary disease, unspecified: Secondary | ICD-10-CM | POA: Diagnosis not present

## 2018-04-26 DIAGNOSIS — C786 Secondary malignant neoplasm of retroperitoneum and peritoneum: Secondary | ICD-10-CM | POA: Insufficient documentation

## 2018-04-26 DIAGNOSIS — C184 Malignant neoplasm of transverse colon: Secondary | ICD-10-CM | POA: Diagnosis not present

## 2018-04-26 DIAGNOSIS — Z952 Presence of prosthetic heart valve: Secondary | ICD-10-CM | POA: Diagnosis not present

## 2018-04-26 DIAGNOSIS — Z5111 Encounter for antineoplastic chemotherapy: Secondary | ICD-10-CM | POA: Insufficient documentation

## 2018-04-26 DIAGNOSIS — E1140 Type 2 diabetes mellitus with diabetic neuropathy, unspecified: Secondary | ICD-10-CM | POA: Insufficient documentation

## 2018-04-26 DIAGNOSIS — I10 Essential (primary) hypertension: Secondary | ICD-10-CM | POA: Insufficient documentation

## 2018-04-26 DIAGNOSIS — E7800 Pure hypercholesterolemia, unspecified: Secondary | ICD-10-CM | POA: Insufficient documentation

## 2018-04-26 DIAGNOSIS — C189 Malignant neoplasm of colon, unspecified: Secondary | ICD-10-CM

## 2018-04-26 DIAGNOSIS — Z95828 Presence of other vascular implants and grafts: Secondary | ICD-10-CM

## 2018-04-26 DIAGNOSIS — Z79899 Other long term (current) drug therapy: Secondary | ICD-10-CM | POA: Diagnosis not present

## 2018-04-26 LAB — COMPREHENSIVE METABOLIC PANEL
ALT: 18 U/L (ref 0–44)
AST: 23 U/L (ref 15–41)
Albumin: 3.7 g/dL (ref 3.5–5.0)
Alkaline Phosphatase: 42 U/L (ref 38–126)
Anion gap: 6 (ref 5–15)
BUN: 15 mg/dL (ref 8–23)
CO2: 25 mmol/L (ref 22–32)
Calcium: 8.9 mg/dL (ref 8.9–10.3)
Chloride: 108 mmol/L (ref 98–111)
Creatinine, Ser: 1.25 mg/dL — ABNORMAL HIGH (ref 0.61–1.24)
GFR calc Af Amer: >60 mL/min (ref >60)
GFR calc non Af Amer: 53 mL/min — ABNORMAL LOW (ref >60)
Glucose, Bld: 126 mg/dL — ABNORMAL HIGH (ref 70–99)
Potassium: 4.5 mmol/L (ref 3.5–5.1)
Sodium: 139 mmol/L (ref 135–145)
Total Bilirubin: 0.6 mg/dL (ref 0.3–1.2)
Total Protein: 6.8 g/dL (ref 6.5–8.1)

## 2018-04-26 LAB — CBC WITH DIFFERENTIAL/PLATELET
Abs Immature Granulocytes: 0.03 10*3/uL (ref 0.00–0.07)
Basophils Absolute: 0 10*3/uL (ref 0.0–0.1)
Basophils Relative: 0 %
Eosinophils Absolute: 0.3 10*3/uL (ref 0.0–0.5)
Eosinophils Relative: 3 %
HCT: 37.3 % — ABNORMAL LOW (ref 39.0–52.0)
Hemoglobin: 11.4 g/dL — ABNORMAL LOW (ref 13.0–17.0)
Immature Granulocytes: 0 %
Lymphocytes Relative: 35 %
Lymphs Abs: 3.2 10*3/uL (ref 0.7–4.0)
MCH: 32.3 pg (ref 26.0–34.0)
MCHC: 30.6 g/dL (ref 30.0–36.0)
MCV: 105.7 fL — ABNORMAL HIGH (ref 80.0–100.0)
Monocytes Absolute: 1.1 10*3/uL — ABNORMAL HIGH (ref 0.1–1.0)
Monocytes Relative: 12 %
Neutro Abs: 4.4 10*3/uL (ref 1.7–7.7)
Neutrophils Relative %: 50 %
Platelets: 203 10*3/uL (ref 150–400)
RBC: 3.53 MIL/uL — ABNORMAL LOW (ref 4.22–5.81)
RDW: 14.9 % (ref 11.5–15.5)
WBC: 9 10*3/uL (ref 4.0–10.5)
nRBC: 0 % (ref 0.0–0.2)

## 2018-04-26 LAB — URINALYSIS, DIPSTICK ONLY
Bilirubin Urine: NEGATIVE
Glucose, UA: NEGATIVE mg/dL
Hgb urine dipstick: NEGATIVE
Ketones, ur: NEGATIVE mg/dL
Leukocytes, UA: NEGATIVE
Nitrite: NEGATIVE
Protein, ur: NEGATIVE mg/dL
Specific Gravity, Urine: 1.02 (ref 1.005–1.030)
pH: 5 (ref 5.0–8.0)

## 2018-04-26 MED ORDER — PALONOSETRON HCL INJECTION 0.25 MG/5ML
0.2500 mg | Freq: Once | INTRAVENOUS | Status: AC
  Administered 2018-04-26: 0.25 mg via INTRAVENOUS

## 2018-04-26 MED ORDER — FLUOROURACIL CHEMO INJECTION 2.5 GM/50ML
320.0000 mg/m2 | Freq: Once | INTRAVENOUS | Status: AC
  Administered 2018-04-26: 650 mg via INTRAVENOUS
  Filled 2018-04-26: qty 13

## 2018-04-26 MED ORDER — SODIUM CHLORIDE 0.9 % IV SOLN
Freq: Once | INTRAVENOUS | Status: AC
  Administered 2018-04-26: 09:00:00 via INTRAVENOUS

## 2018-04-26 MED ORDER — SODIUM CHLORIDE 0.9 % IV SOLN
400.0000 mg | Freq: Once | INTRAVENOUS | Status: AC
  Administered 2018-04-26: 400 mg via INTRAVENOUS
  Filled 2018-04-26: qty 16

## 2018-04-26 MED ORDER — PALONOSETRON HCL INJECTION 0.25 MG/5ML
INTRAVENOUS | Status: AC
  Filled 2018-04-26: qty 5

## 2018-04-26 MED ORDER — IRINOTECAN HCL CHEMO INJECTION 100 MG/5ML
144.0000 mg/m2 | Freq: Once | INTRAVENOUS | Status: AC
  Administered 2018-04-26: 300 mg via INTRAVENOUS
  Filled 2018-04-26: qty 15

## 2018-04-26 MED ORDER — ATROPINE SULFATE 1 MG/ML IJ SOLN
INTRAMUSCULAR | Status: AC
  Filled 2018-04-26: qty 1

## 2018-04-26 MED ORDER — SODIUM CHLORIDE 0.9 % IV SOLN
10.0000 mg | Freq: Once | INTRAVENOUS | Status: AC
  Administered 2018-04-26: 10 mg via INTRAVENOUS
  Filled 2018-04-26: qty 10

## 2018-04-26 MED ORDER — SODIUM CHLORIDE 0.9 % IV SOLN
1920.0000 mg/m2 | INTRAVENOUS | Status: DC
  Administered 2018-04-26: 3950 mg via INTRAVENOUS
  Filled 2018-04-26: qty 79

## 2018-04-26 MED ORDER — ATROPINE SULFATE 1 MG/ML IJ SOLN
0.5000 mg | Freq: Once | INTRAMUSCULAR | Status: AC
  Administered 2018-04-26: 0.5 mg via INTRAVENOUS

## 2018-04-26 MED ORDER — SODIUM CHLORIDE 0.9 % IV SOLN
INTRAVENOUS | Status: DC
  Administered 2018-04-26: 11:00:00 via INTRAVENOUS

## 2018-04-26 MED ORDER — LEUCOVORIN CALCIUM INJECTION 350 MG
700.0000 mg | Freq: Once | INTRAVENOUS | Status: AC
  Administered 2018-04-26: 700 mg via INTRAVENOUS
  Filled 2018-04-26: qty 35

## 2018-04-26 NOTE — Patient Instructions (Signed)
[facility] Discharge Instructions for Patients Receiving Chemotherapy  Today you received the following chemotherapy agents   To help prevent nausea and vomiting after your treatment, we encourage you to take your nausea medication   If you develop nausea and vomiting that is not controlled by your nausea medication, call the clinic.   BELOW ARE SYMPTOMS THAT SHOULD BE REPORTED IMMEDIATELY:  *FEVER GREATER THAN 100.5 F  *CHILLS WITH OR WITHOUT FEVER  NAUSEA AND VOMITING THAT IS NOT CONTROLLED WITH YOUR NAUSEA MEDICATION  *UNUSUAL SHORTNESS OF BREATH  *UNUSUAL BRUISING OR BLEEDING  TENDERNESS IN MOUTH AND THROAT WITH OR WITHOUT PRESENCE OF ULCERS  *URINARY PROBLEMS  *BOWEL PROBLEMS  UNUSUAL RASH Items with * indicate a potential emergency and should be followed up as soon as possible.  Feel free to call the clinic should you have any questions or concerns. The clinic phone number is [phone].  Please show the CHEMO ALERT CARD at check-in to the Emergency Department and triage nurse.   

## 2018-04-26 NOTE — Progress Notes (Signed)
Pt presents today for [place]. VSS. MAR reviewed. Pt has no complaints of any changes since the last visit. Labs reviewed, UA obtained and sent to lab.  Negative for protein.   Labs reviewed with Dr. Walden Field. Proceed with treatment VO received. Pt given new appt schedule and instructed pt with his pump d/c to pick up prep for upcoming CT scan.   Treatment given today per MD orders. Tolerated infusion without adverse affects. Vital signs stable. No complaints at this time. Discharged from clinic ambulatory. 5FU pump infusing per protocol.  F/U with [facility] as scheduled.

## 2018-04-28 ENCOUNTER — Inpatient Hospital Stay (HOSPITAL_COMMUNITY): Payer: Medicare Other

## 2018-04-28 ENCOUNTER — Encounter (HOSPITAL_COMMUNITY): Payer: Self-pay

## 2018-04-28 VITALS — BP 113/63 | HR 71 | Temp 97.9°F | Resp 18

## 2018-04-28 DIAGNOSIS — C184 Malignant neoplasm of transverse colon: Secondary | ICD-10-CM

## 2018-04-28 DIAGNOSIS — Z5111 Encounter for antineoplastic chemotherapy: Secondary | ICD-10-CM | POA: Diagnosis not present

## 2018-04-28 DIAGNOSIS — Z95828 Presence of other vascular implants and grafts: Secondary | ICD-10-CM

## 2018-04-28 MED ORDER — SODIUM CHLORIDE 0.9% FLUSH
10.0000 mL | INTRAVENOUS | Status: DC | PRN
  Administered 2018-04-28: 10 mL
  Filled 2018-04-28: qty 10

## 2018-04-28 MED ORDER — HEPARIN SOD (PORK) LOCK FLUSH 100 UNIT/ML IV SOLN
500.0000 [IU] | Freq: Once | INTRAVENOUS | Status: AC | PRN
  Administered 2018-04-28: 500 [IU]

## 2018-04-28 NOTE — Progress Notes (Signed)
Kevin Kirk tolerated 5FU pump well without complaints or incident. 5FU pump discontinued with portacath flushed with 10 ml NS and 5 ml Heparin easily per protocol then de-accessed. VSS Pt discharged self ambulatory using his cane in satisfactory condition

## 2018-04-28 NOTE — Patient Instructions (Signed)
[facility] Discharge Instructions  5FU pump discontinued with portacath flushed per protocol. Follow-up as scheduled. Call clinic for any questions or concerns   Thank you for choosing [facility] at [facility] to provide your oncology and hematology care.  To afford each patient quality time with our provider, please arrive at least 15 minutes before your scheduled appointment time.   If you have a lab appointment with the Cancer Center please come in thru the  Main Entrance and check in at the main information desk  You need to re-schedule your appointment should you arrive 10 or more minutes late.  We strive to give you quality time with our providers, and arriving late affects you and other patients whose appointments are after yours.  Also, if you no show three or more times for appointments you may be dismissed from the clinic at the providers discretion.     Again, thank you for choosing [facility].  Our hope is that these requests will decrease the amount of time that you wait before being seen by our physicians.       _____________________________________________________________  Should you have questions after your visit to [facility], please contact our office at [phone] between the hours of 8:00 a.m. and 4:30 p.m.  Voicemails left after 4:00 p.m. will not be returned until the following business day.  For prescription refill requests, have your pharmacy contact our office and allow 72 hours.    Cancer Center Support Programs:   > Cancer Support Group  2nd Tuesday of the month 1pm-2pm, Journey Room   

## 2018-05-07 ENCOUNTER — Inpatient Hospital Stay (HOSPITAL_COMMUNITY): Payer: Medicare Other

## 2018-05-07 ENCOUNTER — Encounter (HOSPITAL_COMMUNITY): Payer: Self-pay

## 2018-05-07 ENCOUNTER — Ambulatory Visit (HOSPITAL_COMMUNITY)
Admission: RE | Admit: 2018-05-07 | Discharge: 2018-05-07 | Disposition: A | Discharge status: Home / Self Care | Payer: Medicare Other | Source: Ambulatory Visit | Attending: Internal Medicine | Admitting: Internal Medicine

## 2018-05-07 DIAGNOSIS — Z5111 Encounter for antineoplastic chemotherapy: Secondary | ICD-10-CM | POA: Diagnosis not present

## 2018-05-07 DIAGNOSIS — C184 Malignant neoplasm of transverse colon: Secondary | ICD-10-CM | POA: Diagnosis not present

## 2018-05-07 LAB — CBC WITH DIFFERENTIAL/PLATELET
Abs Immature Granulocytes: 0.03 10*3/uL (ref 0.00–0.07)
Basophils Absolute: 0 10*3/uL (ref 0.0–0.1)
Basophils Relative: 0 %
Eosinophils Absolute: 0.2 10*3/uL (ref 0.0–0.5)
Eosinophils Relative: 2 %
HCT: 39.9 % (ref 39.0–52.0)
Hemoglobin: 12.1 g/dL — ABNORMAL LOW (ref 13.0–17.0)
Immature Granulocytes: 0 %
Lymphocytes Relative: 39 %
Lymphs Abs: 3.6 10*3/uL (ref 0.7–4.0)
MCH: 32 pg (ref 26.0–34.0)
MCHC: 30.3 g/dL (ref 30.0–36.0)
MCV: 105.6 fL — ABNORMAL HIGH (ref 80.0–100.0)
Monocytes Absolute: 1.3 10*3/uL — ABNORMAL HIGH (ref 0.1–1.0)
Monocytes Relative: 14 %
Neutro Abs: 4.1 10*3/uL (ref 1.7–7.7)
Neutrophils Relative %: 45 %
Platelets: 178 10*3/uL (ref 150–400)
RBC: 3.78 MIL/uL — ABNORMAL LOW (ref 4.22–5.81)
RDW: 15.2 % (ref 11.5–15.5)
WBC: 9.2 10*3/uL (ref 4.0–10.5)
nRBC: 0.2 % (ref 0.0–0.2)

## 2018-05-07 LAB — LACTATE DEHYDROGENASE: LDH: 196 U/L — ABNORMAL HIGH (ref 98–192)

## 2018-05-07 LAB — COMPREHENSIVE METABOLIC PANEL
ALT: 18 U/L (ref 0–44)
AST: 24 U/L (ref 15–41)
Albumin: 4 g/dL (ref 3.5–5.0)
Alkaline Phosphatase: 39 U/L (ref 38–126)
Anion gap: 9 (ref 5–15)
BUN: 13 mg/dL (ref 8–23)
CO2: 26 mmol/L (ref 22–32)
Calcium: 9.5 mg/dL (ref 8.9–10.3)
Chloride: 103 mmol/L (ref 98–111)
Creatinine, Ser: 0.91 mg/dL (ref 0.61–1.24)
GFR calc Af Amer: >60 mL/min (ref >60)
GFR calc non Af Amer: >60 mL/min (ref >60)
Glucose, Bld: 135 mg/dL — ABNORMAL HIGH (ref 70–99)
Potassium: 5.2 mmol/L — ABNORMAL HIGH (ref 3.5–5.1)
Sodium: 138 mmol/L (ref 135–145)
Total Bilirubin: 0.7 mg/dL (ref 0.3–1.2)
Total Protein: 7.3 g/dL (ref 6.5–8.1)

## 2018-05-07 MED ORDER — IOHEXOL 300 MG/ML  SOLN
100.0000 mL | Freq: Once | INTRAMUSCULAR | Status: AC | PRN
  Administered 2018-05-07: 100 mL via INTRAVENOUS

## 2018-05-10 ENCOUNTER — Encounter (HOSPITAL_COMMUNITY): Payer: Self-pay | Admitting: Internal Medicine

## 2018-05-10 ENCOUNTER — Encounter (HOSPITAL_COMMUNITY): Payer: Self-pay

## 2018-05-10 ENCOUNTER — Inpatient Hospital Stay (HOSPITAL_BASED_OUTPATIENT_CLINIC_OR_DEPARTMENT_OTHER): Payer: Medicare Other | Admitting: Internal Medicine

## 2018-05-10 ENCOUNTER — Other Ambulatory Visit (HOSPITAL_COMMUNITY): Payer: Medicare Other

## 2018-05-10 ENCOUNTER — Inpatient Hospital Stay (HOSPITAL_COMMUNITY): Payer: Medicare Other

## 2018-05-10 VITALS — BP 137/74 | HR 62 | Temp 97.5°F | Resp 18

## 2018-05-10 DIAGNOSIS — C184 Malignant neoplasm of transverse colon: Secondary | ICD-10-CM | POA: Diagnosis not present

## 2018-05-10 DIAGNOSIS — D7589 Other specified diseases of blood and blood-forming organs: Secondary | ICD-10-CM

## 2018-05-10 DIAGNOSIS — I8001 Phlebitis and thrombophlebitis of superficial vessels of right lower extremity: Secondary | ICD-10-CM | POA: Diagnosis not present

## 2018-05-10 DIAGNOSIS — G629 Polyneuropathy, unspecified: Secondary | ICD-10-CM

## 2018-05-10 DIAGNOSIS — Z5111 Encounter for antineoplastic chemotherapy: Secondary | ICD-10-CM | POA: Diagnosis not present

## 2018-05-10 DIAGNOSIS — Z95828 Presence of other vascular implants and grafts: Secondary | ICD-10-CM

## 2018-05-10 DIAGNOSIS — C786 Secondary malignant neoplasm of retroperitoneum and peritoneum: Secondary | ICD-10-CM | POA: Diagnosis not present

## 2018-05-10 DIAGNOSIS — Z79899 Other long term (current) drug therapy: Secondary | ICD-10-CM

## 2018-05-10 MED ORDER — GABAPENTIN 600 MG PO TABS: 600.0000 mg | ORAL_TABLET | Freq: Two times a day (BID) | ORAL | 3 refills | Status: DC

## 2018-05-10 MED ORDER — SODIUM CHLORIDE 0.9 % IV SOLN
400.0000 mg | Freq: Once | INTRAVENOUS | Status: AC
  Administered 2018-05-10: 400 mg via INTRAVENOUS
  Filled 2018-05-10: qty 16

## 2018-05-10 MED ORDER — LEUCOVORIN CALCIUM INJECTION 350 MG
700.0000 mg | Freq: Once | INTRAVENOUS | Status: AC
  Administered 2018-05-10: 700 mg via INTRAVENOUS
  Filled 2018-05-10: qty 35

## 2018-05-10 MED ORDER — PALONOSETRON HCL INJECTION 0.25 MG/5ML
0.2500 mg | Freq: Once | INTRAVENOUS | Status: AC
  Administered 2018-05-10: 0.25 mg via INTRAVENOUS
  Filled 2018-05-10: qty 5

## 2018-05-10 MED ORDER — SODIUM CHLORIDE 0.9 % IV SOLN
10.0000 mg | Freq: Once | INTRAVENOUS | Status: AC
  Administered 2018-05-10: 10 mg via INTRAVENOUS
  Filled 2018-05-10: qty 10

## 2018-05-10 MED ORDER — SODIUM CHLORIDE 0.9 % IV SOLN
Freq: Once | INTRAVENOUS | Status: AC
  Administered 2018-05-10: 12:00:00 via INTRAVENOUS

## 2018-05-10 MED ORDER — FLUOROURACIL CHEMO INJECTION 2.5 GM/50ML
320.0000 mg/m2 | Freq: Once | INTRAVENOUS | Status: AC
  Administered 2018-05-10: 650 mg via INTRAVENOUS
  Filled 2018-05-10: qty 13

## 2018-05-10 MED ORDER — ATROPINE SULFATE 1 MG/ML IJ SOLN
0.5000 mg | Freq: Once | INTRAMUSCULAR | Status: AC
  Administered 2018-05-10: 0.5 mg via INTRAVENOUS
  Filled 2018-05-10: qty 1

## 2018-05-10 MED ORDER — IRINOTECAN HCL CHEMO INJECTION 100 MG/5ML
144.0000 mg/m2 | Freq: Once | INTRAVENOUS | Status: AC
  Administered 2018-05-10: 300 mg via INTRAVENOUS
  Filled 2018-05-10: qty 15

## 2018-05-10 MED ORDER — SODIUM CHLORIDE 0.9 % IV SOLN
1920.0000 mg/m2 | INTRAVENOUS | Status: DC
  Administered 2018-05-10: 3950 mg via INTRAVENOUS
  Filled 2018-05-10: qty 79

## 2018-05-10 MED ORDER — SODIUM CHLORIDE 0.9 % IV SOLN: 10.0000 mg | Freq: Once | INTRAVENOUS | Status: DC

## 2018-05-10 MED ORDER — SODIUM CHLORIDE 0.9 % IV SOLN
Freq: Once | INTRAVENOUS | Status: AC
  Administered 2018-05-10: 11:00:00 via INTRAVENOUS

## 2018-05-10 NOTE — Patient Instructions (Signed)
[facility] Discharge Instructions for Patients Receiving Chemotherapy  Today you received the following chemotherapy agents   To help prevent nausea and vomiting after your treatment, we encourage you to take your nausea medication   If you develop nausea and vomiting that is not controlled by your nausea medication, call the clinic.   BELOW ARE SYMPTOMS THAT SHOULD BE REPORTED IMMEDIATELY:  *FEVER GREATER THAN 100.5 F  *CHILLS WITH OR WITHOUT FEVER  NAUSEA AND VOMITING THAT IS NOT CONTROLLED WITH YOUR NAUSEA MEDICATION  *UNUSUAL SHORTNESS OF BREATH  *UNUSUAL BRUISING OR BLEEDING  TENDERNESS IN MOUTH AND THROAT WITH OR WITHOUT PRESENCE OF ULCERS  *URINARY PROBLEMS  *BOWEL PROBLEMS  UNUSUAL RASH Items with * indicate a potential emergency and should be followed up as soon as possible.  Feel free to call the clinic should you have any questions or concerns. The clinic phone number is [phone].  Please show the CHEMO ALERT CARD at check-in to the Emergency Department and triage nurse.   

## 2018-05-10 NOTE — Progress Notes (Signed)
Pt seen today by Dr. Walden Field. Proceed with treatment. Labs done on 05/07/2018. Reviewed with MD. No urine needed today per Dr. Walden Field.  VSS.   Treatment given today per MD orders. Tolerated infusion without adverse affects. Vital signs stable. No complaints at this time. 38fu pump connected and infusing per protocol. Discharged from clinic ambulatory. F/U with [facility] as scheduled.

## 2018-05-10 NOTE — Progress Notes (Signed)
Diagnosis Neuropathy - Plan: gabapentin (NEURONTIN) 600 MG tablet  Staging Cancer Staging Malignant neoplasm of transverse colon [facility]) Staging form: Colon and Rectum, AJCC 8th Edition - Clinical: cT3, cN1a, cM1 - Signed by Twana First, MD on 03/09/2017 - Pathologic: No stage assigned - Unsigned   Assessment and Plan:   1.  Stage IV colon cancer (pT3 pN1 M1) MSI -High: -K-ras mutation negative, BRAF V600E positive, PIK3CA, CDKN2A,TP53  [name] treated with immunotherapy with nivolumab for diagnosis of colon cancer under the direction of Dr Lebron Conners.  He had been on Nivo since 02/23/2017.    Due to progression on .PET scan done 07/20/2017,Pt  was presented options of therapy with Folfiri and Avastin.   Pt will be treated with 43f 400 mg/m2 with 5 fu CIVI 2400 mg/m2 over 46 hours, Leucovorin 400 mg/m2, Irinotecan 180 mg/m2 with Avastin 5 mg/kg  Every 2 weeks.  Side effects of the medication were reviewed with pt including primarily bleeding and diarrhea.   CT CAP done 10/12/2017 showed   IMPRESSION: 1. Stable scattered mediastinal lymph nodes. No new or progressive findings. 2. No findings for pulmonary metastatic disease. 3. No hepatic metastatic disease. 4. The left-sided omental nodule has decreased in size since the prior PET-CT. 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 5. Small scattered retroperitoneal and pelvic lymph nodes. No new or progressive findings. 6. Prostate gland enlargement, stable. 7. Cholelithiasis.  CT CAP done 02/04/2018 reviewed and showed  IMPRESSION: 1. Mild interval increase in size of a few of the mediastinal lymph nodes. Additional mediastinal hilar nodes are stable.  Node went from 0.9 cm to 1 cm.   2. New nodular area of consolidation within the right upper lobe which may be infectious/inflammatory in etiology. There is an adjacent new right upper lobe nodule. Recommend attention  on follow-up. 3. Similar-appearing left upper quadrant omental nodule. Similar-appearing nodule within the right hemipelvis. 4. Multiple stones within the gallbladder lumen. Mild gallbladder wall thickening with surrounding fat stranding. Cholecystitis is not excluded. Recommend clinical and laboratory correlation.  Overall, picture is stable.   Pet scan done 05/07/2018 reviewed and showed  IMPRESSION: 1. Similar right pelvic nodal and left abdominal omental metastasis. 2. No new or progressive disease. 3. Similar mild thoracic adenopathy, indeterminate. 4. Primarily similar nonspecific bilateral pulmonary nodules. A 4 mm right lower lobe nodule is not readily apparent on the prior. 5.  Aortic Atherosclerosis (ICD10-I70.0). 6. Cholelithiasis. 7. 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 8. Indeterminate left renal lesion. Given hyperattenuation on the noncontrast CT from prior PET, favor a complex cyst. 9. Ascending aortic aneurysm, similar at 4.5 cm.  Labs done 05/07/2018 reviewed and showed WBC 9.2 HB 12.1 plts 178,000.  Chemistries WNl with K+ 5.2 Cr 0.91 and normal LFTs.  UA negative for protein.  Pt will continue therapy as directed and he will have repeat imaging in 08/2018.  Pt will be seen for follow-up in 1 month.    2.  Nail changes.  Pt advised to use cuticle oil on nails and keep moisturized.  He is advised to avoid picking site or trauma to nail.  He should notify the office if any worsening of symptoms prior to his next visit.     3.  Diarrhea.  He reports improvement in symptoms.  Pt advised to use antidiarrheal medications as recommended.   4.  Macrocytosis.  B12 elevated.  MMA levels normal.  HB WNL at 12.   Will continue to monitor counts as therapy proceeds.    5.  Superficial thrombophlebitis.  RLE doppler done 11/02/2017 reviewed and showed IMPRESSION: 1.  No evidence of right lower extremity deep vein thrombosis. 2. Superficial  thrombophlebitis of the right great saphenous vein in the calf.  Aleve prn pain.    6.  IDA.  Patient has been treated with IV iron in the past. HB 11.4  Ferritin 185 in 02/2018.  HB 12.  Will continue to follow-up counts.    7.  Hypertension.  BP is 142/75.  Follow-up with PCP.    8.  Family history of GI cancers.  He reports this occurred in his brother.  Pt has  MSI High, BRAF + tumor.    9.  Neuropathy.  Pt reports improvement with Neurontin.  RX for Neurontin # 180 with refills sent to pharmacy.    25 minutes spent with more than 50% spent in counseling and coordination of care.    Interval History:   85 y.o. Treated with nivolumab for diagnosis of recurrent colon cancer under the direction of Dr Lebron Conners.   Pt now on Folfiri and Avastin.    Current Status:  Pt is seen today for follow-up prior to C21  of Folfiri and Avastin.  He reports diarrhea has resolved.  He is here to go over labs and scans.      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[place])   07/11/2016 Initial Diagnosis    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[place])    07/27/2017 -  Chemotherapy    The patient had palonosetron (ALOXI) injection 0.25 mg, 0.25 mg, Intravenous,  Once, 21 of 24 cycles Administration: 0.25 mg (07/27/2017), 0.25 mg (08/10/2017), 0.25 mg (08/24/2017), 0.25 mg (09/07/2017), 0.25 mg (09/21/2017), 0.25 mg (10/05/2017), 0.25 mg (10/19/2017), 0.25 mg (11/02/2017), 0.25 mg (11/16/2017), 0.25 mg (11/30/2017), 0.25 mg (12/14/2017), 0.25 mg (12/28/2017), 0.25 mg (01/12/2018), 0.25 mg (01/26/2018), 0.25 mg (02/09/2018), 0.25 mg (02/24/2018), 0.25 mg (03/10/2018), 0.25 mg (03/29/2018), 0.25 mg (04/12/2018), 0.25 mg (04/26/2018) pegfilgrastim-cbqv (UDENYCA) injection 6 mg, 6 mg, Subcutaneous, Once, 7 of 7 cycles Administration: 6 mg (10/07/2017), 6 mg (10/21/2017), 6 mg (11/04/2017), 6 mg (11/18/2017), 6 mg (12/02/2017), 6 mg (12/16/2017), 6 mg (12/30/2017) bevacizumab (AVASTIN) 450 mg in sodium chloride 0.9 % 100 mL chemo infusion, 5 mg/kg =  450 mg, Intravenous,  Once, 21 of 24 cycles Dose modification: 4 mg/kg (80 % of original dose 5 mg/kg, Cycle 5, Reason: Provider Judgment), 5 mg/kg (original dose 5 mg/kg, Cycle 16, Reason: Other (see comments)) Administration: 450 mg (07/27/2017), 450 mg (08/10/2017), 450 mg (08/24/2017), 450 mg (09/07/2017), 450 mg (09/21/2017), 450 mg (10/05/2017), 450 mg (10/19/2017), 450 mg (11/02/2017), 450 mg (11/16/2017), 450 mg (11/30/2017), 450 mg (12/14/2017), 450 mg (12/28/2017), 400 mg (01/12/2018), 400 mg (01/26/2018), 400 mg (02/09/2018), 400 mg (02/24/2018), 400 mg (03/10/2018), 400 mg (03/29/2018), 400 mg (04/12/2018), 400 mg (04/26/2018) irinotecan (CAMPTOSAR) 400 mg in dextrose 5 % 500 mL chemo infusion, 180 mg/m2 = 400 mg, Intravenous,  Once, 21 of 24 cycles Dose modification: 144 mg/m2 (80 % of original dose 180 mg/m2, Cycle 5, Reason: Provider Judgment) Administration: 400 mg (07/27/2017), 400 mg (08/10/2017), 400 mg (08/24/2017), 400 mg (09/07/2017), 320 mg (09/21/2017), 320 mg (10/05/2017), 320 mg (10/19/2017), 320 mg (11/02/2017), 320 mg (11/16/2017), 320 mg (11/30/2017), 320 mg (12/14/2017), 320 mg (12/28/2017), 320 mg (01/12/2018), 320 mg (01/26/2018), 320 mg (02/09/2018), 300 mg (02/24/2018), 300 mg (03/10/2018), 300 mg (03/29/2018), 300 mg (  04/12/2018), 300 mg (04/26/2018) leucovorin 800 mg in dextrose 5 % 250 mL infusion, 872 mg, Intravenous,  Once, 21 of 24 cycles Dose modification: 320 mg/m2 (80 % of original dose 400 mg/m2, Cycle 5, Reason: Provider Judgment) Administration: 800 mg (07/27/2017), 800 mg (08/10/2017), 800 mg (08/24/2017), 800 mg (09/07/2017), 700 mg (09/21/2017), 700 mg (10/05/2017), 700 mg (10/19/2017), 700 mg (11/02/2017), 700 mg (11/16/2017), 700 mg (11/30/2017), 700 mg (12/14/2017), 700 mg (12/28/2017), 700 mg (01/12/2018), 700 mg (01/26/2018), 700 mg (02/09/2018), 700 mg (02/24/2018), 700 mg (03/10/2018), 700 mg (03/29/2018), 700 mg (04/12/2018), 700 mg (04/26/2018) fluorouracil (ADRUCIL) chemo injection 850 mg, 400 mg/m2 = 850 mg,  Intravenous,  Once, 21 of 24 cycles Dose modification: 320 mg/m2 (80 % of original dose 400 mg/m2, Cycle 5, Reason: Provider Judgment) Administration: 850 mg (07/27/2017), 850 mg (08/10/2017), 850 mg (08/24/2017), 850 mg (09/07/2017), 700 mg (09/21/2017), 700 mg (10/05/2017), 700 mg (10/19/2017), 700 mg (11/02/2017), 700 mg (11/16/2017), 700 mg (11/30/2017), 700 mg (12/14/2017), 700 mg (12/28/2017), 700 mg (01/12/2018), 700 mg (01/26/2018), 700 mg (02/09/2018), 650 mg (02/24/2018), 650 mg (03/10/2018), 650 mg (03/29/2018), 650 mg (04/12/2018), 650 mg (04/26/2018) fluorouracil (ADRUCIL) 5,250 mg in sodium chloride 0.9 % 145 mL chemo infusion, 2,400 mg/m2 = 5,250 mg, Intravenous, 1 Day/Dose, 21 of 24 cycles Dose modification: 1,920 mg/m2 (80 % of original dose 2,400 mg/m2, Cycle 5, Reason: Provider Judgment) Administration: 5,250 mg (07/27/2017), 5,250 mg (08/10/2017), 5,250 mg (08/24/2017), 5,250 mg (09/07/2017), 4,200 mg (09/21/2017), 4,200 mg (10/05/2017), 4,200 mg (10/19/2017), 4,200 mg (11/02/2017), 4,200 mg (11/16/2017), 4,200 mg (11/30/2017), 4,200 mg (12/14/2017), 4,200 mg (12/28/2017), 4,200 mg (01/12/2018), 4,200 mg (01/26/2018), 4,200 mg (02/09/2018), 3,950 mg (02/24/2018), 3,950 mg (03/10/2018), 3,950 mg (03/29/2018), 3,950 mg (04/12/2018), 3,950 mg (04/26/2018)  for chemotherapy treatment.       Problem List Patient Active Problem List   Diagnosis Date Noted  . Port-A-Cath in place [facility] 05/18/2017  . Iron deficiency anemia [D50.9] 09/07/2016  . S/P partial colectomy [Z90.49] 07/11/2016  . 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[place]) [C18.4]   . High cholesterol [E78.00] 04/22/2016  . H/O aortic valve replacement [Z95.2] 04/22/2016  . Diabetes ([place]) [E11.9] 04/22/2016  . Chronic pulmonary embolism ([place]) [I27.82] 04/22/2016  . Rectal bleeding [K62.5] 04/22/2016    Past Medical History Past Medical History:  Diagnosis Date  . Chronic pulmonary embolism ([place]) 77/4128   compliction of the valve replacement surgery  .  COPD (chronic obstructive pulmonary disease) ([place])   . Diabetes ([place]) 04/22/2016  . H/O aortic valve replacement 10/2008  . Hypercholesteremia   . Hypertension   . Pulmonary emboli ([place]) 10/2008  . Sleep apnea     Past Surgical History Past Surgical History:  Procedure Laterality Date  . AORTIC VALVE REPLACEMENT     2010  . CARDIAC CATHETERIZATION  2010  . COLONOSCOPY N/A 06/05/2016   Procedure: COLONOSCOPY;  Surgeon: Rogene Houston, MD;  Location: [facility] ENDO SUITE;  Service: Endoscopy;  Laterality: N/A;  . PARTIAL COLECTOMY N/A 07/11/2016   Procedure: PARTIAL COLECTOMY;  Surgeon: Aviva Signs, MD;  Location: [facility] ORS;  Service: General;  Laterality: N/A;  . PORTACATH PLACEMENT N/A 08/13/2016   Procedure: INSERTION PORT-A-CATH LEFT SUBCLAVIAN;  Surgeon: Aviva Signs, MD;  Location: [facility] ORS;  Service: General;  Laterality: N/A;  . REPLACEMENT TOTAL KNEE     1996  . spenectomy     from trauma    Family History Family History  Problem Relation Age of Onset  . Colon cancer Neg Hx  Social History  reports that he quit smoking about 56 years ago. His smoking use included cigarettes. He quit after 15.00 years of use. He has never used smokeless tobacco. He reports that he does not drink alcohol or use drugs.  Medications  Current Outpatient Medications:  .  atorvastatin (LIPITOR) 10 MG tablet, Take 10 mg by mouth at bedtime. , Disp: , Rfl:  .  Cholecalciferol (VITAMIN D3) 5000 units CAPS, Take 5,000 Units by mouth daily. , Disp: , Rfl:  .  diphenoxylate-atropine (LOMOTIL) 2.5-0.025 MG tablet, Take 1 tablet by mouth 4 (four) times daily as needed for diarrhea or loose stools., Disp: 60 tablet, Rfl: 3 .  gabapentin (NEURONTIN) 600 MG tablet, Take 1 tablet (600 mg total) by mouth 2 (two) times daily., Disp: 180 tablet, Rfl: 3 .  metoprolol (LOPRESSOR) 50 MG tablet, Take 50 mg by mouth 2 (two) times daily., Disp: , Rfl:  .  Multiple Vitamins-Minerals (CENTRUM SILVER 50+MEN PO), Take 1  tablet by mouth daily., Disp: , Rfl:  .  RELION GLUCOSE TEST STRIPS test strip, USE 1 STRIP TO CHECK GLUCOSE ONCE DAILY, Disp: , Rfl:  .  vitamin B-12 (CYANOCOBALAMIN) 1000 MCG tablet, Take 1,000 mcg by mouth daily. , Disp: , Rfl:  No current facility-administered medications for this visit.   Facility-Administered Medications Ordered in Other Visits:  .  0.9 %  sodium chloride infusion, , Intravenous, Once, Reginia Battie, Mathis Dad, MD .  bevacizumab (AVASTIN) 400 mg in sodium chloride 0.9 % 100 mL chemo infusion, 400 mg, Intravenous, Once, Jarek Longton, Mathis Dad, MD .  fluorouracil (ADRUCIL) 3,950 mg in sodium chloride 0.9 % 71 mL chemo infusion, 1,920 mg/m2 (Treatment Plan Recorded), Intravenous, 1 day or 1 dose, Jalene Demo, MD .  fluorouracil (ADRUCIL) chemo injection 650 mg, 320 mg/m2 (Treatment Plan Recorded), Intravenous, Once, Donyae Kilner, MD .  irinotecan (CAMPTOSAR) 300 mg in dextrose 5 % 500 mL chemo infusion, 144 mg/m2 (Treatment Plan Recorded), Intravenous, Once, Daylee Delahoz, MD .  leucovorin 700 mg in dextrose 5 % 250 mL infusion, 700 mg, Intravenous, Once, Dorothymae Maciver, Mathis Dad, MD  Allergies Amoxicillin and Tamsulosin hcl  Review of Systems Review of Systems - Oncology ROS negative other than nail changes   Physical Exam  Vitals Wt Readings from Last 3 Encounters:  05/10/18 182 lb 4.8 oz (82.7 kg)  04/26/18 181 lb 14.1 oz (82.5 kg)  04/12/18 186 lb 9.6 oz (84.6 kg)   Temp Readings from Last 3 Encounters:  05/10/18 (!) 97.5 F (36.4 C) (Oral)  04/28/18 97.9 F (36.6 C) (Oral)  04/26/18 97.6 F (36.4 C) (Oral)   BP Readings from Last 3 Encounters:  05/10/18 (!) 142/75  04/28/18 113/63  04/26/18 (!) 144/67   Pulse Readings from Last 3 Encounters:  05/10/18 68  04/28/18 71  04/26/18 60   Constitutional: Well-developed, well-nourished, and in no distress.   HENT: Head: Normocephalic and atraumatic.  Mouth/Throat: No oropharyngeal exudate. Mucosa moist. Eyes: Pupils are equal, round,  and reactive to light. Conjunctivae are normal. No scleral icterus.  Neck: Normal range of motion. Neck supple. No JVD present.  Cardiovascular: Normal rate, regular rhythm and normal heart sounds.  Exam reveals no gallop and no friction rub.   No murmur heard. Pulmonary/Chest: Effort normal and breath sounds normal. No respiratory distress. No wheezes.No rales.  Abdominal: Soft. Bowel sounds are normal. No distension. There is no tenderness. There is no guarding.  Musculoskeletal: No edema or tenderness.  Lymphadenopathy: No cervical, axillary or supraclavicular adenopathy.  Neurological: Alert and oriented to person, place, and time. No cranial nerve deficit.  Skin: Skin is warm and dry. No rash noted. No erythema. No pallor. Cracking noted on thumb nail.   Psychiatric: Affect and judgment normal.   Labs No visits with results within 3 Day(s) from this visit.  Latest known visit with results is:  Appointment on 05/07/2018  Component Date Value Ref Range Status  . WBC 05/07/2018 9.2  4.0 - 10.5 K/uL Final  . RBC 05/07/2018 3.78* 4.22 - 5.81 MIL/uL Final  . Hemoglobin 05/07/2018 12.1* 13.0 - 17.0 g/dL Final  . HCT 05/07/2018 39.9  39.0 - 52.0 % Final  . MCV 05/07/2018 105.6* 80.0 - 100.0 fL Final  . MCH 05/07/2018 32.0  26.0 - 34.0 pg Final  . MCHC 05/07/2018 30.3  30.0 - 36.0 g/dL Final  . RDW 05/07/2018 15.2  11.5 - 15.5 % Final  . Platelets 05/07/2018 178  150 - 400 K/uL Final  . nRBC 05/07/2018 0.2  0.0 - 0.2 % Final  . Neutrophils Relative % 05/07/2018 45  % Final  . Neutro Abs 05/07/2018 4.1  1.7 - 7.7 K/uL Final  . Lymphocytes Relative 05/07/2018 39  % Final  . Lymphs Abs 05/07/2018 3.6  0.7 - 4.0 K/uL Final  . Monocytes Relative 05/07/2018 14  % Final  . Monocytes Absolute 05/07/2018 1.3* 0.1 - 1.0 K/uL Final  . Eosinophils Relative 05/07/2018 2  % Final  . Eosinophils Absolute 05/07/2018 0.2  0.0 - 0.5 K/uL Final  . Basophils Relative 05/07/2018 0  % Final  . Basophils  Absolute 05/07/2018 0.0  0.0 - 0.1 K/uL Final  . Immature Granulocytes 05/07/2018 0  % Final  . Abs Immature Granulocytes 05/07/2018 0.03  0.00 - 0.07 K/uL Final   Performed at [facility], [address] [place], [place] [address]  . Sodium 05/07/2018 138  135 - 145 mmol/L Final  . Potassium 05/07/2018 5.2* 3.5 - 5.1 mmol/L Final  . Chloride 05/07/2018 103  98 - 111 mmol/L Final  . CO2 05/07/2018 26  22 - 32 mmol/L Final  . Glucose, Bld 05/07/2018 135* 70 - 99 mg/dL Final  . BUN 05/07/2018 13  8 - 23 mg/dL Final  . Creatinine, Ser 05/07/2018 0.91  0.61 - 1.24 mg/dL Final  . Calcium 05/07/2018 9.5  8.9 - 10.3 mg/dL Final  . Total Protein 05/07/2018 7.3  6.5 - 8.1 g/dL Final  . Albumin 05/07/2018 4.0  3.5 - 5.0 g/dL Final  . AST 05/07/2018 24  15 - 41 U/L Final  . ALT 05/07/2018 18  0 - 44 U/L Final  . Alkaline Phosphatase 05/07/2018 39  38 - 126 U/L Final  . Total Bilirubin 05/07/2018 0.7  0.3 - 1.2 mg/dL Final  . GFR calc non Af Amer 05/07/2018 >60  >60 mL/min Final  . GFR calc Af Amer 05/07/2018 >60  >60 mL/min Final  . Anion gap 05/07/2018 9  5 - 15 Final   Performed at [facility], [address] [place], [place] [address]  . LDH 05/07/2018 196* 98 - 192 U/L Final   Performed at [facility], [address], [place], [place] [address]     Pathology No orders of the defined types were placed in this encounter.      Zoila Shutter MD

## 2018-05-12 ENCOUNTER — Inpatient Hospital Stay (HOSPITAL_COMMUNITY): Payer: Medicare Other

## 2018-05-12 ENCOUNTER — Encounter (HOSPITAL_COMMUNITY): Payer: Self-pay

## 2018-05-12 VITALS — BP 154/79 | HR 63 | Temp 97.8°F | Resp 18

## 2018-05-12 DIAGNOSIS — Z95828 Presence of other vascular implants and grafts: Secondary | ICD-10-CM

## 2018-05-12 DIAGNOSIS — Z5111 Encounter for antineoplastic chemotherapy: Secondary | ICD-10-CM | POA: Diagnosis not present

## 2018-05-12 DIAGNOSIS — C184 Malignant neoplasm of transverse colon: Secondary | ICD-10-CM

## 2018-05-12 MED ORDER — HEPARIN SOD (PORK) LOCK FLUSH 100 UNIT/ML IV SOLN
500.0000 [IU] | Freq: Once | INTRAVENOUS | Status: AC | PRN
  Administered 2018-05-12: 500 [IU]
  Filled 2018-05-12: qty 5

## 2018-05-12 MED ORDER — SODIUM CHLORIDE 0.9% FLUSH
10.0000 mL | INTRAVENOUS | Status: DC | PRN
  Administered 2018-05-12: 10 mL
  Filled 2018-05-12: qty 10

## 2018-05-12 NOTE — Patient Instructions (Signed)
[facility] Discharge Instructions  5FU pump discontinued with portacath flushed per protocol. Follow-up as scheduled. Call clinic for any questions or concerns   Thank you for choosing [facility] at [facility] to provide your oncology and hematology care.  To afford each patient quality time with our provider, please arrive at least 15 minutes before your scheduled appointment time.   If you have a lab appointment with the Cancer Center please come in thru the  Main Entrance and check in at the main information desk  You need to re-schedule your appointment should you arrive 10 or more minutes late.  We strive to give you quality time with our providers, and arriving late affects you and other patients whose appointments are after yours.  Also, if you no show three or more times for appointments you may be dismissed from the clinic at the providers discretion.     Again, thank you for choosing [facility].  Our hope is that these requests will decrease the amount of time that you wait before being seen by our physicians.       _____________________________________________________________  Should you have questions after your visit to [facility], please contact our office at [phone] between the hours of 8:00 a.m. and 4:30 p.m.  Voicemails left after 4:00 p.m. will not be returned until the following business day.  For prescription refill requests, have your pharmacy contact our office and allow 72 hours.    Cancer Center Support Programs:   > Cancer Support Group  2nd Tuesday of the month 1pm-2pm, Journey Room   

## 2018-05-12 NOTE — Progress Notes (Signed)
Majed H Duley tolerated 5FU pump well without complaints or incident. 5FU pump discontinued with portacath flushed per protocol then de-accessed. VSS Pt discharged self ambulatory using his cane in satisfactory condition

## 2018-05-20 ENCOUNTER — Ambulatory Visit (INDEPENDENT_AMBULATORY_CARE_PROVIDER_SITE_OTHER): Payer: Medicare Other | Admitting: Otolaryngology

## 2018-05-20 DIAGNOSIS — H6123 Impacted cerumen, bilateral: Secondary | ICD-10-CM

## 2018-05-20 DIAGNOSIS — H903 Sensorineural hearing loss, bilateral: Secondary | ICD-10-CM

## 2018-05-21 ENCOUNTER — Other Ambulatory Visit (HOSPITAL_COMMUNITY): Payer: Medicare Other

## 2018-05-24 ENCOUNTER — Other Ambulatory Visit: Payer: Self-pay

## 2018-05-24 ENCOUNTER — Other Ambulatory Visit (HOSPITAL_COMMUNITY): Payer: Self-pay | Admitting: Internal Medicine

## 2018-05-24 ENCOUNTER — Inpatient Hospital Stay (HOSPITAL_COMMUNITY): Payer: Medicare Other

## 2018-05-24 ENCOUNTER — Inpatient Hospital Stay (HOSPITAL_COMMUNITY): Payer: Medicare Other | Attending: Internal Medicine

## 2018-05-24 VITALS — BP 131/63 | HR 58 | Temp 97.3°F | Resp 18 | Wt 182.2 lb

## 2018-05-24 DIAGNOSIS — G629 Polyneuropathy, unspecified: Secondary | ICD-10-CM | POA: Diagnosis not present

## 2018-05-24 DIAGNOSIS — Z79899 Other long term (current) drug therapy: Secondary | ICD-10-CM | POA: Diagnosis not present

## 2018-05-24 DIAGNOSIS — C184 Malignant neoplasm of transverse colon: Secondary | ICD-10-CM

## 2018-05-24 DIAGNOSIS — Z5111 Encounter for antineoplastic chemotherapy: Secondary | ICD-10-CM | POA: Insufficient documentation

## 2018-05-24 DIAGNOSIS — Z5112 Encounter for antineoplastic immunotherapy: Secondary | ICD-10-CM | POA: Insufficient documentation

## 2018-05-24 DIAGNOSIS — E119 Type 2 diabetes mellitus without complications: Secondary | ICD-10-CM | POA: Diagnosis not present

## 2018-05-24 DIAGNOSIS — C189 Malignant neoplasm of colon, unspecified: Secondary | ICD-10-CM

## 2018-05-24 DIAGNOSIS — Z95828 Presence of other vascular implants and grafts: Secondary | ICD-10-CM

## 2018-05-24 DIAGNOSIS — Z87891 Personal history of nicotine dependence: Secondary | ICD-10-CM | POA: Diagnosis not present

## 2018-05-24 DIAGNOSIS — I10 Essential (primary) hypertension: Secondary | ICD-10-CM | POA: Diagnosis not present

## 2018-05-24 LAB — COMPREHENSIVE METABOLIC PANEL
ALT: 17 U/L (ref 0–44)
AST: 24 U/L (ref 15–41)
Albumin: 3.8 g/dL (ref 3.5–5.0)
Alkaline Phosphatase: 39 U/L (ref 38–126)
Anion gap: 8 (ref 5–15)
BUN: 15 mg/dL (ref 8–23)
CO2: 26 mmol/L (ref 22–32)
Calcium: 9 mg/dL (ref 8.9–10.3)
Chloride: 106 mmol/L (ref 98–111)
Creatinine, Ser: 0.99 mg/dL (ref 0.61–1.24)
GFR calc Af Amer: >60 mL/min (ref >60)
GFR calc non Af Amer: >60 mL/min (ref >60)
Glucose, Bld: 135 mg/dL — ABNORMAL HIGH (ref 70–99)
Potassium: 4.8 mmol/L (ref 3.5–5.1)
Sodium: 140 mmol/L (ref 135–145)
Total Bilirubin: 0.8 mg/dL (ref 0.3–1.2)
Total Protein: 6.6 g/dL (ref 6.5–8.1)

## 2018-05-24 LAB — CBC WITH DIFFERENTIAL/PLATELET
Abs Immature Granulocytes: 0.02 10*3/uL (ref 0.00–0.07)
Basophils Absolute: 0 10*3/uL (ref 0.0–0.1)
Basophils Relative: 0 %
Eosinophils Absolute: 0.2 10*3/uL (ref 0.0–0.5)
Eosinophils Relative: 2 %
HCT: 36.5 % — ABNORMAL LOW (ref 39.0–52.0)
Hemoglobin: 11.3 g/dL — ABNORMAL LOW (ref 13.0–17.0)
Immature Granulocytes: 0 %
Lymphocytes Relative: 30 %
Lymphs Abs: 2.4 10*3/uL (ref 0.7–4.0)
MCH: 32.9 pg (ref 26.0–34.0)
MCHC: 31 g/dL (ref 30.0–36.0)
MCV: 106.4 fL — ABNORMAL HIGH (ref 80.0–100.0)
Monocytes Absolute: 1.2 10*3/uL — ABNORMAL HIGH (ref 0.1–1.0)
Monocytes Relative: 15 %
Neutro Abs: 4.1 10*3/uL (ref 1.7–7.7)
Neutrophils Relative %: 53 %
Platelets: 189 10*3/uL (ref 150–400)
RBC: 3.43 MIL/uL — ABNORMAL LOW (ref 4.22–5.81)
RDW: 15.3 % (ref 11.5–15.5)
WBC: 7.9 10*3/uL (ref 4.0–10.5)
nRBC: 0 % (ref 0.0–0.2)

## 2018-05-24 LAB — URINALYSIS, DIPSTICK ONLY
Bilirubin Urine: NEGATIVE
Glucose, UA: NEGATIVE mg/dL
Hgb urine dipstick: NEGATIVE
Ketones, ur: NEGATIVE mg/dL
Leukocytes,Ua: NEGATIVE
Nitrite: NEGATIVE
Protein, ur: NEGATIVE mg/dL
Specific Gravity, Urine: 1.018 (ref 1.005–1.030)
pH: 5 (ref 5.0–8.0)

## 2018-05-24 MED ORDER — FLUOROURACIL CHEMO INJECTION 2.5 GM/50ML
320.0000 mg/m2 | Freq: Once | INTRAVENOUS | Status: AC
  Administered 2018-05-24: 650 mg via INTRAVENOUS
  Filled 2018-05-24: qty 13

## 2018-05-24 MED ORDER — PALONOSETRON HCL INJECTION 0.25 MG/5ML
0.2500 mg | Freq: Once | INTRAVENOUS | Status: AC
  Administered 2018-05-24: 0.25 mg via INTRAVENOUS
  Filled 2018-05-24: qty 5

## 2018-05-24 MED ORDER — SODIUM CHLORIDE 0.9 % IV SOLN
1920.0000 mg/m2 | INTRAVENOUS | Status: AC
  Administered 2018-05-24: 3950 mg via INTRAVENOUS
  Filled 2018-05-24: qty 79

## 2018-05-24 MED ORDER — IRINOTECAN HCL CHEMO INJECTION 100 MG/5ML
144.0000 mg/m2 | Freq: Once | INTRAVENOUS | Status: AC
  Administered 2018-05-24: 300 mg via INTRAVENOUS
  Filled 2018-05-24: qty 15

## 2018-05-24 MED ORDER — ATROPINE SULFATE 1 MG/ML IJ SOLN
0.5000 mg | Freq: Once | INTRAMUSCULAR | Status: AC
  Administered 2018-05-24: 0.5 mg via INTRAVENOUS
  Filled 2018-05-24: qty 1

## 2018-05-24 MED ORDER — SODIUM CHLORIDE 0.9 % IV SOLN
Freq: Once | INTRAVENOUS | Status: AC
  Administered 2018-05-24: 09:00:00 via INTRAVENOUS

## 2018-05-24 MED ORDER — SODIUM CHLORIDE 0.9 % IV SOLN
400.0000 mg | Freq: Once | INTRAVENOUS | Status: AC
  Administered 2018-05-24: 400 mg via INTRAVENOUS
  Filled 2018-05-24: qty 16

## 2018-05-24 MED ORDER — SODIUM CHLORIDE 0.9% FLUSH: 10.0000 mL | INTRAVENOUS | Status: DC | PRN

## 2018-05-24 MED ORDER — LEUCOVORIN CALCIUM INJECTION 350 MG
700.0000 mg | Freq: Once | INTRAVENOUS | Status: AC
  Administered 2018-05-24: 700 mg via INTRAVENOUS
  Filled 2018-05-24: qty 35

## 2018-05-24 MED ORDER — SODIUM CHLORIDE 0.9 % IV SOLN
Freq: Once | INTRAVENOUS | Status: AC
  Administered 2018-05-24: 12:00:00 via INTRAVENOUS

## 2018-05-24 MED ORDER — SODIUM CHLORIDE 0.9 % IV SOLN
10.0000 mg | Freq: Once | INTRAVENOUS | Status: AC
  Administered 2018-05-24: 10 mg via INTRAVENOUS
  Filled 2018-05-24: qty 1

## 2018-05-26 ENCOUNTER — Inpatient Hospital Stay (HOSPITAL_COMMUNITY): Payer: Medicare Other

## 2018-05-26 VITALS — BP 134/65 | HR 70 | Temp 97.9°F | Resp 18

## 2018-05-26 DIAGNOSIS — Z95828 Presence of other vascular implants and grafts: Secondary | ICD-10-CM

## 2018-05-26 DIAGNOSIS — C184 Malignant neoplasm of transverse colon: Secondary | ICD-10-CM

## 2018-05-26 DIAGNOSIS — Z5112 Encounter for antineoplastic immunotherapy: Secondary | ICD-10-CM | POA: Diagnosis not present

## 2018-05-26 MED ORDER — SODIUM CHLORIDE 0.9% FLUSH
10.0000 mL | INTRAVENOUS | Status: DC | PRN
  Administered 2018-05-26: 10 mL
  Filled 2018-05-26: qty 10

## 2018-05-26 MED ORDER — HEPARIN SOD (PORK) LOCK FLUSH 100 UNIT/ML IV SOLN
500.0000 [IU] | Freq: Once | INTRAVENOUS | Status: AC | PRN
  Administered 2018-05-26: 500 [IU]

## 2018-05-26 NOTE — Patient Instructions (Signed)
[facility] Discharge Instructions for Patients Receiving Chemotherapy  Today you received the following chemotherapy agents   To help prevent nausea and vomiting after your treatment, we encourage you to take your nausea medication   If you develop nausea and vomiting that is not controlled by your nausea medication, call the clinic.   BELOW ARE SYMPTOMS THAT SHOULD BE REPORTED IMMEDIATELY:  *FEVER GREATER THAN 100.5 F  *CHILLS WITH OR WITHOUT FEVER  NAUSEA AND VOMITING THAT IS NOT CONTROLLED WITH YOUR NAUSEA MEDICATION  *UNUSUAL SHORTNESS OF BREATH  *UNUSUAL BRUISING OR BLEEDING  TENDERNESS IN MOUTH AND THROAT WITH OR WITHOUT PRESENCE OF ULCERS  *URINARY PROBLEMS  *BOWEL PROBLEMS  UNUSUAL RASH Items with * indicate a potential emergency and should be followed up as soon as possible.  Feel free to call the clinic should you have any questions or concerns. The clinic phone number is [phone].  Please show the CHEMO ALERT CARD at check-in to the Emergency Department and triage nurse.   

## 2018-05-26 NOTE — Progress Notes (Signed)
Pt presents for pump d/c. VSS. No complaints at this time. Discharged from clinic ambulatory. F/U with [facility] as scheduled.

## 2018-06-04 ENCOUNTER — Other Ambulatory Visit (HOSPITAL_COMMUNITY): Payer: Medicare Other

## 2018-06-07 ENCOUNTER — Encounter (HOSPITAL_COMMUNITY): Payer: Self-pay

## 2018-06-07 ENCOUNTER — Inpatient Hospital Stay (HOSPITAL_COMMUNITY): Payer: Medicare Other

## 2018-06-07 ENCOUNTER — Encounter (HOSPITAL_COMMUNITY): Payer: Self-pay | Admitting: Hematology

## 2018-06-07 ENCOUNTER — Other Ambulatory Visit: Payer: Self-pay

## 2018-06-07 ENCOUNTER — Other Ambulatory Visit (HOSPITAL_COMMUNITY): Payer: Medicare Other

## 2018-06-07 ENCOUNTER — Inpatient Hospital Stay (HOSPITAL_BASED_OUTPATIENT_CLINIC_OR_DEPARTMENT_OTHER): Payer: Medicare Other | Admitting: Hematology

## 2018-06-07 VITALS — BP 111/57 | HR 72 | Temp 98.0°F | Resp 18 | Wt 178.6 lb

## 2018-06-07 VITALS — BP 122/59 | HR 60 | Temp 97.4°F | Resp 18

## 2018-06-07 DIAGNOSIS — C184 Malignant neoplasm of transverse colon: Secondary | ICD-10-CM

## 2018-06-07 DIAGNOSIS — C189 Malignant neoplasm of colon, unspecified: Secondary | ICD-10-CM

## 2018-06-07 DIAGNOSIS — I10 Essential (primary) hypertension: Secondary | ICD-10-CM

## 2018-06-07 DIAGNOSIS — Z79899 Other long term (current) drug therapy: Secondary | ICD-10-CM

## 2018-06-07 DIAGNOSIS — E119 Type 2 diabetes mellitus without complications: Secondary | ICD-10-CM

## 2018-06-07 DIAGNOSIS — Z5112 Encounter for antineoplastic immunotherapy: Secondary | ICD-10-CM | POA: Diagnosis not present

## 2018-06-07 DIAGNOSIS — G629 Polyneuropathy, unspecified: Secondary | ICD-10-CM | POA: Diagnosis not present

## 2018-06-07 DIAGNOSIS — Z95828 Presence of other vascular implants and grafts: Secondary | ICD-10-CM

## 2018-06-07 LAB — COMPREHENSIVE METABOLIC PANEL
ALT: 22 U/L (ref 0–44)
AST: 23 U/L (ref 15–41)
Albumin: 3.6 g/dL (ref 3.5–5.0)
Alkaline Phosphatase: 50 U/L (ref 38–126)
Anion gap: 9 (ref 5–15)
BUN: 17 mg/dL (ref 8–23)
CO2: 24 mmol/L (ref 22–32)
Calcium: 9.1 mg/dL (ref 8.9–10.3)
Chloride: 106 mmol/L (ref 98–111)
Creatinine, Ser: 1.01 mg/dL (ref 0.61–1.24)
GFR calc Af Amer: >60 mL/min (ref >60)
GFR calc non Af Amer: >60 mL/min (ref >60)
Glucose, Bld: 142 mg/dL — ABNORMAL HIGH (ref 70–99)
Potassium: 4.5 mmol/L (ref 3.5–5.1)
Sodium: 139 mmol/L (ref 135–145)
Total Bilirubin: 0.7 mg/dL (ref 0.3–1.2)
Total Protein: 6.6 g/dL (ref 6.5–8.1)

## 2018-06-07 LAB — CBC WITH DIFFERENTIAL/PLATELET
Abs Immature Granulocytes: 0.02 10*3/uL (ref 0.00–0.07)
Basophils Absolute: 0 10*3/uL (ref 0.0–0.1)
Basophils Relative: 0 %
Eosinophils Absolute: 0.2 10*3/uL (ref 0.0–0.5)
Eosinophils Relative: 2 %
HCT: 36.9 % — ABNORMAL LOW (ref 39.0–52.0)
Hemoglobin: 11.3 g/dL — ABNORMAL LOW (ref 13.0–17.0)
Immature Granulocytes: 0 %
Lymphocytes Relative: 31 %
Lymphs Abs: 2.4 10*3/uL (ref 0.7–4.0)
MCH: 33.3 pg (ref 26.0–34.0)
MCHC: 30.6 g/dL (ref 30.0–36.0)
MCV: 108.8 fL — ABNORMAL HIGH (ref 80.0–100.0)
Monocytes Absolute: 1 10*3/uL (ref 0.1–1.0)
Monocytes Relative: 13 %
Neutro Abs: 4.3 10*3/uL (ref 1.7–7.7)
Neutrophils Relative %: 54 %
Platelets: 204 10*3/uL (ref 150–400)
RBC: 3.39 MIL/uL — ABNORMAL LOW (ref 4.22–5.81)
RDW: 15.1 % (ref 11.5–15.5)
WBC: 8 10*3/uL (ref 4.0–10.5)
nRBC: 0 % (ref 0.0–0.2)

## 2018-06-07 LAB — URINALYSIS, DIPSTICK ONLY
Bilirubin Urine: NEGATIVE
Glucose, UA: NEGATIVE mg/dL
Hgb urine dipstick: NEGATIVE
Ketones, ur: NEGATIVE mg/dL
Leukocytes,Ua: NEGATIVE
Nitrite: NEGATIVE
Protein, ur: NEGATIVE mg/dL
Specific Gravity, Urine: 1.019 (ref 1.005–1.030)
pH: 5 (ref 5.0–8.0)

## 2018-06-07 MED ORDER — ATROPINE SULFATE 1 MG/ML IJ SOLN
0.5000 mg | Freq: Once | INTRAMUSCULAR | Status: AC
  Administered 2018-06-07: 0.5 mg via INTRAVENOUS
  Filled 2018-06-07: qty 1

## 2018-06-07 MED ORDER — SODIUM CHLORIDE 0.9 % IV SOLN
1920.0000 mg/m2 | INTRAVENOUS | Status: DC
  Administered 2018-06-07: 3950 mg via INTRAVENOUS
  Filled 2018-06-07: qty 79

## 2018-06-07 MED ORDER — SODIUM CHLORIDE 0.9% FLUSH
10.0000 mL | INTRAVENOUS | Status: DC | PRN
  Administered 2018-06-07: 10 mL
  Filled 2018-06-07: qty 10

## 2018-06-07 MED ORDER — LEUCOVORIN CALCIUM INJECTION 350 MG
700.0000 mg | Freq: Once | INTRAVENOUS | Status: AC
  Administered 2018-06-07: 700 mg via INTRAVENOUS
  Filled 2018-06-07: qty 25

## 2018-06-07 MED ORDER — SODIUM CHLORIDE 0.9 % IV SOLN
Freq: Once | INTRAVENOUS | Status: AC
  Administered 2018-06-07: 10:00:00 via INTRAVENOUS

## 2018-06-07 MED ORDER — PALONOSETRON HCL INJECTION 0.25 MG/5ML
0.2500 mg | Freq: Once | INTRAVENOUS | Status: AC
  Administered 2018-06-07: 0.25 mg via INTRAVENOUS
  Filled 2018-06-07: qty 5

## 2018-06-07 MED ORDER — SODIUM CHLORIDE 0.9 % IV SOLN
10.0000 mg | Freq: Once | INTRAVENOUS | Status: AC
  Administered 2018-06-07: 10 mg via INTRAVENOUS
  Filled 2018-06-07: qty 1

## 2018-06-07 MED ORDER — SODIUM CHLORIDE 0.9 % IV SOLN
400.0000 mg | Freq: Once | INTRAVENOUS | Status: AC
  Administered 2018-06-07: 400 mg via INTRAVENOUS
  Filled 2018-06-07: qty 16

## 2018-06-07 MED ORDER — IRINOTECAN HCL CHEMO INJECTION 100 MG/5ML
144.0000 mg/m2 | Freq: Once | INTRAVENOUS | Status: AC
  Administered 2018-06-07: 300 mg via INTRAVENOUS
  Filled 2018-06-07: qty 15

## 2018-06-07 MED ORDER — DIPHENHYDRAMINE HCL 25 MG PO CAPS
ORAL_CAPSULE | ORAL | Status: AC
  Filled 2018-06-07: qty 1

## 2018-06-07 MED ORDER — FLUOROURACIL CHEMO INJECTION 2.5 GM/50ML
320.0000 mg/m2 | Freq: Once | INTRAVENOUS | Status: AC
  Administered 2018-06-07: 650 mg via INTRAVENOUS
  Filled 2018-06-07: qty 13

## 2018-06-07 MED ORDER — SODIUM CHLORIDE 0.9 % IV SOLN
INTRAVENOUS | Status: DC
  Administered 2018-06-07: 11:00:00 via INTRAVENOUS

## 2018-06-07 NOTE — Patient Instructions (Signed)
[facility] Discharge Instructions for Patients Receiving Chemotherapy   Beginning January 23rd 2017 lab work for the Cancer Center will be done in the  Main lab at  on 1st floor. If you have a lab appointment with the Cancer Center please come in thru the  Main Entrance and check in at the main information desk   Today you received the following chemotherapy agents Avastin,Irinotecan,Leucovorin and 5FU. Follow-up as scheduled. Call clinic for any questions or concerns  To help prevent nausea and vomiting after your treatment, we encourage you to take your nausea medication   If you develop nausea and vomiting, or diarrhea that is not controlled by your medication, call the clinic.  The clinic phone number is [phone]. Office hours are Monday-Friday 8:30am-5:00pm.  BELOW ARE SYMPTOMS THAT SHOULD BE REPORTED IMMEDIATELY:  *FEVER GREATER THAN 101.0 F  *CHILLS WITH OR WITHOUT FEVER  NAUSEA AND VOMITING THAT IS NOT CONTROLLED WITH YOUR NAUSEA MEDICATION  *UNUSUAL SHORTNESS OF BREATH  *UNUSUAL BRUISING OR BLEEDING  TENDERNESS IN MOUTH AND THROAT WITH OR WITHOUT PRESENCE OF ULCERS  *URINARY PROBLEMS  *BOWEL PROBLEMS  UNUSUAL RASH Items with * indicate a potential emergency and should be followed up as soon as possible. If you have an emergency after office hours please contact your primary care physician or go to the nearest emergency department.  Please call the clinic during office hours if you have any questions or concerns.   You may also contact the Patient Navigator at [phone] should you have any questions or need assistance in obtaining follow up care.      Resources For Cancer Patients and their Caregivers ? American Cancer Society: Can assist with transportation, wigs, general needs, runs Look Good Feel Better.        1-888-227-6333 ? Cancer Care: Provides financial assistance, online support groups, medication/co-pay  assistance.  1-800-813-HOPE (4673) ? [facility] Assists [place] cancer patients and their families through emotional , educational and financial support.  336-427-4357 ? [place] DSS Where to apply for food stamps, Medicaid and utility assistance. 336-342-1394 ? [facility]: Transportation to medical appointments. 336-347-2287 ? Social Security Administration: May apply for disability if have a Stage IV cancer. 336-342-7796 1-800-772-1213 ? [place] Aging, Disability and Transit Services: Assists with nutrition, care and transit needs. 336-349-2343         

## 2018-06-07 NOTE — Progress Notes (Signed)
[id number] Lab and urine results reviewed with and pt seen by Dr. Delton Coombes and pt approved for chemo tx today per MD                                                                               Kevin Kirk tolerated chemo tx well without complaints or incident. Pt discharged with 5FU pump infusing without issues. VSS upon discharge. Pt discharged self ambulatory using his cane in satisfactory condition accompanied by a friend

## 2018-06-07 NOTE — Assessment & Plan Note (Signed)
1.  Stage IV colon cancer (pT3 pN1 M1) MSI -High: -K-ras mutation negative, BRAF V600E positive, PIK3CA, CDKN2A,TP53 - 12 cycles of FOLFOX from 08/26/2016 through 01/27/2017. -Opdivo from 02/23/2017 through 07/13/2017. -FOLFIRI and bevacizumab started on 07/27/2017 - CT CAP on 05/07/2018 shows similar right pelvic nodule and left abdominal omental metastasis.  No progressive disease.  Similar mild thoracic adenopathy.  Similar nonspecific bilateral pulmonary nodules. - We reviewed his blood work today.  He will proceed with cycle 23 without any dose modifications.  I plan to repeat CT scan 3 months from the last scan.  We will also obtain a CEA level. -I will see him back in 4 weeks for follow-up.  2.  Peripheral neuropathy: -He has tingling in the hands and feet which is constant. -He will continue gabapentin 600 mg twice daily.

## 2018-06-07 NOTE — Patient Instructions (Addendum)
[place] at [facility] Discharge Instructions  You were seen today by Dr. Delton Coombes. He went over your recent results. You will have treatment today. Try to stay away from big crowds You will have treatment again in 2 weeks.  He will see you back in 4 weeks for labs, treatment and follow up.   Thank you for choosing [place] at [facility] to provide your oncology and hematology care.  To afford each patient quality time with our provider, please arrive at least 15 minutes before your scheduled appointment time.   If you have a lab appointment with the [facility] please come in thru the  Main Entrance and check in at the main information desk  You need to re-schedule your appointment should you arrive 10 or more minutes late.  We strive to give you quality time with our providers, and arriving late affects you and other patients whose appointments are after yours.  Also, if you no show three or more times for appointments you may be dismissed from the clinic at the providers discretion.     Again, thank you for choosing [facility].  Our hope is that these requests will decrease the amount of time that you wait before being seen by our physicians.       _____________________________________________________________  Should you have questions after your visit to [facility], please contact our office at [phone] between the hours of 8:00 a.m. and 4:30 p.m.  Voicemails left after 4:00 p.m. will not be returned until the following business day.  For prescription refill requests, have your pharmacy contact our office and allow 72 hours.    Cancer Center Support Programs:   > Cancer Support Group  2nd Tuesday of the month 1pm-2pm, Journey Room

## 2018-06-07 NOTE — Progress Notes (Signed)
[place], [place] [address]   CLINIC:  Medical Oncology/Hematology  PCP:  Tobe Sos, MD [address] 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[place])   07/11/2016 Initial Diagnosis    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[place])    07/27/2017 -  Chemotherapy    The patient had palonosetron (ALOXI) injection 0.25 mg, 0.25 mg, Intravenous,  Once, 23 of 26 cycles Administration: 0.25 mg (07/27/2017), 0.25 mg (08/10/2017), 0.25 mg (08/24/2017), 0.25 mg (09/07/2017), 0.25 mg (09/21/2017), 0.25 mg (10/05/2017), 0.25 mg (10/19/2017), 0.25 mg (11/02/2017), 0.25 mg (11/16/2017), 0.25 mg (11/30/2017), 0.25 mg (12/14/2017), 0.25 mg (12/28/2017), 0.25 mg (01/12/2018), 0.25 mg (01/26/2018), 0.25 mg (02/09/2018), 0.25 mg (02/24/2018), 0.25 mg (03/10/2018), 0.25 mg (03/29/2018), 0.25 mg (04/12/2018), 0.25 mg (04/26/2018), 0.25 mg (05/10/2018), 0.25 mg (05/24/2018), 0.25 mg (06/07/2018) pegfilgrastim-cbqv (UDENYCA) injection 6 mg, 6 mg, Subcutaneous, Once, 7 of 7 cycles Administration: 6 mg (10/07/2017), 6 mg (10/21/2017), 6 mg (11/04/2017), 6 mg (11/18/2017), 6 mg (12/02/2017), 6 mg (12/16/2017), 6 mg (12/30/2017) bevacizumab (AVASTIN) 450 mg in sodium chloride 0.9 % 100 mL chemo infusion, 5 mg/kg = 450 mg, Intravenous,  Once, 23 of 26 cycles Dose modification: 4 mg/kg (80 % of original dose 5 mg/kg, Cycle 5, Reason: Provider Judgment), 5 mg/kg (original dose 5 mg/kg, Cycle 16, Reason: Other (see comments)) Administration: 450 mg (07/27/2017), 450 mg (08/10/2017), 450 mg (08/24/2017), 450 mg (09/07/2017), 450 mg (09/21/2017), 450 mg (10/05/2017), 450 mg (10/19/2017), 450 mg (11/02/2017), 450 mg (11/16/2017), 450 mg (11/30/2017), 450 mg (12/14/2017), 450 mg (12/28/2017), 400 mg (01/12/2018), 400 mg (01/26/2018), 400 mg (02/09/2018), 400 mg (02/24/2018), 400 mg (03/10/2018), 400  mg (03/29/2018), 400 mg (04/12/2018), 400 mg (04/26/2018), 400 mg (05/10/2018), 400 mg (05/24/2018), 400 mg (06/07/2018) irinotecan (CAMPTOSAR) 400 mg in dextrose 5 % 500 mL chemo infusion, 180 mg/m2 = 400 mg, Intravenous,  Once, 23 of 26 cycles Dose modification: 144 mg/m2 (80 % of original dose 180 mg/m2, Cycle 5, Reason: Provider Judgment) Administration: 400 mg (07/27/2017), 400 mg (08/10/2017), 400 mg (08/24/2017), 400 mg (09/07/2017), 320 mg (09/21/2017), 320 mg (10/05/2017), 320 mg (10/19/2017), 320 mg (11/02/2017), 320 mg (11/16/2017), 320 mg (11/30/2017), 320 mg (12/14/2017), 320 mg (12/28/2017), 320 mg (01/12/2018), 320 mg (01/26/2018), 320 mg (02/09/2018), 300 mg (02/24/2018), 300 mg (03/10/2018), 300 mg (03/29/2018), 300 mg (04/12/2018), 300 mg (04/26/2018), 300 mg (05/10/2018), 300 mg (05/24/2018), 300 mg (06/07/2018) leucovorin 800 mg in dextrose 5 % 250 mL infusion, 872 mg, Intravenous,  Once, 23 of 26 cycles Dose modification: 320 mg/m2 (80 % of original dose 400 mg/m2, Cycle 5, Reason: Provider Judgment) Administration: 800 mg (07/27/2017), 800 mg (08/10/2017), 800 mg (08/24/2017), 800 mg (09/07/2017), 700 mg (09/21/2017), 700 mg (10/05/2017), 700 mg (10/19/2017), 700 mg (11/02/2017), 700 mg (11/16/2017), 700 mg (11/30/2017), 700 mg (12/14/2017), 700 mg (12/28/2017), 700 mg (01/12/2018), 700 mg (01/26/2018), 700 mg (02/09/2018), 700 mg (02/24/2018), 700 mg (03/10/2018), 700 mg (03/29/2018), 700 mg (04/12/2018), 700 mg (04/26/2018), 700 mg (05/10/2018), 700 mg (05/24/2018), 700 mg (06/07/2018) fluorouracil (ADRUCIL) chemo injection 850 mg, 400 mg/m2 = 850 mg, Intravenous,  Once, 23 of 26 cycles Dose modification: 320 mg/m2 (80 % of original dose 400 mg/m2, Cycle 5, Reason: Provider Judgment) Administration: 850 mg (07/27/2017), 850 mg (08/10/2017), 850 mg (08/24/2017), 850 mg (09/07/2017), 700 mg (09/21/2017), 700 mg (10/05/2017), 700 mg (  10/19/2017), 700 mg (11/02/2017), 700 mg (11/16/2017), 700 mg (11/30/2017), 700 mg (12/14/2017), 700 mg (12/28/2017), 700 mg  (01/12/2018), 700 mg (01/26/2018), 700 mg (02/09/2018), 650 mg (02/24/2018), 650 mg (03/10/2018), 650 mg (03/29/2018), 650 mg (04/12/2018), 650 mg (04/26/2018), 650 mg (05/10/2018), 650 mg (05/24/2018) fluorouracil (ADRUCIL) 5,250 mg in sodium chloride 0.9 % 145 mL chemo infusion, 2,400 mg/m2 = 5,250 mg, Intravenous, 1 Day/Dose, 23 of 26 cycles Dose modification: 1,920 mg/m2 (80 % of original dose 2,400 mg/m2, Cycle 5, Reason: Provider Judgment) Administration: 5,250 mg (07/27/2017), 5,250 mg (08/10/2017), 5,250 mg (08/24/2017), 5,250 mg (09/07/2017), 4,200 mg (09/21/2017), 4,200 mg (10/05/2017), 4,200 mg (10/19/2017), 4,200 mg (11/02/2017), 4,200 mg (11/16/2017), 4,200 mg (11/30/2017), 4,200 mg (12/14/2017), 4,200 mg (12/28/2017), 4,200 mg (01/12/2018), 4,200 mg (01/26/2018), 4,200 mg (02/09/2018), 3,950 mg (02/24/2018), 3,950 mg (03/10/2018), 3,950 mg (03/29/2018), 3,950 mg (04/12/2018), 3,950 mg (04/26/2018), 3,950 mg (05/10/2018), 3,950 mg (05/24/2018)  for chemotherapy treatment.       CANCER STAGING: Cancer Staging Malignant neoplasm of transverse colon [facility]) Staging form: Colon and Rectum, AJCC 8th Edition - Clinical: cT3, cN1a, cM1 - Signed by Twana First, MD on 03/09/2017 - Pathologic: No stage assigned - Unsigned    INTERVAL HISTORY:  Mr. Neels 85 y.o. male returns for routine follow-up and consideration for next cycle of chemotherapy. He is here today with is friend. He states that he has had some sinus issues, runny nose, taking mucinex. Denies any nausea, vomiting, or diarrhea. Denies any new pains. Had not noticed any recent bleeding such as epistaxis, hematuria or hematochezia. Denies recent chest pain on exertion, shortness of breath on minimal exertion, pre-syncopal episodes, or palpitations. Denies any numbness or tingling in hands or feet. Denies any recent fevers, infections, or recent hospitalizations. Patient reports appetite at 100% and energy level at 100%.     REVIEW OF SYSTEMS:  Review of Systems   Respiratory:       Running nose present.  All other systems reviewed and are negative.    PAST MEDICAL/SURGICAL HISTORY:  Past Medical History:  Diagnosis Date  . Chronic pulmonary embolism ([place]) 42/7062   compliction of the valve replacement surgery  . COPD (chronic obstructive pulmonary disease) ([place])   . Diabetes ([place]) 04/22/2016  . H/O aortic valve replacement 10/2008  . Hypercholesteremia   . Hypertension   . Pulmonary emboli ([place]) 10/2008  . Sleep apnea    Past Surgical History:  Procedure Laterality Date  . AORTIC VALVE REPLACEMENT     2010  . CARDIAC CATHETERIZATION  2010  . COLONOSCOPY N/A 06/05/2016   Procedure: COLONOSCOPY;  Surgeon: Rogene Houston, MD;  Location: [facility] ENDO SUITE;  Service: Endoscopy;  Laterality: N/A;  . PARTIAL COLECTOMY N/A 07/11/2016   Procedure: PARTIAL COLECTOMY;  Surgeon: Aviva Signs, MD;  Location: [facility] ORS;  Service: General;  Laterality: N/A;  . PORTACATH PLACEMENT N/A 08/13/2016   Procedure: INSERTION PORT-A-CATH LEFT SUBCLAVIAN;  Surgeon: Aviva Signs, MD;  Location: [facility] ORS;  Service: General;  Laterality: N/A;  . REPLACEMENT TOTAL KNEE     1996  . spenectomy     from trauma     SOCIAL HISTORY:  Social History   Socioeconomic History  . Marital status: Widowed    Spouse name: Not on file  . Number of children: Not on file  . Years of education: Not on file  . Highest education level: Not on file  Occupational History  . Not on file  Social Needs  . Financial resource strain: Not  on file  . Food insecurity:    Worry: Not on file    Inability: Not on file  . Transportation needs:    Medical: Not on file    Non-medical: Not on file  Tobacco Use  . Smoking status: Former Smoker    Years: 15.00    Types: Cigarettes    Last attempt to quit: 1964    Years since quitting: 56.2  . Smokeless tobacco: Never Used  Substance and Sexual Activity  . Alcohol use: No  . Drug use: No  . Sexual activity: Yes  Lifestyle  . Physical  activity:    Days per week: Not on file    Minutes per session: Not on file  . Stress: Not on file  Relationships  . Social connections:    Talks on phone: Not on file    Gets together: Not on file    Attends religious service: Not on file    Active member of club or organization: Not on file    Attends meetings of clubs or organizations: Not on file    Relationship status: Not on file  . Intimate partner violence:    Fear of current or ex partner: Not on file    Emotionally abused: Not on file    Physically abused: Not on file    Forced sexual activity: Not on file  Other Topics Concern  . Not on file  Social History Narrative  . Not on file    FAMILY HISTORY:  Family History  Problem Relation Age of Onset  . Colon cancer Neg Hx     CURRENT MEDICATIONS:  Outpatient Encounter Medications as of 06/07/2018  Medication Sig Note  . atorvastatin (LIPITOR) 10 MG tablet Take 10 mg by mouth at bedtime.    . Cholecalciferol (VITAMIN D3) 5000 units CAPS Take 5,000 Units by mouth daily.    . diphenoxylate-atropine (LOMOTIL) 2.5-0.025 MG tablet Take 1 tablet by mouth 4 (four) times daily as needed for diarrhea or loose stools.   . gabapentin (NEURONTIN) 600 MG tablet Take 1 tablet (600 mg total) by mouth 2 (two) times daily.   . metoprolol (LOPRESSOR) 50 MG tablet Take 50 mg by mouth 2 (two) times daily.   . Multiple Vitamins-Minerals (CENTRUM SILVER 50+MEN PO) Take 1 tablet by mouth daily.   Marland Kitchen RELION GLUCOSE TEST STRIPS test strip USE 1 STRIP TO CHECK GLUCOSE ONCE DAILY   . vitamin B-12 (CYANOCOBALAMIN) 1000 MCG tablet Take 1,000 mcg by mouth daily.    . [DISCONTINUED] prochlorperazine (COMPAZINE) 10 MG tablet Take 1 tablet (10 mg total) by mouth every 6 (six) hours as needed (Nausea or vomiting). 08/05/2016: New Med   Facility-Administered Encounter Medications as of 06/07/2018  Medication  . sodium chloride flush (NS) 0.9 % injection 10 mL    ALLERGIES:  Allergies  Allergen  Reactions  . Amoxicillin Hives    Has patient had a PCN reaction causing immediate rash, facial/tongue/throat swelling, SOB or lightheadedness with hypotension: No Has patient had a PCN reaction causing severe rash involving mucus membranes or skin necrosis: Yes Has patient had a PCN reaction that required hospitalization No Has patient had a PCN reaction occurring within the last 10 years: No If all of the above answers are "NO", then may proceed with Cephalosporin use.   . Tamsulosin Hcl     Cant sleep, confusion      PHYSICAL EXAM:  ECOG Performance status: 1  Vitals:   06/07/18 0905  BP: Marland Kitchen)  111/57  Pulse: 72  Resp: 18  Temp: 98 F (36.7 C)  SpO2: 98%   Filed Weights   06/07/18 0905  Weight: 178 lb 9.6 oz (81 kg)    Physical Exam Constitutional:      Appearance: Normal appearance.  Cardiovascular:     Rate and Rhythm: Normal rate and regular rhythm.     Heart sounds: Normal heart sounds.  Pulmonary:     Effort: Pulmonary effort is normal.     Breath sounds: Normal breath sounds.  Abdominal:     General: Bowel sounds are normal. There is no distension.     Palpations: Abdomen is soft.  Musculoskeletal:        General: No swelling.  Skin:    General: Skin is warm.  Neurological:     General: No focal deficit present.     Mental Status: He is alert and oriented to person, place, and time.  Psychiatric:        Mood and Affect: Mood normal.        Behavior: Behavior normal.      LABORATORY DATA:  I have reviewed the labs as listed.  CBC    Component Value Date/Time   WBC 8.0 06/07/2018 0856   RBC 3.39 (L) 06/07/2018 0856   HGB 11.3 (L) 06/07/2018 0856   HCT 36.9 (L) 06/07/2018 0856   PLT 204 06/07/2018 0856   MCV 108.8 (H) 06/07/2018 0856   MCH 33.3 06/07/2018 0856   MCHC 30.6 06/07/2018 0856   RDW 15.1 06/07/2018 0856   LYMPHSABS 2.4 06/07/2018 0856   MONOABS 1.0 06/07/2018 0856   EOSABS 0.2 06/07/2018 0856   BASOSABS 0.0 06/07/2018 0856    CMP Latest Ref Rng & Units 06/07/2018 05/24/2018 05/07/2018  Glucose 70 - 99 mg/dL 142(H) 135(H) 135(H)  BUN 8 - 23 mg/dL '17 15 13  ' Creatinine 0.61 - 1.24 mg/dL 1.01 0.99 0.91  Sodium 135 - 145 mmol/L 139 140 138  Potassium 3.5 - 5.1 mmol/L 4.5 4.8 5.2(H)  Chloride 98 - 111 mmol/L 106 106 103  CO2 22 - 32 mmol/L '24 26 26  ' Calcium 8.9 - 10.3 mg/dL 9.1 9.0 9.5  Total Protein 6.5 - 8.1 g/dL 6.6 6.6 7.3  Total Bilirubin 0.3 - 1.2 mg/dL 0.7 0.8 0.7  Alkaline Phos 38 - 126 U/L 50 39 39  AST 15 - 41 U/L '23 24 24  ' ALT 0 - 44 U/L '22 17 18       ' DIAGNOSTIC IMAGING:  I have independently reviewed the scans and discussed with the patient.   I have reviewed Venita Lick LPN's note and agree with the documentation.  I personally performed a face-to-face visit, made revisions and my assessment and plan is as follows.    ASSESSMENT & PLAN:   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[place]) 1.  Stage IV colon cancer (pT3 pN1 M1) MSI -High: -K-ras mutation negative, BRAF V600E positive, PIK3CA, CDKN2A,TP53 - 12 cycles of FOLFOX from 08/26/2016 through 01/27/2017. -Opdivo from 02/23/2017 through 07/13/2017. -FOLFIRI and bevacizumab started on 07/27/2017 - CT CAP on 05/07/2018 shows similar right pelvic nodule and left abdominal omental metastasis.  No progressive disease.  Similar mild thoracic adenopathy.  Similar nonspecific bilateral pulmonary nodules. - We reviewed his blood work today.  He will proceed with cycle 23 without any dose modifications.  I plan to repeat CT scan 3 months from the last scan.  We will also obtain a CEA level. -I will see him back in  4 weeks for follow-up.  2.  Peripheral neuropathy: -He has tingling in the hands and feet which is constant. -He will continue gabapentin 600 mg twice daily.   Total time spent is 25 minutes with more than 50% of the time spent face-to-face discussing scan results, management and coordination of care.    Orders placed this encounter:  Orders  Placed This Encounter  Procedures  . CBC with Differential/Platelet  . Comprehensive metabolic panel  . Urinalysis, dipstick only  . CEA      Derek Jack, MD [place] [phone]

## 2018-06-09 ENCOUNTER — Other Ambulatory Visit: Payer: Self-pay

## 2018-06-09 ENCOUNTER — Encounter (HOSPITAL_COMMUNITY): Payer: Self-pay

## 2018-06-09 ENCOUNTER — Inpatient Hospital Stay (HOSPITAL_COMMUNITY): Payer: Medicare Other

## 2018-06-09 VITALS — BP 126/74 | HR 62 | Temp 97.6°F | Resp 18

## 2018-06-09 DIAGNOSIS — C184 Malignant neoplasm of transverse colon: Secondary | ICD-10-CM

## 2018-06-09 DIAGNOSIS — Z5112 Encounter for antineoplastic immunotherapy: Secondary | ICD-10-CM | POA: Diagnosis not present

## 2018-06-09 DIAGNOSIS — Z95828 Presence of other vascular implants and grafts: Secondary | ICD-10-CM

## 2018-06-09 MED ORDER — SODIUM CHLORIDE 0.9% FLUSH
10.0000 mL | INTRAVENOUS | Status: DC | PRN
  Administered 2018-06-09: 10 mL
  Filled 2018-06-09: qty 10

## 2018-06-09 MED ORDER — HEPARIN SOD (PORK) LOCK FLUSH 100 UNIT/ML IV SOLN
500.0000 [IU] | Freq: Once | INTRAVENOUS | Status: AC | PRN
  Administered 2018-06-09: 500 [IU]

## 2018-06-09 NOTE — Progress Notes (Signed)
Kevin Kirk tolerated 5FU pump well without complaints or incident 5FU pump discontinued with portacath flushed with 10 ml NS and 5 ml Heparin easily per protocol then de-accessed. VSS Pt discharged self ambulatory using his cane in satisfactory condition

## 2018-06-09 NOTE — Patient Instructions (Signed)
[facility] at [facility] Discharge Instructions  5FU pump discontinued with portacath flushed per protocol. Follow-up as scheduled. Call clinic for any questions or concerns   Thank you for choosing [facility] at [facility] to provide your oncology and hematology care.  To afford each patient quality time with our provider, please arrive at least 15 minutes before your scheduled appointment time.   If you have a lab appointment with the Cancer Center please come in thru the  Main Entrance and check in at the main information desk  You need to re-schedule your appointment should you arrive 10 or more minutes late.  We strive to give you quality time with our providers, and arriving late affects you and other patients whose appointments are after yours.  Also, if you no show three or more times for appointments you may be dismissed from the clinic at the providers discretion.     Again, thank you for choosing [facility].  Our hope is that these requests will decrease the amount of time that you wait before being seen by our physicians.       _____________________________________________________________  Should you have questions after your visit to [facility], please contact our office at [phone] between the hours of 8:00 a.m. and 4:30 p.m.  Voicemails left after 4:00 p.m. will not be returned until the following business day.  For prescription refill requests, have your pharmacy contact our office and allow 72 hours.    Cancer Center Support Programs:   > Cancer Support Group  2nd Tuesday of the month 1pm-2pm, Journey Room   

## 2018-06-21 ENCOUNTER — Encounter (HOSPITAL_COMMUNITY): Payer: Self-pay

## 2018-06-21 ENCOUNTER — Inpatient Hospital Stay (HOSPITAL_COMMUNITY): Payer: Medicare Other

## 2018-06-21 ENCOUNTER — Other Ambulatory Visit: Payer: Self-pay

## 2018-06-21 VITALS — BP 133/63 | HR 55 | Temp 97.8°F | Resp 18 | Wt 173.6 lb

## 2018-06-21 DIAGNOSIS — Z5112 Encounter for antineoplastic immunotherapy: Secondary | ICD-10-CM | POA: Diagnosis not present

## 2018-06-21 DIAGNOSIS — Z95828 Presence of other vascular implants and grafts: Secondary | ICD-10-CM

## 2018-06-21 DIAGNOSIS — C184 Malignant neoplasm of transverse colon: Secondary | ICD-10-CM

## 2018-06-21 LAB — CBC WITH DIFFERENTIAL/PLATELET
Abs Immature Granulocytes: 0.02 10*3/uL (ref 0.00–0.07)
Basophils Absolute: 0 10*3/uL (ref 0.0–0.1)
Basophils Relative: 0 %
Eosinophils Absolute: 0.1 10*3/uL (ref 0.0–0.5)
Eosinophils Relative: 2 %
HCT: 35.2 % — ABNORMAL LOW (ref 39.0–52.0)
Hemoglobin: 11.4 g/dL — ABNORMAL LOW (ref 13.0–17.0)
Immature Granulocytes: 0 %
Lymphocytes Relative: 41 %
Lymphs Abs: 2.7 10*3/uL (ref 0.7–4.0)
MCH: 34.5 pg — ABNORMAL HIGH (ref 26.0–34.0)
MCHC: 32.4 g/dL (ref 30.0–36.0)
MCV: 106.7 fL — ABNORMAL HIGH (ref 80.0–100.0)
Monocytes Absolute: 1.1 10*3/uL — ABNORMAL HIGH (ref 0.1–1.0)
Monocytes Relative: 17 %
Neutro Abs: 2.6 10*3/uL (ref 1.7–7.7)
Neutrophils Relative %: 40 %
Platelets: 211 10*3/uL (ref 150–400)
RBC: 3.3 MIL/uL — ABNORMAL LOW (ref 4.22–5.81)
RDW: 15.2 % (ref 11.5–15.5)
WBC: 6.5 10*3/uL (ref 4.0–10.5)
nRBC: 0 % (ref 0.0–0.2)

## 2018-06-21 LAB — COMPREHENSIVE METABOLIC PANEL
ALT: 20 U/L (ref 0–44)
AST: 29 U/L (ref 15–41)
Albumin: 3.9 g/dL (ref 3.5–5.0)
Alkaline Phosphatase: 47 U/L (ref 38–126)
Anion gap: 10 (ref 5–15)
BUN: 24 mg/dL — ABNORMAL HIGH (ref 8–23)
CO2: 22 mmol/L (ref 22–32)
Calcium: 9.1 mg/dL (ref 8.9–10.3)
Chloride: 107 mmol/L (ref 98–111)
Creatinine, Ser: 1.28 mg/dL — ABNORMAL HIGH (ref 0.61–1.24)
GFR calc Af Amer: 59 mL/min — ABNORMAL LOW (ref >60)
GFR calc non Af Amer: 51 mL/min — ABNORMAL LOW (ref >60)
Glucose, Bld: 152 mg/dL — ABNORMAL HIGH (ref 70–99)
Potassium: 4.1 mmol/L (ref 3.5–5.1)
Sodium: 139 mmol/L (ref 135–145)
Total Bilirubin: 0.9 mg/dL (ref 0.3–1.2)
Total Protein: 7.1 g/dL (ref 6.5–8.1)

## 2018-06-21 LAB — URINALYSIS, DIPSTICK ONLY
Bilirubin Urine: NEGATIVE
Glucose, UA: NEGATIVE mg/dL
Hgb urine dipstick: NEGATIVE
Ketones, ur: NEGATIVE mg/dL
Leukocytes,Ua: NEGATIVE
Nitrite: NEGATIVE
Protein, ur: NEGATIVE mg/dL
Specific Gravity, Urine: 1.025 (ref 1.005–1.030)
pH: 5 (ref 5.0–8.0)

## 2018-06-21 MED ORDER — SODIUM CHLORIDE 0.9 % IV SOLN
Freq: Once | INTRAVENOUS | Status: AC
  Administered 2018-06-21: 09:00:00 via INTRAVENOUS

## 2018-06-21 MED ORDER — IRINOTECAN HCL CHEMO INJECTION 100 MG/5ML
144.0000 mg/m2 | Freq: Once | INTRAVENOUS | Status: AC
  Administered 2018-06-21: 300 mg via INTRAVENOUS
  Filled 2018-06-21: qty 15

## 2018-06-21 MED ORDER — SODIUM CHLORIDE 0.9% FLUSH
10.0000 mL | INTRAVENOUS | Status: DC | PRN
  Administered 2018-06-21: 10 mL
  Filled 2018-06-21: qty 10

## 2018-06-21 MED ORDER — ATROPINE SULFATE 1 MG/ML IJ SOLN
0.5000 mg | Freq: Once | INTRAMUSCULAR | Status: AC
  Administered 2018-06-21: 0.5 mg via INTRAVENOUS
  Filled 2018-06-21: qty 1

## 2018-06-21 MED ORDER — SODIUM CHLORIDE 0.9 % IV SOLN
1920.0000 mg/m2 | INTRAVENOUS | Status: DC
  Administered 2018-06-21: 3950 mg via INTRAVENOUS
  Filled 2018-06-21: qty 79

## 2018-06-21 MED ORDER — LEUCOVORIN CALCIUM INJECTION 350 MG
700.0000 mg | Freq: Once | INTRAVENOUS | Status: AC
  Administered 2018-06-21: 700 mg via INTRAVENOUS
  Filled 2018-06-21: qty 35

## 2018-06-21 MED ORDER — SODIUM CHLORIDE 0.9 % IV SOLN
INTRAVENOUS | Status: DC
  Administered 2018-06-21: 09:00:00 via INTRAVENOUS

## 2018-06-21 MED ORDER — SODIUM CHLORIDE 0.9 % IV SOLN
400.0000 mg | Freq: Once | INTRAVENOUS | Status: AC
  Administered 2018-06-21: 400 mg via INTRAVENOUS
  Filled 2018-06-21: qty 16

## 2018-06-21 MED ORDER — FLUOROURACIL CHEMO INJECTION 2.5 GM/50ML
320.0000 mg/m2 | Freq: Once | INTRAVENOUS | Status: AC
  Administered 2018-06-21: 650 mg via INTRAVENOUS
  Filled 2018-06-21: qty 13

## 2018-06-21 MED ORDER — PALONOSETRON HCL INJECTION 0.25 MG/5ML
0.2500 mg | Freq: Once | INTRAVENOUS | Status: AC
  Administered 2018-06-21: 0.25 mg via INTRAVENOUS
  Filled 2018-06-21: qty 5

## 2018-06-21 MED ORDER — SODIUM CHLORIDE 0.9 % IV SOLN
10.0000 mg | Freq: Once | INTRAVENOUS | Status: AC
  Administered 2018-06-21: 10 mg via INTRAVENOUS
  Filled 2018-06-21: qty 10

## 2018-06-21 NOTE — Patient Instructions (Signed)
[facility] Discharge Instructions for Patients Receiving Chemotherapy   Beginning January 23rd 2017 lab work for the [facility] will be done in the  Main lab at [facility] on 1st floor. If you have a lab appointment with the [facility] please come in thru the  Main Entrance and check in at the main information desk   Labs drawn through port today  To help prevent nausea and vomiting after your treatment, we encourage you to take your nausea medication   If you develop nausea and vomiting, or diarrhea that is not controlled by your medication, call the clinic.  The clinic phone number is [phone]. Office hours are Monday-Friday 8:30am-5:00pm.  BELOW ARE SYMPTOMS THAT SHOULD BE REPORTED IMMEDIATELY:  *FEVER GREATER THAN 101.0 F  *CHILLS WITH OR WITHOUT FEVER  NAUSEA AND VOMITING THAT IS NOT CONTROLLED WITH YOUR NAUSEA MEDICATION  *UNUSUAL SHORTNESS OF BREATH  *UNUSUAL BRUISING OR BLEEDING  TENDERNESS IN MOUTH AND THROAT WITH OR WITHOUT PRESENCE OF ULCERS  *URINARY PROBLEMS  *BOWEL PROBLEMS  UNUSUAL RASH Items with * indicate a potential emergency and should be followed up as soon as possible. If you have an emergency after office hours please contact your primary care physician or go to the nearest emergency department.  Please call the clinic during office hours if you have any questions or concerns.   You may also contact the Patient Navigator at [phone] should you have any questions or need assistance in obtaining follow up care.      Resources For Cancer Patients and their Caregivers ? American Cancer Society: Can assist with transportation, wigs, general needs, runs Look Good Feel Better.        8582302690 ? Cancer Care: Provides financial assistance, online support groups, medication/co-pay assistance.  1-800-813-HOPE 9897590636) ? [place] Assists [place] cancer patients and their families  through emotional , educational and financial support.  563 211 3494 ? [place] DSS Where to apply for food stamps, Medicaid and utility assistance. (726)289-3927 ? [facility]: Transportation to medical appointments. (256)582-0171 ? Social Security Administration: May apply for disability if have a Stage IV cancer. (224)733-4748 (813)728-5543 ? [employer], Disability and Transit Services: Assists with nutrition, care and transit needs. ([phone] 624-4038

## 2018-06-21 NOTE — Patient Instructions (Signed)
[facility] Discharge Instructions for Patients Receiving Chemotherapy  Today you received the following chemotherapy agents  If you develop nausea and vomiting that is not controlled by your nausea medication, call the clinic.   BELOW ARE SYMPTOMS THAT SHOULD BE REPORTED IMMEDIATELY:  *FEVER GREATER THAN 100.5 F  *CHILLS WITH OR WITHOUT FEVER  NAUSEA AND VOMITING THAT IS NOT CONTROLLED WITH YOUR NAUSEA MEDICATION  *UNUSUAL SHORTNESS OF BREATH  *UNUSUAL BRUISING OR BLEEDING  TENDERNESS IN MOUTH AND THROAT WITH OR WITHOUT PRESENCE OF ULCERS  *URINARY PROBLEMS  *BOWEL PROBLEMS  UNUSUAL RASH Items with * indicate a potential emergency and should be followed up as soon as possible.  Feel free to call the clinic should you have any questions or concerns. The clinic phone number is [phone].  Please show the CHEMO ALERT CARD at check-in to the Emergency Department and triage nurse.   

## 2018-06-21 NOTE — Progress Notes (Signed)
Patient tolerated chemotherapy with no complaints voiced.  Port site clean and dry with no bruising or swelling noted at site.  Good blood return noted before and after administration of chemotherapy.  Chemotherapy pump connected with no alarms noted.   Patient left ambulatory with VSS and no s/s of distress noted.

## 2018-06-22 LAB — CEA: CEA: 6.8 ng/mL — ABNORMAL HIGH (ref 0.0–4.7)

## 2018-06-23 ENCOUNTER — Inpatient Hospital Stay (HOSPITAL_COMMUNITY): Payer: Medicare Other | Attending: Hematology

## 2018-06-23 ENCOUNTER — Other Ambulatory Visit: Payer: Self-pay

## 2018-06-23 VITALS — BP 135/63 | HR 67 | Temp 97.6°F | Resp 18

## 2018-06-23 DIAGNOSIS — Z5111 Encounter for antineoplastic chemotherapy: Secondary | ICD-10-CM | POA: Diagnosis not present

## 2018-06-23 DIAGNOSIS — C184 Malignant neoplasm of transverse colon: Secondary | ICD-10-CM | POA: Insufficient documentation

## 2018-06-23 DIAGNOSIS — Z79899 Other long term (current) drug therapy: Secondary | ICD-10-CM | POA: Diagnosis not present

## 2018-06-23 DIAGNOSIS — R634 Abnormal weight loss: Secondary | ICD-10-CM | POA: Diagnosis not present

## 2018-06-23 DIAGNOSIS — T451X5A Adverse effect of antineoplastic and immunosuppressive drugs, initial encounter: Secondary | ICD-10-CM

## 2018-06-23 DIAGNOSIS — C786 Secondary malignant neoplasm of retroperitoneum and peritoneum: Secondary | ICD-10-CM | POA: Insufficient documentation

## 2018-06-23 DIAGNOSIS — R197 Diarrhea, unspecified: Secondary | ICD-10-CM | POA: Insufficient documentation

## 2018-06-23 DIAGNOSIS — K521 Toxic gastroenteritis and colitis: Secondary | ICD-10-CM

## 2018-06-23 DIAGNOSIS — G629 Polyneuropathy, unspecified: Secondary | ICD-10-CM | POA: Diagnosis not present

## 2018-06-23 DIAGNOSIS — Z95828 Presence of other vascular implants and grafts: Secondary | ICD-10-CM

## 2018-06-23 MED ORDER — HEPARIN SOD (PORK) LOCK FLUSH 100 UNIT/ML IV SOLN
500.0000 [IU] | Freq: Once | INTRAVENOUS | Status: AC | PRN
  Administered 2018-06-23: 500 [IU]

## 2018-06-23 MED ORDER — DIPHENOXYLATE-ATROPINE 2.5-0.025 MG PO TABS: 1.0000 | ORAL_TABLET | Freq: Four times a day (QID) | ORAL | 3 refills | Status: DC | PRN

## 2018-06-23 MED ORDER — SODIUM CHLORIDE 0.9% FLUSH
10.0000 mL | INTRAVENOUS | Status: DC | PRN
  Administered 2018-06-23: 10 mL
  Filled 2018-06-23: qty 10

## 2018-06-23 NOTE — Progress Notes (Signed)
Kevin Kirk returns today for port de access and flush after 46 hr continous infusion of 42fu. Tolerated infusion without problems. Portacath located left chest wall was  deaccessed and flushed with 13ml NS and 500U/68ml Heparin and needle removed intact.  Procedure without incident. Patient tolerated procedure well.  Vitals stable and discharged home from clinic ambulatory. Follow up as scheduled.

## 2018-06-30 ENCOUNTER — Other Ambulatory Visit (HOSPITAL_COMMUNITY): Payer: Self-pay

## 2018-06-30 DIAGNOSIS — C184 Malignant neoplasm of transverse colon: Secondary | ICD-10-CM

## 2018-07-05 ENCOUNTER — Encounter (HOSPITAL_COMMUNITY): Payer: Self-pay | Admitting: Hematology

## 2018-07-05 ENCOUNTER — Other Ambulatory Visit: Payer: Self-pay

## 2018-07-05 ENCOUNTER — Inpatient Hospital Stay (HOSPITAL_BASED_OUTPATIENT_CLINIC_OR_DEPARTMENT_OTHER): Payer: Medicare Other | Admitting: Hematology

## 2018-07-05 ENCOUNTER — Inpatient Hospital Stay (HOSPITAL_COMMUNITY): Payer: Medicare Other

## 2018-07-05 ENCOUNTER — Encounter (HOSPITAL_COMMUNITY): Payer: Self-pay

## 2018-07-05 VITALS — BP 102/48 | HR 79 | Temp 98.1°F | Resp 18 | Wt 174.8 lb

## 2018-07-05 VITALS — BP 128/57 | HR 63 | Temp 97.6°F | Resp 18

## 2018-07-05 DIAGNOSIS — C786 Secondary malignant neoplasm of retroperitoneum and peritoneum: Secondary | ICD-10-CM | POA: Diagnosis not present

## 2018-07-05 DIAGNOSIS — C184 Malignant neoplasm of transverse colon: Secondary | ICD-10-CM

## 2018-07-05 DIAGNOSIS — R197 Diarrhea, unspecified: Secondary | ICD-10-CM | POA: Diagnosis not present

## 2018-07-05 DIAGNOSIS — Z79899 Other long term (current) drug therapy: Secondary | ICD-10-CM

## 2018-07-05 DIAGNOSIS — R634 Abnormal weight loss: Secondary | ICD-10-CM

## 2018-07-05 DIAGNOSIS — Z5111 Encounter for antineoplastic chemotherapy: Secondary | ICD-10-CM | POA: Diagnosis not present

## 2018-07-05 DIAGNOSIS — G629 Polyneuropathy, unspecified: Secondary | ICD-10-CM

## 2018-07-05 DIAGNOSIS — Z95828 Presence of other vascular implants and grafts: Secondary | ICD-10-CM

## 2018-07-05 LAB — CBC WITH DIFFERENTIAL/PLATELET
Abs Immature Granulocytes: 0.02 10*3/uL (ref 0.00–0.07)
Basophils Absolute: 0 10*3/uL (ref 0.0–0.1)
Basophils Relative: 0 %
Eosinophils Absolute: 0.1 10*3/uL (ref 0.0–0.5)
Eosinophils Relative: 1 %
HCT: 32.6 % — ABNORMAL LOW (ref 39.0–52.0)
Hemoglobin: 10.6 g/dL — ABNORMAL LOW (ref 13.0–17.0)
Immature Granulocytes: 0 %
Lymphocytes Relative: 38 %
Lymphs Abs: 2.4 10*3/uL (ref 0.7–4.0)
MCH: 34.8 pg — ABNORMAL HIGH (ref 26.0–34.0)
MCHC: 32.5 g/dL (ref 30.0–36.0)
MCV: 106.9 fL — ABNORMAL HIGH (ref 80.0–100.0)
Monocytes Absolute: 0.9 10*3/uL (ref 0.1–1.0)
Monocytes Relative: 14 %
Neutro Abs: 2.9 10*3/uL (ref 1.7–7.7)
Neutrophils Relative %: 47 %
Platelets: 195 10*3/uL (ref 150–400)
RBC: 3.05 MIL/uL — ABNORMAL LOW (ref 4.22–5.81)
RDW: 15.3 % (ref 11.5–15.5)
WBC: 6.4 10*3/uL (ref 4.0–10.5)
nRBC: 0 % (ref 0.0–0.2)

## 2018-07-05 LAB — COMPREHENSIVE METABOLIC PANEL
ALT: 20 U/L (ref 0–44)
AST: 27 U/L (ref 15–41)
Albumin: 3.5 g/dL (ref 3.5–5.0)
Alkaline Phosphatase: 40 U/L (ref 38–126)
Anion gap: 8 (ref 5–15)
BUN: 14 mg/dL (ref 8–23)
CO2: 23 mmol/L (ref 22–32)
Calcium: 8.4 mg/dL — ABNORMAL LOW (ref 8.9–10.3)
Chloride: 106 mmol/L (ref 98–111)
Creatinine, Ser: 0.9 mg/dL (ref 0.61–1.24)
GFR calc Af Amer: >60 mL/min (ref >60)
GFR calc non Af Amer: >60 mL/min (ref >60)
Glucose, Bld: 150 mg/dL — ABNORMAL HIGH (ref 70–99)
Potassium: 3.9 mmol/L (ref 3.5–5.1)
Sodium: 137 mmol/L (ref 135–145)
Total Bilirubin: 1.2 mg/dL (ref 0.3–1.2)
Total Protein: 6.3 g/dL — ABNORMAL LOW (ref 6.5–8.1)

## 2018-07-05 MED ORDER — SODIUM CHLORIDE 0.9 % IV SOLN
1920.0000 mg/m2 | INTRAVENOUS | Status: DC
  Administered 2018-07-05: 3950 mg via INTRAVENOUS
  Filled 2018-07-05: qty 79

## 2018-07-05 MED ORDER — IRINOTECAN HCL CHEMO INJECTION 100 MG/5ML
144.0000 mg/m2 | Freq: Once | INTRAVENOUS | Status: AC
  Administered 2018-07-05: 300 mg via INTRAVENOUS
  Filled 2018-07-05: qty 15

## 2018-07-05 MED ORDER — PALONOSETRON HCL INJECTION 0.25 MG/5ML
INTRAVENOUS | Status: AC
  Filled 2018-07-05: qty 5

## 2018-07-05 MED ORDER — LEUCOVORIN CALCIUM INJECTION 350 MG
700.0000 mg | Freq: Once | INTRAVENOUS | Status: AC
  Administered 2018-07-05: 700 mg via INTRAVENOUS
  Filled 2018-07-05: qty 35

## 2018-07-05 MED ORDER — ATROPINE SULFATE 1 MG/ML IJ SOLN
0.5000 mg | Freq: Once | INTRAMUSCULAR | Status: AC
  Administered 2018-07-05: 0.5 mg via INTRAVENOUS
  Filled 2018-07-05: qty 1

## 2018-07-05 MED ORDER — FLUOROURACIL CHEMO INJECTION 2.5 GM/50ML
320.0000 mg/m2 | Freq: Once | INTRAVENOUS | Status: AC
  Administered 2018-07-05: 650 mg via INTRAVENOUS
  Filled 2018-07-05: qty 13

## 2018-07-05 MED ORDER — SODIUM CHLORIDE 0.9 % IV SOLN
10.0000 mg | Freq: Once | INTRAVENOUS | Status: AC
  Administered 2018-07-05: 10 mg via INTRAVENOUS
  Filled 2018-07-05: qty 10

## 2018-07-05 MED ORDER — PALONOSETRON HCL INJECTION 0.25 MG/5ML
0.2500 mg | Freq: Once | INTRAVENOUS | Status: AC
  Administered 2018-07-05: 0.25 mg via INTRAVENOUS

## 2018-07-05 MED ORDER — SODIUM CHLORIDE 0.9 % IV SOLN
Freq: Once | INTRAVENOUS | Status: AC
  Administered 2018-07-05: 09:00:00 via INTRAVENOUS

## 2018-07-05 MED ORDER — SODIUM CHLORIDE 0.9% FLUSH
10.0000 mL | INTRAVENOUS | Status: DC | PRN
  Administered 2018-07-05: 10 mL
  Filled 2018-07-05: qty 10

## 2018-07-05 MED ORDER — SODIUM CHLORIDE 0.9 % IV SOLN
400.0000 mg | Freq: Once | INTRAVENOUS | Status: AC
  Administered 2018-07-05: 400 mg via INTRAVENOUS
  Filled 2018-07-05: qty 16

## 2018-07-05 MED ORDER — SODIUM CHLORIDE 0.9 % IV SOLN
INTRAVENOUS | Status: DC
  Administered 2018-07-05: 10:00:00 via INTRAVENOUS

## 2018-07-05 NOTE — Patient Instructions (Signed)
[facility] at [facility] Discharge Instructions  Labs drawn from portacath today   Thank you for choosing  Cancer Center at [facility] to provide your oncology and hematology care.  To afford each patient quality time with our provider, please arrive at least 15 minutes before your scheduled appointment time.   If you have a lab appointment with the Cancer Center please come in thru the  Main Entrance and check in at the main information desk  You need to re-schedule your appointment should you arrive 10 or more minutes late.  We strive to give you quality time with our providers, and arriving late affects you and other patients whose appointments are after yours.  Also, if you no show three or more times for appointments you may be dismissed from the clinic at the providers discretion.     Again, thank you for choosing [facility].  Our hope is that these requests will decrease the amount of time that you wait before being seen by our physicians.       _____________________________________________________________  Should you have questions after your visit to [facility], please contact our office at [phone] between the hours of 8:00 a.m. and 4:30 p.m.  Voicemails left after 4:00 p.m. will not be returned until the following business day.  For prescription refill requests, have your pharmacy contact our office and allow 72 hours.    Cancer Center Support Programs:   > Cancer Support Group  2nd Tuesday of the month 1pm-2pm, Journey Room   

## 2018-07-05 NOTE — Assessment & Plan Note (Signed)
1.  Stage IV colon cancer (pT3 pN1 M1) MSI -High: -K-ras mutation negative, BRAF V600E positive, PIK3CA, CDKN2A,TP53 - 12 cycles of FOLFOX from 08/26/2016 through 01/27/2017. -Opdivo from 02/23/2017 through 07/13/2017. -FOLFIRI and bevacizumab started on 07/27/2017 - CT CAP on 05/07/2018 shows similar right pelvic nodule and left abdominal omental metastasis.  No progressive disease.  Similar mild thoracic adenopathy.  Similar nonspecific bilateral pulmonary nodules. - Cycle 24 was on 06/21/2018.  He is continuing to tolerate chemotherapy very well. -We have reviewed his blood work.  He may proceed with next cycle today.  I plan to repeat CT CAP after next cycle. -I will see him back in 2 weeks for follow-up.  2.  Peripheral neuropathy: -He has tingling in the hands and feet which is constant. -We will continue gabapentin 600 mg twice daily.  3.  Diarrhea: - He has 2-3 watery bowel movements per day.  He is taking 1 tablet each of Imodium and Lomotil twice daily. -He is already receiving atropine 0.5 mg in the premeds.  4.  Weight loss: - He lost about 5 pounds since last visit. -I have recommended to drink 1 can of Ensure per day.  He is eating 3 meals per day and has good appetite.   

## 2018-07-05 NOTE — Patient Instructions (Addendum)
[place] at [facility] Discharge Instructions  You were seen today by Dr. Delton Coombes. He went over your recent lab results. He would like you to start drinking 1 boost a day to help with nutrition. He will see you back in 2 weeks for labs and follow up.   Thank you for choosing [place] at [facility] to provide your oncology and hematology care.  To afford each patient quality time with our provider, please arrive at least 15 minutes before your scheduled appointment time.   If you have a lab appointment with the [place] please come in thru the  Main Entrance and check in at the main information desk  You need to re-schedule your appointment should you arrive 10 or more minutes late.  We strive to give you quality time with our providers, and arriving late affects you and other patients whose appointments are after yours.  Also, if you no show three or more times for appointments you may be dismissed from the clinic at the providers discretion.     Again, thank you for choosing [facility].  Our hope is that these requests will decrease the amount of time that you wait before being seen by our physicians.       _____________________________________________________________  Should you have questions after your visit to [facility], please contact our office at [phone] between the hours of 8:00 a.m. and 4:30 p.m.  Voicemails left after 4:00 p.m. will not be returned until the following business day.  For prescription refill requests, have your pharmacy contact our office and allow 72 hours.    Cancer Center Support Programs:   > Cancer Support Group  2nd Tuesday of the month 1pm-2pm, Journey Room

## 2018-07-05 NOTE — Progress Notes (Signed)
Labs reviewed during office visit with MD. Proceed with treatment today.   Continuous 5FU pump connected today per MD  Treatment given per orders. Patient tolerated it well without problems. Vitals stable and discharged home from clinic ambulatory. Follow up as scheduled.

## 2018-07-05 NOTE — Patient Instructions (Signed)
[facility] Discharge Instructions for Patients Receiving Chemotherapy  Today you received the following chemotherapy agents   To help prevent nausea and vomiting after your treatment, we encourage you to take your nausea medication   If you develop nausea and vomiting that is not controlled by your nausea medication, call the clinic.   BELOW ARE SYMPTOMS THAT SHOULD BE REPORTED IMMEDIATELY:  *FEVER GREATER THAN 100.5 F  *CHILLS WITH OR WITHOUT FEVER  NAUSEA AND VOMITING THAT IS NOT CONTROLLED WITH YOUR NAUSEA MEDICATION  *UNUSUAL SHORTNESS OF BREATH  *UNUSUAL BRUISING OR BLEEDING  TENDERNESS IN MOUTH AND THROAT WITH OR WITHOUT PRESENCE OF ULCERS  *URINARY PROBLEMS  *BOWEL PROBLEMS  UNUSUAL RASH Items with * indicate a potential emergency and should be followed up as soon as possible.  Feel free to call the clinic should you have any questions or concerns. The clinic phone number is [phone].  Please show the CHEMO ALERT CARD at check-in to the Emergency Department and triage nurse.   

## 2018-07-05 NOTE — Progress Notes (Signed)
[facility] [address], [place] [address]   CLINIC:  Medical Oncology/Hematology  PCP:  Tobe Sos, MD [address] [place] [address] [phone]   REASON FOR VISIT:  Follow-up for metastatic colon cancer.   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[place])    07/27/2017 -  Chemotherapy    The patient had palonosetron (ALOXI) injection 0.25 mg, 0.25 mg, Intravenous,  Once, 25 of 26 cycles Administration: 0.25 mg (07/27/2017), 0.25 mg (08/10/2017), 0.25 mg (08/24/2017), 0.25 mg (09/07/2017), 0.25 mg (09/21/2017), 0.25 mg (10/05/2017), 0.25 mg (10/19/2017), 0.25 mg (11/02/2017), 0.25 mg (11/16/2017), 0.25 mg (11/30/2017), 0.25 mg (12/14/2017), 0.25 mg (12/28/2017), 0.25 mg (01/12/2018), 0.25 mg (01/26/2018), 0.25 mg (02/09/2018), 0.25 mg (02/24/2018), 0.25 mg (03/10/2018), 0.25 mg (03/29/2018), 0.25 mg (04/12/2018), 0.25 mg (04/26/2018), 0.25 mg (05/10/2018), 0.25 mg (05/24/2018), 0.25 mg (06/07/2018), 0.25 mg (06/21/2018), 0.25 mg (07/05/2018) pegfilgrastim-cbqv (UDENYCA) injection 6 mg, 6 mg, Subcutaneous, Once, 7 of 7 cycles Administration: 6 mg (10/07/2017), 6 mg (10/21/2017), 6 mg (11/04/2017), 6 mg (11/18/2017), 6 mg (12/02/2017), 6 mg (12/16/2017), 6 mg (12/30/2017) bevacizumab (AVASTIN) 450 mg in sodium chloride 0.9 % 100 mL chemo infusion, 5 mg/kg = 450 mg, Intravenous,  Once, 25 of 26 cycles Dose modification: 4 mg/kg (80 % of original dose 5 mg/kg, Cycle 5, Reason: Provider Judgment), 5 mg/kg (original dose 5 mg/kg, Cycle 16, Reason: Other (see comments)) Administration: 450 mg (07/27/2017), 450 mg (08/10/2017), 450 mg (08/24/2017), 450 mg (09/07/2017), 450 mg (09/21/2017), 450 mg (10/05/2017), 450 mg (10/19/2017), 450 mg (11/02/2017), 450 mg (11/16/2017), 450 mg (11/30/2017), 450 mg (12/14/2017), 450 mg (12/28/2017), 400 mg (01/12/2018), 400 mg (01/26/2018), 400 mg (02/09/2018), 400 mg (02/24/2018),  400 mg (03/10/2018), 400 mg (03/29/2018), 400 mg (04/12/2018), 400 mg (04/26/2018), 400 mg (05/10/2018), 400 mg (05/24/2018), 400 mg (06/07/2018), 400 mg (06/21/2018), 400 mg (07/05/2018) irinotecan (CAMPTOSAR) 400 mg in dextrose 5 % 500 mL chemo infusion, 180 mg/m2 = 400 mg, Intravenous,  Once, 25 of 26 cycles Dose modification: 144 mg/m2 (80 % of original dose 180 mg/m2, Cycle 5, Reason: Provider Judgment) Administration: 400 mg (07/27/2017), 400 mg (08/10/2017), 400 mg (08/24/2017), 400 mg (09/07/2017), 320 mg (09/21/2017), 320 mg (10/05/2017), 320 mg (10/19/2017), 320 mg (11/02/2017), 320 mg (11/16/2017), 320 mg (11/30/2017), 320 mg (12/14/2017), 320 mg (12/28/2017), 320 mg (01/12/2018), 320 mg (01/26/2018), 320 mg (02/09/2018), 300 mg (02/24/2018), 300 mg (03/10/2018), 300 mg (03/29/2018), 300 mg (04/12/2018), 300 mg (04/26/2018), 300 mg (05/10/2018), 300 mg (05/24/2018), 300 mg (06/07/2018), 300 mg (06/21/2018), 300 mg (07/05/2018) leucovorin 800 mg in dextrose 5 % 250 mL infusion, 872 mg, Intravenous,  Once, 25 of 26 cycles Dose modification: 320 mg/m2 (80 % of original dose 400 mg/m2, Cycle 5, Reason: Provider Judgment) Administration: 800 mg (07/27/2017), 800 mg (08/10/2017), 800 mg (08/24/2017), 800 mg (09/07/2017), 700 mg (09/21/2017), 700 mg (10/05/2017), 700 mg (10/19/2017), 700 mg (11/02/2017), 700 mg (11/16/2017), 700 mg (11/30/2017), 700 mg (12/14/2017), 700 mg (12/28/2017), 700 mg (01/12/2018), 700 mg (01/26/2018), 700 mg (02/09/2018), 700 mg (02/24/2018), 700 mg (03/10/2018), 700 mg (03/29/2018), 700 mg (04/12/2018), 700 mg (04/26/2018), 700 mg (05/10/2018), 700 mg (05/24/2018), 700 mg (06/07/2018), 700 mg (06/21/2018), 700 mg (07/05/2018) fluorouracil (ADRUCIL) chemo injection 850 mg, 400 mg/m2 = 850 mg, Intravenous,  Once, 25 of 26 cycles Dose modification: 320 mg/m2 (80 % of original dose 400 mg/m2, Cycle 5, Reason: Provider Judgment)  Administration: 850 mg (07/27/2017), 850 mg (08/10/2017), 850 mg (08/24/2017), 850 mg (09/07/2017), 700 mg (09/21/2017), 700 mg  (10/05/2017), 700 mg (10/19/2017), 700 mg (11/02/2017), 700 mg (11/16/2017), 700 mg (11/30/2017), 700 mg (12/14/2017), 700 mg (12/28/2017), 700 mg (01/12/2018), 700 mg (01/26/2018), 700 mg (02/09/2018), 650 mg (02/24/2018), 650 mg (03/10/2018), 650 mg (03/29/2018), 650 mg (04/12/2018), 650 mg (04/26/2018), 650 mg (05/10/2018), 650 mg (05/24/2018), 650 mg (06/07/2018), 650 mg (06/21/2018), 650 mg (07/05/2018) fluorouracil (ADRUCIL) 5,250 mg in sodium chloride 0.9 % 145 mL chemo infusion, 2,400 mg/m2 = 5,250 mg, Intravenous, 1 Day/Dose, 25 of 26 cycles Dose modification: 1,920 mg/m2 (80 % of original dose 2,400 mg/m2, Cycle 5, Reason: Provider Judgment) Administration: 5,250 mg (07/27/2017), 5,250 mg (08/10/2017), 5,250 mg (08/24/2017), 5,250 mg (09/07/2017), 4,200 mg (09/21/2017), 4,200 mg (10/05/2017), 4,200 mg (10/19/2017), 4,200 mg (11/02/2017), 4,200 mg (11/16/2017), 4,200 mg (11/30/2017), 4,200 mg (12/14/2017), 4,200 mg (12/28/2017), 4,200 mg (01/12/2018), 4,200 mg (01/26/2018), 4,200 mg (02/09/2018), 3,950 mg (02/24/2018), 3,950 mg (03/10/2018), 3,950 mg (03/29/2018), 3,950 mg (04/12/2018), 3,950 mg (04/26/2018), 3,950 mg (05/10/2018), 3,950 mg (05/24/2018), 3,950 mg (06/07/2018), 3,950 mg (06/21/2018), 3,950 mg (07/05/2018)  for chemotherapy treatment.       CANCER STAGING: Cancer Staging Malignant neoplasm of transverse colon [facility]) Staging form: Colon and Rectum, AJCC 8th Edition - Clinical: cT3, cN1a, cM1 - Signed by Twana First, MD on 03/09/2017 - Pathologic: No stage assigned - Unsigned    INTERVAL HISTORY:  Mr. Waterson 85 y.o. male returns for routine follow-up and consideration for next cycle of chemotherapy. He is here today alone. He states that he had diarrhea until this past Saturday. He states that he continues taking the imodium and lomotil as directed. He states that he eats 3 meals a day. Denies any nausea, vomiting, or diarrhea. Denies any new pains. Had not noticed any recent bleeding such as epistaxis, hematuria or  hematochezia. Denies recent chest pain on exertion, shortness of breath on minimal exertion, pre-syncopal episodes, or palpitations. Denies any numbness or tingling in hands or feet. Denies any recent fevers, infections, or recent hospitalizations. Patient reports appetite at 100% and energy level at 75%.   REVIEW OF SYSTEMS:  Review of Systems  Gastrointestinal: Positive for diarrhea.  Hematological: Bruises/bleeds easily.     PAST MEDICAL/SURGICAL HISTORY:  Past Medical History:  Diagnosis Date   Chronic pulmonary embolism ([place]) 04/4095   compliction of the valve replacement surgery   COPD (chronic obstructive pulmonary disease) ([place])    Diabetes ([place]) 04/22/2016   H/O aortic valve replacement 10/2008   Hypercholesteremia    Hypertension    Pulmonary emboli ([place]) 10/2008   Sleep apnea    Past Surgical History:  Procedure Laterality Date   AORTIC VALVE REPLACEMENT     2010   CARDIAC CATHETERIZATION  2010   COLONOSCOPY N/A 06/05/2016   Procedure: COLONOSCOPY;  Surgeon: Rogene Houston, MD;  Location: [facility] ENDO SUITE;  Service: Endoscopy;  Laterality: N/A;   PARTIAL COLECTOMY N/A 07/11/2016   Procedure: PARTIAL COLECTOMY;  Surgeon: Aviva Signs, MD;  Location: [facility] ORS;  Service: General;  Laterality: N/A;   PORTACATH PLACEMENT N/A 08/13/2016   Procedure: INSERTION PORT-A-CATH LEFT SUBCLAVIAN;  Surgeon: Aviva Signs, MD;  Location: [facility] ORS;  Service: General;  Laterality: N/A;   REPLACEMENT TOTAL KNEE     1996   spenectomy     from trauma     SOCIAL HISTORY:  Social History   Socioeconomic History   Marital status: Widowed    Spouse name:  Not on file   Number of children: Not on file   Years of education: Not on file   Highest education level: Not on file  Occupational History   Not on file  Social Needs   Financial resource strain: Not on file   Food insecurity:    Worry: Not on file    Inability: Not on file   Transportation needs:    Medical:  Not on file    Non-medical: Not on file  Tobacco Use   Smoking status: Former Smoker    Years: 15.00    Types: Cigarettes    Last attempt to quit: 1964    Years since quitting: 56.3   Smokeless tobacco: Never Used  Substance and Sexual Activity   Alcohol use: No   Drug use: No   Sexual activity: Yes  Lifestyle   Physical activity:    Days per week: Not on file    Minutes per session: Not on file   Stress: Not on file  Relationships   Social connections:    Talks on phone: Not on file    Gets together: Not on file    Attends religious service: Not on file    Active member of club or organization: Not on file    Attends meetings of clubs or organizations: Not on file    Relationship status: Not on file   Intimate partner violence:    Fear of current or ex partner: Not on file    Emotionally abused: Not on file    Physically abused: Not on file    Forced sexual activity: Not on file  Other Topics Concern   Not on file  Social History Narrative   Not on file    FAMILY HISTORY:  Family History  Problem Relation Age of Onset   Colon cancer Neg Hx     CURRENT MEDICATIONS:  Outpatient Encounter Medications as of 07/05/2018  Medication Sig Note   atorvastatin (LIPITOR) 10 MG tablet Take 10 mg by mouth at bedtime.     Cholecalciferol (VITAMIN D3) 5000 units CAPS Take 5,000 Units by mouth daily.     diphenoxylate-atropine (LOMOTIL) 2.5-0.025 MG tablet Take 1 tablet by mouth 4 (four) times daily as needed for diarrhea or loose stools.    gabapentin (NEURONTIN) 600 MG tablet Take 1 tablet (600 mg total) by mouth 2 (two) times daily.    lisinopril (PRINIVIL,ZESTRIL) 10 MG tablet Take 10 mg by mouth daily.    metoprolol (LOPRESSOR) 50 MG tablet Take 50 mg by mouth 2 (two) times daily.    Multiple Vitamins-Minerals (CENTRUM SILVER 50+MEN PO) Take 1 tablet by mouth daily.    RELION GLUCOSE TEST STRIPS test strip USE 1 STRIP TO CHECK GLUCOSE ONCE DAILY     vitamin B-12 (CYANOCOBALAMIN) 1000 MCG tablet Take 1,000 mcg by mouth daily.     [DISCONTINUED] prochlorperazine (COMPAZINE) 10 MG tablet Take 1 tablet (10 mg total) by mouth every 6 (six) hours as needed (Nausea or vomiting). 08/05/2016: New Med   Facility-Administered Encounter Medications as of 07/05/2018  Medication   sodium chloride flush (NS) 0.9 % injection 10 mL    ALLERGIES:  Allergies  Allergen Reactions   Amoxicillin Hives    Has patient had a PCN reaction causing immediate rash, facial/tongue/throat swelling, SOB or lightheadedness with hypotension: No Has patient had a PCN reaction causing severe rash involving mucus membranes or skin necrosis: Yes Has patient had a PCN reaction that required hospitalization No Has patient  had a PCN reaction occurring within the last 10 years: No If all of the above answers are "NO", then may proceed with Cephalosporin use.    Tamsulosin Hcl     Cant sleep, confusion      PHYSICAL EXAM:  ECOG Performance status: 1  Vitals:   07/05/18 0800  BP: (!) 102/48  Pulse: 79  Resp: 18  Temp: 98.1 F (36.7 C)  SpO2: 98%   Filed Weights   07/05/18 0800  Weight: 174 lb 12.8 oz (79.3 kg)    Physical Exam Vitals signs reviewed.  Constitutional:      Appearance: Normal appearance.  Cardiovascular:     Rate and Rhythm: Normal rate and regular rhythm.     Heart sounds: Normal heart sounds.  Pulmonary:     Effort: Pulmonary effort is normal.     Breath sounds: Normal breath sounds.  Abdominal:     General: There is no distension.     Palpations: Abdomen is soft.     Tenderness: There is no abdominal tenderness.  Musculoskeletal:        General: No swelling.  Skin:    General: Skin is warm.  Neurological:     General: No focal deficit present.     Mental Status: He is alert and oriented to person, place, and time.  Psychiatric:        Mood and Affect: Mood normal.        Behavior: Behavior normal.      LABORATORY DATA:    I have reviewed the labs as listed.  CBC    Component Value Date/Time   WBC 6.4 07/05/2018 0749   RBC 3.05 (L) 07/05/2018 0749   HGB 10.6 (L) 07/05/2018 0749   HCT 32.6 (L) 07/05/2018 0749   PLT 195 07/05/2018 0749   MCV 106.9 (H) 07/05/2018 0749   MCH 34.8 (H) 07/05/2018 0749   MCHC 32.5 07/05/2018 0749   RDW 15.3 07/05/2018 0749   LYMPHSABS 2.4 07/05/2018 0749   MONOABS 0.9 07/05/2018 0749   EOSABS 0.1 07/05/2018 0749   BASOSABS 0.0 07/05/2018 0749   CMP Latest Ref Rng & Units 07/05/2018 06/21/2018 06/07/2018  Glucose 70 - 99 mg/dL 150(H) 152(H) 142(H)  BUN 8 - 23 mg/dL 14 24(H) 17  Creatinine 0.61 - 1.24 mg/dL 0.90 1.28(H) 1.01  Sodium 135 - 145 mmol/L 137 139 139  Potassium 3.5 - 5.1 mmol/L 3.9 4.1 4.5  Chloride 98 - 111 mmol/L 106 107 106  CO2 22 - 32 mmol/L '23 22 24  ' Calcium 8.9 - 10.3 mg/dL 8.4(L) 9.1 9.1  Total Protein 6.5 - 8.1 g/dL 6.3(L) 7.1 6.6  Total Bilirubin 0.3 - 1.2 mg/dL 1.2 0.9 0.7  Alkaline Phos 38 - 126 U/L 40 47 50  AST 15 - 41 U/L '27 29 23  ' ALT 0 - 44 U/L '20 20 22       ' DIAGNOSTIC IMAGING:  I have independently reviewed the scans and discussed with the patient.   I have reviewed Venita Lick LPN's note and agree with the documentation.  I personally performed a face-to-face visit, made revisions and my assessment and plan is as follows.    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  Stage IV colon cancer (pT3 pN1 M1) MSI -High: -K-ras mutation negative, BRAF V600E positive, PIK3CA, CDKN2A,TP53 - 12 cycles of FOLFOX from 08/26/2016 through 01/27/2017. -Opdivo from 02/23/2017 through 07/13/2017. -FOLFIRI and bevacizumab started on 07/27/2017 - CT CAP on 05/07/2018  shows similar right pelvic nodule and left abdominal omental metastasis.  No progressive disease.  Similar mild thoracic adenopathy.  Similar nonspecific bilateral pulmonary nodules. - Cycle 24 was on 06/21/2018.  He is continuing to tolerate chemotherapy very  well. -We have reviewed his blood work.  He may proceed with next cycle today.  I plan to repeat CT CAP after next cycle. -I will see him back in 2 weeks for follow-up.  2.  Peripheral neuropathy: -He has tingling in the hands and feet which is constant. -We will continue gabapentin 600 mg twice daily.  3.  Diarrhea: - He has 2-3 watery bowel movements per day.  He is taking 1 tablet each of Imodium and Lomotil twice daily. -He is already receiving atropine 0.5 mg in the premeds.  4.  Weight loss: - He lost about 5 pounds since last visit. -I have recommended to drink 1 can of Ensure per day.  He is eating 3 meals per day and has good appetite.        Orders placed this encounter:  Orders Placed This Encounter  Procedures   CBC with Differential/Platelet   Comprehensive metabolic panel   CEA      Derek Jack, MD [place] [phone]

## 2018-07-07 ENCOUNTER — Encounter (HOSPITAL_COMMUNITY): Payer: Self-pay

## 2018-07-07 ENCOUNTER — Other Ambulatory Visit: Payer: Self-pay

## 2018-07-07 ENCOUNTER — Inpatient Hospital Stay (HOSPITAL_COMMUNITY): Payer: Medicare Other

## 2018-07-07 VITALS — BP 130/68 | HR 68 | Resp 18

## 2018-07-07 DIAGNOSIS — C184 Malignant neoplasm of transverse colon: Secondary | ICD-10-CM

## 2018-07-07 DIAGNOSIS — Z95828 Presence of other vascular implants and grafts: Secondary | ICD-10-CM

## 2018-07-07 DIAGNOSIS — Z5111 Encounter for antineoplastic chemotherapy: Secondary | ICD-10-CM | POA: Diagnosis not present

## 2018-07-07 MED ORDER — HEPARIN SOD (PORK) LOCK FLUSH 100 UNIT/ML IV SOLN
500.0000 [IU] | Freq: Once | INTRAVENOUS | Status: AC | PRN
  Administered 2018-07-07: 500 [IU]

## 2018-07-07 MED ORDER — SODIUM CHLORIDE 0.9% FLUSH
10.0000 mL | INTRAVENOUS | Status: DC | PRN
  Administered 2018-07-07: 10 mL
  Filled 2018-07-07: qty 10

## 2018-07-07 MED ORDER — SODIUM CHLORIDE 0.9 % IV SOLN
Freq: Once | INTRAVENOUS | Status: AC
  Administered 2018-07-07: 13:00:00 via INTRAVENOUS

## 2018-07-07 NOTE — Progress Notes (Signed)
Patient for chemotherapy pump disconnect.  Patient stated he had nausea and vomitng x 1 this morning.  Noted weakness and unsteady with walking.  Reviewed vital signs and patients symptoms with Dr. Delton Coombes with verbal order NACL 550ml over one hour.    Patient tolerated hydration without complaints.  Instructed the patient to call if he had more nausea or vomiting to be assessed for possible hydration and change in nausea medications.  Patient verbalized understanding.  VSS with discharge and left by wheelchair with no s/s of distress noted.

## 2018-07-19 ENCOUNTER — Inpatient Hospital Stay (HOSPITAL_BASED_OUTPATIENT_CLINIC_OR_DEPARTMENT_OTHER): Payer: Medicare Other | Admitting: Hematology

## 2018-07-19 ENCOUNTER — Encounter (HOSPITAL_COMMUNITY): Payer: Self-pay | Admitting: Hematology

## 2018-07-19 ENCOUNTER — Inpatient Hospital Stay (HOSPITAL_COMMUNITY): Payer: Medicare Other

## 2018-07-19 ENCOUNTER — Other Ambulatory Visit: Payer: Self-pay

## 2018-07-19 VITALS — BP 149/77 | HR 75 | Temp 98.1°F | Resp 18 | Wt 176.0 lb

## 2018-07-19 DIAGNOSIS — Z95828 Presence of other vascular implants and grafts: Secondary | ICD-10-CM

## 2018-07-19 DIAGNOSIS — C184 Malignant neoplasm of transverse colon: Secondary | ICD-10-CM

## 2018-07-19 DIAGNOSIS — R197 Diarrhea, unspecified: Secondary | ICD-10-CM

## 2018-07-19 DIAGNOSIS — C786 Secondary malignant neoplasm of retroperitoneum and peritoneum: Secondary | ICD-10-CM

## 2018-07-19 DIAGNOSIS — Z5111 Encounter for antineoplastic chemotherapy: Secondary | ICD-10-CM | POA: Diagnosis not present

## 2018-07-19 DIAGNOSIS — G629 Polyneuropathy, unspecified: Secondary | ICD-10-CM

## 2018-07-19 DIAGNOSIS — R634 Abnormal weight loss: Secondary | ICD-10-CM

## 2018-07-19 DIAGNOSIS — Z79899 Other long term (current) drug therapy: Secondary | ICD-10-CM

## 2018-07-19 LAB — COMPREHENSIVE METABOLIC PANEL
ALT: 18 U/L (ref 0–44)
AST: 24 U/L (ref 15–41)
Albumin: 3.5 g/dL (ref 3.5–5.0)
Alkaline Phosphatase: 41 U/L (ref 38–126)
Anion gap: 8 (ref 5–15)
BUN: 13 mg/dL (ref 8–23)
CO2: 25 mmol/L (ref 22–32)
Calcium: 8.7 mg/dL — ABNORMAL LOW (ref 8.9–10.3)
Chloride: 108 mmol/L (ref 98–111)
Creatinine, Ser: 0.84 mg/dL (ref 0.61–1.24)
GFR calc Af Amer: >60 mL/min (ref >60)
GFR calc non Af Amer: >60 mL/min (ref >60)
Glucose, Bld: 129 mg/dL — ABNORMAL HIGH (ref 70–99)
Potassium: 4.1 mmol/L (ref 3.5–5.1)
Sodium: 141 mmol/L (ref 135–145)
Total Bilirubin: 0.7 mg/dL (ref 0.3–1.2)
Total Protein: 6.2 g/dL — ABNORMAL LOW (ref 6.5–8.1)

## 2018-07-19 LAB — CBC WITH DIFFERENTIAL/PLATELET
Abs Immature Granulocytes: 0.01 10*3/uL (ref 0.00–0.07)
Basophils Absolute: 0 10*3/uL (ref 0.0–0.1)
Basophils Relative: 0 %
Eosinophils Absolute: 0.1 10*3/uL (ref 0.0–0.5)
Eosinophils Relative: 2 %
HCT: 33.3 % — ABNORMAL LOW (ref 39.0–52.0)
Hemoglobin: 10.5 g/dL — ABNORMAL LOW (ref 13.0–17.0)
Immature Granulocytes: 0 %
Lymphocytes Relative: 40 %
Lymphs Abs: 2.5 10*3/uL (ref 0.7–4.0)
MCH: 34.3 pg — ABNORMAL HIGH (ref 26.0–34.0)
MCHC: 31.5 g/dL (ref 30.0–36.0)
MCV: 108.8 fL — ABNORMAL HIGH (ref 80.0–100.0)
Monocytes Absolute: 1.2 10*3/uL — ABNORMAL HIGH (ref 0.1–1.0)
Monocytes Relative: 19 %
Neutro Abs: 2.5 10*3/uL (ref 1.7–7.7)
Neutrophils Relative %: 39 %
Platelets: 203 10*3/uL (ref 150–400)
RBC: 3.06 MIL/uL — ABNORMAL LOW (ref 4.22–5.81)
RDW: 15.5 % (ref 11.5–15.5)
WBC: 6.3 10*3/uL (ref 4.0–10.5)
nRBC: 0 % (ref 0.0–0.2)

## 2018-07-19 MED ORDER — SODIUM CHLORIDE 0.9 % IV SOLN
144.0000 mg/m2 | Freq: Once | INTRAVENOUS | Status: AC
  Administered 2018-07-19: 11:00:00 300 mg via INTRAVENOUS
  Filled 2018-07-19: qty 15

## 2018-07-19 MED ORDER — ATROPINE SULFATE 1 MG/ML IJ SOLN
0.5000 mg | Freq: Once | INTRAMUSCULAR | Status: AC
  Administered 2018-07-19: 09:00:00 0.5 mg via INTRAVENOUS
  Filled 2018-07-19: qty 1

## 2018-07-19 MED ORDER — SODIUM CHLORIDE 0.9 % IV SOLN
1920.0000 mg/m2 | INTRAVENOUS | Status: DC
  Administered 2018-07-19: 13:00:00 3950 mg via INTRAVENOUS
  Filled 2018-07-19: qty 79

## 2018-07-19 MED ORDER — SODIUM CHLORIDE 0.9 % IV SOLN
700.0000 mg | Freq: Once | INTRAVENOUS | Status: AC
  Administered 2018-07-19: 11:00:00 700 mg via INTRAVENOUS
  Filled 2018-07-19: qty 35

## 2018-07-19 MED ORDER — SODIUM CHLORIDE 0.9 % IV SOLN
10.0000 mg | Freq: Once | INTRAVENOUS | Status: AC
  Administered 2018-07-19: 10 mg via INTRAVENOUS
  Filled 2018-07-19: qty 10

## 2018-07-19 MED ORDER — SODIUM CHLORIDE 0.9 % IV SOLN
Freq: Once | INTRAVENOUS | Status: AC
  Administered 2018-07-19: 09:00:00 via INTRAVENOUS

## 2018-07-19 MED ORDER — SODIUM CHLORIDE 0.9 % IV SOLN
400.0000 mg | Freq: Once | INTRAVENOUS | Status: AC
  Administered 2018-07-19: 10:00:00 400 mg via INTRAVENOUS
  Filled 2018-07-19: qty 16

## 2018-07-19 MED ORDER — SODIUM CHLORIDE 0.9 % IV SOLN
Freq: Once | INTRAVENOUS | Status: AC
  Administered 2018-07-19: 11:00:00 via INTRAVENOUS

## 2018-07-19 MED ORDER — FLUOROURACIL CHEMO INJECTION 2.5 GM/50ML
320.0000 mg/m2 | Freq: Once | INTRAVENOUS | Status: AC
  Administered 2018-07-19: 13:00:00 650 mg via INTRAVENOUS
  Filled 2018-07-19: qty 13

## 2018-07-19 MED ORDER — PALONOSETRON HCL INJECTION 0.25 MG/5ML
0.2500 mg | Freq: Once | INTRAVENOUS | Status: AC
  Administered 2018-07-19: 0.25 mg via INTRAVENOUS
  Filled 2018-07-19: qty 5

## 2018-07-19 NOTE — Patient Instructions (Signed)
[facility] at [facility] Discharge Instructions  You were seen today by Dr. Katragadda. He went over your recent lab results. He will see you back in 2 weeks for labs and follow up.   Thank you for choosing [facility] at [facility] to provide your oncology and hematology care.  To afford each patient quality time with our provider, please arrive at least 15 minutes before your scheduled appointment time.   If you have a lab appointment with the Cancer Center please come in thru the  Main Entrance and check in at the main information desk  You need to re-schedule your appointment should you arrive 10 or more minutes late.  We strive to give you quality time with our providers, and arriving late affects you and other patients whose appointments are after yours.  Also, if you no show three or more times for appointments you may be dismissed from the clinic at the providers discretion.     Again, thank you for choosing [facility].  Our hope is that these requests will decrease the amount of time that you wait before being seen by our physicians.       _____________________________________________________________  Should you have questions after your visit to [facility], please contact our office at [phone] between the hours of 8:00 a.m. and 4:30 p.m.  Voicemails left after 4:00 p.m. will not be returned until the following business day.  For prescription refill requests, have your pharmacy contact our office and allow 72 hours.    Cancer Center Support Programs:   > Cancer Support Group  2nd Tuesday of the month 1pm-2pm, Journey Room    

## 2018-07-19 NOTE — Progress Notes (Signed)
Pt presents today for treatment and Dr. Appointment with Dr. Delton Coombes. VSS. No complaints of any changes since the last visit.    Treatment given today per MD orders. Tolerated infusion without adverse affects. Vital signs stable. No complaints at this time. Discharged from clinic ambulatory. 5 FU pump is infusing per protocol. F/U with [facility] as scheduled.

## 2018-07-19 NOTE — Patient Instructions (Signed)
[facility] Discharge Instructions for Patients Receiving Chemotherapy  Today you received the following chemotherapy agents   To help prevent nausea and vomiting after your treatment, we encourage you to take your nausea medication   If you develop nausea and vomiting that is not controlled by your nausea medication, call the clinic.   BELOW ARE SYMPTOMS THAT SHOULD BE REPORTED IMMEDIATELY:  *FEVER GREATER THAN 100.5 F  *CHILLS WITH OR WITHOUT FEVER  NAUSEA AND VOMITING THAT IS NOT CONTROLLED WITH YOUR NAUSEA MEDICATION  *UNUSUAL SHORTNESS OF BREATH  *UNUSUAL BRUISING OR BLEEDING  TENDERNESS IN MOUTH AND THROAT WITH OR WITHOUT PRESENCE OF ULCERS  *URINARY PROBLEMS  *BOWEL PROBLEMS  UNUSUAL RASH Items with * indicate a potential emergency and should be followed up as soon as possible.  Feel free to call the clinic should you have any questions or concerns. The clinic phone number is [phone].  Please show the CHEMO ALERT CARD at check-in to the Emergency Department and triage nurse.   

## 2018-07-19 NOTE — Assessment & Plan Note (Signed)
1.  Stage IV colon cancer (pT3 pN1 M1) MSI -High: -K-ras mutation negative, BRAF V600E positive, PIK3CA, CDKN2A,TP53 - 12 cycles of FOLFOX from 08/26/2016 through 01/27/2017. -Opdivo from 02/23/2017 through 07/13/2017. -FOLFIRI and bevacizumab started on 07/27/2017 - CT CAP on 05/07/2018 shows similar right pelvic nodule and left abdominal omental metastasis.  No progressive disease.  Similar mild thoracic adenopathy.  Similar nonspecific bilateral pulmonary nodules. - Cycle 25 was on 07/05/2018. -He did not experience any bleeding episodes.  Denies any major side effects although he had 1 day of vomiting after last cycle. -I have reviewed his blood work today.  He may proceed with chemotherapy without any dose modifications. -I will see him back in 2 weeks for follow-up.  I plan to repeat a CT CAP prior to next visit along with a CEA level.   2.  Peripheral neuropathy: -He has tingling in the hands and feet which is constant.  He will continue gabapentin 600 mg twice daily.  3.  Diarrhea: -He will continue Imodium and Lomotil twice daily.  He has 2-3 watery bowel movements per day.   4.  Weight loss: - He lost about 5 pounds since last visit. -I have recommended to drink 1 can of Ensure per day.  He is eating 3 meals per day and has good appetite.

## 2018-07-19 NOTE — Progress Notes (Signed)
[facility] [address] [place] [address]   CLINIC:  Medical Oncology/Hematology  PCP:  Tobe Sos, MD [address] [place] [address] [phone]   REASON FOR VISIT:  Follow-up for metastatic colon cancer.   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[place])    07/27/2017 -  Chemotherapy    The patient had palonosetron (ALOXI) injection 0.25 mg, 0.25 mg, Intravenous,  Once, 26 of 30 cycles Administration: 0.25 mg (07/27/2017), 0.25 mg (08/10/2017), 0.25 mg (08/24/2017), 0.25 mg (09/07/2017), 0.25 mg (09/21/2017), 0.25 mg (10/05/2017), 0.25 mg (10/19/2017), 0.25 mg (11/02/2017), 0.25 mg (11/16/2017), 0.25 mg (11/30/2017), 0.25 mg (12/14/2017), 0.25 mg (12/28/2017), 0.25 mg (01/12/2018), 0.25 mg (01/26/2018), 0.25 mg (02/09/2018), 0.25 mg (02/24/2018), 0.25 mg (03/10/2018), 0.25 mg (03/29/2018), 0.25 mg (04/12/2018), 0.25 mg (04/26/2018), 0.25 mg (05/10/2018), 0.25 mg (05/24/2018), 0.25 mg (06/07/2018), 0.25 mg (06/21/2018), 0.25 mg (07/05/2018), 0.25 mg (07/19/2018) pegfilgrastim-cbqv (UDENYCA) injection 6 mg, 6 mg, Subcutaneous, Once, 7 of 7 cycles Administration: 6 mg (10/07/2017), 6 mg (10/21/2017), 6 mg (11/04/2017), 6 mg (11/18/2017), 6 mg (12/02/2017), 6 mg (12/16/2017), 6 mg (12/30/2017) bevacizumab (AVASTIN) 450 mg in sodium chloride 0.9 % 100 mL chemo infusion, 5 mg/kg = 450 mg, Intravenous,  Once, 26 of 30 cycles Dose modification: 4 mg/kg (80 % of original dose 5 mg/kg, Cycle 5, Reason: Provider Judgment), 5 mg/kg (original dose 5 mg/kg, Cycle 16, Reason: Other (see comments)) Administration: 450 mg (07/27/2017), 450 mg (08/10/2017), 450 mg (08/24/2017), 450 mg (09/07/2017), 450 mg (09/21/2017), 450 mg (10/05/2017), 450 mg (10/19/2017), 450 mg (11/02/2017), 450 mg (11/16/2017), 450 mg (11/30/2017), 450 mg (12/14/2017), 450 mg (12/28/2017), 400 mg (01/12/2018), 400 mg (01/26/2018), 400 mg  (02/09/2018), 400 mg (02/24/2018), 400 mg (03/10/2018), 400 mg (03/29/2018), 400 mg (04/12/2018), 400 mg (04/26/2018), 400 mg (05/10/2018), 400 mg (05/24/2018), 400 mg (06/07/2018), 400 mg (06/21/2018), 400 mg (07/05/2018), 400 mg (07/19/2018) irinotecan (CAMPTOSAR) 400 mg in dextrose 5 % 500 mL chemo infusion, 180 mg/m2 = 400 mg, Intravenous,  Once, 26 of 30 cycles Dose modification: 144 mg/m2 (80 % of original dose 180 mg/m2, Cycle 5, Reason: Provider Judgment) Administration: 400 mg (07/27/2017), 400 mg (08/10/2017), 400 mg (08/24/2017), 400 mg (09/07/2017), 320 mg (09/21/2017), 320 mg (10/05/2017), 320 mg (10/19/2017), 320 mg (11/02/2017), 320 mg (11/16/2017), 320 mg (11/30/2017), 320 mg (12/14/2017), 320 mg (12/28/2017), 320 mg (01/12/2018), 320 mg (01/26/2018), 320 mg (02/09/2018), 300 mg (02/24/2018), 300 mg (03/10/2018), 300 mg (03/29/2018), 300 mg (04/12/2018), 300 mg (04/26/2018), 300 mg (05/10/2018), 300 mg (05/24/2018), 300 mg (06/07/2018), 300 mg (06/21/2018), 300 mg (07/05/2018), 300 mg (07/19/2018) leucovorin 800 mg in dextrose 5 % 250 mL infusion, 872 mg, Intravenous,  Once, 26 of 30 cycles Dose modification: 320 mg/m2 (80 % of original dose 400 mg/m2, Cycle 5, Reason: Provider Judgment) Administration: 800 mg (07/27/2017), 800 mg (08/10/2017), 800 mg (08/24/2017), 800 mg (09/07/2017), 700 mg (09/21/2017), 700 mg (10/05/2017), 700 mg (10/19/2017), 700 mg (11/02/2017), 700 mg (11/16/2017), 700 mg (11/30/2017), 700 mg (12/14/2017), 700 mg (12/28/2017), 700 mg (01/12/2018), 700 mg (01/26/2018), 700 mg (02/09/2018), 700 mg (02/24/2018), 700 mg (03/10/2018), 700 mg (03/29/2018), 700 mg (04/12/2018), 700 mg (04/26/2018), 700 mg (05/10/2018), 700 mg (05/24/2018), 700 mg (06/07/2018), 700 mg (06/21/2018), 700 mg (07/05/2018), 700 mg (07/19/2018) fluorouracil (ADRUCIL) chemo injection 850 mg, 400 mg/m2 = 850 mg, Intravenous,  Once, 26 of 30 cycles Dose modification: 320 mg/m2 (  80 % of original dose 400 mg/m2, Cycle 5, Reason: Provider Judgment) Administration: 850 mg  (07/27/2017), 850 mg (08/10/2017), 850 mg (08/24/2017), 850 mg (09/07/2017), 700 mg (09/21/2017), 700 mg (10/05/2017), 700 mg (10/19/2017), 700 mg (11/02/2017), 700 mg (11/16/2017), 700 mg (11/30/2017), 700 mg (12/14/2017), 700 mg (12/28/2017), 700 mg (01/12/2018), 700 mg (01/26/2018), 700 mg (02/09/2018), 650 mg (02/24/2018), 650 mg (03/10/2018), 650 mg (03/29/2018), 650 mg (04/12/2018), 650 mg (04/26/2018), 650 mg (05/10/2018), 650 mg (05/24/2018), 650 mg (06/07/2018), 650 mg (06/21/2018), 650 mg (07/05/2018), 650 mg (07/19/2018) fluorouracil (ADRUCIL) 5,250 mg in sodium chloride 0.9 % 145 mL chemo infusion, 2,400 mg/m2 = 5,250 mg, Intravenous, 1 Day/Dose, 26 of 30 cycles Dose modification: 1,920 mg/m2 (80 % of original dose 2,400 mg/m2, Cycle 5, Reason: Provider Judgment) Administration: 5,250 mg (07/27/2017), 5,250 mg (08/10/2017), 5,250 mg (08/24/2017), 5,250 mg (09/07/2017), 4,200 mg (09/21/2017), 4,200 mg (10/05/2017), 4,200 mg (10/19/2017), 4,200 mg (11/02/2017), 4,200 mg (11/16/2017), 4,200 mg (11/30/2017), 4,200 mg (12/14/2017), 4,200 mg (12/28/2017), 4,200 mg (01/12/2018), 4,200 mg (01/26/2018), 4,200 mg (02/09/2018), 3,950 mg (02/24/2018), 3,950 mg (03/10/2018), 3,950 mg (03/29/2018), 3,950 mg (04/12/2018), 3,950 mg (04/26/2018), 3,950 mg (05/10/2018), 3,950 mg (05/24/2018), 3,950 mg (06/07/2018), 3,950 mg (06/21/2018), 3,950 mg (07/05/2018), 3,950 mg (07/19/2018)  for chemotherapy treatment.       CANCER STAGING: Cancer Staging Malignant neoplasm of transverse colon [facility]) Staging form: Colon and Rectum, AJCC 8th Edition - Clinical: cT3, cN1a, cM1 - Signed by Twana First, MD on 03/09/2017 - Pathologic: No stage assigned - Unsigned    INTERVAL HISTORY:  Mr. Sawatzky 85 y.o. male returns for routine follow-up and consideration for next cycle of chemotherapy. He is here today alone. He states that he experienced nausea the day he had to come and get the pump removed. He states that his numbness in his fingers and toes is worse. Denies any nausea,  vomiting, or diarrhea. Denies any new pains. Had not noticed any recent bleeding such as epistaxis, hematuria or hematochezia. Denies recent chest pain on exertion, shortness of breath on minimal exertion, pre-syncopal episodes, or palpitations. Denies any recent fevers, infections, or recent hospitalizations. Patient reports appetite at 90% and energy level at 75%.      REVIEW OF SYSTEMS:  Review of Systems  Neurological: Positive for numbness.  All other systems reviewed and are negative.    PAST MEDICAL/SURGICAL HISTORY:  Past Medical History:  Diagnosis Date   Chronic pulmonary embolism ([place]) 40/9735   compliction of the valve replacement surgery   COPD (chronic obstructive pulmonary disease) ([place])    Diabetes ([place]) 04/22/2016   H/O aortic valve replacement 10/2008   Hypercholesteremia    Hypertension    Pulmonary emboli ([place]) 10/2008   Sleep apnea    Past Surgical History:  Procedure Laterality Date   AORTIC VALVE REPLACEMENT     2010   CARDIAC CATHETERIZATION  2010   COLONOSCOPY N/A 06/05/2016   Procedure: COLONOSCOPY;  Surgeon: Rogene Houston, MD;  Location: [facility] ENDO SUITE;  Service: Endoscopy;  Laterality: N/A;   PARTIAL COLECTOMY N/A 07/11/2016   Procedure: PARTIAL COLECTOMY;  Surgeon: Aviva Signs, MD;  Location: [facility] ORS;  Service: General;  Laterality: N/A;   PORTACATH PLACEMENT N/A 08/13/2016   Procedure: INSERTION PORT-A-CATH LEFT SUBCLAVIAN;  Surgeon: Aviva Signs, MD;  Location: [facility] ORS;  Service: General;  Laterality: N/A;   [place]     from trauma     SOCIAL HISTORY:  Social History  Socioeconomic History   Marital status: Widowed    Spouse name: Not on file   Number of children: Not on file   Years of education: Not on file   Highest education level: Not on file  Occupational History   Not on file  Social Needs   Financial resource strain: Not on file   Food insecurity:    Worry: Not on  file    Inability: Not on file   Transportation needs:    Medical: Not on file    Non-medical: Not on file  Tobacco Use   Smoking status: Former Smoker    Years: 15.00    Types: Cigarettes    Last attempt to quit: 1964    Years since quitting: 56.3   Smokeless tobacco: Never Used  Substance and Sexual Activity   Alcohol use: No   Drug use: No   Sexual activity: Yes  Lifestyle   Physical activity:    Days per week: Not on file    Minutes per session: Not on file   Stress: Not on file  Relationships   Social connections:    Talks on phone: Not on file    Gets together: Not on file    Attends religious service: Not on file    Active member of club or organization: Not on file    Attends meetings of clubs or organizations: Not on file    Relationship status: Not on file   Intimate partner violence:    Fear of current or ex partner: Not on file    Emotionally abused: Not on file    Physically abused: Not on file    Forced sexual activity: Not on file  Other Topics Concern   Not on file  Social History Narrative   Not on file    FAMILY HISTORY:  Family History  Problem Relation Age of Onset   Colon cancer Neg Hx     CURRENT MEDICATIONS:  Outpatient Encounter Medications as of 07/19/2018  Medication Sig Note   atorvastatin (LIPITOR) 10 MG tablet Take 10 mg by mouth at bedtime.     Cholecalciferol (VITAMIN D3) 5000 units CAPS Take 5,000 Units by mouth daily.     diphenoxylate-atropine (LOMOTIL) 2.5-0.025 MG tablet Take 1 tablet by mouth 4 (four) times daily as needed for diarrhea or loose stools.    gabapentin (NEURONTIN) 600 MG tablet Take 1 tablet (600 mg total) by mouth 2 (two) times daily.    lisinopril (PRINIVIL,ZESTRIL) 10 MG tablet Take 10 mg by mouth daily.    metoprolol (LOPRESSOR) 50 MG tablet Take 50 mg by mouth 2 (two) times daily.    Multiple Vitamins-Minerals (CENTRUM SILVER 50+MEN PO) Take 1 tablet by mouth daily.    ondansetron  (ZOFRAN) 4 MG tablet Take 4 mg by mouth 2 (two) times daily. Take one tablet two times a day as needed for nausea.    RELION GLUCOSE TEST STRIPS test strip USE 1 STRIP TO CHECK GLUCOSE ONCE DAILY    vitamin B-12 (CYANOCOBALAMIN) 1000 MCG tablet Take 1,000 mcg by mouth daily.     [DISCONTINUED] prochlorperazine (COMPAZINE) 10 MG tablet Take 1 tablet (10 mg total) by mouth every 6 (six) hours as needed (Nausea or vomiting). 08/05/2016: New Med   Facility-Administered Encounter Medications as of 07/19/2018  Medication   sodium chloride flush (NS) 0.9 % injection 10 mL    ALLERGIES:  Allergies  Allergen Reactions   Amoxicillin Hives    Has patient had a PCN  reaction causing immediate rash, facial/tongue/throat swelling, SOB or lightheadedness with hypotension: No Has patient had a PCN reaction causing severe rash involving mucus membranes or skin necrosis: Yes Has patient had a PCN reaction that required hospitalization No Has patient had a PCN reaction occurring within the last 10 years: No If all of the above answers are "NO", then may proceed with Cephalosporin use.    Tamsulosin Hcl     Cant sleep, confusion      PHYSICAL EXAM:  ECOG Performance status: 1 Blood pressure is 149/71 pulse is 75 respiratory rate is 18 temperature 98.1 oxygen saturation is 99. Physical Exam Vitals signs reviewed.  Constitutional:      Appearance: Normal appearance.  Cardiovascular:     Rate and Rhythm: Normal rate and regular rhythm.     Heart sounds: Normal heart sounds.  Pulmonary:     Effort: Pulmonary effort is normal.     Breath sounds: Normal breath sounds.  Abdominal:     General: There is no distension.     Palpations: Abdomen is soft. There is no mass.  Musculoskeletal:        General: No swelling.  Skin:    General: Skin is warm.  Neurological:     General: No focal deficit present.     Mental Status: He is alert and oriented to person, place, and time.  Psychiatric:         Mood and Affect: Mood normal.        Behavior: Behavior normal.      LABORATORY DATA:  I have reviewed the labs as listed.  CBC    Component Value Date/Time   WBC 6.3 07/19/2018 0802   RBC 3.06 (L) 07/19/2018 0802   HGB 10.5 (L) 07/19/2018 0802   HCT 33.3 (L) 07/19/2018 0802   PLT 203 07/19/2018 0802   MCV 108.8 (H) 07/19/2018 0802   MCH 34.3 (H) 07/19/2018 0802   MCHC 31.5 07/19/2018 0802   RDW 15.5 07/19/2018 0802   LYMPHSABS 2.5 07/19/2018 0802   MONOABS 1.2 (H) 07/19/2018 0802   EOSABS 0.1 07/19/2018 0802   BASOSABS 0.0 07/19/2018 0802   CMP Latest Ref Rng & Units 07/19/2018 07/05/2018 06/21/2018  Glucose 70 - 99 mg/dL 129(H) 150(H) 152(H)  BUN 8 - 23 mg/dL 13 14 24(H)  Creatinine 0.61 - 1.24 mg/dL 0.84 0.90 1.28(H)  Sodium 135 - 145 mmol/L 141 137 139  Potassium 3.5 - 5.1 mmol/L 4.1 3.9 4.1  Chloride 98 - 111 mmol/L 108 106 107  CO2 22 - 32 mmol/L '25 23 22  ' Calcium 8.9 - 10.3 mg/dL 8.7(L) 8.4(L) 9.1  Total Protein 6.5 - 8.1 g/dL 6.2(L) 6.3(L) 7.1  Total Bilirubin 0.3 - 1.2 mg/dL 0.7 1.2 0.9  Alkaline Phos 38 - 126 U/L 41 40 47  AST 15 - 41 U/L '24 27 29  ' ALT 0 - 44 U/L '18 20 20       ' DIAGNOSTIC IMAGING:  I have independently reviewed the scans and discussed with the patient.   I have reviewed Venita Lick LPN's note and agree with the documentation.  I personally performed a face-to-face visit, made revisions and my assessment and plan is as follows.    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  Stage IV colon cancer (pT3 pN1 M1) MSI -High: -K-ras mutation negative, BRAF V600E positive, PIK3CA, CDKN2A,TP53 - 12 cycles of FOLFOX from 08/26/2016 through 01/27/2017. -Opdivo from 02/23/2017 through 07/13/2017. -FOLFIRI and bevacizumab started  on 07/27/2017 - CT CAP on 05/07/2018 shows similar right pelvic nodule and left abdominal omental metastasis.  No progressive disease.  Similar mild thoracic adenopathy.  Similar nonspecific bilateral  pulmonary nodules. - Cycle 25 was on 07/05/2018. -He did not experience any bleeding episodes.  Denies any major side effects although he had 1 day of vomiting after last cycle. -I have reviewed his blood work today.  He may proceed with chemotherapy without any dose modifications. -I will see him back in 2 weeks for follow-up.  I plan to repeat a CT CAP prior to next visit along with a CEA level.   2.  Peripheral neuropathy: -He has tingling in the hands and feet which is constant.  He will continue gabapentin 600 mg twice daily.  3.  Diarrhea: -He will continue Imodium and Lomotil twice daily.  He has 2-3 watery bowel movements per day.   4.  Weight loss: - He lost about 5 pounds since last visit. -I have recommended to drink 1 can of Ensure per day.  He is eating 3 meals per day and has good appetite.    Total time spent is 25 minutes with more than 50% of the time spent face-to-face discussing and reinforcing treatment plan, side effects and coordination of care.    Orders placed this encounter:  Orders Placed This Encounter  Procedures   CT Chest W Contrast   CT Abdomen Pelvis W Contrast   Iron and TIBC   Ferritin   CBC with Differential/Platelet   Comprehensive metabolic panel   CEA      Derek Jack, MD [place] [phone]

## 2018-07-20 ENCOUNTER — Other Ambulatory Visit: Payer: Self-pay

## 2018-07-20 LAB — CEA: CEA: 8.4 ng/mL — ABNORMAL HIGH (ref 0.0–4.7)

## 2018-07-21 ENCOUNTER — Inpatient Hospital Stay (HOSPITAL_COMMUNITY): Payer: Medicare Other

## 2018-07-21 VITALS — BP 115/60 | HR 63 | Temp 98.0°F | Resp 18

## 2018-07-21 DIAGNOSIS — C184 Malignant neoplasm of transverse colon: Secondary | ICD-10-CM

## 2018-07-21 DIAGNOSIS — Z5111 Encounter for antineoplastic chemotherapy: Secondary | ICD-10-CM | POA: Diagnosis not present

## 2018-07-21 DIAGNOSIS — Z95828 Presence of other vascular implants and grafts: Secondary | ICD-10-CM

## 2018-07-21 MED ORDER — SODIUM CHLORIDE 0.9% FLUSH
10.0000 mL | INTRAVENOUS | Status: DC | PRN
  Administered 2018-07-21: 10 mL
  Filled 2018-07-21: qty 10

## 2018-07-21 MED ORDER — HEPARIN SOD (PORK) LOCK FLUSH 100 UNIT/ML IV SOLN
500.0000 [IU] | Freq: Once | INTRAVENOUS | Status: AC | PRN
  Administered 2018-07-21: 500 [IU]

## 2018-07-21 NOTE — Progress Notes (Signed)
Pt presents today for pump d/c. VSS. Pt has no complaints of any changes since the last visit.    No complaints at this time. Discharged from clinic ambulatory. F/U with [facility] as scheduled.

## 2018-07-21 NOTE — Patient Instructions (Signed)
[facility] Discharge Instructions for Patients Receiving Chemotherapy  Today you received the following chemotherapy agents   To help prevent nausea and vomiting after your treatment, we encourage you to take your nausea medication   If you develop nausea and vomiting that is not controlled by your nausea medication, call the clinic.   BELOW ARE SYMPTOMS THAT SHOULD BE REPORTED IMMEDIATELY:  *FEVER GREATER THAN 100.5 F  *CHILLS WITH OR WITHOUT FEVER  NAUSEA AND VOMITING THAT IS NOT CONTROLLED WITH YOUR NAUSEA MEDICATION  *UNUSUAL SHORTNESS OF BREATH  *UNUSUAL BRUISING OR BLEEDING  TENDERNESS IN MOUTH AND THROAT WITH OR WITHOUT PRESENCE OF ULCERS  *URINARY PROBLEMS  *BOWEL PROBLEMS  UNUSUAL RASH Items with * indicate a potential emergency and should be followed up as soon as possible.  Feel free to call the clinic should you have any questions or concerns. The clinic phone number is [phone].  Please show the CHEMO ALERT CARD at check-in to the Emergency Department and triage nurse.   

## 2018-07-29 ENCOUNTER — Other Ambulatory Visit: Payer: Self-pay

## 2018-07-29 ENCOUNTER — Ambulatory Visit (HOSPITAL_COMMUNITY)
Admission: RE | Admit: 2018-07-29 | Discharge: 2018-07-29 | Disposition: A | Discharge status: Home / Self Care | Payer: Medicare Other | Source: Ambulatory Visit | Attending: Hematology | Admitting: Hematology

## 2018-07-29 DIAGNOSIS — C184 Malignant neoplasm of transverse colon: Secondary | ICD-10-CM | POA: Insufficient documentation

## 2018-07-29 MED ORDER — IOHEXOL 300 MG/ML  SOLN
100.0000 mL | Freq: Once | INTRAMUSCULAR | Status: AC | PRN
  Administered 2018-07-29: 100 mL via INTRAVENOUS

## 2018-08-03 ENCOUNTER — Inpatient Hospital Stay (HOSPITAL_COMMUNITY): Payer: Medicare Other | Attending: Hematology

## 2018-08-03 ENCOUNTER — Encounter (HOSPITAL_COMMUNITY): Payer: Self-pay | Admitting: Hematology

## 2018-08-03 ENCOUNTER — Inpatient Hospital Stay (HOSPITAL_BASED_OUTPATIENT_CLINIC_OR_DEPARTMENT_OTHER): Payer: Medicare Other | Admitting: Hematology

## 2018-08-03 ENCOUNTER — Inpatient Hospital Stay (HOSPITAL_COMMUNITY): Payer: Medicare Other

## 2018-08-03 ENCOUNTER — Other Ambulatory Visit: Payer: Self-pay

## 2018-08-03 VITALS — BP 128/58 | HR 58 | Temp 97.5°F | Resp 18

## 2018-08-03 DIAGNOSIS — Z95828 Presence of other vascular implants and grafts: Secondary | ICD-10-CM

## 2018-08-03 DIAGNOSIS — R197 Diarrhea, unspecified: Secondary | ICD-10-CM | POA: Diagnosis not present

## 2018-08-03 DIAGNOSIS — G629 Polyneuropathy, unspecified: Secondary | ICD-10-CM

## 2018-08-03 DIAGNOSIS — Z5111 Encounter for antineoplastic chemotherapy: Secondary | ICD-10-CM | POA: Diagnosis present

## 2018-08-03 DIAGNOSIS — Z79899 Other long term (current) drug therapy: Secondary | ICD-10-CM | POA: Diagnosis not present

## 2018-08-03 DIAGNOSIS — C786 Secondary malignant neoplasm of retroperitoneum and peritoneum: Secondary | ICD-10-CM | POA: Diagnosis not present

## 2018-08-03 DIAGNOSIS — C184 Malignant neoplasm of transverse colon: Secondary | ICD-10-CM

## 2018-08-03 DIAGNOSIS — R634 Abnormal weight loss: Secondary | ICD-10-CM | POA: Diagnosis not present

## 2018-08-03 LAB — CBC WITH DIFFERENTIAL/PLATELET
Abs Immature Granulocytes: 0.02 10*3/uL (ref 0.00–0.07)
Basophils Absolute: 0 10*3/uL (ref 0.0–0.1)
Basophils Relative: 1 %
Eosinophils Absolute: 0.1 10*3/uL (ref 0.0–0.5)
Eosinophils Relative: 2 %
HCT: 34.9 % — ABNORMAL LOW (ref 39.0–52.0)
Hemoglobin: 11.1 g/dL — ABNORMAL LOW (ref 13.0–17.0)
Immature Granulocytes: 0 %
Lymphocytes Relative: 40 %
Lymphs Abs: 2.1 10*3/uL (ref 0.7–4.0)
MCH: 35.1 pg — ABNORMAL HIGH (ref 26.0–34.0)
MCHC: 31.8 g/dL (ref 30.0–36.0)
MCV: 110.4 fL — ABNORMAL HIGH (ref 80.0–100.0)
Monocytes Absolute: 1 10*3/uL (ref 0.1–1.0)
Monocytes Relative: 19 %
Neutro Abs: 2 10*3/uL (ref 1.7–7.7)
Neutrophils Relative %: 38 %
Platelets: 242 10*3/uL (ref 150–400)
RBC: 3.16 MIL/uL — ABNORMAL LOW (ref 4.22–5.81)
RDW: 14.7 % (ref 11.5–15.5)
WBC: 5.3 10*3/uL (ref 4.0–10.5)
nRBC: 0 % (ref 0.0–0.2)

## 2018-08-03 LAB — URINALYSIS, DIPSTICK ONLY
Bilirubin Urine: NEGATIVE
Glucose, UA: NEGATIVE mg/dL
Hgb urine dipstick: NEGATIVE
Ketones, ur: NEGATIVE mg/dL
Leukocytes,Ua: NEGATIVE
Nitrite: NEGATIVE
Protein, ur: NEGATIVE mg/dL
Specific Gravity, Urine: 1.019 (ref 1.005–1.030)
pH: 6 (ref 5.0–8.0)

## 2018-08-03 LAB — COMPREHENSIVE METABOLIC PANEL
ALT: 22 U/L (ref 0–44)
AST: 25 U/L (ref 15–41)
Albumin: 3.6 g/dL (ref 3.5–5.0)
Alkaline Phosphatase: 45 U/L (ref 38–126)
Anion gap: 8 (ref 5–15)
BUN: 18 mg/dL (ref 8–23)
CO2: 27 mmol/L (ref 22–32)
Calcium: 9 mg/dL (ref 8.9–10.3)
Chloride: 104 mmol/L (ref 98–111)
Creatinine, Ser: 0.84 mg/dL (ref 0.61–1.24)
GFR calc Af Amer: >60 mL/min (ref >60)
GFR calc non Af Amer: >60 mL/min (ref >60)
Glucose, Bld: 147 mg/dL — ABNORMAL HIGH (ref 70–99)
Potassium: 4.6 mmol/L (ref 3.5–5.1)
Sodium: 139 mmol/L (ref 135–145)
Total Bilirubin: 0.7 mg/dL (ref 0.3–1.2)
Total Protein: 6.7 g/dL (ref 6.5–8.1)

## 2018-08-03 LAB — FERRITIN: Ferritin: 200 ng/mL (ref 24–336)

## 2018-08-03 LAB — IRON AND TIBC
Iron: 76 ug/dL (ref 45–182)
Saturation Ratios: 23 % (ref 17.9–39.5)
TIBC: 331 ug/dL (ref 250–450)
UIBC: 255 ug/dL

## 2018-08-03 MED ORDER — FLUOROURACIL CHEMO INJECTION 2.5 GM/50ML
320.0000 mg/m2 | Freq: Once | INTRAVENOUS | Status: AC
  Administered 2018-08-03: 650 mg via INTRAVENOUS
  Filled 2018-08-03: qty 13

## 2018-08-03 MED ORDER — ATROPINE SULFATE 1 MG/ML IJ SOLN
0.5000 mg | Freq: Once | INTRAMUSCULAR | Status: AC
  Administered 2018-08-03: 0.5 mg via INTRAVENOUS

## 2018-08-03 MED ORDER — PALONOSETRON HCL INJECTION 0.25 MG/5ML
INTRAVENOUS | Status: AC
  Filled 2018-08-03: qty 5

## 2018-08-03 MED ORDER — PALONOSETRON HCL INJECTION 0.25 MG/5ML
0.2500 mg | Freq: Once | INTRAVENOUS | Status: AC
  Administered 2018-08-03: 0.25 mg via INTRAVENOUS

## 2018-08-03 MED ORDER — ATROPINE SULFATE 1 MG/ML IJ SOLN
INTRAMUSCULAR | Status: AC
  Filled 2018-08-03: qty 1

## 2018-08-03 MED ORDER — SODIUM CHLORIDE 0.9 % IV SOLN
Freq: Once | INTRAVENOUS | Status: AC
  Administered 2018-08-03: 10:00:00 via INTRAVENOUS

## 2018-08-03 MED ORDER — SODIUM CHLORIDE 0.9% FLUSH
10.0000 mL | INTRAVENOUS | Status: DC | PRN
  Administered 2018-08-03: 10 mL
  Filled 2018-08-03: qty 10

## 2018-08-03 MED ORDER — SODIUM CHLORIDE 0.9 % IV SOLN
10.0000 mg | Freq: Once | INTRAVENOUS | Status: AC
  Administered 2018-08-03: 10 mg via INTRAVENOUS
  Filled 2018-08-03: qty 10

## 2018-08-03 MED ORDER — SODIUM CHLORIDE 0.9 % IV SOLN
1920.0000 mg/m2 | INTRAVENOUS | Status: DC
  Administered 2018-08-03: 3950 mg via INTRAVENOUS
  Filled 2018-08-03: qty 79

## 2018-08-03 MED ORDER — SODIUM CHLORIDE 0.9 % IV SOLN
700.0000 mg | Freq: Once | INTRAVENOUS | Status: AC
  Administered 2018-08-03: 700 mg via INTRAVENOUS
  Filled 2018-08-03: qty 35

## 2018-08-03 MED ORDER — SODIUM CHLORIDE 0.9 % IV SOLN
INTRAVENOUS | Status: DC
  Administered 2018-08-03: 11:00:00 via INTRAVENOUS

## 2018-08-03 MED ORDER — SODIUM CHLORIDE 0.9 % IV SOLN
400.0000 mg | Freq: Once | INTRAVENOUS | Status: AC
  Administered 2018-08-03: 400 mg via INTRAVENOUS
  Filled 2018-08-03: qty 16

## 2018-08-03 MED ORDER — SODIUM CHLORIDE 0.9 % IV SOLN
144.0000 mg/m2 | Freq: Once | INTRAVENOUS | Status: AC
  Administered 2018-08-03: 300 mg via INTRAVENOUS
  Filled 2018-08-03: qty 15

## 2018-08-03 NOTE — Patient Instructions (Signed)
[facility] Discharge Instructions for Patients Receiving Chemotherapy   Beginning January 23rd 2017 lab work for the Cancer Center will be done in the  Main lab at [facility] on 1st floor. If you have a lab appointment with the Cancer Center please come in thru the  Main Entrance and check in at the main information desk   Today you received the following chemotherapy agents Avastin,Irinotecan,Leucovorin and 5FU. Follow-up as scheduled. Call clinic for any questions or concerns  To help prevent nausea and vomiting after your treatment, we encourage you to take your nausea medication   If you develop nausea and vomiting, or diarrhea that is not controlled by your medication, call the clinic.  The clinic phone number is [phone]. Office hours are Monday-Friday 8:30am-5:00pm.  BELOW ARE SYMPTOMS THAT SHOULD BE REPORTED IMMEDIATELY:  *FEVER GREATER THAN 101.0 F  *CHILLS WITH OR WITHOUT FEVER  NAUSEA AND VOMITING THAT IS NOT CONTROLLED WITH YOUR NAUSEA MEDICATION  *UNUSUAL SHORTNESS OF BREATH  *UNUSUAL BRUISING OR BLEEDING  TENDERNESS IN MOUTH AND THROAT WITH OR WITHOUT PRESENCE OF ULCERS  *URINARY PROBLEMS  *BOWEL PROBLEMS  UNUSUAL RASH Items with * indicate a potential emergency and should be followed up as soon as possible. If you have an emergency after office hours please contact your primary care physician or go to the nearest emergency department.  Please call the clinic during office hours if you have any questions or concerns.   You may also contact the Patient Navigator at [phone] should you have any questions or need assistance in obtaining follow up care.      Resources For Cancer Patients and their Caregivers ? American Cancer Society: Can assist with transportation, wigs, general needs, runs Look Good Feel Better.        1-888-227-6333 ? Cancer Care: Provides financial assistance, online support groups, medication/co-pay  assistance.  1-800-813-HOPE (4673) ? [facility] Assists [place] cancer patients and their families through emotional , educational and financial support.  336-427-4357 ? [place] DSS Where to apply for food stamps, Medicaid and utility assistance. 336-342-1394 ? [facility]: Transportation to medical appointments. 336-347-2287 ? Social Security Administration: May apply for disability if have a Stage IV cancer. 336-342-7796 1-800-772-1213 ? [place] [facility]ging, Disability and Transit Services: Assists with nutrition, care and transit needs. 336-349-2343         

## 2018-08-03 NOTE — Progress Notes (Signed)
[address] [place], [place] [address]   CLINIC:  Medical Oncology/Hematology  PCP:  Tobe Sos, MD [address] 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[place])    07/27/2017 -  Chemotherapy    The patient had palonosetron (ALOXI) injection 0.25 mg, 0.25 mg, Intravenous,  Once, 27 of 30 cycles Administration: 0.25 mg (07/27/2017), 0.25 mg (08/10/2017), 0.25 mg (08/24/2017), 0.25 mg (09/07/2017), 0.25 mg (09/21/2017), 0.25 mg (10/05/2017), 0.25 mg (10/19/2017), 0.25 mg (11/02/2017), 0.25 mg (11/16/2017), 0.25 mg (11/30/2017), 0.25 mg (12/14/2017), 0.25 mg (12/28/2017), 0.25 mg (01/12/2018), 0.25 mg (01/26/2018), 0.25 mg (02/09/2018), 0.25 mg (02/24/2018), 0.25 mg (03/10/2018), 0.25 mg (03/29/2018), 0.25 mg (04/12/2018), 0.25 mg (04/26/2018), 0.25 mg (05/10/2018), 0.25 mg (05/24/2018), 0.25 mg (06/07/2018), 0.25 mg (06/21/2018), 0.25 mg (07/05/2018), 0.25 mg (07/19/2018), 0.25 mg (08/03/2018) pegfilgrastim-cbqv (UDENYCA) injection 6 mg, 6 mg, Subcutaneous, Once, 7 of 7 cycles Administration: 6 mg (10/07/2017), 6 mg (10/21/2017), 6 mg (11/04/2017), 6 mg (11/18/2017), 6 mg (12/02/2017), 6 mg (12/16/2017), 6 mg (12/30/2017) bevacizumab (AVASTIN) 450 mg in sodium chloride 0.9 % 100 mL chemo infusion, 5 mg/kg = 450 mg, Intravenous,  Once, 27 of 30 cycles Dose modification: 4 mg/kg (80 % of original dose 5 mg/kg, Cycle 5, Reason: Provider Judgment), 5 mg/kg (original dose 5 mg/kg, Cycle 16, Reason: Other (see comments)) Administration: 450 mg (07/27/2017), 450 mg (08/10/2017), 450 mg (08/24/2017), 450 mg (09/07/2017), 450 mg (09/21/2017), 450 mg (10/05/2017), 450 mg (10/19/2017), 450 mg (11/02/2017), 450 mg (11/16/2017), 450 mg (11/30/2017), 450 mg (12/14/2017), 450 mg (12/28/2017), 400 mg (01/12/2018), 400 mg (01/26/2018),  400 mg (02/09/2018), 400 mg (02/24/2018), 400 mg (03/10/2018), 400 mg (03/29/2018), 400 mg (04/12/2018), 400 mg (04/26/2018), 400 mg (05/10/2018), 400 mg (05/24/2018), 400 mg (06/07/2018), 400 mg (06/21/2018), 400 mg (07/05/2018), 400 mg (07/19/2018), 400 mg (08/03/2018) irinotecan (CAMPTOSAR) 400 mg in dextrose 5 % 500 mL chemo infusion, 180 mg/m2 = 400 mg, Intravenous,  Once, 27 of 30 cycles Dose modification: 144 mg/m2 (80 % of original dose 180 mg/m2, Cycle 5, Reason: Provider Judgment) Administration: 400 mg (07/27/2017), 400 mg (08/10/2017), 400 mg (08/24/2017), 400 mg (09/07/2017), 320 mg (09/21/2017), 320 mg (10/05/2017), 320 mg (10/19/2017), 320 mg (11/02/2017), 320 mg (11/16/2017), 320 mg (11/30/2017), 320 mg (12/14/2017), 320 mg (12/28/2017), 320 mg (01/12/2018), 320 mg (01/26/2018), 320 mg (02/09/2018), 300 mg (02/24/2018), 300 mg (03/10/2018), 300 mg (03/29/2018), 300 mg (04/12/2018), 300 mg (04/26/2018), 300 mg (05/10/2018), 300 mg (05/24/2018), 300 mg (06/07/2018), 300 mg (06/21/2018), 300 mg (07/05/2018), 300 mg (07/19/2018), 300 mg (08/03/2018) leucovorin 800 mg in dextrose 5 % 250 mL infusion, 872 mg, Intravenous,  Once, 27 of 30 cycles Dose modification: 320 mg/m2 (80 % of original dose 400 mg/m2, Cycle 5, Reason: Provider Judgment) Administration: 800 mg (07/27/2017), 800 mg (08/10/2017), 800 mg (08/24/2017), 800 mg (09/07/2017), 700 mg (09/21/2017), 700 mg (10/05/2017), 700 mg (10/19/2017), 700 mg (11/02/2017), 700 mg (11/16/2017), 700 mg (11/30/2017), 700 mg (12/14/2017), 700 mg (12/28/2017), 700 mg (01/12/2018), 700 mg (01/26/2018), 700 mg (02/09/2018), 700 mg (02/24/2018), 700 mg (03/10/2018), 700 mg (03/29/2018), 700 mg (04/12/2018), 700 mg (04/26/2018), 700 mg (05/10/2018), 700 mg (05/24/2018), 700 mg (06/07/2018), 700 mg (06/21/2018), 700 mg (07/05/2018), 700 mg (07/19/2018), 700 mg (08/03/2018) fluorouracil (ADRUCIL) chemo injection 850 mg, 400 mg/m2 = 850  mg, Intravenous,  Once, 27 of 30 cycles Dose modification: 320 mg/m2 (80 % of original dose 400  mg/m2, Cycle 5, Reason: Provider Judgment) Administration: 850 mg (07/27/2017), 850 mg (08/10/2017), 850 mg (08/24/2017), 850 mg (09/07/2017), 700 mg (09/21/2017), 700 mg (10/05/2017), 700 mg (10/19/2017), 700 mg (11/02/2017), 700 mg (11/16/2017), 700 mg (11/30/2017), 700 mg (12/14/2017), 700 mg (12/28/2017), 700 mg (01/12/2018), 700 mg (01/26/2018), 700 mg (02/09/2018), 650 mg (02/24/2018), 650 mg (03/10/2018), 650 mg (03/29/2018), 650 mg (04/12/2018), 650 mg (04/26/2018), 650 mg (05/10/2018), 650 mg (05/24/2018), 650 mg (06/07/2018), 650 mg (06/21/2018), 650 mg (07/05/2018), 650 mg (07/19/2018) fluorouracil (ADRUCIL) 5,250 mg in sodium chloride 0.9 % 145 mL chemo infusion, 2,400 mg/m2 = 5,250 mg, Intravenous, 1 Day/Dose, 27 of 30 cycles Dose modification: 1,920 mg/m2 (80 % of original dose 2,400 mg/m2, Cycle 5, Reason: Provider Judgment) Administration: 5,250 mg (07/27/2017), 5,250 mg (08/10/2017), 5,250 mg (08/24/2017), 5,250 mg (09/07/2017), 4,200 mg (09/21/2017), 4,200 mg (10/05/2017), 4,200 mg (10/19/2017), 4,200 mg (11/02/2017), 4,200 mg (11/16/2017), 4,200 mg (11/30/2017), 4,200 mg (12/14/2017), 4,200 mg (12/28/2017), 4,200 mg (01/12/2018), 4,200 mg (01/26/2018), 4,200 mg (02/09/2018), 3,950 mg (02/24/2018), 3,950 mg (03/10/2018), 3,950 mg (03/29/2018), 3,950 mg (04/12/2018), 3,950 mg (04/26/2018), 3,950 mg (05/10/2018), 3,950 mg (05/24/2018), 3,950 mg (06/07/2018), 3,950 mg (06/21/2018), 3,950 mg (07/05/2018), 3,950 mg (07/19/2018)  for chemotherapy treatment.       CANCER STAGING: Cancer Staging Malignant neoplasm of transverse colon [facility]) Staging form: Colon and Rectum, AJCC 8th Edition - Clinical: cT3, cN1a, cM1 - Signed by Twana First, MD on 03/09/2017 - Pathologic: No stage assigned - Unsigned    INTERVAL HISTORY:  Kevin [place] 85 y.o. male returns for routine follow-up and consideration for next cycle of chemotherapy. He is here today alone. He states that he has done well since his last visit. He states that he has tiredness for the 2 days  that he has the pump on. He continues to drink 3-4 bottles of Ensure a week. He states that his appetite is good. He states that he stays active around the house and does all the house work. Denies any nausea, vomiting, or diarrhea. Denies any new pains. Had not noticed any recent bleeding such as epistaxis, hematuria or hematochezia. Denies recent chest pain on exertion, shortness of breath on minimal exertion, pre-syncopal episodes, or palpitations. Denies any numbness or tingling in hands or feet. Denies any recent fevers, infections, or recent hospitalizations. Patient reports appetite at 100% and energy level at 100%.      REVIEW OF SYSTEMS:  Review of Systems  Constitutional: Positive for fatigue.  All other systems reviewed and are negative.    PAST MEDICAL/SURGICAL HISTORY:  Past Medical History:  Diagnosis Date   Chronic pulmonary embolism ([place]) 60/7371   compliction of the valve replacement surgery   COPD (chronic obstructive pulmonary disease) ([place])    Diabetes ([place]) 04/22/2016   H/O aortic valve replacement 10/2008   Hypercholesteremia    Hypertension    Pulmonary emboli ([place]) 10/2008   Sleep apnea    Past Surgical History:  Procedure Laterality Date   AORTIC VALVE REPLACEMENT     2010   CARDIAC CATHETERIZATION  2010   COLONOSCOPY N/A 06/05/2016   Procedure: COLONOSCOPY;  Surgeon: Rogene Houston, MD;  Location: [facility] ENDO SU[facility];  Service: Endoscopy;  Laterality: N/A;   PARTIAL COLECTOMY N/A 07/11/2016   Procedure: PARTIAL COLECTOMY;  Surgeon: Aviva Signs, MD;  Location: [facility] ORS;  Service: General;  Laterality: N/A;   PORTACATH PLACEMENT N/A 08/13/2016  Procedure: INSERTION PORT-A-CATH LEFT SUBCLAVIAN;  Surgeon: Aviva Signs, MD;  Location: [facility] ORS;  Service: General;  Laterality: N/A;   REPLACEMENT TOTAL KNEE     1996   spenectomy     from trauma     SOCIAL HISTORY:  Social History   Socioeconomic History   Marital status: Widowed    Spouse name:  Not on file   Number of children: Not on file   Years of education: Not on file   Highest education level: Not on file  Occupational History   Not on file  Social Needs   Financial resource strain: Not on file   Food insecurity:    Worry: Not on file    Inability: Not on file   Transportation needs:    Medical: Not on file    Non-medical: Not on file  Tobacco Use   Smoking status: Former Smoker    Years: 15.00    Types: Cigarettes    Last attempt to quit: 1964    Years since quitting: 56.4   Smokeless tobacco: Never Used  Substance and Sexual Activity   Alcohol use: No   Drug use: No   Sexual activity: Yes  Lifestyle   Physical activity:    Days per week: Not on file    Minutes per session: Not on file   Stress: Not on file  Relationships   Social connections:    Talks on phone: Not on file    Gets together: Not on file    Attends religious service: Not on file    Active member of club or organization: Not on file    Attends meetings of clubs or organizations: Not on file    Relationship status: Not on file   Intimate partner violence:    Fear of current or ex partner: Not on file    Emotionally abused: Not on file    Physically abused: Not on file    Forced sexual activity: Not on file  Other Topics Concern   Not on file  Social History Narrative   Not on file    FAMILY HISTORY:  Family History  Problem Relation Age of Onset   Colon cancer Neg Hx     CURRENT MEDICATIONS:  Outpatient Encounter Medications as of 08/03/2018  Medication Sig Note   atorvastatin (LIPITOR) 10 MG tablet Take 10 mg by mouth at bedtime.     Cholecalciferol (VITAMIN D3) 5000 units CAPS Take 5,000 Units by mouth daily.     diphenoxylate-atropine (LOMOTIL) 2.5-0.025 MG tablet Take 1 tablet by mouth 4 (four) times daily as needed for diarrhea or loose stools.    gabapentin (NEURONTIN) 600 MG tablet Take 1 tablet (600 mg total) by mouth 2 (two) times daily.     lisinopril (PRINIVIL,ZESTRIL) 10 MG tablet Take 10 mg by mouth daily.    metoprolol (LOPRESSOR) 50 MG tablet Take 50 mg by mouth 2 (two) times daily.    Multiple Vitamins-Minerals (CENTRUM SILVER 50+MEN PO) Take 1 tablet by mouth daily.    ondansetron (ZOFRAN) 4 MG tablet Take 4 mg by mouth 2 (two) times daily. Take one tablet two times a day as needed for nausea.    RELION GLUCOSE TEST STRIPS test strip USE 1 STRIP TO CHECK GLUCOSE ONCE DAILY    vitamin B-12 (CYANOCOBALAMIN) 1000 MCG tablet Take 1,000 mcg by mouth daily.     [DISCONTINUED] prochlorperazine (COMPAZINE) 10 MG tablet Take 1 tablet (10 mg total) by mouth every 6 (six) hours  as needed (Nausea or vomiting). 08/05/2016: New Med   Facility-Administered Encounter Medications as of 08/03/2018  Medication   sodium chloride flush (NS) 0.9 % injection 10 mL    ALLERGIES:  Allergies  Allergen Reactions   Amoxicillin Hives    Has patient had a PCN reaction causing immediate rash, facial/tongue/throat swelling, SOB or lightheadedness with hypotension: No Has patient had a PCN reaction causing severe rash involving mucus membranes or skin necrosis: Yes Has patient had a PCN reaction that required hospitalization No Has patient had a PCN reaction occurring within the last 10 years: No If all of the above answers are "NO", then may proceed with Cephalosporin use.    Tamsulosin Hcl     Cant sleep, confusion      PHYSICAL EXAM:  ECOG Performance status: 1  Vitals:   08/03/18 0820  BP: 133/64  Pulse: 67  Resp: 16  Temp: 97.8 F (36.6 C)  SpO2: 98%   Filed Weights   08/03/18 0820  Weight: 172 lb 6.4 oz (78.2 kg)    Physical Exam Vitals signs reviewed.  Constitutional:      Appearance: Normal appearance.  Cardiovascular:     Rate and Rhythm: Normal rate and regular rhythm.     Heart sounds: Normal heart sounds.  Pulmonary:     Effort: Pulmonary effort is normal.     Breath sounds: Normal breath sounds.    Abdominal:     General: There is no distension.     Palpations: Abdomen is soft. There is no mass.  Musculoskeletal:        General: No swelling.  Skin:    General: Skin is warm.  Neurological:     General: No focal deficit present.     Mental Status: He is alert and oriented to person, place, and time.  Psychiatric:        Mood and Affect: Mood normal.        Behavior: Behavior normal.      LABORATORY DATA:  I have reviewed the labs as listed.  CBC    Component Value Date/Time   WBC 5.3 08/03/2018 0801   RBC 3.16 (L) 08/03/2018 0801   HGB 11.1 (L) 08/03/2018 0801   HCT 34.9 (L) 08/03/2018 0801   PLT 242 08/03/2018 0801   MCV 110.4 (H) 08/03/2018 0801   MCH 35.1 (H) 08/03/2018 0801   MCHC 31.8 08/03/2018 0801   RDW 14.7 08/03/2018 0801   LYMPHSABS 2.1 08/03/2018 0801   MONOABS 1.0 08/03/2018 0801   EOSABS 0.1 08/03/2018 0801   BASOSABS 0.0 08/03/2018 0801   CMP Latest Ref Rng & Units 08/03/2018 07/19/2018 07/05/2018  Glucose 70 - 99 mg/dL 147(H) 129(H) 150(H)  BUN 8 - 23 mg/dL _0 Creatinine 0.61 - 1.24 mg/dL 0.84 0.84 0.90  Sodium 135 - 145 mmol/L 139 141 137  Potassium 3.5 - 5.1 mmol/L 4.6 4.1 3.9  Chloride 98 - 111 mmol/L 104 108 106  CO2 22 - 32 mmol/L _1 Calcium 8.9 - 10.3 mg/dL 9.0 8.7(L) 8.4(L)  Total Protein 6.5 - 8.1 g/dL 6.7 6.2(L) 6.3(L)  Total Bilirubin 0.3 - 1.2 mg/dL 0.7 0.7 1.2  Alkaline Phos 38 - 126 U/L 45 41 40  AST 15 - 41 U/L _2 ALT 0 - 44 U/L _3 DIAGNOSTIC IMAGING:  I have independently reviewed the scans and discussed with the patient.   I have reviewed Caryl Pina  Darnelle Maffucci LPN's note and agree with the documentation.  I personally performed a face-to-face visit, made revisions and my assessment and plan is as follows.    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  Stage IV colon cancer (pT3 pN1 M1) MSI -High: -K-ras mutation negative, BRAF V600E positive, PIK3CA, CDKN2A,TP53 -  12 cycles of FOLFOX from 08/26/2016 through 01/27/2017. -Opdivo from 02/23/2017 through 07/13/2017. -FOLFIRI and bevacizumab started on 07/27/2017 - CT CAP on 05/07/2018 shows similar right pelvic nodule and left abdominal omental metastasis.  No progressive disease.  Similar mild thoracic adenopathy.  Similar nonspecific bilateral pulmonary nodules. - Cycle 25 was on 07/05/2018. - CT CAP 07/29/2018: Suggest stable disease with minimal improvement of disease. Also of note, there was interval development of interstitial and ground-glass airspace opacity in the right middle and lower lobes.Features are nonspecific but likely infectious/inflammatory. We will continue to monitor.  - He is tolerating current treatment fairly well. He develops fatigue for 2-3 days after treatment. If side effects progress would consider stopping Irinotecan.  - He will proceed with Cycle 27 today.  RTC in 2 weeks.  2.  Peripheral neuropathy: -He has tingling in the hands and feet which is constant.  He will continue gabapentin 600 mg twice daily.  3.  Diarrhea: -He will continue Imodium and Lomotil twice daily.  He has 2-3 watery bowel movements per day.  4.  Weight loss: -His weight has been stable for the last visit. -She was told to drink Ensure 1 can every day.     Total time spent is 25 minutes with more than 50% of the time spent face-to-face discussing scan results, management options and coordination of care.    Orders placed this encounter:  No orders of the defined types were placed in this encounter.     Derek Jack, MD [place] [phone]

## 2018-08-03 NOTE — Progress Notes (Signed)
0940 Lab and urine results reviewed with and pt seen by Dr. Delton Coombes and pt approved for chemo tx today per MD                                                                               Kevin Kirk tolerated chemo tx well without complaints or incident. Pt discharged with 5FU pump infusing without issues. VSS upon discharge. Pt discharged self ambulatory using his cane in satisfactory condition

## 2018-08-03 NOTE — Patient Instructions (Addendum)
[facility] at [facility] Discharge Instructions  You were seen today by Dr. Katragadda. He went over your recent lab, scan results. He will see you back in 2 weeks for labs and follow up.   Thank you for choosing [facility] at [facility] to provide your oncology and hematology care.  To afford each patient quality time with our provider, please arrive at least 15 minutes before your scheduled appointment time.   If you have a lab appointment with the Cancer Center please come in thru the  Main Entrance and check in at the main information desk  You need to re-schedule your appointment should you arrive 10 or more minutes late.  We strive to give you quality time with our providers, and arriving late affects you and other patients whose appointments are after yours.  Also, if you no show three or more times for appointments you may be dismissed from the clinic at the providers discretion.     Again, thank you for choosing [facility].  Our hope is that these requests will decrease the amount of time that you wait before being seen by our physicians.       _____________________________________________________________  Should you have questions after your visit to  Cancer Center, please contact our office at [phone] between the hours of 8:00 a.m. and 4:30 p.m.  Voicemails left after 4:00 p.m. will not be returned until the following business day.  For prescription refill requests, have your pharmacy contact our office and allow 72 hours.    Cancer Center Support Programs:   > Cancer Support Group  2nd Tuesday of the month 1pm-2pm, Journey Room    

## 2018-08-03 NOTE — Assessment & Plan Note (Addendum)
1.  Stage IV colon cancer (pT3 pN1 M1) MSI -High: -K-ras mutation negative, BRAF V600E positive, PIK3CA, CDKN2A,TP53 - 12 cycles of FOLFOX from 08/26/2016 through 01/27/2017. -Opdivo from 02/23/2017 through 07/13/2017. -FOLFIRI and bevacizumab started on 07/27/2017 - CT CAP on 05/07/2018 shows similar right pelvic nodule and left abdominal omental metastasis.  No progressive disease.  Similar mild thoracic adenopathy.  Similar nonspecific bilateral pulmonary nodules. - Cycle 25 was on 07/05/2018. - CT CAP 07/29/2018: Suggest stable disease with minimal improvement of disease. Also of note, there was interval development of interstitial and ground-glass airspace opacity in the right middle and lower lobes.Features are nonspecific but likely infectious/inflammatory. We will continue to monitor.  - He is tolerating current treatment fairly well. He develops fatigue for 2-3 days after treatment. If side effects progress would consider stopping Irinotecan.  - He will proceed with Cycle 27 today.  RTC in 2 weeks.  2.  Peripheral neuropathy: -He has tingling in the hands and feet which is constant.  He will continue gabapentin 600 mg twice daily.  3.  Diarrhea: -He will continue Imodium and Lomotil twice daily.  He has 2-3 watery bowel movements per day.  4.  Weight loss: -His weight has been stable for the last visit. -She was told to drink Ensure 1 can every day.

## 2018-08-03 NOTE — Patient Instructions (Signed)
[facility] at [facility] Discharge Instructions  Labs drawn from portacath today   Thank you for choosing  Cancer Center at [facility] to provide your oncology and hematology care.  To afford each patient quality time with our provider, please arrive at least 15 minutes before your scheduled appointment time.   If you have a lab appointment with the Cancer Center please come in thru the  Main Entrance and check in at the main information desk  You need to re-schedule your appointment should you arrive 10 or more minutes late.  We strive to give you quality time with our providers, and arriving late affects you and other patients whose appointments are after yours.  Also, if you no show three or more times for appointments you may be dismissed from the clinic at the providers discretion.     Again, thank you for choosing [facility].  Our hope is that these requests will decrease the amount of time that you wait before being seen by our physicians.       _____________________________________________________________  Should you have questions after your visit to [facility], please contact our office at [phone] between the hours of 8:00 a.m. and 4:30 p.m.  Voicemails left after 4:00 p.m. will not be returned until the following business day.  For prescription refill requests, have your pharmacy contact our office and allow 72 hours.    Cancer Center Support Programs:   > Cancer Support Group  2nd Tuesday of the month 1pm-2pm, Journey Room   

## 2018-08-04 LAB — CEA: CEA: 8.5 ng/mL — ABNORMAL HIGH (ref 0.0–4.7)

## 2018-08-05 ENCOUNTER — Other Ambulatory Visit: Payer: Self-pay

## 2018-08-05 ENCOUNTER — Inpatient Hospital Stay (HOSPITAL_COMMUNITY): Payer: Medicare Other

## 2018-08-05 ENCOUNTER — Encounter (HOSPITAL_COMMUNITY): Payer: Self-pay

## 2018-08-05 VITALS — BP 143/68 | HR 58 | Temp 97.8°F | Resp 18

## 2018-08-05 DIAGNOSIS — Z95828 Presence of other vascular implants and grafts: Secondary | ICD-10-CM

## 2018-08-05 DIAGNOSIS — Z5111 Encounter for antineoplastic chemotherapy: Secondary | ICD-10-CM | POA: Diagnosis not present

## 2018-08-05 DIAGNOSIS — C184 Malignant neoplasm of transverse colon: Secondary | ICD-10-CM

## 2018-08-05 MED ORDER — SODIUM CHLORIDE 0.9% FLUSH
10.0000 mL | INTRAVENOUS | Status: DC | PRN
  Administered 2018-08-05: 10 mL
  Filled 2018-08-05: qty 10

## 2018-08-05 MED ORDER — HEPARIN SOD (PORK) LOCK FLUSH 100 UNIT/ML IV SOLN
500.0000 [IU] | Freq: Once | INTRAVENOUS | Status: AC | PRN
  Administered 2018-08-05: 500 [IU]

## 2018-08-05 NOTE — Progress Notes (Signed)
Kevin Kirk tolerated 5FU pump well without complaints or incident. 5FU pump discontinued with port flushed with 10 ml NS and 5 ml Heparin easily per protocol then de-accessed. VSS Pt discharged self ambulatory using his cane in satisfactory condition

## 2018-08-05 NOTE — Patient Instructions (Signed)
[place] at [facility] Discharge Instructions  5FU pump discontinued with portacath flushed per protocol today. Follow-up as scheduled. Call clinic for any questions or concerns    Thank you for choosing [place] at [facility] to provide your oncology and hematology care.  To afford each patient quality time with our provider, please arrive at least 15 minutes before your scheduled appointment time.   If you have a lab appointment with the [place] please come in thru the  Main Entrance and check in at the main information desk  You need to re-schedule your appointment should you arrive 10 or more minutes late.  We strive to give you quality time with our providers, and arriving late affects you and other patients whose appointments are after yours.  Also, if you no show three or more times for appointments you may be dismissed from the clinic at the providers discretion.     Again, thank you for choosing [facility].  Our hope is that these requests will decrease the amount of time that you wait before being seen by our physicians.       _____________________________________________________________  Should you have questions after your visit to [facility], please contact our office at [phone] between the hours of 8:00 a.m. and 4:30 p.m.  Voicemails left after 4:00 p.m. will not be returned until the following business day.  For prescription refill requests, have your pharmacy contact our office and allow 72 hours.    Cancer Center Support Programs:   > Cancer Support Group  2nd Tuesday of the month 1pm-2pm, Journey Room

## 2018-08-12 ENCOUNTER — Other Ambulatory Visit (HOSPITAL_COMMUNITY): Payer: Self-pay | Admitting: Internal Medicine

## 2018-08-12 DIAGNOSIS — T451X5A Adverse effect of antineoplastic and immunosuppressive drugs, initial encounter: Secondary | ICD-10-CM

## 2018-08-12 DIAGNOSIS — K521 Toxic gastroenteritis and colitis: Secondary | ICD-10-CM

## 2018-08-12 DIAGNOSIS — C184 Malignant neoplasm of transverse colon: Secondary | ICD-10-CM

## 2018-08-13 ENCOUNTER — Other Ambulatory Visit (HOSPITAL_COMMUNITY): Payer: Self-pay | Admitting: *Deleted

## 2018-08-13 ENCOUNTER — Other Ambulatory Visit (HOSPITAL_COMMUNITY): Payer: Self-pay | Admitting: Internal Medicine

## 2018-08-13 DIAGNOSIS — C184 Malignant neoplasm of transverse colon: Secondary | ICD-10-CM

## 2018-08-13 DIAGNOSIS — T451X5A Adverse effect of antineoplastic and immunosuppressive drugs, initial encounter: Secondary | ICD-10-CM

## 2018-08-13 DIAGNOSIS — K521 Toxic gastroenteritis and colitis: Secondary | ICD-10-CM

## 2018-08-13 MED ORDER — DIPHENOXYLATE-ATROPINE 2.5-0.025 MG PO TABS: 1.0000 | ORAL_TABLET | Freq: Four times a day (QID) | ORAL | 3 refills | Status: DC | PRN

## 2018-08-13 NOTE — Addendum Note (Signed)
Addended by: Donetta Potts on: 08/13/2018 09:16 AM   Modules accepted: Orders

## 2018-08-18 ENCOUNTER — Inpatient Hospital Stay (HOSPITAL_COMMUNITY): Payer: Medicare Other

## 2018-08-18 ENCOUNTER — Encounter (HOSPITAL_COMMUNITY): Payer: Self-pay | Admitting: Hematology

## 2018-08-18 ENCOUNTER — Inpatient Hospital Stay (HOSPITAL_BASED_OUTPATIENT_CLINIC_OR_DEPARTMENT_OTHER): Payer: Medicare Other | Admitting: Hematology

## 2018-08-18 ENCOUNTER — Other Ambulatory Visit: Payer: Self-pay

## 2018-08-18 VITALS — BP 119/55 | HR 52 | Temp 97.8°F | Resp 18

## 2018-08-18 DIAGNOSIS — R197 Diarrhea, unspecified: Secondary | ICD-10-CM

## 2018-08-18 DIAGNOSIS — G629 Polyneuropathy, unspecified: Secondary | ICD-10-CM

## 2018-08-18 DIAGNOSIS — C184 Malignant neoplasm of transverse colon: Secondary | ICD-10-CM

## 2018-08-18 DIAGNOSIS — C786 Secondary malignant neoplasm of retroperitoneum and peritoneum: Secondary | ICD-10-CM | POA: Diagnosis not present

## 2018-08-18 DIAGNOSIS — Z5111 Encounter for antineoplastic chemotherapy: Secondary | ICD-10-CM | POA: Diagnosis not present

## 2018-08-18 DIAGNOSIS — Z95828 Presence of other vascular implants and grafts: Secondary | ICD-10-CM

## 2018-08-18 DIAGNOSIS — R634 Abnormal weight loss: Secondary | ICD-10-CM

## 2018-08-18 DIAGNOSIS — Z79899 Other long term (current) drug therapy: Secondary | ICD-10-CM

## 2018-08-18 LAB — COMPREHENSIVE METABOLIC PANEL
ALT: 17 U/L (ref 0–44)
AST: 22 U/L (ref 15–41)
Albumin: 3.4 g/dL — ABNORMAL LOW (ref 3.5–5.0)
Alkaline Phosphatase: 41 U/L (ref 38–126)
Anion gap: 11 (ref 5–15)
BUN: 17 mg/dL (ref 8–23)
CO2: 24 mmol/L (ref 22–32)
Calcium: 8.9 mg/dL (ref 8.9–10.3)
Chloride: 103 mmol/L (ref 98–111)
Creatinine, Ser: 0.9 mg/dL (ref 0.61–1.24)
GFR calc Af Amer: >60 mL/min (ref >60)
GFR calc non Af Amer: >60 mL/min (ref >60)
Glucose, Bld: 141 mg/dL — ABNORMAL HIGH (ref 70–99)
Potassium: 4.2 mmol/L (ref 3.5–5.1)
Sodium: 138 mmol/L (ref 135–145)
Total Bilirubin: 0.9 mg/dL (ref 0.3–1.2)
Total Protein: 6.4 g/dL — ABNORMAL LOW (ref 6.5–8.1)

## 2018-08-18 LAB — CBC WITH DIFFERENTIAL/PLATELET
Abs Immature Granulocytes: 0.01 10*3/uL (ref 0.00–0.07)
Basophils Absolute: 0 10*3/uL (ref 0.0–0.1)
Basophils Relative: 0 %
Eosinophils Absolute: 0.2 10*3/uL (ref 0.0–0.5)
Eosinophils Relative: 3 %
HCT: 34.4 % — ABNORMAL LOW (ref 39.0–52.0)
Hemoglobin: 10.7 g/dL — ABNORMAL LOW (ref 13.0–17.0)
Immature Granulocytes: 0 %
Lymphocytes Relative: 37 %
Lymphs Abs: 2.4 10*3/uL (ref 0.7–4.0)
MCH: 34.1 pg — ABNORMAL HIGH (ref 26.0–34.0)
MCHC: 31.1 g/dL (ref 30.0–36.0)
MCV: 109.6 fL — ABNORMAL HIGH (ref 80.0–100.0)
Monocytes Absolute: 1 10*3/uL (ref 0.1–1.0)
Monocytes Relative: 16 %
Neutro Abs: 2.8 10*3/uL (ref 1.7–7.7)
Neutrophils Relative %: 44 %
Platelets: 168 10*3/uL (ref 150–400)
RBC: 3.14 MIL/uL — ABNORMAL LOW (ref 4.22–5.81)
RDW: 14.6 % (ref 11.5–15.5)
WBC: 6.4 10*3/uL (ref 4.0–10.5)
nRBC: 0.3 % — ABNORMAL HIGH (ref 0.0–0.2)

## 2018-08-18 MED ORDER — SODIUM CHLORIDE 0.9 % IV SOLN
1920.0000 mg/m2 | INTRAVENOUS | Status: DC
  Administered 2018-08-18: 3950 mg via INTRAVENOUS
  Filled 2018-08-18: qty 79

## 2018-08-18 MED ORDER — IRINOTECAN HCL CHEMO INJECTION 100 MG/5ML
144.0000 mg/m2 | Freq: Once | INTRAVENOUS | Status: AC
  Administered 2018-08-18: 300 mg via INTRAVENOUS
  Filled 2018-08-18: qty 15

## 2018-08-18 MED ORDER — SODIUM CHLORIDE 0.9 % IV SOLN
Freq: Once | INTRAVENOUS | Status: AC
  Administered 2018-08-18: 09:00:00 via INTRAVENOUS

## 2018-08-18 MED ORDER — SODIUM CHLORIDE 0.9% FLUSH
10.0000 mL | INTRAVENOUS | Status: DC | PRN
  Administered 2018-08-18: 10 mL
  Filled 2018-08-18: qty 10

## 2018-08-18 MED ORDER — FLUOROURACIL CHEMO INJECTION 2.5 GM/50ML
320.0000 mg/m2 | Freq: Once | INTRAVENOUS | Status: AC
  Administered 2018-08-18: 650 mg via INTRAVENOUS
  Filled 2018-08-18: qty 13

## 2018-08-18 MED ORDER — SODIUM CHLORIDE 0.9 % IV SOLN
10.0000 mg | Freq: Once | INTRAVENOUS | Status: AC
  Administered 2018-08-18: 10 mg via INTRAVENOUS
  Filled 2018-08-18: qty 1

## 2018-08-18 MED ORDER — PALONOSETRON HCL INJECTION 0.25 MG/5ML
0.2500 mg | Freq: Once | INTRAVENOUS | Status: AC
  Administered 2018-08-18: 0.25 mg via INTRAVENOUS
  Filled 2018-08-18: qty 5

## 2018-08-18 MED ORDER — SODIUM CHLORIDE 0.9 % IV SOLN
400.0000 mg | Freq: Once | INTRAVENOUS | Status: AC
  Administered 2018-08-18: 400 mg via INTRAVENOUS
  Filled 2018-08-18: qty 16

## 2018-08-18 MED ORDER — ATROPINE SULFATE 1 MG/ML IJ SOLN
0.5000 mg | Freq: Once | INTRAMUSCULAR | Status: AC
  Administered 2018-08-18: 0.5 mg via INTRAVENOUS
  Filled 2018-08-18: qty 1

## 2018-08-18 MED ORDER — SODIUM CHLORIDE 0.9 % IV SOLN
INTRAVENOUS | Status: DC
  Administered 2018-08-18: 11:00:00 via INTRAVENOUS

## 2018-08-18 MED ORDER — LEUCOVORIN CALCIUM INJECTION 350 MG
700.0000 mg | Freq: Once | INTRAVENOUS | Status: AC
  Administered 2018-08-18: 700 mg via INTRAVENOUS
  Filled 2018-08-18: qty 35

## 2018-08-18 NOTE — Assessment & Plan Note (Signed)
1.  Stage IV colon cancer (pT3 pN1 M1) MSI -High: -K-ras mutation negative, BRAF V600E positive, PIK3CA, CDKN2A,TP53 - 12 cycles of FOLFOX from 08/26/2016 through 01/27/2017. -Opdivo from 02/23/2017 through 07/13/2017. -FOLFIRI and bevacizumab started on 07/27/2017 - CT CAP on 05/07/2018 shows similar right pelvic nodule and left abdominal omental metastasis.  No progressive disease.  Similar mild thoracic adenopathy.  Similar nonspecific bilateral pulmonary nodules. - Cycle 25 was on 07/05/2018. - CT CAP 07/29/2018: Suggest stable disease with minimal improvement of disease. Also of note, there was interval development of interstitial and ground-glass airspace opacity in the right middle and lower lobes.Features are nonspecific but likely infectious/inflammatory. We will continue to monitor.  - He is tolerating current treatment fairly well. He develops fatigue for 2-3 days after treatment. If side effects progress would consider stopping Irinotecan.  - He will proceed with Cycle 28 today.  RTC in 2 weeks.  2.  Peripheral neuropathy: -He has tingling in the hands and feet which is constant.  He will continue gabapentin 600 mg twice daily.  3.  Diarrhea: -He will continue Imodium and Lomotil twice daily.  He has 2-3 watery bowel movements per day.  4.  Weight loss: -His weight has been stable for the last visit. -She was told to drink Ensure 1 can every day.

## 2018-08-18 NOTE — Patient Instructions (Signed)
[facility] Discharge Instructions for Patients Receiving Chemotherapy   Beginning January 23rd 2017 lab work for the Cancer Center will be done in the  Main lab at  on 1st floor. If you have a lab appointment with the Cancer Center please come in thru the  Main Entrance and check in at the main information desk   Today you received the following chemotherapy agents Avastin,Irinotecan,Leucovorin and 5FU. Follow-up as scheduled. Call clinic for any questions or concerns  To help prevent nausea and vomiting after your treatment, we encourage you to take your nausea medication   If you develop nausea and vomiting, or diarrhea that is not controlled by your medication, call the clinic.  The clinic phone number is [phone]. Office hours are Monday-Friday 8:30am-5:00pm.  BELOW ARE SYMPTOMS THAT SHOULD BE REPORTED IMMEDIATELY:  *FEVER GREATER THAN 101.0 F  *CHILLS WITH OR WITHOUT FEVER  NAUSEA AND VOMITING THAT IS NOT CONTROLLED WITH YOUR NAUSEA MEDICATION  *UNUSUAL SHORTNESS OF BREATH  *UNUSUAL BRUISING OR BLEEDING  TENDERNESS IN MOUTH AND THROAT WITH OR WITHOUT PRESENCE OF ULCERS  *URINARY PROBLEMS  *BOWEL PROBLEMS  UNUSUAL RASH Items with * indicate a potential emergency and should be followed up as soon as possible. If you have an emergency after office hours please contact your primary care physician or go to the nearest emergency department.  Please call the clinic during office hours if you have any questions or concerns.   You may also contact the Patient Navigator at [phone] should you have any questions or need assistance in obtaining follow up care.      Resources For Cancer Patients and their Caregivers ? American Cancer Society: Can assist with transportation, wigs, general needs, runs Look Good Feel Better.        1-888-227-6333 ? Cancer Care: Provides financial assistance, online support groups, medication/co-pay  assistance.  1-800-813-HOPE (4673) ? [facility] Assists [place] cancer patients and their families through emotional , educational and financial support.  336-427-4357 ? [place] DSS Where to apply for food stamps, Medicaid and utility assistance. 336-342-1394 ? [facility]: Transportation to medical appointments. 336-347-2287 ? Social Security Administration: May apply for disability if have a Stage IV cancer. 336-342-7796 1-800-772-1213 ? [place] Aging, Disability and Transit Services: Assists with nutrition, care and transit needs. 336-349-2343         

## 2018-08-18 NOTE — Progress Notes (Signed)
6047 Labs reviewed with and pt seen by Dr. Delton Coombes and pt approved for chemo tx today per MD                                                                                     Wellington Hampshire tolerated chemo tx well without complaints or incident. Pt discharged with 5FU pump infusing without issues. VSS upon discharge. Pt discharged self ambulatory using his cane in satisfactory condition

## 2018-08-18 NOTE — Progress Notes (Signed)
[place], [place] [address]   CLINIC:  Medical Oncology/Hematology  PCP:  Tobe Sos, MD [address] 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[place])    07/27/2017 -  Chemotherapy    The patient had palonosetron (ALOXI) injection 0.25 mg, 0.25 mg, Intravenous,  Once, 28 of 30 cycles Administration: 0.25 mg (07/27/2017), 0.25 mg (08/10/2017), 0.25 mg (08/24/2017), 0.25 mg (09/07/2017), 0.25 mg (09/21/2017), 0.25 mg (10/05/2017), 0.25 mg (10/19/2017), 0.25 mg (11/02/2017), 0.25 mg (11/16/2017), 0.25 mg (11/30/2017), 0.25 mg (12/14/2017), 0.25 mg (12/28/2017), 0.25 mg (01/12/2018), 0.25 mg (01/26/2018), 0.25 mg (02/09/2018), 0.25 mg (02/24/2018), 0.25 mg (03/10/2018), 0.25 mg (03/29/2018), 0.25 mg (04/12/2018), 0.25 mg (04/26/2018), 0.25 mg (05/10/2018), 0.25 mg (05/24/2018), 0.25 mg (06/07/2018), 0.25 mg (06/21/2018), 0.25 mg (07/05/2018), 0.25 mg (07/19/2018), 0.25 mg (08/03/2018), 0.25 mg (08/18/2018) pegfilgrastim-cbqv (UDENYCA) injection 6 mg, 6 mg, Subcutaneous, Once, 7 of 7 cycles Administration: 6 mg (10/07/2017), 6 mg (10/21/2017), 6 mg (11/04/2017), 6 mg (11/18/2017), 6 mg (12/02/2017), 6 mg (12/16/2017), 6 mg (12/30/2017) bevacizumab (AVASTIN) 450 mg in sodium chloride 0.9 % 100 mL chemo infusion, 5 mg/kg = 450 mg, Intravenous,  Once, 28 of 30 cycles Dose modification: 4 mg/kg (80 % of original dose 5 mg/kg, Cycle 5, Reason: Provider Judgment), 5 mg/kg (original dose 5 mg/kg, Cycle 16, Reason: Other (see comments)) Administration: 450 mg (07/27/2017), 450 mg (08/10/2017), 450 mg (08/24/2017), 450 mg (09/07/2017), 450 mg (09/21/2017), 450 mg (10/05/2017), 450 mg (10/19/2017), 450 mg (11/02/2017), 450 mg (11/16/2017), 450 mg (11/30/2017), 450 mg (12/14/2017), 450 mg (12/28/2017), 400 mg  (01/12/2018), 400 mg (01/26/2018), 400 mg (02/09/2018), 400 mg (02/24/2018), 400 mg (03/10/2018), 400 mg (03/29/2018), 400 mg (04/12/2018), 400 mg (04/26/2018), 400 mg (05/10/2018), 400 mg (05/24/2018), 400 mg (06/07/2018), 400 mg (06/21/2018), 400 mg (07/05/2018), 400 mg (07/19/2018), 400 mg (08/03/2018), 400 mg (08/18/2018) irinotecan (CAMPTOSAR) 400 mg in dextrose 5 % 500 mL chemo infusion, 180 mg/m2 = 400 mg, Intravenous,  Once, 28 of 30 cycles Dose modification: 144 mg/m2 (80 % of original dose 180 mg/m2, Cycle 5, Reason: Provider Judgment) Administration: 400 mg (07/27/2017), 400 mg (08/10/2017), 400 mg (08/24/2017), 400 mg (09/07/2017), 320 mg (09/21/2017), 320 mg (10/05/2017), 320 mg (10/19/2017), 320 mg (11/02/2017), 320 mg (11/16/2017), 320 mg (11/30/2017), 320 mg (12/14/2017), 320 mg (12/28/2017), 320 mg (01/12/2018), 320 mg (01/26/2018), 320 mg (02/09/2018), 300 mg (02/24/2018), 300 mg (03/10/2018), 300 mg (03/29/2018), 300 mg (04/12/2018), 300 mg (04/26/2018), 300 mg (05/10/2018), 300 mg (05/24/2018), 300 mg (06/07/2018), 300 mg (06/21/2018), 300 mg (07/05/2018), 300 mg (07/19/2018), 300 mg (08/03/2018), 300 mg (08/18/2018) leucovorin 800 mg in dextrose 5 % 250 mL infusion, 872 mg, Intravenous,  Once, 28 of 30 cycles Dose modification: 320 mg/m2 (80 % of original dose 400 mg/m2, Cycle 5, Reason: Provider Judgment) Administration: 800 mg (07/27/2017), 800 mg (08/10/2017), 800 mg (08/24/2017), 800 mg (09/07/2017), 700 mg (09/21/2017), 700 mg (10/05/2017), 700 mg (10/19/2017), 700 mg (11/02/2017), 700 mg (11/16/2017), 700 mg (11/30/2017), 700 mg (12/14/2017), 700 mg (12/28/2017), 700 mg (01/12/2018), 700 mg (01/26/2018), 700 mg (02/09/2018), 700 mg (02/24/2018), 700 mg (03/10/2018), 700 mg (03/29/2018), 700 mg (04/12/2018), 700 mg (04/26/2018), 700 mg (05/10/2018), 700 mg (05/24/2018), 700 mg (06/07/2018), 700 mg (06/21/2018), 700 mg (07/05/2018), 700 mg (07/19/2018), 700 mg (08/03/2018), 700  mg (08/18/2018) fluorouracil (ADRUCIL) chemo injection 850 mg, 400 mg/m2 = 850 mg,  Intravenous,  Once, 28 of 30 cycles Dose modification: 320 mg/m2 (80 % of original dose 400 mg/m2, Cycle 5, Reason: Provider Judgment) Administration: 850 mg (07/27/2017), 850 mg (08/10/2017), 850 mg (08/24/2017), 850 mg (09/07/2017), 700 mg (09/21/2017), 700 mg (10/05/2017), 700 mg (10/19/2017), 700 mg (11/02/2017), 700 mg (11/16/2017), 700 mg (11/30/2017), 700 mg (12/14/2017), 700 mg (12/28/2017), 700 mg (01/12/2018), 700 mg (01/26/2018), 700 mg (02/09/2018), 650 mg (02/24/2018), 650 mg (03/10/2018), 650 mg (03/29/2018), 650 mg (04/12/2018), 650 mg (04/26/2018), 650 mg (05/10/2018), 650 mg (05/24/2018), 650 mg (06/07/2018), 650 mg (06/21/2018), 650 mg (07/05/2018), 650 mg (07/19/2018), 650 mg (08/03/2018), 650 mg (08/18/2018) fluorouracil (ADRUCIL) 5,250 mg in sodium chloride 0.9 % 145 mL chemo infusion, 2,400 mg/m2 = 5,250 mg, Intravenous, 1 Day/Dose, 28 of 30 cycles Dose modification: 1,920 mg/m2 (80 % of original dose 2,400 mg/m2, Cycle 5, Reason: Provider Judgment) Administration: 5,250 mg (07/27/2017), 5,250 mg (08/10/2017), 5,250 mg (08/24/2017), 5,250 mg (09/07/2017), 4,200 mg (09/21/2017), 4,200 mg (10/05/2017), 4,200 mg (10/19/2017), 4,200 mg (11/02/2017), 4,200 mg (11/16/2017), 4,200 mg (11/30/2017), 4,200 mg (12/14/2017), 4,200 mg (12/28/2017), 4,200 mg (01/12/2018), 4,200 mg (01/26/2018), 4,200 mg (02/09/2018), 3,950 mg (02/24/2018), 3,950 mg (03/10/2018), 3,950 mg (03/29/2018), 3,950 mg (04/12/2018), 3,950 mg (04/26/2018), 3,950 mg (05/10/2018), 3,950 mg (05/24/2018), 3,950 mg (06/07/2018), 3,950 mg (06/21/2018), 3,950 mg (07/05/2018), 3,950 mg (07/19/2018), 3,950 mg (08/03/2018), 3,950 mg (08/18/2018)  for chemotherapy treatment.       CANCER STAGING: Cancer Staging Malignant neoplasm of transverse colon [facility]) Staging form: Colon and Rectum, AJCC 8th Edition - Clinical: cT3, cN1a, cM1 - Signed by Twana First, MD on 03/09/2017 - Pathologic: No stage assigned - Unsigned    INTERVAL HISTORY:  Kevin Kirk 85 y.o. male returns for routine  follow-up and consideration for next cycle of chemotherapy. He is here today alone. He states that he has had no trouble since his last visit. Denies any nausea, vomiting, or diarrhea. Denies any new pains. Had not noticed any recent bleeding such as epistaxis, hematuria or hematochezia. Denies recent chest pain on exertion, shortness of breath on minimal exertion, pre-syncopal episodes, or palpitations. Denies any numbness or tingling in hands or feet. Denies any recent fevers, infections, or recent hospitalizations. Patient reports appetite at 100% and energy level at 100%.      REVIEW OF SYSTEMS:  Review of Systems  All other systems reviewed and are negative.    PAST MEDICAL/SURGICAL HISTORY:  Past Medical History:  Diagnosis Date   Chronic pulmonary embolism ([place]) 60/4540   compliction of the valve replacement surgery   COPD (chronic obstructive pulmonary disease) ([place])    Diabetes ([place]) 04/22/2016   H/O aortic valve replacement 10/2008   Hypercholesteremia    Hypertension    Pulmonary emboli ([place]) 10/2008   Sleep apnea    Past Surgical History:  Procedure Laterality Date   AORTIC VALVE REPLACEMENT     2010   CARDIAC CATHETERIZATION  2010   COLONOSCOPY N/A 06/05/2016   Procedure: COLONOSCOPY;  Surgeon: Rogene Houston, MD;  Location: [facility] ENDO SUITE;  Service: Endoscopy;  Laterality: N/A;   PARTIAL COLECTOMY N/A 07/11/2016   Procedure: PARTIAL COLECTOMY;  Surgeon: Aviva Signs, MD;  Location: [facility] ORS;  Service: General;  Laterality: N/A;   PORTACATH PLACEMENT N/A 08/13/2016   Procedure: INSERTION PORT-A-CATH LEFT SUBCLAVIAN;  Surgeon: Aviva Signs, MD;  Location: [facility] ORS;  Service: General;  Laterality: N/A;   REPLACEMENT TOTAL KNEE  1996   spenectomy     from trauma     SOCIAL HISTORY:  Social History   Socioeconomic History   Marital status: Widowed    Spouse name: Not on file   Number of children: Not on file   Years of education: Not on file    Highest education level: Not on file  Occupational History   Not on file  Social Needs   Financial resource strain: Not on file   Food insecurity:    Worry: Not on file    Inability: Not on file   Transportation needs:    Medical: Not on file    Non-medical: Not on file  Tobacco Use   Smoking status: Former Smoker    Years: 15.00    Types: Cigarettes    Last attempt to quit: 1964    Years since quitting: 56.4   Smokeless tobacco: Never Used  Substance and Sexual Activity   Alcohol use: No   Drug use: No   Sexual activity: Yes  Lifestyle   Physical activity:    Days per week: Not on file    Minutes per session: Not on file   Stress: Not on file  Relationships   Social connections:    Talks on phone: Not on file    Gets together: Not on file    Attends religious service: Not on file    Active member of club or organization: Not on file    Attends meetings of clubs or organizations: Not on file    Relationship status: Not on file   Intimate partner violence:    Fear of current or ex partner: Not on file    Emotionally abused: Not on file    Physically abused: Not on file    Forced sexual activity: Not on file  Other Topics Concern   Not on file  Social History Narrative   Not on file    FAMILY HISTORY:  Family History  Problem Relation Age of Onset   Colon cancer Neg Hx     CURRENT MEDICATIONS:  Outpatient Encounter Medications as of 08/18/2018  Medication Sig Note   atorvastatin (LIPITOR) 10 MG tablet Take 10 mg by mouth at bedtime.     Cholecalciferol (VITAMIN D3) 5000 units CAPS Take 5,000 Units by mouth daily.     diphenoxylate-atropine (LOMOTIL) 2.5-0.025 MG tablet Take 1 tablet by mouth 4 (four) times daily as needed for diarrhea or loose stools.    gabapentin (NEURONTIN) 600 MG tablet Take 1 tablet (600 mg total) by mouth 2 (two) times daily.    lisinopril (PRINIVIL,ZESTRIL) 10 MG tablet Take 10 mg by mouth daily.    metoprolol  (LOPRESSOR) 50 MG tablet Take 50 mg by mouth 2 (two) times daily.    Multiple Vitamins-Minerals (CENTRUM SILVER 50+MEN PO) Take 1 tablet by mouth daily.    ondansetron (ZOFRAN) 4 MG tablet Take 4 mg by mouth 2 (two) times daily. Take one tablet two times a day as needed for nausea.    RELION GLUCOSE TEST STRIPS test strip USE 1 STRIP TO CHECK GLUCOSE ONCE DAILY    vitamin B-12 (CYANOCOBALAMIN) 1000 MCG tablet Take 1,000 mcg by mouth daily.     [DISCONTINUED] prochlorperazine (COMPAZINE) 10 MG tablet Take 1 tablet (10 mg total) by mouth every 6 (six) hours as needed (Nausea or vomiting). 08/05/2016: New Med   Facility-Administered Encounter Medications as of 08/18/2018  Medication   sodium chloride flush (NS) 0.9 % injection 10 mL  ALLERGIES:  Allergies  Allergen Reactions   Amoxicillin Hives    Has patient had a PCN reaction causing immediate rash, facial/tongue/throat swelling, SOB or lightheadedness with hypotension: No Has patient had a PCN reaction causing severe rash involving mucus membranes or skin necrosis: Yes Has patient had a PCN reaction that required hospitalization No Has patient had a PCN reaction occurring within the last 10 years: No If all of the above answers are "NO", then may proceed with Cephalosporin use.    Tamsulosin Hcl     Cant sleep, confusion      PHYSICAL EXAM:  ECOG Performance status: 1  Vitals:   08/18/18 0809  BP: 122/61  Pulse: 61  Resp: 18  Temp: 97.8 F (36.6 C)  SpO2: 98%   Filed Weights   08/18/18 0809  Weight: 170 lb 12.8 oz (77.5 kg)    Physical Exam Vitals signs reviewed.  Constitutional:      Appearance: Normal appearance.  Cardiovascular:     Rate and Rhythm: Normal rate and regular rhythm.     Heart sounds: Normal heart sounds.  Pulmonary:     Effort: Pulmonary effort is normal.     Breath sounds: Normal breath sounds.  Abdominal:     General: There is no distension.     Palpations: Abdomen is soft. There is  no mass.  Musculoskeletal:        General: No swelling.  Skin:    General: Skin is warm.  Neurological:     General: No focal deficit present.     Mental Status: He is alert and oriented to person, place, and time.  Psychiatric:        Mood and Affect: Mood normal.        Behavior: Behavior normal.      LABORATORY DATA:  I have reviewed the labs as listed.  CBC    Component Value Date/Time   WBC 6.4 08/18/2018 0804   RBC 3.14 (L) 08/18/2018 0804   HGB 10.7 (L) 08/18/2018 0804   HCT 34.4 (L) 08/18/2018 0804   PLT 168 08/18/2018 0804   MCV 109.6 (H) 08/18/2018 0804   MCH 34.1 (H) 08/18/2018 0804   MCHC 31.1 08/18/2018 0804   RDW 14.6 08/18/2018 0804   LYMPHSABS 2.4 08/18/2018 0804   MONOABS 1.0 08/18/2018 0804   EOSABS 0.2 08/18/2018 0804   BASOSABS 0.0 08/18/2018 0804   CMP Latest Ref Rng & Units 08/18/2018 08/03/2018 07/19/2018  Glucose 70 - 99 mg/dL 141(H) 147(H) 129(H)  BUN 8 - 23 mg/dL '17 18 13  ' Creatinine 0.61 - 1.24 mg/dL 0.90 0.84 0.84  Sodium 135 - 145 mmol/L 138 139 141  Potassium 3.5 - 5.1 mmol/L 4.2 4.6 4.1  Chloride 98 - 111 mmol/L 103 104 108  CO2 22 - 32 mmol/L '24 27 25  ' Calcium 8.9 - 10.3 mg/dL 8.9 9.0 8.7(L)  Total Protein 6.5 - 8.1 g/dL 6.4(L) 6.7 6.2(L)  Total Bilirubin 0.3 - 1.2 mg/dL 0.9 0.7 0.7  Alkaline Phos 38 - 126 U/L 41 45 41  AST 15 - 41 U/L '22 25 24  ' ALT 0 - 44 U/L '17 22 18       ' DIAGNOSTIC IMAGING:  I have independently reviewed the scans and discussed with the patient.   I have reviewed Venita Lick LPN's note and agree with the documentation.  I personally performed a face-to-face visit, made revisions and my assessment and plan is as follows.    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  Stage IV colon cancer (pT3 pN1 M1) MSI -High: -K-ras mutation negative, BRAF V600E positive, PIK3CA, CDKN2A,TP53 - 12 cycles of FOLFOX from 08/26/2016 through 01/27/2017. -Opdivo from 02/23/2017 through  07/13/2017. -FOLFIRI and bevacizumab started on 07/27/2017 - CT CAP on 05/07/2018 shows similar right pelvic nodule and left abdominal omental metastasis.  No progressive disease.  Similar mild thoracic adenopathy.  Similar nonspecific bilateral pulmonary nodules. - Cycle 25 was on 07/05/2018. - CT CAP 07/29/2018: Suggest stable disease with minimal improvement of disease. Also of note, there was interval development of interstitial and ground-glass airspace opacity in the right middle and lower lobes.Features are nonspecific but likely infectious/inflammatory. We will continue to monitor.  - He is tolerating current treatment fairly well. He develops fatigue for 2-3 days after treatment. If side effects progress would consider stopping Irinotecan.  - He will proceed with Cycle 28 today.  RTC in 2 weeks.  2.  Peripheral neuropathy: -He has tingling in the hands and feet which is constant.  He will continue gabapentin 600 mg twice daily.  3.  Diarrhea: -He will continue Imodium and Lomotil twice daily.  He has 2-3 watery bowel movements per day.  4.  Weight loss: -His weight has been stable for the last visit. -She was told to drink Ensure 1 can every day.     Total time spent is 25 minutes with more than 50% of the time spent face-to-face discussing treatment plan and coordination of care.    Orders placed this encounter:  No orders of the defined types were placed in this encounter.     Derek Jack, MD [place] [phone]

## 2018-08-18 NOTE — Patient Instructions (Addendum)
[facility] at [facility] Discharge Instructions  You were seen today by Dr. Katragadda. He went over your recent lab results. He will see you back in 2 weeks for labs, treatment and follow up.   Thank you for choosing [facility] at [facility] to provide your oncology and hematology care.  To afford each patient quality time with our provider, please arrive at least 15 minutes before your scheduled appointment time.   If you have a lab appointment with the Cancer Center please come in thru the  Main Entrance and check in at the main information desk  You need to re-schedule your appointment should you arrive 10 or more minutes late.  We strive to give you quality time with our providers, and arriving late affects you and other patients whose appointments are after yours.  Also, if you no show three or more times for appointments you may be dismissed from the clinic at the providers discretion.     Again, thank you for choosing [facility].  Our hope is that these requests will decrease the amount of time that you wait before being seen by our physicians.       _____________________________________________________________  Should you have questions after your visit to [facility], please contact our office at [phone] between the hours of 8:00 a.m. and 4:30 p.m.  Voicemails left after 4:00 p.m. will not be returned until the following business day.  For prescription refill requests, have your pharmacy contact our office and allow 72 hours.    Cancer Center Support Programs:   > Cancer Support Group  2nd Tuesday of the month 1pm-2pm, Journey Room    

## 2018-08-20 ENCOUNTER — Inpatient Hospital Stay (HOSPITAL_COMMUNITY): Payer: Medicare Other

## 2018-08-20 ENCOUNTER — Other Ambulatory Visit: Payer: Self-pay

## 2018-08-20 ENCOUNTER — Encounter (HOSPITAL_COMMUNITY): Payer: Self-pay

## 2018-08-20 VITALS — BP 134/66 | HR 58 | Temp 98.1°F | Resp 18

## 2018-08-20 DIAGNOSIS — C184 Malignant neoplasm of transverse colon: Secondary | ICD-10-CM

## 2018-08-20 DIAGNOSIS — Z95828 Presence of other vascular implants and grafts: Secondary | ICD-10-CM

## 2018-08-20 DIAGNOSIS — Z5111 Encounter for antineoplastic chemotherapy: Secondary | ICD-10-CM | POA: Diagnosis not present

## 2018-08-20 MED ORDER — HEPARIN SOD (PORK) LOCK FLUSH 100 UNIT/ML IV SOLN
500.0000 [IU] | Freq: Once | INTRAVENOUS | Status: AC | PRN
  Administered 2018-08-20: 500 [IU]

## 2018-08-20 MED ORDER — SODIUM CHLORIDE 0.9% FLUSH
10.0000 mL | INTRAVENOUS | Status: DC | PRN
  Administered 2018-08-20: 10 mL
  Filled 2018-08-20: qty 10

## 2018-08-20 NOTE — Progress Notes (Signed)
Kevin Kirk tolerated 5FU pump well without complaints or incident. 5FU pump discontinued with portacath flushed easily per protocol then de-accessed. VSS upon discharge. Pt discharged self-ambulatory using his cane in satisfactory condition

## 2018-08-20 NOTE — Patient Instructions (Signed)
[place] at [facility] Discharge Instructions  5FU pump discontinued with portacath flushed per protocol. Follow-up as scheduled. Call clinic for any questions or concerns   Thank you for choosing [place] at [facility] to provide your oncology and hematology care.  To afford each patient quality time with our provider, please arrive at least 15 minutes before your scheduled appointment time.   If you have a lab appointment with the [facility] please come in thru the  Main Entrance and check in at the main information desk  You need to re-schedule your appointment should you arrive 10 or more minutes late.  We strive to give you quality time with our providers, and arriving late affects you and other patients whose appointments are after yours.  Also, if you no show three or more times for appointments you may be dismissed from the clinic at the providers discretion.     Again, thank you for choosing [facility].  Our hope is that these requests will decrease the amount of time that you wait before being seen by our physicians.       _____________________________________________________________  Should you have questions after your visit to [facility], please contact our office at [phone] between the hours of 8:00 a.m. and 4:30 p.m.  Voicemails left after 4:00 p.m. will not be returned until the following business day.  For prescription refill requests, have your pharmacy contact our office and allow 72 hours.    Cancer Center Support Programs:   > Cancer Support Group  2nd Tuesday of the month 1pm-2pm, Journey Room

## 2018-08-31 ENCOUNTER — Other Ambulatory Visit: Payer: Self-pay

## 2018-09-01 ENCOUNTER — Inpatient Hospital Stay (HOSPITAL_COMMUNITY): Payer: Medicare Other | Attending: Hematology

## 2018-09-01 ENCOUNTER — Encounter (HOSPITAL_COMMUNITY): Payer: Self-pay | Admitting: Hematology

## 2018-09-01 ENCOUNTER — Inpatient Hospital Stay (HOSPITAL_BASED_OUTPATIENT_CLINIC_OR_DEPARTMENT_OTHER): Payer: Medicare Other | Admitting: Hematology

## 2018-09-01 ENCOUNTER — Inpatient Hospital Stay (HOSPITAL_COMMUNITY): Payer: Medicare Other

## 2018-09-01 VITALS — BP 126/61 | HR 61 | Temp 97.6°F | Resp 18

## 2018-09-01 DIAGNOSIS — I10 Essential (primary) hypertension: Secondary | ICD-10-CM | POA: Diagnosis not present

## 2018-09-01 DIAGNOSIS — Z87891 Personal history of nicotine dependence: Secondary | ICD-10-CM | POA: Insufficient documentation

## 2018-09-01 DIAGNOSIS — R634 Abnormal weight loss: Secondary | ICD-10-CM

## 2018-09-01 DIAGNOSIS — I2782 Chronic pulmonary embolism: Secondary | ICD-10-CM | POA: Diagnosis not present

## 2018-09-01 DIAGNOSIS — C184 Malignant neoplasm of transverse colon: Secondary | ICD-10-CM

## 2018-09-01 DIAGNOSIS — Z5111 Encounter for antineoplastic chemotherapy: Secondary | ICD-10-CM | POA: Diagnosis not present

## 2018-09-01 DIAGNOSIS — E7800 Pure hypercholesterolemia, unspecified: Secondary | ICD-10-CM | POA: Insufficient documentation

## 2018-09-01 DIAGNOSIS — R197 Diarrhea, unspecified: Secondary | ICD-10-CM | POA: Diagnosis not present

## 2018-09-01 DIAGNOSIS — Z5112 Encounter for antineoplastic immunotherapy: Secondary | ICD-10-CM | POA: Diagnosis present

## 2018-09-01 DIAGNOSIS — Z7901 Long term (current) use of anticoagulants: Secondary | ICD-10-CM | POA: Diagnosis not present

## 2018-09-01 DIAGNOSIS — Z952 Presence of prosthetic heart valve: Secondary | ICD-10-CM | POA: Insufficient documentation

## 2018-09-01 DIAGNOSIS — G4730 Sleep apnea, unspecified: Secondary | ICD-10-CM | POA: Insufficient documentation

## 2018-09-01 DIAGNOSIS — G629 Polyneuropathy, unspecified: Secondary | ICD-10-CM | POA: Diagnosis not present

## 2018-09-01 DIAGNOSIS — E1142 Type 2 diabetes mellitus with diabetic polyneuropathy: Secondary | ICD-10-CM | POA: Diagnosis not present

## 2018-09-01 DIAGNOSIS — Z79899 Other long term (current) drug therapy: Secondary | ICD-10-CM | POA: Diagnosis not present

## 2018-09-01 DIAGNOSIS — Z95828 Presence of other vascular implants and grafts: Secondary | ICD-10-CM

## 2018-09-01 DIAGNOSIS — J449 Chronic obstructive pulmonary disease, unspecified: Secondary | ICD-10-CM | POA: Diagnosis not present

## 2018-09-01 LAB — CBC WITH DIFFERENTIAL/PLATELET
Abs Immature Granulocytes: 0.02 10*3/uL (ref 0.00–0.07)
Basophils Absolute: 0 10*3/uL (ref 0.0–0.1)
Basophils Relative: 0 %
Eosinophils Absolute: 0.1 10*3/uL (ref 0.0–0.5)
Eosinophils Relative: 2 %
HCT: 35.2 % — ABNORMAL LOW (ref 39.0–52.0)
Hemoglobin: 10.9 g/dL — ABNORMAL LOW (ref 13.0–17.0)
Immature Granulocytes: 0 %
Lymphocytes Relative: 37 %
Lymphs Abs: 2.6 10*3/uL (ref 0.7–4.0)
MCH: 33.3 pg (ref 26.0–34.0)
MCHC: 31 g/dL (ref 30.0–36.0)
MCV: 107.6 fL — ABNORMAL HIGH (ref 80.0–100.0)
Monocytes Absolute: 1.1 10*3/uL — ABNORMAL HIGH (ref 0.1–1.0)
Monocytes Relative: 16 %
Neutro Abs: 3.1 10*3/uL (ref 1.7–7.7)
Neutrophils Relative %: 45 %
Platelets: 199 10*3/uL (ref 150–400)
RBC: 3.27 MIL/uL — ABNORMAL LOW (ref 4.22–5.81)
RDW: 14.5 % (ref 11.5–15.5)
WBC: 6.9 10*3/uL (ref 4.0–10.5)
nRBC: 0 % (ref 0.0–0.2)

## 2018-09-01 LAB — COMPREHENSIVE METABOLIC PANEL
ALT: 21 U/L (ref 0–44)
AST: 23 U/L (ref 15–41)
Albumin: 3.5 g/dL (ref 3.5–5.0)
Alkaline Phosphatase: 43 U/L (ref 38–126)
Anion gap: 11 (ref 5–15)
BUN: 20 mg/dL (ref 8–23)
CO2: 25 mmol/L (ref 22–32)
Calcium: 9.3 mg/dL (ref 8.9–10.3)
Chloride: 103 mmol/L (ref 98–111)
Creatinine, Ser: 0.88 mg/dL (ref 0.61–1.24)
GFR calc Af Amer: >60 mL/min (ref >60)
GFR calc non Af Amer: >60 mL/min (ref >60)
Glucose, Bld: 128 mg/dL — ABNORMAL HIGH (ref 70–99)
Potassium: 4.3 mmol/L (ref 3.5–5.1)
Sodium: 139 mmol/L (ref 135–145)
Total Bilirubin: 0.8 mg/dL (ref 0.3–1.2)
Total Protein: 6.6 g/dL (ref 6.5–8.1)

## 2018-09-01 MED ORDER — SODIUM CHLORIDE 0.9 % IV SOLN
1920.0000 mg/m2 | INTRAVENOUS | Status: DC
  Administered 2018-09-01: 3950 mg via INTRAVENOUS
  Filled 2018-09-01: qty 79

## 2018-09-01 MED ORDER — LEUCOVORIN CALCIUM INJECTION 350 MG
700.0000 mg | Freq: Once | INTRAVENOUS | Status: AC
  Administered 2018-09-01: 700 mg via INTRAVENOUS
  Filled 2018-09-01: qty 35

## 2018-09-01 MED ORDER — SODIUM CHLORIDE 0.9% FLUSH
10.0000 mL | INTRAVENOUS | Status: DC | PRN
  Administered 2018-09-01: 10 mL
  Filled 2018-09-01: qty 10

## 2018-09-01 MED ORDER — IRINOTECAN HCL CHEMO INJECTION 100 MG/5ML
144.0000 mg/m2 | Freq: Once | INTRAVENOUS | Status: AC
  Administered 2018-09-01: 300 mg via INTRAVENOUS
  Filled 2018-09-01: qty 15

## 2018-09-01 MED ORDER — SODIUM CHLORIDE 0.9 % IV SOLN
10.0000 mg | Freq: Once | INTRAVENOUS | Status: AC
  Administered 2018-09-01: 10 mg via INTRAVENOUS
  Filled 2018-09-01: qty 10

## 2018-09-01 MED ORDER — SODIUM CHLORIDE 0.9 % IV SOLN
INTRAVENOUS | Status: DC
  Administered 2018-09-01: 09:00:00 via INTRAVENOUS

## 2018-09-01 MED ORDER — ATROPINE SULFATE 1 MG/ML IJ SOLN
0.5000 mg | Freq: Once | INTRAMUSCULAR | Status: AC
  Administered 2018-09-01: 0.5 mg via INTRAVENOUS
  Filled 2018-09-01: qty 1

## 2018-09-01 MED ORDER — SODIUM CHLORIDE 0.9 % IV SOLN
400.0000 mg | Freq: Once | INTRAVENOUS | Status: AC
  Administered 2018-09-01: 400 mg via INTRAVENOUS
  Filled 2018-09-01: qty 16

## 2018-09-01 MED ORDER — PALONOSETRON HCL INJECTION 0.25 MG/5ML
0.2500 mg | Freq: Once | INTRAVENOUS | Status: AC
  Administered 2018-09-01: 0.25 mg via INTRAVENOUS
  Filled 2018-09-01: qty 5

## 2018-09-01 MED ORDER — SODIUM CHLORIDE 0.9 % IV SOLN
Freq: Once | INTRAVENOUS | Status: AC
  Administered 2018-09-01: 09:00:00 via INTRAVENOUS

## 2018-09-01 MED ORDER — FLUOROURACIL CHEMO INJECTION 2.5 GM/50ML
320.0000 mg/m2 | Freq: Once | INTRAVENOUS | Status: AC
  Administered 2018-09-01: 650 mg via INTRAVENOUS
  Filled 2018-09-01: qty 13

## 2018-09-01 NOTE — Assessment & Plan Note (Addendum)
1.  Stage IV colon cancer (pT3 pN1 M1) MSI -High: -K-ras mutation negative, BRAF V600E positive, PIK3CA, CDKN2A,TP53 - 12 cycles of FOLFOX from 08/26/2016 through 01/27/2017. -Opdivo from 02/23/2017 through 07/13/2017. -FOLFIRI and bevacizumab started on 07/27/2017 - Cycle 28 of FOLFIRI and bevacizumab on 08/18/2018. -CT CAP on 07/29/2018 suggest stable disease with minimal improvement. - Other than diarrhea, he tolerated his last cycle very well. -I have reviewed his blood work.  It is adequate to proceed with his next cycle of chemotherapy. -I will evaluate him in 2 weeks for follow-up.   2.  Peripheral neuropathy: -He has tingling in the hands and feet which is constant.  He will continue gabapentin 600 mg twice daily.  3.  Diarrhea: -He will continue Imodium and Lomotil twice daily.  Diarrhea has been stable.  4.  Neuropathy: -He takes gabapentin which is helping.  He has numbness in the fingertips and toes.  4.  Weight loss: -He lost about 3 pounds since last visit. -He was told to drink 1 Ensure every day.  He lives by himself.  His granddaughter will be coming in 2 to 3 weeks to help with his household activities.

## 2018-09-01 NOTE — Progress Notes (Signed)
[place], [place] [address]   CLINIC:  Medical Oncology/Hematology  PCP:  Tobe Sos, MD [address] 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[place])    07/27/2017 -  Chemotherapy    The patient had palonosetron (ALOXI) injection 0.25 mg, 0.25 mg, Intravenous,  Once, 28 of 30 cycles Administration: 0.25 mg (07/27/2017), 0.25 mg (08/10/2017), 0.25 mg (08/24/2017), 0.25 mg (09/07/2017), 0.25 mg (09/21/2017), 0.25 mg (10/05/2017), 0.25 mg (10/19/2017), 0.25 mg (11/02/2017), 0.25 mg (11/16/2017), 0.25 mg (11/30/2017), 0.25 mg (12/14/2017), 0.25 mg (12/28/2017), 0.25 mg (01/12/2018), 0.25 mg (01/26/2018), 0.25 mg (02/09/2018), 0.25 mg (02/24/2018), 0.25 mg (03/10/2018), 0.25 mg (03/29/2018), 0.25 mg (04/12/2018), 0.25 mg (04/26/2018), 0.25 mg (05/10/2018), 0.25 mg (05/24/2018), 0.25 mg (06/07/2018), 0.25 mg (06/21/2018), 0.25 mg (07/05/2018), 0.25 mg (07/19/2018), 0.25 mg (08/03/2018), 0.25 mg (08/18/2018) pegfilgrastim-cbqv (UDENYCA) injection 6 mg, 6 mg, Subcutaneous, Once, 7 of 7 cycles Administration: 6 mg (10/07/2017), 6 mg (10/21/2017), 6 mg (11/04/2017), 6 mg (11/18/2017), 6 mg (12/02/2017), 6 mg (12/16/2017), 6 mg (12/30/2017) bevacizumab (AVASTIN) 450 mg in sodium chloride 0.9 % 100 mL chemo infusion, 5 mg/kg = 450 mg, Intravenous,  Once, 28 of 30 cycles Dose modification: 4 mg/kg (80 % of original dose 5 mg/kg, Cycle 5, Reason: Provider Judgment), 5 mg/kg (original dose 5 mg/kg, Cycle 16, Reason: Other (see comments)) Administration: 450 mg (07/27/2017), 450 mg (08/10/2017), 450 mg (08/24/2017), 450 mg (09/07/2017), 450 mg (09/21/2017), 450 mg (10/05/2017), 450 mg (10/19/2017), 450 mg (11/02/2017), 450 mg (11/16/2017), 450 mg (11/30/2017), 450 mg (12/14/2017), 450 mg (12/28/2017), 400 mg  (01/12/2018), 400 mg (01/26/2018), 400 mg (02/09/2018), 400 mg (02/24/2018), 400 mg (03/10/2018), 400 mg (03/29/2018), 400 mg (04/12/2018), 400 mg (04/26/2018), 400 mg (05/10/2018), 400 mg (05/24/2018), 400 mg (06/07/2018), 400 mg (06/21/2018), 400 mg (07/05/2018), 400 mg (07/19/2018), 400 mg (08/03/2018), 400 mg (08/18/2018) irinotecan (CAMPTOSAR) 400 mg in dextrose 5 % 500 mL chemo infusion, 180 mg/m2 = 400 mg, Intravenous,  Once, 28 of 30 cycles Dose modification: 144 mg/m2 (80 % of original dose 180 mg/m2, Cycle 5, Reason: Provider Judgment) Administration: 400 mg (07/27/2017), 400 mg (08/10/2017), 400 mg (08/24/2017), 400 mg (09/07/2017), 320 mg (09/21/2017), 320 mg (10/05/2017), 320 mg (10/19/2017), 320 mg (11/02/2017), 320 mg (11/16/2017), 320 mg (11/30/2017), 320 mg (12/14/2017), 320 mg (12/28/2017), 320 mg (01/12/2018), 320 mg (01/26/2018), 320 mg (02/09/2018), 300 mg (02/24/2018), 300 mg (03/10/2018), 300 mg (03/29/2018), 300 mg (04/12/2018), 300 mg (04/26/2018), 300 mg (05/10/2018), 300 mg (05/24/2018), 300 mg (06/07/2018), 300 mg (06/21/2018), 300 mg (07/05/2018), 300 mg (07/19/2018), 300 mg (08/03/2018), 300 mg (08/18/2018) leucovorin 800 mg in dextrose 5 % 250 mL infusion, 872 mg, Intravenous,  Once, 28 of 30 cycles Dose modification: 320 mg/m2 (80 % of original dose 400 mg/m2, Cycle 5, Reason: Provider Judgment) Administration: 800 mg (07/27/2017), 800 mg (08/10/2017), 800 mg (08/24/2017), 800 mg (09/07/2017), 700 mg (09/21/2017), 700 mg (10/05/2017), 700 mg (10/19/2017), 700 mg (11/02/2017), 700 mg (11/16/2017), 700 mg (11/30/2017), 700 mg (12/14/2017), 700 mg (12/28/2017), 700 mg (01/12/2018), 700 mg (01/26/2018), 700 mg (02/09/2018), 700 mg (02/24/2018), 700 mg (03/10/2018), 700 mg (03/29/2018), 700 mg (04/12/2018), 700 mg (04/26/2018), 700 mg (05/10/2018), 700 mg (05/24/2018), 700 mg (06/07/2018), 700 mg (06/21/2018), 700 mg (07/05/2018), 700 mg (07/19/2018), 700 mg (08/03/2018), 700  mg (08/18/2018) fluorouracil (ADRUCIL) chemo injection 850 mg, 400 mg/m2 = 850 mg,  Intravenous,  Once, 28 of 30 cycles Dose modification: 320 mg/m2 (80 % of original dose 400 mg/m2, Cycle 5, Reason: Provider Judgment) Administration: 850 mg (07/27/2017), 850 mg (08/10/2017), 850 mg (08/24/2017), 850 mg (09/07/2017), 700 mg (09/21/2017), 700 mg (10/05/2017), 700 mg (10/19/2017), 700 mg (11/02/2017), 700 mg (11/16/2017), 700 mg (11/30/2017), 700 mg (12/14/2017), 700 mg (12/28/2017), 700 mg (01/12/2018), 700 mg (01/26/2018), 700 mg (02/09/2018), 650 mg (02/24/2018), 650 mg (03/10/2018), 650 mg (03/29/2018), 650 mg (04/12/2018), 650 mg (04/26/2018), 650 mg (05/10/2018), 650 mg (05/24/2018), 650 mg (06/07/2018), 650 mg (06/21/2018), 650 mg (07/05/2018), 650 mg (07/19/2018), 650 mg (08/03/2018), 650 mg (08/18/2018) fluorouracil (ADRUCIL) 5,250 mg in sodium chloride 0.9 % 145 mL chemo infusion, 2,400 mg/m2 = 5,250 mg, Intravenous, 1 Day/Dose, 28 of 30 cycles Dose modification: 1,920 mg/m2 (80 % of original dose 2,400 mg/m2, Cycle 5, Reason: Provider Judgment) Administration: 5,250 mg (07/27/2017), 5,250 mg (08/10/2017), 5,250 mg (08/24/2017), 5,250 mg (09/07/2017), 4,200 mg (09/21/2017), 4,200 mg (10/05/2017), 4,200 mg (10/19/2017), 4,200 mg (11/02/2017), 4,200 mg (11/16/2017), 4,200 mg (11/30/2017), 4,200 mg (12/14/2017), 4,200 mg (12/28/2017), 4,200 mg (01/12/2018), 4,200 mg (01/26/2018), 4,200 mg (02/09/2018), 3,950 mg (02/24/2018), 3,950 mg (03/10/2018), 3,950 mg (03/29/2018), 3,950 mg (04/12/2018), 3,950 mg (04/26/2018), 3,950 mg (05/10/2018), 3,950 mg (05/24/2018), 3,950 mg (06/07/2018), 3,950 mg (06/21/2018), 3,950 mg (07/05/2018), 3,950 mg (07/19/2018), 3,950 mg (08/03/2018), 3,950 mg (08/18/2018)  for chemotherapy treatment.       CANCER STAGING: Cancer Staging Malignant neoplasm of transverse colon [facility]) Staging form: Colon and Rectum, AJCC 8th Edition - Clinical: cT3, cN1a, cM1 - Signed by Twana First, MD on 03/09/2017 - Pathologic: No stage assigned - Unsigned    INTERVAL HISTORY:  Mr. Voght 85 y.o. male returns for follow-up of his  next cycle of chemotherapy.  Cycle 28 of FOLFIRI and bevacizumab was given on 08/18/2018.  Has mild diarrhea which is well controlled with Imodium.  Numbness and tingling in the fingertips and toes has been stable.  He ran out of gabapentin but he will pick it up today from the pharmacy.  Denies any nosebleeds or bleeding per rectum.  Denies any abdominal cramping.  Appetite is 100%.  Energy levels are 75%.  Denies any fevers or infections.      REVIEW OF SYSTEMS:  Review of Systems  Gastrointestinal: Positive for diarrhea.  Neurological: Positive for numbness.  All other systems reviewed and are negative.    PAST MEDICAL/SURGICAL HISTORY:  Past Medical History:  Diagnosis Date   Chronic pulmonary embolism ([place]) 05/5007   compliction of the valve replacement surgery   COPD (chronic obstructive pulmonary disease) ([place])    Diabetes ([place]) 04/22/2016   H/O aortic valve replacement 10/2008   Hypercholesteremia    Hypertension    Pulmonary emboli ([place]) 10/2008   Sleep apnea    Past Surgical History:  Procedure Laterality Date   AORTIC VALVE REPLACEMENT     2010   CARDIAC CATHETERIZATION  2010   COLONOSCOPY N/A 06/05/2016   Procedure: COLONOSCOPY;  Surgeon: Rogene Houston, MD;  Location: [facility];  Service: Endoscopy;  Laterality: N/A;   PARTIAL COLECTOMY N/A 07/11/2016   Procedure: PARTIAL COLECTOMY;  Surgeon: Aviva Signs, MD;  Location: [facility] ORS;  Service: General;  Laterality: N/A;   PORTACATH PLACEMENT N/A 08/13/2016   Procedure: INSERTION PORT-A-CATH LEFT SUBCLAVIAN;  Surgeon: Aviva Signs, MD;  Location: [facility] ORS;  Service: General;  Laterality: N/A;   REPLACEMENT  TOTAL KNEE     1996   spenectomy     from trauma     SOCIAL HISTORY:  Social History   Socioeconomic History   Marital status: Widowed    Spouse name: Not on file   Number of children: Not on file   Years of education: Not on file   Highest education level: Not on file  Occupational  History   Not on file  Social Needs   Financial resource strain: Not on file   Food insecurity:    Worry: Not on file    Inability: Not on file   Transportation needs:    Medical: Not on file    Non-medical: Not on file  Tobacco Use   Smoking status: Former Smoker    Years: 15.00    Types: Cigarettes    Last attempt to quit: 1964    Years since quitting: 56.4   Smokeless tobacco: Never Used  Substance and Sexual Activity   Alcohol use: No   Drug use: No   Sexual activity: Yes  Lifestyle   Physical activity:    Days per week: Not on file    Minutes per session: Not on file   Stress: Not on file  Relationships   Social connections:    Talks on phone: Not on file    Gets together: Not on file    Attends religious service: Not on file    Active member of club or organization: Not on file    Attends meetings of clubs or organizations: Not on file    Relationship status: Not on file   Intimate partner violence:    Fear of current or ex partner: Not on file    Emotionally abused: Not on file    Physically abused: Not on file    Forced sexual activity: Not on file  Other Topics Concern   Not on file  Social History Narrative   Not on file    FAMILY HISTORY:  Family History  Problem Relation Age of Onset   Colon cancer Neg Hx     CURRENT MEDICATIONS:  Outpatient Encounter Medications as of 09/01/2018  Medication Sig Note   atorvastatin (LIPITOR) 10 MG tablet Take 10 mg by mouth at bedtime.     Cholecalciferol (VITAMIN D3) 5000 units CAPS Take 5,000 Units by mouth daily.     diphenoxylate-atropine (LOMOTIL) 2.5-0.025 MG tablet Take 1 tablet by mouth 4 (four) times daily as needed for diarrhea or loose stools.    gabapentin (NEURONTIN) 600 MG tablet Take 1 tablet (600 mg total) by mouth 2 (two) times daily.    lisinopril (PRINIVIL,ZESTRIL) 10 MG tablet Take 10 mg by mouth daily.    metoprolol (LOPRESSOR) 50 MG tablet Take 50 mg by mouth 2 (two)  times daily.    Multiple Vitamins-Minerals (CENTRUM SILVER 50+MEN PO) Take 1 tablet by mouth daily.    ondansetron (ZOFRAN) 4 MG tablet Take 4 mg by mouth 2 (two) times daily. Take one tablet two times a day as needed for nausea.    RELION GLUCOSE TEST STRIPS test strip USE 1 STRIP TO CHECK GLUCOSE ONCE DAILY    vitamin B-12 (CYANOCOBALAMIN) 1000 MCG tablet Take 1,000 mcg by mouth daily.     [DISCONTINUED] prochlorperazine (COMPAZINE) 10 MG tablet Take 1 tablet (10 mg total) by mouth every 6 (six) hours as needed (Nausea or vomiting). 08/05/2016: New Med   Facility-Administered Encounter Medications as of 09/01/2018  Medication   sodium chloride flush (  NS) 0.9 % injection 10 mL    ALLERGIES:  Allergies  Allergen Reactions   Amoxicillin Hives    Has patient had a PCN reaction causing immediate rash, facial/tongue/throat swelling, SOB or lightheadedness with hypotension: No Has patient had a PCN reaction causing severe rash involving mucus membranes or skin necrosis: Yes Has patient had a PCN reaction that required hospitalization No Has patient had a PCN reaction occurring within the last 10 years: No If all of the above answers are "NO", then may proceed with Cephalosporin use.    Tamsulosin Hcl     Cant sleep, confusion      PHYSICAL EXAM:  ECOG Performance status: 1  Vitals:   09/01/18 0800  BP: 130/63  Pulse: 66  Resp: 18  Temp: 98 F (36.7 C)  SpO2: 99%   Filed Weights   09/01/18 0800  Weight: 167 lb 8 oz (76 kg)    Physical Exam Vitals signs reviewed.  Constitutional:      Appearance: Normal appearance.  Cardiovascular:     Rate and Rhythm: Normal rate and regular rhythm.     Heart sounds: Normal heart sounds.  Pulmonary:     Effort: Pulmonary effort is normal.     Breath sounds: Normal breath sounds.  Abdominal:     General: There is no distension.     Palpations: Abdomen is soft. There is no mass.  Musculoskeletal:        General: No swelling.    Skin:    General: Skin is warm.  Neurological:     General: No focal deficit present.     Mental Status: He is alert and oriented to person, place, and time.  Psychiatric:        Mood and Affect: Mood normal.        Behavior: Behavior normal.      LABORATORY DATA:  I have reviewed the labs as listed.  CBC    Component Value Date/Time   WBC 6.9 09/01/2018 0807   RBC 3.27 (L) 09/01/2018 0807   HGB 10.9 (L) 09/01/2018 0807   HCT 35.2 (L) 09/01/2018 0807   PLT 199 09/01/2018 0807   MCV 107.6 (H) 09/01/2018 0807   MCH 33.3 09/01/2018 0807   MCHC 31.0 09/01/2018 0807   RDW 14.5 09/01/2018 0807   LYMPHSABS 2.6 09/01/2018 0807   MONOABS 1.1 (H) 09/01/2018 0807   EOSABS 0.1 09/01/2018 0807   BASOSABS 0.0 09/01/2018 0807   CMP Latest Ref Rng & Units 09/01/2018 08/18/2018 08/03/2018  Glucose 70 - 99 mg/dL 128(H) 141(H) 147(H)  BUN 8 - 23 mg/dL '20 17 18  ' Creatinine 0.61 - 1.24 mg/dL 0.88 0.90 0.84  Sodium 135 - 145 mmol/L 139 138 139  Potassium 3.5 - 5.1 mmol/L 4.3 4.2 4.6  Chloride 98 - 111 mmol/L 103 103 104  CO2 22 - 32 mmol/L '25 24 27  ' Calcium 8.9 - 10.3 mg/dL 9.3 8.9 9.0  Total Protein 6.5 - 8.1 g/dL 6.6 6.4(L) 6.7  Total Bilirubin 0.3 - 1.2 mg/dL 0.8 0.9 0.7  Alkaline Phos 38 - 126 U/L 43 41 45  AST 15 - 41 U/L '23 22 25  ' ALT 0 - 44 U/L '21 17 22       ' DIAGNOSTIC IMAGING:  I have independently reviewed the scans and discussed with the patient.   I have reviewed Venita Lick LPN's note and agree with the documentation.  I personally performed a face-to-face visit, made revisions and my assessment and plan is  as follows.    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  Stage IV colon cancer (pT3 pN1 M1) MSI -High: -K-ras mutation negative, BRAF V600E positive, PIK3CA, CDKN2A,TP53 - 12 cycles of FOLFOX from 08/26/2016 through 01/27/2017. -Opdivo from 02/23/2017 through 07/13/2017. -FOLFIRI and bevacizumab started on 07/27/2017 - Cycle 28 of  FOLFIRI and bevacizumab on 08/18/2018. -CT CAP on 07/29/2018 suggest stable disease with minimal improvement. - Other than diarrhea, he tolerated his last cycle very well. -I have reviewed his blood work.  It is adequate to proceed with his next cycle of chemotherapy. -I will evaluate him in 2 weeks for follow-up.   2.  Peripheral neuropathy: -He has tingling in the hands and feet which is constant.  He will continue gabapentin 600 mg twice daily.  3.  Diarrhea: -He will continue Imodium and Lomotil twice daily.  Diarrhea has been stable.  4.  Neuropathy: -He takes gabapentin which is helping.  He has numbness in the fingertips and toes.  4.  Weight loss: -He lost about 3 pounds since last visit. -He was told to drink 1 Ensure every day.  He lives by himself.  His granddaughter will be coming in 2 to 3 weeks to help with his household activities.      Total time spent is 25 minutes with more than 50% of the time spent face-to-face discussing treatment plan and coordination of care.    Orders placed this encounter:  No orders of the defined types were placed in this encounter.     Derek Jack, MD [place] [phone]

## 2018-09-01 NOTE — Progress Notes (Signed)
To treatment area for oncology follow up and chemotherapy.  Patient stated his diarrhea was better and using imodium for control.  No changes in neuropathy.  No s/s of distress noted.    Patient tolerated chemotherapy with no complaints voiced.  Port site clean and dry with no bruising or swelling noted at site.  Good blood return noted before and after administration of chemotherapy.  Chemo pump connected with no alarms noted.  Patient left ambulatory with VSS and no s/s of distress noted.

## 2018-09-01 NOTE — Patient Instructions (Addendum)
[facility] at [facility] Discharge Instructions  You were seen today by Dr. Katragadda. He went over your recent lab results. He will see you back in 2 weeks for labs and follow up.   Thank you for choosing [facility] at [facility] to provide your oncology and hematology care.  To afford each patient quality time with our provider, please arrive at least 15 minutes before your scheduled appointment time.   If you have a lab appointment with the Cancer Center please come in thru the  Main Entrance and check in at the main information desk  You need to re-schedule your appointment should you arrive 10 or more minutes late.  We strive to give you quality time with our providers, and arriving late affects you and other patients whose appointments are after yours.  Also, if you no show three or more times for appointments you may be dismissed from the clinic at the providers discretion.     Again, thank you for choosing [facility].  Our hope is that these requests will decrease the amount of time that you wait before being seen by our physicians.       _____________________________________________________________  Should you have questions after your visit to  Cancer Center, please contact our office at [phone] between the hours of 8:00 a.m. and 4:30 p.m.  Voicemails left after 4:00 p.m. will not be returned until the following business day.  For prescription refill requests, have your pharmacy contact our office and allow 72 hours.    Cancer Center Support Programs:   > Cancer Support Group  2nd Tuesday of the month 1pm-2pm, Journey Room    

## 2018-09-02 ENCOUNTER — Other Ambulatory Visit: Payer: Self-pay

## 2018-09-03 ENCOUNTER — Inpatient Hospital Stay (HOSPITAL_COMMUNITY): Payer: Medicare Other

## 2018-09-03 VITALS — BP 136/64 | HR 59 | Temp 97.9°F | Resp 18

## 2018-09-03 DIAGNOSIS — C184 Malignant neoplasm of transverse colon: Secondary | ICD-10-CM

## 2018-09-03 DIAGNOSIS — Z5111 Encounter for antineoplastic chemotherapy: Secondary | ICD-10-CM | POA: Diagnosis not present

## 2018-09-03 DIAGNOSIS — Z95828 Presence of other vascular implants and grafts: Secondary | ICD-10-CM

## 2018-09-03 MED ORDER — HEPARIN SOD (PORK) LOCK FLUSH 100 UNIT/ML IV SOLN
500.0000 [IU] | Freq: Once | INTRAVENOUS | Status: AC | PRN
  Administered 2018-09-03: 500 [IU]

## 2018-09-03 MED ORDER — SODIUM CHLORIDE 0.9% FLUSH
10.0000 mL | INTRAVENOUS | Status: DC | PRN
  Administered 2018-09-03: 10 mL
  Filled 2018-09-03: qty 10

## 2018-09-03 NOTE — Progress Notes (Signed)
Kevin Kirk returns today for port de access and flush after 46 hr continous infusion of 54fu. Tolerated infusion without problems. Portacath located left chest wall was  deaccessed and flushed with 39ml NS and 500U/34ml Heparin and needle removed intact.  Procedure without incident. Patient tolerated procedure well.  Vitals stable and discharged home from clinic ambulatory. Follow up as scheduled.

## 2018-09-15 ENCOUNTER — Other Ambulatory Visit: Payer: Self-pay

## 2018-09-15 ENCOUNTER — Inpatient Hospital Stay (HOSPITAL_COMMUNITY): Payer: Medicare Other

## 2018-09-15 ENCOUNTER — Inpatient Hospital Stay (HOSPITAL_BASED_OUTPATIENT_CLINIC_OR_DEPARTMENT_OTHER): Payer: Medicare Other | Admitting: Hematology

## 2018-09-15 ENCOUNTER — Encounter (HOSPITAL_COMMUNITY): Payer: Self-pay | Admitting: Hematology

## 2018-09-15 ENCOUNTER — Encounter (HOSPITAL_COMMUNITY): Payer: Self-pay

## 2018-09-15 VITALS — BP 144/61 | HR 57 | Temp 97.5°F | Resp 16

## 2018-09-15 VITALS — BP 126/66 | HR 79 | Temp 97.9°F | Resp 18 | Wt 170.2 lb

## 2018-09-15 DIAGNOSIS — C184 Malignant neoplasm of transverse colon: Secondary | ICD-10-CM

## 2018-09-15 DIAGNOSIS — Z5111 Encounter for antineoplastic chemotherapy: Secondary | ICD-10-CM | POA: Diagnosis not present

## 2018-09-15 DIAGNOSIS — R197 Diarrhea, unspecified: Secondary | ICD-10-CM | POA: Diagnosis not present

## 2018-09-15 DIAGNOSIS — Z95828 Presence of other vascular implants and grafts: Secondary | ICD-10-CM

## 2018-09-15 LAB — COMPREHENSIVE METABOLIC PANEL
ALT: 14 U/L (ref 0–44)
AST: 20 U/L (ref 15–41)
Albumin: 3.4 g/dL — ABNORMAL LOW (ref 3.5–5.0)
Alkaline Phosphatase: 44 U/L (ref 38–126)
Anion gap: 9 (ref 5–15)
BUN: 15 mg/dL (ref 8–23)
CO2: 27 mmol/L (ref 22–32)
Calcium: 9 mg/dL (ref 8.9–10.3)
Chloride: 105 mmol/L (ref 98–111)
Creatinine, Ser: 0.91 mg/dL (ref 0.61–1.24)
GFR calc Af Amer: >60 mL/min (ref >60)
GFR calc non Af Amer: >60 mL/min (ref >60)
Glucose, Bld: 116 mg/dL — ABNORMAL HIGH (ref 70–99)
Potassium: 4 mmol/L (ref 3.5–5.1)
Sodium: 141 mmol/L (ref 135–145)
Total Bilirubin: 1.1 mg/dL (ref 0.3–1.2)
Total Protein: 6.4 g/dL — ABNORMAL LOW (ref 6.5–8.1)

## 2018-09-15 LAB — URINALYSIS, DIPSTICK ONLY
Bilirubin Urine: NEGATIVE
Glucose, UA: NEGATIVE mg/dL
Hgb urine dipstick: NEGATIVE
Ketones, ur: NEGATIVE mg/dL
Leukocytes,Ua: NEGATIVE
Nitrite: NEGATIVE
Protein, ur: NEGATIVE mg/dL
Specific Gravity, Urine: 1.021 (ref 1.005–1.030)
pH: 5 (ref 5.0–8.0)

## 2018-09-15 LAB — CBC WITH DIFFERENTIAL/PLATELET
Abs Immature Granulocytes: 0.02 10*3/uL (ref 0.00–0.07)
Basophils Absolute: 0 10*3/uL (ref 0.0–0.1)
Basophils Relative: 1 %
Eosinophils Absolute: 0.2 10*3/uL (ref 0.0–0.5)
Eosinophils Relative: 2 %
HCT: 33.4 % — ABNORMAL LOW (ref 39.0–52.0)
Hemoglobin: 10.5 g/dL — ABNORMAL LOW (ref 13.0–17.0)
Immature Granulocytes: 0 %
Lymphocytes Relative: 35 %
Lymphs Abs: 2.8 10*3/uL (ref 0.7–4.0)
MCH: 33.9 pg (ref 26.0–34.0)
MCHC: 31.4 g/dL (ref 30.0–36.0)
MCV: 107.7 fL — ABNORMAL HIGH (ref 80.0–100.0)
Monocytes Absolute: 1.3 10*3/uL — ABNORMAL HIGH (ref 0.1–1.0)
Monocytes Relative: 16 %
Neutro Abs: 3.6 10*3/uL (ref 1.7–7.7)
Neutrophils Relative %: 46 %
Platelets: 180 10*3/uL (ref 150–400)
RBC: 3.1 MIL/uL — ABNORMAL LOW (ref 4.22–5.81)
RDW: 15 % (ref 11.5–15.5)
WBC: 7.9 10*3/uL (ref 4.0–10.5)
nRBC: 0 % (ref 0.0–0.2)

## 2018-09-15 MED ORDER — PALONOSETRON HCL INJECTION 0.25 MG/5ML
0.2500 mg | Freq: Once | INTRAVENOUS | Status: AC
  Administered 2018-09-15: 0.25 mg via INTRAVENOUS

## 2018-09-15 MED ORDER — LEUCOVORIN CALCIUM INJECTION 350 MG
700.0000 mg | Freq: Once | INTRAVENOUS | Status: AC
  Administered 2018-09-15: 700 mg via INTRAVENOUS
  Filled 2018-09-15: qty 35

## 2018-09-15 MED ORDER — ATROPINE SULFATE 1 MG/ML IJ SOLN
0.5000 mg | Freq: Once | INTRAMUSCULAR | Status: AC
  Administered 2018-09-15: 0.5 mg via INTRAVENOUS

## 2018-09-15 MED ORDER — ATROPINE SULFATE 1 MG/ML IJ SOLN
INTRAMUSCULAR | Status: AC
  Filled 2018-09-15: qty 1

## 2018-09-15 MED ORDER — SODIUM CHLORIDE 0.9% FLUSH: 10.0000 mL | INTRAVENOUS | Status: DC | PRN

## 2018-09-15 MED ORDER — SODIUM CHLORIDE 0.9 % IV SOLN
Freq: Once | INTRAVENOUS | Status: AC
  Administered 2018-09-15: 10:00:00 via INTRAVENOUS

## 2018-09-15 MED ORDER — IRINOTECAN HCL CHEMO INJECTION 100 MG/5ML
135.0000 mg/m2 | Freq: Once | INTRAVENOUS | Status: AC
  Administered 2018-09-15: 280 mg via INTRAVENOUS
  Filled 2018-09-15: qty 10

## 2018-09-15 MED ORDER — PALONOSETRON HCL INJECTION 0.25 MG/5ML
INTRAVENOUS | Status: AC
  Filled 2018-09-15: qty 5

## 2018-09-15 MED ORDER — SODIUM CHLORIDE 0.9 % IV SOLN
1920.0000 mg/m2 | INTRAVENOUS | Status: DC
  Administered 2018-09-15: 3950 mg via INTRAVENOUS
  Filled 2018-09-15: qty 79

## 2018-09-15 MED ORDER — FLUOROURACIL CHEMO INJECTION 2.5 GM/50ML
320.0000 mg/m2 | Freq: Once | INTRAVENOUS | Status: AC
  Administered 2018-09-15: 650 mg via INTRAVENOUS
  Filled 2018-09-15: qty 13

## 2018-09-15 MED ORDER — SODIUM CHLORIDE 0.9 % IV SOLN
10.0000 mg | Freq: Once | INTRAVENOUS | Status: AC
  Administered 2018-09-15: 10 mg via INTRAVENOUS
  Filled 2018-09-15: qty 10

## 2018-09-15 MED ORDER — SODIUM CHLORIDE 0.9 % IV SOLN
400.0000 mg | Freq: Once | INTRAVENOUS | Status: AC
  Administered 2018-09-15: 400 mg via INTRAVENOUS
  Filled 2018-09-15: qty 16

## 2018-09-15 NOTE — Assessment & Plan Note (Signed)
1.  Stage IV colon cancer (pT3 pN1 M1) MSI -High: -K-ras mutation negative, BRAF V600E positive, PIK3CA, CDKN2A,TP53 - 12 cycles of FOLFOX from 08/26/2016 through 01/27/2017. -Opdivo from 02/23/2017 through 07/13/2017. -FOLFIRI and bevacizumab started on 07/27/2017 - Cycle 29 of FOLFIRI and bevacizumab on 09/01/2018. -CT CAP on 07/29/2018 suggest stable disease with minimal improvement.  Last CEA is 8.5. - He had diarrhea 3 days after his last chemotherapy, lasting about 2 days.  I have told him to increase Lomotil to 2 tablets 3 times a day when he has diarrhea. -He was using a tablespoon of flour to help with the diarrhea.  I will cut back on the dose of Irinotecan. -I will see him back in 2 weeks for follow-up.   2.  Peripheral neuropathy: -He has tingling and numbness in the feet which is constant.  We will continue gabapentin 600 mg twice daily.  3.  Diarrhea: -I have told him to increase Lomotil to 2 tablets 3 times a day on the days of diarrhea.  4.  Weight loss: -He gained 3 pounds since last visit. - His granddaughter reportedly will come in July to help with cooking and household activities.  His appetite has been good.

## 2018-09-15 NOTE — Progress Notes (Signed)
Pt presents today for treatment and office visit. VS within parameters for tx. Labs pending. MAR reviewed and updated.    Message received from RNester NP to proceed with treatment. Labs within parameters for tx.   Treatment given today per MD orders. Tolerated infusion without adverse affects. Vital signs stable. No complaints at this time. Discharged from clinic ambulatory. 5FU pump on and RUN noted on screen.  F/U with [facility] as scheduled.

## 2018-09-15 NOTE — Progress Notes (Signed)
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25 mg (05/10/2018), 0.25 mg (05/24/2018), 0.25 mg (06/07/2018), 0.25 mg (06/21/2018), 0.25 mg (07/05/2018), 0.25 mg (07/19/2018), 0.25 mg (08/03/2018), 0.25 mg (08/18/2018), 0.25 mg (09/01/2018) pegfilgrastim-cbqv (UDENYCA) injection 6 mg, 6 mg, Subcutaneous, Once, 7 of 7 cycles Administration: 6 mg (10/07/2017), 6 mg (10/21/2017), 6 mg (11/04/2017), 6 mg (11/18/2017), 6 mg (12/02/2017), 6 mg (12/16/2017), 6 mg (12/30/2017) bevacizumab (AVASTIN) 450 mg in sodium chloride 0.9 % 100 mL chemo infusion, 5 mg/kg = 450 mg, Intravenous,  Once, 30 of 33 cycles Dose modification: 4 mg/kg (80 % of original dose 5 mg/kg, Cycle 5, Reason: Provider Judgment), 5 mg/kg (original dose 5 mg/kg, Cycle 16, Reason: Other (see comments)) Administration: 450 mg (07/27/2017), 450 mg (08/10/2017), 450 mg (08/24/2017), 450 mg (09/07/2017), 450 mg (09/21/2017), 450 mg (10/05/2017), 450 mg (10/19/2017), 450 mg (11/02/2017), 450 mg (11/16/2017), 450 mg (11/30/2017), 450 mg (12/14/2017), 450 mg  (12/28/2017), 400 mg (01/12/2018), 400 mg (01/26/2018), 400 mg (02/09/2018), 400 mg (02/24/2018), 400 mg (03/10/2018), 400 mg (03/29/2018), 400 mg (04/12/2018), 400 mg (04/26/2018), 400 mg (05/10/2018), 400 mg (05/24/2018), 400 mg (06/07/2018), 400 mg (06/21/2018), 400 mg (07/05/2018), 400 mg (07/19/2018), 400 mg (08/03/2018), 400 mg (08/18/2018), 400 mg (09/01/2018) irinotecan (CAMPTOSAR) 400 mg in dextrose 5 % 500 mL chemo infusion, 180 mg/m2 = 400 mg, Intravenous,  Once, 30 of 33 cycles Dose modification: 144 mg/m2 (80 % of original dose 180 mg/m2, Cycle 5, Reason: Provider Judgment), 135 mg/m2 (75 % of original dose 180 mg/m2, Cycle 30, Reason: Other (see comments), Comment: diarrhea) Administration: 400 mg (07/27/2017), 400 mg (08/10/2017), 400 mg (08/24/2017), 400 mg (09/07/2017), 320 mg (09/21/2017), 320 mg (10/05/2017), 320 mg (10/19/2017), 320 mg (11/02/2017), 320 mg (11/16/2017), 320 mg (11/30/2017), 320 mg (12/14/2017), 320 mg (12/28/2017), 320 mg (01/12/2018), 320 mg (01/26/2018), 320 mg (02/09/2018), 300 mg (02/24/2018), 300 mg (03/10/2018), 300 mg (03/29/2018), 300 mg (04/12/2018), 300 mg (04/26/2018), 300 mg (05/10/2018), 300 mg (05/24/2018), 300 mg (06/07/2018), 300 mg (06/21/2018), 300 mg (07/05/2018), 300 mg (07/19/2018), 300 mg (08/03/2018), 300 mg (08/18/2018), 300 mg (09/01/2018) leucovorin 800 mg in dextrose 5 % 250 mL infusion, 872 mg, Intravenous,  Once, 30 of 33 cycles Dose modification: 320 mg/m2 (80 % of original dose 400 mg/m2, Cycle 5, Reason: Provider Judgment) Administration: 800 mg (07/27/2017), 800 mg (08/10/2017), 800 mg (08/24/2017), 800 mg (09/07/2017), 700 mg (09/21/2017), 700 mg (10/05/2017), 700 mg (10/19/2017), 700 mg (11/02/2017), 700 mg (11/16/2017), 700 mg (11/30/2017), 700 mg (12/14/2017), 700 mg (12/28/2017), 700 mg (01/12/2018), 700 mg (01/26/2018), 700 mg (02/09/2018), 700 mg (02/24/2018), 700 mg (03/10/2018), 700 mg (03/29/2018), 700 mg (04/12/2018), 700 mg (  04/26/2018), 700 mg (05/10/2018), 700 mg (05/24/2018), 700 mg (06/07/2018), 700 mg  (06/21/2018), 700 mg (07/05/2018), 700 mg (07/19/2018), 700 mg (08/03/2018), 700 mg (08/18/2018), 700 mg (09/01/2018) fluorouracil (ADRUCIL) chemo injection 850 mg, 400 mg/m2 = 850 mg, Intravenous,  Once, 30 of 33 cycles Dose modification: 320 mg/m2 (80 % of original dose 400 mg/m2, Cycle 5, Reason: Provider Judgment) Administration: 850 mg (07/27/2017), 850 mg (08/10/2017), 850 mg (08/24/2017), 850 mg (09/07/2017), 700 mg (09/21/2017), 700 mg (10/05/2017), 700 mg (10/19/2017), 700 mg (11/02/2017), 700 mg (11/16/2017), 700 mg (11/30/2017), 700 mg (12/14/2017), 700 mg (12/28/2017), 700 mg (01/12/2018), 700 mg (01/26/2018), 700 mg (02/09/2018), 650 mg (02/24/2018), 650 mg (03/10/2018), 650 mg (03/29/2018), 650 mg (04/12/2018), 650 mg (04/26/2018), 650 mg (05/10/2018), 650 mg (05/24/2018), 650 mg (06/07/2018), 650 mg (06/21/2018), 650 mg (07/05/2018), 650 mg (07/19/2018), 650 mg (08/03/2018), 650 mg (08/18/2018), 650 mg (09/01/2018) fluorouracil (ADRUCIL) 5,250 mg in sodium chloride 0.9 % 145 mL chemo infusion, 2,400 mg/m2 = 5,250 mg, Intravenous, 1 Day/Dose, 30 of 33 cycles Dose modification: 1,920 mg/m2 (80 % of original dose 2,400 mg/m2, Cycle 5, Reason: Provider Judgment) Administration: 5,250 mg (07/27/2017), 5,250 mg (08/10/2017), 5,250 mg (08/24/2017), 5,250 mg (09/07/2017), 4,200 mg (09/21/2017), 4,200 mg (10/05/2017), 4,200 mg (10/19/2017), 4,200 mg (11/02/2017), 4,200 mg (11/16/2017), 4,200 mg (11/30/2017), 4,200 mg (12/14/2017), 4,200 mg (12/28/2017), 4,200 mg (01/12/2018), 4,200 mg (01/26/2018), 4,200 mg (02/09/2018), 3,950 mg (02/24/2018), 3,950 mg (03/10/2018), 3,950 mg (03/29/2018), 3,950 mg (04/12/2018), 3,950 mg (04/26/2018), 3,950 mg (05/10/2018), 3,950 mg (05/24/2018), 3,950 mg (06/07/2018), 3,950 mg (06/21/2018), 3,950 mg (07/05/2018), 3,950 mg (07/19/2018), 3,950 mg (08/03/2018), 3,950 mg (08/18/2018), 3,950 mg (09/01/2018)  for chemotherapy treatment.       CANCER STAGING: Cancer Staging Malignant neoplasm of transverse colon [facility]) Staging form: Colon  and Rectum, AJCC 8th Edition - Clinical: cT3, cN1a, cM1 - Signed by Twana First, MD on 03/09/2017 - Pathologic: No stage assigned - Unsigned    INTERVAL HISTORY:  Kevin Kirk 85 y.o. male seen for follow-up and next cycle of chemotherapy.  Cycle 29 chemotherapy was on 09/01/2018.  About 3 days after his last cycle of chemotherapy, he had slightly worsening of diarrhea for 2 days.  He was taking Lomotil 1 tablet 3 times a day during that time.  He had to take 1 tablespoon of flour with water to improve diarrhea.  His appetite has been good at 100%.  Energy levels are 75%.  Numbness in the extremities has been stable.  Denies any fevers or infections.       REVIEW OF SYSTEMS:  Review of Systems  Constitutional: Positive for fatigue.  Gastrointestinal: Positive for diarrhea.  Neurological: Positive for numbness.  All other systems reviewed and are negative.    PAST MEDICAL/SURGICAL HISTORY:  Past Medical History:  Diagnosis Date   Chronic pulmonary embolism ([place]) 78/2956   compliction of the valve replacement surgery   COPD (chronic obstructive pulmonary disease) ([place])    Diabetes ([place]) 04/22/2016   H/O aortic valve replacement 10/2008   Hypercholesteremia    Hypertension    Pulmonary emboli ([place]) 10/2008   Sleep apnea    Past Surgical History:  Procedure Laterality Date   AORTIC VALVE REPLACEMENT     2010   CARDIAC CATHETERIZATION  2010   COLONOSCOPY N/A 06/05/2016   Procedure: COLONOSCOPY;  Surgeon: Rogene [place], MD;  Location: [facility] ENDO SU[facility];  Service: Endoscopy;  Laterality: N/A;   PARTIAL COLECTOMY N/A 07/11/2016   Procedure: PARTIAL COLECTOMY;  Surgeon: Aviva Signs, MD;  Location: [facility] ORS;  Service: General;  Laterality: N/A;   PORTACATH PLACEMENT N/A 08/13/2016   Procedure: INSERTION PORT-A-CATH LEFT SUBCLAVIAN;  Surgeon: Aviva Signs, MD;  Location: [facility] ORS;  Service: General;  Laterality: N/A;   REPLACEMENT TOTAL KNEE     1996   spenectomy     from  trauma     SOCIAL HISTORY:  Social History   Socioeconomic History   Marital status: Widowed    Spouse name: Not on file   Number of children: Not on file   Years of education: Not on file   Highest education level: Not on file  Occupational History   Not on file  Social Needs   Financial resource strain: Not on file   Food insecurity    Worry: Not on file    Inability: Not on file   Transportation needs    Medical: Not on file    Non-medical: Not on file  Tobacco Use   Smoking status: Former Smoker    Years: 15.00    Types: Cigarettes    Quit date: 1964    Years since quitting: 56.5   Smokeless tobacco: Never Used  Substance and Sexual Activity   Alcohol use: No   Drug use: No   Sexual activity: Yes  Lifestyle   Physical activity    Days per week: Not on file    Minutes per session: Not on file   Stress: Not on file  Relationships   Social connections    Talks on phone: Not on file    Gets together: Not on file    Attends religious service: Not on file    Active member of club or organization: Not on file    Attends meetings of clubs or organizations: Not on file    Relationship status: Not on file   Intimate partner violence    Fear of current or ex partner: Not on file    Emotionally abused: Not on file    Physically abused: Not on file    Forced sexual activity: Not on file  Other Topics Concern   Not on file  Social History Narrative   Not on file    FAMILY HISTORY:  Family History  Problem Relation Age of Onset   Colon cancer Neg Hx     CURRENT MEDICATIONS:  Outpatient Encounter Medications as of 09/15/2018  Medication Sig Note   atorvastatin (LIPITOR) 10 MG tablet Take 10 mg by mouth at bedtime.     Cholecalciferol (VITAMIN D3) 5000 units CAPS Take 5,000 Units by mouth daily.     diphenoxylate-atropine (LOMOTIL) 2.5-0.025 MG tablet Take 1 tablet by mouth 4 (four) times daily as needed for diarrhea or loose stools.     gabapentin (NEURONTIN) 600 MG tablet Take 1 tablet (600 mg total) by mouth 2 (two) times daily.    lisinopril (PRINIVIL,ZESTRIL) 10 MG tablet Take 10 mg by mouth daily.    metoprolol (LOPRESSOR) 50 MG tablet Take 50 mg by mouth 2 (two) times daily.    Multiple Vitamins-Minerals (CENTRUM SILVER 50+MEN PO) Take 1 tablet by mouth daily.    ondansetron (ZOFRAN) 4 MG tablet Take 4 mg by mouth 2 (two) times daily. Take one tablet two times a day as needed for nausea.    RELION GLUCOSE TEST STRIPS test strip USE 1 STRIP TO CHECK GLUCOSE ONCE DAILY    vitamin B-12 (CYANOCOBALAMIN) 1000 MCG tablet Take 1,000 mcg by mouth daily.     [DISCONTINUED] prochlorperazine (COMPAZINE)  10 MG tablet Take 1 tablet (10 mg total) by mouth every 6 (six) hours as needed (Nausea or vomiting). 08/05/2016: New Med   Facility-Administered Encounter Medications as of 09/15/2018  Medication   sodium chloride flush (NS) 0.9 % injection 10 mL    ALLERGIES:  Allergies  Allergen Reactions   Amoxicillin Hives    Has patient had a PCN reaction causing immediate rash, facial/tongue/throat swelling, SOB or lightheadedness with hypotension: No Has patient had a PCN reaction causing severe rash involving mucus membranes or skin necrosis: Yes Has patient had a PCN reaction that required hospitalization No Has patient had a PCN reaction occurring within the last 10 years: No If all of the above answers are "NO", then may proceed with Cephalosporin use.    Tamsulosin Hcl     Cant sleep, confusion      PHYSICAL EXAM:  ECOG Performance status: 1  Vitals:   09/15/18 0812  BP: 126/66  Pulse: 79  Resp: 18  Temp: 97.9 F (36.6 C)  SpO2: 99%   Filed Weights   09/15/18 0812  Weight: 170 lb 3.2 oz (77.2 kg)    Physical Exam Vitals signs reviewed.  Constitutional:      Appearance: Normal appearance.  Cardiovascular:     Rate and Rhythm: Normal rate and regular rhythm.     Heart sounds: Normal heart sounds.    Pulmonary:     Effort: Pulmonary effort is normal.     Breath sounds: Normal breath sounds.  Abdominal:     General: There is no distension.     Palpations: Abdomen is soft. There is no mass.  Musculoskeletal:        General: No swelling.  Skin:    General: Skin is warm.  Neurological:     General: No focal deficit present.     Mental Status: He is alert and oriented to person, place, and time.  Psychiatric:        Mood and Affect: Mood normal.        Behavior: Behavior normal.      LABORATORY DATA:  I have reviewed the labs as listed.  CBC    Component Value Date/Time   WBC 7.9 09/15/2018 0801   RBC 3.10 (L) 09/15/2018 0801   HGB 10.5 (L) 09/15/2018 0801   HCT 33.4 (L) 09/15/2018 0801   PLT 180 09/15/2018 0801   MCV 107.7 (H) 09/15/2018 0801   MCH 33.9 09/15/2018 0801   MCHC 31.4 09/15/2018 0801   RDW 15.0 09/15/2018 0801   LYMPHSABS 2.8 09/15/2018 0801   MONOABS 1.3 (H) 09/15/2018 0801   EOSABS 0.2 09/15/2018 0801   BASOSABS 0.0 09/15/2018 0801   CMP Latest Ref Rng & Units 09/15/2018 09/01/2018 08/18/2018  Glucose 70 - 99 mg/dL 116(H) 128(H) 141(H)  BUN 8 - 23 mg/dL _0 Creatinine 0.61 - 1.24 mg/dL 0.91 0.88 0.90  Sodium 135 - 145 mmol/L 141 139 138  Potassium 3.5 - 5.1 mmol/L 4.0 4.3 4.2  Chloride 98 - 111 mmol/L 105 103 103  CO2 22 - 32 mmol/L _1 Calcium 8.9 - 10.3 mg/dL 9.0 9.3 8.9  Total Protein 6.5 - 8.1 g/dL 6.4(L) 6.6 6.4(L)  Total Bilirubin 0.3 - 1.2 mg/dL 1.1 0.8 0.9  Alkaline Phos 38 - 126 U/L 44 43 41  AST 15 - 41 U/L _2 ALT 0 - 44 U/L _3 DIAGNOSTIC IMAGING:  I have  independently reviewed the scans and discussed with the patient.    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  Stage IV colon cancer (pT3 pN1 M1) MSI -High: -K-ras mutation negative, BRAF V600E positive, PIK3CA, CDKN2A,TP53 - 12 cycles of FOLFOX from 08/26/2016 through 01/27/2017. -Opdivo from 02/23/2017 through  07/13/2017. -FOLFIRI and bevacizumab started on 07/27/2017 - Cycle 29 of FOLFIRI and bevacizumab on 09/01/2018. -CT CAP on 07/29/2018 suggest stable disease with minimal improvement.  Last CEA is 8.5. - He had diarrhea 3 days after his last chemotherapy, lasting about 2 days.  I have told him to increase Lomotil to 2 tablets 3 times a day when he has diarrhea. -He was using a tablespoon of flour to help with the diarrhea.  I will cut back on the dose of Irinotecan. -I will see him back in 2 weeks for follow-up.   2.  Peripheral neuropathy: -He has tingling and numbness in the feet which is constant.  We will continue gabapentin 600 mg twice daily.  3.  Diarrhea: -I have told him to increase Lomotil to 2 tablets 3 times a day on the days of diarrhea.  4.  Weight loss: -He gained 3 pounds since last visit. - His granddaughter reportedly will come in July to help with cooking and household activities.  His appetite has been good.       Total time spent is 25 minutes with more than 50% of the time spent face-to-face discussing treatment plan and coordination of care.    Orders placed this encounter:  Orders Placed This Encounter  Procedures   CBC with Differential   Comprehensive metabolic panel   Urinalysis, dipstick only      Derek Jack, MD [place] [phone]

## 2018-09-15 NOTE — Patient Instructions (Signed)
[facility] Discharge Instructions for Patients Receiving Chemotherapy  Today you received the following chemotherapy agents   To help prevent nausea and vomiting after your treatment, we encourage you to take your nausea medication   If you develop nausea and vomiting that is not controlled by your nausea medication, call the clinic.   BELOW ARE SYMPTOMS THAT SHOULD BE REPORTED IMMEDIATELY:  *FEVER GREATER THAN 100.5 F  *CHILLS WITH OR WITHOUT FEVER  NAUSEA AND VOMITING THAT IS NOT CONTROLLED WITH YOUR NAUSEA MEDICATION  *UNUSUAL SHORTNESS OF BREATH  *UNUSUAL BRUISING OR BLEEDING  TENDERNESS IN MOUTH AND THROAT WITH OR WITHOUT PRESENCE OF ULCERS  *URINARY PROBLEMS  *BOWEL PROBLEMS  UNUSUAL RASH Items with * indicate a potential emergency and should be followed up as soon as possible.  Feel free to call the clinic should you have any questions or concerns. The clinic phone number is [phone].  Please show the CHEMO ALERT CARD at check-in to the Emergency Department and triage nurse.   

## 2018-09-16 ENCOUNTER — Other Ambulatory Visit: Payer: Self-pay

## 2018-09-17 ENCOUNTER — Other Ambulatory Visit: Payer: Self-pay

## 2018-09-17 ENCOUNTER — Inpatient Hospital Stay (HOSPITAL_COMMUNITY): Payer: Medicare Other

## 2018-09-17 ENCOUNTER — Encounter (HOSPITAL_COMMUNITY): Payer: Self-pay

## 2018-09-17 VITALS — BP 141/73 | HR 72 | Temp 98.2°F | Resp 18

## 2018-09-17 DIAGNOSIS — Z95828 Presence of other vascular implants and grafts: Secondary | ICD-10-CM

## 2018-09-17 DIAGNOSIS — C184 Malignant neoplasm of transverse colon: Secondary | ICD-10-CM

## 2018-09-17 DIAGNOSIS — Z5111 Encounter for antineoplastic chemotherapy: Secondary | ICD-10-CM | POA: Diagnosis not present

## 2018-09-17 MED ORDER — SODIUM CHLORIDE 0.9% FLUSH
10.0000 mL | INTRAVENOUS | Status: DC | PRN
  Administered 2018-09-17: 10 mL
  Filled 2018-09-17: qty 10

## 2018-09-17 MED ORDER — HEPARIN SOD (PORK) LOCK FLUSH 100 UNIT/ML IV SOLN
500.0000 [IU] | Freq: Once | INTRAVENOUS | Status: AC | PRN
  Administered 2018-09-17: 500 [IU]

## 2018-09-17 NOTE — Progress Notes (Signed)
Kevin Kirk tolerated 5FU pump well without complaints or incident. 5FU discontinued with portacath flushed easily per protocol then de-accessed. VSS Pt discharged self ambulatory using his cane in satisfactory condition

## 2018-09-17 NOTE — Patient Instructions (Signed)
[place] at [facility] Discharge Instructions  5FU pump discontinued with portacath flushed per protocol. Follow-up as scheduled. Call clinic for any questions or concerns   Thank you for choosing [place] at [facility] to provide your oncology and hematology care.  To afford each patient quality time with our provider, please arrive at least 15 minutes before your scheduled appointment time.   If you have a lab appointment with the [facility] please come in thru the  Main Entrance and check in at the main information desk  You need to re-schedule your appointment should you arrive 10 or more minutes late.  We strive to give you quality time with our providers, and arriving late affects you and other patients whose appointments are after yours.  Also, if you no show three or more times for appointments you may be dismissed from the clinic at the providers discretion.     Again, thank you for choosing [facility].  Our hope is that these requests will decrease the amount of time that you wait before being seen by our physicians.       _____________________________________________________________  Should you have questions after your visit to [facility], please contact our office at [phone] between the hours of 8:00 a.m. and 4:30 p.m.  Voicemails left after 4:00 p.m. will not be returned until the following business day.  For prescription refill requests, have your pharmacy contact our office and allow 72 hours.    Cancer Center Support Programs:   > Cancer Support Group  2nd Tuesday of the month 1pm-2pm, Journey Room

## 2018-09-29 ENCOUNTER — Inpatient Hospital Stay (HOSPITAL_COMMUNITY): Payer: Medicare Other | Attending: Hematology

## 2018-09-29 ENCOUNTER — Encounter (HOSPITAL_COMMUNITY): Payer: Self-pay | Admitting: Hematology

## 2018-09-29 ENCOUNTER — Inpatient Hospital Stay (HOSPITAL_COMMUNITY): Payer: Medicare Other

## 2018-09-29 ENCOUNTER — Other Ambulatory Visit: Payer: Self-pay

## 2018-09-29 ENCOUNTER — Inpatient Hospital Stay (HOSPITAL_BASED_OUTPATIENT_CLINIC_OR_DEPARTMENT_OTHER): Payer: Medicare Other | Admitting: Hematology

## 2018-09-29 VITALS — BP 140/62 | HR 57 | Temp 96.8°F | Resp 18

## 2018-09-29 DIAGNOSIS — C184 Malignant neoplasm of transverse colon: Secondary | ICD-10-CM | POA: Insufficient documentation

## 2018-09-29 DIAGNOSIS — J449 Chronic obstructive pulmonary disease, unspecified: Secondary | ICD-10-CM | POA: Diagnosis not present

## 2018-09-29 DIAGNOSIS — Z86711 Personal history of pulmonary embolism: Secondary | ICD-10-CM | POA: Diagnosis not present

## 2018-09-29 DIAGNOSIS — Z79899 Other long term (current) drug therapy: Secondary | ICD-10-CM

## 2018-09-29 DIAGNOSIS — I10 Essential (primary) hypertension: Secondary | ICD-10-CM | POA: Diagnosis not present

## 2018-09-29 DIAGNOSIS — Z5111 Encounter for antineoplastic chemotherapy: Secondary | ICD-10-CM | POA: Diagnosis not present

## 2018-09-29 DIAGNOSIS — R634 Abnormal weight loss: Secondary | ICD-10-CM | POA: Insufficient documentation

## 2018-09-29 DIAGNOSIS — R197 Diarrhea, unspecified: Secondary | ICD-10-CM

## 2018-09-29 DIAGNOSIS — E7800 Pure hypercholesterolemia, unspecified: Secondary | ICD-10-CM | POA: Diagnosis not present

## 2018-09-29 DIAGNOSIS — G4730 Sleep apnea, unspecified: Secondary | ICD-10-CM | POA: Diagnosis not present

## 2018-09-29 DIAGNOSIS — Z87891 Personal history of nicotine dependence: Secondary | ICD-10-CM | POA: Insufficient documentation

## 2018-09-29 DIAGNOSIS — E1142 Type 2 diabetes mellitus with diabetic polyneuropathy: Secondary | ICD-10-CM | POA: Insufficient documentation

## 2018-09-29 DIAGNOSIS — Z95828 Presence of other vascular implants and grafts: Secondary | ICD-10-CM

## 2018-09-29 LAB — COMPREHENSIVE METABOLIC PANEL
ALT: 17 U/L (ref 0–44)
AST: 25 U/L (ref 15–41)
Albumin: 3.7 g/dL (ref 3.5–5.0)
Alkaline Phosphatase: 40 U/L (ref 38–126)
Anion gap: 8 (ref 5–15)
BUN: 20 mg/dL (ref 8–23)
CO2: 25 mmol/L (ref 22–32)
Calcium: 8.9 mg/dL (ref 8.9–10.3)
Chloride: 106 mmol/L (ref 98–111)
Creatinine, Ser: 0.97 mg/dL (ref 0.61–1.24)
GFR calc Af Amer: >60 mL/min (ref >60)
GFR calc non Af Amer: >60 mL/min (ref >60)
Glucose, Bld: 174 mg/dL — ABNORMAL HIGH (ref 70–99)
Potassium: 4 mmol/L (ref 3.5–5.1)
Sodium: 139 mmol/L (ref 135–145)
Total Bilirubin: 1 mg/dL (ref 0.3–1.2)
Total Protein: 6.8 g/dL (ref 6.5–8.1)

## 2018-09-29 LAB — URINALYSIS, DIPSTICK ONLY
Bilirubin Urine: NEGATIVE
Glucose, UA: NEGATIVE mg/dL
Hgb urine dipstick: NEGATIVE
Ketones, ur: NEGATIVE mg/dL
Leukocytes,Ua: NEGATIVE
Nitrite: NEGATIVE
Protein, ur: NEGATIVE mg/dL
Specific Gravity, Urine: 1.023 (ref 1.005–1.030)
pH: 5 (ref 5.0–8.0)

## 2018-09-29 LAB — CBC WITH DIFFERENTIAL/PLATELET
Abs Immature Granulocytes: 0.01 10*3/uL (ref 0.00–0.07)
Basophils Absolute: 0 10*3/uL (ref 0.0–0.1)
Basophils Relative: 1 %
Eosinophils Absolute: 0.1 10*3/uL (ref 0.0–0.5)
Eosinophils Relative: 2 %
HCT: 35 % — ABNORMAL LOW (ref 39.0–52.0)
Hemoglobin: 10.9 g/dL — ABNORMAL LOW (ref 13.0–17.0)
Immature Granulocytes: 0 %
Lymphocytes Relative: 36 %
Lymphs Abs: 2.2 10*3/uL (ref 0.7–4.0)
MCH: 34.1 pg — ABNORMAL HIGH (ref 26.0–34.0)
MCHC: 31.1 g/dL (ref 30.0–36.0)
MCV: 109.4 fL — ABNORMAL HIGH (ref 80.0–100.0)
Monocytes Absolute: 1.1 10*3/uL — ABNORMAL HIGH (ref 0.1–1.0)
Monocytes Relative: 19 %
Neutro Abs: 2.6 10*3/uL (ref 1.7–7.7)
Neutrophils Relative %: 42 %
Platelets: 203 10*3/uL (ref 150–400)
RBC: 3.2 MIL/uL — ABNORMAL LOW (ref 4.22–5.81)
RDW: 15.1 % (ref 11.5–15.5)
WBC: 6.1 10*3/uL (ref 4.0–10.5)
nRBC: 0 % (ref 0.0–0.2)

## 2018-09-29 MED ORDER — IRINOTECAN HCL CHEMO INJECTION 100 MG/5ML
135.0000 mg/m2 | Freq: Once | INTRAVENOUS | Status: AC
  Administered 2018-09-29: 280 mg via INTRAVENOUS
  Filled 2018-09-29: qty 10

## 2018-09-29 MED ORDER — FLUOROURACIL CHEMO INJECTION 2.5 GM/50ML
320.0000 mg/m2 | Freq: Once | INTRAVENOUS | Status: AC
  Administered 2018-09-29: 650 mg via INTRAVENOUS
  Filled 2018-09-29: qty 13

## 2018-09-29 MED ORDER — ATROPINE SULFATE 1 MG/ML IJ SOLN
0.5000 mg | Freq: Once | INTRAMUSCULAR | Status: AC
  Administered 2018-09-29: 0.5 mg via INTRAVENOUS
  Filled 2018-09-29: qty 1

## 2018-09-29 MED ORDER — SODIUM CHLORIDE 0.9 % IV SOLN
INTRAVENOUS | Status: DC
  Administered 2018-09-29: 10:00:00 via INTRAVENOUS

## 2018-09-29 MED ORDER — LEUCOVORIN CALCIUM INJECTION 350 MG
700.0000 mg | Freq: Once | INTRAVENOUS | Status: AC
  Administered 2018-09-29: 700 mg via INTRAVENOUS
  Filled 2018-09-29: qty 35

## 2018-09-29 MED ORDER — SODIUM CHLORIDE 0.9 % IV SOLN
400.0000 mg | Freq: Once | INTRAVENOUS | Status: AC
  Administered 2018-09-29: 400 mg via INTRAVENOUS
  Filled 2018-09-29: qty 16

## 2018-09-29 MED ORDER — PALONOSETRON HCL INJECTION 0.25 MG/5ML
INTRAVENOUS | Status: AC
  Filled 2018-09-29: qty 5

## 2018-09-29 MED ORDER — SODIUM CHLORIDE 0.9 % IV SOLN
Freq: Once | INTRAVENOUS | Status: AC
  Administered 2018-09-29: 09:00:00 via INTRAVENOUS

## 2018-09-29 MED ORDER — SODIUM CHLORIDE 0.9 % IV SOLN
1920.0000 mg/m2 | INTRAVENOUS | Status: DC
  Administered 2018-09-29: 3950 mg via INTRAVENOUS
  Filled 2018-09-29: qty 79

## 2018-09-29 MED ORDER — SODIUM CHLORIDE 0.9% FLUSH
10.0000 mL | INTRAVENOUS | Status: DC | PRN
  Administered 2018-09-29: 10 mL
  Filled 2018-09-29: qty 10

## 2018-09-29 MED ORDER — PALONOSETRON HCL INJECTION 0.25 MG/5ML
0.2500 mg | Freq: Once | INTRAVENOUS | Status: AC
  Administered 2018-09-29: 0.25 mg via INTRAVENOUS

## 2018-09-29 MED ORDER — SODIUM CHLORIDE 0.9 % IV SOLN
10.0000 mg | Freq: Once | INTRAVENOUS | Status: AC
  Administered 2018-09-29: 10 mg via INTRAVENOUS
  Filled 2018-09-29: qty 10

## 2018-09-29 NOTE — Assessment & Plan Note (Addendum)
1.  Stage IV colon cancer (pT3 pN1 M1) MSI-high: - K-ras mutation negative, BRAF V600 E+, PIK3CA,CDKN2A,TP53 - 12 cycles of FOLFOX from 08/26/2016 through 01/27/2017. -Opdivo from 02/23/2017 through 07/13/2017. -FOLFIRI and bevacizumab started on 07/27/2017. -Cycle 30 of FOLFIRI and bevacizumab on 09/15/2018, Irinotecan dose reduced at 135 mg per metered square and 20% reduction of 5-FU - CT CAP on 07/29/2018 suggest stable disease with minimal improvement. -Last CEA was 8.5 on 08/03/2018. - His blood work was reviewed.  He may proceed with his next cycle today. - We will see him back in 2 weeks for follow-up and next treatment.  2.  Peripheral neuropathy: - He has tingling and numbness in the feet which is constant.  He will continue gabapentin 600 mg twice daily.  3.  Diarrhea: - His diarrhea has improved after we cut back Irinotecan to 135 mg per metered square. -He is taking 1 tablet of Imodium and 1 tablet of Lomotil twice daily.  If he takes more than that he get constipated.  4.  Weight loss: -He lost about 5 pounds since last visit 2 weeks ago. -She reports that he has been eating well but has been working outside the house in the last 2 weeks and hence failed to gain any weight.

## 2018-09-29 NOTE — Progress Notes (Signed)
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[place])   07/27/2017 -  Chemotherapy   The patient had palonosetron (ALOXI) injection 0.25 mg, 0.25 mg, Intravenous,  Once, 30 of 33 cycles Administration: 0.25 mg (07/27/2017), 0.25 mg (08/10/2017), 0.25 mg (08/24/2017), 0.25 mg (09/07/2017), 0.25 mg (09/21/2017), 0.25 mg (10/05/2017), 0.25 mg (10/19/2017), 0.25 mg (11/02/2017), 0.25 mg (11/16/2017), 0.25 mg (11/30/2017), 0.25 mg (12/14/2017), 0.25 mg (12/28/2017), 0.25 mg (01/12/2018), 0.25 mg (01/26/2018), 0.25 mg (02/09/2018), 0.25 mg (02/24/2018), 0.25 mg (03/10/2018), 0.25 mg (03/29/2018), 0.25 mg (04/12/2018), 0.25 mg (04/26/2018), 0.25 mg (05/10/2018), 0.25 mg (05/24/2018), 0.25 mg (06/07/2018), 0.25 mg (06/21/2018), 0.25 mg (07/05/2018), 0.25 mg (07/19/2018), 0.25 mg (08/03/2018), 0.25 mg (08/18/2018), 0.25 mg (09/01/2018), 0.25 mg (09/15/2018) pegfilgrastim-cbqv (UDENYCA) injection 6 mg, 6 mg, Subcutaneous, Once, 7 of 7 cycles Administration: 6 mg (10/07/2017), 6 mg (10/21/2017), 6 mg (11/04/2017), 6 mg (11/18/2017), 6 mg (12/02/2017), 6 mg (12/16/2017), 6 mg (12/30/2017) bevacizumab (AVASTIN) 450 mg in sodium chloride 0.9 % 100 mL chemo infusion, 5 mg/kg = 450 mg, Intravenous,  Once, 30 of 33 cycles Dose modification: 4 mg/kg (80 % of original dose 5 mg/kg, Cycle 5, Reason: Provider Judgment), 5 mg/kg (original dose 5 mg/kg, Cycle 16, Reason: Other (see comments)) Administration: 450 mg (07/27/2017), 450 mg (08/10/2017), 450 mg (08/24/2017), 450 mg (09/07/2017), 450 mg (09/21/2017), 450 mg (10/05/2017), 450 mg (10/19/2017), 450 mg (11/02/2017), 450 mg (11/16/2017), 450 mg (11/30/2017), 450  mg (12/14/2017), 450 mg (12/28/2017), 400 mg (01/12/2018), 400 mg (01/26/2018), 400 mg (02/09/2018), 400 mg (02/24/2018), 400 mg (03/10/2018), 400 mg (03/29/2018), 400 mg (04/12/2018), 400 mg (04/26/2018), 400 mg (05/10/2018), 400 mg (05/24/2018), 400 mg (06/07/2018), 400 mg (06/21/2018), 400 mg (07/05/2018), 400 mg (07/19/2018), 400 mg (08/03/2018), 400 mg (08/18/2018), 400 mg (09/01/2018), 400 mg (09/15/2018) irinotecan (CAMPTOSAR) 400 mg in dextrose 5 % 500 mL chemo infusion, 180 mg/m2 = 400 mg, Intravenous,  Once, 30 of 33 cycles Dose modification: 144 mg/m2 (80 % of original dose 180 mg/m2, Cycle 5, Reason: Provider Judgment), 135 mg/m2 (75 % of original dose 180 mg/m2, Cycle 30, Reason: Other (see comments), Comment: diarrhea) Administration: 400 mg (07/27/2017), 400 mg (08/10/2017), 400 mg (08/24/2017), 400 mg (09/07/2017), 320 mg (09/21/2017), 320 mg (10/05/2017), 320 mg (10/19/2017), 320 mg (11/02/2017), 320 mg (11/16/2017), 320 mg (11/30/2017), 320 mg (12/14/2017), 320 mg (12/28/2017), 320 mg (01/12/2018), 320 mg (01/26/2018), 320 mg (02/09/2018), 300 mg (02/24/2018), 300 mg (03/10/2018), 300 mg (03/29/2018), 300 mg (04/12/2018), 300 mg (04/26/2018), 300 mg (05/10/2018), 300 mg (05/24/2018), 300 mg (06/07/2018), 300 mg (06/21/2018), 300 mg (07/05/2018), 300 mg (07/19/2018), 300 mg (08/03/2018), 300 mg (08/18/2018), 300 mg (09/01/2018), 280 mg (09/15/2018) leucovorin 800 mg in dextrose 5 % 250 mL infusion, 872 mg, Intravenous,  Once, 30 of 33 cycles Dose modification: 320 mg/m2 (80 % of original dose 400 mg/m2, Cycle 5, Reason: Provider Judgment) Administration: 800 mg (07/27/2017), 800 mg (08/10/2017), 800 mg (08/24/2017), 800 mg (09/07/2017), 700 mg (09/21/2017), 700 mg (10/05/2017), 700 mg (10/19/2017), 700 mg (11/02/2017), 700 mg (11/16/2017), 700 mg (11/30/2017), 700 mg (12/14/2017), 700 mg (12/28/2017), 700 mg (01/12/2018), 700 mg (01/26/2018), 700 mg (02/09/2018), 700 mg (02/24/2018), 700 mg (  03/10/2018), 700 mg (03/29/2018), 700 mg (04/12/2018), 700 mg (04/26/2018),  700 mg (05/10/2018), 700 mg (05/24/2018), 700 mg (06/07/2018), 700 mg (06/21/2018), 700 mg (07/05/2018), 700 mg (07/19/2018), 700 mg (08/03/2018), 700 mg (08/18/2018), 700 mg (09/01/2018), 700 mg (09/15/2018) fluorouracil (ADRUCIL) chemo injection 850 mg, 400 mg/m2 = 850 mg, Intravenous,  Once, 30 of 33 cycles Dose modification: 320 mg/m2 (80 % of original dose 400 mg/m2, Cycle 5, Reason: Provider Judgment) Administration: 850 mg (07/27/2017), 850 mg (08/10/2017), 850 mg (08/24/2017), 850 mg (09/07/2017), 700 mg (09/21/2017), 700 mg (10/05/2017), 700 mg (10/19/2017), 700 mg (11/02/2017), 700 mg (11/16/2017), 700 mg (11/30/2017), 700 mg (12/14/2017), 700 mg (12/28/2017), 700 mg (01/12/2018), 700 mg (01/26/2018), 700 mg (02/09/2018), 650 mg (02/24/2018), 650 mg (03/10/2018), 650 mg (03/29/2018), 650 mg (04/12/2018), 650 mg (04/26/2018), 650 mg (05/10/2018), 650 mg (05/24/2018), 650 mg (06/07/2018), 650 mg (06/21/2018), 650 mg (07/05/2018), 650 mg (07/19/2018), 650 mg (08/03/2018), 650 mg (08/18/2018), 650 mg (09/01/2018), 650 mg (09/15/2018) fluorouracil (ADRUCIL) 5,250 mg in sodium chloride 0.9 % 145 mL chemo infusion, 2,400 mg/m2 = 5,250 mg, Intravenous, 1 Day/Dose, 30 of 33 cycles Dose modification: 1,920 mg/m2 (80 % of original dose 2,400 mg/m2, Cycle 5, Reason: Provider Judgment) Administration: 5,250 mg (07/27/2017), 5,250 mg (08/10/2017), 5,250 mg (08/24/2017), 5,250 mg (09/07/2017), 4,200 mg (09/21/2017), 4,200 mg (10/05/2017), 4,200 mg (10/19/2017), 4,200 mg (11/02/2017), 4,200 mg (11/16/2017), 4,200 mg (11/30/2017), 4,200 mg (12/14/2017), 4,200 mg (12/28/2017), 4,200 mg (01/12/2018), 4,200 mg (01/26/2018), 4,200 mg (02/09/2018), 3,950 mg (02/24/2018), 3,950 mg (03/10/2018), 3,950 mg (03/29/2018), 3,950 mg (04/12/2018), 3,950 mg (04/26/2018), 3,950 mg (05/10/2018), 3,950 mg (05/24/2018), 3,950 mg (06/07/2018), 3,950 mg (06/21/2018), 3,950 mg (07/05/2018), 3,950 mg (07/19/2018), 3,950 mg (08/03/2018), 3,950 mg (08/18/2018), 3,950 mg (09/01/2018), 3,950 mg (09/15/2018)  for  chemotherapy treatment.       CANCER STAGING: Cancer Staging Malignant neoplasm of transverse colon [facility]) Staging form: Colon and Rectum, AJCC 8th Edition - Clinical: cT3, cN1a, cM1 - Signed by Twana First, MD on 03/09/2017 - Pathologic: No stage assigned - Unsigned    INTERVAL HISTORY:  Kevin Kirk 85 y.o. male seen for follow-up of metastatic colon cancer and chemotherapy.  Appetite is 100%.  Energy levels are 75%.  Pain is reported as 0.  Lost 5 pounds from last visit.  He reportedly had some soreness in the muscles of his jaw for the first week after last chemotherapy.  It was mildly hurting while he was eating.  However he reports that he has eaten well.  However he was working outside to build a chicken coop at his house.  Diarrhea was much better with last treatment.    REVIEW OF SYSTEMS:  Review of Systems  HENT:   Positive for mouth sores.   Neurological: Positive for dizziness and numbness.  All other systems reviewed and are negative.    PAST MEDICAL/SURGICAL HISTORY:  Past Medical History:  Diagnosis Date   Chronic pulmonary embolism ([place]) 44/0102   compliction of the valve replacement surgery   COPD (chronic obstructive pulmonary disease) ([place])    Diabetes ([place]) 04/22/2016   H/O aortic valve replacement 10/2008   Hypercholesteremia    Hypertension    Pulmonary emboli ([place]) 10/2008   Sleep apnea    Past Surgical History:  Procedure Laterality Date   AORTIC VALVE REPLACEMENT     2010   CARDIAC CATHETERIZATION  2010   COLONOSCOPY N/A 06/05/2016   Procedure: COLONOSCOPY;  Surgeon: Rogene Houston, MD;  Location: [facility] ENDO SU[facility];  Service: Endoscopy;  Laterality:  N/A;   PARTIAL COLECTOMY N/A 07/11/2016   Procedure: PARTIAL COLECTOMY;  Surgeon: Aviva Signs, MD;  Location: [facility] ORS;  Service: General;  Laterality: N/A;   PORTACATH PLACEMENT N/A 08/13/2016   Procedure: INSERTION PORT-A-CATH LEFT SUBCLAVIAN;  Surgeon: Aviva Signs, MD;  Location: [facility] ORS;   Service: General;  Laterality: N/A;   REPLACEMENT TOTAL KNEE     1996   spenectomy     from trauma     SOCIAL HISTORY:  Social History   Socioeconomic History   Marital status: Widowed    Spouse name: Not on file   Number of children: Not on file   Years of education: Not on file   Highest education level: Not on file  Occupational History   Not on file  Social Needs   Financial resource strain: Not on file   Food insecurity    Worry: Not on file    Inability: Not on file   Transportation needs    Medical: Not on file    Non-medical: Not on file  Tobacco Use   Smoking status: Former Smoker    Years: 15.00    Types: Cigarettes    Quit date: 1964    Years since quitting: 56.5   Smokeless tobacco: Never Used  Substance and Sexual Activity   Alcohol use: No   Drug use: No   Sexual activity: Yes  Lifestyle   Physical activity    Days per week: Not on file    Minutes per session: Not on file   Stress: Not on file  Relationships   Social connections    Talks on phone: Not on file    Gets together: Not on file    Attends religious service: Not on file    Active member of club or organization: Not on file    Attends meetings of clubs or organizations: Not on file    Relationship status: Not on file   Intimate partner violence    Fear of current or ex partner: Not on file    Emotionally abused: Not on file    Physically abused: Not on file    Forced sexual activity: Not on file  Other Topics Concern   Not on file  Social History Narrative   Not on file    FAMILY HISTORY:  Family History  Problem Relation Age of Onset   Colon cancer Neg Hx     CURRENT MEDICATIONS:  Outpatient Encounter Medications as of 09/29/2018  Medication Sig Note   atorvastatin (LIPITOR) 10 MG tablet Take 10 mg by mouth at bedtime.     Cholecalciferol (VITAMIN D3) 5000 units CAPS Take 5,000 Units by mouth daily.     diphenoxylate-atropine (LOMOTIL) 2.5-0.025 MG  tablet Take 1 tablet by mouth 4 (four) times daily as needed for diarrhea or loose stools.    gabapentin (NEURONTIN) 600 MG tablet Take 1 tablet (600 mg total) by mouth 2 (two) times daily.    lisinopril (PRINIVIL,ZESTRIL) 10 MG tablet Take 10 mg by mouth daily.    metoprolol (LOPRESSOR) 50 MG tablet Take 50 mg by mouth 2 (two) times daily.    Multiple Vitamins-Minerals (CENTRUM SILVER 50+MEN PO) Take 1 tablet by mouth daily.    ondansetron (ZOFRAN) 4 MG tablet Take 4 mg by mouth 2 (two) times daily. Take one tablet two times a day as needed for nausea.    RELION GLUCOSE TEST STRIPS test strip USE 1 STRIP TO CHECK GLUCOSE ONCE DAILY    vitamin  B-12 (CYANOCOBALAMIN) 1000 MCG tablet Take 1,000 mcg by mouth daily.     [DISCONTINUED] prochlorperazine (COMPAZINE) 10 MG tablet Take 1 tablet (10 mg total) by mouth every 6 (six) hours as needed (Nausea or vomiting). 08/05/2016: New Med   Facility-Administered Encounter Medications as of 09/29/2018  Medication   sodium chloride flush (NS) 0.9 % injection 10 mL    ALLERGIES:  Allergies  Allergen Reactions   Amoxicillin Hives    Has patient had a PCN reaction causing immediate rash, facial/tongue/throat swelling, SOB or lightheadedness with hypotension: No Has patient had a PCN reaction causing severe rash involving mucus membranes or skin necrosis: Yes Has patient had a PCN reaction that required hospitalization No Has patient had a PCN reaction occurring within the last 10 years: No If all of the above answers are "NO", then may proceed with Cephalosporin use.    Tamsulosin Hcl     Cant sleep, confusion      PHYSICAL EXAM:  ECOG Performance status: 1  Vitals:   09/29/18 0750  BP: 123/63  Pulse: 84  Resp: 18  Temp: 97.9 F (36.6 C)  SpO2: 99%   Filed Weights   09/29/18 0750  Weight: 165 lb 9.6 oz (75.1 kg)    Physical Exam Vitals signs reviewed.  Constitutional:      Appearance: Normal appearance.  Cardiovascular:      Rate and Rhythm: Normal rate and regular rhythm.     Heart sounds: Normal heart sounds.  Pulmonary:     Effort: Pulmonary effort is normal.     Breath sounds: Normal breath sounds.  Abdominal:     General: There is no distension.     Palpations: Abdomen is soft. There is no mass.  Musculoskeletal:        General: No swelling.  Skin:    General: Skin is warm.  Neurological:     General: No focal deficit present.     Mental Status: He is alert and oriented to person, place, and time.  Psychiatric:        Mood and Affect: Mood normal.        Behavior: Behavior normal.      LABORATORY DATA:  I have reviewed the labs as listed.  CBC    Component Value Date/Time   WBC 6.1 09/29/2018 0747   RBC 3.20 (L) 09/29/2018 0747   HGB 10.9 (L) 09/29/2018 0747   HCT 35.0 (L) 09/29/2018 0747   PLT 203 09/29/2018 0747   MCV 109.4 (H) 09/29/2018 0747   MCH 34.1 (H) 09/29/2018 0747   MCHC 31.1 09/29/2018 0747   RDW 15.1 09/29/2018 0747   LYMPHSABS 2.2 09/29/2018 0747   MONOABS 1.1 (H) 09/29/2018 0747   EOSABS 0.1 09/29/2018 0747   BASOSABS 0.0 09/29/2018 0747   CMP Latest Ref Rng & Units 09/29/2018 09/15/2018 09/01/2018  Glucose 70 - 99 mg/dL 174(H) 116(H) 128(H)  BUN 8 - 23 mg/dL '20 15 20  ' Creatinine 0.61 - 1.24 mg/dL 0.97 0.91 0.88  Sodium 135 - 145 mmol/L 139 141 139  Potassium 3.5 - 5.1 mmol/L 4.0 4.0 4.3  Chloride 98 - 111 mmol/L 106 105 103  CO2 22 - 32 mmol/L '25 27 25  ' Calcium 8.9 - 10.3 mg/dL 8.9 9.0 9.3  Total Protein 6.5 - 8.1 g/dL 6.8 6.4(L) 6.6  Total Bilirubin 0.3 - 1.2 mg/dL 1.0 1.1 0.8  Alkaline Phos 38 - 126 U/L 40 44 43  AST 15 - 41 U/L '25 20 23  ' ALT  0 - 44 U/L '17 14 21       ' DIAGNOSTIC IMAGING:  I have independently reviewed the scans and discussed with the patient.    ASSESSMENT & PLAN:   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[place]) 1.  Stage IV colon cancer (pT3 pN1 M1) MSI-high: - K-ras mutation negative, BRAF V600 E+, PIK3CA,CDKN2A,TP53 - 12 cycles  of FOLFOX from 08/26/2016 through 01/27/2017. -Opdivo from 02/23/2017 through 07/13/2017. -FOLFIRI and bevacizumab started on 07/27/2017. -Cycle 30 of FOLFIRI and bevacizumab on 09/15/2018, Irinotecan dose reduced at 135 mg per metered square and 20% reduction of 5-FU - CT CAP on 07/29/2018 suggest stable disease with minimal improvement. -Last CEA was 8.5 on 08/03/2018. - His blood work was reviewed.  He may proceed with his next cycle today. - We will see him back in 2 weeks for follow-up and next treatment.  2.  Peripheral neuropathy: - He has tingling and numbness in the feet which is constant.  He will continue gabapentin 600 mg twice daily.  3.  Diarrhea: - His diarrhea has improved after we cut back Irinotecan to 135 mg per metered square. -He is taking 1 tablet of Imodium and 1 tablet of Lomotil twice daily.  If he takes more than that he get constipated.  4.  Weight loss: -He lost about 5 pounds since last visit 2 weeks ago. -She reports that he has been eating well but has been working outside the house in the last 2 weeks and hence failed to gain any weight.  Total time spent is 25 minutes with more than 50% of the time spent face-to-face discussing treatment plan and coordination of care.    Orders placed this encounter:  No orders of the defined types were placed in this encounter.     Kevin Jack, MD [place] [phone]

## 2018-09-29 NOTE — Progress Notes (Signed)
0824 Lab and urine results reviewed with and pt seen by Dr. Delton Coombes and pt approved for chemo tx today per MD  [place] tolerated chemo tx well without complaints or incident. Pt discharged with 5FU pump infusing without issues. VSS upon discharge. Pt discharged self ambulatory using his cane in satisfactory condition

## 2018-09-29 NOTE — Patient Instructions (Signed)
[facility] at [facility] Discharge Instructions  You were seen today by Dr. Katragadda. He went over your recent results. He will see you back in for labs and follow up.   Thank you for choosing [facility] at [facility] to provide your oncology and hematology care.  To afford each patient quality time with our provider, please arrive at least 15 minutes before your scheduled appointment time.   If you have a lab appointment with the Cancer Center please come in thru the  Main Entrance and check in at the main information desk  You need to re-schedule your appointment should you arrive 10 or more minutes late.  We strive to give you quality time with our providers, and arriving late affects you and other patients whose appointments are after yours.  Also, if you no show three or more times for appointments you may be dismissed from the clinic at the providers discretion.     Again, thank you for choosing [facility].  Our hope is that these requests will decrease the amount of time that you wait before being seen by our physicians.       _____________________________________________________________  Should you have questions after your visit to [facility], please contact our office at [phone] between the hours of 8:00 a.m. and 4:30 p.m.  Voicemails left after 4:00 p.m. will not be returned until the following business day.  For prescription refill requests, have your pharmacy contact our office and allow 72 hours.    Cancer Center Support Programs:   > Cancer Support Group  2nd Tuesday of the month 1pm-2pm, Journey Room    

## 2018-09-29 NOTE — Patient Instructions (Signed)
[facility] Discharge Instructions for Patients Receiving Chemotherapy   Beginning January 23rd 2017 lab work for the [facility] will be done in the  Main lab at [facility] on 1st floor. If you have a lab appointment with the [place] please come in thru the  Main Entrance and check in at the main information desk   Today you received the following chemotherapy agents Avastin,Irinotecan,Leucovorin and 5FU No follow-ups on file.-up as scheduled. Call clinic for any questions or concerns  To help prevent nausea and vomiting after your treatment, we encourage you to take your nausea medication   If you develop nausea and vomiting, or diarrhea that is not controlled by your medication, call the clinic.  The clinic phone number is [phone]. Office hours are Monday-Friday 8:30am-5:00pm.  BELOW ARE SYMPTOMS THAT SHOULD BE REPORTED IMMEDIATELY:  *FEVER GREATER THAN 101.0 F  *CHILLS WITH OR WITHOUT FEVER  NAUSEA AND VOMITING THAT IS NOT CONTROLLED WITH YOUR NAUSEA MEDICATION  *UNUSUAL SHORTNESS OF BREATH  *UNUSUAL BRUISING OR BLEEDING  TENDERNESS IN MOUTH AND THROAT WITH OR WITHOUT PRESENCE OF ULCERS  *URINARY PROBLEMS  *BOWEL PROBLEMS  UNUSUAL RASH Items with * indicate a potential emergency and should be followed up as soon as possible. If you have an emergency after office hours please contact your primary care physician or go to the nearest emergency department.  Please call the clinic during office hours if you have any questions or concerns.   You may also contact the Patient Navigator at [phone] should you have any questions or need assistance in obtaining follow up care.      Resources For Cancer Patients and their Caregivers ? American Cancer Society: Can assist with transportation, wigs, general needs, runs Look Good Feel Better.        727 464 7739 ? Cancer Care: Provides financial assistance, online support groups,  medication/co-pay assistance.  1-800-813-HOPE 8656739134) ? [place] Assists [place] cancer patients and their families through emotional , educational and financial support.  604-609-3678 ? [place] DSS Where to apply for food stamps, Medicaid and utility assistance. 775-159-8304 ? [facility]: Transportation to medical appointments. 765-318-8307 ? Social Security Administration: May apply for disability if have a Stage IV cancer. 8731200150 [phone] ? [employer], Disability and Transit Services: Assists with nutrition, care and transit needs. [phone]

## 2018-10-01 ENCOUNTER — Inpatient Hospital Stay (HOSPITAL_COMMUNITY): Payer: Medicare Other

## 2018-10-01 ENCOUNTER — Other Ambulatory Visit: Payer: Self-pay

## 2018-10-01 VITALS — BP 113/63 | HR 63 | Temp 97.8°F | Resp 18

## 2018-10-01 DIAGNOSIS — Z5111 Encounter for antineoplastic chemotherapy: Secondary | ICD-10-CM | POA: Diagnosis not present

## 2018-10-01 DIAGNOSIS — Z95828 Presence of other vascular implants and grafts: Secondary | ICD-10-CM

## 2018-10-01 DIAGNOSIS — C184 Malignant neoplasm of transverse colon: Secondary | ICD-10-CM

## 2018-10-01 MED ORDER — SODIUM CHLORIDE 0.9% FLUSH
10.0000 mL | INTRAVENOUS | Status: DC | PRN
  Administered 2018-10-01: 10 mL
  Filled 2018-10-01: qty 10

## 2018-10-01 MED ORDER — HEPARIN SOD (PORK) LOCK FLUSH 100 UNIT/ML IV SOLN
500.0000 [IU] | Freq: Once | INTRAVENOUS | Status: AC | PRN
  Administered 2018-10-01: 500 [IU]

## 2018-10-13 ENCOUNTER — Encounter (HOSPITAL_COMMUNITY): Payer: Self-pay

## 2018-10-13 ENCOUNTER — Other Ambulatory Visit: Payer: Self-pay

## 2018-10-13 ENCOUNTER — Inpatient Hospital Stay (HOSPITAL_COMMUNITY): Payer: Medicare Other

## 2018-10-13 ENCOUNTER — Inpatient Hospital Stay (HOSPITAL_BASED_OUTPATIENT_CLINIC_OR_DEPARTMENT_OTHER): Payer: Medicare Other | Admitting: Hematology

## 2018-10-13 VITALS — BP 138/75 | HR 53 | Temp 97.5°F | Resp 18

## 2018-10-13 DIAGNOSIS — E1142 Type 2 diabetes mellitus with diabetic polyneuropathy: Secondary | ICD-10-CM

## 2018-10-13 DIAGNOSIS — R197 Diarrhea, unspecified: Secondary | ICD-10-CM | POA: Diagnosis not present

## 2018-10-13 DIAGNOSIS — Z5111 Encounter for antineoplastic chemotherapy: Secondary | ICD-10-CM | POA: Diagnosis not present

## 2018-10-13 DIAGNOSIS — Z79899 Other long term (current) drug therapy: Secondary | ICD-10-CM

## 2018-10-13 DIAGNOSIS — Z95828 Presence of other vascular implants and grafts: Secondary | ICD-10-CM

## 2018-10-13 DIAGNOSIS — R634 Abnormal weight loss: Secondary | ICD-10-CM

## 2018-10-13 DIAGNOSIS — C184 Malignant neoplasm of transverse colon: Secondary | ICD-10-CM | POA: Diagnosis not present

## 2018-10-13 DIAGNOSIS — Z87891 Personal history of nicotine dependence: Secondary | ICD-10-CM

## 2018-10-13 LAB — COMPREHENSIVE METABOLIC PANEL
ALT: 26 U/L (ref 0–44)
AST: 31 U/L (ref 15–41)
Albumin: 3.4 g/dL — ABNORMAL LOW (ref 3.5–5.0)
Alkaline Phosphatase: 43 U/L (ref 38–126)
Anion gap: 8 (ref 5–15)
BUN: 15 mg/dL (ref 8–23)
CO2: 27 mmol/L (ref 22–32)
Calcium: 9 mg/dL (ref 8.9–10.3)
Chloride: 104 mmol/L (ref 98–111)
Creatinine, Ser: 0.97 mg/dL (ref 0.61–1.24)
GFR calc Af Amer: >60 mL/min (ref >60)
GFR calc non Af Amer: >60 mL/min (ref >60)
Glucose, Bld: 134 mg/dL — ABNORMAL HIGH (ref 70–99)
Potassium: 4.2 mmol/L (ref 3.5–5.1)
Sodium: 139 mmol/L (ref 135–145)
Total Bilirubin: 1 mg/dL (ref 0.3–1.2)
Total Protein: 6.2 g/dL — ABNORMAL LOW (ref 6.5–8.1)

## 2018-10-13 LAB — CBC WITH DIFFERENTIAL/PLATELET
Abs Immature Granulocytes: 0.01 10*3/uL (ref 0.00–0.07)
Basophils Absolute: 0 10*3/uL (ref 0.0–0.1)
Basophils Relative: 1 %
Eosinophils Absolute: 0.2 10*3/uL (ref 0.0–0.5)
Eosinophils Relative: 2 %
HCT: 34.9 % — ABNORMAL LOW (ref 39.0–52.0)
Hemoglobin: 10.9 g/dL — ABNORMAL LOW (ref 13.0–17.0)
Immature Granulocytes: 0 %
Lymphocytes Relative: 28 %
Lymphs Abs: 2.3 10*3/uL (ref 0.7–4.0)
MCH: 33.9 pg (ref 26.0–34.0)
MCHC: 31.2 g/dL (ref 30.0–36.0)
MCV: 108.4 fL — ABNORMAL HIGH (ref 80.0–100.0)
Monocytes Absolute: 1 10*3/uL (ref 0.1–1.0)
Monocytes Relative: 12 %
Neutro Abs: 4.8 10*3/uL (ref 1.7–7.7)
Neutrophils Relative %: 57 %
Platelets: 163 10*3/uL (ref 150–400)
RBC: 3.22 MIL/uL — ABNORMAL LOW (ref 4.22–5.81)
RDW: 15.2 % (ref 11.5–15.5)
WBC: 8.4 10*3/uL (ref 4.0–10.5)
nRBC: 0 % (ref 0.0–0.2)

## 2018-10-13 MED ORDER — SODIUM CHLORIDE 0.9 % IV SOLN
10.0000 mg | Freq: Once | INTRAVENOUS | Status: AC
  Administered 2018-10-13: 10 mg via INTRAVENOUS
  Filled 2018-10-13: qty 10

## 2018-10-13 MED ORDER — PALONOSETRON HCL INJECTION 0.25 MG/5ML
INTRAVENOUS | Status: AC
  Filled 2018-10-13: qty 5

## 2018-10-13 MED ORDER — ATROPINE SULFATE 1 MG/ML IJ SOLN
0.5000 mg | Freq: Once | INTRAMUSCULAR | Status: AC
  Administered 2018-10-13: 0.5 mg via INTRAVENOUS

## 2018-10-13 MED ORDER — SODIUM CHLORIDE 0.9 % IV SOLN
700.0000 mg | Freq: Once | INTRAVENOUS | Status: AC
  Administered 2018-10-13: 700 mg via INTRAVENOUS
  Filled 2018-10-13: qty 35

## 2018-10-13 MED ORDER — FLUOROURACIL CHEMO INJECTION 2.5 GM/50ML
320.0000 mg/m2 | Freq: Once | INTRAVENOUS | Status: AC
  Administered 2018-10-13: 650 mg via INTRAVENOUS
  Filled 2018-10-13: qty 13

## 2018-10-13 MED ORDER — PALONOSETRON HCL INJECTION 0.25 MG/5ML
0.2500 mg | Freq: Once | INTRAVENOUS | Status: AC
  Administered 2018-10-13: 0.25 mg via INTRAVENOUS

## 2018-10-13 MED ORDER — SODIUM CHLORIDE 0.9 % IV SOLN
Freq: Once | INTRAVENOUS | Status: AC
  Administered 2018-10-13: 10:00:00 via INTRAVENOUS

## 2018-10-13 MED ORDER — SODIUM CHLORIDE 0.9 % IV SOLN
135.0000 mg/m2 | Freq: Once | INTRAVENOUS | Status: AC
  Administered 2018-10-13: 280 mg via INTRAVENOUS
  Filled 2018-10-13: qty 4

## 2018-10-13 MED ORDER — SODIUM CHLORIDE 0.9 % IV SOLN
1920.0000 mg/m2 | INTRAVENOUS | Status: DC
  Administered 2018-10-13: 3950 mg via INTRAVENOUS
  Filled 2018-10-13: qty 79

## 2018-10-13 MED ORDER — ATROPINE SULFATE 1 MG/ML IJ SOLN
INTRAMUSCULAR | Status: AC
  Filled 2018-10-13: qty 1

## 2018-10-13 MED ORDER — SODIUM CHLORIDE 0.9 % IV SOLN
400.0000 mg | Freq: Once | INTRAVENOUS | Status: AC
  Administered 2018-10-13: 400 mg via INTRAVENOUS
  Filled 2018-10-13: qty 16

## 2018-10-13 NOTE — Assessment & Plan Note (Signed)
1.  Stage IV colon cancer (pT3 pN1 M1) MSI-high: - K-ras mutation negative, BRAF V600 E+, PIK3CA,CDKN2A,TP53 - 12 cycles of FOLFOX from 08/26/2016 through 01/27/2017. -Opdivo from 02/23/2017 through 07/13/2017. -FOLFIRI and bevacizumab started on 07/27/2017. -Cycle 30 of FOLFIRI and bevacizumab on 09/15/2018, Irinotecan dose reduced at 135 mg per metered square and 20% reduction of 5-FU - CT CAP on 07/29/2018 suggest stable disease with minimal improvement. -Last CEA was 8.5 on 08/03/2018. - Labs are stable and adequate to proceed with treatment today.  We will plan to repeat CT chest abdomen pelvis after his next cycle. - Return to clinic in 2 weeks.  2.  Peripheral neuropathy: - He has tingling and numbness in the feet which is constant.  He will continue gabapentin 600 mg twice daily.  3.  Diarrhea: - His diarrhea has improved after we cut back Irinotecan to 135 mg per metered square. -He is taking 1 tablet of Imodium and 1 tablet of Lomotil twice daily.  If he takes more than that he get constipated.  4.  Weight loss: -He lost about 5 pounds since last visit 2 weeks ago. -She reports that he has been eating well but has been working outside the house in the last 2 weeks and hence failed to gain any weight.

## 2018-10-13 NOTE — Progress Notes (Signed)
Pt presents today for treatment with a f/u appointment with RNester NP. VS within parameters for treatment. MAR reviewed. Pt denies any pain today. Pt states, " I have no diarrhea anymore." Pt has complaints of numbness in his hands and feet which he states is the same. Labs pending.   Treatment given today per MD orders. Tolerated infusion without adverse affects. Vital signs stable. No complaints at this time. Discharged from clinic ambulatory. F/U with [facility] as scheduled.

## 2018-10-13 NOTE — Progress Notes (Signed)
[name], [name] [address]   CLINIC:  Medical Oncology/Hematology  PCP:  Tobe Sos, MD [address] 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[place])   07/27/2017 -  Chemotherapy   The patient had palonosetron (ALOXI) injection 0.25 mg, 0.25 mg, Intravenous,  Once, 32 of 33 cycles Administration: 0.25 mg (07/27/2017), 0.25 mg (08/10/2017), 0.25 mg (08/24/2017), 0.25 mg (09/07/2017), 0.25 mg (09/21/2017), 0.25 mg (10/05/2017), 0.25 mg (10/19/2017), 0.25 mg (11/02/2017), 0.25 mg (11/16/2017), 0.25 mg (11/30/2017), 0.25 mg (12/14/2017), 0.25 mg (12/28/2017), 0.25 mg (01/12/2018), 0.25 mg (01/26/2018), 0.25 mg (02/09/2018), 0.25 mg (02/24/2018), 0.25 mg (03/10/2018), 0.25 mg (03/29/2018), 0.25 mg (04/12/2018), 0.25 mg (04/26/2018), 0.25 mg (05/10/2018), 0.25 mg (05/24/2018), 0.25 mg (06/07/2018), 0.25 mg (06/21/2018), 0.25 mg (07/05/2018), 0.25 mg (07/19/2018), 0.25 mg (08/03/2018), 0.25 mg (08/18/2018), 0.25 mg (09/01/2018), 0.25 mg (09/15/2018), 0.25 mg (09/29/2018) pegfilgrastim-cbqv (UDENYCA) injection 6 mg, 6 mg, Subcutaneous, Once, 7 of 7 cycles Administration: 6 mg (10/07/2017), 6 mg (10/21/2017), 6 mg (11/04/2017), 6 mg (11/18/2017), 6 mg (12/02/2017), 6 mg (12/16/2017), 6 mg (12/30/2017) bevacizumab (AVASTIN) 450 mg in sodium chloride 0.9 % 100 mL chemo infusion, 5 mg/kg = 450 mg, Intravenous,  Once, 32 of 33 cycles Dose modification: 4 mg/kg (80 % of original dose 5 mg/kg, Cycle 5, Reason: Provider Judgment), 5 mg/kg (original dose 5 mg/kg, Cycle 16, Reason: Other (see comments)) Administration: 450 mg (07/27/2017), 450 mg (08/10/2017), 450 mg (08/24/2017), 450 mg (09/07/2017), 450 mg (09/21/2017), 450 mg (10/05/2017), 450 mg (10/19/2017), 450 mg  (11/02/2017), 450 mg (11/16/2017), 450 mg (11/30/2017), 450 mg (12/14/2017), 450 mg (12/28/2017), 400 mg (01/12/2018), 400 mg (01/26/2018), 400 mg (02/09/2018), 400 mg (02/24/2018), 400 mg (03/10/2018), 400 mg (03/29/2018), 400 mg (04/12/2018), 400 mg (04/26/2018), 400 mg (05/10/2018), 400 mg (05/24/2018), 400 mg (06/07/2018), 400 mg (06/21/2018), 400 mg (07/05/2018), 400 mg (07/19/2018), 400 mg (08/03/2018), 400 mg (08/18/2018), 400 mg (09/01/2018), 400 mg (09/15/2018), 400 mg (09/29/2018) irinotecan (CAMPTOSAR) 400 mg in dextrose 5 % 500 mL chemo infusion, 180 mg/m2 = 400 mg, Intravenous,  Once, 32 of 33 cycles Dose modification: 144 mg/m2 (80 % of original dose 180 mg/m2, Cycle 5, Reason: Provider Judgment), 135 mg/m2 (75 % of original dose 180 mg/m2, Cycle 30, Reason: Other (see comments), Comment: diarrhea) Administration: 400 mg (07/27/2017), 400 mg (08/10/2017), 400 mg (08/24/2017), 400 mg (09/07/2017), 320 mg (09/21/2017), 320 mg (10/05/2017), 320 mg (10/19/2017), 320 mg (11/02/2017), 320 mg (11/16/2017), 320 mg (11/30/2017), 320 mg (12/14/2017), 320 mg (12/28/2017), 320 mg (01/12/2018), 320 mg (01/26/2018), 320 mg (02/09/2018), 300 mg (02/24/2018), 300 mg (03/10/2018), 300 mg (03/29/2018), 300 mg (04/12/2018), 300 mg (04/26/2018), 300 mg (05/10/2018), 300 mg (05/24/2018), 300 mg (06/07/2018), 300 mg (06/21/2018), 300 mg (07/05/2018), 300 mg (07/19/2018), 300 mg (08/03/2018), 300 mg (08/18/2018), 300 mg (09/01/2018), 280 mg (09/15/2018), 280 mg (09/29/2018) leucovorin 800 mg in dextrose 5 % 250 mL infusion, 872 mg, Intravenous,  Once, 32 of 33 cycles Dose modification: 320 mg/m2 (80 % of original dose 400 mg/m2, Cycle 5, Reason: Provider Judgment) Administration: 800 mg (07/27/2017), 800 mg (08/10/2017), 800 mg (08/24/2017), 800 mg (09/07/2017), 700 mg (09/21/2017), 700 mg (10/05/2017), 700 mg (10/19/2017), 700 mg (11/02/2017), 700 mg (11/16/2017), 700 mg (11/30/2017), 700 mg (12/14/2017), 700 mg (12/28/2017),  700 mg (01/12/2018), 700 mg (01/26/2018), 700 mg (02/09/2018), 700 mg  (02/24/2018), 700 mg (03/10/2018), 700 mg (03/29/2018), 700 mg (04/12/2018), 700 mg (04/26/2018), 700 mg (05/10/2018), 700 mg (05/24/2018), 700 mg (06/07/2018), 700 mg (06/21/2018), 700 mg (07/05/2018), 700 mg (07/19/2018), 700 mg (08/03/2018), 700 mg (08/18/2018), 700 mg (09/01/2018), 700 mg (09/15/2018), 700 mg (09/29/2018) fluorouracil (ADRUCIL) chemo injection 850 mg, 400 mg/m2 = 850 mg, Intravenous,  Once, 32 of 33 cycles Dose modification: 320 mg/m2 (80 % of original dose 400 mg/m2, Cycle 5, Reason: Provider Judgment) Administration: 850 mg (07/27/2017), 850 mg (08/10/2017), 850 mg (08/24/2017), 850 mg (09/07/2017), 700 mg (09/21/2017), 700 mg (10/05/2017), 700 mg (10/19/2017), 700 mg (11/02/2017), 700 mg (11/16/2017), 700 mg (11/30/2017), 700 mg (12/14/2017), 700 mg (12/28/2017), 700 mg (01/12/2018), 700 mg (01/26/2018), 700 mg (02/09/2018), 650 mg (02/24/2018), 650 mg (03/10/2018), 650 mg (03/29/2018), 650 mg (04/12/2018), 650 mg (04/26/2018), 650 mg (05/10/2018), 650 mg (05/24/2018), 650 mg (06/07/2018), 650 mg (06/21/2018), 650 mg (07/05/2018), 650 mg (07/19/2018), 650 mg (08/03/2018), 650 mg (08/18/2018), 650 mg (09/01/2018), 650 mg (09/15/2018), 650 mg (09/29/2018) fluorouracil (ADRUCIL) 5,250 mg in sodium chloride 0.9 % 145 mL chemo infusion, 2,400 mg/m2 = 5,250 mg, Intravenous, 1 Day/Dose, 32 of 33 cycles Dose modification: 1,920 mg/m2 (80 % of original dose 2,400 mg/m2, Cycle 5, Reason: Provider Judgment) Administration: 5,250 mg (07/27/2017), 5,250 mg (08/10/2017), 5,250 mg (08/24/2017), 5,250 mg (09/07/2017), 4,200 mg (09/21/2017), 4,200 mg (10/05/2017), 4,200 mg (10/19/2017), 4,200 mg (11/02/2017), 4,200 mg (11/16/2017), 4,200 mg (11/30/2017), 4,200 mg (12/14/2017), 4,200 mg (12/28/2017), 4,200 mg (01/12/2018), 4,200 mg (01/26/2018), 4,200 mg (02/09/2018), 3,950 mg (02/24/2018), 3,950 mg (03/10/2018), 3,950 mg (03/29/2018), 3,950 mg (04/12/2018), 3,950 mg (04/26/2018), 3,950 mg (05/10/2018), 3,950 mg (05/24/2018), 3,950 mg (06/07/2018), 3,950 mg (06/21/2018), 3,950 mg  (07/05/2018), 3,950 mg (07/19/2018), 3,950 mg (08/03/2018), 3,950 mg (08/18/2018), 3,950 mg (09/01/2018), 3,950 mg (09/15/2018), 3,950 mg (09/29/2018)  for chemotherapy treatment.       CANCER STAGING: Cancer Staging Malignant neoplasm of transverse colon [facility]) Staging form: Colon and Rectum, AJCC 8th Edition - Clinical: cT3, cN1a, cM1 - Signed by Twana First, MD on 03/09/2017 - Pathologic: No stage assigned - Unsigned    INTERVAL HISTORY:  Mr. Fawver 85 y.o. male presents today for follow-up.  He reports overall doing well.  He denies any significant fatigue.  He states his diarrhea is now resolved with dose reduction of treatment.  His appetite is improving.  He states he is gaining weight.  He overall is doing well.  He states he is looking forward to his granddaughter moving in with him this weekend.  He states he is ready to proceed with treatment today.   REVIEW OF SYSTEMS:  Review of Systems  Constitutional: Positive for fatigue.  All other systems reviewed and are negative.    PAST MEDICAL/SURGICAL HISTORY:  Past Medical History:  Diagnosis Date  . Chronic pulmonary embolism ([place]) 71/0626   compliction of the valve replacement surgery  . COPD (chronic obstructive pulmonary disease) ([place])   . Diabetes ([place]) 04/22/2016  . H/O aortic valve replacement 10/2008  . Hypercholesteremia   . Hypertension   . Pulmonary emboli ([place]) 10/2008  . Sleep apnea    Past Surgical History:  Procedure Laterality Date  . AORTIC VALVE REPLACEMENT     2010  . CARDIAC CATHETERIZATION  2010  . COLONOSCOPY N/A 06/05/2016   Procedure: COLONOSCOPY;  Surgeon: Rogene Houston, MD;  Location: [facility] ENDO SUITE;  Service: Endoscopy;  Laterality: N/A;  . PARTIAL COLECTOMY  N/A 07/11/2016   Procedure: PARTIAL COLECTOMY;  Surgeon: Aviva Signs, MD;  Location: [facility] ORS;  Service: General;  Laterality: N/A;  . PORTACATH PLACEMENT N/A 08/13/2016   Procedure: INSERTION PORT-A-CATH LEFT SUBCLAVIAN;  Surgeon: Aviva Signs, MD;  Location: [facility] ORS;  Service: General;  Laterality: N/A;  . REPLACEMENT TOTAL KNEE     1996  . spenectomy     from trauma     SOCIAL HISTORY:  Social History   Socioeconomic History  . Marital status: Widowed    Spouse name: Not on file  . Number of children: Not on file  . Years of education: Not on file  . Highest education level: Not on file  Occupational History  . Not on file  Social Needs  . Financial resource strain: Not on file  . Food insecurity    Worry: Not on file    Inability: Not on file  . Transportation needs    Medical: Not on file    Non-medical: Not on file  Tobacco Use  . Smoking status: Former Smoker    Years: 15.00    Types: Cigarettes    Quit date: 1964    Years since quitting: 56.5  . Smokeless tobacco: Never Used  Substance and Sexual Activity  . Alcohol use: No  . Drug use: No  . Sexual activity: Yes  Lifestyle  . Physical activity    Days per week: Not on file    Minutes per session: Not on file  . Stress: Not on file  Relationships  . Social Herbalist on phone: Not on file    Gets together: Not on file    Attends religious service: Not on file    Active member of club or organization: Not on file    Attends meetings of clubs or organizations: Not on file    Relationship status: Not on file  . Intimate partner violence    Fear of current or ex partner: Not on file    Emotionally abused: Not on file    Physically abused: Not on file    Forced sexual activity: Not on file  Other Topics Concern  . Not on file  Social History Narrative  . Not on file    FAMILY HISTORY:  Family History  Problem Relation Age of Onset  . Colon cancer Neg Hx     CURRENT MEDICATIONS:  Outpatient Encounter Medications as of 10/13/2018  Medication Sig Note  . atorvastatin (LIPITOR) 10 MG tablet Take 10 mg by mouth at bedtime.    . Cholecalciferol (VITAMIN D3) 5000 units CAPS Take 5,000 Units by mouth daily.    .  diphenoxylate-atropine (LOMOTIL) 2.5-0.025 MG tablet Take 1 tablet by mouth 4 (four) times daily as needed for diarrhea or loose stools.   . gabapentin (NEURONTIN) 600 MG tablet Take 1 tablet (600 mg total) by mouth 2 (two) times daily.   Marland Kitchen lisinopril (PRINIVIL,ZESTRIL) 10 MG tablet Take 10 mg by mouth daily.   . metoprolol (LOPRESSOR) 50 MG tablet Take 50 mg by mouth 2 (two) times daily.   . Multiple Vitamins-Minerals (CENTRUM SILVER 50+MEN PO) Take 1 tablet by mouth daily.   Marland Kitchen NOVOLIN R FLEXPEN RELION 100 UNIT/ML SOPN INJECT 5 UNITS ONCE DAILY FOR GLUCOSE 150   . ondansetron (ZOFRAN) 4 MG tablet Take 4 mg by mouth 2 (two) times daily. Take one tablet two times a day as needed for nausea.   Marland Kitchen RELION GLUCOSE TEST STRIPS test  strip USE 1 STRIP TO CHECK GLUCOSE ONCE DAILY   . vitamin B-12 (CYANOCOBALAMIN) 1000 MCG tablet Take 1,000 mcg by mouth daily.    . [DISCONTINUED] prochlorperazine (COMPAZINE) 10 MG tablet Take 1 tablet (10 mg total) by mouth every 6 (six) hours as needed (Nausea or vomiting). 08/05/2016: New Med   Facility-Administered Encounter Medications as of 10/13/2018  Medication  . [COMPLETED] 0.9 %  sodium chloride infusion  . [COMPLETED] 0.9 %  sodium chloride infusion  . [COMPLETED] atropine injection 0.5 mg  . bevacizumab (AVASTIN) 400 mg in sodium chloride 0.9 % 100 mL chemo infusion  . dexamethasone (DECADRON) 10 mg in sodium chloride 0.9 % 50 mL IVPB  . fluorouracil (ADRUCIL) 3,950 mg in sodium chloride 0.9 % 71 mL chemo infusion  . fluorouracil (ADRUCIL) chemo injection 650 mg  . irinotecan (CAMPTOSAR) 280 mg in sodium chloride 0.9 % 500 mL chemo infusion  . leucovorin 700 mg in sodium chloride 0.9 % 250 mL infusion  . [COMPLETED] palonosetron (ALOXI) injection 0.25 mg  . sodium chloride flush (NS) 0.9 % injection 10 mL    ALLERGIES:  Allergies  Allergen Reactions  . Amoxicillin Hives    Has patient had a PCN reaction causing immediate rash, facial/tongue/throat  swelling, SOB or lightheadedness with hypotension: No Has patient had a PCN reaction causing severe rash involving mucus membranes or skin necrosis: Yes Has patient had a PCN reaction that required hospitalization No Has patient had a PCN reaction occurring within the last 10 years: No If all of the above answers are "NO", then may proceed with Cephalosporin use.   . Tamsulosin Hcl     Cant sleep, confusion      PHYSICAL EXAM:  ECOG Performance status: 1  There were no vitals filed for this visit. There were no vitals filed for this visit.  Physical Exam Vitals signs reviewed.  Constitutional:      Appearance: Normal appearance.  HENT:     Head: Normocephalic.     Nose: Nose normal.     Mouth/Throat:     Mouth: Mucous membranes are moist.     Pharynx: Oropharynx is clear.  Eyes:     Extraocular Movements: Extraocular movements intact.     Conjunctiva/sclera: Conjunctivae normal.  Neck:     Musculoskeletal: Normal range of motion.  Cardiovascular:     Rate and Rhythm: Normal rate and regular rhythm.     Pulses: Normal pulses.     Heart sounds: Normal heart sounds.  Pulmonary:     Effort: Pulmonary effort is normal.     Breath sounds: Normal breath sounds.  Abdominal:     General: Bowel sounds are normal.  Musculoskeletal: Normal range of motion.  Skin:    General: Skin is warm and dry.  Neurological:     General: No focal deficit present.     Mental Status: He is alert and oriented to person, place, and time.  Psychiatric:        Mood and Affect: Mood normal.        Behavior: Behavior normal.        Thought Content: Thought content normal.        Judgment: Judgment normal.      LABORATORY DATA:  I have reviewed the labs as listed.  CBC    Component Value Date/Time   WBC 8.4 10/13/2018 0820   RBC 3.22 (L) 10/13/2018 0820   HGB 10.9 (L) 10/13/2018 0820   HCT 34.9 (L) 10/13/2018 0820  PLT 163 10/13/2018 0820   MCV 108.4 (H) 10/13/2018 0820   MCH 33.9  10/13/2018 0820   MCHC 31.2 10/13/2018 0820   RDW 15.2 10/13/2018 0820   LYMPHSABS 2.3 10/13/2018 0820   MONOABS 1.0 10/13/2018 0820   EOSABS 0.2 10/13/2018 0820   BASOSABS 0.0 10/13/2018 0820   CMP Latest Ref Rng & Units 10/13/2018 09/29/2018 09/15/2018  Glucose 70 - 99 mg/dL 134(H) 174(H) 116(H)  BUN 8 - 23 mg/dL _0 Creatinine 0.61 - 1.24 mg/dL 0.97 0.97 0.91  Sodium 135 - 145 mmol/L 139 139 141  Potassium 3.5 - 5.1 mmol/L 4.2 4.0 4.0  Chloride 98 - 111 mmol/L 104 106 105  CO2 22 - 32 mmol/L _1 Calcium 8.9 - 10.3 mg/dL 9.0 8.9 9.0  Total Protein 6.5 - 8.1 g/dL 6.2(L) 6.8 6.4(L)  Total Bilirubin 0.3 - 1.2 mg/dL 1.0 1.0 1.1  Alkaline Phos 38 - 126 U/L 43 40 44  AST 15 - 41 U/L _2 ALT 0 - 44 U/L _3 ASSESSMENT & PLAN:   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HCC) 1.  Stage IV colon cancer (pT3 pN1 M1) MSI-high: - K-ras mutation negative, BRAF V600 E+, PIK3CA,CDKN2A,TP53 - 12 cycles of FOLFOX from 08/26/2016 through 01/27/2017. -Opdivo from 02/23/2017 through 07/13/2017. -FOLFIRI and bevacizumab started on 07/27/2017. -Cycle 30 of FOLFIRI and bevacizumab on 09/15/2018, Irinotecan dose reduced at 135 mg per metered square and 20% reduction of 5-FU - CT CAP on 07/29/2018 suggest stable disease with minimal improvement. -Last CEA was 8.5 on 08/03/2018. - Labs are stable and adequate to proceed with treatment today.  We will plan to repeat CT chest abdomen pelvis after his next cycle. - Return to clinic in 2 weeks.  2.  Peripheral neuropathy: - He has tingling and numbness in the feet which is constant.  He will continue gabapentin 600 mg twice daily.  3.  Diarrhea: - His diarrhea has improved after we cut back Irinotecan to 135 mg per metered square. -He is taking 1 tablet of Imodium and 1 tablet of Lomotil twice daily.  If he takes more than that he get constipated.  4.  Weight loss: -He lost about 5 pounds since last visit 2 weeks ago. -She reports  that he has been eating well but has been working outside the house in the last 2 weeks and hence failed to gain any weight.      Orders placed this encounter:  Orders Placed This Encounter  Procedures  . CBC with Differential  . Comprehensive metabolic panel  . Urinalysis, dipstick only  . [place] [phone]

## 2018-10-15 ENCOUNTER — Inpatient Hospital Stay (HOSPITAL_COMMUNITY): Payer: Medicare Other

## 2018-10-15 ENCOUNTER — Encounter (HOSPITAL_COMMUNITY): Payer: Self-pay

## 2018-10-15 ENCOUNTER — Other Ambulatory Visit: Payer: Self-pay

## 2018-10-15 VITALS — BP 153/75 | HR 62 | Temp 97.8°F | Resp 18

## 2018-10-15 DIAGNOSIS — Z95828 Presence of other vascular implants and grafts: Secondary | ICD-10-CM

## 2018-10-15 DIAGNOSIS — Z5111 Encounter for antineoplastic chemotherapy: Secondary | ICD-10-CM | POA: Diagnosis not present

## 2018-10-15 DIAGNOSIS — C184 Malignant neoplasm of transverse colon: Secondary | ICD-10-CM

## 2018-10-15 MED ORDER — HEPARIN SOD (PORK) LOCK FLUSH 100 UNIT/ML IV SOLN
500.0000 [IU] | Freq: Once | INTRAVENOUS | Status: AC | PRN
  Administered 2018-10-15: 500 [IU]

## 2018-10-15 MED ORDER — SODIUM CHLORIDE 0.9% FLUSH
10.0000 mL | INTRAVENOUS | Status: DC | PRN
  Administered 2018-10-15: 10 mL
  Filled 2018-10-15: qty 10

## 2018-10-15 NOTE — Progress Notes (Signed)
Kevin Kirk tolerated 5FU pump well without complaints or incident. 5FU pump discontinued with portacath flushed easily per protocol then de-accessed. VSS.Pt discharged self ambulatory using his cane in satisfactory condition

## 2018-10-15 NOTE — Patient Instructions (Signed)
[place] at [facility] Discharge Instructions 5FU pump discontinued with portacath flushed today. Follow-up as scheduled. Call clinic for any questions or conerns   Thank you for choosing [place] at [facility] to provide your oncology and hematology care.  To afford each patient quality time with our provider, please arrive at least 15 minutes before your scheduled appointment time.   If you have a lab appointment with the [facility] please come in thru the  Main Entrance and check in at the main information desk  You need to re-schedule your appointment should you arrive 10 or more minutes late.  We strive to give you quality time with our providers, and arriving late affects you and other patients whose appointments are after yours.  Also, if you no show three or more times for appointments you may be dismissed from the clinic at the providers discretion.     Again, thank you for choosing [facility].  Our hope is that these requests will decrease the amount of time that you wait before being seen by our physicians.       _____________________________________________________________  Should you have questions after your visit to [facility], please contact our office at [phone] between the hours of 8:00 a.m. and 4:30 p.m.  Voicemails left after 4:00 p.m. will not be returned until the following business day.  For prescription refill requests, have your pharmacy contact our office and allow 72 hours.    Cancer Center Support Programs:   > Cancer Support Group  2nd Tuesday of the month 1pm-2pm, Journey Room

## 2018-10-27 ENCOUNTER — Inpatient Hospital Stay (HOSPITAL_BASED_OUTPATIENT_CLINIC_OR_DEPARTMENT_OTHER): Payer: Medicare Other | Admitting: Hematology

## 2018-10-27 ENCOUNTER — Other Ambulatory Visit: Payer: Self-pay

## 2018-10-27 ENCOUNTER — Inpatient Hospital Stay (HOSPITAL_COMMUNITY): Payer: Medicare Other | Attending: Hematology

## 2018-10-27 ENCOUNTER — Encounter (HOSPITAL_COMMUNITY): Payer: Self-pay | Admitting: Hematology

## 2018-10-27 ENCOUNTER — Inpatient Hospital Stay (HOSPITAL_COMMUNITY): Payer: Medicare Other

## 2018-10-27 VITALS — BP 148/63 | HR 50 | Temp 97.6°F | Resp 18

## 2018-10-27 VITALS — BP 127/64 | HR 67 | Temp 97.5°F | Resp 18 | Wt 164.0 lb

## 2018-10-27 DIAGNOSIS — R634 Abnormal weight loss: Secondary | ICD-10-CM | POA: Diagnosis not present

## 2018-10-27 DIAGNOSIS — Z5111 Encounter for antineoplastic chemotherapy: Secondary | ICD-10-CM | POA: Diagnosis present

## 2018-10-27 DIAGNOSIS — Z87891 Personal history of nicotine dependence: Secondary | ICD-10-CM | POA: Insufficient documentation

## 2018-10-27 DIAGNOSIS — Z79899 Other long term (current) drug therapy: Secondary | ICD-10-CM | POA: Insufficient documentation

## 2018-10-27 DIAGNOSIS — E1142 Type 2 diabetes mellitus with diabetic polyneuropathy: Secondary | ICD-10-CM | POA: Insufficient documentation

## 2018-10-27 DIAGNOSIS — C184 Malignant neoplasm of transverse colon: Secondary | ICD-10-CM

## 2018-10-27 DIAGNOSIS — Z95828 Presence of other vascular implants and grafts: Secondary | ICD-10-CM

## 2018-10-27 DIAGNOSIS — R197 Diarrhea, unspecified: Secondary | ICD-10-CM | POA: Insufficient documentation

## 2018-10-27 LAB — CBC WITH DIFFERENTIAL/PLATELET
Abs Immature Granulocytes: 0.01 10*3/uL (ref 0.00–0.07)
Basophils Absolute: 0 10*3/uL (ref 0.0–0.1)
Basophils Relative: 1 %
Eosinophils Absolute: 0.2 10*3/uL (ref 0.0–0.5)
Eosinophils Relative: 3 %
HCT: 34.9 % — ABNORMAL LOW (ref 39.0–52.0)
Hemoglobin: 11.2 g/dL — ABNORMAL LOW (ref 13.0–17.0)
Immature Granulocytes: 0 %
Lymphocytes Relative: 40 %
Lymphs Abs: 2.5 10*3/uL (ref 0.7–4.0)
MCH: 34.6 pg — ABNORMAL HIGH (ref 26.0–34.0)
MCHC: 32.1 g/dL (ref 30.0–36.0)
MCV: 107.7 fL — ABNORMAL HIGH (ref 80.0–100.0)
Monocytes Absolute: 0.9 10*3/uL (ref 0.1–1.0)
Monocytes Relative: 15 %
Neutro Abs: 2.6 10*3/uL (ref 1.7–7.7)
Neutrophils Relative %: 41 %
Platelets: 173 10*3/uL (ref 150–400)
RBC: 3.24 MIL/uL — ABNORMAL LOW (ref 4.22–5.81)
RDW: 15.4 % (ref 11.5–15.5)
WBC: 6.2 10*3/uL (ref 4.0–10.5)
nRBC: 0.3 % — ABNORMAL HIGH (ref 0.0–0.2)

## 2018-10-27 LAB — COMPREHENSIVE METABOLIC PANEL
ALT: 23 U/L (ref 0–44)
AST: 30 U/L (ref 15–41)
Albumin: 3.5 g/dL (ref 3.5–5.0)
Alkaline Phosphatase: 49 U/L (ref 38–126)
Anion gap: 9 (ref 5–15)
BUN: 18 mg/dL (ref 8–23)
CO2: 24 mmol/L (ref 22–32)
Calcium: 8.9 mg/dL (ref 8.9–10.3)
Chloride: 107 mmol/L (ref 98–111)
Creatinine, Ser: 0.89 mg/dL (ref 0.61–1.24)
GFR calc Af Amer: >60 mL/min (ref >60)
GFR calc non Af Amer: >60 mL/min (ref >60)
Glucose, Bld: 143 mg/dL — ABNORMAL HIGH (ref 70–99)
Potassium: 4 mmol/L (ref 3.5–5.1)
Sodium: 140 mmol/L (ref 135–145)
Total Bilirubin: 0.6 mg/dL (ref 0.3–1.2)
Total Protein: 6.6 g/dL (ref 6.5–8.1)

## 2018-10-27 MED ORDER — LIDOCAINE-PRILOCAINE 2.5-2.5 % EX CREA: 1.0000 "application " | TOPICAL_CREAM | CUTANEOUS | 3 refills | Status: DC | PRN

## 2018-10-27 MED ORDER — SODIUM CHLORIDE 0.9 % IV SOLN
700.0000 mg | Freq: Once | INTRAVENOUS | Status: AC
  Administered 2018-10-27: 700 mg via INTRAVENOUS
  Filled 2018-10-27: qty 35

## 2018-10-27 MED ORDER — SODIUM CHLORIDE 0.9 % IV SOLN
10.0000 mg | Freq: Once | INTRAVENOUS | Status: AC
  Administered 2018-10-27: 10 mg via INTRAVENOUS
  Filled 2018-10-27: qty 10

## 2018-10-27 MED ORDER — SODIUM CHLORIDE 0.9 % IV SOLN
400.0000 mg | Freq: Once | INTRAVENOUS | Status: AC
  Administered 2018-10-27: 400 mg via INTRAVENOUS
  Filled 2018-10-27: qty 16

## 2018-10-27 MED ORDER — ATROPINE SULFATE 1 MG/ML IJ SOLN: 0.5000 mg | Freq: Once | INTRAMUSCULAR | Status: DC

## 2018-10-27 MED ORDER — SODIUM CHLORIDE 0.9 % IV SOLN
1920.0000 mg/m2 | INTRAVENOUS | Status: DC
  Administered 2018-10-27: 3950 mg via INTRAVENOUS
  Filled 2018-10-27: qty 79

## 2018-10-27 MED ORDER — SODIUM CHLORIDE 0.9 % IV SOLN
Freq: Once | INTRAVENOUS | Status: AC
  Administered 2018-10-27: 10:00:00 via INTRAVENOUS

## 2018-10-27 MED ORDER — PALONOSETRON HCL INJECTION 0.25 MG/5ML
0.2500 mg | Freq: Once | INTRAVENOUS | Status: AC
  Administered 2018-10-27: 0.25 mg via INTRAVENOUS
  Filled 2018-10-27: qty 5

## 2018-10-27 MED ORDER — SODIUM CHLORIDE 0.9% FLUSH
10.0000 mL | INTRAVENOUS | Status: DC | PRN
  Administered 2018-10-27: 10 mL
  Filled 2018-10-27: qty 10

## 2018-10-27 MED ORDER — FLUOROURACIL CHEMO INJECTION 2.5 GM/50ML
320.0000 mg/m2 | Freq: Once | INTRAVENOUS | Status: AC
  Administered 2018-10-27: 650 mg via INTRAVENOUS
  Filled 2018-10-27: qty 13

## 2018-10-27 NOTE — Patient Instructions (Signed)
[facility] at [facility] Discharge Instructions  You were seen today by Dr. Katragadda. He went over your recent lab results. He will see you back in 2 weeks for labs and follow up.   Thank you for choosing [facility] at [facility] to provide your oncology and hematology care.  To afford each patient quality time with our provider, please arrive at least 15 minutes before your scheduled appointment time.   If you have a lab appointment with the Cancer Center please come in thru the  Main Entrance and check in at the main information desk  You need to re-schedule your appointment should you arrive 10 or more minutes late.  We strive to give you quality time with our providers, and arriving late affects you and other patients whose appointments are after yours.  Also, if you no show three or more times for appointments you may be dismissed from the clinic at the providers discretion.     Again, thank you for choosing [facility].  Our hope is that these requests will decrease the amount of time that you wait before being seen by our physicians.       _____________________________________________________________  Should you have questions after your visit to [facility], please contact our office at [phone] between the hours of 8:00 a.m. and 4:30 p.m.  Voicemails left after 4:00 p.m. will not be returned until the following business day.  For prescription refill requests, have your pharmacy contact our office and allow 72 hours.    Cancer Center Support Programs:   > Cancer Support Group  2nd Tuesday of the month 1pm-2pm, Journey Room    

## 2018-10-27 NOTE — Progress Notes (Signed)
[place], [place] [address]   CLINIC:  Medical Oncology/Hematology  PCP:  Tobe Sos, MD [address] 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[place])   07/27/2017 -  Chemotherapy   The patient had palonosetron (ALOXI) injection 0.25 mg, 0.25 mg, Intravenous,  Once, 33 of 38 cycles Administration: 0.25 mg (07/27/2017), 0.25 mg (08/10/2017), 0.25 mg (08/24/2017), 0.25 mg (09/07/2017), 0.25 mg (09/21/2017), 0.25 mg (10/05/2017), 0.25 mg (10/19/2017), 0.25 mg (11/02/2017), 0.25 mg (11/16/2017), 0.25 mg (11/30/2017), 0.25 mg (12/14/2017), 0.25 mg (12/28/2017), 0.25 mg (01/12/2018), 0.25 mg (01/26/2018), 0.25 mg (02/09/2018), 0.25 mg (02/24/2018), 0.25 mg (03/10/2018), 0.25 mg (03/29/2018), 0.25 mg (04/12/2018), 0.25 mg (04/26/2018), 0.25 mg (05/10/2018), 0.25 mg (05/24/2018), 0.25 mg (06/07/2018), 0.25 mg (06/21/2018), 0.25 mg (07/05/2018), 0.25 mg (07/19/2018), 0.25 mg (08/03/2018), 0.25 mg (08/18/2018), 0.25 mg (09/01/2018), 0.25 mg (09/15/2018), 0.25 mg (09/29/2018), 0.25 mg (10/13/2018), 0.25 mg (10/27/2018) pegfilgrastim-cbqv (UDENYCA) injection 6 mg, 6 mg, Subcutaneous, Once, 7 of 7 cycles Administration: 6 mg (10/07/2017), 6 mg (10/21/2017), 6 mg (11/04/2017), 6 mg (11/18/2017), 6 mg (12/02/2017), 6 mg (12/16/2017), 6 mg (12/30/2017) bevacizumab (AVASTIN) 450 mg in sodium chloride 0.9 % 100 mL chemo infusion, 5 mg/kg = 450 mg, Intravenous,  Once, 33 of 38 cycles Dose modification: 4 mg/kg (80 % of original dose 5 mg/kg, Cycle 5, Reason: Provider Judgment), 5 mg/kg (original dose 5 mg/kg, Cycle 16, Reason: Other (see comments)) Administration: 450 mg (07/27/2017), 450 mg (08/10/2017), 450 mg (08/24/2017), 450 mg (09/07/2017), 450 mg (09/21/2017), 450 mg  (10/05/2017), 450 mg (10/19/2017), 450 mg (11/02/2017), 450 mg (11/16/2017), 450 mg (11/30/2017), 450 mg (12/14/2017), 450 mg (12/28/2017), 400 mg (01/12/2018), 400 mg (01/26/2018), 400 mg (02/09/2018), 400 mg (02/24/2018), 400 mg (03/10/2018), 400 mg (03/29/2018), 400 mg (04/12/2018), 400 mg (04/26/2018), 400 mg (05/10/2018), 400 mg (05/24/2018), 400 mg (06/07/2018), 400 mg (06/21/2018), 400 mg (07/05/2018), 400 mg (07/19/2018), 400 mg (08/03/2018), 400 mg (08/18/2018), 400 mg (09/01/2018), 400 mg (09/15/2018), 400 mg (09/29/2018), 400 mg (10/13/2018), 400 mg (10/27/2018) irinotecan (CAMPTOSAR) 400 mg in dextrose 5 % 500 mL chemo infusion, 180 mg/m2 = 400 mg, Intravenous,  Once, 32 of 37 cycles Dose modification: 144 mg/m2 (80 % of original dose 180 mg/m2, Cycle 5, Reason: Provider Judgment), 135 mg/m2 (75 % of original dose 180 mg/m2, Cycle 30, Reason: Other (see comments), Comment: diarrhea) Administration: 400 mg (07/27/2017), 400 mg (08/10/2017), 400 mg (08/24/2017), 400 mg (09/07/2017), 320 mg (09/21/2017), 320 mg (10/05/2017), 320 mg (10/19/2017), 320 mg (11/02/2017), 320 mg (11/16/2017), 320 mg (11/30/2017), 320 mg (12/14/2017), 320 mg (12/28/2017), 320 mg (01/12/2018), 320 mg (01/26/2018), 320 mg (02/09/2018), 300 mg (02/24/2018), 300 mg (03/10/2018), 300 mg (03/29/2018), 300 mg (04/12/2018), 300 mg (04/26/2018), 300 mg (05/10/2018), 300 mg (05/24/2018), 300 mg (06/07/2018), 300 mg (06/21/2018), 300 mg (07/05/2018), 300 mg (07/19/2018), 300 mg (08/03/2018), 300 mg (08/18/2018), 300 mg (09/01/2018), 280 mg (09/15/2018), 280 mg (09/29/2018), 280 mg (10/13/2018) leucovorin 800 mg in dextrose 5 % 250 mL infusion, 872 mg, Intravenous,  Once, 33 of 38 cycles Dose modification: 320 mg/m2 (80 % of original dose 400 mg/m2, Cycle 5, Reason: Provider Judgment) Administration: 800 mg (07/27/2017), 800 mg (08/10/2017), 800 mg (08/24/2017), 800 mg (09/07/2017), 700 mg (09/21/2017), 700 mg (10/05/2017), 700 mg (10/19/2017),  700 mg (11/02/2017), 700 mg (11/16/2017), 700 mg (11/30/2017), 700 mg  (12/14/2017), 700 mg (12/28/2017), 700 mg (01/12/2018), 700 mg (01/26/2018), 700 mg (02/09/2018), 700 mg (02/24/2018), 700 mg (03/10/2018), 700 mg (03/29/2018), 700 mg (04/12/2018), 700 mg (04/26/2018), 700 mg (05/10/2018), 700 mg (05/24/2018), 700 mg (06/07/2018), 700 mg (06/21/2018), 700 mg (07/05/2018), 700 mg (07/19/2018), 700 mg (08/03/2018), 700 mg (08/18/2018), 700 mg (09/01/2018), 700 mg (09/15/2018), 700 mg (09/29/2018), 700 mg (10/13/2018), 700 mg (10/27/2018) fluorouracil (ADRUCIL) chemo injection 850 mg, 400 mg/m2 = 850 mg, Intravenous,  Once, 33 of 38 cycles Dose modification: 320 mg/m2 (80 % of original dose 400 mg/m2, Cycle 5, Reason: Provider Judgment) Administration: 850 mg (07/27/2017), 850 mg (08/10/2017), 850 mg (08/24/2017), 850 mg (09/07/2017), 700 mg (09/21/2017), 700 mg (10/05/2017), 700 mg (10/19/2017), 700 mg (11/02/2017), 700 mg (11/16/2017), 700 mg (11/30/2017), 700 mg (12/14/2017), 700 mg (12/28/2017), 700 mg (01/12/2018), 700 mg (01/26/2018), 700 mg (02/09/2018), 650 mg (02/24/2018), 650 mg (03/10/2018), 650 mg (03/29/2018), 650 mg (04/12/2018), 650 mg (04/26/2018), 650 mg (05/10/2018), 650 mg (05/24/2018), 650 mg (06/07/2018), 650 mg (06/21/2018), 650 mg (07/05/2018), 650 mg (07/19/2018), 650 mg (08/03/2018), 650 mg (08/18/2018), 650 mg (09/01/2018), 650 mg (09/15/2018), 650 mg (09/29/2018), 650 mg (10/13/2018), 650 mg (10/27/2018) fluorouracil (ADRUCIL) 5,250 mg in sodium chloride 0.9 % 145 mL chemo infusion, 2,400 mg/m2 = 5,250 mg, Intravenous, 1 Day/Dose, 33 of 38 cycles Dose modification: 1,920 mg/m2 (80 % of original dose 2,400 mg/m2, Cycle 5, Reason: Provider Judgment) Administration: 5,250 mg (07/27/2017), 5,250 mg (08/10/2017), 5,250 mg (08/24/2017), 5,250 mg (09/07/2017), 4,200 mg (09/21/2017), 4,200 mg (10/05/2017), 4,200 mg (10/19/2017), 4,200 mg (11/02/2017), 4,200 mg (11/16/2017), 4,200 mg (11/30/2017), 4,200 mg (12/14/2017), 4,200 mg (12/28/2017), 4,200 mg (01/12/2018), 4,200 mg (01/26/2018), 4,200 mg (02/09/2018), 3,950 mg (02/24/2018), 3,950 mg  (03/10/2018), 3,950 mg (03/29/2018), 3,950 mg (04/12/2018), 3,950 mg (04/26/2018), 3,950 mg (05/10/2018), 3,950 mg (05/24/2018), 3,950 mg (06/07/2018), 3,950 mg (06/21/2018), 3,950 mg (07/05/2018), 3,950 mg (07/19/2018), 3,950 mg (08/03/2018), 3,950 mg (08/18/2018), 3,950 mg (09/01/2018), 3,950 mg (09/15/2018), 3,950 mg (09/29/2018), 3,950 mg (10/13/2018), 3,950 mg (10/27/2018)  for chemotherapy treatment.       CANCER STAGING: Cancer Staging Malignant neoplasm of transverse colon [facility]) Staging form: Colon and Rectum, AJCC 8th Edition - Clinical: cT3, cN1a, cM1 - Signed by Twana First, MD on 03/09/2017 - Pathologic: No stage assigned - Unsigned    INTERVAL HISTORY:  Mr. Rossin 85 y.o. male seen for follow-up of stage IV colon cancer.  He had a rough time after last cycle of treatment 2 weeks ago.  He developed sores in the mouth.  This lasted few days and could not eat well.  He also experienced more fatigue last time.  Denied any bleeding.  Denied any nausea vomiting.  Diarrhea is controlled well with pills.  No fevers or infections noted.  Appetite is 100%.  Energy levels are 50%.  He lost about 1 pound in the last 2 weeks.   REVIEW OF SYSTEMS:  Review of Systems  Constitutional: Positive for fatigue.  HENT:   Positive for mouth sores.   All other systems reviewed and are negative.    PAST MEDICAL/SURGICAL HISTORY:  Past Medical History:  Diagnosis Date  . Chronic pulmonary embolism ([place]) 30/1601   compliction of the valve replacement surgery  . COPD (chronic obstructive pulmonary disease) ([place])   . Diabetes ([place]) 04/22/2016  . H/O aortic valve replacement 10/2008  . Hypercholesteremia   . Hypertension   . Pulmonary emboli ([place]) 10/2008  . Sleep  apnea    Past Surgical History:  Procedure Laterality Date  . AORTIC VALVE REPLACEMENT     2010  . CARDIAC CATHETERIZATION  2010  . COLONOSCOPY N/A 06/05/2016   Procedure: COLONOSCOPY;  Surgeon: Rogene Houston, MD;  Location: [facility] ENDO SUITE;   Service: Endoscopy;  Laterality: N/A;  . PARTIAL COLECTOMY N/A 07/11/2016   Procedure: PARTIAL COLECTOMY;  Surgeon: Aviva Signs, MD;  Location: [facility] ORS;  Service: General;  Laterality: N/A;  . PORTACATH PLACEMENT N/A 08/13/2016   Procedure: INSERTION PORT-A-CATH LEFT SUBCLAVIAN;  Surgeon: Aviva Signs, MD;  Location: [facility] ORS;  Service: General;  Laterality: N/A;  . REPLACEMENT TOTAL KNEE     1996  . spenectomy     from trauma     SOCIAL HISTORY:  Social History   Socioeconomic History  . Marital status: Widowed    Spouse name: Not on file  . Number of children: Not on file  . Years of education: Not on file  . Highest education level: Not on file  Occupational History  . Not on file  Social Needs  . Financial resource strain: Not on file  . Food insecurity    Worry: Not on file    Inability: Not on file  . Transportation needs    Medical: Not on file    Non-medical: Not on file  Tobacco Use  . Smoking status: Former Smoker    Years: 15.00    Types: Cigarettes    Quit date: 1964    Years since quitting: 56.6  . Smokeless tobacco: Never Used  Substance and Sexual Activity  . Alcohol use: No  . Drug use: No  . Sexual activity: Yes  Lifestyle  . Physical activity    Days per week: Not on file    Minutes per session: Not on file  . Stress: Not on file  Relationships  . Social Herbalist on phone: Not on file    Gets together: Not on file    Attends religious service: Not on file    Active member of club or organization: Not on file    Attends meetings of clubs or organizations: Not on file    Relationship status: Not on file  . Intimate partner violence    Fear of current or ex partner: Not on file    Emotionally abused: Not on file    Physically abused: Not on file    Forced sexual activity: Not on file  Other Topics Concern  . Not on file  Social History Narrative  . Not on file    FAMILY HISTORY:  Family History  Problem Relation Age of Onset   . Colon cancer Neg Hx     CURRENT MEDICATIONS:  Outpatient Encounter Medications as of 10/27/2018  Medication Sig Note  . atorvastatin (LIPITOR) 10 MG tablet Take 10 mg by mouth at bedtime.    . Cholecalciferol (VITAMIN D3) 5000 units CAPS Take 5,000 Units by mouth daily.    . diphenoxylate-atropine (LOMOTIL) 2.5-0.025 MG tablet Take 1 tablet by mouth 4 (four) times daily as needed for diarrhea or loose stools.   . gabapentin (NEURONTIN) 600 MG tablet Take 1 tablet (600 mg total) by mouth 2 (two) times daily.   Marland Kitchen lidocaine-prilocaine (EMLA) cream Apply 1 application topically as needed.   Marland Kitchen lisinopril (PRINIVIL,ZESTRIL) 10 MG tablet Take 10 mg by mouth daily.   . metoprolol (LOPRESSOR) 50 MG tablet Take 50 mg by mouth 2 (two) times daily.   Marland Kitchen  Multiple Vitamins-Minerals (CENTRUM SILVER 50+MEN PO) Take 1 tablet by mouth daily.   Marland Kitchen NOVOLIN R FLEXPEN RELION 100 UNIT/ML SOPN INJECT 5 UNITS ONCE DAILY FOR GLUCOSE 150   . ondansetron (ZOFRAN) 4 MG tablet Take 4 mg by mouth 2 (two) times daily. Take one tablet two times a day as needed for nausea.   Marland Kitchen RELION GLUCOSE TEST STRIPS test strip USE 1 STRIP TO CHECK GLUCOSE ONCE DAILY   . vitamin B-12 (CYANOCOBALAMIN) 1000 MCG tablet Take 1,000 mcg by mouth daily.    . [DISCONTINUED] prochlorperazine (COMPAZINE) 10 MG tablet Take 1 tablet (10 mg total) by mouth every 6 (six) hours as needed (Nausea or vomiting). 08/05/2016: New Med   Facility-Administered Encounter Medications as of 10/27/2018  Medication  . sodium chloride flush (NS) 0.9 % injection 10 mL    ALLERGIES:  Allergies  Allergen Reactions  . Amoxicillin Hives    Has patient had a PCN reaction causing immediate rash, facial/tongue/throat swelling, SOB or lightheadedness with hypotension: No Has patient had a PCN reaction causing severe rash involving mucus membranes or skin necrosis: Yes Has patient had a PCN reaction that required hospitalization No Has patient had a PCN reaction occurring  within the last 10 years: No If all of the above answers are "NO", then may proceed with Cephalosporin use.   . Tamsulosin Hcl     Cant sleep, confusion      PHYSICAL EXAM:  ECOG Performance status: 1  Vitals:   10/27/18 0838  BP: 127/64  Pulse: 67  Resp: 18  Temp: (!) 97.5 F (36.4 C)  SpO2: 99%   Filed Weights   10/27/18 0838  Weight: 164 lb (74.4 kg)    Physical Exam Vitals signs reviewed.  Constitutional:      Appearance: Normal appearance.  HENT:     Head: Normocephalic.     Nose: Nose normal.     Mouth/Throat:     Mouth: Mucous membranes are moist.     Pharynx: Oropharynx is clear.  Eyes:     Extraocular Movements: Extraocular movements intact.     Conjunctiva/sclera: Conjunctivae normal.  Neck:     Musculoskeletal: Normal range of motion.  Cardiovascular:     Rate and Rhythm: Normal rate and regular rhythm.     Pulses: Normal pulses.     Heart sounds: Normal heart sounds.  Pulmonary:     Effort: Pulmonary effort is normal.     Breath sounds: Normal breath sounds.  Abdominal:     General: Bowel sounds are normal.  Musculoskeletal: Normal range of motion.  Skin:    General: Skin is warm and dry.  Neurological:     General: No focal deficit present.     Mental Status: He is alert and oriented to person, place, and time.  Psychiatric:        Mood and Affect: Mood normal.        Behavior: Behavior normal.        Thought Content: Thought content normal.        Judgment: Judgment normal.      LABORATORY DATA:  I have reviewed the labs as listed.  CBC    Component Value Date/Time   WBC 6.2 10/27/2018 0837   RBC 3.24 (L) 10/27/2018 0837   HGB 11.2 (L) 10/27/2018 0837   HCT 34.9 (L) 10/27/2018 0837   PLT 173 10/27/2018 0837   MCV 107.7 (H) 10/27/2018 0837   MCH 34.6 (H) 10/27/2018 0837   MCHC 32.1  10/27/2018 0837   RDW 15.4 10/27/2018 0837   LYMPHSABS 2.5 10/27/2018 0837   MONOABS 0.9 10/27/2018 0837   EOSABS 0.2 10/27/2018 0837   BASOSABS  0.0 10/27/2018 0837   CMP Latest Ref Rng & Units 10/27/2018 10/13/2018 09/29/2018  Glucose 70 - 99 mg/dL 143(H) 134(H) 174(H)  BUN 8 - 23 mg/dL '18 15 20  ' Creatinine 0.61 - 1.24 mg/dL 0.89 0.97 0.97  Sodium 135 - 145 mmol/L 140 139 139  Potassium 3.5 - 5.1 mmol/L 4.0 4.2 4.0  Chloride 98 - 111 mmol/L 107 104 106  CO2 22 - 32 mmol/L '24 27 25  ' Calcium 8.9 - 10.3 mg/dL 8.9 9.0 8.9  Total Protein 6.5 - 8.1 g/dL 6.6 6.2(L) 6.8  Total Bilirubin 0.3 - 1.2 mg/dL 0.6 1.0 1.0  Alkaline Phos 38 - 126 U/L 49 43 40  AST 15 - 41 U/L '30 31 25  ' ALT 0 - 44 U/L '23 26 17     ' I have independently reviewed his scans and discussed with the patient.   ASSESSMENT & PLAN:   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[place]) 1.  Stage IV colon cancer (pT3 pN1 M1) MSI-high: - K-ras mutation negative, BRAF V600 E+, PIK3CA,CDKN2A,TP53 - 12 cycles of FOLFOX from 08/26/2016 through 01/27/2017. -Opdivo from 02/23/2017 through 07/13/2017. -FOLFIRI and bevacizumab started on 07/27/2017. -CT CAP on 07/29/2018 suggest stable disease with minimal improvement.  Last CEA was 8.5 on 08/03/2018. - He had excessive tiredness after last treatment.  He also had jaw soreness for 1 week and had difficulty eating. - He lost about 1 pound since last visit 2 weeks ago.  I have recommended holding Irinotecan for this cycle and see how he does. -We will also schedule him for CT scan of CAP prior to next visit.  If he continues to have stable disease, we will put him on maintenance 5-FU and bevacizumab.   2.  Peripheral neuropathy: - Tingling and numbness in the feet is constant.  He will continue gabapentin 600 mg twice daily.  3.  Diarrhea: - He is taking 1 tablet of Imodium and 1 tablet of Lomotil twice daily.  4.  Weight loss: -He lost 1 pound since last visit 2 weeks ago.  He lost 5 pounds prior to that. -I have eliminated Irinotecan today.   Total time spent is 25 minutes with more than 50% of the time spent discussing treatment plan  changes, counseling and coordination of care.  Orders placed this encounter:  Orders Placed This Encounter  Procedures  . CT Abdomen Pelvis W Contrast  . CT Chest W Contrast  . CEA  . CBC with Differential/Platelet  . Comprehensive metabolic panel  . [place] [phone]

## 2018-10-27 NOTE — Assessment & Plan Note (Signed)
1.  Stage IV colon cancer (pT3 pN1 M1) MSI-high: - K-ras mutation negative, BRAF V600 E+, PIK3CA,CDKN2A,TP53 - 12 cycles of FOLFOX from 08/26/2016 through 01/27/2017. -Opdivo from 02/23/2017 through 07/13/2017. -FOLFIRI and bevacizumab started on 07/27/2017. -CT CAP on 07/29/2018 suggest stable disease with minimal improvement.  Last CEA was 8.5 on 08/03/2018. - He had excessive tiredness after last treatment.  He also had jaw soreness for 1 week and had difficulty eating. - He lost about 1 pound since last visit 2 weeks ago.  I have recommended holding Irinotecan for this cycle and see how he does. -We will also schedule him for CT scan of CAP prior to next visit.  If he continues to have stable disease, we will put him on maintenance 5-FU and bevacizumab.   2.  Peripheral neuropathy: - Tingling and numbness in the feet is constant.  He will continue gabapentin 600 mg twice daily.  3.  Diarrhea: - He is taking 1 tablet of Imodium and 1 tablet of Lomotil twice daily.  4.  Weight loss: -He lost 1 pound since last visit 2 weeks ago.  He lost 5 pounds prior to that. -I have eliminated Irinotecan today.

## 2018-10-27 NOTE — Patient Instructions (Signed)
[facility] Discharge Instructions for Patients Receiving Chemotherapy  Today you received the following chemotherapy agents   To help prevent nausea and vomiting after your treatment, we encourage you to take your nausea medication   If you develop nausea and vomiting that is not controlled by your nausea medication, call the clinic.   BELOW ARE SYMPTOMS THAT SHOULD BE REPORTED IMMEDIATELY:  *FEVER GREATER THAN 100.5 F  *CHILLS WITH OR WITHOUT FEVER  NAUSEA AND VOMITING THAT IS NOT CONTROLLED WITH YOUR NAUSEA MEDICATION  *UNUSUAL SHORTNESS OF BREATH  *UNUSUAL BRUISING OR BLEEDING  TENDERNESS IN MOUTH AND THROAT WITH OR WITHOUT PRESENCE OF ULCERS  *URINARY PROBLEMS  *BOWEL PROBLEMS  UNUSUAL RASH Items with * indicate a potential emergency and should be followed up as soon as possible.  Feel free to call the clinic should you have any questions or concerns. The clinic phone number is [phone].  Please show the CHEMO ALERT CARD at check-in to the Emergency Department and triage nurse.   

## 2018-10-27 NOTE — Progress Notes (Signed)
Labs reveiwed at office visit today. Per MD, no irrinotecan today. Proceed with treatment today.   Treatment given per orders. Patient tolerated it well without problems. Vitals stable and discharged home from clinic ambulatory. Follow up as scheduled.

## 2018-10-28 LAB — CEA: CEA: 7.5 ng/mL — ABNORMAL HIGH (ref 0.0–4.7)

## 2018-10-29 ENCOUNTER — Other Ambulatory Visit: Payer: Self-pay

## 2018-10-29 ENCOUNTER — Inpatient Hospital Stay (HOSPITAL_COMMUNITY): Payer: Medicare Other

## 2018-10-29 VITALS — BP 141/79 | HR 55 | Temp 96.6°F | Resp 18

## 2018-10-29 DIAGNOSIS — C184 Malignant neoplasm of transverse colon: Secondary | ICD-10-CM

## 2018-10-29 DIAGNOSIS — Z95828 Presence of other vascular implants and grafts: Secondary | ICD-10-CM

## 2018-10-29 DIAGNOSIS — Z5111 Encounter for antineoplastic chemotherapy: Secondary | ICD-10-CM | POA: Diagnosis not present

## 2018-10-29 MED ORDER — SODIUM CHLORIDE 0.9% FLUSH
10.0000 mL | INTRAVENOUS | Status: DC | PRN
  Administered 2018-10-29: 10 mL
  Filled 2018-10-29: qty 10

## 2018-10-29 MED ORDER — HEPARIN SOD (PORK) LOCK FLUSH 100 UNIT/ML IV SOLN
500.0000 [IU] | Freq: Once | INTRAVENOUS | Status: AC | PRN
  Administered 2018-10-29: 500 [IU]

## 2018-10-29 NOTE — Progress Notes (Signed)
Pt presents for pump d/c today. Pt has no complaints of any issues or changes. VSS. MAR reviewed. No complaints at this time. Discharged from clinic ambulatory. F/U with [facility] as scheduled.

## 2018-10-29 NOTE — Patient Instructions (Signed)
[facility] at [facility]  Discharge Instructions:   _______________________________________________________________  Thank you for choosing [facility] at [facility] to provide your oncology and hematology care.  To afford each patient quality time with our providers, please arrive at least 15 minutes before your scheduled appointment.  You need to re-schedule your appointment if you arrive 10 or more minutes late.  We strive to give you quality time with our providers, and arriving late affects you and other patients whose appointments are after yours.  Also, if you no show three or more times for appointments you may be dismissed from the clinic.  Again, thank you for choosing [facility] at [facility]. Our hope is that these requests will allow you access to exceptional care and in a timely manner. _______________________________________________________________  If you have questions after your visit, please contact our office at [phone] between the hours of 8:30 a.m. and 5:00 p.m. Voicemails left after 4:30 p.m. will not be returned until the following business day. _______________________________________________________________  For prescription refill requests, have your pharmacy contact our office. _______________________________________________________________  Recommendations made by the consultant and any test results will be sent to your referring physician. _______________________________________________________________ 

## 2018-11-08 ENCOUNTER — Ambulatory Visit (HOSPITAL_COMMUNITY)
Admission: RE | Admit: 2018-11-08 | Discharge: 2018-11-08 | Disposition: A | Discharge status: Home / Self Care | Payer: Medicare Other | Source: Ambulatory Visit | Attending: Hematology | Admitting: Hematology

## 2018-11-08 ENCOUNTER — Other Ambulatory Visit: Payer: Self-pay

## 2018-11-08 DIAGNOSIS — C184 Malignant neoplasm of transverse colon: Secondary | ICD-10-CM | POA: Insufficient documentation

## 2018-11-08 MED ORDER — IOHEXOL 300 MG/ML  SOLN
100.0000 mL | Freq: Once | INTRAMUSCULAR | Status: AC | PRN
  Administered 2018-11-08: 100 mL via INTRAVENOUS

## 2018-11-10 ENCOUNTER — Other Ambulatory Visit: Payer: Self-pay

## 2018-11-10 ENCOUNTER — Inpatient Hospital Stay (HOSPITAL_COMMUNITY): Payer: Medicare Other

## 2018-11-10 ENCOUNTER — Encounter (HOSPITAL_COMMUNITY): Payer: Self-pay

## 2018-11-10 ENCOUNTER — Inpatient Hospital Stay (HOSPITAL_BASED_OUTPATIENT_CLINIC_OR_DEPARTMENT_OTHER): Payer: Medicare Other | Admitting: Hematology

## 2018-11-10 VITALS — BP 144/61 | HR 50 | Temp 96.9°F | Resp 18

## 2018-11-10 DIAGNOSIS — Z95828 Presence of other vascular implants and grafts: Secondary | ICD-10-CM

## 2018-11-10 DIAGNOSIS — Z5111 Encounter for antineoplastic chemotherapy: Secondary | ICD-10-CM | POA: Diagnosis not present

## 2018-11-10 DIAGNOSIS — C184 Malignant neoplasm of transverse colon: Secondary | ICD-10-CM | POA: Diagnosis not present

## 2018-11-10 LAB — CBC WITH DIFFERENTIAL/PLATELET
Abs Immature Granulocytes: 0.01 10*3/uL (ref 0.00–0.07)
Basophils Absolute: 0 10*3/uL (ref 0.0–0.1)
Basophils Relative: 0 %
Eosinophils Absolute: 0.2 10*3/uL (ref 0.0–0.5)
Eosinophils Relative: 2 %
HCT: 35.3 % — ABNORMAL LOW (ref 39.0–52.0)
Hemoglobin: 11.1 g/dL — ABNORMAL LOW (ref 13.0–17.0)
Immature Granulocytes: 0 %
Lymphocytes Relative: 33 %
Lymphs Abs: 2.7 10*3/uL (ref 0.7–4.0)
MCH: 33.9 pg (ref 26.0–34.0)
MCHC: 31.4 g/dL (ref 30.0–36.0)
MCV: 108 fL — ABNORMAL HIGH (ref 80.0–100.0)
Monocytes Absolute: 1.2 10*3/uL — ABNORMAL HIGH (ref 0.1–1.0)
Monocytes Relative: 15 %
Neutro Abs: 3.9 10*3/uL (ref 1.7–7.7)
Neutrophils Relative %: 50 %
Platelets: 161 10*3/uL (ref 150–400)
RBC: 3.27 MIL/uL — ABNORMAL LOW (ref 4.22–5.81)
RDW: 15.7 % — ABNORMAL HIGH (ref 11.5–15.5)
WBC: 8 10*3/uL (ref 4.0–10.5)
nRBC: 0 % (ref 0.0–0.2)

## 2018-11-10 LAB — COMPREHENSIVE METABOLIC PANEL
ALT: 23 U/L (ref 0–44)
AST: 28 U/L (ref 15–41)
Albumin: 3.5 g/dL (ref 3.5–5.0)
Alkaline Phosphatase: 45 U/L (ref 38–126)
Anion gap: 10 (ref 5–15)
BUN: 13 mg/dL (ref 8–23)
CO2: 24 mmol/L (ref 22–32)
Calcium: 8.7 mg/dL — ABNORMAL LOW (ref 8.9–10.3)
Chloride: 106 mmol/L (ref 98–111)
Creatinine, Ser: 0.86 mg/dL (ref 0.61–1.24)
GFR calc Af Amer: >60 mL/min (ref >60)
GFR calc non Af Amer: >60 mL/min (ref >60)
Glucose, Bld: 175 mg/dL — ABNORMAL HIGH (ref 70–99)
Potassium: 3.8 mmol/L (ref 3.5–5.1)
Sodium: 140 mmol/L (ref 135–145)
Total Bilirubin: 1.1 mg/dL (ref 0.3–1.2)
Total Protein: 6.3 g/dL — ABNORMAL LOW (ref 6.5–8.1)

## 2018-11-10 LAB — URINALYSIS, DIPSTICK ONLY
Bilirubin Urine: NEGATIVE
Glucose, UA: NEGATIVE mg/dL
Hgb urine dipstick: NEGATIVE
Ketones, ur: NEGATIVE mg/dL
Leukocytes,Ua: NEGATIVE
Nitrite: NEGATIVE
Protein, ur: NEGATIVE mg/dL
Specific Gravity, Urine: 1.024 (ref 1.005–1.030)
pH: 5 (ref 5.0–8.0)

## 2018-11-10 MED ORDER — PALONOSETRON HCL INJECTION 0.25 MG/5ML
0.2500 mg | Freq: Once | INTRAVENOUS | Status: AC
  Administered 2018-11-10: 0.25 mg via INTRAVENOUS

## 2018-11-10 MED ORDER — SODIUM CHLORIDE 0.9 % IV SOLN
10.0000 mg | Freq: Once | INTRAVENOUS | Status: AC
  Administered 2018-11-10: 10 mg via INTRAVENOUS
  Filled 2018-11-10: qty 10

## 2018-11-10 MED ORDER — SODIUM CHLORIDE 0.9 % IV SOLN
400.0000 mg | Freq: Once | INTRAVENOUS | Status: AC
  Administered 2018-11-10: 400 mg via INTRAVENOUS
  Filled 2018-11-10: qty 16

## 2018-11-10 MED ORDER — PALONOSETRON HCL INJECTION 0.25 MG/5ML
INTRAVENOUS | Status: AC
  Filled 2018-11-10: qty 5

## 2018-11-10 MED ORDER — SODIUM CHLORIDE 0.9% FLUSH
10.0000 mL | INTRAVENOUS | Status: DC | PRN
  Administered 2018-11-10: 10 mL
  Filled 2018-11-10: qty 10

## 2018-11-10 MED ORDER — SODIUM CHLORIDE 0.9 % IV SOLN
700.0000 mg | Freq: Once | INTRAVENOUS | Status: AC
  Administered 2018-11-10: 700 mg via INTRAVENOUS
  Filled 2018-11-10: qty 35

## 2018-11-10 MED ORDER — SODIUM CHLORIDE 0.9 % IV SOLN
Freq: Once | INTRAVENOUS | Status: AC
  Administered 2018-11-10: 10:00:00 via INTRAVENOUS

## 2018-11-10 MED ORDER — SODIUM CHLORIDE 0.9 % IV SOLN
1920.0000 mg/m2 | INTRAVENOUS | Status: DC
  Administered 2018-11-10: 3950 mg via INTRAVENOUS
  Filled 2018-11-10: qty 79

## 2018-11-10 MED ORDER — FLUOROURACIL CHEMO INJECTION 2.5 GM/50ML
320.0000 mg/m2 | Freq: Once | INTRAVENOUS | Status: AC
  Administered 2018-11-10: 650 mg via INTRAVENOUS
  Filled 2018-11-10: qty 13

## 2018-11-10 NOTE — Patient Instructions (Signed)
[facility] at [facility] Discharge Instructions  Labs drawn from portacath today   Thank you for choosing [facility] at [facility] to provide your oncology and hematology care.  To afford each patient quality time with our provider, please arrive at least 15 minutes before your scheduled appointment time.   If you have a lab appointment with the Cancer Center please come in thru the  Main Entrance and check in at the main information desk  You need to re-schedule your appointment should you arrive 10 or more minutes late.  We strive to give you quality time with our providers, and arriving late affects you and other patients whose appointments are after yours.  Also, if you no show three or more times for appointments you may be dismissed from the clinic at the providers discretion.     Again, thank you for choosing [facility].  Our hope is that these requests will decrease the amount of time that you wait before being seen by our physicians.       _____________________________________________________________  Should you have questions after your visit to [facility], please contact our office at [phone] between the hours of 8:00 a.m. and 4:30 p.m.  Voicemails left after 4:00 p.m. will not be returned until the following business day.  For prescription refill requests, have your pharmacy contact our office and allow 72 hours.    Cancer Center Support Programs:   > Cancer Support Group  2nd Tuesday of the month 1pm-2pm, Journey Room   

## 2018-11-10 NOTE — Progress Notes (Signed)
Pt presents today for treatment and f/u appointment with Dr. Delton Coombes. VS within parameters for treatment. MAR reviewed and labs pending at this time. Pt has no complaints of pain today. Pt states, " I feel so much better since I don't take the irinotecan anymore." Pt states , " I have been up mowing and doing all kinds of work and feel so much better. "   Message received from [place]. Proceed with treatment. Labs within parameters for treatment today. Labs reviewed by MD.   Treatment given today per MD orders. Tolerated infusion without adverse affects. Vital signs stable. 5FU pump infusing per protocol. RUN noted on screen. No complaints at this time. Discharged from clinic ambulatory. F/U with [facility] as scheduled.

## 2018-11-10 NOTE — Patient Instructions (Signed)
[facility] Discharge Instructions for Patients Receiving Chemotherapy  Today you received the following chemotherapy agents   To help prevent nausea and vomiting after your treatment, we encourage you to take your nausea medication   If you develop nausea and vomiting that is not controlled by your nausea medication, call the clinic.   BELOW ARE SYMPTOMS THAT SHOULD BE REPORTED IMMEDIATELY:  *FEVER GREATER THAN 100.5 F  *CHILLS WITH OR WITHOUT FEVER  NAUSEA AND VOMITING THAT IS NOT CONTROLLED WITH YOUR NAUSEA MEDICATION  *UNUSUAL SHORTNESS OF BREATH  *UNUSUAL BRUISING OR BLEEDING  TENDERNESS IN MOUTH AND THROAT WITH OR WITHOUT PRESENCE OF ULCERS  *URINARY PROBLEMS  *BOWEL PROBLEMS  UNUSUAL RASH Items with * indicate a potential emergency and should be followed up as soon as possible.  Feel free to call the clinic should you have any questions or concerns. The clinic phone number is [phone].  Please show the CHEMO ALERT CARD at check-in to the Emergency Department and triage nurse.   

## 2018-11-10 NOTE — Patient Instructions (Signed)
[facility] Discharge Instructions  You were seen today by Dr. Katragadda. He went over your recent lab results. He will see you back in 2 weeks for labs and follow up.   Thank you for choosing  Can[facility] Center at [facility] to provide your oncology and hematology care.  To afford each patient quality time with our provider, please arrive at least 15 minutes before your scheduled appointment time.   If you have a lab appointment with the Cancer Center please come in thru the  Main Entrance and check in at the main information desk  You need to re-schedule your appointment should you arrive 10 or more minutes late.  We strive to give you quality time with our providers, and arriving late affects you and other patients whose appointments are after yours.  Also, if you no show three or more times for appointments you may be dismissed from the clinic at the providers discretion.     Again, thank you for choosing [facility].  Our hope is that these requests will decrease the amount of time that you wait before being seen by our physicians.       _____________________________________________________________  Should you have questions after your visit to  Cancer Center, please contact our office at [phone] between the hours of 8:00 a.m. and 4:30 p.m.  Voicemails left after 4:00 p.m. will not be returned until the following business day.  For prescription refill requests, have your pharmacy contact our office and allow 72 hours.    Cancer Center Support Programs:   > Cancer Support Group  2nd Tuesday of the month 1pm-2pm, Journey Room    

## 2018-11-11 ENCOUNTER — Inpatient Hospital Stay (HOSPITAL_COMMUNITY): Payer: Medicare Other

## 2018-11-11 ENCOUNTER — Encounter (HOSPITAL_COMMUNITY): Payer: Self-pay | Admitting: Hematology

## 2018-11-11 ENCOUNTER — Encounter (HOSPITAL_COMMUNITY): Payer: Self-pay | Admitting: *Deleted

## 2018-11-11 ENCOUNTER — Inpatient Hospital Stay (HOSPITAL_BASED_OUTPATIENT_CLINIC_OR_DEPARTMENT_OTHER): Payer: Medicare Other | Admitting: Hematology

## 2018-11-11 VITALS — BP 147/74 | HR 77 | Temp 97.6°F | Resp 18

## 2018-11-11 DIAGNOSIS — Z5111 Encounter for antineoplastic chemotherapy: Secondary | ICD-10-CM | POA: Diagnosis not present

## 2018-11-11 DIAGNOSIS — R112 Nausea with vomiting, unspecified: Secondary | ICD-10-CM

## 2018-11-11 DIAGNOSIS — C184 Malignant neoplasm of transverse colon: Secondary | ICD-10-CM | POA: Diagnosis not present

## 2018-11-11 DIAGNOSIS — E860 Dehydration: Secondary | ICD-10-CM | POA: Insufficient documentation

## 2018-11-11 DIAGNOSIS — D509 Iron deficiency anemia, unspecified: Secondary | ICD-10-CM

## 2018-11-11 LAB — CBC WITH DIFFERENTIAL/PLATELET
Abs Immature Granulocytes: 0.03 10*3/uL (ref 0.00–0.07)
Basophils Absolute: 0 10*3/uL (ref 0.0–0.1)
Basophils Relative: 0 %
Eosinophils Absolute: 0 10*3/uL (ref 0.0–0.5)
Eosinophils Relative: 0 %
HCT: 37 % — ABNORMAL LOW (ref 39.0–52.0)
Hemoglobin: 11.6 g/dL — ABNORMAL LOW (ref 13.0–17.0)
Immature Granulocytes: 0 %
Lymphocytes Relative: 19 %
Lymphs Abs: 2.5 10*3/uL (ref 0.7–4.0)
MCH: 33.9 pg (ref 26.0–34.0)
MCHC: 31.4 g/dL (ref 30.0–36.0)
MCV: 108.2 fL — ABNORMAL HIGH (ref 80.0–100.0)
Monocytes Absolute: 1.4 10*3/uL — ABNORMAL HIGH (ref 0.1–1.0)
Monocytes Relative: 10 %
Neutro Abs: 9.6 10*3/uL — ABNORMAL HIGH (ref 1.7–7.7)
Neutrophils Relative %: 71 %
Platelets: 178 10*3/uL (ref 150–400)
RBC: 3.42 MIL/uL — ABNORMAL LOW (ref 4.22–5.81)
RDW: 15.9 % — ABNORMAL HIGH (ref 11.5–15.5)
WBC: 13.6 10*3/uL — ABNORMAL HIGH (ref 4.0–10.5)
nRBC: 0 % (ref 0.0–0.2)

## 2018-11-11 LAB — COMPREHENSIVE METABOLIC PANEL
ALT: 28 U/L (ref 0–44)
AST: 31 U/L (ref 15–41)
Albumin: 4.1 g/dL (ref 3.5–5.0)
Alkaline Phosphatase: 46 U/L (ref 38–126)
Anion gap: 13 (ref 5–15)
BUN: 23 mg/dL (ref 8–23)
CO2: 21 mmol/L — ABNORMAL LOW (ref 22–32)
Calcium: 9.4 mg/dL (ref 8.9–10.3)
Chloride: 107 mmol/L (ref 98–111)
Creatinine, Ser: 0.92 mg/dL (ref 0.61–1.24)
GFR calc Af Amer: >60 mL/min (ref >60)
GFR calc non Af Amer: >60 mL/min (ref >60)
Glucose, Bld: 209 mg/dL — ABNORMAL HIGH (ref 70–99)
Potassium: 3.8 mmol/L (ref 3.5–5.1)
Sodium: 141 mmol/L (ref 135–145)
Total Bilirubin: 1.3 mg/dL — ABNORMAL HIGH (ref 0.3–1.2)
Total Protein: 7.2 g/dL (ref 6.5–8.1)

## 2018-11-11 LAB — MAGNESIUM: Magnesium: 1.9 mg/dL (ref 1.7–2.4)

## 2018-11-11 MED ORDER — PROMETHAZINE HCL 25 MG/ML IJ SOLN: 12.5000 mg | Freq: Once | INTRAMUSCULAR | Status: DC

## 2018-11-11 MED ORDER — SODIUM CHLORIDE 0.9 % IV SOLN: 8.0000 mg | Freq: Once | INTRAVENOUS | Status: DC

## 2018-11-11 MED ORDER — ONDANSETRON 8 MG PO TBDP: 8.0000 mg | ORAL_TABLET | Freq: Three times a day (TID) | ORAL | 0 refills | Status: DC | PRN

## 2018-11-11 MED ORDER — SODIUM CHLORIDE 0.9 % IV SOLN
Freq: Once | INTRAVENOUS | Status: AC
  Administered 2018-11-11: 13:00:00 via INTRAVENOUS
  Filled 2018-11-11: qty 10

## 2018-11-11 MED ORDER — PROMETHAZINE HCL 25 MG/ML IJ SOLN
INTRAMUSCULAR | Status: AC
  Filled 2018-11-11: qty 1

## 2018-11-11 MED ORDER — SODIUM CHLORIDE 0.9% FLUSH
10.0000 mL | Freq: Once | INTRAVENOUS | Status: AC
  Administered 2018-11-11: 10 mL

## 2018-11-11 MED ORDER — PROMETHAZINE HCL 25 MG/ML IJ SOLN
12.5000 mg | Freq: Once | INTRAMUSCULAR | Status: AC
  Administered 2018-11-11: 12.5 mg via INTRAVENOUS

## 2018-11-11 NOTE — Progress Notes (Signed)
[place], [place] [address]   CLINIC:  Medical Oncology/Hematology  PCP:  Tobe Sos, MD [address] [place] [address] [phone]   REASON FOR VISIT:  Follow-up for Colon Cancer   CURRENT THERAPY: 5-FU and Avastin   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[place])  07/11/2016 Initial Diagnosis   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[place])   07/27/2017 -  Chemotherapy   The patient had palonosetron (ALOXI) injection 0.25 mg, 0.25 mg, Intravenous,  Once, 34 of 38 cycles Administration: 0.25 mg (07/27/2017), 0.25 mg (08/10/2017), 0.25 mg (08/24/2017), 0.25 mg (09/07/2017), 0.25 mg (09/21/2017), 0.25 mg (10/05/2017), 0.25 mg (10/19/2017), 0.25 mg (11/02/2017), 0.25 mg (11/16/2017), 0.25 mg (11/30/2017), 0.25 mg (12/14/2017), 0.25 mg (12/28/2017), 0.25 mg (01/12/2018), 0.25 mg (01/26/2018), 0.25 mg (02/09/2018), 0.25 mg (02/24/2018), 0.25 mg (03/10/2018), 0.25 mg (03/29/2018), 0.25 mg (04/12/2018), 0.25 mg (04/26/2018), 0.25 mg (05/10/2018), 0.25 mg (05/24/2018), 0.25 mg (06/07/2018), 0.25 mg (06/21/2018), 0.25 mg (07/05/2018), 0.25 mg (07/19/2018), 0.25 mg (08/03/2018), 0.25 mg (08/18/2018), 0.25 mg (09/01/2018), 0.25 mg (09/15/2018), 0.25 mg (09/29/2018), 0.25 mg (10/13/2018), 0.25 mg (10/27/2018), 0.25 mg (11/10/2018) pegfilgrastim-cbqv (UDENYCA) injection 6 mg, 6 mg, Subcutaneous, Once, 7 of 7 cycles Administration: 6 mg (10/07/2017), 6 mg (10/21/2017), 6 mg (11/04/2017), 6 mg (11/18/2017), 6 mg (12/02/2017), 6 mg (12/16/2017), 6 mg (12/30/2017) bevacizumab-bvzr (ZIRABEV) chemo injection 400 mg, 5 mg/kg = 400 mg (original dose ), Intravenous,  Once, 0 of 4 cycles Dose modification: 5 mg/kg (Cycle 35) bevacizumab (AVASTIN) 450 mg in sodium chloride 0.9 % 100 mL chemo infusion, 5 mg/kg = 450 mg, Intravenous,  Once, 34 of 34 cycles Dose modification: 4 mg/kg (80 % of original dose 5 mg/kg, Cycle 5, Reason: Provider Judgment), 5 mg/kg (original  dose 5 mg/kg, Cycle 16, Reason: Other (see comments)) Administration: 450 mg (07/27/2017), 450 mg (08/10/2017), 450 mg (08/24/2017), 450 mg (09/07/2017), 450 mg (09/21/2017), 450 mg (10/05/2017), 450 mg (10/19/2017), 450 mg (11/02/2017), 450 mg (11/16/2017), 450 mg (11/30/2017), 450 mg (12/14/2017), 450 mg (12/28/2017), 400 mg (01/12/2018), 400 mg (01/26/2018), 400 mg (02/09/2018), 400 mg (02/24/2018), 400 mg (03/10/2018), 400 mg (03/29/2018), 400 mg (04/12/2018), 400 mg (04/26/2018), 400 mg (05/10/2018), 400 mg (05/24/2018), 400 mg (06/07/2018), 400 mg (06/21/2018), 400 mg (07/05/2018), 400 mg (07/19/2018), 400 mg (08/03/2018), 400 mg (08/18/2018), 400 mg (09/01/2018), 400 mg (09/15/2018), 400 mg (09/29/2018), 400 mg (10/13/2018), 400 mg (10/27/2018), 400 mg (11/10/2018) irinotecan (CAMPTOSAR) 400 mg in dextrose 5 % 500 mL chemo infusion, 180 mg/m2 = 400 mg, Intravenous,  Once, 32 of 32 cycles Dose modification: 144 mg/m2 (80 % of original dose 180 mg/m2, Cycle 5, Reason: Provider Judgment), 135 mg/m2 (75 % of original dose 180 mg/m2, Cycle 30, Reason: Other (see comments), Comment: diarrhea) Administration: 400 mg (07/27/2017), 400 mg (08/10/2017), 400 mg (08/24/2017), 400 mg (09/07/2017), 320 mg (09/21/2017), 320 mg (10/05/2017), 320 mg (10/19/2017), 320 mg (11/02/2017), 320 mg (11/16/2017), 320 mg (11/30/2017), 320 mg (12/14/2017), 320 mg (12/28/2017), 320 mg (01/12/2018), 320 mg (01/26/2018), 320 mg (02/09/2018), 300 mg (02/24/2018), 300 mg (03/10/2018), 300 mg (03/29/2018), 300 mg (04/12/2018), 300 mg (04/26/2018), 300 mg (05/10/2018), 300 mg (05/24/2018), 300 mg (06/07/2018), 300 mg (06/21/2018), 300 mg (07/05/2018), 300 mg (07/19/2018), 300 mg (08/03/2018), 300 mg (08/18/2018), 300 mg (09/01/2018), 280 mg (09/15/2018), 280 mg (09/29/2018), 280 mg (10/13/2018) leucovorin 800 mg in dextrose 5 % 250 mL infusion, 872 mg, Intravenous,  Once, 34 of 38 cycles Dose modification: 320 mg/m2 (  80 % of original dose 400 mg/m2, Cycle 5, Reason: Provider Judgment) Administration: 800 mg  (07/27/2017), 800 mg (08/10/2017), 800 mg (08/24/2017), 800 mg (09/07/2017), 700 mg (09/21/2017), 700 mg (10/05/2017), 700 mg (10/19/2017), 700 mg (11/02/2017), 700 mg (11/16/2017), 700 mg (11/30/2017), 700 mg (12/14/2017), 700 mg (12/28/2017), 700 mg (01/12/2018), 700 mg (01/26/2018), 700 mg (02/09/2018), 700 mg (02/24/2018), 700 mg (03/10/2018), 700 mg (03/29/2018), 700 mg (04/12/2018), 700 mg (04/26/2018), 700 mg (05/10/2018), 700 mg (05/24/2018), 700 mg (06/07/2018), 700 mg (06/21/2018), 700 mg (07/05/2018), 700 mg (07/19/2018), 700 mg (08/03/2018), 700 mg (08/18/2018), 700 mg (09/01/2018), 700 mg (09/15/2018), 700 mg (09/29/2018), 700 mg (10/13/2018), 700 mg (10/27/2018), 700 mg (11/10/2018) fluorouracil (ADRUCIL) chemo injection 850 mg, 400 mg/m2 = 850 mg, Intravenous,  Once, 34 of 38 cycles Dose modification: 320 mg/m2 (80 % of original dose 400 mg/m2, Cycle 5, Reason: Provider Judgment) Administration: 850 mg (07/27/2017), 850 mg (08/10/2017), 850 mg (08/24/2017), 850 mg (09/07/2017), 700 mg (09/21/2017), 700 mg (10/05/2017), 700 mg (10/19/2017), 700 mg (11/02/2017), 700 mg (11/16/2017), 700 mg (11/30/2017), 700 mg (12/14/2017), 700 mg (12/28/2017), 700 mg (01/12/2018), 700 mg (01/26/2018), 700 mg (02/09/2018), 650 mg (02/24/2018), 650 mg (03/10/2018), 650 mg (03/29/2018), 650 mg (04/12/2018), 650 mg (04/26/2018), 650 mg (05/10/2018), 650 mg (05/24/2018), 650 mg (06/07/2018), 650 mg (06/21/2018), 650 mg (07/05/2018), 650 mg (07/19/2018), 650 mg (08/03/2018), 650 mg (08/18/2018), 650 mg (09/01/2018), 650 mg (09/15/2018), 650 mg (09/29/2018), 650 mg (10/13/2018), 650 mg (10/27/2018), 650 mg (11/10/2018) fluorouracil (ADRUCIL) 5,250 mg in sodium chloride 0.9 % 145 mL chemo infusion, 2,400 mg/m2 = 5,250 mg, Intravenous, 1 Day/Dose, 34 of 38 cycles Dose modification: 1,920 mg/m2 (80 % of original dose 2,400 mg/m2, Cycle 5, Reason: Provider Judgment) Administration: 5,250 mg (07/27/2017), 5,250 mg (08/10/2017), 5,250 mg (08/24/2017), 5,250 mg (09/07/2017), 4,200 mg (09/21/2017), 4,200 mg  (10/05/2017), 4,200 mg (10/19/2017), 4,200 mg (11/02/2017), 4,200 mg (11/16/2017), 4,200 mg (11/30/2017), 4,200 mg (12/14/2017), 4,200 mg (12/28/2017), 4,200 mg (01/12/2018), 4,200 mg (01/26/2018), 4,200 mg (02/09/2018), 3,950 mg (02/24/2018), 3,950 mg (03/10/2018), 3,950 mg (03/29/2018), 3,950 mg (04/12/2018), 3,950 mg (04/26/2018), 3,950 mg (05/10/2018), 3,950 mg (05/24/2018), 3,950 mg (06/07/2018), 3,950 mg (06/21/2018), 3,950 mg (07/05/2018), 3,950 mg (07/19/2018), 3,950 mg (08/03/2018), 3,950 mg (08/18/2018), 3,950 mg (09/01/2018), 3,950 mg (09/15/2018), 3,950 mg (09/29/2018), 3,950 mg (10/13/2018), 3,950 mg (10/27/2018), 3,950 mg (11/10/2018)  for chemotherapy treatment.       CANCER STAGING: Cancer Staging Malignant neoplasm of transverse colon [facility]) Staging form: Colon and Rectum, AJCC 8th Edition - Clinical: cT3, cN1a, cM1 - Signed by Twana First, MD on 03/09/2017 - Pathologic: No stage assigned - Unsigned    INTERVAL HISTORY:  Mr. Daversa 85 y.o. male presents today for sick visit.  Patient states he developed nausea and vomiting after breakfast this morning.  The patient usually does not have any nausea or vomiting after chemotherapy.  Patient states that he ate ham and eggs this morning.  He went outside to do a couple things and became very fatigued and started dry heaving and then had vomiting.  He states he did try to take a nausea pill but he vomited.  Patient overall states he feels very weak.  His granddaughter accompanies him today.   REVIEW OF SYSTEMS:  Review of Systems  Constitutional: Positive for fatigue.  HENT:  Negative.   Eyes: Negative.   Respiratory: Negative.   Cardiovascular: Negative.   Gastrointestinal: Positive for nausea and vomiting.  Endocrine: Negative.   Genitourinary: Negative.    Musculoskeletal: Negative.   Skin:  Negative.   Neurological: Negative.   Hematological: Negative.   Psychiatric/Behavioral: Negative.      PAST MEDICAL/SURGICAL HISTORY:  Past Medical History:   Diagnosis Date  . Chronic pulmonary embolism ([place]) 54/0086   compliction of the valve replacement surgery  . COPD (chronic obstructive pulmonary disease) ([place])   . Diabetes ([place]) 04/22/2016  . H/O aortic valve replacement 10/2008  . Hypercholesteremia   . Hypertension   . Pulmonary emboli ([place]) 10/2008  . Sleep apnea    Past Surgical History:  Procedure Laterality Date  . AORTIC VALVE REPLACEMENT     2010  . CARDIAC CATHETERIZATION  2010  . COLONOSCOPY N/A 06/05/2016   Procedure: COLONOSCOPY;  Surgeon: Rogene Houston, MD;  Location: [facility] ENDO SUITE;  Service: Endoscopy;  Laterality: N/A;  . PARTIAL COLECTOMY N/A 07/11/2016   Procedure: PARTIAL COLECTOMY;  Surgeon: Aviva Signs, MD;  Location: [facility] ORS;  Service: General;  Laterality: N/A;  . PORTACATH PLACEMENT N/A 08/13/2016   Procedure: INSERTION PORT-A-CATH LEFT SUBCLAVIAN;  Surgeon: Aviva Signs, MD;  Location: [facility] ORS;  Service: General;  Laterality: N/A;  . REPLACEMENT TOTAL KNEE     1996  . spenectomy     from trauma     SOCIAL HISTORY:  Social History   Socioeconomic History  . Marital status: Widowed    Spouse name: Not on file  . Number of children: Not on file  . Years of education: Not on file  . Highest education level: Not on file  Occupational History  . Not on file  Social Needs  . Financial resource strain: Not on file  . Food insecurity    Worry: Not on file    Inability: Not on file  . Transportation needs    Medical: Not on file    Non-medical: Not on file  Tobacco Use  . Smoking status: Former Smoker    Years: 15.00    Types: Cigarettes    Quit date: 1964    Years since quitting: 56.6  . Smokeless tobacco: Never Used  Substance and Sexual Activity  . Alcohol use: No  . Drug use: No  . Sexual activity: Yes  Lifestyle  . Physical activity    Days per week: Not on file    Minutes per session: Not on file  . Stress: Not on file  Relationships  . Social Herbalist on phone: Not on  file    Gets together: Not on file    Attends religious service: Not on file    Active member of club or organization: Not on file    Attends meetings of clubs or organizations: Not on file    Relationship status: Not on file  . Intimate partner violence    Fear of current or ex partner: Not on file    Emotionally abused: Not on file    Physically abused: Not on file    Forced sexual activity: Not on file  Other Topics Concern  . Not on file  Social History Narrative  . Not on file    FAMILY HISTORY:  Family History  Problem Relation Age of Onset  . Colon cancer Neg Hx     CURRENT MEDICATIONS:  Outpatient Encounter Medications as of 11/11/2018  Medication Sig Note  . atorvastatin (LIPITOR) 10 MG tablet Take 10 mg by mouth at bedtime.    . Cholecalciferol (VITAMIN D3) 5000 units CAPS Take 5,000 Units by mouth daily.    . diphenoxylate-atropine (LOMOTIL) 2.5-0.025  MG tablet Take 1 tablet by mouth 4 (four) times daily as needed for diarrhea or loose stools.   . gabapentin (NEURONTIN) 600 MG tablet Take 1 tablet (600 mg total) by mouth 2 (two) times daily.   Marland Kitchen lidocaine-prilocaine (EMLA) cream Apply 1 application topically as needed.   Marland Kitchen lisinopril (PRINIVIL,ZESTRIL) 10 MG tablet Take 10 mg by mouth daily.   . metoprolol (LOPRESSOR) 50 MG tablet Take 50 mg by mouth 2 (two) times daily.   . Multiple Vitamins-Minerals (CENTRUM SILVER 50+MEN PO) Take 1 tablet by mouth daily.   Marland Kitchen NOVOLIN R FLEXPEN RELION 100 UNIT/ML SOPN INJECT 5 UNITS ONCE DAILY FOR GLUCOSE 150   . ondansetron (ZOFRAN ODT) 8 MG disintegrating tablet Take 1 tablet (8 mg total) by mouth every 8 (eight) hours as needed for nausea or vomiting.   Marland Kitchen RELION GLUCOSE TEST STRIPS test strip USE 1 STRIP TO CHECK GLUCOSE ONCE DAILY   . vitamin B-12 (CYANOCOBALAMIN) 1000 MCG tablet Take 1,000 mcg by mouth daily.    . [DISCONTINUED] prochlorperazine (COMPAZINE) 10 MG tablet Take 1 tablet (10 mg total) by mouth every 6 (six) hours as  needed (Nausea or vomiting). 08/05/2016: New Med   Facility-Administered Encounter Medications as of 11/11/2018  Medication  . [COMPLETED] promethazine (PHENERGAN) injection 12.5 mg  . [COMPLETED] sodium chloride 0.9 % 1,000 mL with potassium chloride 20 mEq, magnesium sulfate 2 g infusion  . sodium chloride flush (NS) 0.9 % injection 10 mL  . [COMPLETED] sodium chloride flush (NS) 0.9 % injection 10 mL  . [COMPLETED] sodium chloride flush (NS) 0.9 % injection 10 mL    ALLERGIES:  Allergies  Allergen Reactions  . Amoxicillin Hives    Has patient had a PCN reaction causing immediate rash, facial/tongue/throat swelling, SOB or lightheadedness with hypotension: No Has patient had a PCN reaction causing severe rash involving mucus membranes or skin necrosis: Yes Has patient had a PCN reaction that required hospitalization No Has patient had a PCN reaction occurring within the last 10 years: No If all of the above answers are "NO", then may proceed with Cephalosporin use.   . Tamsulosin Hcl     Cant sleep, confusion      PHYSICAL EXAM:  ECOG Performance status: 3  There were no vitals filed for this visit. There were no vitals filed for this visit.  Physical Exam Vitals signs and nursing note reviewed. Exam conducted with a chaperone present.  Constitutional:      Appearance: Normal appearance. He is obese.  HENT:     Head: Normocephalic.     Right Ear: External ear normal.     Left Ear: External ear normal.     Nose: Nose normal.     Mouth/Throat:     Mouth: Mucous membranes are moist.     Pharynx: Oropharynx is clear.  Eyes:     Extraocular Movements: Extraocular movements intact.     Conjunctiva/sclera: Conjunctivae normal.  Neck:     Musculoskeletal: Normal range of motion.  Cardiovascular:     Rate and Rhythm: Regular rhythm. Bradycardia present.     Pulses: Normal pulses.     Heart sounds: Normal heart sounds.  Pulmonary:     Effort: Pulmonary effort is normal.      Breath sounds: Normal breath sounds. No stridor.  Abdominal:     General: Abdomen is flat. Bowel sounds are normal.     Palpations: Abdomen is soft.  Musculoskeletal: Normal range of motion.  Skin:  General: Skin is warm and dry.  Neurological:     General: No focal deficit present.     Mental Status: He is alert and oriented to person, place, and time. Mental status is at baseline.  Psychiatric:        Mood and Affect: Mood normal.        Behavior: Behavior normal.        Thought Content: Thought content normal.        Judgment: Judgment normal.      LABORATORY DATA:  I have reviewed the labs as listed.  CBC    Component Value Date/Time   WBC 13.6 (H) 11/11/2018 1215   RBC 3.42 (L) 11/11/2018 1215   HGB 11.6 (L) 11/11/2018 1215   HCT 37.0 (L) 11/11/2018 1215   PLT 178 11/11/2018 1215   MCV 108.2 (H) 11/11/2018 1215   MCH 33.9 11/11/2018 1215   MCHC 31.4 11/11/2018 1215   RDW 15.9 (H) 11/11/2018 1215   LYMPHSABS 2.5 11/11/2018 1215   MONOABS 1.4 (H) 11/11/2018 1215   EOSABS 0.0 11/11/2018 1215   BASOSABS 0.0 11/11/2018 1215   CMP Latest Ref Rng & Units 11/11/2018 11/10/2018 10/27/2018  Glucose 70 - 99 mg/dL 209(H) 175(H) 143(H)  BUN 8 - 23 mg/dL _0 Creatinine 0.61 - 1.24 mg/dL 0.92 0.86 0.89  Sodium 135 - 145 mmol/L 141 140 140  Potassium 3.5 - 5.1 mmol/L 3.8 3.8 4.0  Chloride 98 - 111 mmol/L 107 106 107  CO2 22 - 32 mmol/L 21(L) 24 24  Calcium 8.9 - 10.3 mg/dL 9.4 8.7(L) 8.9  Total Protein 6.5 - 8.1 g/dL 7.2 6.3(L) 6.6  Total Bilirubin 0.3 - 1.2 mg/dL 1.3(H) 1.1 0.6  Alkaline Phos 38 - 126 U/L 46 45 49  AST 15 - 41 U/L _1 ALT 0 - 44 U/L _2 ASSESSMENT & PLAN:   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HCC) 1.  Stage IV colon cancer (pT3 pN1 M1) MSI high: - K-ras mutation negative, BRAF V600 E+, PIK3CA, TP53, CDKN2A -12 cycles of FOLFOX from 08/26/2016 through 01/27/2017 -Opdivo from 02/23/2017 through 07/13/2017 -FOLFIRI and  bevacizumab from 07/27/2017 through 10/13/2018 - CT CAP from 11/08/2018 showed stable disease and abdomen and pelvis.  Mild progression of linear reticulonodular opacities in the lungs likely atypical infectious process. - We have started him on maintenance 5-FU and bevacizumab on 10/27/2018.  He felt better and has gained 3 pounds. -We will continue his maintenance regimen.  This will be continued until progression.   - Patient developed nausea and vomiting after his infusion of 5-FU and Avastin.  He is currently on 5-FU pump.  Nausea and vomiting are most likely chemo induced.  Have recommended IV fluids today as well as IV Phenergan 12.5 mg.  Have also prescribed Zofran ODT for patient to take at home.  We will see patient tomorrow once he gets his pump discontinued.  Patient is clinically stable.  2.  Peripheral neuropathy: -Tingling and numbness in the feet is constant.  He will continue gabapentin 600 mg twice daily.  3.  Diarrhea: -He is taking 1 tablet of Imodium and 1 tablet of Lomotil twice daily as needed.  Since we started maintenance, her diarrhea has improved tremendously.  4.  Weight loss: - He lost weight while he was receiving Irinotecan.  He gained 3 pounds since we discontinued Irinotecan.  Roger Shelter, [place] [phone]

## 2018-11-11 NOTE — Assessment & Plan Note (Signed)
1.  Stage IV colon cancer (pT3 pN1 M1) MSI high: - K-ras mutation negative, BRAF V600 E+, PIK3CA, TP53, CDKN2A -12 cycles of FOLFOX from 08/26/2016 through 01/27/2017 -Opdivo from 02/23/2017 through 07/13/2017 -FOLFIRI and bevacizumab from 07/27/2017 through 10/13/2018 - CT CAP from 11/08/2018 showed stable disease and abdomen and pelvis.  Mild progression of linear reticulonodular opacities in the lungs likely atypical infectious process. - We have started him on maintenance 5-FU and bevacizumab on 10/27/2018.  He felt better and has gained 3 pounds. -We will continue his maintenance regimen.  This will be continued until progression.   - Patient developed nausea and vomiting after his infusion of 5-FU and Avastin.  He is currently on 5-FU pump.  Nausea and vomiting are most likely chemo induced.  Have recommended IV fluids today as well as IV Phenergan 12.5 mg.  Have also prescribed Zofran ODT for patient to take at home.  We will see patient tomorrow once he gets his pump discontinued.  Patient is clinically stable.  2.  Peripheral neuropathy: -Tingling and numbness in the feet is constant.  He will continue gabapentin 600 mg twice daily.  3.  Diarrhea: -He is taking 1 tablet of Imodium and 1 tablet of Lomotil twice daily as needed.  Since we started maintenance, her diarrhea has improved tremendously.  4.  Weight loss: - He lost weight while he was receiving Irinotecan.  He gained 3 pounds since we discontinued Irinotecan.

## 2018-11-11 NOTE — Assessment & Plan Note (Signed)
1.  Stage IV colon cancer (pT3 pN1 M1) MSI high: - K-ras mutation negative, BRAF V600 E+, PIK3CA, TP53, CDKN2A -12 cycles of FOLFOX from 08/26/2016 through 01/27/2017 -Opdivo from 02/23/2017 through 07/13/2017 -FOLFIRI and bevacizumab from 07/27/2017 through 10/13/2018 - CT CAP from 11/08/2018 showed stable disease and abdomen and pelvis.  Mild progression of linear reticulonodular opacities in the lungs likely atypical infectious process. - We have started him on maintenance 5-FU and bevacizumab on 10/27/2018.  He felt better and has gained 3 pounds. -We will continue his maintenance regimen.  This will be continued until progression.  We will reevaluate him in 2 weeks.  2.  Peripheral neuropathy: -Tingling and numbness in the feet is constant.  He will continue gabapentin 600 mg twice daily.  3.  Diarrhea: -He is taking 1 tablet of Imodium and 1 tablet of Lomotil twice daily as needed.  Since we started maintenance, her diarrhea has improved tremendously.  4.  Weight loss: - He lost weight while he was receiving Irinotecan.  He gained 3 pounds since we discontinued Irinotecan.

## 2018-11-11 NOTE — Progress Notes (Addendum)
Patient's granddaughter called clinic this morning stating that patient was weak as water, has been vomiting and now dry heaving. He has tried to take his nausea medication and wasn't able to keep it down.  Charge nurse aware, patient will be coming in for labs and fluids and visit with provider

## 2018-11-11 NOTE — Progress Notes (Signed)
[place], [place] [address]   CLINIC:  Medical Oncology/Hematology  PCP:  Tobe Sos, MD [address] 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[place])   07/27/2017 -  Chemotherapy   The patient had palonosetron (ALOXI) injection 0.25 mg, 0.25 mg, Intravenous,  Once, 34 of 38 cycles Administration: 0.25 mg (07/27/2017), 0.25 mg (08/10/2017), 0.25 mg (08/24/2017), 0.25 mg (09/07/2017), 0.25 mg (09/21/2017), 0.25 mg (10/05/2017), 0.25 mg (10/19/2017), 0.25 mg (11/02/2017), 0.25 mg (11/16/2017), 0.25 mg (11/30/2017), 0.25 mg (12/14/2017), 0.25 mg (12/28/2017), 0.25 mg (01/12/2018), 0.25 mg (01/26/2018), 0.25 mg (02/09/2018), 0.25 mg (02/24/2018), 0.25 mg (03/10/2018), 0.25 mg (03/29/2018), 0.25 mg (04/12/2018), 0.25 mg (04/26/2018), 0.25 mg (05/10/2018), 0.25 mg (05/24/2018), 0.25 mg (06/07/2018), 0.25 mg (06/21/2018), 0.25 mg (07/05/2018), 0.25 mg (07/19/2018), 0.25 mg (08/03/2018), 0.25 mg (08/18/2018), 0.25 mg (09/01/2018), 0.25 mg (09/15/2018), 0.25 mg (09/29/2018), 0.25 mg (10/13/2018), 0.25 mg (10/27/2018), 0.25 mg (11/10/2018) pegfilgrastim-cbqv (UDENYCA) injection 6 mg, 6 mg, Subcutaneous, Once, 7 of 7 cycles Administration: 6 mg (10/07/2017), 6 mg (10/21/2017), 6 mg (11/04/2017), 6 mg (11/18/2017), 6 mg (12/02/2017), 6 mg (12/16/2017), 6 mg (12/30/2017) bevacizumab-bvzr (ZIRABEV) chemo injection 400 mg, 5 mg/kg = 400 mg (original dose ), Intravenous,  Once, 0 of 4 cycles Dose modification: 5 mg/kg (Cycle 35) bevacizumab (AVASTIN) 450 mg in sodium chloride 0.9 % 100 mL chemo infusion, 5 mg/kg = 450 mg, Intravenous,  Once, 34 of 34 cycles Dose modification: 4 mg/kg (80 % of original dose 5 mg/kg, Cycle 5, Reason: Provider Judgment), 5 mg/kg (original  dose 5 mg/kg, Cycle 16, Reason: Other (see comments)) Administration: 450 mg (07/27/2017), 450 mg (08/10/2017), 450 mg (08/24/2017), 450 mg (09/07/2017), 450 mg (09/21/2017), 450 mg (10/05/2017), 450 mg (10/19/2017), 450 mg (11/02/2017), 450 mg (11/16/2017), 450 mg (11/30/2017), 450 mg (12/14/2017), 450 mg (12/28/2017), 400 mg (01/12/2018), 400 mg (01/26/2018), 400 mg (02/09/2018), 400 mg (02/24/2018), 400 mg (03/10/2018), 400 mg (03/29/2018), 400 mg (04/12/2018), 400 mg (04/26/2018), 400 mg (05/10/2018), 400 mg (05/24/2018), 400 mg (06/07/2018), 400 mg (06/21/2018), 400 mg (07/05/2018), 400 mg (07/19/2018), 400 mg (08/03/2018), 400 mg (08/18/2018), 400 mg (09/01/2018), 400 mg (09/15/2018), 400 mg (09/29/2018), 400 mg (10/13/2018), 400 mg (10/27/2018), 400 mg (11/10/2018) irinotecan (CAMPTOSAR) 400 mg in dextrose 5 % 500 mL chemo infusion, 180 mg/m2 = 400 mg, Intravenous,  Once, 32 of 32 cycles Dose modification: 144 mg/m2 (80 % of original dose 180 mg/m2, Cycle 5, Reason: Provider Judgment), 135 mg/m2 (75 % of original dose 180 mg/m2, Cycle 30, Reason: Other (see comments), Comment: diarrhea) Administration: 400 mg (07/27/2017), 400 mg (08/10/2017), 400 mg (08/24/2017), 400 mg (09/07/2017), 320 mg (09/21/2017), 320 mg (10/05/2017), 320 mg (10/19/2017), 320 mg (11/02/2017), 320 mg (11/16/2017), 320 mg (11/30/2017), 320 mg (12/14/2017), 320 mg (12/28/2017), 320 mg (01/12/2018), 320 mg (01/26/2018), 320 mg (02/09/2018), 300 mg (02/24/2018), 300 mg (03/10/2018), 300 mg (03/29/2018), 300 mg (04/12/2018), 300 mg (04/26/2018), 300 mg (05/10/2018), 300 mg (05/24/2018), 300 mg (06/07/2018), 300 mg (06/21/2018), 300 mg (07/05/2018), 300 mg (07/19/2018), 300 mg (08/03/2018), 300 mg (08/18/2018), 300 mg (09/01/2018), 280 mg (09/15/2018), 280 mg (09/29/2018), 280 mg (10/13/2018) leucovorin 800 mg in dextrose 5 % 250 mL infusion, 872 mg, Intravenous,  Once, 34 of 38 cycles Dose modification: 320 mg/m2 (80 %  of original dose 400 mg/m2, Cycle 5, Reason: Provider Judgment) Administration: 800 mg  (07/27/2017), 800 mg (08/10/2017), 800 mg (08/24/2017), 800 mg (09/07/2017), 700 mg (09/21/2017), 700 mg (10/05/2017), 700 mg (10/19/2017), 700 mg (11/02/2017), 700 mg (11/16/2017), 700 mg (11/30/2017), 700 mg (12/14/2017), 700 mg (12/28/2017), 700 mg (01/12/2018), 700 mg (01/26/2018), 700 mg (02/09/2018), 700 mg (02/24/2018), 700 mg (03/10/2018), 700 mg (03/29/2018), 700 mg (04/12/2018), 700 mg (04/26/2018), 700 mg (05/10/2018), 700 mg (05/24/2018), 700 mg (06/07/2018), 700 mg (06/21/2018), 700 mg (07/05/2018), 700 mg (07/19/2018), 700 mg (08/03/2018), 700 mg (08/18/2018), 700 mg (09/01/2018), 700 mg (09/15/2018), 700 mg (09/29/2018), 700 mg (10/13/2018), 700 mg (10/27/2018), 700 mg (11/10/2018) fluorouracil (ADRUCIL) chemo injection 850 mg, 400 mg/m2 = 850 mg, Intravenous,  Once, 34 of 38 cycles Dose modification: 320 mg/m2 (80 % of original dose 400 mg/m2, Cycle 5, Reason: Provider Judgment) Administration: 850 mg (07/27/2017), 850 mg (08/10/2017), 850 mg (08/24/2017), 850 mg (09/07/2017), 700 mg (09/21/2017), 700 mg (10/05/2017), 700 mg (10/19/2017), 700 mg (11/02/2017), 700 mg (11/16/2017), 700 mg (11/30/2017), 700 mg (12/14/2017), 700 mg (12/28/2017), 700 mg (01/12/2018), 700 mg (01/26/2018), 700 mg (02/09/2018), 650 mg (02/24/2018), 650 mg (03/10/2018), 650 mg (03/29/2018), 650 mg (04/12/2018), 650 mg (04/26/2018), 650 mg (05/10/2018), 650 mg (05/24/2018), 650 mg (06/07/2018), 650 mg (06/21/2018), 650 mg (07/05/2018), 650 mg (07/19/2018), 650 mg (08/03/2018), 650 mg (08/18/2018), 650 mg (09/01/2018), 650 mg (09/15/2018), 650 mg (09/29/2018), 650 mg (10/13/2018), 650 mg (10/27/2018), 650 mg (11/10/2018) fluorouracil (ADRUCIL) 5,250 mg in sodium chloride 0.9 % 145 mL chemo infusion, 2,400 mg/m2 = 5,250 mg, Intravenous, 1 Day/Dose, 34 of 38 cycles Dose modification: 1,920 mg/m2 (80 % of original dose 2,400 mg/m2, Cycle 5, Reason: Provider Judgment) Administration: 5,250 mg (07/27/2017), 5,250 mg (08/10/2017), 5,250 mg (08/24/2017), 5,250 mg (09/07/2017), 4,200 mg (09/21/2017), 4,200 mg  (10/05/2017), 4,200 mg (10/19/2017), 4,200 mg (11/02/2017), 4,200 mg (11/16/2017), 4,200 mg (11/30/2017), 4,200 mg (12/14/2017), 4,200 mg (12/28/2017), 4,200 mg (01/12/2018), 4,200 mg (01/26/2018), 4,200 mg (02/09/2018), 3,950 mg (02/24/2018), 3,950 mg (03/10/2018), 3,950 mg (03/29/2018), 3,950 mg (04/12/2018), 3,950 mg (04/26/2018), 3,950 mg (05/10/2018), 3,950 mg (05/24/2018), 3,950 mg (06/07/2018), 3,950 mg (06/21/2018), 3,950 mg (07/05/2018), 3,950 mg (07/19/2018), 3,950 mg (08/03/2018), 3,950 mg (08/18/2018), 3,950 mg (09/01/2018), 3,950 mg (09/15/2018), 3,950 mg (09/29/2018), 3,950 mg (10/13/2018), 3,950 mg (10/27/2018), 3,950 mg (11/10/2018)  for chemotherapy treatment.       CANCER STAGING: Cancer Staging Malignant neoplasm of transverse colon [facility]) Staging form: Colon and Rectum, AJCC 8th Edition - Clinical: cT3, cN1a, cM1 - Signed by Twana First, MD on 03/09/2017 - Pathologic: No stage assigned - Unsigned    INTERVAL HISTORY:  Mr. [place] 85 y.o. male seen for follow-up of stage IV colon cancer.  He had CT scans done on 11/08/2018.  He started maintenance 5-FU, leucovorin and bevacizumab on 10/27/2018.  He denied any diarrhea after last treatment.  Denied any severe tiredness.  No mouth sores were reported.  His numbness in the feet has been stable.  Appetite is 100%.  Energy levels are 75%.  He actually gained 3 pounds from last visit.   REVIEW OF SYSTEMS:  Review of Systems  Neurological: Positive for numbness.  All other systems reviewed and are negative.    PAST MEDICAL/SURGICAL HISTORY:  Past Medical History:  Diagnosis Date  . Chronic pulmonary embolism ([place]) 17/5102   compliction of the valve replacement surgery  . COPD (chronic obstructive pulmonary disease) ([place])   . Diabetes ([place]) 04/22/2016  . H/O aortic valve replacement 10/2008  .  Hypercholesteremia   . Hypertension   . Pulmonary emboli ([place]) 10/2008  . Sleep apnea    Past Surgical History:  Procedure Laterality Date  . AORTIC VALVE  REPLACEMENT     2010  . CARDIAC CATHETERIZATION  2010  . COLONOSCOPY N/A 06/05/2016   Procedure: COLONOSCOPY;  Surgeon: Rogene Houston, MD;  Location: [facility] END[facility] SUITE;  Service: Endoscopy;  Laterality: N/A;  . PARTIAL COLECTOMY N/A 07/11/2016   Procedure: PARTIAL COLECTOMY;  Surgeon: Aviva Signs, MD;  Location: [facility] ORS;  Service: General;  Laterality: N/A;  . PORTACATH PLACEMENT N/A 08/13/2016   Procedure: INSERTION PORT-A-CATH LEFT SUBCLAVIAN;  Surgeon: Aviva Signs, MD;  Location: [facility] ORS;  Service: General;  Laterality: N/A;  . REPLACEMENT TOTAL KNEE     1996  . spenectomy     from trauma     SOCIAL HISTORY:  Social History   Socioeconomic History  . Marital status: Widowed    Spouse name: Not on file  . Number of children: Not on file  . Years of education: Not on file  . Highest education level: Not on file  Occupational History  . Not on file  Social Needs  . Financial resource strain: Not on file  . Food insecurity    Worry: Not on file    Inability: Not on file  . Transportation needs    Medical: Not on file    Non-medical: Not on file  Tobacco Use  . Smoking status: Former Smoker    Years: 15.00    Types: Cigarettes    Quit date: 1964    Years since quitting: 56.6  . Smokeless tobacco: Never Used  Substance and Sexual Activity  . Alcohol use: No  . Drug use: No  . Sexual activity: Yes  Lifestyle  . Physical activity    Days per week: Not on file    Minutes per session: Not on file  . Stress: Not on file  Relationships  . Social Herbalist on phone: Not on file    Gets together: Not on file    Attends religious service: Not on file    Active member of club or organization: Not on file    Attends meetings of clubs or organizations: Not on file    Relationship status: Not on file  . Intimate partner violence    Fear of current or ex partner: Not on file    Emotionally abused: Not on file    Physically abused: Not on file    Forced sexual  activity: Not on file  Other Topics Concern  . Not on file  Social History Narrative  . Not on file    FAMILY HISTORY:  Family History  Problem Relation Age of Onset  . Colon cancer Neg Hx     CURRENT MEDICATIONS:  Outpatient Encounter Medications as of 11/10/2018  Medication Sig Note  . atorvastatin (LIPITOR) 10 MG tablet Take 10 mg by mouth at bedtime.    . Cholecalciferol (VITAMIN D3) 5000 units CAPS Take 5,000 Units by mouth daily.    . diphenoxylate-atropine (LOMOTIL) 2.5-0.025 MG tablet Take 1 tablet by mouth 4 (four) times daily as needed for diarrhea or loose stools.   . gabapentin (NEURONTIN) 600 MG tablet Take 1 tablet (600 mg total) by mouth 2 (two) times daily.   Marland Kitchen lidocaine-prilocaine (EMLA) cream Apply 1 application topically as needed.   Marland Kitchen lisinopril (PRINIVIL,ZESTRIL) 10 MG tablet Take 10 mg by mouth daily.   Marland Kitchen  metoprolol (LOPRESSOR) 50 MG tablet Take 50 mg by mouth 2 (two) times daily.   . Multiple Vitamins-Minerals (CENTRUM SILVER 50+MEN PO) Take 1 tablet by mouth daily.   Marland Kitchen NOVOLIN R FLEXPEN RELION 100 UNIT/ML SOPN INJECT 5 UNITS ONCE DAILY FOR GLUCOSE 150   . ondansetron (ZOFRAN) 4 MG tablet Take 4 mg by mouth 2 (two) times daily. Take one tablet two times a day as needed for nausea.   Marland Kitchen RELION GLUCOSE TEST STRIPS test strip USE 1 STRIP TO CHECK GLUCOSE ONCE DAILY   . vitamin B-12 (CYANOCOBALAMIN) 1000 MCG tablet Take 1,000 mcg by mouth daily.    . [DISCONTINUED] prochlorperazine (COMPAZINE) 10 MG tablet Take 1 tablet (10 mg total) by mouth every 6 (six) hours as needed (Nausea or vomiting). 08/05/2016: New Med   Facility-Administered Encounter Medications as of 11/10/2018  Medication  . [COMPLETED] 0.9 %  sodium chloride infusion  . [COMPLETED] bevacizumab (AVASTIN) 400 mg in sodium chloride 0.9 % 100 mL chemo infusion  . [COMPLETED] dexamethasone (DECADRON) 10 mg in sodium chloride 0.9 % 50 mL IVPB  . [COMPLETED] fluorouracil (ADRUCIL) chemo injection 650 mg  .  [COMPLETED] leucovorin 700 mg in sodium chloride 0.9 % 250 mL infusion  . [COMPLETED] palonosetron (ALOXI) injection 0.25 mg  . sodium chloride flush (NS) 0.9 % injection 10 mL  . [DISCONTINUED] fluorouracil (ADRUCIL) 3,950 mg in sodium chloride 0.9 % 71 mL chemo infusion  . [DISCONTINUED] sodium chloride flush (NS) 0.9 % injection 10 mL    ALLERGIES:  Allergies  Allergen Reactions  . Amoxicillin Hives    Has patient had a PCN reaction causing immediate rash, facial/tongue/throat swelling, SOB or lightheadedness with hypotension: No Has patient had a PCN reaction causing severe rash involving mucus membranes or skin necrosis: Yes Has patient had a PCN reaction that required hospitalization No Has patient had a PCN reaction occurring within the last 10 years: No If all of the above answers are "NO", then may proceed with Cephalosporin use.   . Tamsulosin Hcl     Cant sleep, confusion      PHYSICAL EXAM:  ECOG Performance status: 1  Vitals:   11/10/18 0841  BP: (!) 145/64  Pulse: (!) 59  Resp: 18  Temp: 97.9 F (36.6 C)  SpO2: 100%   Filed Weights   11/10/18 0841  Weight: 167 lb 6.4 oz (75.9 kg)    Physical Exam Vitals signs reviewed.  Constitutional:      Appearance: Normal appearance.  HENT:     Head: Normocephalic.     Nose: Nose normal.     Mouth/Throat:     Mouth: Mucous membranes are moist.     Pharynx: Oropharynx is clear.  Eyes:     Extraocular Movements: Extraocular movements intact.     Conjunctiva/sclera: Conjunctivae normal.  Neck:     Musculoskeletal: Normal range of motion.  Cardiovascular:     Rate and Rhythm: Normal rate and regular rhythm.     Pulses: Normal pulses.     Heart sounds: Normal heart sounds.  Pulmonary:     Effort: Pulmonary effort is normal.     Breath sounds: Normal breath sounds.  Abdominal:     General: Bowel sounds are normal.  Musculoskeletal: Normal range of motion.  Skin:    General: Skin is warm and dry.   Neurological:     General: No focal deficit present.     Mental Status: He is alert and oriented to person, place, and time.  Psychiatric:        Mood and Affect: Mood normal.        Behavior: Behavior normal.        Thought Content: Thought content normal.        Judgment: Judgment normal.      LABORATORY DATA:  I have reviewed the labs as listed.  CBC    Component Value Date/Time   WBC 8.0 11/10/2018 0815   RBC 3.27 (L) 11/10/2018 0815   HGB 11.1 (L) 11/10/2018 0815   HCT 35.3 (L) 11/10/2018 0815   PLT 161 11/10/2018 0815   MCV 108.0 (H) 11/10/2018 0815   MCH 33.9 11/10/2018 0815   MCHC 31.4 11/10/2018 0815   RDW 15.7 (H) 11/10/2018 0815   LYMPHSABS 2.7 11/10/2018 0815   MONOABS 1.2 (H) 11/10/2018 0815   EOSABS 0.2 11/10/2018 0815   BASOSABS 0.0 11/10/2018 0815   CMP Latest Ref Rng & Units 11/10/2018 10/27/2018 10/13/2018  Glucose 70 - 99 mg/dL 175(H) 143(H) 134(H)  BUN 8 - 23 mg/dL _0 Creatinine 0.61 - 1.24 mg/dL 0.86 0.89 0.97  Sodium 135 - 145 mmol/L 140 140 139  Potassium 3.5 - 5.1 mmol/L 3.8 4.0 4.2  Chloride 98 - 111 mmol/L 106 107 104  CO2 22 - 32 mmol/L _1 Calcium 8.9 - 10.3 mg/dL 8.7(L) 8.9 9.0  Total Protein 6.5 - 8.1 g/dL 6.3(L) 6.6 6.2(L)  Total Bilirubin 0.3 - 1.2 mg/dL 1.1 0.6 1.0  Alkaline Phos 38 - 126 U/L 45 49 43  AST 15 - 41 U/L _2 ALT 0 - 44 U/L _3 I have independently reviewed his scans and discussed with the patient.   ASSESSMENT & PLAN:   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[place]) 1.  Stage IV colon cancer (pT3 pN1 M1) MSI high: - K-ras mutation negative, BRAF V600 E+, PIK3CA, TP53, CDKN2A -12 cycles of FOLFOX from 08/26/2016 through 01/27/2017 -Opdivo from 02/23/2017 through 07/13/2017 -FOLFIRI and bevacizumab from 07/27/2017 through 10/13/2018 - CT CAP from 11/08/2018 showed stable disease and abdomen and pelvis.  Mild progression of linear reticulonodular opacities in the lungs likely atypical infectious  process. - We have started him on maintenance 5-FU and bevacizumab on 10/27/2018.  He felt better and has gained 3 pounds. -We will continue his maintenance regimen.  This will be continued until progression.  We will reevaluate him in 2 weeks.  2.  Peripheral neuropathy: -Tingling and numbness in the feet is constant.  He will continue gabapentin 600 mg twice daily.  3.  Diarrhea: -He is taking 1 tablet of Imodium and 1 tablet of Lomotil twice daily as needed.  Since we started maintenance, her diarrhea has improved tremendously.  4.  Weight loss: - He lost weight while he was receiving Irinotecan.  He gained 3 pounds since we discontinued Irinotecan.   Total time spent is 25 minutes with more than 50% of the time spent discussing treatment plan changes, counseling and coordination of care.  Orders placed this encounter:  No orders of the defined types were placed in this encounter.     [place] [phone]

## 2018-11-11 NOTE — Progress Notes (Signed)
Pt presents today for IV hydration, labs, and appointment with RNester NP. VSS. Pt has complaints of vomiting since breakfast. Upon arrival pt vomiting in emesis bag. Granddaughter at the bedside with patient. Pt denies any pain or diarrhea. Pt states, " I ate breakfast and it started after breakfast."   Per RNester NP. After IV hydration may discharge pt home. Pt has appointment for pump d/c tomorrow and IV hydration .   IV hydration given today per MD orders. 12.5mg  of Phenergan given IV push.  Tolerated infusion without adverse affects. Vital signs stable. No complaints at this time. Discharged from clinic via wheel chair. F/U with [facility] as scheduled.

## 2018-11-12 ENCOUNTER — Encounter (HOSPITAL_COMMUNITY): Payer: Medicare Other

## 2018-11-12 ENCOUNTER — Other Ambulatory Visit: Payer: Self-pay

## 2018-11-12 ENCOUNTER — Inpatient Hospital Stay (HOSPITAL_COMMUNITY): Payer: Medicare Other

## 2018-11-12 VITALS — BP 127/61 | HR 51 | Temp 96.9°F | Resp 18

## 2018-11-12 DIAGNOSIS — Z5111 Encounter for antineoplastic chemotherapy: Secondary | ICD-10-CM | POA: Diagnosis not present

## 2018-11-12 DIAGNOSIS — C184 Malignant neoplasm of transverse colon: Secondary | ICD-10-CM

## 2018-11-12 DIAGNOSIS — Z95828 Presence of other vascular implants and grafts: Secondary | ICD-10-CM

## 2018-11-12 MED ORDER — SODIUM CHLORIDE 0.9% FLUSH
10.0000 mL | INTRAVENOUS | Status: DC | PRN
  Administered 2018-11-12: 10 mL
  Filled 2018-11-12: qty 10

## 2018-11-12 MED ORDER — HEPARIN SOD (PORK) LOCK FLUSH 100 UNIT/ML IV SOLN
500.0000 [IU] | Freq: Once | INTRAVENOUS | Status: AC | PRN
  Administered 2018-11-12: 500 [IU]

## 2018-11-12 NOTE — Progress Notes (Signed)
Pt presents for pump d/c today per MD orders. Tolerated infusion without adverse affects. Vital signs stable. No complaints at this time. Discharged from clinic ambulatory. F/U with [facility] as scheduled.  Per RNester NP pt does not need fluids today.

## 2018-11-12 NOTE — Patient Instructions (Signed)
[facility] Discharge Instructions for Patients Receiving Chemotherapy  Today you received the following chemotherapy agents   To help prevent nausea and vomiting after your treatment, we encourage you to take your nausea medication   If you develop nausea and vomiting that is not controlled by your nausea medication, call the clinic.   BELOW ARE SYMPTOMS THAT SHOULD BE REPORTED IMMEDIATELY:  *FEVER GREATER THAN 100.5 F  *CHILLS WITH OR WITHOUT FEVER  NAUSEA AND VOMITING THAT IS NOT CONTROLLED WITH YOUR NAUSEA MEDICATION  *UNUSUAL SHORTNESS OF BREATH  *UNUSUAL BRUISING OR BLEEDING  TENDERNESS IN MOUTH AND THROAT WITH OR WITHOUT PRESENCE OF ULCERS  *URINARY PROBLEMS  *BOWEL PROBLEMS  UNUSUAL RASH Items with * indicate a potential emergency and should be followed up as soon as possible.  Feel free to call the clinic should you have any questions or concerns. The clinic phone number is [phone].  Please show the CHEMO ALERT CARD at check-in to the Emergency Department and triage nurse.   

## 2018-11-18 ENCOUNTER — Ambulatory Visit (INDEPENDENT_AMBULATORY_CARE_PROVIDER_SITE_OTHER): Payer: Medicare Other | Admitting: Otolaryngology

## 2018-11-18 DIAGNOSIS — H903 Sensorineural hearing loss, bilateral: Secondary | ICD-10-CM | POA: Diagnosis not present

## 2018-11-18 DIAGNOSIS — H6122 Impacted cerumen, left ear: Secondary | ICD-10-CM

## 2018-11-24 ENCOUNTER — Encounter (HOSPITAL_COMMUNITY): Payer: Self-pay | Admitting: Hematology

## 2018-11-24 ENCOUNTER — Inpatient Hospital Stay (HOSPITAL_BASED_OUTPATIENT_CLINIC_OR_DEPARTMENT_OTHER): Payer: Medicare Other | Admitting: Hematology

## 2018-11-24 ENCOUNTER — Other Ambulatory Visit: Payer: Self-pay

## 2018-11-24 ENCOUNTER — Inpatient Hospital Stay (HOSPITAL_COMMUNITY): Payer: Medicare Other

## 2018-11-24 ENCOUNTER — Inpatient Hospital Stay (HOSPITAL_COMMUNITY): Payer: Medicare Other | Attending: Hematology

## 2018-11-24 VITALS — BP 142/67 | HR 64 | Temp 96.5°F | Resp 18

## 2018-11-24 DIAGNOSIS — Z79899 Other long term (current) drug therapy: Secondary | ICD-10-CM | POA: Diagnosis not present

## 2018-11-24 DIAGNOSIS — Z5111 Encounter for antineoplastic chemotherapy: Secondary | ICD-10-CM | POA: Insufficient documentation

## 2018-11-24 DIAGNOSIS — C184 Malignant neoplasm of transverse colon: Secondary | ICD-10-CM | POA: Diagnosis not present

## 2018-11-24 DIAGNOSIS — Z87891 Personal history of nicotine dependence: Secondary | ICD-10-CM | POA: Diagnosis not present

## 2018-11-24 DIAGNOSIS — R197 Diarrhea, unspecified: Secondary | ICD-10-CM | POA: Insufficient documentation

## 2018-11-24 DIAGNOSIS — E1142 Type 2 diabetes mellitus with diabetic polyneuropathy: Secondary | ICD-10-CM | POA: Insufficient documentation

## 2018-11-24 DIAGNOSIS — R634 Abnormal weight loss: Secondary | ICD-10-CM | POA: Diagnosis not present

## 2018-11-24 DIAGNOSIS — Z95828 Presence of other vascular implants and grafts: Secondary | ICD-10-CM

## 2018-11-24 LAB — CBC WITH DIFFERENTIAL/PLATELET
Abs Immature Granulocytes: 0.02 10*3/uL (ref 0.00–0.07)
Basophils Absolute: 0 10*3/uL (ref 0.0–0.1)
Basophils Relative: 0 %
Eosinophils Absolute: 0.3 10*3/uL (ref 0.0–0.5)
Eosinophils Relative: 3 %
HCT: 36.9 % — ABNORMAL LOW (ref 39.0–52.0)
Hemoglobin: 11.6 g/dL — ABNORMAL LOW (ref 13.0–17.0)
Immature Granulocytes: 0 %
Lymphocytes Relative: 31 %
Lymphs Abs: 2.7 10*3/uL (ref 0.7–4.0)
MCH: 34.6 pg — ABNORMAL HIGH (ref 26.0–34.0)
MCHC: 31.4 g/dL (ref 30.0–36.0)
MCV: 110.1 fL — ABNORMAL HIGH (ref 80.0–100.0)
Monocytes Absolute: 1.3 10*3/uL — ABNORMAL HIGH (ref 0.1–1.0)
Monocytes Relative: 15 %
Neutro Abs: 4.4 10*3/uL (ref 1.7–7.7)
Neutrophils Relative %: 51 %
Platelets: 169 10*3/uL (ref 150–400)
RBC: 3.35 MIL/uL — ABNORMAL LOW (ref 4.22–5.81)
RDW: 15.5 % (ref 11.5–15.5)
WBC: 8.7 10*3/uL (ref 4.0–10.5)
nRBC: 0 % (ref 0.0–0.2)

## 2018-11-24 LAB — COMPREHENSIVE METABOLIC PANEL
ALT: 21 U/L (ref 0–44)
AST: 30 U/L (ref 15–41)
Albumin: 3.6 g/dL (ref 3.5–5.0)
Alkaline Phosphatase: 39 U/L (ref 38–126)
Anion gap: 6 (ref 5–15)
BUN: 15 mg/dL (ref 8–23)
CO2: 24 mmol/L (ref 22–32)
Calcium: 8.8 mg/dL — ABNORMAL LOW (ref 8.9–10.3)
Chloride: 107 mmol/L (ref 98–111)
Creatinine, Ser: 0.92 mg/dL (ref 0.61–1.24)
GFR calc Af Amer: >60 mL/min (ref >60)
GFR calc non Af Amer: >60 mL/min (ref >60)
Glucose, Bld: 160 mg/dL — ABNORMAL HIGH (ref 70–99)
Potassium: 4 mmol/L (ref 3.5–5.1)
Sodium: 137 mmol/L (ref 135–145)
Total Bilirubin: 1.2 mg/dL (ref 0.3–1.2)
Total Protein: 6.6 g/dL (ref 6.5–8.1)

## 2018-11-24 MED ORDER — SODIUM CHLORIDE 0.9 % IV SOLN
Freq: Once | INTRAVENOUS | Status: AC
  Administered 2018-11-24: 11:00:00 via INTRAVENOUS

## 2018-11-24 MED ORDER — BEVACIZUMAB-BVZR CHEMO INJECTION 400 MG/16ML: 5.0000 mg/kg | Freq: Once | INTRAVENOUS | Status: DC

## 2018-11-24 MED ORDER — PALONOSETRON HCL INJECTION 0.25 MG/5ML
0.2500 mg | Freq: Once | INTRAVENOUS | Status: AC
  Administered 2018-11-24: 0.25 mg via INTRAVENOUS
  Filled 2018-11-24: qty 5

## 2018-11-24 MED ORDER — SODIUM CHLORIDE 0.9 % IV SOLN
700.0000 mg | Freq: Once | INTRAVENOUS | Status: AC
  Administered 2018-11-24: 12:00:00 700 mg via INTRAVENOUS
  Filled 2018-11-24: qty 35

## 2018-11-24 MED ORDER — SODIUM CHLORIDE 0.9 % IV SOLN
1920.0000 mg/m2 | INTRAVENOUS | Status: DC
  Administered 2018-11-24: 3950 mg via INTRAVENOUS
  Filled 2018-11-24: qty 79

## 2018-11-24 MED ORDER — SODIUM CHLORIDE 0.9 % IV SOLN
10.0000 mg | Freq: Once | INTRAVENOUS | Status: AC
  Administered 2018-11-24: 10 mg via INTRAVENOUS
  Filled 2018-11-24: qty 10

## 2018-11-24 MED ORDER — SODIUM CHLORIDE 0.9 % IV SOLN
Freq: Once | INTRAVENOUS | Status: AC
  Administered 2018-11-24: 11:00:00 via INTRAVENOUS
  Filled 2018-11-24: qty 5

## 2018-11-24 MED ORDER — SODIUM CHLORIDE 0.9 % IV SOLN
4.8000 mg/kg | Freq: Once | INTRAVENOUS | Status: AC
  Administered 2018-11-24: 400 mg via INTRAVENOUS
  Filled 2018-11-24: qty 16

## 2018-11-24 MED ORDER — FLUOROURACIL CHEMO INJECTION 2.5 GM/50ML
320.0000 mg/m2 | Freq: Once | INTRAVENOUS | Status: AC
  Administered 2018-11-24: 650 mg via INTRAVENOUS
  Filled 2018-11-24: qty 13

## 2018-11-24 MED ORDER — SODIUM CHLORIDE 0.9% FLUSH
10.0000 mL | INTRAVENOUS | Status: DC | PRN
  Administered 2018-11-24: 10 mL
  Filled 2018-11-24: qty 10

## 2018-11-24 NOTE — Patient Instructions (Signed)
[facility] Discharge Instructions for Patients Receiving Chemotherapy   Beginning January 23rd 2017 lab work for the Cancer Center will be done in the  Main lab at [place] on 1st floor. If you have a lab appointment with the Cancer Center please come in thru the  Main Entrance and check in at the main information desk   Today you received the following chemotherapy agents Avastin,Leucovorin and 5FU. Follow-up as scheduled. Call clinic for any questions or concerns  To help prevent nausea and vomiting after your treatment, we encourage you to take your nausea medication    If you develop nausea and vomiting, or diarrhea that is not controlled by your medication, call the clinic.  The clinic phone number is [phone]. Office hours are Monday-Friday 8:30am-5:00pm.  BELOW ARE SYMPTOMS THAT SHOULD BE REPORTED IMMEDIATELY:  *FEVER GREATER THAN 101.0 F  *CHILLS WITH OR WITHOUT FEVER  NAUSEA AND VOMITING THAT IS NOT CONTROLLED WITH YOUR NAUSEA MEDICATION  *UNUSUAL SHORTNESS OF BREATH  *UNUSUAL BRUISING OR BLEEDING  TENDERNESS IN MOUTH AND THROAT WITH OR WITHOUT PRESENCE OF ULCERS  *URINARY PROBLEMS  *BOWEL PROBLEMS  UNUSUAL RASH Items with * indicate a potential emergency and should be followed up as soon as possible. If you have an emergency after office hours please contact your primary care physician or go to the nearest emergency department.  Please call the clinic during office hours if you have any questions or concerns.   You may also contact the Patient Navigator at [phone] should you have any questions or need assistance in obtaining follow up care.      Resources For Cancer Patients and their Caregivers ? American Cancer Society: Can assist with transportation, wigs, general needs, runs Look Good Feel Better.        1-888-227-6333 ? Cancer Care: Provides financial assistance, online support groups, medication/co-pay assistance.   1-800-813-HOPE (4673) ? [facility] Assists [place] cancer patients and their families through emotional , educational and financial support.  336-427-4357 ? [place] DSS Where to apply for food stamps, Medicaid and utility assistance. 336-342-1394 ? [facility]: Transportation to medical appointments. 336-347-2287 ? Social Security Administration: May apply for disability if have a Stage IV cancer. 336-342-7796 1-800-772-1213 ? [place] [facility]ging, Disability and Transit Services: Assists with nutrition, care and transit needs. 336-349-2343         

## 2018-11-24 NOTE — Progress Notes (Signed)
 [facility] [address] [place], [place] [address]   CLINIC:  Medical Oncology/Hematology  PCP:  Pradhan, Pradeep K, MD [address] [place] [address] [phone]   REASON FOR VISIT:  Follow-up for Colon Cancer  CURRENT THERAPY: FOLFIRI and Avastin   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HCC)  07/11/2016 Initial Diagnosis   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HCC)   07/27/2017 -  Chemotherapy   The patient had palonosetron (ALOXI) injection 0.25 mg, 0.25 mg, Intravenous,  Once, 35 of 38 cycles Administration: 0.25 mg (07/27/2017), 0.25 mg (08/10/2017), 0.25 mg (08/24/2017), 0.25 mg (09/07/2017), 0.25 mg (09/21/2017), 0.25 mg (10/05/2017), 0.25 mg (10/19/2017), 0.25 mg (11/02/2017), 0.25 mg (11/16/2017), 0.25 mg (11/30/2017), 0.25 mg (12/14/2017), 0.25 mg (12/28/2017), 0.25 mg (01/12/2018), 0.25 mg (01/26/2018), 0.25 mg (02/09/2018), 0.25 mg (02/24/2018), 0.25 mg (03/10/2018), 0.25 mg (03/29/2018), 0.25 mg (04/12/2018), 0.25 mg (04/26/2018), 0.25 mg (05/10/2018), 0.25 mg (05/24/2018), 0.25 mg (06/07/2018), 0.25 mg (06/21/2018), 0.25 mg (07/05/2018), 0.25 mg (07/19/2018), 0.25 mg (08/03/2018), 0.25 mg (08/18/2018), 0.25 mg (09/01/2018), 0.25 mg (09/15/2018), 0.25 mg (09/29/2018), 0.25 mg (10/13/2018), 0.25 mg (10/27/2018), 0.25 mg (11/10/2018), 0.25 mg (11/24/2018) pegfilgrastim-cbqv (UDENYCA) injection 6 mg, 6 mg, Subcutaneous, Once, 7 of 7 cycles Administration: 6 mg (10/07/2017), 6 mg (10/21/2017), 6 mg (11/04/2017), 6 mg (11/18/2017), 6 mg (12/02/2017), 6 mg (12/16/2017), 6 mg (12/30/2017) bevacizumab-bvzr (ZIRABEV) chemo injection 400 mg, 5 mg/kg = 400 mg (100 % of original dose 5 mg/kg), Intravenous,  Once, 1 of 4 cycles Dose modification: 5 mg/kg (original dose 5 mg/kg, Cycle 35) bevacizumab (AVASTIN) 450 mg in sodium chloride 0.9 % 100 mL chemo infusion, 5 mg/kg = 450 mg, Intravenous,  Once, 35 of 35 cycles Dose modification: 4 mg/kg (80 % of original dose 5  mg/kg, Cycle 5, Reason: Provider Judgment), 5 mg/kg (original dose 5 mg/kg, Cycle 16, Reason: Other (see comments)), 5 mg/kg (original dose 5 mg/kg, Cycle 35) Administration: 450 mg (07/27/2017), 450 mg (08/10/2017), 450 mg (08/24/2017), 450 mg (09/07/2017), 450 mg (09/21/2017), 450 mg (10/05/2017), 450 mg (10/19/2017), 450 mg (11/02/2017), 450 mg (11/16/2017), 450 mg (11/30/2017), 450 mg (12/14/2017), 450 mg (12/28/2017), 400 mg (01/12/2018), 400 mg (01/26/2018), 400 mg (02/09/2018), 400 mg (02/24/2018), 400 mg (03/10/2018), 400 mg (03/29/2018), 400 mg (04/12/2018), 400 mg (04/26/2018), 400 mg (05/10/2018), 400 mg (05/24/2018), 400 mg (06/07/2018), 400 mg (06/21/2018), 400 mg (07/05/2018), 400 mg (07/19/2018), 400 mg (08/03/2018), 400 mg (08/18/2018), 400 mg (09/01/2018), 400 mg (09/15/2018), 400 mg (09/29/2018), 400 mg (10/13/2018), 400 mg (10/27/2018), 400 mg (11/10/2018), 400 mg (11/24/2018) irinotecan (CAMPTOSAR) 400 mg in dextrose 5 % 500 mL chemo infusion, 180 mg/m2 = 400 mg, Intravenous,  Once, 32 of 32 cycles Dose modification: 144 mg/m2 (80 % of original dose 180 mg/m2, Cycle 5, Reason: Provider Judgment), 135 mg/m2 (75 % of original dose 180 mg/m2, Cycle 30, Reason: Other (see comments), Comment: diarrhea) Administration: 400 mg (07/27/2017), 400 mg (08/10/2017), 400 mg (08/24/2017), 400 mg (09/07/2017), 320 mg (09/21/2017), 320 mg (10/05/2017), 320 mg (10/19/2017), 320 mg (11/02/2017), 320 mg (11/16/2017), 320 mg (11/30/2017), 320 mg (12/14/2017), 320 mg (12/28/2017), 320 mg (01/12/2018), 320 mg (01/26/2018), 320 mg (02/09/2018), 300 mg (02/24/2018), 300 mg (03/10/2018), 300 mg (03/29/2018), 300 mg (04/12/2018), 300 mg (04/26/2018), 300 mg (05/10/2018), 300 mg (05/24/2018), 300 mg (06/07/2018), 300 mg (06/21/2018), 300 mg (07/05/2018), 300 mg (07/19/2018), 300 mg (08/03/2018), 300 mg (08/18/2018), 300 mg (09/01/2018), 280 mg (09/15/2018), 280 mg (09/29/2018), 280 mg (10/13/2018) leucovorin 800   mg in dextrose 5 % 250 mL infusion, 872 mg, Intravenous,  Once, 35 of 38 cycles  Dose modification: 320 mg/m2 (80 % of original dose 400 mg/m2, Cycle 5, Reason: Provider Judgment) Administration: 800 mg (07/27/2017), 800 mg (08/10/2017), 800 mg (08/24/2017), 800 mg (09/07/2017), 700 mg (09/21/2017), 700 mg (10/05/2017), 700 mg (10/19/2017), 700 mg (11/02/2017), 700 mg (11/16/2017), 700 mg (11/30/2017), 700 mg (12/14/2017), 700 mg (12/28/2017), 700 mg (01/12/2018), 700 mg (01/26/2018), 700 mg (02/09/2018), 700 mg (02/24/2018), 700 mg (03/10/2018), 700 mg (03/29/2018), 700 mg (04/12/2018), 700 mg (04/26/2018), 700 mg (05/10/2018), 700 mg (05/24/2018), 700 mg (06/07/2018), 700 mg (06/21/2018), 700 mg (07/05/2018), 700 mg (07/19/2018), 700 mg (08/03/2018), 700 mg (08/18/2018), 700 mg (09/01/2018), 700 mg (09/15/2018), 700 mg (09/29/2018), 700 mg (10/13/2018), 700 mg (10/27/2018), 700 mg (11/10/2018), 700 mg (11/24/2018) fluorouracil (ADRUCIL) chemo injection 850 mg, 400 mg/m2 = 850 mg, Intravenous,  Once, 35 of 38 cycles Dose modification: 320 mg/m2 (80 % of original dose 400 mg/m2, Cycle 5, Reason: Provider Judgment) Administration: 850 mg (07/27/2017), 850 mg (08/10/2017), 850 mg (08/24/2017), 850 mg (09/07/2017), 700 mg (09/21/2017), 700 mg (10/05/2017), 700 mg (10/19/2017), 700 mg (11/02/2017), 700 mg (11/16/2017), 700 mg (11/30/2017), 700 mg (12/14/2017), 700 mg (12/28/2017), 700 mg (01/12/2018), 700 mg (01/26/2018), 700 mg (02/09/2018), 650 mg (02/24/2018), 650 mg (03/10/2018), 650 mg (03/29/2018), 650 mg (04/12/2018), 650 mg (04/26/2018), 650 mg (05/10/2018), 650 mg (05/24/2018), 650 mg (06/07/2018), 650 mg (06/21/2018), 650 mg (07/05/2018), 650 mg (07/19/2018), 650 mg (08/03/2018), 650 mg (08/18/2018), 650 mg (09/01/2018), 650 mg (09/15/2018), 650 mg (09/29/2018), 650 mg (10/13/2018), 650 mg (10/27/2018), 650 mg (11/10/2018), 650 mg (11/24/2018) fosaprepitant (EMEND) 150 mg in sodium chloride 0.9 % 145 mL IVPB, , Intravenous,  Once, 1 of 4 cycles Administration:  (11/24/2018) fluorouracil (ADRUCIL) 5,250 mg in sodium chloride 0.9 % 145 mL chemo infusion, 2,400 mg/m2 =  5,250 mg, Intravenous, 1 Day/Dose, 35 of 38 cycles Dose modification: 1,920 mg/m2 (80 % of original dose 2,400 mg/m2, Cycle 5, Reason: Provider Judgment) Administration: 5,250 mg (07/27/2017), 5,250 mg (08/10/2017), 5,250 mg (08/24/2017), 5,250 mg (09/07/2017), 4,200 mg (09/21/2017), 4,200 mg (10/05/2017), 4,200 mg (10/19/2017), 4,200 mg (11/02/2017), 4,200 mg (11/16/2017), 4,200 mg (11/30/2017), 4,200 mg (12/14/2017), 4,200 mg (12/28/2017), 4,200 mg (01/12/2018), 4,200 mg (01/26/2018), 4,200 mg (02/09/2018), 3,950 mg (02/24/2018), 3,950 mg (03/10/2018), 3,950 mg (03/29/2018), 3,950 mg (04/12/2018), 3,950 mg (04/26/2018), 3,950 mg (05/10/2018), 3,950 mg (05/24/2018), 3,950 mg (06/07/2018), 3,950 mg (06/21/2018), 3,950 mg (07/05/2018), 3,950 mg (07/19/2018), 3,950 mg (08/03/2018), 3,950 mg (08/18/2018), 3,950 mg (09/01/2018), 3,950 mg (09/15/2018), 3,950 mg (09/29/2018), 3,950 mg (10/13/2018), 3,950 mg (10/27/2018), 3,950 mg (11/10/2018), 3,950 mg (11/24/2018)  for chemotherapy treatment.       CANCER STAGING: Cancer Staging 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HCC) Staging form: Colon and Rectum, AJCC 8th Edition - Clinical: cT3, cN1a, cM1 - Signed by Zhou, Louise, MD on 03/09/2017 - Pathologic: No stage assigned - Unsigned    INTERVAL HISTORY:  Mr. Bulls 86 y.o. male seen for stage IV colon cancer.  He is receiving maintenance therapy with 5-FU and bevacizumab.  After last treatment, on second day, he required IV fluids secondary to nausea and vomiting.  He also had 1 day of diarrhea.  Appetite is 100%.  Energy levels are 75%.  He did not gain or lose any weight.  Occasional dizziness is also stable.  Denies any fevers or night sweats.  Numbness in the feet has been stable.   REVIEW OF SYSTEMS:  Review of Systems  Gastrointestinal:   Positive for diarrhea and vomiting.  Neurological: Positive for dizziness and numbness.  All other systems reviewed and are negative.    PAST MEDICAL/SURGICAL HISTORY:  Past Medical History:   Diagnosis Date  . Chronic pulmonary embolism (HCC) 10/2008   compliction of the valve replacement surgery  . COPD (chronic obstructive pulmonary disease) (HCC)   . Diabetes (HCC) 04/22/2016  . H/O aortic valve replacement 10/2008  . Hypercholesteremia   . Hypertension   . Pulmonary emboli (HCC) 10/2008  . Sleep apnea    Past Surgical History:  Procedure Laterality Date  . AORTIC VALVE REPLACEMENT     2010  . CARDIAC CATHETERIZATION  2010  . COLONOSCOPY N/A 06/05/2016   Procedure: COLONOSCOPY;  Surgeon: Najeeb U Rehman, MD;  Location: [facility];  Service: Endoscopy;  Laterality: N/A;  . PARTIAL COLECTOMY N/A 07/11/2016   Procedure: PARTIAL COLECTOMY;  Surgeon: Mark Jenkins, MD;  Location: [facility] ORS;  Service: General;  Laterality: N/A;  . PORTACATH PLACEMENT N/A 08/13/2016   Procedure: INSERTION PORT-A-CATH LEFT SUBCLAVIAN;  Surgeon: Jenkins, Mark, MD;  Location: [facility] ORS;  Service: General;  Laterality: N/A;  . REPLACEMENT TOTAL KNEE     1996  . spenectomy     from trauma     SOCIAL HISTORY:  Social History   Socioeconomic History  . Marital status: Widowed    Spouse name: Not on file  . Number of children: Not on file  . Years of education: Not on file  . Highest education level: Not on file  Occupational History  . Not on file  Social Needs  . Financial resource strain: Not on file  . Food insecurity    Worry: Not on file    Inability: Not on file  . Transportation needs    Medical: Not on file    Non-medical: Not on file  Tobacco Use  . Smoking status: Former Smoker    Years: 15.00    Types: Cigarettes    Quit date: 1964    Years since quitting: 56.7  . Smokeless tobacco: Never Used  Substance and Sexual Activity  . Alcohol use: No  . Drug use: No  . Sexual activity: Yes  Lifestyle  . Physical activity    Days per week: Not on file    Minutes per session: Not on file  . Stress: Not on file  Relationships  . Social connections    Talks on phone: Not on  file    Gets together: Not on file    Attends religious service: Not on file    Active member of club or organization: Not on file    Attends meetings of clubs or organizations: Not on file    Relationship status: Not on file  . Intimate partner violence    Fear of current or ex partner: Not on file    Emotionally abused: Not on file    Physically abused: Not on file    Forced sexual activity: Not on file  Other Topics Concern  . Not on file  Social History Narrative  . Not on file    FAMILY HISTORY:  Family History  Problem Relation Age of Onset  . Colon cancer Neg Hx     CURRENT MEDICATIONS:  Outpatient Encounter Medications as of 11/24/2018  Medication Sig Note  . atorvastatin (LIPITOR) 10 MG tablet Take 10 mg by mouth at bedtime.    . Cholecalciferol (VITAMIN D3) 5000 units CAPS Take 5,000 Units by mouth daily.    .   lidocaine-prilocaine (EMLA) cream Apply 1 application topically as needed.   . lisinopril (PRINIVIL,ZESTRIL) 10 MG tablet Take 10 mg by mouth daily.   . metoprolol (LOPRESSOR) 50 MG tablet Take 50 mg by mouth 2 (two) times daily.   . Multiple Vitamins-Minerals (CENTRUM SILVER 50+MEN PO) Take 1 tablet by mouth daily.   . NOVOLIN R FLEXPEN RELION 100 UNIT/ML SOPN INJECT 5 UNITS ONCE DAILY FOR GLUCOSE 150   . ondansetron (ZOFRAN ODT) 8 MG disintegrating tablet Take 1 tablet (8 mg total) by mouth every 8 (eight) hours as needed for nausea or vomiting.   . RELION GLUCOSE TEST STRIPS test strip USE 1 STRIP TO CHECK GLUCOSE ONCE DAILY   . vitamin B-12 (CYANOCOBALAMIN) 1000 MCG tablet Take 1,000 mcg by mouth daily.    . diphenoxylate-atropine (LOMOTIL) 2.5-0.025 MG tablet Take 1 tablet by mouth 4 (four) times daily as needed for diarrhea or loose stools.   . gabapentin (NEURONTIN) 600 MG tablet Take 1 tablet (600 mg total) by mouth 2 (two) times daily.   . [DISCONTINUED] prochlorperazine (COMPAZINE) 10 MG tablet Take 1 tablet (10 mg total) by mouth every 6 (six) hours as  needed (Nausea or vomiting). 08/05/2016: New Med   Facility-Administered Encounter Medications as of 11/24/2018  Medication  . sodium chloride flush (NS) 0.9 % injection 10 mL    ALLERGIES:  Allergies  Allergen Reactions  . Amoxicillin Hives    Has patient had a PCN reaction causing immediate rash, facial/tongue/throat swelling, SOB or lightheadedness with hypotension: No Has patient had a PCN reaction causing severe rash involving mucus membranes or skin necrosis: Yes Has patient had a PCN reaction that required hospitalization No Has patient had a PCN reaction occurring within the last 10 years: No If all of the above answers are "NO", then may proceed with Cephalosporin use.   . Tamsulosin Hcl     Cant sleep, confusion      PHYSICAL EXAM:  ECOG Performance status: 1  Vitals:   11/24/18 0844  BP: 125/63  Pulse: 70  Resp: 18  Temp: 97.9 F (36.6 C)  SpO2: 98%   Filed Weights   11/24/18 0844  Weight: 163 lb 6.4 oz (74.1 kg)    Physical Exam Vitals signs reviewed.  Constitutional:      Appearance: Normal appearance.  HENT:     Head: Normocephalic.     Nose: Nose normal.     Mouth/Throat:     Mouth: Mucous membranes are moist.     Pharynx: Oropharynx is clear.  Eyes:     Extraocular Movements: Extraocular movements intact.     Conjunctiva/sclera: Conjunctivae normal.  Neck:     Musculoskeletal: Normal range of motion.  Cardiovascular:     Rate and Rhythm: Normal rate and regular rhythm.     Pulses: Normal pulses.     Heart sounds: Normal heart sounds.  Pulmonary:     Effort: Pulmonary effort is normal.     Breath sounds: Normal breath sounds.  Abdominal:     General: Bowel sounds are normal.  Musculoskeletal: Normal range of motion.  Skin:    General: Skin is warm and dry.  Neurological:     General: No focal deficit present.     Mental Status: He is alert and oriented to person, place, and time.  Psychiatric:        Mood and Affect: Mood normal.         Behavior: Behavior normal.          Thought Content: Thought content normal.        Judgment: Judgment normal.      LABORATORY DATA:  I have reviewed the labs as listed.  CBC    Component Value Date/Time   WBC 8.7 11/24/2018 0840   RBC 3.35 (L) 11/24/2018 0840   HGB 11.6 (L) 11/24/2018 0840   HCT 36.9 (L) 11/24/2018 0840   PLT 169 11/24/2018 0840   MCV 110.1 (H) 11/24/2018 0840   MCH 34.6 (H) 11/24/2018 0840   MCHC 31.4 11/24/2018 0840   RDW 15.5 11/24/2018 0840   LYMPHSABS 2.7 11/24/2018 0840   MONOABS 1.3 (H) 11/24/2018 0840   EOSABS 0.3 11/24/2018 0840   BASOSABS 0.0 11/24/2018 0840   CMP Latest Ref Rng & Units 11/24/2018 11/11/2018 11/10/2018  Glucose 70 - 99 mg/dL 160(H) 209(H) 175(H)  BUN 8 - 23 mg/dL 15 23 13  Creatinine 0.61 - 1.24 mg/dL 0.92 0.92 0.86  Sodium 135 - 145 mmol/L 137 141 140  Potassium 3.5 - 5.1 mmol/L 4.0 3.8 3.8  Chloride 98 - 111 mmol/L 107 107 106  CO2 22 - 32 mmol/L 24 21(L) 24  Calcium 8.9 - 10.3 mg/dL 8.8(L) 9.4 8.7(L)  Total Protein 6.5 - 8.1 g/dL 6.6 7.2 6.3(L)  Total Bilirubin 0.3 - 1.2 mg/dL 1.2 1.3(H) 1.1  Alkaline Phos 38 - 126 U/L 39 46 45  AST 15 - 41 U/L 30 31 28  ALT 0 - 44 U/L 21 28 23     I have independently reviewed his scans and discussed with the patient.   ASSESSMENT & PLAN:   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HCC) 1.  Stage IV colon cancer (pT3 pN1 M1) MSI high: - K-ras mutation negative, BRAF V600 E+, PIK3CA, TP53, CDKN2A -12 cycles of FOLFOX from 08/26/2016 through 01/27/2017 -Opdivo from 02/23/2017 through 07/13/2017 -FOLFIRI and bevacizumab from 07/27/2017 through 10/13/2018 - CT CAP from 11/08/2018 showed stable disease and abdomen and pelvis.  Mild progression of linear reticulonodular opacities in the lungs likely atypical infectious process. - 5-FU and bevacizumab maintenance started on 10/27/2018. -He is tolerating maintenance very well.  He is gaining weight.  He had diarrhea for 1 day.  He also had nausea and  vomiting on second day and required IV fluids. -Hence I have recommended adding Emend to the premeds. -I have reviewed his labs.  He will proceed with his next treatment today.  He will be seen back in 2 weeks for follow-up.  2.  Peripheral neuropathy: -Tingling and numbness in the feet is constant.  He will continue gabapentin 600 mg twice daily.  3.  Diarrhea: -He takes Imodium and Lomotil as needed.  4.  Weight loss: - He gained 3 pounds since we discontinued Irinotecan.  He is drinking 2 ensures per day and eating 3 meals per day.   Total time spent is 25 minutes with more than 50% of the time spent discussing treatment plan changes, counseling and coordination of care.  Orders placed this encounter:  No orders of the defined types were placed in this encounter.     Sreedhar Katragadda  [facility] [phone]   

## 2018-11-24 NOTE — Patient Instructions (Signed)
[facility] at [facility] Discharge Instructions  You were seen today by Dr. Katragadda. He went over your recent lab results. He will see you back in 2 weeks for labs and follow up.   Thank you for choosing [facility] at [facility] to provide your oncology and hematology care.  To afford each patient quality time with our provider, please arrive at least 15 minutes before your scheduled appointment time.   If you have a lab appointment with the Cancer Center please come in thru the  Main Entrance and check in at the main information desk  You need to re-schedule your appointment should you arrive 10 or more minutes late.  We strive to give you quality time with our providers, and arriving late affects you and other patients whose appointments are after yours.  Also, if you no show three or more times for appointments you may be dismissed from the clinic at the providers discretion.     Again, thank you for choosing [facility].  Our hope is that these requests will decrease the amount of time that you wait before being seen by our physicians.       _____________________________________________________________  Should you have questions after your visit to [facility], please contact our office at [phone] between the hours of 8:00 a.m. and 4:30 p.m.  Voicemails left after 4:00 p.m. will not be returned until the following business day.  For prescription refill requests, have your pharmacy contact our office and allow 72 hours.    Cancer Center Support Programs:   > Cancer Support Group  2nd Tuesday of the month 1pm-2pm, Journey Room    

## 2018-11-24 NOTE — Progress Notes (Signed)
11/24/18  Patient with increased nausea/vomiting, requested by Dr to add Emend IV to plan and maintain dexamethasone dose at 10 mg IV.  Give Avastin today and being Zirabev with next cycle due to non-availability.  T.O. Dr Rhys Martini, PharmD

## 2018-11-24 NOTE — Progress Notes (Signed)
[id number] Labs reviewed with and pt seen by Dr. Delton Coombes and pt approved for chemo tx today per MD                            Kevin Kirk tolerated chemo tx well without complaints or incident. Pt discharged with 5FU pump infusing without issues. VSS upon discharge. Pt discharged self ambulatory using his cane in satisfactory condition

## 2018-11-26 ENCOUNTER — Other Ambulatory Visit: Payer: Self-pay

## 2018-11-26 ENCOUNTER — Encounter (HOSPITAL_COMMUNITY): Payer: Medicare Other

## 2018-11-26 ENCOUNTER — Inpatient Hospital Stay (HOSPITAL_COMMUNITY): Payer: Medicare Other

## 2018-11-26 ENCOUNTER — Encounter (HOSPITAL_COMMUNITY): Payer: Self-pay

## 2018-11-26 VITALS — BP 128/56 | HR 66 | Temp 97.7°F | Resp 18

## 2018-11-26 DIAGNOSIS — C184 Malignant neoplasm of transverse colon: Secondary | ICD-10-CM

## 2018-11-26 DIAGNOSIS — Z5111 Encounter for antineoplastic chemotherapy: Secondary | ICD-10-CM | POA: Diagnosis not present

## 2018-11-26 DIAGNOSIS — Z95828 Presence of other vascular implants and grafts: Secondary | ICD-10-CM

## 2018-11-26 MED ORDER — SODIUM CHLORIDE 0.9% FLUSH
10.0000 mL | INTRAVENOUS | Status: DC | PRN
  Administered 2018-11-26: 10 mL
  Filled 2018-11-26: qty 10

## 2018-11-26 MED ORDER — HEPARIN SOD (PORK) LOCK FLUSH 100 UNIT/ML IV SOLN
500.0000 [IU] | Freq: Once | INTRAVENOUS | Status: AC | PRN
  Administered 2018-11-26: 500 [IU]

## 2018-11-26 NOTE — Progress Notes (Signed)
Patients port flushed without difficulty.  Good blood return noted with no bruising or swelling noted at site.  Band aid applied.  VSS with discharge and left ambulatory with no s/s of distress noted.  

## 2018-11-27 NOTE — Assessment & Plan Note (Signed)
1.  Stage IV colon cancer (pT3 pN1 M1) MSI high: - K-ras mutation negative, BRAF V600 E+, PIK3CA, TP53, CDKN2A -12 cycles of FOLFOX from 08/26/2016 through 01/27/2017 -Opdivo from 02/23/2017 through 07/13/2017 -FOLFIRI and bevacizumab from 07/27/2017 through 10/13/2018 - CT CAP from 11/08/2018 showed stable disease and abdomen and pelvis.  Mild progression of linear reticulonodular opacities in the lungs likely atypical infectious process. - 5-FU and bevacizumab maintenance started on 10/27/2018. -He is tolerating maintenance very well.  He is gaining weight.  He had diarrhea for 1 day.  He also had nausea and vomiting on second day and required IV fluids. -Hence I have recommended adding Emend to the premeds. -I have reviewed his labs.  He will proceed with his next treatment today.  He will be seen back in 2 weeks for follow-up.  2.  Peripheral neuropathy: -Tingling and numbness in the feet is constant.  He will continue gabapentin 600 mg twice daily.  3.  Diarrhea: -He takes Imodium and Lomotil as needed.  4.  Weight loss: - He gained 3 pounds since we discontinued Irinotecan.  He is drinking 2 ensures per day and eating 3 meals per day.

## 2018-12-08 ENCOUNTER — Encounter (HOSPITAL_COMMUNITY): Payer: Self-pay | Admitting: Hematology

## 2018-12-08 ENCOUNTER — Inpatient Hospital Stay (HOSPITAL_COMMUNITY): Payer: Medicare Other

## 2018-12-08 ENCOUNTER — Inpatient Hospital Stay (HOSPITAL_BASED_OUTPATIENT_CLINIC_OR_DEPARTMENT_OTHER): Payer: Medicare Other | Admitting: Hematology

## 2018-12-08 ENCOUNTER — Other Ambulatory Visit: Payer: Self-pay

## 2018-12-08 VITALS — BP 155/68 | HR 71 | Temp 97.7°F | Resp 18

## 2018-12-08 DIAGNOSIS — C184 Malignant neoplasm of transverse colon: Secondary | ICD-10-CM

## 2018-12-08 DIAGNOSIS — Z95828 Presence of other vascular implants and grafts: Secondary | ICD-10-CM

## 2018-12-08 DIAGNOSIS — Z5111 Encounter for antineoplastic chemotherapy: Secondary | ICD-10-CM | POA: Diagnosis not present

## 2018-12-08 LAB — COMPREHENSIVE METABOLIC PANEL
ALT: 24 U/L (ref 0–44)
AST: 31 U/L (ref 15–41)
Albumin: 3.5 g/dL (ref 3.5–5.0)
Alkaline Phosphatase: 45 U/L (ref 38–126)
Anion gap: 8 (ref 5–15)
BUN: 15 mg/dL (ref 8–23)
CO2: 24 mmol/L (ref 22–32)
Calcium: 8.8 mg/dL — ABNORMAL LOW (ref 8.9–10.3)
Chloride: 107 mmol/L (ref 98–111)
Creatinine, Ser: 0.92 mg/dL (ref 0.61–1.24)
GFR calc Af Amer: >60 mL/min (ref >60)
GFR calc non Af Amer: >60 mL/min (ref >60)
Glucose, Bld: 149 mg/dL — ABNORMAL HIGH (ref 70–99)
Potassium: 3.9 mmol/L (ref 3.5–5.1)
Sodium: 139 mmol/L (ref 135–145)
Total Bilirubin: 0.9 mg/dL (ref 0.3–1.2)
Total Protein: 6.4 g/dL — ABNORMAL LOW (ref 6.5–8.1)

## 2018-12-08 LAB — CBC WITH DIFFERENTIAL/PLATELET
Abs Immature Granulocytes: 0.02 10*3/uL (ref 0.00–0.07)
Basophils Absolute: 0 10*3/uL (ref 0.0–0.1)
Basophils Relative: 1 %
Eosinophils Absolute: 0.3 10*3/uL (ref 0.0–0.5)
Eosinophils Relative: 4 %
HCT: 36.9 % — ABNORMAL LOW (ref 39.0–52.0)
Hemoglobin: 11.7 g/dL — ABNORMAL LOW (ref 13.0–17.0)
Immature Granulocytes: 0 %
Lymphocytes Relative: 28 %
Lymphs Abs: 2.4 10*3/uL (ref 0.7–4.0)
MCH: 35 pg — ABNORMAL HIGH (ref 26.0–34.0)
MCHC: 31.7 g/dL (ref 30.0–36.0)
MCV: 110.5 fL — ABNORMAL HIGH (ref 80.0–100.0)
Monocytes Absolute: 1.1 10*3/uL — ABNORMAL HIGH (ref 0.1–1.0)
Monocytes Relative: 13 %
Neutro Abs: 4.7 10*3/uL (ref 1.7–7.7)
Neutrophils Relative %: 54 %
Platelets: 160 10*3/uL (ref 150–400)
RBC: 3.34 MIL/uL — ABNORMAL LOW (ref 4.22–5.81)
RDW: 15.3 % (ref 11.5–15.5)
WBC: 8.6 10*3/uL (ref 4.0–10.5)
nRBC: 0.2 % (ref 0.0–0.2)

## 2018-12-08 MED ORDER — SODIUM CHLORIDE 0.9 % IV SOLN
1920.0000 mg/m2 | INTRAVENOUS | Status: DC
  Administered 2018-12-08: 3950 mg via INTRAVENOUS
  Filled 2018-12-08: qty 79

## 2018-12-08 MED ORDER — FLUOROURACIL CHEMO INJECTION 2.5 GM/50ML
320.0000 mg/m2 | Freq: Once | INTRAVENOUS | Status: AC
  Administered 2018-12-08: 650 mg via INTRAVENOUS
  Filled 2018-12-08: qty 13

## 2018-12-08 MED ORDER — SODIUM CHLORIDE 0.9 % IV SOLN
Freq: Once | INTRAVENOUS | Status: AC
  Administered 2018-12-08: 11:00:00 via INTRAVENOUS
  Filled 2018-12-08: qty 5

## 2018-12-08 MED ORDER — SODIUM CHLORIDE 0.9 % IV SOLN
Freq: Once | INTRAVENOUS | Status: AC
  Administered 2018-12-08: 10:00:00 via INTRAVENOUS

## 2018-12-08 MED ORDER — SODIUM CHLORIDE 0.9 % IV SOLN
5.0000 mg/kg | Freq: Once | INTRAVENOUS | Status: AC
  Administered 2018-12-08: 400 mg via INTRAVENOUS
  Filled 2018-12-08: qty 16

## 2018-12-08 MED ORDER — BEVACIZUMAB-BVZR CHEMO INJECTION 400 MG/16ML
5.0000 mg/kg | Freq: Once | INTRAVENOUS | Status: DC
  Filled 2018-12-08: qty 16

## 2018-12-08 MED ORDER — SODIUM CHLORIDE 0.9 % IV SOLN
700.0000 mg | Freq: Once | INTRAVENOUS | Status: AC
  Administered 2018-12-08: 700 mg via INTRAVENOUS
  Filled 2018-12-08: qty 35

## 2018-12-08 MED ORDER — SODIUM CHLORIDE 0.9 % IV SOLN
10.0000 mg | Freq: Once | INTRAVENOUS | Status: AC
  Administered 2018-12-08: 10 mg via INTRAVENOUS
  Filled 2018-12-08: qty 10

## 2018-12-08 MED ORDER — PALONOSETRON HCL INJECTION 0.25 MG/5ML
0.2500 mg | Freq: Once | INTRAVENOUS | Status: AC
  Administered 2018-12-08: 0.25 mg via INTRAVENOUS
  Filled 2018-12-08: qty 5

## 2018-12-08 NOTE — Assessment & Plan Note (Addendum)
1.  Stage IV colon cancer (pT3 pN1 M1) MSI high: - K-ras mutation negative, BRAF V600 E+, PIK3CA, TP53, CDKN2A -12 cycles of FOLFOX from 08/26/2016 through 01/27/2017 -Opdivo from 02/23/2017 through 07/13/2017 -FOLFIRI and bevacizumab from 07/27/2017 through 10/13/2018 - CT CAP from 11/08/2018 showed stable disease and abdomen and pelvis.  Mild progression of linear reticulonodular opacities in the lungs likely atypical infectious process. - 5-FU and bevacizumab maintenance started on 10/27/2018. - He is continuing to tolerate maintenance very well.  He gained 3 pounds since last visit.  He is also drinking boost 2 cans/day. -We reviewed his labs.  He will proceed with his treatment today.  He will be seen back in 2 weeks.  2.  Peripheral neuropathy: -He reportedly stopped taking gabapentin.  His tingling and numbness has gotten better.  3.  Diarrhea: -He takes Imodium and Lomotil as needed.  4.  Weight loss: - He gained 3 pounds.  He is drinking boost 2 cans/day.  Eating 3 meals per day.  He is continuing to gain weight since Irinotecan discontinued.

## 2018-12-08 NOTE — Progress Notes (Signed)
Patient to treatment area for oncology follow up and treatment.  Seen by Dr. Delton Coombes with lab review and ok to treat today verbal order.   Patient tolerated chemotherapy with no complaints voiced.  Port site clean and dry with no bruising or swelling noted at site.  Good blood return noted before and after administration of chemotherapy.  Chemo pump connected with no alarms noted.  Dressing intact.  Patient left ambulatory with VSS and no s/s of distress noted.

## 2018-12-08 NOTE — Progress Notes (Signed)
[place], [place] [id number]   CLINIC:  Medical Oncology/Hematology  PCP:  Tobe Sos, MD [address] 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[place])   07/27/2017 -  Chemotherapy   The patient had palonosetron (ALOXI) injection 0.25 mg, 0.25 mg, Intravenous,  Once, 36 of 38 cycles Administration: 0.25 mg (07/27/2017), 0.25 mg (08/10/2017), 0.25 mg (08/24/2017), 0.25 mg (09/07/2017), 0.25 mg (09/21/2017), 0.25 mg (10/05/2017), 0.25 mg (10/19/2017), 0.25 mg (11/02/2017), 0.25 mg (11/16/2017), 0.25 mg (11/30/2017), 0.25 mg (12/14/2017), 0.25 mg (12/28/2017), 0.25 mg (01/12/2018), 0.25 mg (01/26/2018), 0.25 mg (02/09/2018), 0.25 mg (02/24/2018), 0.25 mg (03/10/2018), 0.25 mg (03/29/2018), 0.25 mg (04/12/2018), 0.25 mg (04/26/2018), 0.25 mg (05/10/2018), 0.25 mg (05/24/2018), 0.25 mg (06/07/2018), 0.25 mg (06/21/2018), 0.25 mg (07/05/2018), 0.25 mg (07/19/2018), 0.25 mg (08/03/2018), 0.25 mg (08/18/2018), 0.25 mg (09/01/2018), 0.25 mg (09/15/2018), 0.25 mg (09/29/2018), 0.25 mg (10/13/2018), 0.25 mg (10/27/2018), 0.25 mg (11/10/2018), 0.25 mg (11/24/2018), 0.25 mg (12/08/2018) pegfilgrastim-cbqv (UDENYCA) injection 6 mg, 6 mg, Subcutaneous, Once, 7 of 7 cycles Administration: 6 mg (10/07/2017), 6 mg (10/21/2017), 6 mg (11/04/2017), 6 mg (11/18/2017), 6 mg (12/02/2017), 6 mg (12/16/2017), 6 mg (12/30/2017) bevacizumab-bvzr (ZIRABEV) chemo injection 400 mg, 5 mg/kg = 400 mg (100 % of original dose 5 mg/kg), Intravenous,  Once, 2 of 4 cycles Dose modification: 5 mg/kg (original dose 5 mg/kg, Cycle 35) bevacizumab (AVASTIN) 450 mg in sodium chloride 0.9 % 100 mL chemo infusion, 5 mg/kg = 450 mg, Intravenous,  Once, 35 of 35 cycles Dose modification: 4 mg/kg  (80 % of original dose 5 mg/kg, Cycle 5, Reason: Provider Judgment), 5 mg/kg (original dose 5 mg/kg, Cycle 16, Reason: Other (see comments)), 5 mg/kg (original dose 5 mg/kg, Cycle 35) Administration: 450 mg (07/27/2017), 450 mg (08/10/2017), 450 mg (08/24/2017), 450 mg (09/07/2017), 450 mg (09/21/2017), 450 mg (10/05/2017), 450 mg (10/19/2017), 450 mg (11/02/2017), 450 mg (11/16/2017), 450 mg (11/30/2017), 450 mg (12/14/2017), 450 mg (12/28/2017), 400 mg (01/12/2018), 400 mg (01/26/2018), 400 mg (02/09/2018), 400 mg (02/24/2018), 400 mg (03/10/2018), 400 mg (03/29/2018), 400 mg (04/12/2018), 400 mg (04/26/2018), 400 mg (05/10/2018), 400 mg (05/24/2018), 400 mg (06/07/2018), 400 mg (06/21/2018), 400 mg (07/05/2018), 400 mg (07/19/2018), 400 mg (08/03/2018), 400 mg (08/18/2018), 400 mg (09/01/2018), 400 mg (09/15/2018), 400 mg (09/29/2018), 400 mg (10/13/2018), 400 mg (10/27/2018), 400 mg (11/10/2018), 400 mg (11/24/2018) irinotecan (CAMPTOSAR) 400 mg in dextrose 5 % 500 mL chemo infusion, 180 mg/m2 = 400 mg, Intravenous,  Once, 32 of 32 cycles Dose modification: 144 mg/m2 (80 % of original dose 180 mg/m2, Cycle 5, Reason: Provider Judgment), 135 mg/m2 (75 % of original dose 180 mg/m2, Cycle 30, Reason: Other (see comments), Comment: diarrhea) Administration: 400 mg (07/27/2017), 400 mg (08/10/2017), 400 mg (08/24/2017), 400 mg (09/07/2017), 320 mg (09/21/2017), 320 mg (10/05/2017), 320 mg (10/19/2017), 320 mg (11/02/2017), 320 mg (11/16/2017), 320 mg (11/30/2017), 320 mg (12/14/2017), 320 mg (12/28/2017), 320 mg (01/12/2018), 320 mg (01/26/2018), 320 mg (02/09/2018), 300 mg (02/24/2018), 300 mg (03/10/2018), 300 mg (03/29/2018), 300 mg (04/12/2018), 300 mg (04/26/2018), 300 mg (05/10/2018), 300 mg (05/24/2018), 300 mg (06/07/2018), 300 mg (06/21/2018), 300 mg (07/05/2018), 300 mg (07/19/2018), 300 mg (08/03/2018), 300 mg (08/18/2018), 300 mg (09/01/2018), 280 mg (09/15/2018), 280 mg (09/29/2018), 280 mg (10/13/2018) leucovorin  800 mg in dextrose 5 % 250 mL infusion, 872 mg, Intravenous,   Once, 36 of 38 cycles Dose modification: 320 mg/m2 (80 % of original dose 400 mg/m2, Cycle 5, Reason: Provider Judgment) Administration: 800 mg (07/27/2017), 800 mg (08/10/2017), 800 mg (08/24/2017), 800 mg (09/07/2017), 700 mg (09/21/2017), 700 mg (10/05/2017), 700 mg (10/19/2017), 700 mg (11/02/2017), 700 mg (11/16/2017), 700 mg (11/30/2017), 700 mg (12/14/2017), 700 mg (12/28/2017), 700 mg (01/12/2018), 700 mg (01/26/2018), 700 mg (02/09/2018), 700 mg (02/24/2018), 700 mg (03/10/2018), 700 mg (03/29/2018), 700 mg (04/12/2018), 700 mg (04/26/2018), 700 mg (05/10/2018), 700 mg (05/24/2018), 700 mg (06/07/2018), 700 mg (06/21/2018), 700 mg (07/05/2018), 700 mg (07/19/2018), 700 mg (08/03/2018), 700 mg (08/18/2018), 700 mg (09/01/2018), 700 mg (09/15/2018), 700 mg (09/29/2018), 700 mg (10/13/2018), 700 mg (10/27/2018), 700 mg (11/10/2018), 700 mg (11/24/2018), 700 mg (12/08/2018) fluorouracil (ADRUCIL) chemo injection 850 mg, 400 mg/m2 = 850 mg, Intravenous,  Once, 36 of 38 cycles Dose modification: 320 mg/m2 (80 % of original dose 400 mg/m2, Cycle 5, Reason: Provider Judgment) Administration: 850 mg (07/27/2017), 850 mg (08/10/2017), 850 mg (08/24/2017), 850 mg (09/07/2017), 700 mg (09/21/2017), 700 mg (10/05/2017), 700 mg (10/19/2017), 700 mg (11/02/2017), 700 mg (11/16/2017), 700 mg (11/30/2017), 700 mg (12/14/2017), 700 mg (12/28/2017), 700 mg (01/12/2018), 700 mg (01/26/2018), 700 mg (02/09/2018), 650 mg (02/24/2018), 650 mg (03/10/2018), 650 mg (03/29/2018), 650 mg (04/12/2018), 650 mg (04/26/2018), 650 mg (05/10/2018), 650 mg (05/24/2018), 650 mg (06/07/2018), 650 mg (06/21/2018), 650 mg (07/05/2018), 650 mg (07/19/2018), 650 mg (08/03/2018), 650 mg (08/18/2018), 650 mg (09/01/2018), 650 mg (09/15/2018), 650 mg (09/29/2018), 650 mg (10/13/2018), 650 mg (10/27/2018), 650 mg (11/10/2018), 650 mg (11/24/2018), 650 mg (12/08/2018) fosaprepitant (EMEND) 150 mg in sodium chloride 0.9 % 145 mL IVPB, , Intravenous,  Once, 2 of 4 cycles Administration:  (11/24/2018),  (12/08/2018) fluorouracil  (ADRUCIL) 5,250 mg in sodium chloride 0.9 % 145 mL chemo infusion, 2,400 mg/m2 = 5,250 mg, Intravenous, 1 Day/Dose, 36 of 38 cycles Dose modification: 1,920 mg/m2 (80 % of original dose 2,400 mg/m2, Cycle 5, Reason: Provider Judgment) Administration: 5,250 mg (07/27/2017), 5,250 mg (08/10/2017), 5,250 mg (08/24/2017), 5,250 mg (09/07/2017), 4,200 mg (09/21/2017), 4,200 mg (10/05/2017), 4,200 mg (10/19/2017), 4,200 mg (11/02/2017), 4,200 mg (11/16/2017), 4,200 mg (11/30/2017), 4,200 mg (12/14/2017), 4,200 mg (12/28/2017), 4,200 mg (01/12/2018), 4,200 mg (01/26/2018), 4,200 mg (02/09/2018), 3,950 mg (02/24/2018), 3,950 mg (03/10/2018), 3,950 mg (03/29/2018), 3,950 mg (04/12/2018), 3,950 mg (04/26/2018), 3,950 mg (05/10/2018), 3,950 mg (05/24/2018), 3,950 mg (06/07/2018), 3,950 mg (06/21/2018), 3,950 mg (07/05/2018), 3,950 mg (07/19/2018), 3,950 mg (08/03/2018), 3,950 mg (08/18/2018), 3,950 mg (09/01/2018), 3,950 mg (09/15/2018), 3,950 mg (09/29/2018), 3,950 mg (10/13/2018), 3,950 mg (10/27/2018), 3,950 mg (11/10/2018), 3,950 mg (11/24/2018), 3,950 mg (12/08/2018) bevacizumab-bvzr (ZIRABEV) 400 mg in sodium chloride 0.9 % 100 mL chemo infusion, 5 mg/kg = 400 mg (100 % of original dose 5 mg/kg), Intravenous,  Once, 1 of 3 cycles Dose modification: 5 mg/kg (original dose 5 mg/kg, Cycle 36) Administration: 400 mg (12/08/2018)  for chemotherapy treatment.       CANCER STAGING: Cancer Staging Malignant neoplasm of transverse colon [facility]) Staging form: Colon and Rectum, AJCC 8th Edition - Clinical: cT3, cN1a, cM1 - Signed by Twana First, MD on 03/09/2017 - Pathologic: No stage assigned - Unsigned    INTERVAL HISTORY:  Mr. Meulemans 85 y.o. male seen for follow-up of stage IV colon cancer.  He is receiving maintenance therapy with 5-FU and bevacizumab.  He had diarrhea for 1 day which was controlled with Imodium.  His  appetite is 100%.  Energy levels are 50%.  No pain is reported.  He has gained 3 pounds since last visit.  He is not taking gabapentin  and feels that his neuropathy is better without it.  He is drinking boost 2 cans/day.  Denies any nausea, vomiting, constipation.  REVIEW OF SYSTEMS:  Review of Systems  Gastrointestinal: Positive for diarrhea.  Neurological: Positive for dizziness and numbness.  All other systems reviewed and are negative.    PAST MEDICAL/SURGICAL HISTORY:  Past Medical History:  Diagnosis Date  . Chronic pulmonary embolism ([place]) 81/8299   compliction of the valve replacement surgery  . COPD (chronic obstructive pulmonary disease) ([place])   . Diabetes ([place]) 04/22/2016  . H/O aortic valve replacement 10/2008  . Hypercholesteremia   . Hypertension   . Pulmonary emboli ([place]) 10/2008  . Sleep apnea    Past Surgical History:  Procedure Laterality Date  . AORTIC VALVE REPLACEMENT     2010  . CARDIAC CATHETERIZATION  2010  . COLONOSCOPY N/A 06/05/2016   Procedure: COLONOSCOPY;  Surgeon: Rogene Houston, MD;  Location: [facility] ENDO SUITE;  Service: Endoscopy;  Laterality: N/A;  . PARTIAL COLECTOMY N/A 07/11/2016   Procedure: PARTIAL COLECTOMY;  Surgeon: Aviva Signs, MD;  Location: [facility] ORS;  Service: General;  Laterality: N/A;  . PORTACATH PLACEMENT N/A 08/13/2016   Procedure: INSERTION PORT-A-CATH LEFT SUBCLAVIAN;  Surgeon: Aviva Signs, MD;  Location: [facility] ORS;  Service: General;  Laterality: N/A;  . REPLACEMENT TOTAL KNEE     1996  . spenectomy     from trauma     SOCIAL HISTORY:  Social History   Socioeconomic History  . Marital status: Widowed    Spouse name: Not on file  . Number of children: Not on file  . Years of education: Not on file  . Highest education level: Not on file  Occupational History  . Not on file  Social Needs  . Financial resource strain: Not on file  . Food insecurity    Worry: Not on file    Inability: Not on file  . Transportation needs    Medical: Not on file    Non-medical: Not on file  Tobacco Use  . Smoking status: Former Smoker    Years: 15.00    Types:  Cigarettes    Quit date: 1964    Years since quitting: 56.7  . Smokeless tobacco: Never Used  Substance and Sexual Activity  . Alcohol use: No  . Drug use: No  . Sexual activity: Yes  Lifestyle  . Physical activity    Days per week: Not on file    Minutes per session: Not on file  . Stress: Not on file  Relationships  . Social Herbalist on phone: Not on file    Gets together: Not on file    Attends religious service: Not on file    Active member of club or organization: Not on file    Attends meetings of clubs or organizations: Not on file    Relationship status: Not on file  . Intimate partner violence    Fear of current or ex partner: Not on file    Emotionally abused: Not on file    Physically abused: Not on file    Forced sexual activity: Not on file  Other Topics Concern  . Not on file  Social History Narrative  . Not on file    FAMILY HISTORY:  Family History  Problem Relation  Age of Onset  . Colon cancer Neg Hx     CURRENT MEDICATIONS:  Outpatient Encounter Medications as of 12/08/2018  Medication Sig Note  . atorvastatin (LIPITOR) 10 MG tablet Take 10 mg by mouth at bedtime.    . Cholecalciferol (VITAMIN D3) 5000 units CAPS Take 5,000 Units by mouth daily.    Marland Kitchen lisinopril (PRINIVIL,ZESTRIL) 10 MG tablet Take 10 mg by mouth daily.   . metoprolol (LOPRESSOR) 50 MG tablet Take 50 mg by mouth 2 (two) times daily.   . Multiple Vitamins-Minerals (CENTRUM SILVER 50+MEN PO) Take 1 tablet by mouth daily.   Marland Kitchen RELION GLUCOSE TEST STRIPS test strip USE 1 STRIP TO CHECK GLUCOSE ONCE DAILY   . vitamin B-12 (CYANOCOBALAMIN) 1000 MCG tablet Take 1,000 mcg by mouth daily.    . diphenoxylate-atropine (LOMOTIL) 2.5-0.025 MG tablet Take 1 tablet by mouth 4 (four) times daily as needed for diarrhea or loose stools. (Patient not taking: Reported on 12/08/2018)   . lidocaine-prilocaine (EMLA) cream Apply 1 application topically as needed. (Patient not taking: Reported on  12/08/2018)   . ondansetron (ZOFRAN ODT) 8 MG disintegrating tablet Take 1 tablet (8 mg total) by mouth every 8 (eight) hours as needed for nausea or vomiting. (Patient not taking: Reported on 12/08/2018)   . [DISCONTINUED] gabapentin (NEURONTIN) 600 MG tablet Take 1 tablet (600 mg total) by mouth 2 (two) times daily.   . [DISCONTINUED] NOVOLIN R FLEXPEN RELION 100 UNIT/ML SOPN INJECT 5 UNITS ONCE DAILY FOR GLUCOSE 150   . [DISCONTINUED] prochlorperazine (COMPAZINE) 10 MG tablet Take 1 tablet (10 mg total) by mouth every 6 (six) hours as needed (Nausea or vomiting). 08/05/2016: New Med   Facility-Administered Encounter Medications as of 12/08/2018  Medication  . sodium chloride flush (NS) 0.9 % injection 10 mL    ALLERGIES:  Allergies  Allergen Reactions  . Amoxicillin Hives    Has patient had a PCN reaction causing immediate rash, facial/tongue/throat swelling, SOB or lightheadedness with hypotension: No Has patient had a PCN reaction causing severe rash involving mucus membranes or skin necrosis: Yes Has patient had a PCN reaction that required hospitalization No Has patient had a PCN reaction occurring within the last 10 years: No If all of the above answers are "NO", then may proceed with Cephalosporin use.   . Tamsulosin Hcl     Cant sleep, confusion      PHYSICAL EXAM:  ECOG Performance status: 1  Vitals:   12/08/18 0905  BP: (!) 167/70  Pulse: 74  Resp: 18  Temp: (!) 97 F (36.1 C)  SpO2: 99%   Filed Weights   12/08/18 0905  Weight: 166 lb (75.3 kg)    Physical Exam Vitals signs reviewed.  Constitutional:      Appearance: Normal appearance.  HENT:     Head: Normocephalic.     Nose: Nose normal.     Mouth/Throat:     Mouth: Mucous membranes are moist.     Pharynx: Oropharynx is clear.  Eyes:     Extraocular Movements: Extraocular movements intact.     Conjunctiva/sclera: Conjunctivae normal.  Neck:     Musculoskeletal: Normal range of motion.   Cardiovascular:     Rate and Rhythm: Normal rate and regular rhythm.     Pulses: Normal pulses.     Heart sounds: Normal heart sounds.  Pulmonary:     Effort: Pulmonary effort is normal.     Breath sounds: Normal breath sounds.  Abdominal:  General: Bowel sounds are normal.  Musculoskeletal: Normal range of motion.  Skin:    General: Skin is warm and dry.  Neurological:     General: No focal deficit present.     Mental Status: He is alert and oriented to person, place, and time.  Psychiatric:        Mood and Affect: Mood normal.        Behavior: Behavior normal.        Thought Content: Thought content normal.        Judgment: Judgment normal.      LABORATORY DATA:  I have reviewed the labs as listed.  CBC    Component Value Date/Time   WBC 8.6 12/08/2018 0859   RBC 3.34 (L) 12/08/2018 0859   HGB 11.7 (L) 12/08/2018 0859   HCT 36.9 (L) 12/08/2018 0859   PLT 160 12/08/2018 0859   MCV 110.5 (H) 12/08/2018 0859   MCH 35.0 (H) 12/08/2018 0859   MCHC 31.7 12/08/2018 0859   RDW 15.3 12/08/2018 0859   LYMPHSABS 2.4 12/08/2018 0859   MONOABS 1.1 (H) 12/08/2018 0859   EOSABS 0.3 12/08/2018 0859   BASOSABS 0.0 12/08/2018 0859   CMP Latest Ref Rng & Units 12/08/2018 11/24/2018 11/11/2018  Glucose 70 - 99 mg/dL 149(H) 160(H) 209(H)  BUN 8 - 23 mg/dL '15 15 23  ' Creatinine 0.61 - 1.24 mg/dL 0.92 0.92 0.92  Sodium 135 - 145 mmol/L 139 137 141  Potassium 3.5 - 5.1 mmol/L 3.9 4.0 3.8  Chloride 98 - 111 mmol/L 107 107 107  CO2 22 - 32 mmol/L 24 24 21(L)  Calcium 8.9 - 10.3 mg/dL 8.8(L) 8.8(L) 9.4  Total Protein 6.5 - 8.1 g/dL 6.4(L) 6.6 7.2  Total Bilirubin 0.3 - 1.2 mg/dL 0.9 1.2 1.3(H)  Alkaline Phos 38 - 126 U/L 45 39 46  AST 15 - 41 U/L '31 30 31  ' ALT 0 - 44 U/L '24 21 28     ' I have independently reviewed his scans and discussed with the patient.   ASSESSMENT & PLAN:   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[place]) 1.  Stage IV colon cancer (pT3 pN1 M1) MSI high: -  K-ras mutation negative, BRAF V600 E+, PIK3CA, TP53, CDKN2A -12 cycles of FOLFOX from 08/26/2016 through 01/27/2017 -Opdivo from 02/23/2017 through 07/13/2017 -FOLFIRI and bevacizumab from 07/27/2017 through 10/13/2018 - CT CAP from 11/08/2018 showed stable disease and abdomen and pelvis.  Mild progression of linear reticulonodular opacities in the lungs likely atypical infectious process. - 5-FU and bevacizumab maintenance started on 10/27/2018. - He is continuing to tolerate maintenance very well.  He gained 3 pounds since last visit.  He is also drinking boost 2 cans/day. -We reviewed his labs.  He will proceed with his treatment today.  He will be seen back in 2 weeks.  2.  Peripheral neuropathy: -He reportedly stopped taking gabapentin.  His tingling and numbness has gotten better.  3.  Diarrhea: -He takes Imodium and Lomotil as needed.  4.  Weight loss: - He gained 3 pounds.  He is drinking boost 2 cans/day.  Eating 3 meals per day.  He is continuing to gain weight since Irinotecan discontinued.   Total time spent is 25 minutes with more than 50% of the time spent discussing treatment plan changes, counseling and coordination of care.  Orders placed this encounter:  No orders of the defined types were placed in this encounter.     [place] [phone]

## 2018-12-08 NOTE — Patient Instructions (Signed)
[facility] at [facility] Discharge Instructions  You were seen today by Dr. Katragadda. He went over your recent lab results. He will see you back in 2 weeks for labs, treatment and follow up.   Thank you for choosing [facility] at [facility] to provide your oncology and hematology care.  To afford each patient quality time with our provider, please arrive at least 15 minutes before your scheduled appointment time.   If you have a lab appointment with the Cancer Center please come in thru the  Main Entrance and check in at the main information desk  You need to re-schedule your appointment should you arrive 10 or more minutes late.  We strive to give you quality time with our providers, and arriving late affects you and other patients whose appointments are after yours.  Also, if you no show three or more times for appointments you may be dismissed from the clinic at the providers discretion.     Again, thank you for choosing [facility].  Our hope is that these requests will decrease the amount of time that you wait before being seen by our physicians.       _____________________________________________________________  Should you have questions after your visit to [facility], please contact our office at [phone] between the hours of 8:00 a.m. and 4:30 p.m.  Voicemails left after 4:00 p.m. will not be returned until the following business day.  For prescription refill requests, have your pharmacy contact our office and allow 72 hours.    Cancer Center Support Programs:   > Cancer Support Group  2nd Tuesday of the month 1pm-2pm, Journey Room    

## 2018-12-10 ENCOUNTER — Inpatient Hospital Stay (HOSPITAL_COMMUNITY): Payer: Medicare Other

## 2018-12-10 ENCOUNTER — Encounter (HOSPITAL_COMMUNITY): Payer: Self-pay

## 2018-12-10 ENCOUNTER — Other Ambulatory Visit: Payer: Self-pay

## 2018-12-10 VITALS — BP 124/49 | HR 50 | Temp 96.9°F | Resp 18

## 2018-12-10 DIAGNOSIS — Z5111 Encounter for antineoplastic chemotherapy: Secondary | ICD-10-CM | POA: Diagnosis not present

## 2018-12-10 DIAGNOSIS — Z95828 Presence of other vascular implants and grafts: Secondary | ICD-10-CM

## 2018-12-10 DIAGNOSIS — C184 Malignant neoplasm of transverse colon: Secondary | ICD-10-CM

## 2018-12-10 MED ORDER — HEPARIN SOD (PORK) LOCK FLUSH 100 UNIT/ML IV SOLN
500.0000 [IU] | Freq: Once | INTRAVENOUS | Status: AC | PRN
  Administered 2018-12-10: 500 [IU]

## 2018-12-10 MED ORDER — SODIUM CHLORIDE 0.9% FLUSH
10.0000 mL | INTRAVENOUS | Status: DC | PRN
  Administered 2018-12-10: 10 mL
  Filled 2018-12-10: qty 10

## 2018-12-22 ENCOUNTER — Inpatient Hospital Stay (HOSPITAL_COMMUNITY): Payer: Medicare Other

## 2018-12-22 ENCOUNTER — Inpatient Hospital Stay (HOSPITAL_BASED_OUTPATIENT_CLINIC_OR_DEPARTMENT_OTHER): Payer: Medicare Other | Admitting: Hematology

## 2018-12-22 ENCOUNTER — Other Ambulatory Visit: Payer: Self-pay

## 2018-12-22 ENCOUNTER — Encounter (HOSPITAL_COMMUNITY): Payer: Self-pay | Admitting: Hematology

## 2018-12-22 VITALS — BP 92/61 | HR 88 | Temp 97.9°F | Resp 18 | Wt 161.4 lb

## 2018-12-22 VITALS — BP 124/81 | HR 60 | Temp 96.9°F | Resp 18

## 2018-12-22 DIAGNOSIS — Z95828 Presence of other vascular implants and grafts: Secondary | ICD-10-CM

## 2018-12-22 DIAGNOSIS — C184 Malignant neoplasm of transverse colon: Secondary | ICD-10-CM

## 2018-12-22 DIAGNOSIS — Z5111 Encounter for antineoplastic chemotherapy: Secondary | ICD-10-CM | POA: Diagnosis not present

## 2018-12-22 LAB — URINALYSIS, DIPSTICK ONLY
Bilirubin Urine: NEGATIVE
Glucose, UA: NEGATIVE mg/dL
Hgb urine dipstick: NEGATIVE
Ketones, ur: NEGATIVE mg/dL
Nitrite: NEGATIVE
Protein, ur: NEGATIVE mg/dL
Specific Gravity, Urine: 1.021 (ref 1.005–1.030)
pH: 5 (ref 5.0–8.0)

## 2018-12-22 LAB — CBC WITH DIFFERENTIAL/PLATELET
Abs Immature Granulocytes: 0.03 10*3/uL (ref 0.00–0.07)
Basophils Absolute: 0.1 10*3/uL (ref 0.0–0.1)
Basophils Relative: 1 %
Eosinophils Absolute: 0.3 10*3/uL (ref 0.0–0.5)
Eosinophils Relative: 3 %
HCT: 41.6 % (ref 39.0–52.0)
Hemoglobin: 13.1 g/dL (ref 13.0–17.0)
Immature Granulocytes: 0 %
Lymphocytes Relative: 28 %
Lymphs Abs: 2.8 10*3/uL (ref 0.7–4.0)
MCH: 34.6 pg — ABNORMAL HIGH (ref 26.0–34.0)
MCHC: 31.5 g/dL (ref 30.0–36.0)
MCV: 109.8 fL — ABNORMAL HIGH (ref 80.0–100.0)
Monocytes Absolute: 1.3 10*3/uL — ABNORMAL HIGH (ref 0.1–1.0)
Monocytes Relative: 13 %
Neutro Abs: 5.5 10*3/uL (ref 1.7–7.7)
Neutrophils Relative %: 55 %
Platelets: 144 10*3/uL — ABNORMAL LOW (ref 150–400)
RBC: 3.79 MIL/uL — ABNORMAL LOW (ref 4.22–5.81)
RDW: 14.7 % (ref 11.5–15.5)
WBC: 10 10*3/uL (ref 4.0–10.5)
nRBC: 0 % (ref 0.0–0.2)

## 2018-12-22 LAB — COMPREHENSIVE METABOLIC PANEL
ALT: 27 U/L (ref 0–44)
AST: 29 U/L (ref 15–41)
Albumin: 3.8 g/dL (ref 3.5–5.0)
Alkaline Phosphatase: 52 U/L (ref 38–126)
Anion gap: 10 (ref 5–15)
BUN: 17 mg/dL (ref 8–23)
CO2: 25 mmol/L (ref 22–32)
Calcium: 9.3 mg/dL (ref 8.9–10.3)
Chloride: 103 mmol/L (ref 98–111)
Creatinine, Ser: 0.87 mg/dL (ref 0.61–1.24)
GFR calc Af Amer: >60 mL/min (ref >60)
GFR calc non Af Amer: >60 mL/min (ref >60)
Glucose, Bld: 148 mg/dL — ABNORMAL HIGH (ref 70–99)
Potassium: 4.4 mmol/L (ref 3.5–5.1)
Sodium: 138 mmol/L (ref 135–145)
Total Bilirubin: 0.8 mg/dL (ref 0.3–1.2)
Total Protein: 6.8 g/dL (ref 6.5–8.1)

## 2018-12-22 MED ORDER — SODIUM CHLORIDE 0.9 % IV SOLN
700.0000 mg | Freq: Once | INTRAVENOUS | Status: AC
  Administered 2018-12-22: 700 mg via INTRAVENOUS
  Filled 2018-12-22: qty 35

## 2018-12-22 MED ORDER — SODIUM CHLORIDE 0.9 % IV SOLN
10.0000 mg | Freq: Once | INTRAVENOUS | Status: AC
  Administered 2018-12-22: 10 mg via INTRAVENOUS
  Filled 2018-12-22: qty 10

## 2018-12-22 MED ORDER — PALONOSETRON HCL INJECTION 0.25 MG/5ML
0.2500 mg | Freq: Once | INTRAVENOUS | Status: AC
  Administered 2018-12-22: 0.25 mg via INTRAVENOUS
  Filled 2018-12-22: qty 5

## 2018-12-22 MED ORDER — FLUOROURACIL CHEMO INJECTION 2.5 GM/50ML
320.0000 mg/m2 | Freq: Once | INTRAVENOUS | Status: AC
  Administered 2018-12-22: 650 mg via INTRAVENOUS
  Filled 2018-12-22: qty 13

## 2018-12-22 MED ORDER — SODIUM CHLORIDE 0.9 % IV SOLN
5.0000 mg/kg | Freq: Once | INTRAVENOUS | Status: AC
  Administered 2018-12-22: 400 mg via INTRAVENOUS
  Filled 2018-12-22: qty 16

## 2018-12-22 MED ORDER — SODIUM CHLORIDE 0.9 % IV SOLN
Freq: Once | INTRAVENOUS | Status: AC
  Administered 2018-12-22: 11:00:00 via INTRAVENOUS
  Filled 2018-12-22: qty 5

## 2018-12-22 MED ORDER — SODIUM CHLORIDE 0.9 % IV SOLN
Freq: Once | INTRAVENOUS | Status: AC
  Administered 2018-12-22: 10:00:00 via INTRAVENOUS

## 2018-12-22 MED ORDER — SODIUM CHLORIDE 0.9 % IV SOLN
1920.0000 mg/m2 | INTRAVENOUS | Status: DC
  Administered 2018-12-22: 3950 mg via INTRAVENOUS
  Filled 2018-12-22: qty 79

## 2018-12-22 NOTE — Progress Notes (Signed)
Pt presents today for treatment and f/u appt with Dr. Delton Coombes. MAR reviewed. Pt has no complaints of any changes since the last tx. Pt has no complaints of any pain today.   Message received from [facility] LPN. Proceed with tx.   Treatment given today per MD orders. Tolerated infusion without adverse affects. Vital signs stable. No complaints at this time. Discharged from clinic ambulatory. F/U with [facility] as scheduled.

## 2018-12-22 NOTE — Assessment & Plan Note (Signed)
1.  Stage IV colon cancer (pT3 pN1 M1) MSI high: - K-ras mutation negative, BRAF V600 E+, PIK3CA, TP53, CDKN2A -12 cycles of FOLFOX from 08/26/2016 through 01/27/2017 -Opdivo from 02/23/2017 through 07/13/2017 -FOLFIRI and bevacizumab from 07/27/2017 through 10/13/2018 - CT CAP from 11/08/2018 showed stable disease and abdomen and pelvis.  Mild progression of linear reticulonodular opacities in the lungs likely atypical infectious process. - 5-FU and bevacizumab maintenance started on 10/27/2018. -Labs are acceptable to proceed with treatment today. - He will return to clinic in two weeks.    2.  Peripheral neuropathy: -He reportedly stopped taking gabapentin.  His tingling and numbness has gotten better.  3.  Diarrhea: -Continue  Imodium and Lomotil as needed.  4.  Weight loss: - Continue suplementing with boost 2 cans/day.

## 2018-12-22 NOTE — Patient Instructions (Signed)
[facility] at [facility] Discharge Instructions  You were seen today by Dr. Katragadda. He went over your recent lab results. He will see you back in 2 weeks for labs and follow up.   Thank you for choosing [facility] at [facility] to provide your oncology and hematology care.  To afford each patient quality time with our provider, please arrive at least 15 minutes before your scheduled appointment time.   If you have a lab appointment with the Cancer Center please come in thru the  Main Entrance and check in at the main information desk  You need to re-schedule your appointment should you arrive 10 or more minutes late.  We strive to give you quality time with our providers, and arriving late affects you and other patients whose appointments are after yours.  Also, if you no show three or more times for appointments you may be dismissed from the clinic at the providers discretion.     Again, thank you for choosing [facility].  Our hope is that these requests will decrease the amount of time that you wait before being seen by our physicians.       _____________________________________________________________  Should you have questions after your visit to [facility], please contact our office at [phone] between the hours of 8:00 a.m. and 4:30 p.m.  Voicemails left after 4:00 p.m. will not be returned until the following business day.  For prescription refill requests, have your pharmacy contact our office and allow 72 hours.    Cancer Center Support Programs:   > Cancer Support Group  2nd Tuesday of the month 1pm-2pm, Journey Room    

## 2018-12-22 NOTE — Progress Notes (Signed)
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25 mg (05/10/2018), 0.25 mg (05/24/2018), 0.25 mg (06/07/2018), 0.25 mg (06/21/2018), 0.25 mg (07/05/2018), 0.25 mg (07/19/2018), 0.25 mg (08/03/2018), 0.25 mg (08/18/2018), 0.25 mg (09/01/2018), 0.25 mg (09/15/2018), 0.25 mg (09/29/2018), 0.25 mg (10/13/2018), 0.25 mg (10/27/2018), 0.25 mg (11/10/2018), 0.25 mg (11/24/2018), 0.25 mg (12/08/2018), 0.25 mg (12/22/2018) pegfilgrastim-cbqv (UDENYCA) injection 6 mg, 6 mg, Subcutaneous, Once, 7 of 7 cycles Administration: 6 mg (10/07/2017), 6 mg (10/21/2017), 6 mg (11/04/2017), 6 mg (11/18/2017), 6 mg (12/02/2017), 6 mg (12/16/2017), 6 mg (12/30/2017) bevacizumab-bvzr (ZIRABEV) chemo injection 400 mg, 5 mg/kg = 400 mg (100 % of original dose 5 mg/kg), Intravenous,  Once, 2 of 2 cycles Dose modification: 5 mg/kg (original dose 5 mg/kg, Cycle 35) bevacizumab (AVASTIN) 450 mg in sodium chloride 0.9 % 100 mL chemo infusion, 5 mg/kg = 450 mg, Intravenous,  Once, 35 of 35 cycles Dose  modification: 4 mg/kg (80 % of original dose 5 mg/kg, Cycle 5, Reason: Provider Judgment), 5 mg/kg (original dose 5 mg/kg, Cycle 16, Reason: Other (see comments)), 5 mg/kg (original dose 5 mg/kg, Cycle 35) Administration: 450 mg (07/27/2017), 450 mg (08/10/2017), 450 mg (08/24/2017), 450 mg (09/07/2017), 450 mg (09/21/2017), 450 mg (10/05/2017), 450 mg (10/19/2017), 450 mg (11/02/2017), 450 mg (11/16/2017), 450 mg (11/30/2017), 450 mg (12/14/2017), 450 mg (12/28/2017), 400 mg (01/12/2018), 400 mg (01/26/2018), 400 mg (02/09/2018), 400 mg (02/24/2018), 400 mg (03/10/2018), 400 mg (03/29/2018), 400 mg (04/12/2018), 400 mg (04/26/2018), 400 mg (05/10/2018), 400 mg (05/24/2018), 400 mg (06/07/2018), 400 mg (06/21/2018), 400 mg (07/05/2018), 400 mg (07/19/2018), 400 mg (08/03/2018), 400 mg (08/18/2018), 400 mg (09/01/2018), 400 mg (09/15/2018), 400 mg (09/29/2018), 400 mg (10/13/2018), 400 mg (10/27/2018), 400 mg (11/10/2018), 400 mg (11/24/2018) irinotecan (CAMPTOSAR) 400 mg in dextrose 5 % 500 mL chemo infusion, 180 mg/m2 = 400 mg, Intravenous,  Once, 32 of 32 cycles Dose modification: 144 mg/m2 (80 % of original dose 180 mg/m2, Cycle 5, Reason: Provider Judgment), 135 mg/m2 (75 % of original dose 180 mg/m2, Cycle 30, Reason: Other (see comments), Comment: diarrhea) Administration: 400 mg (07/27/2017), 400 mg (08/10/2017), 400 mg (08/24/2017), 400 mg (09/07/2017), 320 mg (09/21/2017), 320 mg (10/05/2017), 320 mg (10/19/2017), 320 mg (11/02/2017), 320 mg (11/16/2017), 320 mg (11/30/2017), 320 mg (12/14/2017), 320 mg (12/28/2017), 320 mg (01/12/2018), 320 mg (01/26/2018), 320 mg (02/09/2018), 300 mg (02/24/2018), 300 mg (03/10/2018), 300 mg (03/29/2018), 300 mg (04/12/2018), 300 mg (04/26/2018), 300 mg (05/10/2018), 300 mg (05/24/2018), 300 mg (06/07/2018), 300 mg (06/21/2018), 300 mg (07/05/2018), 300 mg (07/19/2018), 300 mg (08/03/2018), 300 mg (08/18/2018), 300 mg (09/01/2018), 280 mg (09/15/2018), 280 mg (09/29/2018), 280  mg (10/13/2018) leucovorin 800 mg in dextrose 5 % 250 mL infusion,  872 mg, Intravenous,  Once, 37 of 38 cycles Dose modification: 320 mg/m2 (80 % of original dose 400 mg/m2, Cycle 5, Reason: Provider Judgment) Administration: 800 mg (07/27/2017), 800 mg (08/10/2017), 800 mg (08/24/2017), 800 mg (09/07/2017), 700 mg (09/21/2017), 700 mg (10/05/2017), 700 mg (10/19/2017), 700 mg (11/02/2017), 700 mg (11/16/2017), 700 mg (11/30/2017), 700 mg (12/14/2017), 700 mg (12/28/2017), 700 mg (01/12/2018), 700 mg (01/26/2018), 700 mg (02/09/2018), 700 mg (02/24/2018), 700 mg (03/10/2018), 700 mg (03/29/2018), 700 mg (04/12/2018), 700 mg (04/26/2018), 700 mg (05/10/2018), 700 mg (05/24/2018), 700 mg (06/07/2018), 700 mg (06/21/2018), 700 mg (07/05/2018), 700 mg (07/19/2018), 700 mg (08/03/2018), 700 mg (08/18/2018), 700 mg (09/01/2018), 700 mg (09/15/2018), 700 mg (09/29/2018), 700 mg (10/13/2018), 700 mg (10/27/2018), 700 mg (11/10/2018), 700 mg (11/24/2018), 700 mg (12/08/2018), 700 mg (12/22/2018) fluorouracil (ADRUCIL) chemo injection 850 mg, 400 mg/m2 = 850 mg, Intravenous,  Once, 37 of 38 cycles Dose modification: 320 mg/m2 (80 % of original dose 400 mg/m2, Cycle 5, Reason: Provider Judgment) Administration: 850 mg (07/27/2017), 850 mg (08/10/2017), 850 mg (08/24/2017), 850 mg (09/07/2017), 700 mg (09/21/2017), 700 mg (10/05/2017), 700 mg (10/19/2017), 700 mg (11/02/2017), 700 mg (11/16/2017), 700 mg (11/30/2017), 700 mg (12/14/2017), 700 mg (12/28/2017), 700 mg (01/12/2018), 700 mg (01/26/2018), 700 mg (02/09/2018), 650 mg (02/24/2018), 650 mg (03/10/2018), 650 mg (03/29/2018), 650 mg (04/12/2018), 650 mg (04/26/2018), 650 mg (05/10/2018), 650 mg (05/24/2018), 650 mg (06/07/2018), 650 mg (06/21/2018), 650 mg (07/05/2018), 650 mg (07/19/2018), 650 mg (08/03/2018), 650 mg (08/18/2018), 650 mg (09/01/2018), 650 mg (09/15/2018), 650 mg (09/29/2018), 650 mg (10/13/2018), 650 mg (10/27/2018), 650 mg (11/10/2018), 650 mg (11/24/2018), 650 mg (12/08/2018), 650 mg (12/22/2018) fosaprepitant (EMEND) 150 mg in sodium chloride 0.9 % 145 mL IVPB, , Intravenous,  Once, 3 of 4 cycles  Administration:  (11/24/2018),  (12/08/2018),  (12/22/2018) fluorouracil (ADRUCIL) 5,250 mg in sodium chloride 0.9 % 145 mL chemo infusion, 2,400 mg/m2 = 5,250 mg, Intravenous, 1 Day/Dose, 37 of 38 cycles Dose modification: 1,920 mg/m2 (80 % of original dose 2,400 mg/m2, Cycle 5, Reason: Provider Judgment) Administration: 5,250 mg (07/27/2017), 5,250 mg (08/10/2017), 5,250 mg (08/24/2017), 5,250 mg (09/07/2017), 4,200 mg (09/21/2017), 4,200 mg (10/05/2017), 4,200 mg (10/19/2017), 4,200 mg (11/02/2017), 4,200 mg (11/16/2017), 4,200 mg (11/30/2017), 4,200 mg (12/14/2017), 4,200 mg (12/28/2017), 4,200 mg (01/12/2018), 4,200 mg (01/26/2018), 4,200 mg (02/09/2018), 3,950 mg (02/24/2018), 3,950 mg (03/10/2018), 3,950 mg (03/29/2018), 3,950 mg (04/12/2018), 3,950 mg (04/26/2018), 3,950 mg (05/10/2018), 3,950 mg (05/24/2018), 3,950 mg (06/07/2018), 3,950 mg (06/21/2018), 3,950 mg (07/05/2018), 3,950 mg (07/19/2018), 3,950 mg (08/03/2018), 3,950 mg (08/18/2018), 3,950 mg (09/01/2018), 3,950 mg (09/15/2018), 3,950 mg (09/29/2018), 3,950 mg (10/13/2018), 3,950 mg (10/27/2018), 3,950 mg (11/10/2018), 3,950 mg (11/24/2018), 3,950 mg (12/08/2018), 3,950 mg (12/22/2018) bevacizumab-bvzr (ZIRABEV) 400 mg in sodium chloride 0.9 % 100 mL chemo infusion, 5 mg/kg = 400 mg (100 % of original dose 5 mg/kg), Intravenous,  Once, 2 of 3 cycles Dose modification: 5 mg/kg (original dose 5 mg/kg, Cycle 36) Administration: 400 mg (12/08/2018), 400 mg (12/22/2018)  for chemotherapy treatment.       CANCER STAGING: Cancer Staging Malignant neoplasm of transverse colon [facility]) Staging form: Colon and Rectum, AJCC 8th Edition - Clinical: cT3, cN1a, cM1 - Signed by Twana First, MD on 03/09/2017 - Pathologic: No stage assigned - Unsigned    INTERVAL HISTORY:  Kevin Kirk 85 y.o. male seen for follow-up of stage IV colon cancer.  He is receiving maintenance chemotherapy every  2 weeks.  He is drinking 2 cans of boost per day.  Appetite is 100%.  Energy levels are 75%.  He lost  about 5 pounds since last visit.  Last treatment was 2 weeks ago.  He had diarrhea for 1 day after last treatment.  After that he had constipation.  Numbness in the feet has been stable.  No new pains reported.  No fevers or night sweats.  REVIEW OF SYSTEMS:  Review of Systems  Gastrointestinal: Positive for constipation and diarrhea.  Neurological: Positive for numbness.  All other systems reviewed and are negative.    PAST MEDICAL/SURGICAL HISTORY:  Past Medical History:  Diagnosis Date  . Chronic pulmonary embolism ([place]) 92/4268   compliction of the valve replacement surgery  . COPD (chronic obstructive pulmonary disease) ([place])   . Diabetes ([place]) 04/22/2016  . H/O aortic valve replacement 10/2008  . Hypercholesteremia   . Hypertension   . Pulmonary emboli ([place]) 10/2008  . Sleep apnea    Past Surgical History:  Procedure Laterality Date  . AORTIC VALVE REPLACEMENT     2010  . CARDIAC CATHETERIZATION  2010  . COLONOSCOPY N/A 06/05/2016   Procedure: COLONOSCOPY;  Surgeon: Rogene Houston, MD;  Location: [facility] ENDO SUITE;  Service: Endoscopy;  Laterality: N/A;  . PARTIAL COLECTOMY N/A 07/11/2016   Procedure: PARTIAL COLECTOMY;  Surgeon: Aviva Signs, MD;  Location: [facility] ORS;  Service: General;  Laterality: N/A;  . PORTACATH PLACEMENT N/A 08/13/2016   Procedure: INSERTION PORT-A-CATH LEFT SUBCLAVIAN;  Surgeon: Aviva Signs, MD;  Location: [facility] ORS;  Service: General;  Laterality: N/A;  . REPLACEMENT TOTAL KNEE     1996  . spenectomy     from trauma     SOCIAL HISTORY:  Social History   Socioeconomic History  . Marital status: Widowed    Spouse name: Not on file  . Number of children: Not on file  . Years of education: Not on file  . Highest education level: Not on file  Occupational History  . Not on file  Social Needs  . Financial resource strain: Not on file  . Food insecurity    Worry: Not on file    Inability: Not on file  . Transportation needs    Medical: Not on  file    Non-medical: Not on file  Tobacco Use  . Smoking status: Former Smoker    Years: 15.00    Types: Cigarettes    Quit date: 1964    Years since quitting: 56.7  . Smokeless tobacco: Never Used  Substance and Sexual Activity  . Alcohol use: No  . Drug use: No  . Sexual activity: Yes  Lifestyle  . Physical activity    Days per week: Not on file    Minutes per session: Not on file  . Stress: Not on file  Relationships  . Social Herbalist on phone: Not on file    Gets together: Not on file    Attends religious service: Not on file    Active member of club or organization: Not on file    Attends meetings of clubs or organizations: Not on file    Relationship status: Not on file  . Intimate partner violence    Fear of current or ex partner: Not on file    Emotionally abused: Not on file    Physically abused: Not on file    Forced sexual activity: Not on file  Other Topics Concern  . Not  on file  Social History Narrative  . Not on file    FAMILY HISTORY:  Family History  Problem Relation Age of Onset  . Colon cancer Neg Hx     CURRENT MEDICATIONS:  Outpatient Encounter Medications as of 12/22/2018  Medication Sig Note  . atorvastatin (LIPITOR) 10 MG tablet Take 10 mg by mouth at bedtime.    . Cholecalciferol (VITAMIN D3) 5000 units CAPS Take 5,000 Units by mouth daily.    Marland Kitchen lisinopril (PRINIVIL,ZESTRIL) 10 MG tablet Take 10 mg by mouth daily.   . metoprolol (LOPRESSOR) 50 MG tablet Take 50 mg by mouth 2 (two) times daily.   . Multiple Vitamins-Minerals (CENTRUM SILVER 50+MEN PO) Take 1 tablet by mouth daily.   Marland Kitchen RELION GLUCOSE TEST STRIPS test strip USE 1 STRIP TO CHECK GLUCOSE ONCE DAILY   . vitamin B-12 (CYANOCOBALAMIN) 1000 MCG tablet Take 1,000 mcg by mouth daily.    . diphenoxylate-atropine (LOMOTIL) 2.5-0.025 MG tablet Take 1 tablet by mouth 4 (four) times daily as needed for diarrhea or loose stools. (Patient not taking: Reported on 12/08/2018)   .  lidocaine-prilocaine (EMLA) cream Apply 1 application topically as needed. (Patient not taking: Reported on 12/08/2018)   . ondansetron (ZOFRAN ODT) 8 MG disintegrating tablet Take 1 tablet (8 mg total) by mouth every 8 (eight) hours as needed for nausea or vomiting. (Patient not taking: Reported on 12/08/2018)   . [DISCONTINUED] prochlorperazine (COMPAZINE) 10 MG tablet Take 1 tablet (10 mg total) by mouth every 6 (six) hours as needed (Nausea or vomiting). 08/05/2016: New Med   Facility-Administered Encounter Medications as of 12/22/2018  Medication  . sodium chloride flush (NS) 0.9 % injection 10 mL    ALLERGIES:  Allergies  Allergen Reactions  . Amoxicillin Hives    Has patient had a PCN reaction causing immediate rash, facial/tongue/throat swelling, SOB or lightheadedness with hypotension: No Has patient had a PCN reaction causing severe rash involving mucus membranes or skin necrosis: Yes Has patient had a PCN reaction that required hospitalization No Has patient had a PCN reaction occurring within the last 10 years: No If all of the above answers are "NO", then may proceed with Cephalosporin use.   . Tamsulosin Hcl     Cant sleep, confusion      PHYSICAL EXAM:  ECOG Performance status: 1  Vitals:   12/22/18 0855 12/22/18 0857  BP: 92/61   Pulse: 88   Resp: 18   Temp:  97.9 F (36.6 C)  SpO2: 100%    Filed Weights   12/22/18 0855  Weight: 161 lb 6.4 oz (73.2 kg)    Physical Exam Vitals signs reviewed.  Constitutional:      Appearance: Normal appearance.  HENT:     Head: Normocephalic.     Nose: Nose normal.     Mouth/Throat:     Mouth: Mucous membranes are moist.     Pharynx: Oropharynx is clear.  Eyes:     Extraocular Movements: Extraocular movements intact.     Conjunctiva/sclera: Conjunctivae normal.  Neck:     Musculoskeletal: Normal range of motion.  Cardiovascular:     Rate and Rhythm: Normal rate and regular rhythm.     Pulses: Normal pulses.      Heart sounds: Normal heart sounds.  Pulmonary:     Effort: Pulmonary effort is normal.     Breath sounds: Normal breath sounds.  Abdominal:     General: Bowel sounds are normal.  Musculoskeletal: Normal range of motion.  Skin:    General: Skin is warm and dry.  Neurological:     General: No focal deficit present.     Mental Status: He is alert and oriented to person, place, and time.  Psychiatric:        Mood and Affect: Mood normal.        Behavior: Behavior normal.        Thought Content: Thought content normal.        Judgment: Judgment normal.      LABORATORY DATA:  I have reviewed the labs as listed.  CBC    Component Value Date/Time   WBC 10.0 12/22/2018 0836   RBC 3.79 (L) 12/22/2018 0836   HGB 13.1 12/22/2018 0836   HCT 41.6 12/22/2018 0836   PLT 144 (L) 12/22/2018 0836   MCV 109.8 (H) 12/22/2018 0836   MCH 34.6 (H) 12/22/2018 0836   MCHC 31.5 12/22/2018 0836   RDW 14.7 12/22/2018 0836   LYMPHSABS 2.8 12/22/2018 0836   MONOABS 1.3 (H) 12/22/2018 0836   EOSABS 0.3 12/22/2018 0836   BASOSABS 0.1 12/22/2018 0836   CMP Latest Ref Rng & Units 12/22/2018 12/08/2018 11/24/2018  Glucose 70 - 99 mg/dL 148(H) 149(H) 160(H)  BUN 8 - 23 mg/dL '17 15 15  ' Creatinine 0.61 - 1.24 mg/dL 0.87 0.92 0.92  Sodium 135 - 145 mmol/L 138 139 137  Potassium 3.5 - 5.1 mmol/L 4.4 3.9 4.0  Chloride 98 - 111 mmol/L 103 107 107  CO2 22 - 32 mmol/L '25 24 24  ' Calcium 8.9 - 10.3 mg/dL 9.3 8.8(L) 8.8(L)  Total Protein 6.5 - 8.1 g/dL 6.8 6.4(L) 6.6  Total Bilirubin 0.3 - 1.2 mg/dL 0.8 0.9 1.2  Alkaline Phos 38 - 126 U/L 52 45 39  AST 15 - 41 U/L '29 31 30  ' ALT 0 - 44 U/L '27 24 21     ' I have independently reviewed his scans and discussed with the patient.   ASSESSMENT & PLAN:   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[place]) 1.  Stage IV colon cancer (pT3 pN1 M1) MSI high: - K-ras mutation negative, BRAF V600 E+, PIK3CA, TP53, CDKN2A -12 cycles of FOLFOX from 08/26/2016 through 01/27/2017  -Opdivo from 02/23/2017 through 07/13/2017 -FOLFIRI and bevacizumab from 07/27/2017 through 10/13/2018 - CT CAP from 11/08/2018 showed stable disease and abdomen and pelvis.  Mild progression of linear reticulonodular opacities in the lungs likely atypical infectious process. - 5-FU and bevacizumab maintenance started on 10/27/2018. -Labs are acceptable to proceed with treatment today. - He will return to clinic in two weeks.    2.  Peripheral neuropathy: -He reportedly stopped taking gabapentin.  His tingling and numbness has gotten better.  3.  Diarrhea: -Continue  Imodium and Lomotil as needed.  4.  Weight loss: - Continue suplementing with boost 2 cans/day.    Total time spent is 25 minutes with more than 50% of the time spent discussing treatment plan, counseling and coordination of care.  Orders placed this encounter:  Orders Placed This Encounter  Procedures  . CBC with Differential/Platelet  . Comprehensive metabolic panel  . [place] [phone]

## 2018-12-22 NOTE — Patient Instructions (Signed)
[facility] Discharge Instructions for Patients Receiving Chemotherapy  Today you received the following chemotherapy agents   To help prevent nausea and vomiting after your treatment, we encourage you to take your nausea medication   If you develop nausea and vomiting that is not controlled by your nausea medication, call the clinic.   BELOW ARE SYMPTOMS THAT SHOULD BE REPORTED IMMEDIATELY:  *FEVER GREATER THAN 100.5 F  *CHILLS WITH OR WITHOUT FEVER  NAUSEA AND VOMITING THAT IS NOT CONTROLLED WITH YOUR NAUSEA MEDICATION  *UNUSUAL SHORTNESS OF BREATH  *UNUSUAL BRUISING OR BLEEDING  TENDERNESS IN MOUTH AND THROAT WITH OR WITHOUT PRESENCE OF ULCERS  *URINARY PROBLEMS  *BOWEL PROBLEMS  UNUSUAL RASH Items with * indicate a potential emergency and should be followed up as soon as possible.  Feel free to call the clinic should you have any questions or concerns. The clinic phone number is [phone].  Please show the CHEMO ALERT CARD at check-in to the Emergency Department and triage nurse.   

## 2018-12-24 ENCOUNTER — Encounter (HOSPITAL_COMMUNITY): Payer: Self-pay

## 2018-12-24 ENCOUNTER — Inpatient Hospital Stay (HOSPITAL_COMMUNITY): Payer: Medicare Other | Attending: Hematology

## 2018-12-24 VITALS — BP 132/60 | HR 50 | Temp 97.4°F | Resp 18

## 2018-12-24 DIAGNOSIS — C184 Malignant neoplasm of transverse colon: Secondary | ICD-10-CM | POA: Diagnosis not present

## 2018-12-24 DIAGNOSIS — E1142 Type 2 diabetes mellitus with diabetic polyneuropathy: Secondary | ICD-10-CM | POA: Insufficient documentation

## 2018-12-24 DIAGNOSIS — Z79899 Other long term (current) drug therapy: Secondary | ICD-10-CM | POA: Diagnosis not present

## 2018-12-24 DIAGNOSIS — Z87891 Personal history of nicotine dependence: Secondary | ICD-10-CM | POA: Diagnosis not present

## 2018-12-24 DIAGNOSIS — Z5111 Encounter for antineoplastic chemotherapy: Secondary | ICD-10-CM | POA: Insufficient documentation

## 2018-12-24 DIAGNOSIS — R197 Diarrhea, unspecified: Secondary | ICD-10-CM | POA: Diagnosis not present

## 2018-12-24 DIAGNOSIS — R634 Abnormal weight loss: Secondary | ICD-10-CM | POA: Diagnosis not present

## 2018-12-24 DIAGNOSIS — Z95828 Presence of other vascular implants and grafts: Secondary | ICD-10-CM

## 2018-12-24 MED ORDER — HEPARIN SOD (PORK) LOCK FLUSH 100 UNIT/ML IV SOLN
500.0000 [IU] | Freq: Once | INTRAVENOUS | Status: AC | PRN
  Administered 2018-12-24: 500 [IU]

## 2018-12-24 MED ORDER — SODIUM CHLORIDE 0.9% FLUSH
10.0000 mL | INTRAVENOUS | Status: DC | PRN
  Administered 2018-12-24: 10 mL
  Filled 2018-12-24: qty 10

## 2018-12-24 NOTE — Patient Instructions (Signed)
[facility] Discharge Instructions for Patients Receiving Chemotherapy  Today you received the following chemotherapy agents   To help prevent nausea and vomiting after your treatment, we encourage you to take your nausea medication   If you develop nausea and vomiting that is not controlled by your nausea medication, call the clinic.   BELOW ARE SYMPTOMS THAT SHOULD BE REPORTED IMMEDIATELY:  *FEVER GREATER THAN 100.5 F  *CHILLS WITH OR WITHOUT FEVER  NAUSEA AND VOMITING THAT IS NOT CONTROLLED WITH YOUR NAUSEA MEDICATION  *UNUSUAL SHORTNESS OF BREATH  *UNUSUAL BRUISING OR BLEEDING  TENDERNESS IN MOUTH AND THROAT WITH OR WITHOUT PRESENCE OF ULCERS  *URINARY PROBLEMS  *BOWEL PROBLEMS  UNUSUAL RASH Items with * indicate a potential emergency and should be followed up as soon as possible.  Feel free to call the clinic should you have any questions or concerns. The clinic phone number is [phone].  Please show the CHEMO ALERT CARD at check-in to the Emergency Department and triage nurse.   

## 2018-12-24 NOTE — Progress Notes (Signed)
Pt presents today for pump d/c. VSS. Pt has no complaints of any changes since the last visit. Pump d/c'd and port flushed with 85mls of NS and 500 units of Heparin. Blood return noted. Band aid applied.  No complaints at this time. Discharged from clinic ambulatory. F/U with [facility] as scheduled.

## 2019-01-05 ENCOUNTER — Inpatient Hospital Stay (HOSPITAL_COMMUNITY): Payer: Medicare Other

## 2019-01-05 ENCOUNTER — Encounter (HOSPITAL_COMMUNITY): Payer: Self-pay | Admitting: Hematology

## 2019-01-05 ENCOUNTER — Inpatient Hospital Stay (HOSPITAL_BASED_OUTPATIENT_CLINIC_OR_DEPARTMENT_OTHER): Payer: Medicare Other | Admitting: Hematology

## 2019-01-05 ENCOUNTER — Other Ambulatory Visit: Payer: Self-pay

## 2019-01-05 VITALS — BP 157/81 | HR 71 | Temp 96.6°F | Resp 18

## 2019-01-05 VITALS — BP 150/74 | HR 77 | Temp 97.5°F | Resp 18 | Wt 163.0 lb

## 2019-01-05 DIAGNOSIS — Z95828 Presence of other vascular implants and grafts: Secondary | ICD-10-CM

## 2019-01-05 DIAGNOSIS — C184 Malignant neoplasm of transverse colon: Secondary | ICD-10-CM | POA: Diagnosis not present

## 2019-01-05 DIAGNOSIS — Z5111 Encounter for antineoplastic chemotherapy: Secondary | ICD-10-CM | POA: Diagnosis not present

## 2019-01-05 LAB — CBC WITH DIFFERENTIAL/PLATELET
Abs Immature Granulocytes: 0.01 10*3/uL (ref 0.00–0.07)
Basophils Absolute: 0.1 10*3/uL (ref 0.0–0.1)
Basophils Relative: 1 %
Eosinophils Absolute: 0.7 10*3/uL — ABNORMAL HIGH (ref 0.0–0.5)
Eosinophils Relative: 6 %
HCT: 39.4 % (ref 39.0–52.0)
Hemoglobin: 12.3 g/dL — ABNORMAL LOW (ref 13.0–17.0)
Immature Granulocytes: 0 %
Lymphocytes Relative: 36 %
Lymphs Abs: 4.1 10*3/uL — ABNORMAL HIGH (ref 0.7–4.0)
MCH: 34.4 pg — ABNORMAL HIGH (ref 26.0–34.0)
MCHC: 31.2 g/dL (ref 30.0–36.0)
MCV: 110.1 fL — ABNORMAL HIGH (ref 80.0–100.0)
Monocytes Absolute: 1.4 10*3/uL — ABNORMAL HIGH (ref 0.1–1.0)
Monocytes Relative: 12 %
Neutro Abs: 5.2 10*3/uL (ref 1.7–7.7)
Neutrophils Relative %: 45 %
Platelets: 157 10*3/uL (ref 150–400)
RBC: 3.58 MIL/uL — ABNORMAL LOW (ref 4.22–5.81)
RDW: 14.6 % (ref 11.5–15.5)
WBC: 11.4 10*3/uL — ABNORMAL HIGH (ref 4.0–10.5)
nRBC: 0 % (ref 0.0–0.2)

## 2019-01-05 LAB — COMPREHENSIVE METABOLIC PANEL
ALT: 22 U/L (ref 0–44)
AST: 26 U/L (ref 15–41)
Albumin: 3.4 g/dL — ABNORMAL LOW (ref 3.5–5.0)
Alkaline Phosphatase: 42 U/L (ref 38–126)
Anion gap: 7 (ref 5–15)
BUN: 17 mg/dL (ref 8–23)
CO2: 27 mmol/L (ref 22–32)
Calcium: 9 mg/dL (ref 8.9–10.3)
Chloride: 106 mmol/L (ref 98–111)
Creatinine, Ser: 0.81 mg/dL (ref 0.61–1.24)
GFR calc Af Amer: >60 mL/min (ref >60)
GFR calc non Af Amer: >60 mL/min (ref >60)
Glucose, Bld: 130 mg/dL — ABNORMAL HIGH (ref 70–99)
Potassium: 4.1 mmol/L (ref 3.5–5.1)
Sodium: 140 mmol/L (ref 135–145)
Total Bilirubin: 0.6 mg/dL (ref 0.3–1.2)
Total Protein: 6.4 g/dL — ABNORMAL LOW (ref 6.5–8.1)

## 2019-01-05 MED ORDER — SODIUM CHLORIDE 0.9 % IV SOLN
1920.0000 mg/m2 | INTRAVENOUS | Status: DC
  Administered 2019-01-05: 13:00:00 3950 mg via INTRAVENOUS
  Filled 2019-01-05: qty 79

## 2019-01-05 MED ORDER — SODIUM CHLORIDE 0.9% FLUSH
10.0000 mL | INTRAVENOUS | Status: DC | PRN
  Administered 2019-01-05: 09:00:00 10 mL
  Filled 2019-01-05: qty 10

## 2019-01-05 MED ORDER — PALONOSETRON HCL INJECTION 0.25 MG/5ML
0.2500 mg | Freq: Once | INTRAVENOUS | Status: AC
  Administered 2019-01-05: 11:00:00 0.25 mg via INTRAVENOUS
  Filled 2019-01-05: qty 5

## 2019-01-05 MED ORDER — SODIUM CHLORIDE 0.9 % IV SOLN
Freq: Once | INTRAVENOUS | Status: AC
  Administered 2019-01-05: 11:00:00 via INTRAVENOUS
  Filled 2019-01-05: qty 5

## 2019-01-05 MED ORDER — SODIUM CHLORIDE 0.9 % IV SOLN
10.0000 mg | Freq: Once | INTRAVENOUS | Status: AC
  Administered 2019-01-05: 10 mg via INTRAVENOUS
  Filled 2019-01-05: qty 10

## 2019-01-05 MED ORDER — SODIUM CHLORIDE 0.9 % IV SOLN
700.0000 mg | Freq: Once | INTRAVENOUS | Status: AC
  Administered 2019-01-05: 12:00:00 700 mg via INTRAVENOUS
  Filled 2019-01-05: qty 35

## 2019-01-05 MED ORDER — SODIUM CHLORIDE 0.9 % IV SOLN
Freq: Once | INTRAVENOUS | Status: AC
  Administered 2019-01-05: 11:00:00 via INTRAVENOUS

## 2019-01-05 MED ORDER — FLUOROURACIL CHEMO INJECTION 2.5 GM/50ML
320.0000 mg/m2 | Freq: Once | INTRAVENOUS | Status: AC
  Administered 2019-01-05: 13:00:00 650 mg via INTRAVENOUS
  Filled 2019-01-05: qty 13

## 2019-01-05 MED ORDER — SODIUM CHLORIDE 0.9 % IV SOLN
5.0000 mg/kg | Freq: Once | INTRAVENOUS | Status: AC
  Administered 2019-01-05: 12:00:00 400 mg via INTRAVENOUS
  Filled 2019-01-05: qty 16

## 2019-01-05 NOTE — Progress Notes (Signed)
[name] reviewed with and pt seen by Dr. Delton Coombes and pt approved for chemo tx today per MD                            Kevin Kirk tolerated chemo tx well without complaints or incident. Pt discharged with 5FU pump infusing without issues. VSS upon discharge. Pt discharged self ambulatory using his cane in satisfactory condition

## 2019-01-05 NOTE — Patient Instructions (Addendum)
[facility] at [facility] Discharge Instructions  You were seen today by Dr. Katragadda. He went over your recent lab results. He will see you back in 2 week for labs and follow up.   Thank you for choosing [facility] at [facility] to provide your oncology and hematology care.  To afford each patient quality time with our provider, please arrive at least 15 minutes before your scheduled appointment time.   If you have a lab appointment with the Cancer Center please come in thru the  Main Entrance and check in at the main information desk  You need to re-schedule your appointment should you arrive 10 or more minutes late.  We strive to give you quality time with our providers, and arriving late affects you and other patients whose appointments are after yours.  Also, if you no show three or more times for appointments you may be dismissed from the clinic at the providers discretion.     Again, thank you for choosing [facility].  Our hope is that these requests will decrease the amount of time that you wait before being seen by our physicians.       _____________________________________________________________  Should you have questions after your visit to [facility], please contact our office at [phone] between the hours of 8:00 a.m. and 4:30 p.m.  Voicemails left after 4:00 p.m. will not be returned until the following business day.  For prescription refill requests, have your pharmacy contact our office and allow 72 hours.    Cancer Center Support Programs:   > Cancer Support Group  2nd Tuesday of the month 1pm-2pm, Journey Room    

## 2019-01-05 NOTE — Patient Instructions (Addendum)
[facility] Discharge Instructions for Patients Receiving Chemotherapy   Beginning January 23rd 2017 lab work for the [facility] will be done in the  Main lab at [facility] on 1st floor. If you have a lab appointment with the [facility] please come in thru the  Main Entrance and check in at the main information desk   Today you received the following chemotherapy agents Bevazicumab,Leucovorin and 5FU.Follow-up as scheduled. Call clinic for any questions or concerns  To help prevent nausea and vomiting after your treatment, we encourage you to take your nausea medication   If you develop nausea and vomiting, or diarrhea that is not controlled by your medication, call the clinic.  The clinic phone number is [phone]. Office hours are Monday-Friday 8:30am-5:00pm.  BELOW ARE SYMPTOMS THAT SHOULD BE REPORTED IMMEDIATELY:  *FEVER GREATER THAN 101.0 F  *CHILLS WITH OR WITHOUT FEVER  NAUSEA AND VOMITING THAT IS NOT CONTROLLED WITH YOUR NAUSEA MEDICATION  *UNUSUAL SHORTNESS OF BREATH  *UNUSUAL BRUISING OR BLEEDING  TENDERNESS IN MOUTH AND THROAT WITH OR WITHOUT PRESENCE OF ULCERS  *URINARY PROBLEMS  *BOWEL PROBLEMS  UNUSUAL RASH Items with * indicate a potential emergency and should be followed up as soon as possible. If you have an emergency after office hours please contact your primary care physician or go to the nearest emergency department.  Please call the clinic during office hours if you have any questions or concerns.   You may also contact the Patient Navigator at [phone] should you have any questions or need assistance in obtaining follow up care.      Resources For Cancer Patients and their Caregivers ? American Cancer Society: Can assist with transportation, wigs, general needs, runs Look Good Feel Better.        364-630-2094 ? Cancer Care: Provides financial assistance, online support groups, medication/co-pay assistance.   1-800-813-HOPE 515-566-4580) ? [facility] Assists [place] cancer patients and their families through emotional , educational and financial support.  832 053 4929 ? [place] DSS Where to apply for food stamps, Medicaid and utility assistance. [phone] ? [facility]: Transportation to medical appointments. 719-694-8116 ? Social Security Administration: May apply for disability if have a Stage IV cancer. 678-028-1856 (606) [phone] ? [employer], Disability and Transit Services: Assists with nutrition, care and transit needs. [phone]

## 2019-01-05 NOTE — Progress Notes (Signed)
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[place])   07/27/2017 -  Chemotherapy   The patient had palonosetron (ALOXI) injection 0.25 mg, 0.25 mg, Intravenous,  Once, 38 of 38 cycles Administration: 0.25 mg (07/27/2017), 0.25 mg (08/10/2017), 0.25 mg (08/24/2017), 0.25 mg (09/07/2017), 0.25 mg (09/21/2017), 0.25 mg (10/05/2017), 0.25 mg (10/19/2017), 0.25 mg (11/02/2017), 0.25 mg (11/16/2017), 0.25 mg (11/30/2017), 0.25 mg (12/14/2017), 0.25 mg (12/28/2017), 0.25 mg (01/12/2018), 0.25 mg (01/26/2018), 0.25 mg (02/09/2018), 0.25 mg (02/24/2018), 0.25 mg (03/10/2018), 0.25 mg (03/29/2018), 0.25 mg (04/12/2018), 0.25 mg (04/26/2018), 0.25 mg (05/10/2018), 0.25 mg (05/24/2018), 0.25 mg (06/07/2018), 0.25 mg (06/21/2018), 0.25 mg (07/05/2018), 0.25 mg (07/19/2018), 0.25 mg (08/03/2018), 0.25 mg (08/18/2018), 0.25 mg (09/01/2018), 0.25 mg (09/15/2018), 0.25 mg (09/29/2018), 0.25 mg (10/13/2018), 0.25 mg (10/27/2018), 0.25 mg (11/10/2018), 0.25 mg (11/24/2018), 0.25 mg (12/08/2018), 0.25 mg (12/22/2018), 0.25 mg (01/05/2019) pegfilgrastim-cbqv (UDENYCA) injection 6 mg, 6 mg, Subcutaneous, Once, 7 of 7 cycles Administration: 6 mg (10/07/2017), 6 mg (10/21/2017), 6 mg (11/04/2017), 6 mg (11/18/2017), 6 mg (12/02/2017), 6 mg (12/16/2017), 6 mg (12/30/2017) bevacizumab-bvzr (ZIRABEV) chemo injection 400 mg, 5 mg/kg = 400 mg (100 % of original dose 5 mg/kg), Intravenous,  Once, 2 of 2 cycles Dose modification: 5 mg/kg (original dose 5 mg/kg, Cycle 35) bevacizumab (AVASTIN) 450 mg in sodium chloride 0.9 % 100 mL chemo infusion, 5 mg/kg = 450 mg, Intravenous,  Once,  35 of 35 cycles Dose modification: 4 mg/kg (80 % of original dose 5 mg/kg, Cycle 5, Reason: Provider Judgment), 5 mg/kg (original dose 5 mg/kg, Cycle 16, Reason: Other (see comments)), 5 mg/kg (original dose 5 mg/kg, Cycle 35) Administration: 450 mg (07/27/2017), 450 mg (08/10/2017), 450 mg (08/24/2017), 450 mg (09/07/2017), 450 mg (09/21/2017), 450 mg (10/05/2017), 450 mg (10/19/2017), 450 mg (11/02/2017), 450 mg (11/16/2017), 450 mg (11/30/2017), 450 mg (12/14/2017), 450 mg (12/28/2017), 400 mg (01/12/2018), 400 mg (01/26/2018), 400 mg (02/09/2018), 400 mg (02/24/2018), 400 mg (03/10/2018), 400 mg (03/29/2018), 400 mg (04/12/2018), 400 mg (04/26/2018), 400 mg (05/10/2018), 400 mg (05/24/2018), 400 mg (06/07/2018), 400 mg (06/21/2018), 400 mg (07/05/2018), 400 mg (07/19/2018), 400 mg (08/03/2018), 400 mg (08/18/2018), 400 mg (09/01/2018), 400 mg (09/15/2018), 400 mg (09/29/2018), 400 mg (10/13/2018), 400 mg (10/27/2018), 400 mg (11/10/2018), 400 mg (11/24/2018) irinotecan (CAMPTOSAR) 400 mg in dextrose 5 % 500 mL chemo infusion, 180 mg/m2 = 400 mg, Intravenous,  Once, 32 of 32 cycles Dose modification: 144 mg/m2 (80 % of original dose 180 mg/m2, Cycle 5, Reason: Provider Judgment), 135 mg/m2 (75 % of original dose 180 mg/m2, Cycle 30, Reason: Other (see comments), Comment: diarrhea) Administration: 400 mg (07/27/2017), 400 mg (08/10/2017), 400 mg (08/24/2017), 400 mg (09/07/2017), 320 mg (09/21/2017), 320 mg (10/05/2017), 320 mg (10/19/2017), 320 mg (11/02/2017), 320 mg (11/16/2017), 320 mg (11/30/2017), 320 mg (12/14/2017), 320 mg (12/28/2017), 320 mg (01/12/2018), 320 mg (01/26/2018), 320 mg (02/09/2018), 300 mg (02/24/2018), 300 mg (03/10/2018), 300 mg (03/29/2018), 300 mg (04/12/2018), 300 mg (04/26/2018), 300 mg (05/10/2018), 300 mg (05/24/2018), 300 mg (06/07/2018), 300 mg (06/21/2018), 300 mg (07/05/2018), 300 mg (07/19/2018), 300 mg (08/03/2018), 300 mg (08/18/2018), 300 mg (09/01/2018), 280 mg (09/15/2018), 280  mg (09/29/2018), 280 mg (10/13/2018) leucovorin 800 mg in dextrose  5 % 250 mL infusion, 872 mg, Intravenous,  Once, 38 of 38 cycles Dose modification: 320 mg/m2 (80 % of original dose 400 mg/m2, Cycle 5, Reason: Provider Judgment) Administration: 800 mg (07/27/2017), 800 mg (08/10/2017), 800 mg (08/24/2017), 800 mg (09/07/2017), 700 mg (09/21/2017), 700 mg (10/05/2017), 700 mg (10/19/2017), 700 mg (11/02/2017), 700 mg (11/16/2017), 700 mg (11/30/2017), 700 mg (12/14/2017), 700 mg (12/28/2017), 700 mg (01/12/2018), 700 mg (01/26/2018), 700 mg (02/09/2018), 700 mg (02/24/2018), 700 mg (03/10/2018), 700 mg (03/29/2018), 700 mg (04/12/2018), 700 mg (04/26/2018), 700 mg (05/10/2018), 700 mg (05/24/2018), 700 mg (06/07/2018), 700 mg (06/21/2018), 700 mg (07/05/2018), 700 mg (07/19/2018), 700 mg (08/03/2018), 700 mg (08/18/2018), 700 mg (09/01/2018), 700 mg (09/15/2018), 700 mg (09/29/2018), 700 mg (10/13/2018), 700 mg (10/27/2018), 700 mg (11/10/2018), 700 mg (11/24/2018), 700 mg (12/08/2018), 700 mg (12/22/2018), 700 mg (01/05/2019) fluorouracil (ADRUCIL) chemo injection 850 mg, 400 mg/m2 = 850 mg, Intravenous,  Once, 38 of 38 cycles Dose modification: 320 mg/m2 (80 % of original dose 400 mg/m2, Cycle 5, Reason: Provider Judgment) Administration: 850 mg (07/27/2017), 850 mg (08/10/2017), 850 mg (08/24/2017), 850 mg (09/07/2017), 700 mg (09/21/2017), 700 mg (10/05/2017), 700 mg (10/19/2017), 700 mg (11/02/2017), 700 mg (11/16/2017), 700 mg (11/30/2017), 700 mg (12/14/2017), 700 mg (12/28/2017), 700 mg (01/12/2018), 700 mg (01/26/2018), 700 mg (02/09/2018), 650 mg (02/24/2018), 650 mg (03/10/2018), 650 mg (03/29/2018), 650 mg (04/12/2018), 650 mg (04/26/2018), 650 mg (05/10/2018), 650 mg (05/24/2018), 650 mg (06/07/2018), 650 mg (06/21/2018), 650 mg (07/05/2018), 650 mg (07/19/2018), 650 mg (08/03/2018), 650 mg (08/18/2018), 650 mg (09/01/2018), 650 mg (09/15/2018), 650 mg (09/29/2018), 650 mg (10/13/2018), 650 mg (10/27/2018), 650 mg (11/10/2018), 650 mg (11/24/2018), 650 mg (12/08/2018), 650 mg (12/22/2018), 650 mg (01/05/2019) fosaprepitant (EMEND) 150 mg in sodium  chloride 0.9 % 145 mL IVPB, , Intravenous,  Once, 4 of 4 cycles Administration:  (11/24/2018),  (12/08/2018),  (12/22/2018),  (01/05/2019) fluorouracil (ADRUCIL) 5,250 mg in sodium chloride 0.9 % 145 mL chemo infusion, 2,400 mg/m2 = 5,250 mg, Intravenous, 1 Day/Dose, 38 of 38 cycles Dose modification: 1,920 mg/m2 (80 % of original dose 2,400 mg/m2, Cycle 5, Reason: Provider Judgment) Administration: 5,250 mg (07/27/2017), 5,250 mg (08/10/2017), 5,250 mg (08/24/2017), 5,250 mg (09/07/2017), 4,200 mg (09/21/2017), 4,200 mg (10/05/2017), 4,200 mg (10/19/2017), 4,200 mg (11/02/2017), 4,200 mg (11/16/2017), 4,200 mg (11/30/2017), 4,200 mg (12/14/2017), 4,200 mg (12/28/2017), 4,200 mg (01/12/2018), 4,200 mg (01/26/2018), 4,200 mg (02/09/2018), 3,950 mg (02/24/2018), 3,950 mg (03/10/2018), 3,950 mg (03/29/2018), 3,950 mg (04/12/2018), 3,950 mg (04/26/2018), 3,950 mg (05/10/2018), 3,950 mg (05/24/2018), 3,950 mg (06/07/2018), 3,950 mg (06/21/2018), 3,950 mg (07/05/2018), 3,950 mg (07/19/2018), 3,950 mg (08/03/2018), 3,950 mg (08/18/2018), 3,950 mg (09/01/2018), 3,950 mg (09/15/2018), 3,950 mg (09/29/2018), 3,950 mg (10/13/2018), 3,950 mg (10/27/2018), 3,950 mg (11/10/2018), 3,950 mg (11/24/2018), 3,950 mg (12/08/2018), 3,950 mg (12/22/2018), 3,950 mg (01/05/2019) bevacizumab-bvzr (ZIRABEV) 400 mg in sodium chloride 0.9 % 100 mL chemo infusion, 5 mg/kg = 400 mg (100 % of original dose 5 mg/kg), Intravenous,  Once, 3 of 3 cycles Dose modification: 5 mg/kg (original dose 5 mg/kg, Cycle 36) Administration: 400 mg (12/08/2018), 400 mg (12/22/2018), 400 mg (01/05/2019)  for chemotherapy treatment.       CANCER STAGING: Cancer Staging Malignant neoplasm of transverse colon [facility]) Staging form: Colon and Rectum, AJCC 8th Edition - Clinical: cT3, cN1a, cM1 - Signed by Twana First, Kevin Kirk on 03/09/2017 - Pathologic: No stage assigned - Unsigned    INTERVAL HISTORY:  Kevin Kirk 85  y.o. male seen for follow-up of for stage IV colon cancer.  He is receiving  maintenance therapy every 2 weeks.  Gained 2 pounds since last visit.  He had diarrhea for 3 days and took Lomotil 2 pills 3 times a day which helped.  He also had a sore on the right side of the tongue which finally healed.  He had one episode of nausea which was controlled with Zofran.  Denies any bleeding episodes.  REVIEW OF SYSTEMS:  Review of Systems  HENT:   Positive for mouth sores.   Gastrointestinal: Positive for diarrhea and nausea.  All other systems reviewed and are negative.    PAST MEDICAL/SURGICAL HISTORY:  Past Medical History:  Diagnosis Date   Chronic pulmonary embolism ([place]) 68/0881   compliction of the valve replacement surgery   COPD (chronic obstructive pulmonary disease) ([place])    Diabetes ([place]) 04/22/2016   H/O aortic valve replacement 10/2008   Hypercholesteremia    Hypertension    Pulmonary emboli ([place]) 10/2008   Sleep apnea    Past Surgical History:  Procedure Laterality Date   AORTIC VALVE REPLACEMENT     2010   CARDIAC CATHETERIZATION  2010   COLONOSCOPY N/A 06/05/2016   Procedure: COLONOSCOPY;  Surgeon: Rogene Houston, Kevin Kirk;  Location: [facility] ENDO SUITE;  Service: Endoscopy;  Laterality: N/A;   PARTIAL COLECTOMY N/A 07/11/2016   Procedure: PARTIAL COLECTOMY;  Surgeon: Aviva Signs, Kevin Kirk;  Location: [facility] ORS;  Service: General;  Laterality: N/A;   PORTACATH PLACEMENT N/A 08/13/2016   Procedure: INSERTION PORT-A-CATH LEFT SUBCLAVIAN;  Surgeon: Aviva Signs, Kevin Kirk;  Location: [facility] ORS;  Service: General;  Laterality: N/A;   REPLACEMENT TOTAL KNEE     1996   spenectomy     from trauma     SOCIAL HISTORY:  Social History   Socioeconomic History   Marital status: Widowed    Spouse name: Not on file   Number of children: Not on file   Years of education: Not on file   Highest education level: Not on file  Occupational History   Not on file  Social Needs   Financial resource strain: Not on file   Food insecurity    Worry: Not on file      Inability: Not on file   Transportation needs    Medical: Not on file    Non-medical: Not on file  Tobacco Use   Smoking status: Former Smoker    Years: 15.00    Types: Cigarettes    Quit date: 1964    Years since quitting: 56.8   Smokeless tobacco: Never Used  Substance and Sexual Activity   Alcohol use: No   Drug use: No   Sexual activity: Yes  Lifestyle   Physical activity    Days per week: Not on file    Minutes per session: Not on file   Stress: Not on file  Relationships   Social connections    Talks on phone: Not on file    Gets together: Not on file    Attends religious service: Not on file    Active member of club or organization: Not on file    Attends meetings of clubs or organizations: Not on file    Relationship status: Not on file   Intimate partner violence    Fear of current or ex partner: Not on file    Emotionally abused: Not on file    Physically abused: Not on file    Forced sexual  activity: Not on file  Other Topics Concern   Not on file  Social History Narrative   Not on file    FAMILY HISTORY:  Family History  Problem Relation Age of Onset   Colon cancer Neg Hx     CURRENT MEDICATIONS:  Outpatient Encounter Medications as of 01/05/2019  Medication Sig Note   atorvastatin (LIPITOR) 10 MG tablet Take 10 mg by mouth at bedtime.     Cholecalciferol (VITAMIN D3) 5000 units CAPS Take 5,000 Units by mouth daily.     lisinopril (PRINIVIL,ZESTRIL) 10 MG tablet Take 10 mg by mouth daily.    metoprolol (LOPRESSOR) 50 MG tablet Take 50 mg by mouth 2 (two) times daily.    Multiple Vitamins-Minerals (CENTRUM SILVER 50+MEN PO) Take 1 tablet by mouth daily.    RELION GLUCOSE TEST STRIPS test strip USE 1 STRIP TO CHECK GLUCOSE ONCE DAILY    vitamin B-12 (CYANOCOBALAMIN) 1000 MCG tablet Take 1,000 mcg by mouth daily.     diphenoxylate-atropine (LOMOTIL) 2.5-0.025 MG tablet Take 1 tablet by mouth 4 (four) times daily as needed for  diarrhea or loose stools. (Patient not taking: Reported on 01/05/2019)    lidocaine-prilocaine (EMLA) cream Apply 1 application topically as needed. (Patient not taking: Reported on 01/05/2019)    ondansetron (ZOFRAN ODT) 8 MG disintegrating tablet Take 1 tablet (8 mg total) by mouth every 8 (eight) hours as needed for nausea or vomiting. (Patient not taking: Reported on 01/05/2019)    [DISCONTINUED] prochlorperazine (COMPAZINE) 10 MG tablet Take 1 tablet (10 mg total) by mouth every 6 (six) hours as needed (Nausea or vomiting). 08/05/2016: New Med   Facility-Administered Encounter Medications as of 01/05/2019  Medication   sodium chloride flush (NS) 0.9 % injection 10 mL    ALLERGIES:  Allergies  Allergen Reactions   Amoxicillin Hives    Has patient had a PCN reaction causing immediate rash, facial/tongue/throat swelling, SOB or lightheadedness with hypotension: No Has patient had a PCN reaction causing severe rash involving mucus membranes or skin necrosis: Yes Has patient had a PCN reaction that required hospitalization No Has patient had a PCN reaction occurring within the last 10 years: No If all of the above answers are "NO", then may proceed with Cephalosporin use.    Tamsulosin Hcl     Cant sleep, confusion      PHYSICAL EXAM:  ECOG Performance status: 1  Vitals:   01/05/19 0852  BP: (!) 150/74  Pulse: 77  Resp: 18  Temp: (!) 97.5 F (36.4 C)  SpO2: 99%   Filed Weights   01/05/19 0852  Weight: 163 lb (73.9 kg)    Physical Exam Vitals signs reviewed.  Constitutional:      Appearance: Normal appearance.  HENT:     Head: Normocephalic.     Nose: Nose normal.     Mouth/Throat:     Mouth: Mucous membranes are moist.     Pharynx: Oropharynx is clear.  Eyes:     Extraocular Movements: Extraocular movements intact.     Conjunctiva/sclera: Conjunctivae normal.  Neck:     Musculoskeletal: Normal range of motion.  Cardiovascular:     Rate and Rhythm: Normal  rate and regular rhythm.     Pulses: Normal pulses.     Heart sounds: Normal heart sounds.  Pulmonary:     Effort: Pulmonary effort is normal.     Breath sounds: Normal breath sounds.  Abdominal:     General: Bowel sounds are normal.  Musculoskeletal:  Normal range of motion.  Skin:    General: Skin is warm and dry.  Neurological:     General: No focal deficit present.     Mental Status: He is alert and oriented to person, place, and time.  Psychiatric:        Mood and Affect: Mood normal.        Behavior: Behavior normal.        Thought Content: Thought content normal.        Judgment: Judgment normal.      LABORATORY DATA:  I have reviewed the labs as listed.  CBC    Component Value Date/Time   WBC 11.4 (H) 01/05/2019 0856   RBC 3.58 (L) 01/05/2019 0856   HGB 12.3 (L) 01/05/2019 0856   HCT 39.4 01/05/2019 0856   PLT 157 01/05/2019 0856   MCV 110.1 (H) 01/05/2019 0856   MCH 34.4 (H) 01/05/2019 0856   MCHC 31.2 01/05/2019 0856   RDW 14.6 01/05/2019 0856   LYMPHSABS 4.1 (H) 01/05/2019 0856   MONOABS 1.4 (H) 01/05/2019 0856   EOSABS 0.7 (H) 01/05/2019 0856   BASOSABS 0.1 01/05/2019 0856   CMP Latest Ref Rng & Units 01/05/2019 12/22/2018 12/08/2018  Glucose 70 - 99 mg/dL 130(H) 148(H) 149(H)  BUN 8 - 23 mg/dL '17 17 15  ' Creatinine 0.61 - 1.24 mg/dL 0.81 0.87 0.92  Sodium 135 - 145 mmol/L 140 138 139  Potassium 3.5 - 5.1 mmol/L 4.1 4.4 3.9  Chloride 98 - 111 mmol/L 106 103 107  CO2 22 - 32 mmol/L '27 25 24  ' Calcium 8.9 - 10.3 mg/dL 9.0 9.3 8.8(L)  Total Protein 6.5 - 8.1 g/dL 6.4(L) 6.8 6.4(L)  Total Bilirubin 0.3 - 1.2 mg/dL 0.6 0.8 0.9  Alkaline Phos 38 - 126 U/L 42 52 45  AST 15 - 41 U/L '26 29 31  ' ALT 0 - 44 U/L '22 27 24     ' I have independently reviewed his scans and discussed with the patient.   ASSESSMENT & PLAN:   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[place]) 1.  Stage IV colon cancer (pT3 pN1 M1) MSI high: - K-ras mutation negative, BRAF V600 E+,  PIK3CA, TP53, CDKN2A -12 cycles of FOLFOX from 08/26/2016 through 01/27/2017 -Opdivo from 02/23/2017 through 07/13/2017 -FOLFIRI and bevacizumab from 07/27/2017 through 10/13/2018 - CT CAP from 11/08/2018 showed stable disease and abdomen and pelvis.  Mild progression of linear reticulonodular opacities in the lungs likely atypical infectious process. - 5-FU and bevacizumab maintenance started on 10/27/2018. - He did experience diarrhea for 3 days after last cycle.  He also had sore on the right side of the tongue which lasted few days.  He also had nausea for 1 day which was controlled with Zofran. -We reviewed his blood work.  We will proceed with next maintenance treatment today.  2.  Peripheral neuropathy: -He reportedly stopped taking gabapentin.  His tingling and numbness has gotten better.  3.  Diarrhea: -He had diarrhea for 3 days after last cycle.  He took Lomotil 2 pills 3 times a day which helped.  4.  Weight loss: - He gained couple of pounds from last week despite having diarrhea for 3 days.  He will continue boost 2 to 3 cans/day.   Total time spent is 25 minutes with more than 50% of the time spent discussing treatment plan, counseling and coordination of care.  Orders placed this encounter:  Orders Placed This Encounter  Procedures   CBC with Differential/Platelet  Comprehensive metabolic panel   Magnesium   CEA      [place] [phone]

## 2019-01-05 NOTE — Assessment & Plan Note (Signed)
1.  Stage IV colon cancer (pT3 pN1 M1) MSI high: - K-ras mutation negative, BRAF V600 E+, PIK3CA, TP53, CDKN2A -12 cycles of FOLFOX from 08/26/2016 through 01/27/2017 -Opdivo from 02/23/2017 through 07/13/2017 -FOLFIRI and bevacizumab from 07/27/2017 through 10/13/2018 - CT CAP from 11/08/2018 showed stable disease and abdomen and pelvis.  Mild progression of linear reticulonodular opacities in the lungs likely atypical infectious process. - 5-FU and bevacizumab maintenance started on 10/27/2018. - He did experience diarrhea for 3 days after last cycle.  He also had sore on the right side of the tongue which lasted few days.  He also had nausea for 1 day which was controlled with Zofran. -We reviewed his blood work.  We will proceed with next maintenance treatment today.  2.  Peripheral neuropathy: -He reportedly stopped taking gabapentin.  His tingling and numbness has gotten better.  3.  Diarrhea: -He had diarrhea for 3 days after last cycle.  He took Lomotil 2 pills 3 times a day which helped.  4.  Weight loss: - He gained couple of pounds from last week despite having diarrhea for 3 days.  He will continue boost 2 to 3 cans/day.

## 2019-01-06 LAB — CEA: CEA: 9.5 ng/mL — ABNORMAL HIGH (ref 0.0–4.7)

## 2019-01-07 ENCOUNTER — Encounter (HOSPITAL_COMMUNITY): Payer: Self-pay

## 2019-01-07 ENCOUNTER — Other Ambulatory Visit: Payer: Self-pay

## 2019-01-07 ENCOUNTER — Inpatient Hospital Stay (HOSPITAL_COMMUNITY): Payer: Medicare Other

## 2019-01-07 VITALS — BP 147/67 | HR 56 | Temp 96.9°F | Resp 18

## 2019-01-07 DIAGNOSIS — Z95828 Presence of other vascular implants and grafts: Secondary | ICD-10-CM

## 2019-01-07 DIAGNOSIS — C184 Malignant neoplasm of transverse colon: Secondary | ICD-10-CM

## 2019-01-07 DIAGNOSIS — Z5111 Encounter for antineoplastic chemotherapy: Secondary | ICD-10-CM | POA: Diagnosis not present

## 2019-01-07 MED ORDER — SODIUM CHLORIDE 0.9% FLUSH
10.0000 mL | INTRAVENOUS | Status: DC | PRN
  Administered 2019-01-07: 10 mL
  Filled 2019-01-07: qty 10

## 2019-01-07 MED ORDER — HEPARIN SOD (PORK) LOCK FLUSH 100 UNIT/ML IV SOLN
500.0000 [IU] | Freq: Once | INTRAVENOUS | Status: AC | PRN
  Administered 2019-01-07: 12:00:00 500 [IU]

## 2019-01-07 NOTE — Patient Instructions (Signed)
[facility] at [facility] Discharge Instructions  5FU pump discontinued with portacath flushed per protocol. Follow-up as scheduled. Call clinic for any questions or concerns   Thank you for choosing [facility] at [facility] to provide your oncology and hematology care.  To afford each patient quality time with our provider, please arrive at least 15 minutes before your scheduled appointment time.   If you have a lab appointment with the Cancer Center please come in thru the Main Entrance and check in at the main information desk.  You need to re-schedule your appointment should you arrive 10 or more minutes late.  We strive to give you quality time with our providers, and arriving late affects you and other patients whose appointments are after yours.  Also, if you no show three or more times for appointments you may be dismissed from the clinic at the providers discretion.     Again, thank you for choosing [facility].  Our hope is that these requests will decrease the amount of time that you wait before being seen by our physicians.       _____________________________________________________________  Should you have questions after your visit to  Cancer Center, please contact our office at [phone] between the hours of 8:00 a.m. and 4:30 p.m.  Voicemails left after 4:00 p.m. will not be returned until the following business day.  For prescription refill requests, have your pharmacy contact our office and allow 72 hours.    Due to Covid, you will need to wear a mask upon entering the hospital. If you do not have a mask, a mask will be given to you at the Main Entrance upon arrival. For doctor visits, patients may have 1 support person with them. For treatment visits, patients can not have anyone with them due to social distancing guidelines and our immunocompromised population.     

## 2019-01-07 NOTE — Progress Notes (Signed)
Kevin Kirk tolerated 5FU pump well without complaints or incident. 5FU pump discontinued with portacath flushed easily per protocol then de-accessed. VSS Pt discharged self ambulatory using his cane in satisfactory ocndition

## 2019-01-19 ENCOUNTER — Inpatient Hospital Stay (HOSPITAL_COMMUNITY): Payer: Medicare Other

## 2019-01-19 ENCOUNTER — Other Ambulatory Visit: Payer: Self-pay

## 2019-01-19 ENCOUNTER — Encounter (HOSPITAL_COMMUNITY): Payer: Self-pay | Admitting: Hematology

## 2019-01-19 ENCOUNTER — Inpatient Hospital Stay (HOSPITAL_BASED_OUTPATIENT_CLINIC_OR_DEPARTMENT_OTHER): Payer: Medicare Other | Admitting: Hematology

## 2019-01-19 VITALS — BP 131/58 | HR 61 | Temp 97.5°F | Resp 18 | Wt 164.2 lb

## 2019-01-19 VITALS — BP 145/60 | HR 52 | Temp 96.5°F | Resp 18

## 2019-01-19 DIAGNOSIS — C184 Malignant neoplasm of transverse colon: Secondary | ICD-10-CM | POA: Diagnosis not present

## 2019-01-19 DIAGNOSIS — Z95828 Presence of other vascular implants and grafts: Secondary | ICD-10-CM

## 2019-01-19 DIAGNOSIS — Z5111 Encounter for antineoplastic chemotherapy: Secondary | ICD-10-CM | POA: Diagnosis not present

## 2019-01-19 LAB — COMPREHENSIVE METABOLIC PANEL
ALT: 24 U/L (ref 0–44)
AST: 30 U/L (ref 15–41)
Albumin: 3.5 g/dL (ref 3.5–5.0)
Alkaline Phosphatase: 50 U/L (ref 38–126)
Anion gap: 10 (ref 5–15)
BUN: 19 mg/dL (ref 8–23)
CO2: 27 mmol/L (ref 22–32)
Calcium: 8.8 mg/dL — ABNORMAL LOW (ref 8.9–10.3)
Chloride: 100 mmol/L (ref 98–111)
Creatinine, Ser: 0.91 mg/dL (ref 0.61–1.24)
GFR calc Af Amer: >60 mL/min (ref >60)
GFR calc non Af Amer: >60 mL/min (ref >60)
Glucose, Bld: 168 mg/dL — ABNORMAL HIGH (ref 70–99)
Potassium: 4.2 mmol/L (ref 3.5–5.1)
Sodium: 137 mmol/L (ref 135–145)
Total Bilirubin: 0.8 mg/dL (ref 0.3–1.2)
Total Protein: 6.4 g/dL — ABNORMAL LOW (ref 6.5–8.1)

## 2019-01-19 LAB — CBC WITH DIFFERENTIAL/PLATELET
Abs Immature Granulocytes: 0.02 10*3/uL (ref 0.00–0.07)
Basophils Absolute: 0.1 10*3/uL (ref 0.0–0.1)
Basophils Relative: 1 %
Eosinophils Absolute: 0.4 10*3/uL (ref 0.0–0.5)
Eosinophils Relative: 4 %
HCT: 38.8 % — ABNORMAL LOW (ref 39.0–52.0)
Hemoglobin: 12.4 g/dL — ABNORMAL LOW (ref 13.0–17.0)
Immature Granulocytes: 0 %
Lymphocytes Relative: 32 %
Lymphs Abs: 3.3 10*3/uL (ref 0.7–4.0)
MCH: 35 pg — ABNORMAL HIGH (ref 26.0–34.0)
MCHC: 32 g/dL (ref 30.0–36.0)
MCV: 109.6 fL — ABNORMAL HIGH (ref 80.0–100.0)
Monocytes Absolute: 1.5 10*3/uL — ABNORMAL HIGH (ref 0.1–1.0)
Monocytes Relative: 14 %
Neutro Abs: 5.2 10*3/uL (ref 1.7–7.7)
Neutrophils Relative %: 49 %
Platelets: 166 10*3/uL (ref 150–400)
RBC: 3.54 MIL/uL — ABNORMAL LOW (ref 4.22–5.81)
RDW: 14.7 % (ref 11.5–15.5)
WBC: 10.4 10*3/uL (ref 4.0–10.5)
nRBC: 0 % (ref 0.0–0.2)

## 2019-01-19 LAB — MAGNESIUM: Magnesium: 2.1 mg/dL (ref 1.7–2.4)

## 2019-01-19 MED ORDER — SODIUM CHLORIDE 0.9 % IV SOLN
5.0000 mg/kg | Freq: Once | INTRAVENOUS | Status: AC
  Administered 2019-01-19: 400 mg via INTRAVENOUS
  Filled 2019-01-19: qty 16

## 2019-01-19 MED ORDER — SODIUM CHLORIDE 0.9 % IV SOLN
Freq: Once | INTRAVENOUS | Status: AC
  Administered 2019-01-19: 12:00:00 via INTRAVENOUS
  Filled 2019-01-19: qty 5

## 2019-01-19 MED ORDER — SODIUM CHLORIDE 0.9 % IV SOLN
1920.0000 mg/m2 | INTRAVENOUS | Status: DC
  Administered 2019-01-19: 3950 mg via INTRAVENOUS
  Filled 2019-01-19: qty 79

## 2019-01-19 MED ORDER — SODIUM CHLORIDE 0.9 % IV SOLN
Freq: Once | INTRAVENOUS | Status: AC
  Administered 2019-01-19: 11:00:00 via INTRAVENOUS

## 2019-01-19 MED ORDER — FLUOROURACIL CHEMO INJECTION 2.5 GM/50ML
320.0000 mg/m2 | Freq: Once | INTRAVENOUS | Status: AC
  Administered 2019-01-19: 650 mg via INTRAVENOUS
  Filled 2019-01-19: qty 13

## 2019-01-19 MED ORDER — SODIUM CHLORIDE 0.9% FLUSH
10.0000 mL | INTRAVENOUS | Status: DC | PRN
  Administered 2019-01-19: 10 mL
  Filled 2019-01-19: qty 10

## 2019-01-19 MED ORDER — SODIUM CHLORIDE 0.9 % IV SOLN
700.0000 mg | Freq: Once | INTRAVENOUS | Status: AC
  Administered 2019-01-19: 700 mg via INTRAVENOUS
  Filled 2019-01-19: qty 35

## 2019-01-19 MED ORDER — SODIUM CHLORIDE 0.9 % IV SOLN
10.0000 mg | Freq: Once | INTRAVENOUS | Status: AC
  Administered 2019-01-19: 10 mg via INTRAVENOUS
  Filled 2019-01-19: qty 10

## 2019-01-19 MED ORDER — PALONOSETRON HCL INJECTION 0.25 MG/5ML
0.2500 mg | Freq: Once | INTRAVENOUS | Status: AC
  Administered 2019-01-19: 0.25 mg via INTRAVENOUS
  Filled 2019-01-19: qty 5

## 2019-01-19 NOTE — Progress Notes (Signed)
[place], [place] [address]   CLINIC:  Medical Oncology/Hematology  PCP:  Tobe Sos, MD [address] 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[place])   07/27/2017 -  Chemotherapy   The patient had palonosetron (ALOXI) injection 0.25 mg, 0.25 mg, Intravenous,  Once, 39 of 40 cycles Administration: 0.25 mg (07/27/2017), 0.25 mg (08/10/2017), 0.25 mg (08/24/2017), 0.25 mg (09/07/2017), 0.25 mg (09/21/2017), 0.25 mg (10/05/2017), 0.25 mg (10/19/2017), 0.25 mg (11/02/2017), 0.25 mg (11/16/2017), 0.25 mg (11/30/2017), 0.25 mg (12/14/2017), 0.25 mg (12/28/2017), 0.25 mg (01/12/2018), 0.25 mg (01/26/2018), 0.25 mg (02/09/2018), 0.25 mg (02/24/2018), 0.25 mg (03/10/2018), 0.25 mg (03/29/2018), 0.25 mg (04/12/2018), 0.25 mg (04/26/2018), 0.25 mg (05/10/2018), 0.25 mg (05/24/2018), 0.25 mg (06/07/2018), 0.25 mg (06/21/2018), 0.25 mg (07/05/2018), 0.25 mg (07/19/2018), 0.25 mg (08/03/2018), 0.25 mg (08/18/2018), 0.25 mg (09/01/2018), 0.25 mg (09/15/2018), 0.25 mg (09/29/2018), 0.25 mg (10/13/2018), 0.25 mg (10/27/2018), 0.25 mg (11/10/2018), 0.25 mg (11/24/2018), 0.25 mg (12/08/2018), 0.25 mg (12/22/2018), 0.25 mg (01/05/2019), 0.25 mg (01/19/2019) pegfilgrastim-cbqv (UDENYCA) injection 6 mg, 6 mg, Subcutaneous, Once, 7 of 7 cycles Administration: 6 mg (10/07/2017), 6 mg (10/21/2017), 6 mg (11/04/2017), 6 mg (11/18/2017), 6 mg (12/02/2017), 6 mg (12/16/2017), 6 mg (12/30/2017) bevacizumab-bvzr (ZIRABEV) chemo injection 400 mg, 5 mg/kg = 400 mg (100 % of original dose 5 mg/kg), Intravenous,  Once, 2 of 2 cycles Dose modification: 5 mg/kg (original dose 5 mg/kg, Cycle 35) bevacizumab (AVASTIN) 450 mg in sodium chloride 0.9 % 100 mL chemo infusion, 5 mg/kg = 450 mg,  Intravenous,  Once, 35 of 35 cycles Dose modification: 4 mg/kg (80 % of original dose 5 mg/kg, Cycle 5, Reason: Provider Judgment), 5 mg/kg (original dose 5 mg/kg, Cycle 16, Reason: Other (see comments)), 5 mg/kg (original dose 5 mg/kg, Cycle 35) Administration: 450 mg (07/27/2017), 450 mg (08/10/2017), 450 mg (08/24/2017), 450 mg (09/07/2017), 450 mg (09/21/2017), 450 mg (10/05/2017), 450 mg (10/19/2017), 450 mg (11/02/2017), 450 mg (11/16/2017), 450 mg (11/30/2017), 450 mg (12/14/2017), 450 mg (12/28/2017), 400 mg (01/12/2018), 400 mg (01/26/2018), 400 mg (02/09/2018), 400 mg (02/24/2018), 400 mg (03/10/2018), 400 mg (03/29/2018), 400 mg (04/12/2018), 400 mg (04/26/2018), 400 mg (05/10/2018), 400 mg (05/24/2018), 400 mg (06/07/2018), 400 mg (06/21/2018), 400 mg (07/05/2018), 400 mg (07/19/2018), 400 mg (08/03/2018), 400 mg (08/18/2018), 400 mg (09/01/2018), 400 mg (09/15/2018), 400 mg (09/29/2018), 400 mg (10/13/2018), 400 mg (10/27/2018), 400 mg (11/10/2018), 400 mg (11/24/2018) irinotecan (CAMPTOSAR) 400 mg in dextrose 5 % 500 mL chemo infusion, 180 mg/m2 = 400 mg, Intravenous,  Once, 32 of 32 cycles Dose modification: 144 mg/m2 (80 % of original dose 180 mg/m2, Cycle 5, Reason: Provider Judgment), 135 mg/m2 (75 % of original dose 180 mg/m2, Cycle 30, Reason: Other (see comments), Comment: diarrhea) Administration: 400 mg (07/27/2017), 400 mg (08/10/2017), 400 mg (08/24/2017), 400 mg (09/07/2017), 320 mg (09/21/2017), 320 mg (10/05/2017), 320 mg (10/19/2017), 320 mg (11/02/2017), 320 mg (11/16/2017), 320 mg (11/30/2017), 320 mg (12/14/2017), 320 mg (12/28/2017), 320 mg (01/12/2018), 320 mg (01/26/2018), 320 mg (02/09/2018), 300 mg (02/24/2018), 300 mg (03/10/2018), 300 mg (03/29/2018), 300 mg (04/12/2018), 300 mg (04/26/2018), 300 mg (05/10/2018), 300 mg (05/24/2018), 300 mg (06/07/2018), 300 mg (06/21/2018), 300 mg (07/05/2018), 300 mg (07/19/2018), 300 mg (08/03/2018), 300 mg (08/18/2018), 300 mg (09/01/2018), 280  mg (09/15/2018), 280 mg (09/29/2018), 280 mg (10/13/2018) leucovorin  800 mg in dextrose 5 % 250 mL infusion, 872 mg, Intravenous,  Once, 39 of 40 cycles Dose modification: 320 mg/m2 (80 % of original dose 400 mg/m2, Cycle 5, Reason: Provider Judgment) Administration: 800 mg (07/27/2017), 800 mg (08/10/2017), 800 mg (08/24/2017), 800 mg (09/07/2017), 700 mg (09/21/2017), 700 mg (10/05/2017), 700 mg (10/19/2017), 700 mg (11/02/2017), 700 mg (11/16/2017), 700 mg (11/30/2017), 700 mg (12/14/2017), 700 mg (12/28/2017), 700 mg (01/12/2018), 700 mg (01/26/2018), 700 mg (02/09/2018), 700 mg (02/24/2018), 700 mg (03/10/2018), 700 mg (03/29/2018), 700 mg (04/12/2018), 700 mg (04/26/2018), 700 mg (05/10/2018), 700 mg (05/24/2018), 700 mg (06/07/2018), 700 mg (06/21/2018), 700 mg (07/05/2018), 700 mg (07/19/2018), 700 mg (08/03/2018), 700 mg (08/18/2018), 700 mg (09/01/2018), 700 mg (09/15/2018), 700 mg (09/29/2018), 700 mg (10/13/2018), 700 mg (10/27/2018), 700 mg (11/10/2018), 700 mg (11/24/2018), 700 mg (12/08/2018), 700 mg (12/22/2018), 700 mg (01/05/2019), 700 mg (01/19/2019) fluorouracil (ADRUCIL) chemo injection 850 mg, 400 mg/m2 = 850 mg, Intravenous,  Once, 39 of 40 cycles Dose modification: 320 mg/m2 (80 % of original dose 400 mg/m2, Cycle 5, Reason: Provider Judgment) Administration: 850 mg (07/27/2017), 850 mg (08/10/2017), 850 mg (08/24/2017), 850 mg (09/07/2017), 700 mg (09/21/2017), 700 mg (10/05/2017), 700 mg (10/19/2017), 700 mg (11/02/2017), 700 mg (11/16/2017), 700 mg (11/30/2017), 700 mg (12/14/2017), 700 mg (12/28/2017), 700 mg (01/12/2018), 700 mg (01/26/2018), 700 mg (02/09/2018), 650 mg (02/24/2018), 650 mg (03/10/2018), 650 mg (03/29/2018), 650 mg (04/12/2018), 650 mg (04/26/2018), 650 mg (05/10/2018), 650 mg (05/24/2018), 650 mg (06/07/2018), 650 mg (06/21/2018), 650 mg (07/05/2018), 650 mg (07/19/2018), 650 mg (08/03/2018), 650 mg (08/18/2018), 650 mg (09/01/2018), 650 mg (09/15/2018), 650 mg (09/29/2018), 650 mg (10/13/2018), 650 mg (10/27/2018), 650 mg (11/10/2018), 650 mg (11/24/2018), 650 mg (12/08/2018), 650 mg (12/22/2018), 650 mg (01/05/2019),  650 mg (01/19/2019) fosaprepitant (EMEND) 150 mg in sodium chloride 0.9 % 145 mL IVPB, , Intravenous,  Once, 5 of 6 cycles Administration:  (11/24/2018),  (12/08/2018),  (12/22/2018),  (01/05/2019),  (01/19/2019) fluorouracil (ADRUCIL) 5,250 mg in sodium chloride 0.9 % 145 mL chemo infusion, 2,400 mg/m2 = 5,250 mg, Intravenous, 1 Day/Dose, 39 of 40 cycles Dose modification: 1,920 mg/m2 (80 % of original dose 2,400 mg/m2, Cycle 5, Reason: Provider Judgment) Administration: 5,250 mg (07/27/2017), 5,250 mg (08/10/2017), 5,250 mg (08/24/2017), 5,250 mg (09/07/2017), 4,200 mg (09/21/2017), 4,200 mg (10/05/2017), 4,200 mg (10/19/2017), 4,200 mg (11/02/2017), 4,200 mg (11/16/2017), 4,200 mg (11/30/2017), 4,200 mg (12/14/2017), 4,200 mg (12/28/2017), 4,200 mg (01/12/2018), 4,200 mg (01/26/2018), 4,200 mg (02/09/2018), 3,950 mg (02/24/2018), 3,950 mg (03/10/2018), 3,950 mg (03/29/2018), 3,950 mg (04/12/2018), 3,950 mg (04/26/2018), 3,950 mg (05/10/2018), 3,950 mg (05/24/2018), 3,950 mg (06/07/2018), 3,950 mg (06/21/2018), 3,950 mg (07/05/2018), 3,950 mg (07/19/2018), 3,950 mg (08/03/2018), 3,950 mg (08/18/2018), 3,950 mg (09/01/2018), 3,950 mg (09/15/2018), 3,950 mg (09/29/2018), 3,950 mg (10/13/2018), 3,950 mg (10/27/2018), 3,950 mg (11/10/2018), 3,950 mg (11/24/2018), 3,950 mg (12/08/2018), 3,950 mg (12/22/2018), 3,950 mg (01/05/2019), 3,950 mg (01/19/2019) bevacizumab-bvzr (ZIRABEV) 400 mg in sodium chloride 0.9 % 100 mL chemo infusion, 5 mg/kg = 400 mg (100 % of original dose 5 mg/kg), Intravenous,  Once, 4 of 5 cycles Dose modification: 5 mg/kg (original dose 5 mg/kg, Cycle 36) Administration: 400 mg (12/08/2018), 400 mg (12/22/2018), 400 mg (01/05/2019), 400 mg (01/19/2019)  for chemotherapy treatment.       CANCER STAGING: Cancer Staging Malignant neoplasm of transverse colon [facility]) Staging form: Colon and Rectum, AJCC 8th Edition - Clinical: cT3, cN1a, cM1 - Signed by Twana First, MD on  03/09/2017 - Pathologic: No stage assigned -  Unsigned    INTERVAL HISTORY:  Mr. Pasquarello 85 y.o. male seen for follow-up of stage IV colon cancer.  He is receiving 5-FU and bevacizumab maintenance.  Denies any nosebleeds, hematuria, bleeding per rectum or melena.  He had diarrhea for 1 day which was controlled with Lomotil.  He got constipated after taking Lomotil.  Appetite is 100%.  Energy levels are 75%.  He did have soreness on the side of the tongue but without any clear ulceration.  Numbness in the hands and feet has been stable.  He also felt nauseous when his blood sugars were high.  REVIEW OF SYSTEMS:  Review of Systems  HENT:   Positive for mouth sores.   Gastrointestinal: Positive for diarrhea and nausea.  Neurological: Positive for numbness.  All other systems reviewed and are negative.    PAST MEDICAL/SURGICAL HISTORY:  Past Medical History:  Diagnosis Date   Chronic pulmonary embolism ([place]) 15/7262   compliction of the valve replacement surgery   COPD (chronic obstructive pulmonary disease) ([place])    Diabetes ([place]) 04/22/2016   H/O aortic valve replacement 10/2008   Hypercholesteremia    Hypertension    Pulmonary emboli ([place]) 10/2008   Sleep apnea    Past Surgical History:  Procedure Laterality Date   AORTIC VALVE REPLACEMENT     2010   CARDIAC CATHETERIZATION  2010   COLONOSCOPY N/A 06/05/2016   Procedure: COLONOSCOPY;  Surgeon: Rogene Houston, MD;  Location: [facility] ENDO SUITE;  Service: Endoscopy;  Laterality: N/A;   PARTIAL COLECTOMY N/A 07/11/2016   Procedure: PARTIAL COLECTOMY;  Surgeon: Aviva Signs, MD;  Location: [facility] ORS;  Service: General;  Laterality: N/A;   PORTACATH PLACEMENT N/A 08/13/2016   Procedure: INSERTION PORT-A-CATH LEFT SUBCLAVIAN;  Surgeon: Aviva Signs, MD;  Location: [facility] ORS;  Service: General;  Laterality: N/A;   REPLACEMENT TOTAL KNEE     1996   spenectomy     from trauma     SOCIAL HISTORY:  Social History   Socioeconomic History   Marital status: Widowed     Spouse name: Not on file   Number of children: Not on file   Years of education: Not on file   Highest education level: Not on file  Occupational History   Not on file  Social Needs   Financial resource strain: Not on file   Food insecurity    Worry: Not on file    Inability: Not on file   Transportation needs    Medical: Not on file    Non-medical: Not on file  Tobacco Use   Smoking status: Former Smoker    Years: 15.00    Types: Cigarettes    Quit date: 1964    Years since quitting: 56.8   Smokeless tobacco: Never Used  Substance and Sexual Activity   Alcohol use: No   Drug use: No   Sexual activity: Yes  Lifestyle   Physical activity    Days per week: Not on file    Minutes per session: Not on file   Stress: Not on file  Relationships   Social connections    Talks on phone: Not on file    Gets together: Not on file    Attends religious service: Not on file    Active member of club or organization: Not on file    Attends meetings of clubs or organizations: Not on file    Relationship status: Not on file  Intimate partner violence    Fear of current or ex partner: Not on file    Emotionally abused: Not on file    Physically abused: Not on file    Forced sexual activity: Not on file  Other Topics Concern   Not on file  Social History Narrative   Not on file    FAMILY HISTORY:  Family History  Problem Relation Age of Onset   Colon cancer Neg Hx     CURRENT MEDICATIONS:  Outpatient Encounter Medications as of 01/19/2019  Medication Sig Note   atorvastatin (LIPITOR) 10 MG tablet Take 10 mg by mouth at bedtime.     Cholecalciferol (VITAMIN D3) 5000 units CAPS Take 5,000 Units by mouth daily.     lisinopril (PRINIVIL,ZESTRIL) 10 MG tablet Take 10 mg by mouth daily.    metoprolol (LOPRESSOR) 50 MG tablet Take 50 mg by mouth 2 (two) times daily.    Multiple Vitamins-Minerals (CENTRUM SILVER 50+MEN PO) Take 1 tablet by mouth daily.     RELION GLUCOSE TEST STRIPS test strip USE 1 STRIP TO CHECK GLUCOSE ONCE DAILY    vitamin B-12 (CYANOCOBALAMIN) 1000 MCG tablet Take 1,000 mcg by mouth daily.     diphenoxylate-atropine (LOMOTIL) 2.5-0.025 MG tablet Take 1 tablet by mouth 4 (four) times daily as needed for diarrhea or loose stools. (Patient not taking: Reported on 01/05/2019)    lidocaine-prilocaine (EMLA) cream Apply 1 application topically as needed. (Patient not taking: Reported on 01/05/2019)    NOVOFINE 32G X 6 MM MISC     ondansetron (ZOFRAN ODT) 8 MG disintegrating tablet Take 1 tablet (8 mg total) by mouth every 8 (eight) hours as needed for nausea or vomiting. (Patient not taking: Reported on 01/05/2019)    [DISCONTINUED] prochlorperazine (COMPAZINE) 10 MG tablet Take 1 tablet (10 mg total) by mouth every 6 (six) hours as needed (Nausea or vomiting). 08/05/2016: New Med   Facility-Administered Encounter Medications as of 01/19/2019  Medication   sodium chloride flush (NS) 0.9 % injection 10 mL    ALLERGIES:  Allergies  Allergen Reactions   Amoxicillin Hives    Has patient had a PCN reaction causing immediate rash, facial/tongue/throat swelling, SOB or lightheadedness with hypotension: No Has patient had a PCN reaction causing severe rash involving mucus membranes or skin necrosis: Yes Has patient had a PCN reaction that required hospitalization No Has patient had a PCN reaction occurring within the last 10 years: No If all of the above answers are "NO", then may proceed with Cephalosporin use.    Tamsulosin Hcl     Cant sleep, confusion      PHYSICAL EXAM:  ECOG Performance status: 1  Vitals:   01/19/19 0845 01/19/19 0903  BP: (!) 131/58 (!) 131/58  Pulse: 61 61  Resp: 18 18  Temp: (!) 97.5 F (36.4 C) (!) 97.5 F (36.4 C)  SpO2: 93% 93%   Filed Weights   01/19/19 0845 01/19/19 0903  Weight: 164 lb 3.2 oz (74.5 kg) 164 lb 3.2 oz (74.5 kg)    Physical Exam Vitals signs reviewed.   Constitutional:      Appearance: Normal appearance.  HENT:     Head: Normocephalic.     Nose: Nose normal.     Mouth/Throat:     Mouth: Mucous membranes are moist.     Pharynx: Oropharynx is clear.  Eyes:     Extraocular Movements: Extraocular movements intact.     Conjunctiva/sclera: Conjunctivae normal.  Neck:  Musculoskeletal: Normal range of motion.  Cardiovascular:     Rate and Rhythm: Normal rate and regular rhythm.     Pulses: Normal pulses.     Heart sounds: Normal heart sounds.  Pulmonary:     Effort: Pulmonary effort is normal.     Breath sounds: Normal breath sounds.  Abdominal:     General: Bowel sounds are normal.  Musculoskeletal: Normal range of motion.  Skin:    General: Skin is warm and dry.  Neurological:     General: No focal deficit present.     Mental Status: He is alert and oriented to person, place, and time.  Psychiatric:        Mood and Affect: Mood normal.        Behavior: Behavior normal.        Thought Content: Thought content normal.        Judgment: Judgment normal.      LABORATORY DATA:  I have reviewed the labs as listed.  CBC    Component Value Date/Time   WBC 10.4 01/19/2019 0850   RBC 3.54 (L) 01/19/2019 0850   HGB 12.4 (L) 01/19/2019 0850   HCT 38.8 (L) 01/19/2019 0850   PLT 166 01/19/2019 0850   MCV 109.6 (H) 01/19/2019 0850   MCH 35.0 (H) 01/19/2019 0850   MCHC 32.0 01/19/2019 0850   RDW 14.7 01/19/2019 0850   LYMPHSABS 3.3 01/19/2019 0850   MONOABS 1.5 (H) 01/19/2019 0850   EOSABS 0.4 01/19/2019 0850   BASOSABS 0.1 01/19/2019 0850   CMP Latest Ref Rng & Units 01/19/2019 01/05/2019 12/22/2018  Glucose 70 - 99 mg/dL 168(H) 130(H) 148(H)  BUN 8 - 23 mg/dL '19 17 17  ' Creatinine 0.61 - 1.24 mg/dL 0.91 0.81 0.87  Sodium 135 - 145 mmol/L 137 140 138  Potassium 3.5 - 5.1 mmol/L 4.2 4.1 4.4  Chloride 98 - 111 mmol/L 100 106 103  CO2 22 - 32 mmol/L '27 27 25  ' Calcium 8.9 - 10.3 mg/dL 8.8(L) 9.0 9.3  Total Protein 6.5 -  8.1 g/dL 6.4(L) 6.4(L) 6.8  Total Bilirubin 0.3 - 1.2 mg/dL 0.8 0.6 0.8  Alkaline Phos 38 - 126 U/L 50 42 52  AST 15 - 41 U/L '30 26 29  ' ALT 0 - 44 U/L '24 22 27     ' I have independently reviewed his scans and discussed with the patient.   ASSESSMENT & PLAN:   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[place]) 1.  Stage IV colon cancer (pT3 pN1 M1) MSI high: - K-ras mutation negative, BRAF V600 E+, PIK3CA, TP53, CDKN2A -12 cycles of FOLFOX from 08/26/2016 through 01/27/2017 -Opdivo from 02/23/2017 through 07/13/2017 -FOLFIRI and bevacizumab from 07/27/2017 through 10/13/2018 - CT CAP from 11/08/2018 showed stable disease and abdomen and pelvis.  Mild progression of linear reticulonodular opacities in the lungs likely atypical infectious process. -5-FU and bevacizumab maintenance started on 10/27/2018. -CEA level on 01/05/2019 increased to 9.5 from 7.5. -He tolerated last cycle reasonably well.  He had diarrhea for 1 day.  He had difficulty controlling his blood sugars. -We reviewed his labs today.  He will proceed with his next cycle.  I plan to see him back in 2 weeks for follow-up.  I plan to repeat CT CAP prior to next visit.  2.  Peripheral neuropathy: -He reportedly stopped taking gabapentin.  His tingling and numbness has gotten better.  3.  Diarrhea: -He had diarrhea for 1 day after last chemotherapy.  He will take Lomotil as  needed.  4.  Weight loss: -His weight has been staying stable.  He will continue boost 2 to 3 cans/day.   Total time spent is 25 minutes with more than 50% of the time spent discussing treatment plan, counseling and coordination of care.  Orders placed this encounter:  Orders Placed This Encounter  Procedures   CT Abdomen Pelvis W Contrast   CT Chest W Contrast   CBC with Differential/Platelet   Comprehensive metabolic panel   Magnesium   CEA      [place] [phone]

## 2019-01-19 NOTE — Patient Instructions (Signed)
[facility] at [facility] Discharge Instructions  You were seen today by Dr. Katragadda. He went over your recent lab results. He will repeat your scans prior to your next visit. He will see you back in 2 weeks for labs, treatment and follow up.   Thank you for choosing [facility] at [facility] to provide your oncology and hematology care.  To afford each patient quality time with our provider, please arrive at least 15 minutes before your scheduled appointment time.   If you have a lab appointment with the Cancer Center please come in thru the  Main Entrance and check in at the main information desk  You need to re-schedule your appointment should you arrive 10 or more minutes late.  We strive to give you quality time with our providers, and arriving late affects you and other patients whose appointments are after yours.  Also, if you no show three or more times for appointments you may be dismissed from the clinic at the providers discretion.     Again, thank you for choosing [facility].  Our hope is that these requests will decrease the amount of time that you wait before being seen by our physicians.       _____________________________________________________________  Should you have questions after your visit to [facility], please contact our office at [phone] between the hours of 8:00 a.m. and 4:30 p.m.  Voicemails left after 4:00 p.m. will not be returned until the following business day.  For prescription refill requests, have your pharmacy contact our office and allow 72 hours.    Cancer Center Support Programs:   > Cancer Support Group  2nd Tuesday of the month 1pm-2pm, Journey Room    

## 2019-01-19 NOTE — Progress Notes (Signed)
Labs reviewed with MD today at office visit. Will proceed with treatment as planned.   Treatment given per orders. Patient tolerated it well without problems. Vitals stable and discharged home from clinic ambulatory. Follow up as scheduled.

## 2019-01-19 NOTE — Patient Instructions (Signed)
[facility] Discharge Instructions for Patients Receiving Chemotherapy  Today you received the following chemotherapy agents   To help prevent nausea and vomiting after your treatment, we encourage you to take your nausea medication   If you develop nausea and vomiting that is not controlled by your nausea medication, call the clinic.   BELOW ARE SYMPTOMS THAT SHOULD BE REPORTED IMMEDIATELY:  *FEVER GREATER THAN 100.5 F  *CHILLS WITH OR WITHOUT FEVER  NAUSEA AND VOMITING THAT IS NOT CONTROLLED WITH YOUR NAUSEA MEDICATION  *UNUSUAL SHORTNESS OF BREATH  *UNUSUAL BRUISING OR BLEEDING  TENDERNESS IN MOUTH AND THROAT WITH OR WITHOUT PRESENCE OF ULCERS  *URINARY PROBLEMS  *BOWEL PROBLEMS  UNUSUAL RASH Items with * indicate a potential emergency and should be followed up as soon as possible.  Feel free to call the clinic should you have any questions or concerns. The clinic phone number is [phone].  Please show the CHEMO ALERT CARD at check-in to the Emergency Department and triage nurse.   

## 2019-01-20 ENCOUNTER — Encounter (HOSPITAL_COMMUNITY): Payer: Self-pay | Admitting: Hematology

## 2019-01-20 LAB — CEA: CEA: 9.6 ng/mL — ABNORMAL HIGH (ref 0.0–4.7)

## 2019-01-20 NOTE — Assessment & Plan Note (Signed)
1.  Stage IV colon cancer (pT3 pN1 M1) MSI high: - K-ras mutation negative, BRAF V600 E+, PIK3CA, TP53, CDKN2A -12 cycles of FOLFOX from 08/26/2016 through 01/27/2017 -Opdivo from 02/23/2017 through 07/13/2017 -FOLFIRI and bevacizumab from 07/27/2017 through 10/13/2018 - CT CAP from 11/08/2018 showed stable disease and abdomen and pelvis.  Mild progression of linear reticulonodular opacities in the lungs likely atypical infectious process. -5-FU and bevacizumab maintenance started on 10/27/2018. -CEA level on 01/05/2019 increased to 9.5 from 7.5. -He tolerated last cycle reasonably well.  He had diarrhea for 1 day.  He had difficulty controlling his blood sugars. -We reviewed his labs today.  He will proceed with his next cycle.  I plan to see him back in 2 weeks for follow-up.  I plan to repeat CT CAP prior to next visit.  2.  Peripheral neuropathy: -He reportedly stopped taking gabapentin.  His tingling and numbness has gotten better.  3.  Diarrhea: -He had diarrhea for 1 day after last chemotherapy.  He will take Lomotil as needed.  4.  Weight loss: -His weight has been staying stable.  He will continue boost 2 to 3 cans/day.

## 2019-01-21 ENCOUNTER — Other Ambulatory Visit: Payer: Self-pay

## 2019-01-21 ENCOUNTER — Inpatient Hospital Stay (HOSPITAL_COMMUNITY): Payer: Medicare Other

## 2019-01-21 VITALS — BP 149/70 | HR 52 | Temp 97.1°F | Resp 18

## 2019-01-21 DIAGNOSIS — C184 Malignant neoplasm of transverse colon: Secondary | ICD-10-CM

## 2019-01-21 DIAGNOSIS — Z95828 Presence of other vascular implants and grafts: Secondary | ICD-10-CM

## 2019-01-21 DIAGNOSIS — Z5111 Encounter for antineoplastic chemotherapy: Secondary | ICD-10-CM | POA: Diagnosis not present

## 2019-01-21 MED ORDER — HEPARIN SOD (PORK) LOCK FLUSH 100 UNIT/ML IV SOLN
500.0000 [IU] | Freq: Once | INTRAVENOUS | Status: AC | PRN
  Administered 2019-01-21: 500 [IU]

## 2019-01-21 MED ORDER — SODIUM CHLORIDE 0.9% FLUSH
10.0000 mL | INTRAVENOUS | Status: DC | PRN
  Administered 2019-01-21: 10 mL
  Filled 2019-01-21: qty 10

## 2019-01-21 NOTE — Patient Instructions (Signed)
[facility] at [facility]  Discharge Instructions:   _______________________________________________________________  Thank you for choosing [facility] at [facility] to provide your oncology and hematology care.  To afford each patient quality time with our providers, please arrive at least 15 minutes before your scheduled appointment.  You need to re-schedule your appointment if you arrive 10 or more minutes late.  We strive to give you quality time with our providers, and arriving late affects you and other patients whose appointments are after yours.  Also, if you no show three or more times for appointments you may be dismissed from the clinic.  Again, thank you for choosing [facility] at [facility]. Our hope is that these requests will allow you access to exceptional care and in a timely manner. _______________________________________________________________  If you have questions after your visit, please contact our office at [phone] between the hours of 8:30 a.m. and 5:00 p.m. Voicemails left after 4:30 p.m. will not be returned until the following business day. _______________________________________________________________  For prescription refill requests, have your pharmacy contact our office. _______________________________________________________________  Recommendations made by the consultant and any test results will be sent to your referring physician. _______________________________________________________________ 

## 2019-01-21 NOTE — Progress Notes (Signed)
Kevin Kirk returns today for port de access and flush after 46 hr continous infusion of 5fu. Tolerated infusion without problems. Portacath located left chest wall was  deaccessed and flushed with 20ml NS and 500U/5ml Heparin and needle removed intact.  Procedure without incident. Patient tolerated procedure well.  Vitals stable and discharged home from clinic ambulatory. Follow up as scheduled.    

## 2019-02-01 ENCOUNTER — Ambulatory Visit (HOSPITAL_COMMUNITY)
Admission: RE | Admit: 2019-02-01 | Discharge: 2019-02-01 | Disposition: A | Discharge status: Home / Self Care | Payer: Medicare Other | Source: Ambulatory Visit | Attending: Hematology | Admitting: Hematology

## 2019-02-01 ENCOUNTER — Other Ambulatory Visit: Payer: Self-pay

## 2019-02-01 ENCOUNTER — Inpatient Hospital Stay (HOSPITAL_COMMUNITY): Payer: Medicare Other | Attending: Hematology

## 2019-02-01 DIAGNOSIS — Z79899 Other long term (current) drug therapy: Secondary | ICD-10-CM | POA: Insufficient documentation

## 2019-02-01 DIAGNOSIS — R634 Abnormal weight loss: Secondary | ICD-10-CM | POA: Insufficient documentation

## 2019-02-01 DIAGNOSIS — E1142 Type 2 diabetes mellitus with diabetic polyneuropathy: Secondary | ICD-10-CM | POA: Insufficient documentation

## 2019-02-01 DIAGNOSIS — C184 Malignant neoplasm of transverse colon: Secondary | ICD-10-CM | POA: Insufficient documentation

## 2019-02-01 DIAGNOSIS — Z23 Encounter for immunization: Secondary | ICD-10-CM | POA: Insufficient documentation

## 2019-02-01 DIAGNOSIS — G629 Polyneuropathy, unspecified: Secondary | ICD-10-CM | POA: Insufficient documentation

## 2019-02-01 DIAGNOSIS — R197 Diarrhea, unspecified: Secondary | ICD-10-CM | POA: Insufficient documentation

## 2019-02-01 DIAGNOSIS — Z5112 Encounter for antineoplastic immunotherapy: Secondary | ICD-10-CM | POA: Insufficient documentation

## 2019-02-01 DIAGNOSIS — Z87891 Personal history of nicotine dependence: Secondary | ICD-10-CM | POA: Insufficient documentation

## 2019-02-01 MED ORDER — IOHEXOL 300 MG/ML  SOLN
100.0000 mL | Freq: Once | INTRAMUSCULAR | Status: AC | PRN
  Administered 2019-02-01: 100 mL via INTRAVENOUS

## 2019-02-02 ENCOUNTER — Inpatient Hospital Stay (HOSPITAL_COMMUNITY): Payer: Medicare Other

## 2019-02-02 ENCOUNTER — Encounter (HOSPITAL_COMMUNITY): Payer: Self-pay | Admitting: Hematology

## 2019-02-02 ENCOUNTER — Encounter (HOSPITAL_COMMUNITY): Payer: Self-pay

## 2019-02-02 ENCOUNTER — Inpatient Hospital Stay (HOSPITAL_BASED_OUTPATIENT_CLINIC_OR_DEPARTMENT_OTHER): Payer: Medicare Other | Admitting: Hematology

## 2019-02-02 VITALS — BP 143/69 | HR 53 | Temp 96.5°F | Resp 18

## 2019-02-02 DIAGNOSIS — G629 Polyneuropathy, unspecified: Secondary | ICD-10-CM | POA: Diagnosis not present

## 2019-02-02 DIAGNOSIS — Z79899 Other long term (current) drug therapy: Secondary | ICD-10-CM | POA: Diagnosis not present

## 2019-02-02 DIAGNOSIS — R634 Abnormal weight loss: Secondary | ICD-10-CM | POA: Diagnosis not present

## 2019-02-02 DIAGNOSIS — C184 Malignant neoplasm of transverse colon: Secondary | ICD-10-CM

## 2019-02-02 DIAGNOSIS — Z87891 Personal history of nicotine dependence: Secondary | ICD-10-CM | POA: Diagnosis not present

## 2019-02-02 DIAGNOSIS — E1142 Type 2 diabetes mellitus with diabetic polyneuropathy: Secondary | ICD-10-CM | POA: Diagnosis not present

## 2019-02-02 DIAGNOSIS — Z95828 Presence of other vascular implants and grafts: Secondary | ICD-10-CM

## 2019-02-02 DIAGNOSIS — Z23 Encounter for immunization: Secondary | ICD-10-CM | POA: Diagnosis not present

## 2019-02-02 DIAGNOSIS — R197 Diarrhea, unspecified: Secondary | ICD-10-CM | POA: Diagnosis not present

## 2019-02-02 DIAGNOSIS — Z5112 Encounter for antineoplastic immunotherapy: Secondary | ICD-10-CM | POA: Diagnosis not present

## 2019-02-02 LAB — COMPREHENSIVE METABOLIC PANEL
ALT: 23 U/L (ref 0–44)
AST: 23 U/L (ref 15–41)
Albumin: 3.7 g/dL (ref 3.5–5.0)
Alkaline Phosphatase: 46 U/L (ref 38–126)
Anion gap: 8 (ref 5–15)
BUN: 13 mg/dL (ref 8–23)
CO2: 28 mmol/L (ref 22–32)
Calcium: 8.9 mg/dL (ref 8.9–10.3)
Chloride: 101 mmol/L (ref 98–111)
Creatinine, Ser: 0.83 mg/dL (ref 0.61–1.24)
GFR calc Af Amer: >60 mL/min (ref >60)
GFR calc non Af Amer: >60 mL/min (ref >60)
Glucose, Bld: 119 mg/dL — ABNORMAL HIGH (ref 70–99)
Potassium: 4.2 mmol/L (ref 3.5–5.1)
Sodium: 137 mmol/L (ref 135–145)
Total Bilirubin: 0.8 mg/dL (ref 0.3–1.2)
Total Protein: 6.3 g/dL — ABNORMAL LOW (ref 6.5–8.1)

## 2019-02-02 LAB — CBC WITH DIFFERENTIAL/PLATELET
Abs Immature Granulocytes: 0.02 10*3/uL (ref 0.00–0.07)
Basophils Absolute: 0.1 10*3/uL (ref 0.0–0.1)
Basophils Relative: 1 %
Eosinophils Absolute: 0.4 10*3/uL (ref 0.0–0.5)
Eosinophils Relative: 4 %
HCT: 40.3 % (ref 39.0–52.0)
Hemoglobin: 12.8 g/dL — ABNORMAL LOW (ref 13.0–17.0)
Immature Granulocytes: 0 %
Lymphocytes Relative: 34 %
Lymphs Abs: 3.5 10*3/uL (ref 0.7–4.0)
MCH: 34.9 pg — ABNORMAL HIGH (ref 26.0–34.0)
MCHC: 31.8 g/dL (ref 30.0–36.0)
MCV: 109.8 fL — ABNORMAL HIGH (ref 80.0–100.0)
Monocytes Absolute: 1.4 10*3/uL — ABNORMAL HIGH (ref 0.1–1.0)
Monocytes Relative: 13 %
Neutro Abs: 5.1 10*3/uL (ref 1.7–7.7)
Neutrophils Relative %: 48 %
Platelets: 165 10*3/uL (ref 150–400)
RBC: 3.67 MIL/uL — ABNORMAL LOW (ref 4.22–5.81)
RDW: 14.7 % (ref 11.5–15.5)
WBC: 10.5 10*3/uL (ref 4.0–10.5)
nRBC: 0.3 % — ABNORMAL HIGH (ref 0.0–0.2)

## 2019-02-02 LAB — URINALYSIS, DIPSTICK ONLY
Bilirubin Urine: NEGATIVE
Glucose, UA: NEGATIVE mg/dL
Hgb urine dipstick: NEGATIVE
Ketones, ur: NEGATIVE mg/dL
Nitrite: NEGATIVE
Protein, ur: NEGATIVE mg/dL
Specific Gravity, Urine: 1.011 (ref 1.005–1.030)
pH: 6 (ref 5.0–8.0)

## 2019-02-02 LAB — MAGNESIUM: Magnesium: 2.2 mg/dL (ref 1.7–2.4)

## 2019-02-02 MED ORDER — PALONOSETRON HCL INJECTION 0.25 MG/5ML
0.2500 mg | Freq: Once | INTRAVENOUS | Status: AC
  Administered 2019-02-02: 0.25 mg via INTRAVENOUS
  Filled 2019-02-02: qty 5

## 2019-02-02 MED ORDER — SODIUM CHLORIDE 0.9% FLUSH: 10.0000 mL | INTRAVENOUS | Status: DC | PRN

## 2019-02-02 MED ORDER — SODIUM CHLORIDE 0.9 % IV SOLN
10.0000 mg | Freq: Once | INTRAVENOUS | Status: AC
  Administered 2019-02-02: 10 mg via INTRAVENOUS
  Filled 2019-02-02: qty 10

## 2019-02-02 MED ORDER — SODIUM CHLORIDE 0.9 % IV SOLN
Freq: Once | INTRAVENOUS | Status: AC
  Administered 2019-02-02: 12:00:00 via INTRAVENOUS

## 2019-02-02 MED ORDER — SODIUM CHLORIDE 0.9 % IV SOLN
1920.0000 mg/m2 | INTRAVENOUS | Status: DC
  Administered 2019-02-02: 3950 mg via INTRAVENOUS
  Filled 2019-02-02: qty 79

## 2019-02-02 MED ORDER — SODIUM CHLORIDE 0.9 % IV SOLN
Freq: Once | INTRAVENOUS | Status: AC
  Administered 2019-02-02: 13:00:00 via INTRAVENOUS
  Filled 2019-02-02: qty 5

## 2019-02-02 MED ORDER — FLUOROURACIL CHEMO INJECTION 2.5 GM/50ML
320.0000 mg/m2 | Freq: Once | INTRAVENOUS | Status: AC
  Administered 2019-02-02: 650 mg via INTRAVENOUS
  Filled 2019-02-02: qty 13

## 2019-02-02 MED ORDER — SODIUM CHLORIDE 0.9 % IV SOLN
700.0000 mg | Freq: Once | INTRAVENOUS | Status: AC
  Administered 2019-02-02: 700 mg via INTRAVENOUS
  Filled 2019-02-02: qty 35

## 2019-02-02 MED ORDER — INFLUENZA VAC A&B SA ADJ QUAD 0.5 ML IM PRSY
0.5000 mL | PREFILLED_SYRINGE | Freq: Once | INTRAMUSCULAR | Status: AC
  Administered 2019-02-02: 0.5 mL via INTRAMUSCULAR
  Filled 2019-02-02: qty 0.5

## 2019-02-02 MED ORDER — SODIUM CHLORIDE 0.9 % IV SOLN
5.0000 mg/kg | Freq: Once | INTRAVENOUS | Status: AC
  Administered 2019-02-02: 400 mg via INTRAVENOUS
  Filled 2019-02-02: qty 16

## 2019-02-02 NOTE — Progress Notes (Signed)
Pt presents today for treatment and f/u visit with Dr. Delton Coombes. MAR reviewed. Labs pending. Pt has no complaints of any pain or changes since the last visit.   Message received from [facility] LPN to proceed with treatment today. Labs reviewed by Dr. Delton Coombes.   Treatment given today per MD orders. Tolerated infusion without adverse affects. Vital signs stable. No complaints at this time. 5FU pump infusion per protocol. RUN noted on the screen.  Discharged from clinic ambulatory. F/U with [facility] as scheduled.

## 2019-02-02 NOTE — Patient Instructions (Addendum)
[facility] at [facility] Discharge Instructions  You were seen today by Dr. Katragadda. He went over your recent lab results. He will see you back in 2 weeks for labs, treatment and follow up.   Thank you for choosing  Cancer Center at [facility] to provide your oncology and hematology care.  To afford each patient quality time with our provider, please arrive at least 15 minutes before your scheduled appointment time.   If you have a lab appointment with the Cancer Center please come in thru the  Main Entrance and check in at the main information desk  You need to re-schedule your appointment should you arrive 10 or more minutes late.  We strive to give you quality time with our providers, and arriving late affects you and other patients whose appointments are after yours.  Also, if you no show three or more times for appointments you may be dismissed from the clinic at the providers discretion.     Again, thank you for choosing [facility].  Our hope is that these requests will decrease the amount of time that you wait before being seen by our physicians.       _____________________________________________________________  Should you have questions after your visit to [facility], please contact our office at [phone] between the hours of 8:00 a.m. and 4:30 p.m.  Voicemails left after 4:00 p.m. will not be returned until the following business day.  For prescription refill requests, have your pharmacy contact our office and allow 72 hours.    Cancer Center Support Programs:   > Cancer Support Group  2nd Tuesday of the month 1pm-2pm, Journey Room    

## 2019-02-02 NOTE — Progress Notes (Signed)
[place] [place], [place] [address]   CLINIC:  Medical Oncology/Hematology  PCP:  Tobe Sos, MD [address] 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[place])   07/27/2017 -  Chemotherapy   The patient had palonosetron (ALOXI) injection 0.25 mg, 0.25 mg, Intravenous,  Once, 40 of 43 cycles Administration: 0.25 mg (07/27/2017), 0.25 mg (08/10/2017), 0.25 mg (08/24/2017), 0.25 mg (09/07/2017), 0.25 mg (09/21/2017), 0.25 mg (10/05/2017), 0.25 mg (10/19/2017), 0.25 mg (11/02/2017), 0.25 mg (11/16/2017), 0.25 mg (11/30/2017), 0.25 mg (12/14/2017), 0.25 mg (12/28/2017), 0.25 mg (01/12/2018), 0.25 mg (01/26/2018), 0.25 mg (02/09/2018), 0.25 mg (02/24/2018), 0.25 mg (03/10/2018), 0.25 mg (03/29/2018), 0.25 mg (04/12/2018), 0.25 mg (04/26/2018), 0.25 mg (05/10/2018), 0.25 mg (05/24/2018), 0.25 mg (06/07/2018), 0.25 mg (06/21/2018), 0.25 mg (07/05/2018), 0.25 mg (07/19/2018), 0.25 mg (08/03/2018), 0.25 mg (08/18/2018), 0.25 mg (09/01/2018), 0.25 mg (09/15/2018), 0.25 mg (09/29/2018), 0.25 mg (10/13/2018), 0.25 mg (10/27/2018), 0.25 mg (11/10/2018), 0.25 mg (11/24/2018), 0.25 mg (12/08/2018), 0.25 mg (12/22/2018), 0.25 mg (01/05/2019), 0.25 mg (01/19/2019), 0.25 mg (02/02/2019) pegfilgrastim-cbqv (UDENYCA) injection 6 mg, 6 mg, Subcutaneous, Once, 7 of 7 cycles Administration: 6 mg (10/07/2017), 6 mg (10/21/2017), 6 mg (11/04/2017), 6 mg (11/18/2017), 6 mg (12/02/2017), 6 mg (12/16/2017), 6 mg (12/30/2017) bevacizumab-bvzr (ZIRABEV) chemo injection 400 mg, 5 mg/kg = 400 mg (100 % of original dose 5 mg/kg), Intravenous,  Once, 2 of 2 cycles Dose modification: 5 mg/kg (original dose 5 mg/kg, Cycle 35) bevacizumab (AVASTIN) 450 mg in sodium chloride 0.9 % 100 mL chemo  infusion, 5 mg/kg = 450 mg, Intravenous,  Once, 35 of 35 cycles Dose modification: 4 mg/kg (80 % of original dose 5 mg/kg, Cycle 5, Reason: Provider Judgment), 5 mg/kg (original dose 5 mg/kg, Cycle 16, Reason: Other (see comments)), 5 mg/kg (original dose 5 mg/kg, Cycle 35) Administration: 450 mg (07/27/2017), 450 mg (08/10/2017), 450 mg (08/24/2017), 450 mg (09/07/2017), 450 mg (09/21/2017), 450 mg (10/05/2017), 450 mg (10/19/2017), 450 mg (11/02/2017), 450 mg (11/16/2017), 450 mg (11/30/2017), 450 mg (12/14/2017), 450 mg (12/28/2017), 400 mg (01/12/2018), 400 mg (01/26/2018), 400 mg (02/09/2018), 400 mg (02/24/2018), 400 mg (03/10/2018), 400 mg (03/29/2018), 400 mg (04/12/2018), 400 mg (04/26/2018), 400 mg (05/10/2018), 400 mg (05/24/2018), 400 mg (06/07/2018), 400 mg (06/21/2018), 400 mg (07/05/2018), 400 mg (07/19/2018), 400 mg (08/03/2018), 400 mg (08/18/2018), 400 mg (09/01/2018), 400 mg (09/15/2018), 400 mg (09/29/2018), 400 mg (10/13/2018), 400 mg (10/27/2018), 400 mg (11/10/2018), 400 mg (11/24/2018) irinotecan (CAMPTOSAR) 400 mg in dextrose 5 % 500 mL chemo infusion, 180 mg/m2 = 400 mg, Intravenous,  Once, 32 of 32 cycles Dose modification: 144 mg/m2 (80 % of original dose 180 mg/m2, Cycle 5, Reason: Provider Judgment), 135 mg/m2 (75 % of original dose 180 mg/m2, Cycle 30, Reason: Other (see comments), Comment: diarrhea) Administration: 400 mg (07/27/2017), 400 mg (08/10/2017), 400 mg (08/24/2017), 400 mg (09/07/2017), 320 mg (09/21/2017), 320 mg (10/05/2017), 320 mg (10/19/2017), 320 mg (11/02/2017), 320 mg (11/16/2017), 320 mg (11/30/2017), 320 mg (12/14/2017), 320 mg (12/28/2017), 320 mg (01/12/2018), 320 mg (01/26/2018), 320 mg (02/09/2018), 300 mg (02/24/2018), 300 mg (03/10/2018), 300 mg (03/29/2018), 300 mg (04/12/2018), 300 mg (04/26/2018), 300 mg (05/10/2018), 300 mg (05/24/2018), 300 mg (06/07/2018), 300 mg (06/21/2018), 300 mg (07/05/2018), 300 mg (07/19/2018), 300 mg (08/03/2018), 300 mg (08/18/2018), 300  mg (09/01/2018), 280 mg (09/15/2018), 280 mg (09/29/2018),  280 mg (10/13/2018) leucovorin 800 mg in dextrose 5 % 250 mL infusion, 872 mg, Intravenous,  Once, 40 of 43 cycles Dose modification: 320 mg/m2 (80 % of original dose 400 mg/m2, Cycle 5, Reason: Provider Judgment) Administration: 800 mg (07/27/2017), 800 mg (08/10/2017), 800 mg (08/24/2017), 800 mg (09/07/2017), 700 mg (09/21/2017), 700 mg (10/05/2017), 700 mg (10/19/2017), 700 mg (11/02/2017), 700 mg (11/16/2017), 700 mg (11/30/2017), 700 mg (12/14/2017), 700 mg (12/28/2017), 700 mg (01/12/2018), 700 mg (01/26/2018), 700 mg (02/09/2018), 700 mg (02/24/2018), 700 mg (03/10/2018), 700 mg (03/29/2018), 700 mg (04/12/2018), 700 mg (04/26/2018), 700 mg (05/10/2018), 700 mg (05/24/2018), 700 mg (06/07/2018), 700 mg (06/21/2018), 700 mg (07/05/2018), 700 mg (07/19/2018), 700 mg (08/03/2018), 700 mg (08/18/2018), 700 mg (09/01/2018), 700 mg (09/15/2018), 700 mg (09/29/2018), 700 mg (10/13/2018), 700 mg (10/27/2018), 700 mg (11/10/2018), 700 mg (11/24/2018), 700 mg (12/08/2018), 700 mg (12/22/2018), 700 mg (01/05/2019), 700 mg (01/19/2019), 700 mg (02/02/2019) fluorouracil (ADRUCIL) chemo injection 850 mg, 400 mg/m2 = 850 mg, Intravenous,  Once, 40 of 43 cycles Dose modification: 320 mg/m2 (80 % of original dose 400 mg/m2, Cycle 5, Reason: Provider Judgment) Administration: 850 mg (07/27/2017), 850 mg (08/10/2017), 850 mg (08/24/2017), 850 mg (09/07/2017), 700 mg (09/21/2017), 700 mg (10/05/2017), 700 mg (10/19/2017), 700 mg (11/02/2017), 700 mg (11/16/2017), 700 mg (11/30/2017), 700 mg (12/14/2017), 700 mg (12/28/2017), 700 mg (01/12/2018), 700 mg (01/26/2018), 700 mg (02/09/2018), 650 mg (02/24/2018), 650 mg (03/10/2018), 650 mg (03/29/2018), 650 mg (04/12/2018), 650 mg (04/26/2018), 650 mg (05/10/2018), 650 mg (05/24/2018), 650 mg (06/07/2018), 650 mg (06/21/2018), 650 mg (07/05/2018), 650 mg (07/19/2018), 650 mg (08/03/2018), 650 mg (08/18/2018), 650 mg (09/01/2018), 650 mg (09/15/2018), 650 mg (09/29/2018), 650 mg (10/13/2018), 650 mg (10/27/2018), 650 mg (11/10/2018), 650 mg (11/24/2018), 650 mg  (12/08/2018), 650 mg (12/22/2018), 650 mg (01/05/2019), 650 mg (01/19/2019), 650 mg (02/02/2019) fosaprepitant (EMEND) 150 mg in sodium chloride 0.9 % 145 mL IVPB, , Intravenous,  Once, 6 of 9 cycles Administration:  (11/24/2018),  (12/08/2018),  (12/22/2018),  (01/05/2019),  (01/19/2019),  (02/02/2019) fluorouracil (ADRUCIL) 5,250 mg in sodium chloride 0.9 % 145 mL chemo infusion, 2,400 mg/m2 = 5,250 mg, Intravenous, 1 Day/Dose, 40 of 43 cycles Dose modification: 1,920 mg/m2 (80 % of original dose 2,400 mg/m2, Cycle 5, Reason: Provider Judgment) Administration: 5,250 mg (07/27/2017), 5,250 mg (08/10/2017), 5,250 mg (08/24/2017), 5,250 mg (09/07/2017), 4,200 mg (09/21/2017), 4,200 mg (10/05/2017), 4,200 mg (10/19/2017), 4,200 mg (11/02/2017), 4,200 mg (11/16/2017), 4,200 mg (11/30/2017), 4,200 mg (12/14/2017), 4,200 mg (12/28/2017), 4,200 mg (01/12/2018), 4,200 mg (01/26/2018), 4,200 mg (02/09/2018), 3,950 mg (02/24/2018), 3,950 mg (03/10/2018), 3,950 mg (03/29/2018), 3,950 mg (04/12/2018), 3,950 mg (04/26/2018), 3,950 mg (05/10/2018), 3,950 mg (05/24/2018), 3,950 mg (06/07/2018), 3,950 mg (06/21/2018), 3,950 mg (07/05/2018), 3,950 mg (07/19/2018), 3,950 mg (08/03/2018), 3,950 mg (08/18/2018), 3,950 mg (09/01/2018), 3,950 mg (09/15/2018), 3,950 mg (09/29/2018), 3,950 mg (10/13/2018), 3,950 mg (10/27/2018), 3,950 mg (11/10/2018), 3,950 mg (11/24/2018), 3,950 mg (12/08/2018), 3,950 mg (12/22/2018), 3,950 mg (01/05/2019), 3,950 mg (01/19/2019), 3,950 mg (02/02/2019) bevacizumab-bvzr (ZIRABEV) 400 mg in sodium chloride 0.9 % 100 mL chemo infusion, 5 mg/kg = 400 mg (100 % of original dose 5 mg/kg), Intravenous,  Once, 5 of 8 cycles Dose modification: 5 mg/kg (original dose 5 mg/kg, Cycle 36) Administration: 400 mg (12/08/2018), 400 mg (12/22/2018), 400 mg (01/05/2019), 400 mg (01/19/2019), 400 mg (02/02/2019)  for chemotherapy treatment.       CANCER STAGING: Cancer Staging Malignant neoplasm of transverse colon [facility]) Staging form: Colon  and Rectum, AJCC  8th Edition - Clinical: cT3, cN1a, cM1 - Signed by Twana First, MD on 03/09/2017 - Pathologic: No stage assigned - Unsigned    INTERVAL HISTORY:  Kevin Kirk 85 y.o. male seen for follow-up of stage IV colon cancer.  Had 1 day of diarrhea after last treatment.  Appetite is 100%.  Energy levels are 75%.  Lost about 2 pounds since last visit.  Denies any bleeding issues.  Denies any fevers, night sweats or hospitalizations.  REVIEW OF SYSTEMS:  Review of Systems  Gastrointestinal: Positive for diarrhea.  Neurological: Positive for numbness.  All other systems reviewed and are negative.    PAST MEDICAL/SURGICAL HISTORY:  Past Medical History:  Diagnosis Date   Chronic pulmonary embolism ([place]) 32/6712   compliction of the valve replacement surgery   COPD (chronic obstructive pulmonary disease) ([place])    Diabetes ([place]) 04/22/2016   H/O aortic valve replacement 10/2008   Hypercholesteremia    Hypertension    Pulmonary emboli ([place]) 10/2008   Sleep apnea    Past Surgical History:  Procedure Laterality Date   AORTIC VALVE REPLACEMENT     2010   CARDIAC CATHETERIZATION  2010   COLONOSCOPY N/A 06/05/2016   Procedure: COLONOSCOPY;  Surgeon: Rogene Houston, MD;  Location: [facility];  Service: Endoscopy;  Laterality: N/A;   PARTIAL COLECTOMY N/A 07/11/2016   Procedure: PARTIAL COLECTOMY;  Surgeon: Aviva Signs, MD;  Location: [facility] ORS;  Service: General;  Laterality: N/A;   PORTACATH PLACEMENT N/A 08/13/2016   Procedure: INSERTION PORT-A-CATH LEFT SUBCLAVIAN;  Surgeon: Aviva Signs, MD;  Location: [facility] ORS;  Service: General;  Laterality: N/A;   REPLACEMENT TOTAL KNEE     1996   spenectomy     from trauma     SOCIAL HISTORY:  Social History   Socioeconomic History   Marital status: Widowed    Spouse name: Not on file   Number of children: Not on file   Years of education: Not on file   Highest education level: Not on file  Occupational History   Not on  file  Social Needs   Financial resource strain: Not on file   Food insecurity    Worry: Not on file    Inability: Not on file   Transportation needs    Medical: Not on file    Non-medical: Not on file  Tobacco Use   Smoking status: Former Smoker    Years: 15.00    Types: Cigarettes    Quit date: 1964    Years since quitting: 56.9   Smokeless tobacco: Never Used  Substance and Sexual Activity   Alcohol use: No   Drug use: No   Sexual activity: Yes  Lifestyle   Physical activity    Days per week: Not on file    Minutes per session: Not on file   Stress: Not on file  Relationships   Social connections    Talks on phone: Not on file    Gets together: Not on file    Attends religious service: Not on file    Active member of club or organization: Not on file    Attends meetings of clubs or organizations: Not on file    Relationship status: Not on file   Intimate partner violence    Fear of current or ex partner: Not on file    Emotionally abused: Not on file    Physically abused: Not on file    Forced sexual activity: Not  on file  Other Topics Concern   Not on file  Social History Narrative   Not on file    FAMILY HISTORY:  Family History  Problem Relation Age of Onset   Colon cancer Neg Hx     CURRENT MEDICATIONS:  Outpatient Encounter Medications as of 02/02/2019  Medication Sig Note   atorvastatin (LIPITOR) 10 MG tablet Take 10 mg by mouth at bedtime.     Cholecalciferol (VITAMIN D3) 5000 units CAPS Take 5,000 Units by mouth daily.     diphenoxylate-atropine (LOMOTIL) 2.5-0.025 MG tablet Take 1 tablet by mouth 4 (four) times daily as needed for diarrhea or loose stools.    lidocaine-prilocaine (EMLA) cream Apply 1 application topically as needed.    lisinopril (PRINIVIL,ZESTRIL) 10 MG tablet Take 10 mg by mouth daily.    metoprolol (LOPRESSOR) 50 MG tablet Take 50 mg by mouth 2 (two) times daily.    Multiple Vitamins-Minerals (CENTRUM  SILVER 50+MEN PO) Take 1 tablet by mouth daily.    NOVOFINE 32G X 6 MM MISC     ondansetron (ZOFRAN ODT) 8 MG disintegrating tablet Take 1 tablet (8 mg total) by mouth every 8 (eight) hours as needed for nausea or vomiting.    RELION GLUCOSE TEST STRIPS test strip USE 1 STRIP TO CHECK GLUCOSE ONCE DAILY    vitamin B-12 (CYANOCOBALAMIN) 1000 MCG tablet Take 1,000 mcg by mouth daily.     [DISCONTINUED] prochlorperazine (COMPAZINE) 10 MG tablet Take 1 tablet (10 mg total) by mouth every 6 (six) hours as needed (Nausea or vomiting). 08/05/2016: New Med   Facility-Administered Encounter Medications as of 02/02/2019  Medication   sodium chloride flush (NS) 0.9 % injection 10 mL    ALLERGIES:  Allergies  Allergen Reactions   Amoxicillin Hives    Has patient had a PCN reaction causing immediate rash, facial/tongue/throat swelling, SOB or lightheadedness with hypotension: No Has patient had a PCN reaction causing severe rash involving mucus membranes or skin necrosis: Yes Has patient had a PCN reaction that required hospitalization No Has patient had a PCN reaction occurring within the last 10 years: No If all of the above answers are "NO", then may proceed with Cephalosporin use.    Tamsulosin Hcl     Cant sleep, confusion      PHYSICAL EXAM:  ECOG Performance status: 1  Vitals:   02/02/19 1126  BP: (!) 154/75  Pulse: (!) 58  Resp: 18  Temp: 97.7 F (36.5 C)  SpO2: 99%   Filed Weights   02/02/19 1126  Weight: 162 lb 9.6 oz (73.8 kg)    Physical Exam Vitals signs reviewed.  Constitutional:      Appearance: Normal appearance.  HENT:     Head: Normocephalic.     Nose: Nose normal.     Mouth/Throat:     Mouth: Mucous membranes are moist.     Pharynx: Oropharynx is clear.  Eyes:     Extraocular Movements: Extraocular movements intact.     Conjunctiva/sclera: Conjunctivae normal.  Neck:     Musculoskeletal: Normal range of motion.  Cardiovascular:     Rate and  Rhythm: Normal rate and regular rhythm.     Pulses: Normal pulses.     Heart sounds: Normal heart sounds.  Pulmonary:     Effort: Pulmonary effort is normal.     Breath sounds: Normal breath sounds.  Abdominal:     General: Bowel sounds are normal.  Musculoskeletal: Normal range of motion.  Skin:  General: Skin is warm and dry.  Neurological:     General: No focal deficit present.     Mental Status: He is alert and oriented to person, place, and time.  Psychiatric:        Mood and Affect: Mood normal.        Behavior: Behavior normal.        Thought Content: Thought content normal.        Judgment: Judgment normal.      LABORATORY DATA:  I have reviewed the labs as listed.  CBC    Component Value Date/Time   WBC 10.5 02/02/2019 1140   RBC 3.67 (L) 02/02/2019 1140   HGB 12.8 (L) 02/02/2019 1140   HCT 40.3 02/02/2019 1140   PLT 165 02/02/2019 1140   MCV 109.8 (H) 02/02/2019 1140   MCH 34.9 (H) 02/02/2019 1140   MCHC 31.8 02/02/2019 1140   RDW 14.7 02/02/2019 1140   LYMPHSABS 3.5 02/02/2019 1140   MONOABS 1.4 (H) 02/02/2019 1140   EOSABS 0.4 02/02/2019 1140   BASOSABS 0.1 02/02/2019 1140   CMP Latest Ref Rng & Units 02/02/2019 01/19/2019 01/05/2019  Glucose 70 - 99 mg/dL 119(H) 168(H) 130(H)  BUN 8 - 23 mg/dL _0 Creatinine 0.61 - 1.24 mg/dL 0.83 0.91 0.81  Sodium 135 - 145 mmol/L 137 137 140  Potassium 3.5 - 5.1 mmol/L 4.2 4.2 4.1  Chloride 98 - 111 mmol/L 101 100 106  CO2 22 - 32 mmol/L _1 Calcium 8.9 - 10.3 mg/dL 8.9 8.8(L) 9.0  Total Protein 6.5 - 8.1 g/dL 6.3(L) 6.4(L) 6.4(L)  Total Bilirubin 0.3 - 1.2 mg/dL 0.8 0.8 0.6  Alkaline Phos 38 - 126 U/L 46 50 42  AST 15 - 41 U/L _2 ALT 0 - 44 U/L _3 I have independently reviewed his scans and discussed with the patient.   ASSESSMENT & PLAN:   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[place]) 1.  Stage IV colon cancer (pT3 pN1 M1) MSI high: - K-ras mutation negative, BRAF V600  E+, PIK3CA, TP53, CDKN2A -12 cycles of FOLFOX from 08/26/2016 through 01/27/2017 -Opdivo from 02/23/2017 through 07/13/2017 -FOLFIRI and bevacizumab from 07/27/2017 through 10/13/2018 - CT CAP from 11/08/2018 showed stable disease and abdomen and pelvis.  Mild progression of linear reticulonodular opacities in the lungs likely atypical infectious process. -5-FU and bevacizumab maintenance started on 10/27/2018. -CEA stable around 9.5. -We discussed results of CT CAP from 02/01/2019.  12 mm short axis subcarinal node improved.  No findings suspicious for metastatic disease in the abdomen or pelvis.  Stable nodularity in the left upper abdomen and right lower quadrant. -Based on stable findings, we have recommended continuation of maintenance 5-FU and bevacizumab as long as tolerated. -We reviewed labs.  He will proceed with next cycle today.  2.  Peripheral neuropathy: -He reportedly stopped taking gabapentin.  His tingling and numbness has gotten better.  3.  Diarrhea: -He had diarrhea for 1 day after last chemotherapy.  He will take Lomotil as needed.  4.  Weight loss: -His weight has been staying stable.  He will continue boost 2 to 3 cans/day.   Total time spent is 25 minutes with more than 50% of the time spent discussing treatment plan, counseling and coordination of care.  Orders placed this encounter:  No orders of the defined types were placed in this encounter.     [facility] [phone]

## 2019-02-02 NOTE — Patient Instructions (Addendum)
[facility] Discharge Instructions for Patients Receiving Chemotherapy  Today you received the following chemotherapy agents   To help prevent nausea and vomiting after your treatment, we encourage you to take your nausea medication    If you develop nausea and vomiting that is not controlled by your nausea medication, call the clinic.   BELOW ARE SYMPTOMS THAT SHOULD BE REPORTED IMMEDIATELY:  *FEVER GREATER THAN 100.5 F  *CHILLS WITH OR WITHOUT FEVER  NAUSEA AND VOMITING THAT IS NOT CONTROLLED WITH YOUR NAUSEA MEDICATION  *UNUSUAL SHORTNESS OF BREATH  *UNUSUAL BRUISING OR BLEEDING  TENDERNESS IN MOUTH AND THROAT WITH OR WITHOUT PRESENCE OF ULCERS  *URINARY PROBLEMS  *BOWEL PROBLEMS  UNUSUAL RASH Items with * indicate a potential emergency and should be followed up as soon as possible.  Feel free to call the clinic should you have any questions or concerns. The clinic phone number is [phone].  Please show the CHEMO ALERT CARD at check-in to the Emergency Department and triage nurse.  [facility] Discharge Instructions for Patients Receiving Chemotherapy  Today you received the following chemotherapy agents   To help prevent nausea and vomiting after your treatment, we encourage you to take your nausea medication    If you develop nausea and vomiting that is not controlled by your nausea medication, call the clinic.   BELOW ARE SYMPTOMS THAT SHOULD BE REPORTED IMMEDIATELY: *FEVER GREATER THAN 100.5 F *CHILLS WITH OR WITHOUT FEVER NAUSEA AND VOMITING THAT IS NOT CONTROLLED WITH YOUR NAUSEA MEDICATION *UNUSUAL SHORTNESS OF BREATH *UNUSUAL BRUISING OR BLEEDING TENDERNESS IN MOUTH AND THROAT WITH OR WITHOUT PRESENCE OF ULCERS *URINARY PROBLEMS *BOWEL PROBLEMS UNUSUAL RASH Items with * indicate a potential emergency and should be followed up as soon as possible.  Feel free to call the clinic should you have any questions or concerns.  The clinic phone number is [phone].  Please show the CHEMO ALERT CARD at check-in to the Emergency Department and triage nurse.   

## 2019-02-02 NOTE — Progress Notes (Signed)
Labs reviewed with MD today at office visit. Proceed as planned.

## 2019-02-02 NOTE — Assessment & Plan Note (Signed)
1.  Stage IV colon cancer (pT3 pN1 M1) MSI high: - K-ras mutation negative, BRAF V600 E+, PIK3CA, TP53, CDKN2A -12 cycles of FOLFOX from 08/26/2016 through 01/27/2017 -Opdivo from 02/23/2017 through 07/13/2017 -FOLFIRI and bevacizumab from 07/27/2017 through 10/13/2018 - CT CAP from 11/08/2018 showed stable disease and abdomen and pelvis.  Mild progression of linear reticulonodular opacities in the lungs likely atypical infectious process. -5-FU and bevacizumab maintenance started on 10/27/2018. -CEA stable around 9.5. -We discussed results of CT CAP from 02/01/2019.  12 mm short axis subcarinal node improved.  No findings suspicious for metastatic disease in the abdomen or pelvis.  Stable nodularity in the left upper abdomen and right lower quadrant. -Based on stable findings, we have recommended continuation of maintenance 5-FU and bevacizumab as long as tolerated. -We reviewed labs.  He will proceed with next cycle today.  2.  Peripheral neuropathy: -He reportedly stopped taking gabapentin.  His tingling and numbness has gotten better.  3.  Diarrhea: -He had diarrhea for 1 day after last chemotherapy.  He will take Lomotil as needed.  4.  Weight loss: -His weight has been staying stable.  He will continue boost 2 to 3 cans/day.

## 2019-02-04 ENCOUNTER — Other Ambulatory Visit: Payer: Self-pay

## 2019-02-04 ENCOUNTER — Inpatient Hospital Stay (HOSPITAL_COMMUNITY): Payer: Medicare Other

## 2019-02-04 VITALS — BP 123/70 | HR 58 | Temp 97.1°F | Resp 18

## 2019-02-04 DIAGNOSIS — Z5112 Encounter for antineoplastic immunotherapy: Secondary | ICD-10-CM | POA: Diagnosis not present

## 2019-02-04 DIAGNOSIS — C184 Malignant neoplasm of transverse colon: Secondary | ICD-10-CM

## 2019-02-04 DIAGNOSIS — Z95828 Presence of other vascular implants and grafts: Secondary | ICD-10-CM

## 2019-02-04 MED ORDER — HEPARIN SOD (PORK) LOCK FLUSH 100 UNIT/ML IV SOLN
500.0000 [IU] | Freq: Once | INTRAVENOUS | Status: AC | PRN
  Administered 2019-02-04: 500 [IU]

## 2019-02-04 MED ORDER — PEGFILGRASTIM-JMDB 6 MG/0.6ML ~~LOC~~ SOSY
PREFILLED_SYRINGE | SUBCUTANEOUS | Status: AC
  Filled 2019-02-04: qty 1.2

## 2019-02-04 MED ORDER — SODIUM CHLORIDE 0.9% FLUSH
10.0000 mL | INTRAVENOUS | Status: DC | PRN
  Administered 2019-02-04: 10 mL
  Filled 2019-02-04: qty 10

## 2019-02-04 MED ORDER — PEGFILGRASTIM INJECTION 6 MG/0.6ML ~~LOC~~
PREFILLED_SYRINGE | SUBCUTANEOUS | Status: AC
  Filled 2019-02-04: qty 0.6

## 2019-02-04 NOTE — Patient Instructions (Signed)
[facility] Discharge Instructions for Patients Receiving Chemotherapy  Today you received the following chemotherapy agents   To help prevent nausea and vomiting after your treatment, we encourage you to take your nausea medication   If you develop nausea and vomiting that is not controlled by your nausea medication, call the clinic.   BELOW ARE SYMPTOMS THAT SHOULD BE REPORTED IMMEDIATELY:  *FEVER GREATER THAN 100.5 F  *CHILLS WITH OR WITHOUT FEVER  NAUSEA AND VOMITING THAT IS NOT CONTROLLED WITH YOUR NAUSEA MEDICATION  *UNUSUAL SHORTNESS OF BREATH  *UNUSUAL BRUISING OR BLEEDING  TENDERNESS IN MOUTH AND THROAT WITH OR WITHOUT PRESENCE OF ULCERS  *URINARY PROBLEMS  *BOWEL PROBLEMS  UNUSUAL RASH Items with * indicate a potential emergency and should be followed up as soon as possible.  Feel free to call the clinic should you have any questions or concerns. The clinic phone number is [phone].  Please show the CHEMO ALERT CARD at check-in to the Emergency Department and triage nurse.   

## 2019-02-04 NOTE — Progress Notes (Signed)
  Kevin Kirk presented for pump d/c and portacath  flush. Portacath located in the left chest wall. Clean, Dry and Intact Good blood return present. Portacath flushed with 27ml NS and 500U/50ml Heparin per protocol and needle removed intact. Procedure without incident. Patient tolerated procedure well.  No complaints at this time. Discharged from clinic ambulatory. F/U with [facility] as scheduled.

## 2019-02-08 ENCOUNTER — Other Ambulatory Visit (HOSPITAL_COMMUNITY): Payer: Self-pay | Admitting: *Deleted

## 2019-02-08 DIAGNOSIS — C184 Malignant neoplasm of transverse colon: Secondary | ICD-10-CM

## 2019-02-21 ENCOUNTER — Other Ambulatory Visit: Payer: Self-pay

## 2019-02-22 ENCOUNTER — Inpatient Hospital Stay (HOSPITAL_BASED_OUTPATIENT_CLINIC_OR_DEPARTMENT_OTHER): Payer: Medicare Other | Admitting: Hematology

## 2019-02-22 ENCOUNTER — Inpatient Hospital Stay (HOSPITAL_COMMUNITY): Payer: Medicare Other

## 2019-02-22 ENCOUNTER — Encounter (HOSPITAL_COMMUNITY): Payer: Self-pay | Admitting: Hematology

## 2019-02-22 ENCOUNTER — Inpatient Hospital Stay (HOSPITAL_COMMUNITY): Payer: Medicare Other | Attending: Hematology

## 2019-02-22 VITALS — BP 140/73 | HR 55 | Temp 96.8°F | Resp 18

## 2019-02-22 DIAGNOSIS — R197 Diarrhea, unspecified: Secondary | ICD-10-CM | POA: Insufficient documentation

## 2019-02-22 DIAGNOSIS — Z87891 Personal history of nicotine dependence: Secondary | ICD-10-CM | POA: Diagnosis not present

## 2019-02-22 DIAGNOSIS — Z95828 Presence of other vascular implants and grafts: Secondary | ICD-10-CM

## 2019-02-22 DIAGNOSIS — G629 Polyneuropathy, unspecified: Secondary | ICD-10-CM | POA: Insufficient documentation

## 2019-02-22 DIAGNOSIS — C184 Malignant neoplasm of transverse colon: Secondary | ICD-10-CM

## 2019-02-22 DIAGNOSIS — Z79899 Other long term (current) drug therapy: Secondary | ICD-10-CM | POA: Diagnosis not present

## 2019-02-22 DIAGNOSIS — R634 Abnormal weight loss: Secondary | ICD-10-CM | POA: Insufficient documentation

## 2019-02-22 DIAGNOSIS — E1142 Type 2 diabetes mellitus with diabetic polyneuropathy: Secondary | ICD-10-CM | POA: Diagnosis not present

## 2019-02-22 DIAGNOSIS — Z5111 Encounter for antineoplastic chemotherapy: Secondary | ICD-10-CM | POA: Diagnosis not present

## 2019-02-22 LAB — CBC WITH DIFFERENTIAL/PLATELET
Abs Immature Granulocytes: 0.06 10*3/uL (ref 0.00–0.07)
Basophils Absolute: 0.1 10*3/uL (ref 0.0–0.1)
Basophils Relative: 1 %
Eosinophils Absolute: 0.3 10*3/uL (ref 0.0–0.5)
Eosinophils Relative: 4 %
HCT: 39.9 % (ref 39.0–52.0)
Hemoglobin: 12.5 g/dL — ABNORMAL LOW (ref 13.0–17.0)
Immature Granulocytes: 1 %
Lymphocytes Relative: 32 %
Lymphs Abs: 3.1 10*3/uL (ref 0.7–4.0)
MCH: 34.1 pg — ABNORMAL HIGH (ref 26.0–34.0)
MCHC: 31.3 g/dL (ref 30.0–36.0)
MCV: 108.7 fL — ABNORMAL HIGH (ref 80.0–100.0)
Monocytes Absolute: 1.3 10*3/uL — ABNORMAL HIGH (ref 0.1–1.0)
Monocytes Relative: 13 %
Neutro Abs: 4.9 10*3/uL (ref 1.7–7.7)
Neutrophils Relative %: 49 %
Platelets: 287 10*3/uL (ref 150–400)
RBC: 3.67 MIL/uL — ABNORMAL LOW (ref 4.22–5.81)
RDW: 14.2 % (ref 11.5–15.5)
WBC: 9.7 10*3/uL (ref 4.0–10.5)
nRBC: 0 % (ref 0.0–0.2)

## 2019-02-22 LAB — MAGNESIUM: Magnesium: 2.1 mg/dL (ref 1.7–2.4)

## 2019-02-22 LAB — COMPREHENSIVE METABOLIC PANEL
ALT: 28 U/L (ref 0–44)
AST: 29 U/L (ref 15–41)
Albumin: 3.5 g/dL (ref 3.5–5.0)
Alkaline Phosphatase: 46 U/L (ref 38–126)
Anion gap: 8 (ref 5–15)
BUN: 17 mg/dL (ref 8–23)
CO2: 27 mmol/L (ref 22–32)
Calcium: 9.2 mg/dL (ref 8.9–10.3)
Chloride: 104 mmol/L (ref 98–111)
Creatinine, Ser: 0.78 mg/dL (ref 0.61–1.24)
GFR calc Af Amer: >60 mL/min (ref >60)
GFR calc non Af Amer: >60 mL/min (ref >60)
Glucose, Bld: 119 mg/dL — ABNORMAL HIGH (ref 70–99)
Potassium: 4.2 mmol/L (ref 3.5–5.1)
Sodium: 139 mmol/L (ref 135–145)
Total Bilirubin: 0.5 mg/dL (ref 0.3–1.2)
Total Protein: 6.8 g/dL (ref 6.5–8.1)

## 2019-02-22 MED ORDER — SODIUM CHLORIDE 0.9 % IV SOLN
Freq: Once | INTRAVENOUS | Status: AC
  Administered 2019-02-22: 12:00:00 via INTRAVENOUS
  Filled 2019-02-22: qty 5

## 2019-02-22 MED ORDER — SODIUM CHLORIDE 0.9 % IV SOLN
1920.0000 mg/m2 | INTRAVENOUS | Status: DC
  Administered 2019-02-22: 3950 mg via INTRAVENOUS
  Filled 2019-02-22: qty 79

## 2019-02-22 MED ORDER — SODIUM CHLORIDE 0.9% FLUSH
10.0000 mL | INTRAVENOUS | Status: DC | PRN
  Administered 2019-02-22: 10 mL
  Filled 2019-02-22: qty 10

## 2019-02-22 MED ORDER — SODIUM CHLORIDE 0.9 % IV SOLN
5.0000 mg/kg | Freq: Once | INTRAVENOUS | Status: AC
  Administered 2019-02-22: 400 mg via INTRAVENOUS
  Filled 2019-02-22: qty 16

## 2019-02-22 MED ORDER — SODIUM CHLORIDE 0.9 % IV SOLN
Freq: Once | INTRAVENOUS | Status: AC
  Administered 2019-02-22: 10:00:00 via INTRAVENOUS

## 2019-02-22 MED ORDER — SODIUM CHLORIDE 0.9 % IV SOLN
700.0000 mg | Freq: Once | INTRAVENOUS | Status: AC
  Administered 2019-02-22: 700 mg via INTRAVENOUS
  Filled 2019-02-22: qty 35

## 2019-02-22 MED ORDER — SODIUM CHLORIDE 0.9 % IV SOLN
10.0000 mg | Freq: Once | INTRAVENOUS | Status: AC
  Administered 2019-02-22: 10 mg via INTRAVENOUS
  Filled 2019-02-22: qty 10

## 2019-02-22 MED ORDER — FLUOROURACIL CHEMO INJECTION 2.5 GM/50ML
320.0000 mg/m2 | Freq: Once | INTRAVENOUS | Status: AC
  Administered 2019-02-22: 650 mg via INTRAVENOUS
  Filled 2019-02-22: qty 13

## 2019-02-22 MED ORDER — PALONOSETRON HCL INJECTION 0.25 MG/5ML
0.2500 mg | Freq: Once | INTRAVENOUS | Status: AC
  Administered 2019-02-22: 0.25 mg via INTRAVENOUS
  Filled 2019-02-22: qty 5

## 2019-02-22 NOTE — Assessment & Plan Note (Addendum)
1.  Stage IV colon cancer (pT3 pN1 M1) MSI high: - K-ras mutation negative, BRAF V600 E+, PIK3CA, TP53, CDKN2A -12 cycles of FOLFOX from 08/26/2016 through 01/27/2017 -Opdivo from 02/23/2017 through 07/13/2017 -FOLFIRI and bevacizumab from 07/27/2017 through 10/13/2018 - CT CAP from 11/08/2018 showed stable disease and abdomen and pelvis.  Mild progression of linear reticulonodular opacities in the lungs likely atypical infectious process. -5-FU and bevacizumab maintenance started on 10/27/2018. -CEA stable around 9.6. -CT CAP on 02/01/2019 showed 12 mm short axis subcarinal lymph node improved.  No suspicious findings for metastatic disease in the abdomen or pelvis.  Stable nodularity in the left upper abdomen and right lower quadrant. -I had a prolonged discussion about the rationale behind continuing maintenance therapy today as he had many questions about treatment.  I have also talked to him about terminal nature of his cancer.  We have signed a DNR and a hard copy to the chart.  He will also provide copy of his advance directives.  He also asked me about his plan to travel and stop his treatments briefly.  I have told him that it is possible and to inform us in advance. -We have reviewed his blood work.  He will proceed with his next maintenance treatment today.  2.  Peripheral neuropathy: -He is not on gabapentin anymore.  His numbness has improved.  3.  Diarrhea: -he has intermittent diarrhea after chemotherapy which is controlled with Lomotil.  4.  Diabetes: -he is reportedly taking insulin as needed.  He is not on any oral antidiabetic pills. -He has been reporting elevated blood sugars that make him not feel good.  I have recommended him to record his sugars fasting and postprandial and bring it back to me in 2 weeks.  He will also bring the name of the medication he is on. -This is managed by Dr. Shon Millet on until recently.

## 2019-02-22 NOTE — Progress Notes (Signed)
Labs reviewed with MD today at office visit. Proceed as planned per MD  Late entry 02-22-2019 Treatment given per orders. Patient tolerated it well without problems. Vitals stable and discharged home from clinic ambulatory. Follow up as scheduled.

## 2019-02-22 NOTE — Progress Notes (Signed)
[place], [place] [address]   CLINIC:  Medical Oncology/Hematology  PCP:  Tobe Sos, MD [address] [place] [address] [phone]   REASON FOR VISIT:  Follow-up for Colon Cancer  CURRENT THERAPY: Maintenance 5-FU/bevacizumab  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[place])  07/11/2016 Initial Diagnosis   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[place])   07/27/2017 -  Chemotherapy   The patient had palonosetron (ALOXI) injection 0.25 mg, 0.25 mg, Intravenous,  Once, 41 of 43 cycles Administration: 0.25 mg (07/27/2017), 0.25 mg (08/10/2017), 0.25 mg (08/24/2017), 0.25 mg (09/07/2017), 0.25 mg (09/21/2017), 0.25 mg (10/05/2017), 0.25 mg (10/19/2017), 0.25 mg (11/02/2017), 0.25 mg (11/16/2017), 0.25 mg (11/30/2017), 0.25 mg (12/14/2017), 0.25 mg (12/28/2017), 0.25 mg (01/12/2018), 0.25 mg (01/26/2018), 0.25 mg (02/09/2018), 0.25 mg (02/24/2018), 0.25 mg (03/10/2018), 0.25 mg (03/29/2018), 0.25 mg (04/12/2018), 0.25 mg (04/26/2018), 0.25 mg (05/10/2018), 0.25 mg (05/24/2018), 0.25 mg (06/07/2018), 0.25 mg (06/21/2018), 0.25 mg (07/05/2018), 0.25 mg (07/19/2018), 0.25 mg (08/03/2018), 0.25 mg (08/18/2018), 0.25 mg (09/01/2018), 0.25 mg (09/15/2018), 0.25 mg (09/29/2018), 0.25 mg (10/13/2018), 0.25 mg (10/27/2018), 0.25 mg (11/10/2018), 0.25 mg (11/24/2018), 0.25 mg (12/08/2018), 0.25 mg (12/22/2018), 0.25 mg (01/05/2019), 0.25 mg (01/19/2019), 0.25 mg (02/02/2019) pegfilgrastim-cbqv (UDENYCA) injection 6 mg, 6 mg, Subcutaneous, Once, 7 of 7 cycles Administration: 6 mg (10/07/2017), 6 mg (10/21/2017), 6 mg (11/04/2017), 6 mg (11/18/2017), 6 mg (12/02/2017), 6 mg (12/16/2017), 6 mg (12/30/2017) bevacizumab-bvzr (ZIRABEV) chemo injection 400 mg, 5 mg/kg = 400 mg (100 % of original dose 5 mg/kg), Intravenous,  Once, 2 of 2 cycles Dose modification: 5 mg/kg (original dose 5 mg/kg, Cycle 35) bevacizumab (AVASTIN) 450 mg in sodium chloride 0.9 % 100 mL chemo  infusion, 5 mg/kg = 450 mg, Intravenous,  Once, 35 of 35 cycles Dose modification: 4 mg/kg (80 % of original dose 5 mg/kg, Cycle 5, Reason: Provider Judgment), 5 mg/kg (original dose 5 mg/kg, Cycle 16, Reason: Other (see comments)), 5 mg/kg (original dose 5 mg/kg, Cycle 35) Administration: 450 mg (07/27/2017), 450 mg (08/10/2017), 450 mg (08/24/2017), 450 mg (09/07/2017), 450 mg (09/21/2017), 450 mg (10/05/2017), 450 mg (10/19/2017), 450 mg (11/02/2017), 450 mg (11/16/2017), 450 mg (11/30/2017), 450 mg (12/14/2017), 450 mg (12/28/2017), 400 mg (01/12/2018), 400 mg (01/26/2018), 400 mg (02/09/2018), 400 mg (02/24/2018), 400 mg (03/10/2018), 400 mg (03/29/2018), 400 mg (04/12/2018), 400 mg (04/26/2018), 400 mg (05/10/2018), 400 mg (05/24/2018), 400 mg (06/07/2018), 400 mg (06/21/2018), 400 mg (07/05/2018), 400 mg (07/19/2018), 400 mg (08/03/2018), 400 mg (08/18/2018), 400 mg (09/01/2018), 400 mg (09/15/2018), 400 mg (09/29/2018), 400 mg (10/13/2018), 400 mg (10/27/2018), 400 mg (11/10/2018), 400 mg (11/24/2018) irinotecan (CAMPTOSAR) 400 mg in dextrose 5 % 500 mL chemo infusion, 180 mg/m2 = 400 mg, Intravenous,  Once, 32 of 32 cycles Dose modification: 144 mg/m2 (80 % of original dose 180 mg/m2, Cycle 5, Reason: Provider Judgment), 135 mg/m2 (75 % of original dose 180 mg/m2, Cycle 30, Reason: Other (see comments), Comment: diarrhea) Administration: 400 mg (07/27/2017), 400 mg (08/10/2017), 400 mg (08/24/2017), 400 mg (09/07/2017), 320 mg (09/21/2017), 320 mg (10/05/2017), 320 mg (10/19/2017), 320 mg (11/02/2017), 320 mg (11/16/2017), 320 mg (11/30/2017), 320 mg (12/14/2017), 320 mg (12/28/2017), 320 mg (01/12/2018), 320 mg (01/26/2018), 320 mg (02/09/2018), 300 mg (02/24/2018), 300 mg (03/10/2018), 300 mg (03/29/2018), 300 mg (04/12/2018), 300 mg (04/26/2018), 300 mg (05/10/2018), 300 mg (05/24/2018), 300 mg (06/07/2018), 300 mg (06/21/2018), 300 mg (07/05/2018), 300 mg (07/19/2018), 300 mg (08/03/2018), 300 mg (08/18/2018), 300  mg (09/01/2018), 280 mg (09/15/2018), 280 mg (09/29/2018),  280 mg (10/13/2018) leucovorin 800 mg in dextrose 5 % 250 mL infusion, 872 mg, Intravenous,  Once, 41 of 43 cycles Dose modification: 320 mg/m2 (80 % of original dose 400 mg/m2, Cycle 5, Reason: Provider Judgment) Administration: 800 mg (07/27/2017), 800 mg (08/10/2017), 800 mg (08/24/2017), 800 mg (09/07/2017), 700 mg (09/21/2017), 700 mg (10/05/2017), 700 mg (10/19/2017), 700 mg (11/02/2017), 700 mg (11/16/2017), 700 mg (11/30/2017), 700 mg (12/14/2017), 700 mg (12/28/2017), 700 mg (01/12/2018), 700 mg (01/26/2018), 700 mg (02/09/2018), 700 mg (02/24/2018), 700 mg (03/10/2018), 700 mg (03/29/2018), 700 mg (04/12/2018), 700 mg (04/26/2018), 700 mg (05/10/2018), 700 mg (05/24/2018), 700 mg (06/07/2018), 700 mg (06/21/2018), 700 mg (07/05/2018), 700 mg (07/19/2018), 700 mg (08/03/2018), 700 mg (08/18/2018), 700 mg (09/01/2018), 700 mg (09/15/2018), 700 mg (09/29/2018), 700 mg (10/13/2018), 700 mg (10/27/2018), 700 mg (11/10/2018), 700 mg (11/24/2018), 700 mg (12/08/2018), 700 mg (12/22/2018), 700 mg (01/05/2019), 700 mg (01/19/2019), 700 mg (02/02/2019) fluorouracil (ADRUCIL) chemo injection 850 mg, 400 mg/m2 = 850 mg, Intravenous,  Once, 41 of 43 cycles Dose modification: 320 mg/m2 (80 % of original dose 400 mg/m2, Cycle 5, Reason: Provider Judgment) Administration: 850 mg (07/27/2017), 850 mg (08/10/2017), 850 mg (08/24/2017), 850 mg (09/07/2017), 700 mg (09/21/2017), 700 mg (10/05/2017), 700 mg (10/19/2017), 700 mg (11/02/2017), 700 mg (11/16/2017), 700 mg (11/30/2017), 700 mg (12/14/2017), 700 mg (12/28/2017), 700 mg (01/12/2018), 700 mg (01/26/2018), 700 mg (02/09/2018), 650 mg (02/24/2018), 650 mg (03/10/2018), 650 mg (03/29/2018), 650 mg (04/12/2018), 650 mg (04/26/2018), 650 mg (05/10/2018), 650 mg (05/24/2018), 650 mg (06/07/2018), 650 mg (06/21/2018), 650 mg (07/05/2018), 650 mg (07/19/2018), 650 mg (08/03/2018), 650 mg (08/18/2018), 650 mg (09/01/2018), 650 mg (09/15/2018), 650 mg (09/29/2018), 650 mg (10/13/2018), 650 mg (10/27/2018), 650 mg (11/10/2018), 650 mg (11/24/2018), 650 mg  (12/08/2018), 650 mg (12/22/2018), 650 mg (01/05/2019), 650 mg (01/19/2019), 650 mg (02/02/2019) fosaprepitant (EMEND) 150 mg in sodium chloride 0.9 % 145 mL IVPB, , Intravenous,  Once, 7 of 9 cycles Administration:  (11/24/2018),  (12/08/2018),  (12/22/2018),  (01/05/2019),  (01/19/2019),  (02/02/2019) fluorouracil (ADRUCIL) 5,250 mg in sodium chloride 0.9 % 145 mL chemo infusion, 2,400 mg/m2 = 5,250 mg, Intravenous, 1 Day/Dose, 41 of 43 cycles Dose modification: 1,920 mg/m2 (80 % of original dose 2,400 mg/m2, Cycle 5, Reason: Provider Judgment) Administration: 5,250 mg (07/27/2017), 5,250 mg (08/10/2017), 5,250 mg (08/24/2017), 5,250 mg (09/07/2017), 4,200 mg (09/21/2017), 4,200 mg (10/05/2017), 4,200 mg (10/19/2017), 4,200 mg (11/02/2017), 4,200 mg (11/16/2017), 4,200 mg (11/30/2017), 4,200 mg (12/14/2017), 4,200 mg (12/28/2017), 4,200 mg (01/12/2018), 4,200 mg (01/26/2018), 4,200 mg (02/09/2018), 3,950 mg (02/24/2018), 3,950 mg (03/10/2018), 3,950 mg (03/29/2018), 3,950 mg (04/12/2018), 3,950 mg (04/26/2018), 3,950 mg (05/10/2018), 3,950 mg (05/24/2018), 3,950 mg (06/07/2018), 3,950 mg (06/21/2018), 3,950 mg (07/05/2018), 3,950 mg (07/19/2018), 3,950 mg (08/03/2018), 3,950 mg (08/18/2018), 3,950 mg (09/01/2018), 3,950 mg (09/15/2018), 3,950 mg (09/29/2018), 3,950 mg (10/13/2018), 3,950 mg (10/27/2018), 3,950 mg (11/10/2018), 3,950 mg (11/24/2018), 3,950 mg (12/08/2018), 3,950 mg (12/22/2018), 3,950 mg (01/05/2019), 3,950 mg (01/19/2019), 3,950 mg (02/02/2019) bevacizumab-bvzr (ZIRABEV) 400 mg in sodium chloride 0.9 % 100 mL chemo infusion, 5 mg/kg = 400 mg (100 % of original dose 5 mg/kg), Intravenous,  Once, 6 of 8 cycles Dose modification: 5 mg/kg (original dose 5 mg/kg, Cycle 36) Administration: 400 mg (12/08/2018), 400 mg (12/22/2018), 400 mg (01/05/2019), 400 mg (01/19/2019), 400 mg (02/02/2019)  for chemotherapy treatment.       CANCER STAGING: Cancer Staging Malignant neoplasm of transverse colon [facility]) Staging form: Colon  and Rectum, AJCC  8th Edition - Clinical: cT3, cN1a, cM1 - Signed by Twana First, MD on 03/09/2017 - Pathologic: No stage assigned - Unsigned    INTERVAL HISTORY:  Mr. Kevin Kirk 85 y.o. male seen for follow-up of stage IV cancer.  He had intermittent diarrhea.  Numbness in the feet is stable and better.  He reports increase in his blood sugars which make him feel lousy.  He also had on and off episodes of vomiting.  He is accompanied by his caretaker and had a lot of questions today.  Denies any fevers or chills.  REVIEW OF SYSTEMS:  Review of Systems  Gastrointestinal: Positive for diarrhea and vomiting.  Neurological: Positive for numbness.  All other systems reviewed and are negative.    PAST MEDICAL/SURGICAL HISTORY:  Past Medical History:  Diagnosis Date  . Chronic pulmonary embolism ([place]) 28/4132   compliction of the valve replacement surgery  . COPD (chronic obstructive pulmonary disease) ([place])   . Diabetes ([place]) 04/22/2016  . H/O aortic valve replacement 10/2008  . Hypercholesteremia   . Hypertension   . Pulmonary emboli ([place]) 10/2008  . Sleep apnea    Past Surgical History:  Procedure Laterality Date  . AORTIC VALVE REPLACEMENT     2010  . CARDIAC CATHETERIZATION  2010  . COLONOSCOPY N/A 06/05/2016   Procedure: COLONOSCOPY;  Surgeon: Rogene Houston, MD;  Location: [facility] ENDO SUITE;  Service: Endoscopy;  Laterality: N/A;  . PARTIAL COLECTOMY N/A 07/11/2016   Procedure: PARTIAL COLECTOMY;  Surgeon: Aviva Signs, MD;  Location: [facility] ORS;  Service: General;  Laterality: N/A;  . PORTACATH PLACEMENT N/A 08/13/2016   Procedure: INSERTION PORT-A-CATH LEFT SUBCLAVIAN;  Surgeon: Aviva Signs, MD;  Location: [facility] ORS;  Service: General;  Laterality: N/A;  . REPLACEMENT TOTAL KNEE     1996  . spenectomy     from trauma     SOCIAL HISTORY:  Social History   Socioeconomic History  . Marital status: Widowed    Spouse name: Not on file  . Number of children: Not on file  . Years of education: Not  on file  . Highest education level: Not on file  Occupational History  . Not on file  Social Needs  . Financial resource strain: Not on file  . Food insecurity    Worry: Not on file    Inability: Not on file  . Transportation needs    Medical: Not on file    Non-medical: Not on file  Tobacco Use  . Smoking status: Former Smoker    Years: 15.00    Types: Cigarettes    Quit date: 1964    Years since quitting: 56.9  . Smokeless tobacco: Never Used  Substance and Sexual Activity  . Alcohol use: No  . Drug use: No  . Sexual activity: Yes  Lifestyle  . Physical activity    Days per week: Not on file    Minutes per session: Not on file  . Stress: Not on file  Relationships  . Social Herbalist on phone: Not on file    Gets together: Not on file    Attends religious service: Not on file    Active member of club or organization: Not on file    Attends meetings of clubs or organizations: Not on file    Relationship status: Not on file  . Intimate partner violence    Fear of current or ex partner: Not on file    Emotionally  abused: Not on file    Physically abused: Not on file    Forced sexual activity: Not on file  Other Topics Concern  . Not on file  Social History Narrative  . Not on file    FAMILY HISTORY:  Family History  Problem Relation Age of Onset  . Colon cancer Neg Hx     CURRENT MEDICATIONS:  Outpatient Encounter Medications as of 02/22/2019  Medication Sig Note  . atorvastatin (LIPITOR) 10 MG tablet Take 10 mg by mouth at bedtime.    . Cholecalciferol (VITAMIN D3) 5000 units CAPS Take 5,000 Units by mouth daily.    Marland Kitchen lidocaine-prilocaine (EMLA) cream Apply 1 application topically as needed.   Marland Kitchen lisinopril (PRINIVIL,ZESTRIL) 10 MG tablet Take 10 mg by mouth daily.   . metoprolol (LOPRESSOR) 50 MG tablet Take 50 mg by mouth 2 (two) times daily.   . Multiple Vitamins-Minerals (CENTRUM SILVER 50+MEN PO) Take 1 tablet by mouth daily.   Marland Kitchen NOVOFINE  32G X 6 MM MISC    . RELION GLUCOSE TEST STRIPS test strip USE 1 STRIP TO CHECK GLUCOSE ONCE DAILY   . vitamin B-12 (CYANOCOBALAMIN) 1000 MCG tablet Take 1,000 mcg by mouth daily.    . diphenoxylate-atropine (LOMOTIL) 2.5-0.025 MG tablet Take 1 tablet by mouth 4 (four) times daily as needed for diarrhea or loose stools. (Patient not taking: Reported on 02/22/2019)   . ondansetron (ZOFRAN ODT) 8 MG disintegrating tablet Take 1 tablet (8 mg total) by mouth every 8 (eight) hours as needed for nausea or vomiting. (Patient not taking: Reported on 02/22/2019)   . [DISCONTINUED] prochlorperazine (COMPAZINE) 10 MG tablet Take 1 tablet (10 mg total) by mouth every 6 (six) hours as needed (Nausea or vomiting). 08/05/2016: New Med   Facility-Administered Encounter Medications as of 02/22/2019  Medication  . sodium chloride flush (NS) 0.9 % injection 10 mL    ALLERGIES:  Allergies  Allergen Reactions  . Amoxicillin Hives    Has patient had a PCN reaction causing immediate rash, facial/tongue/throat swelling, SOB or lightheadedness with hypotension: No Has patient had a PCN reaction causing severe rash involving mucus membranes or skin necrosis: Yes Has patient had a PCN reaction that required hospitalization No Has patient had a PCN reaction occurring within the last 10 years: No If all of the above answers are "NO", then may proceed with Cephalosporin use.   . Tamsulosin Hcl     Cant sleep, confusion      PHYSICAL EXAM:  ECOG Performance status: 1  Vitals:   02/22/19 0823  BP: (!) 152/72  Pulse: 61  Resp: 18  Temp: 97.8 F (36.6 C)  SpO2: 98%   Filed Weights   02/22/19 0823  Weight: 160 lb 6.4 oz (72.8 kg)    Physical Exam Vitals signs reviewed.  Constitutional:      Appearance: Normal appearance.  HENT:     Head: Normocephalic.     Nose: Nose normal.     Mouth/Throat:     Mouth: Mucous membranes are moist.     Pharynx: Oropharynx is clear.  Eyes:     Extraocular Movements:  Extraocular movements intact.     Conjunctiva/sclera: Conjunctivae normal.  Neck:     Musculoskeletal: Normal range of motion.  Cardiovascular:     Rate and Rhythm: Normal rate and regular rhythm.     Pulses: Normal pulses.     Heart sounds: Normal heart sounds.  Pulmonary:     Effort: Pulmonary effort is normal.  Breath sounds: Normal breath sounds.  Abdominal:     General: Bowel sounds are normal.  Musculoskeletal: Normal range of motion.  Skin:    General: Skin is warm and dry.  Neurological:     General: No focal deficit present.     Mental Status: He is alert and oriented to person, place, and time.  Psychiatric:        Mood and Affect: Mood normal.        Behavior: Behavior normal.        Thought Content: Thought content normal.        Judgment: Judgment normal.      LABORATORY DATA:  I have reviewed the labs as listed.  CBC    Component Value Date/Time   WBC 9.7 02/22/2019 0806   RBC 3.67 (L) 02/22/2019 0806   HGB 12.5 (L) 02/22/2019 0806   HCT 39.9 02/22/2019 0806   PLT 287 02/22/2019 0806   MCV 108.7 (H) 02/22/2019 0806   MCH 34.1 (H) 02/22/2019 0806   MCHC 31.3 02/22/2019 0806   RDW 14.2 02/22/2019 0806   LYMPHSABS 3.1 02/22/2019 0806   MONOABS 1.3 (H) 02/22/2019 0806   EOSABS 0.3 02/22/2019 0806   BASOSABS 0.1 02/22/2019 0806   CMP Latest Ref Rng & Units 02/22/2019 02/02/2019 01/19/2019  Glucose 70 - 99 mg/dL 119(H) 119(H) 168(H)  BUN 8 - 23 mg/dL _0 Creatinine 0.61 - 1.24 mg/dL 0.78 0.83 0.91  Sodium 135 - 145 mmol/L 139 137 137  Potassium 3.5 - 5.1 mmol/L 4.2 4.2 4.2  Chloride 98 - 111 mmol/L 104 101 100  CO2 22 - 32 mmol/L _1 Calcium 8.9 - 10.3 mg/dL 9.2 8.9 8.8(L)  Total Protein 6.5 - 8.1 g/dL 6.8 6.3(L) 6.4(L)  Total Bilirubin 0.3 - 1.2 mg/dL 0.5 0.8 0.8  Alkaline Phos 38 - 126 U/L 46 46 50  AST 15 - 41 U/L _2 ALT 0 - 44 U/L _3 I have independently reviewed his scans and discussed with the patient.    ASSESSMENT & PLAN:   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[place]) 1.  Stage IV colon cancer (pT3 pN1 M1) MSI high: - K-ras mutation negative, BRAF V600 E+, PIK3CA, TP53, CDKN2A -12 cycles of FOLFOX from 08/26/2016 through 01/27/2017 -Opdivo from 02/23/2017 through 07/13/2017 -FOLFIRI and bevacizumab from 07/27/2017 through 10/13/2018 - CT CAP from 11/08/2018 showed stable disease and abdomen and pelvis.  Mild progression of linear reticulonodular opacities in the lungs likely atypical infectious process. -5-FU and bevacizumab maintenance started on 10/27/2018. -CEA stable around 9.6. -CT CAP on 02/01/2019 showed 12 mm short axis subcarinal lymph node improved.  No suspicious findings for metastatic disease in the abdomen or pelvis.  Stable nodularity in the left upper abdomen and right lower quadrant. -I had a prolonged discussion about the rationale behind continuing maintenance therapy today as he had many questions about treatment.  I have also talked to him about terminal nature of his cancer.  We have signed a DNR and a hard copy to the chart.  He will also provide copy of his advance directives.  He also asked me about his plan to travel and stop his treatments briefly.  I have told him that it is possible and to inform us in advance. -We have reviewed his blood work.  He will proceed with his next maintenance treatment today.  2.  Peripheral neuropathy: -He is not on gabapentin anymore.  His numbness has improved.  3.  Diarrhea: -he has intermittent diarrhea after chemotherapy which is controlled with Lomotil.  4.  Diabetes: -he is reportedly taking insulin as needed.  He is not on any oral antidiabetic pills. -He has been reporting elevated blood sugars that make him not feel good.  I have recommended him to record his sugars fasting and postprandial and bring it back to me in 2 weeks.  He will also bring the name of the medication he is on. -This is managed by Dr. Shon Millet on until recently.    Total time spent is 25 minutes with more than 50% of the time spent discussing treatment plan, counseling and coordination of care.  Orders placed this encounter:  No orders of the defined types were placed in this encounter.     [place] [phone]

## 2019-02-22 NOTE — Patient Instructions (Addendum)
[place] at [facility] Discharge Instructions  You were seen today by Dr. Delton Coombes. He went over your recent lab results. Start taking your nausea medication every evening at 5pm. Please monitor his sugars and how much insulin that is given. He will see you back in 2 weeks for labs, treatment and follow up.   Thank you for choosing [place] at [facility] to provide your oncology and hematology care.  To afford each patient quality time with our provider, please arrive at least 15 minutes before your scheduled appointment time.   If you have a lab appointment with the [facility] please come in thru the  Main Entrance and check in at the main information desk  You need to re-schedule your appointment should you arrive 10 or more minutes late.  We strive to give you quality time with our providers, and arriving late affects you and other patients whose appointments are after yours.  Also, if you no show three or more times for appointments you may be dismissed from the clinic at the providers discretion.     Again, thank you for choosing [facility].  Our hope is that these requests will decrease the amount of time that you wait before being seen by our physicians.       _____________________________________________________________  Should you have questions after your visit to [facility], please contact our office at [phone] between the hours of 8:00 a.m. and 4:30 p.m.  Voicemails left after 4:00 p.m. will not be returned until the following business day.  For prescription refill requests, have your pharmacy contact our office and allow 72 hours.    Cancer Center Support Programs:   > Cancer Support Group  2nd Tuesday of the month 1pm-2pm, Journey Room

## 2019-02-23 LAB — CEA: CEA: 9.8 ng/mL — ABNORMAL HIGH (ref 0.0–4.7)

## 2019-02-24 ENCOUNTER — Other Ambulatory Visit: Payer: Self-pay

## 2019-02-24 ENCOUNTER — Inpatient Hospital Stay (HOSPITAL_COMMUNITY): Payer: Medicare Other

## 2019-02-24 VITALS — BP 123/63 | HR 50 | Temp 97.0°F | Resp 18

## 2019-02-24 DIAGNOSIS — Z5111 Encounter for antineoplastic chemotherapy: Secondary | ICD-10-CM | POA: Diagnosis not present

## 2019-02-24 DIAGNOSIS — C184 Malignant neoplasm of transverse colon: Secondary | ICD-10-CM

## 2019-02-24 DIAGNOSIS — Z95828 Presence of other vascular implants and grafts: Secondary | ICD-10-CM

## 2019-02-24 MED ORDER — SODIUM CHLORIDE 0.9% FLUSH
10.0000 mL | INTRAVENOUS | Status: DC | PRN
  Administered 2019-02-24: 10 mL
  Filled 2019-02-24: qty 10

## 2019-02-24 MED ORDER — HEPARIN SOD (PORK) LOCK FLUSH 100 UNIT/ML IV SOLN
500.0000 [IU] | Freq: Once | INTRAVENOUS | Status: AC | PRN
  Administered 2019-02-24: 500 [IU]

## 2019-02-24 NOTE — Patient Instructions (Signed)
[facility] Discharge Instructions for Patients Receiving Chemotherapy  Today you received the following chemotherapy agents   To help prevent nausea and vomiting after your treatment, we encourage you to take your nausea medication   If you develop nausea and vomiting that is not controlled by your nausea medication, call the clinic.   BELOW ARE SYMPTOMS THAT SHOULD BE REPORTED IMMEDIATELY:  *FEVER GREATER THAN 100.5 F  *CHILLS WITH OR WITHOUT FEVER  NAUSEA AND VOMITING THAT IS NOT CONTROLLED WITH YOUR NAUSEA MEDICATION  *UNUSUAL SHORTNESS OF BREATH  *UNUSUAL BRUISING OR BLEEDING  TENDERNESS IN MOUTH AND THROAT WITH OR WITHOUT PRESENCE OF ULCERS  *URINARY PROBLEMS  *BOWEL PROBLEMS  UNUSUAL RASH Items with * indicate a potential emergency and should be followed up as soon as possible.  Feel free to call the clinic should you have any questions or concerns. The clinic phone number is [phone].  Please show the CHEMO ALERT CARD at check-in to the Emergency Department and triage nurse.   

## 2019-02-24 NOTE — Progress Notes (Signed)
Patient presents today for pump d/c and port a cath flush. Vital signs stable.Portacath located in the left chest wall.  Clean, Dry and Intact Good blood return present. Portacath flushed with 13ml NS and 500U/78ml Heparin per protocol and needle removed intact. Procedure without incident. Patient tolerated procedure well.  No complaints at this time. Discharged from clinic ambulatory. F/U with [facility] as scheduled.

## 2019-02-25 ENCOUNTER — Other Ambulatory Visit (HOSPITAL_COMMUNITY): Payer: Self-pay | Admitting: *Deleted

## 2019-02-25 MED ORDER — ONDANSETRON 8 MG PO TBDP: 8.0000 mg | ORAL_TABLET | Freq: Three times a day (TID) | ORAL | 0 refills | Status: DC | PRN

## 2019-03-09 ENCOUNTER — Other Ambulatory Visit: Payer: Self-pay

## 2019-03-09 ENCOUNTER — Inpatient Hospital Stay (HOSPITAL_COMMUNITY): Payer: Medicare Other

## 2019-03-09 ENCOUNTER — Inpatient Hospital Stay (HOSPITAL_BASED_OUTPATIENT_CLINIC_OR_DEPARTMENT_OTHER): Payer: Medicare Other | Admitting: Hematology

## 2019-03-09 DIAGNOSIS — C184 Malignant neoplasm of transverse colon: Secondary | ICD-10-CM

## 2019-03-09 DIAGNOSIS — Z5111 Encounter for antineoplastic chemotherapy: Secondary | ICD-10-CM | POA: Diagnosis not present

## 2019-03-09 LAB — COMPREHENSIVE METABOLIC PANEL
ALT: 25 U/L (ref 0–44)
AST: 27 U/L (ref 15–41)
Albumin: 3.6 g/dL (ref 3.5–5.0)
Alkaline Phosphatase: 42 U/L (ref 38–126)
Anion gap: 11 (ref 5–15)
BUN: 19 mg/dL (ref 8–23)
CO2: 26 mmol/L (ref 22–32)
Calcium: 9.4 mg/dL (ref 8.9–10.3)
Chloride: 100 mmol/L (ref 98–111)
Creatinine, Ser: 0.88 mg/dL (ref 0.61–1.24)
GFR calc Af Amer: >60 mL/min (ref >60)
GFR calc non Af Amer: >60 mL/min (ref >60)
Glucose, Bld: 214 mg/dL — ABNORMAL HIGH (ref 70–99)
Potassium: 4.5 mmol/L (ref 3.5–5.1)
Sodium: 137 mmol/L (ref 135–145)
Total Bilirubin: 0.9 mg/dL (ref 0.3–1.2)
Total Protein: 6.7 g/dL (ref 6.5–8.1)

## 2019-03-09 LAB — CBC WITH DIFFERENTIAL/PLATELET
Abs Immature Granulocytes: 0.02 10*3/uL (ref 0.00–0.07)
Basophils Absolute: 0 10*3/uL (ref 0.0–0.1)
Basophils Relative: 0 %
Eosinophils Absolute: 0.4 10*3/uL (ref 0.0–0.5)
Eosinophils Relative: 5 %
HCT: 39.5 % (ref 39.0–52.0)
Hemoglobin: 12.7 g/dL — ABNORMAL LOW (ref 13.0–17.0)
Immature Granulocytes: 0 %
Lymphocytes Relative: 30 %
Lymphs Abs: 2.7 10*3/uL (ref 0.7–4.0)
MCH: 35 pg — ABNORMAL HIGH (ref 26.0–34.0)
MCHC: 32.2 g/dL (ref 30.0–36.0)
MCV: 108.8 fL — ABNORMAL HIGH (ref 80.0–100.0)
Monocytes Absolute: 1 10*3/uL (ref 0.1–1.0)
Monocytes Relative: 11 %
Neutro Abs: 4.8 10*3/uL (ref 1.7–7.7)
Neutrophils Relative %: 54 %
Platelets: 158 10*3/uL (ref 150–400)
RBC: 3.63 MIL/uL — ABNORMAL LOW (ref 4.22–5.81)
RDW: 14.4 % (ref 11.5–15.5)
WBC: 9 10*3/uL (ref 4.0–10.5)
nRBC: 0 % (ref 0.0–0.2)

## 2019-03-09 LAB — MAGNESIUM: Magnesium: 2 mg/dL (ref 1.7–2.4)

## 2019-03-09 MED ORDER — HEPARIN SOD (PORK) LOCK FLUSH 100 UNIT/ML IV SOLN
500.0000 [IU] | Freq: Once | INTRAVENOUS | Status: AC
  Administered 2019-03-09: 500 [IU] via INTRAVENOUS

## 2019-03-09 MED ORDER — SODIUM CHLORIDE 0.9% FLUSH
10.0000 mL | Freq: Once | INTRAVENOUS | Status: AC
  Administered 2019-03-09: 10 mL via INTRAVENOUS

## 2019-03-09 MED ORDER — NOVOLIN R FLEXPEN 100 UNIT/ML IJ SOPN: 5.0000 [IU] | PEN_INJECTOR | Freq: Every day | INTRAMUSCULAR | 2 refills | Status: DC

## 2019-03-09 NOTE — Patient Instructions (Signed)
[place] at [facility] Discharge Instructions  You were seen today by Dr. Delton Coombes. He went over your recent lab results. HOLD treatment today. He will see you back in 3 weeks for labs and follow up.   Thank you for choosing [place] at [facility] to provide your oncology and hematology care.  To afford each patient quality time with our provider, please arrive at least 15 minutes before your scheduled appointment time.   If you have a lab appointment with the [facility] please come in thru the  Main Entrance and check in at the main information desk  You need to re-schedule your appointment should you arrive 10 or more minutes late.  We strive to give you quality time with our providers, and arriving late affects you and other patients whose appointments are after yours.  Also, if you no show three or more times for appointments you may be dismissed from the clinic at the providers discretion.     Again, thank you for choosing [facility].  Our hope is that these requests will decrease the amount of time that you wait before being seen by our physicians.       _____________________________________________________________  Should you have questions after your visit to [facility], please contact our office at [phone] between the hours of 8:00 a.m. and 4:30 p.m.  Voicemails left after 4:00 p.m. will not be returned until the following business day.  For prescription refill requests, have your pharmacy contact our office and allow 72 hours.    Cancer Center Support Programs:   > Cancer Support Group  2nd Tuesday of the month 1pm-2pm, Journey Room

## 2019-03-09 NOTE — Progress Notes (Signed)
Labs reviewed with MD today. Patient requesting to not get treated for a while. Wants to take a break. Will hold treatment today per MD.   Vitals stable and discharged home from clinic via wheelchair. Follow up as scheduled.

## 2019-03-09 NOTE — Progress Notes (Signed)
[place], [name] [address]   CLINIC:  Medical Oncology/Hematology  PCP:  Tobe Sos, MD [address] [place] [address] [phone]   REASON FOR VISIT:  Follow-up for Colon Cancer  CURRENT THERAPY: Maintenance 5-FU/bevacizumab  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[place])  07/11/2016 Initial Diagnosis   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[place])   07/27/2017 -  Chemotherapy   The patient had palonosetron (ALOXI) injection 0.25 mg, 0.25 mg, Intravenous,  Once, 41 of 45 cycles Administration: 0.25 mg (07/27/2017), 0.25 mg (08/10/2017), 0.25 mg (08/24/2017), 0.25 mg (09/07/2017), 0.25 mg (09/21/2017), 0.25 mg (10/05/2017), 0.25 mg (10/19/2017), 0.25 mg (11/02/2017), 0.25 mg (11/16/2017), 0.25 mg (11/30/2017), 0.25 mg (12/14/2017), 0.25 mg (12/28/2017), 0.25 mg (01/12/2018), 0.25 mg (01/26/2018), 0.25 mg (02/09/2018), 0.25 mg (02/24/2018), 0.25 mg (03/10/2018), 0.25 mg (03/29/2018), 0.25 mg (04/12/2018), 0.25 mg (04/26/2018), 0.25 mg (05/10/2018), 0.25 mg (05/24/2018), 0.25 mg (06/07/2018), 0.25 mg (06/21/2018), 0.25 mg (07/05/2018), 0.25 mg (07/19/2018), 0.25 mg (08/03/2018), 0.25 mg (08/18/2018), 0.25 mg (09/01/2018), 0.25 mg (09/15/2018), 0.25 mg (09/29/2018), 0.25 mg (10/13/2018), 0.25 mg (10/27/2018), 0.25 mg (11/10/2018), 0.25 mg (11/24/2018), 0.25 mg (12/08/2018), 0.25 mg (12/22/2018), 0.25 mg (01/05/2019), 0.25 mg (01/19/2019), 0.25 mg (02/02/2019), 0.25 mg (02/22/2019) pegfilgrastim-cbqv (UDENYCA) injection 6 mg, 6 mg, Subcutaneous, Once, 7 of 7 cycles Administration: 6 mg (10/07/2017), 6 mg (10/21/2017), 6 mg (11/04/2017), 6 mg (11/18/2017), 6 mg (12/02/2017), 6 mg (12/16/2017), 6 mg (12/30/2017) bevacizumab-bvzr (ZIRABEV) chemo injection 400 mg, 5 mg/kg = 400 mg (100 % of original dose 5 mg/kg), Intravenous,  Once, 2 of 2 cycles Dose modification: 5 mg/kg (original dose 5 mg/kg, Cycle 35) bevacizumab (AVASTIN) 450 mg in sodium chloride 0.9  % 100 mL chemo infusion, 5 mg/kg = 450 mg, Intravenous,  Once, 35 of 35 cycles Dose modification: 4 mg/kg (80 % of original dose 5 mg/kg, Cycle 5, Reason: Provider Judgment), 5 mg/kg (original dose 5 mg/kg, Cycle 16, Reason: Other (see comments)), 5 mg/kg (original dose 5 mg/kg, Cycle 35) Administration: 450 mg (07/27/2017), 450 mg (08/10/2017), 450 mg (08/24/2017), 450 mg (09/07/2017), 450 mg (09/21/2017), 450 mg (10/05/2017), 450 mg (10/19/2017), 450 mg (11/02/2017), 450 mg (11/16/2017), 450 mg (11/30/2017), 450 mg (12/14/2017), 450 mg (12/28/2017), 400 mg (01/12/2018), 400 mg (01/26/2018), 400 mg (02/09/2018), 400 mg (02/24/2018), 400 mg (03/10/2018), 400 mg (03/29/2018), 400 mg (04/12/2018), 400 mg (04/26/2018), 400 mg (05/10/2018), 400 mg (05/24/2018), 400 mg (06/07/2018), 400 mg (06/21/2018), 400 mg (07/05/2018), 400 mg (07/19/2018), 400 mg (08/03/2018), 400 mg (08/18/2018), 400 mg (09/01/2018), 400 mg (09/15/2018), 400 mg (09/29/2018), 400 mg (10/13/2018), 400 mg (10/27/2018), 400 mg (11/10/2018), 400 mg (11/24/2018) irinotecan (CAMPTOSAR) 400 mg in dextrose 5 % 500 mL chemo infusion, 180 mg/m2 = 400 mg, Intravenous,  Once, 32 of 32 cycles Dose modification: 144 mg/m2 (80 % of original dose 180 mg/m2, Cycle 5, Reason: Provider Judgment), 135 mg/m2 (75 % of original dose 180 mg/m2, Cycle 30, Reason: Other (see comments), Comment: diarrhea) Administration: 400 mg (07/27/2017), 400 mg (08/10/2017), 400 mg (08/24/2017), 400 mg (09/07/2017), 320 mg (09/21/2017), 320 mg (10/05/2017), 320 mg (10/19/2017), 320 mg (11/02/2017), 320 mg (11/16/2017), 320 mg (11/30/2017), 320 mg (12/14/2017), 320 mg (12/28/2017), 320 mg (01/12/2018), 320 mg (01/26/2018), 320 mg (02/09/2018), 300 mg (02/24/2018), 300 mg (03/10/2018), 300 mg (03/29/2018), 300 mg (04/12/2018), 300 mg (04/26/2018), 300 mg (05/10/2018), 300 mg (05/24/2018), 300 mg (06/07/2018), 300 mg (06/21/2018), 300 mg (07/05/2018), 300 mg (07/19/2018), 300 mg (08/03/2018), 300  mg (08/18/2018), 300 mg (09/01/2018), 280 mg (09/15/2018), 280 mg  (09/29/2018), 280 mg (10/13/2018) leucovorin 800 mg in dextrose 5 % 250 mL infusion, 872 mg, Intravenous,  Once, 41 of 45 cycles Dose modification: 320 mg/m2 (80 % of original dose 400 mg/m2, Cycle 5, Reason: Provider Judgment) Administration: 800 mg (07/27/2017), 800 mg (08/10/2017), 800 mg (08/24/2017), 800 mg (09/07/2017), 700 mg (09/21/2017), 700 mg (10/05/2017), 700 mg (10/19/2017), 700 mg (11/02/2017), 700 mg (11/16/2017), 700 mg (11/30/2017), 700 mg (12/14/2017), 700 mg (12/28/2017), 700 mg (01/12/2018), 700 mg (01/26/2018), 700 mg (02/09/2018), 700 mg (02/24/2018), 700 mg (03/10/2018), 700 mg (03/29/2018), 700 mg (04/12/2018), 700 mg (04/26/2018), 700 mg (05/10/2018), 700 mg (05/24/2018), 700 mg (06/07/2018), 700 mg (06/21/2018), 700 mg (07/05/2018), 700 mg (07/19/2018), 700 mg (08/03/2018), 700 mg (08/18/2018), 700 mg (09/01/2018), 700 mg (09/15/2018), 700 mg (09/29/2018), 700 mg (10/13/2018), 700 mg (10/27/2018), 700 mg (11/10/2018), 700 mg (11/24/2018), 700 mg (12/08/2018), 700 mg (12/22/2018), 700 mg (01/05/2019), 700 mg (01/19/2019), 700 mg (02/02/2019), 700 mg (02/22/2019) fluorouracil (ADRUCIL) chemo injection 850 mg, 400 mg/m2 = 850 mg, Intravenous,  Once, 41 of 45 cycles Dose modification: 320 mg/m2 (80 % of original dose 400 mg/m2, Cycle 5, Reason: Provider Judgment) Administration: 850 mg (07/27/2017), 850 mg (08/10/2017), 850 mg (08/24/2017), 850 mg (09/07/2017), 700 mg (09/21/2017), 700 mg (10/05/2017), 700 mg (10/19/2017), 700 mg (11/02/2017), 700 mg (11/16/2017), 700 mg (11/30/2017), 700 mg (12/14/2017), 700 mg (12/28/2017), 700 mg (01/12/2018), 700 mg (01/26/2018), 700 mg (02/09/2018), 650 mg (02/24/2018), 650 mg (03/10/2018), 650 mg (03/29/2018), 650 mg (04/12/2018), 650 mg (04/26/2018), 650 mg (05/10/2018), 650 mg (05/24/2018), 650 mg (06/07/2018), 650 mg (06/21/2018), 650 mg (07/05/2018), 650 mg (07/19/2018), 650 mg (08/03/2018), 650 mg (08/18/2018), 650 mg (09/01/2018), 650 mg (09/15/2018), 650 mg (09/29/2018), 650 mg (10/13/2018), 650 mg (10/27/2018), 650 mg  (11/10/2018), 650 mg (11/24/2018), 650 mg (12/08/2018), 650 mg (12/22/2018), 650 mg (01/05/2019), 650 mg (01/19/2019), 650 mg (02/02/2019), 650 mg (02/22/2019) fosaprepitant (EMEND) 150 mg in sodium chloride 0.9 % 145 mL IVPB, , Intravenous,  Once, 7 of 11 cycles Administration:  (11/24/2018),  (12/08/2018),  (12/22/2018),  (01/05/2019),  (01/19/2019),  (02/02/2019),  (02/22/2019) fluorouracil (ADRUCIL) 5,250 mg in sodium chloride 0.9 % 145 mL chemo infusion, 2,400 mg/m2 = 5,250 mg, Intravenous, 1 Day/Dose, 41 of 45 cycles Dose modification: 1,920 mg/m2 (80 % of original dose 2,400 mg/m2, Cycle 5, Reason: Provider Judgment) Administration: 5,250 mg (07/27/2017), 5,250 mg (08/10/2017), 5,250 mg (08/24/2017), 5,250 mg (09/07/2017), 4,200 mg (09/21/2017), 4,200 mg (10/05/2017), 4,200 mg (10/19/2017), 4,200 mg (11/02/2017), 4,200 mg (11/16/2017), 4,200 mg (11/30/2017), 4,200 mg (12/14/2017), 4,200 mg (12/28/2017), 4,200 mg (01/12/2018), 4,200 mg (01/26/2018), 4,200 mg (02/09/2018), 3,950 mg (02/24/2018), 3,950 mg (03/10/2018), 3,950 mg (03/29/2018), 3,950 mg (04/12/2018), 3,950 mg (04/26/2018), 3,950 mg (05/10/2018), 3,950 mg (05/24/2018), 3,950 mg (06/07/2018), 3,950 mg (06/21/2018), 3,950 mg (07/05/2018), 3,950 mg (07/19/2018), 3,950 mg (08/03/2018), 3,950 mg (08/18/2018), 3,950 mg (09/01/2018), 3,950 mg (09/15/2018), 3,950 mg (09/29/2018), 3,950 mg (10/13/2018), 3,950 mg (10/27/2018), 3,950 mg (11/10/2018), 3,950 mg (11/24/2018), 3,950 mg (12/08/2018), 3,950 mg (12/22/2018), 3,950 mg (01/05/2019), 3,950 mg (01/19/2019), 3,950 mg (02/02/2019), 3,950 mg (02/22/2019) bevacizumab-bvzr (ZIRABEV) 400 mg in sodium chloride 0.9 % 100 mL chemo infusion, 5 mg/kg = 400 mg (100 % of original dose 5 mg/kg), Intravenous,  Once, 6 of 10 cycles Dose modification: 5 mg/kg (original dose 5 mg/kg, Cycle 36) Administration: 400 mg (12/08/2018), 400 mg (12/22/2018), 400 mg (01/05/2019), 400 mg (01/19/2019), 400 mg (02/02/2019), 400 mg (02/22/2019)  for chemotherapy treatment.  CANCER STAGING: Cancer Staging Malignant neoplasm of transverse colon [facility]) Staging form: Colon and Rectum, AJCC 8th Edition - Clinical: cT3, cN1a, cM1 - Signed by Twana First, MD on 03/09/2017 - Pathologic: No stage assigned - Unsigned    INTERVAL HISTORY:  Mr. Regnier 85 y.o. male seen for follow-up of stage IV colon Cancer.  Last treatment was on 02/22/2019 with dose reduced 5-FU and bevacizumab.  He reportedly felt very bad after last chemotherapy.  He had nausea and vomiting over the weekend.  He also reports some constipation.  He had some mouth sores.  Numbness in the hands and feet has been stable.  He has occasional dizziness.  Appetite is 100%.  Energy levels are 25%.  Denies any fevers or chills.  Denies any ER visits or hospitalizations.  REVIEW OF SYSTEMS:  Review of Systems  Gastrointestinal: Positive for diarrhea and vomiting.  Neurological: Positive for numbness.  All other systems reviewed and are negative.    PAST MEDICAL/SURGICAL HISTORY:  Past Medical History:  Diagnosis Date  . Chronic pulmonary embolism ([place]) 54/5625   compliction of the valve replacement surgery  . COPD (chronic obstructive pulmonary disease) ([place])   . Diabetes ([place]) 04/22/2016  . H/O aortic valve replacement 10/2008  . Hypercholesteremia   . Hypertension   . Pulmonary emboli ([place]) 10/2008  . Sleep apnea    Past Surgical History:  Procedure Laterality Date  . AORTIC VALVE REPLACEMENT     2010  . CARDIAC CATHETERIZATION  2010  . COLONOSCOPY N/A 06/05/2016   Procedure: COLONOSCOPY;  Surgeon: Rogene Houston, MD;  Location: [facility] ENDO SUITE;  Service: Endoscopy;  Laterality: N/A;  . PARTIAL COLECTOMY N/A 07/11/2016   Procedure: PARTIAL COLECTOMY;  Surgeon: Aviva Signs, MD;  Location: [facility] ORS;  Service: General;  Laterality: N/A;  . PORTACATH PLACEMENT N/A 08/13/2016   Procedure: INSERTION PORT-A-CATH LEFT SUBCLAVIAN;  Surgeon: Aviva Signs, MD;  Location: [facility] ORS;  Service: General;   Laterality: N/A;  . REPLACEMENT TOTAL KNEE     1996  . spenectomy     from trauma     SOCIAL HISTORY:  Social History   Socioeconomic History  . Marital status: Widowed    Spouse name: Not on file  . Number of children: Not on file  . Years of education: Not on file  . Highest education level: Not on file  Occupational History  . Not on file  Tobacco Use  . Smoking status: Former Smoker    Years: 15.00    Types: Cigarettes    Quit date: 1964    Years since quitting: 57.0  . Smokeless tobacco: Never Used  Substance and Sexual Activity  . Alcohol use: No  . Drug use: No  . Sexual activity: Yes  Other Topics Concern  . Not on file  Social History Narrative  . Not on file   Social Determinants of Health   Financial Resource Strain:   . Difficulty of Paying Living Expenses: Not on file  Food Insecurity:   . Worried About Charity fundraiser in the Last Year: Not on file  . Ran Out of Food in the Last Year: Not on file  Transportation Needs:   . Lack of Transportation (Medical): Not on file  . Lack of Transportation (Non-Medical): Not on file  Physical Activity:   . Days of Exercise per Week: Not on file  . Minutes of Exercise per Session: Not on file  Stress:   . Feeling of Stress :  Not on file  Social Connections:   . Frequency of Communication with Friends and Family: Not on file  . Frequency of Social Gatherings with Friends and Family: Not on file  . Attends Religious Services: Not on file  . Active Member of Clubs or Organizations: Not on file  . Attends Archivist Meetings: Not on file  . Marital Status: Not on file  Intimate Partner Violence:   . Fear of Current or Ex-Partner: Not on file  . Emotionally Abused: Not on file  . Physically Abused: Not on file  . Sexually Abused: Not on file    FAMILY HISTORY:  Family History  Problem Relation Age of Onset  . Colon cancer Neg Hx     CURRENT MEDICATIONS:  Outpatient Encounter Medications  as of 03/09/2019  Medication Sig Note  . atorvastatin (LIPITOR) 10 MG tablet Take 10 mg by mouth at bedtime.    . Cholecalciferol (VITAMIN D3) 5000 units CAPS Take 5,000 Units by mouth daily.    Marland Kitchen lidocaine-prilocaine (EMLA) cream Apply 1 application topically as needed.   Marland Kitchen lisinopril (PRINIVIL,ZESTRIL) 10 MG tablet Take 10 mg by mouth daily.   . metoprolol (LOPRESSOR) 50 MG tablet Take 50 mg by mouth 2 (two) times daily.   . Multiple Vitamins-Minerals (CENTRUM SILVER 50+MEN PO) Take 1 tablet by mouth daily.   Marland Kitchen NOVOFINE 32G X 6 MM MISC    . RELION GLUCOSE TEST STRIPS test strip USE 1 STRIP TO CHECK GLUCOSE ONCE DAILY   . vitamin B-12 (CYANOCOBALAMIN) 1000 MCG tablet Take 1,000 mcg by mouth daily.    . diphenoxylate-atropine (LOMOTIL) 2.5-0.025 MG tablet Take 1 tablet by mouth 4 (four) times daily as needed for diarrhea or loose stools. (Patient not taking: Reported on 03/09/2019)   . Insulin Regular Human (NOVOLIN R FLEXPEN) 100 UNIT/ML SOPN Inject 5 Units as directed daily. If sugar more than 150   . ondansetron (ZOFRAN ODT) 8 MG disintegrating tablet Take 1 tablet (8 mg total) by mouth every 8 (eight) hours as needed for nausea or vomiting. (Patient not taking: Reported on 03/09/2019)   . [DISCONTINUED] prochlorperazine (COMPAZINE) 10 MG tablet Take 1 tablet (10 mg total) by mouth every 6 (six) hours as needed (Nausea or vomiting). 08/05/2016: New Med   Facility-Administered Encounter Medications as of 03/09/2019  Medication  . sodium chloride flush (NS) 0.9 % injection 10 mL    ALLERGIES:  Allergies  Allergen Reactions  . Amoxicillin Hives    Has patient had a PCN reaction causing immediate rash, facial/tongue/throat swelling, SOB or lightheadedness with hypotension: No Has patient had a PCN reaction causing severe rash involving mucus membranes or skin necrosis: Yes Has patient had a PCN reaction that required hospitalization No Has patient had a PCN reaction occurring within the  last 10 years: No If all of the above answers are "NO", then may proceed with Cephalosporin use.   . Tamsulosin Hcl     Cant sleep, confusion      PHYSICAL EXAM:  ECOG Performance status: 1  Vitals:   03/09/19 0905  BP: 120/64  Pulse: 68  Resp: 18  Temp: 97.9 F (36.6 C)  SpO2: 99%   Filed Weights   03/09/19 0905  Weight: 158 lb 12.8 oz (72 kg)    Physical Exam Vitals reviewed.  Constitutional:      Appearance: Normal appearance.  HENT:     Head: Normocephalic.     Nose: Nose normal.     Mouth/Throat:  Mouth: Mucous membranes are moist.     Pharynx: Oropharynx is clear.  Eyes:     Extraocular Movements: Extraocular movements intact.     Conjunctiva/sclera: Conjunctivae normal.  Cardiovascular:     Rate and Rhythm: Normal rate and regular rhythm.     Pulses: Normal pulses.     Heart sounds: Normal heart sounds.  Pulmonary:     Effort: Pulmonary effort is normal.     Breath sounds: Normal breath sounds.  Abdominal:     General: Bowel sounds are normal.  Musculoskeletal:        General: Normal range of motion.     Cervical back: Normal range of motion.  Skin:    General: Skin is warm and dry.  Neurological:     General: No focal deficit present.     Mental Status: He is alert and oriented to person, place, and time.  Psychiatric:        Mood and Affect: Mood normal.        Behavior: Behavior normal.        Thought Content: Thought content normal.        Judgment: Judgment normal.      LABORATORY DATA:  I have reviewed the labs as listed.  CBC    Component Value Date/Time   WBC 9.0 03/09/2019 0901   RBC 3.63 (L) 03/09/2019 0901   HGB 12.7 (L) 03/09/2019 0901   HCT 39.5 03/09/2019 0901   PLT 158 03/09/2019 0901   MCV 108.8 (H) 03/09/2019 0901   MCH 35.0 (H) 03/09/2019 0901   MCHC 32.2 03/09/2019 0901   RDW 14.4 03/09/2019 0901   LYMPHSABS 2.7 03/09/2019 0901   MONOABS 1.0 03/09/2019 0901   EOSABS 0.4 03/09/2019 0901   BASOSABS 0.0  03/09/2019 0901   CMP Latest Ref Rng & Units 03/09/2019 02/22/2019 02/02/2019  Glucose 70 - 99 mg/dL 214(H) 119(H) 119(H)  BUN 8 - 23 mg/dL '19 17 13  ' Creatinine 0.61 - 1.24 mg/dL 0.88 0.78 0.83  Sodium 135 - 145 mmol/L 137 139 137  Potassium 3.5 - 5.1 mmol/L 4.5 4.2 4.2  Chloride 98 - 111 mmol/L 100 104 101  CO2 22 - 32 mmol/L '26 27 28  ' Calcium 8.9 - 10.3 mg/dL 9.4 9.2 8.9  Total Protein 6.5 - 8.1 g/dL 6.7 6.8 6.3(L)  Total Bilirubin 0.3 - 1.2 mg/dL 0.9 0.5 0.8  Alkaline Phos 38 - 126 U/L 42 46 46  AST 15 - 41 U/L '27 29 23  ' ALT 0 - 44 U/L '25 28 23     ' I have independently reviewed his scans and discussed with the patient.   ASSESSMENT & PLAN:   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[place]) 1.  Stage IV colon cancer (pT3 pN1 M1) MSI high: - K-ras mutation negative, BRAF V600 E+, PIK3CA, TP53, CDKN2A -12 cycles of FOLFOX from 08/26/2016 through 01/27/2017 -Opdivo from 02/23/2017 through 07/13/2017 -FOLFIRI and bevacizumab from 07/27/2017 through 10/13/2018 - CT CAP from 11/08/2018 showed stable disease and abdomen and pelvis.  Mild progression of linear reticulonodular opacities in the lungs likely atypical infectious process. -5-FU and bevacizumab maintenance started on 10/27/2018. -CT CAP on 02/01/2019 showed 12 mm short axis subcarinal lymph node improved.  No suspicious findings for metastatic disease in the abdomen or pelvis.  Stable nodularity in the left upper abdomen and right lower quadrant. -He did not feel well after the last treatment.  He felt more tired.  He did have more nausea and vomiting. -He lost  about 2 pounds.  He requests some time off during the holidays.  He was also inquiring if he could get few months off therapy.  We have also talked about the rationale of continuing maintenance therapy to prevent relapses. -I have reviewed his labs.  His CEA 7.9. -We will hold off his treatment today.  I plan to reevaluate him in 3 weeks with labs.  2.  Peripheral neuropathy: -He  is not on gabapentin anymore.  His numbness has improved.  3.  Diarrhea: -He has intermittent diarrhea after chemotherapy which is controlled with Lomotil.  4.  Diabetes: -His blood sugar today is 214. -I have renewed his Novolin R FlexPen.  He will take 5 units if the blood pressure goes above 150.   Total time spent is 25 minutes with more than 50% of the time spent discussing treatment plan, counseling and coordination of care.  Orders placed this encounter:  No orders of the defined types were placed in this encounter.     [place] [phone]

## 2019-03-10 LAB — CEA: CEA: 7.9 ng/mL — ABNORMAL HIGH (ref 0.0–4.7)

## 2019-03-11 ENCOUNTER — Encounter (HOSPITAL_COMMUNITY): Payer: Medicare Other

## 2019-03-16 ENCOUNTER — Encounter (HOSPITAL_COMMUNITY): Payer: Self-pay | Admitting: Hematology

## 2019-03-16 NOTE — Assessment & Plan Note (Signed)
1.  Stage IV colon cancer (pT3 pN1 M1) MSI high: - K-ras mutation negative, BRAF V600 E+, PIK3CA, TP53, CDKN2A -12 cycles of FOLFOX from 08/26/2016 through 01/27/2017 -Opdivo from 02/23/2017 through 07/13/2017 -FOLFIRI and bevacizumab from 07/27/2017 through 10/13/2018 - CT CAP from 11/08/2018 showed stable disease and abdomen and pelvis.  Mild progression of linear reticulonodular opacities in the lungs likely atypical infectious process. -5-FU and bevacizumab maintenance started on 10/27/2018. -CT CAP on 02/01/2019 showed 12 mm short axis subcarinal lymph node improved.  No suspicious findings for metastatic disease in the abdomen or pelvis.  Stable nodularity in the left upper abdomen and right lower quadrant. -He did not feel well after the last treatment.  He felt more tired.  He did have more nausea and vomiting. -He lost about 2 pounds.  He requests some time off during the holidays.  He was also inquiring if he could get few months off therapy.  We have also talked about the rationale of continuing maintenance therapy to prevent relapses. -I have reviewed his labs.  His CEA 7.9. -We will hold off his treatment today.  I plan to reevaluate him in 3 weeks with labs.  2.  Peripheral neuropathy: -He is not on gabapentin anymore.  His numbness has improved.  3.  Diarrhea: -He has intermittent diarrhea after chemotherapy which is controlled with Lomotil.  4.  Diabetes: -His blood sugar today is 214. -I have renewed his Novolin R FlexPen.  He will take 5 units if the blood pressure goes above 150.

## 2019-03-29 ENCOUNTER — Other Ambulatory Visit: Payer: Self-pay

## 2019-03-30 ENCOUNTER — Inpatient Hospital Stay (HOSPITAL_COMMUNITY): Payer: Medicare Other | Attending: Hematology | Admitting: Hematology

## 2019-03-30 ENCOUNTER — Inpatient Hospital Stay (HOSPITAL_COMMUNITY): Payer: Medicare Other

## 2019-03-30 ENCOUNTER — Encounter (HOSPITAL_COMMUNITY): Payer: Self-pay | Admitting: Hematology

## 2019-03-30 VITALS — BP 153/76 | HR 57 | Temp 97.9°F | Resp 14 | Wt 165.7 lb

## 2019-03-30 DIAGNOSIS — C184 Malignant neoplasm of transverse colon: Secondary | ICD-10-CM

## 2019-03-30 DIAGNOSIS — R197 Diarrhea, unspecified: Secondary | ICD-10-CM | POA: Diagnosis not present

## 2019-03-30 DIAGNOSIS — Z79899 Other long term (current) drug therapy: Secondary | ICD-10-CM | POA: Insufficient documentation

## 2019-03-30 DIAGNOSIS — G629 Polyneuropathy, unspecified: Secondary | ICD-10-CM | POA: Diagnosis not present

## 2019-03-30 DIAGNOSIS — E119 Type 2 diabetes mellitus without complications: Secondary | ICD-10-CM | POA: Diagnosis not present

## 2019-03-30 LAB — COMPREHENSIVE METABOLIC PANEL
ALT: 26 U/L (ref 0–44)
AST: 28 U/L (ref 15–41)
Albumin: 3.8 g/dL (ref 3.5–5.0)
Alkaline Phosphatase: 46 U/L (ref 38–126)
Anion gap: 9 (ref 5–15)
BUN: 13 mg/dL (ref 8–23)
CO2: 29 mmol/L (ref 22–32)
Calcium: 9.5 mg/dL (ref 8.9–10.3)
Chloride: 101 mmol/L (ref 98–111)
Creatinine, Ser: 0.8 mg/dL (ref 0.61–1.24)
GFR calc Af Amer: >60 mL/min (ref >60)
GFR calc non Af Amer: >60 mL/min (ref >60)
Glucose, Bld: 168 mg/dL — ABNORMAL HIGH (ref 70–99)
Potassium: 4.9 mmol/L (ref 3.5–5.1)
Sodium: 139 mmol/L (ref 135–145)
Total Bilirubin: 1.1 mg/dL (ref 0.3–1.2)
Total Protein: 7 g/dL (ref 6.5–8.1)

## 2019-03-30 LAB — CBC WITH DIFFERENTIAL/PLATELET
Abs Immature Granulocytes: 0.02 10*3/uL (ref 0.00–0.07)
Basophils Absolute: 0.1 10*3/uL (ref 0.0–0.1)
Basophils Relative: 1 %
Eosinophils Absolute: 0.7 10*3/uL — ABNORMAL HIGH (ref 0.0–0.5)
Eosinophils Relative: 7 %
HCT: 42.4 % (ref 39.0–52.0)
Hemoglobin: 13.3 g/dL (ref 13.0–17.0)
Immature Granulocytes: 0 %
Lymphocytes Relative: 32 %
Lymphs Abs: 3.4 10*3/uL (ref 0.7–4.0)
MCH: 34.2 pg — ABNORMAL HIGH (ref 26.0–34.0)
MCHC: 31.4 g/dL (ref 30.0–36.0)
MCV: 109 fL — ABNORMAL HIGH (ref 80.0–100.0)
Monocytes Absolute: 1.1 10*3/uL — ABNORMAL HIGH (ref 0.1–1.0)
Monocytes Relative: 11 %
Neutro Abs: 5.3 10*3/uL (ref 1.7–7.7)
Neutrophils Relative %: 49 %
Platelets: 203 10*3/uL (ref 150–400)
RBC: 3.89 MIL/uL — ABNORMAL LOW (ref 4.22–5.81)
RDW: 13.5 % (ref 11.5–15.5)
WBC: 10.6 10*3/uL — ABNORMAL HIGH (ref 4.0–10.5)
nRBC: 0 % (ref 0.0–0.2)

## 2019-03-30 LAB — MAGNESIUM: Magnesium: 2 mg/dL (ref 1.7–2.4)

## 2019-03-30 NOTE — Patient Instructions (Addendum)
[place] at [facility] Discharge Instructions  You were seen today by Dr. Delton Coombes. He went over your recent lab results. He will see you back in 1 month for labs, CT scan and follow up.   Thank you for choosing [place] at [facility] to provide your oncology and hematology care.  To afford each patient quality time with our provider, please arrive at least 15 minutes before your scheduled appointment time.   If you have a lab appointment with the [facility] please come in thru the  Main Entrance and check in at the main information desk  You need to re-schedule your appointment should you arrive 10 or more minutes late.  We strive to give you quality time with our providers, and arriving late affects you and other patients whose appointments are after yours.  Also, if you no show three or more times for appointments you may be dismissed from the clinic at the providers discretion.     Again, thank you for choosing [facility].  Our hope is that these requests will decrease the amount of time that you wait before being seen by our physicians.       _____________________________________________________________  Should you have questions after your visit to [facility], please contact our office at [phone] between the hours of 8:00 a.m. and 4:30 p.m.  Voicemails left after 4:00 p.m. will not be returned until the following business day.  For prescription refill requests, have your pharmacy contact our office and allow 72 hours.    Cancer Center Support Programs:   > Cancer Support Group  2nd Tuesday of the month 1pm-2pm, Journey Room

## 2019-03-30 NOTE — Progress Notes (Signed)
[facility] [address] [place] [address]   CLINIC:  Medical Oncology/Hematology  PCP:  Tobe Sos, [place] [id number] [phone]   REASON FOR VISIT:  Follow-up for Colon Cancer  CURRENT THERAPY: Observation.  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[place])   07/27/2017 -  Chemotherapy   The patient had palonosetron (ALOXI) injection 0.25 mg, 0.25 mg, Intravenous,  Once, 41 of 45 cycles Administration: 0.25 mg (07/27/2017), 0.25 mg (08/10/2017), 0.25 mg (08/24/2017), 0.25 mg (09/07/2017), 0.25 mg (09/21/2017), 0.25 mg (10/05/2017), 0.25 mg (10/19/2017), 0.25 mg (11/02/2017), 0.25 mg (11/16/2017), 0.25 mg (11/30/2017), 0.25 mg (12/14/2017), 0.25 mg (12/28/2017), 0.25 mg (01/12/2018), 0.25 mg (01/26/2018), 0.25 mg (02/09/2018), 0.25 mg (02/24/2018), 0.25 mg (03/10/2018), 0.25 mg (03/29/2018), 0.25 mg (04/12/2018), 0.25 mg (04/26/2018), 0.25 mg (05/10/2018), 0.25 mg (05/24/2018), 0.25 mg (06/07/2018), 0.25 mg (06/21/2018), 0.25 mg (07/05/2018), 0.25 mg (07/19/2018), 0.25 mg (08/03/2018), 0.25 mg (08/18/2018), 0.25 mg (09/01/2018), 0.25 mg (09/15/2018), 0.25 mg (09/29/2018), 0.25 mg (10/13/2018), 0.25 mg (10/27/2018), 0.25 mg (11/10/2018), 0.25 mg (11/24/2018), 0.25 mg (12/08/2018), 0.25 mg (12/22/2018), 0.25 mg (01/05/2019), 0.25 mg (01/19/2019), 0.25 mg (02/02/2019), 0.25 mg (02/22/2019) pegfilgrastim-cbqv (UDENYCA) injection 6 mg, 6 mg, Subcutaneous, Once, 7 of 7 cycles Administration: 6 mg (10/07/2017), 6 mg (10/21/2017), 6 mg (11/04/2017), 6 mg (11/18/2017), 6 mg (12/02/2017), 6 mg (12/16/2017), 6 mg (12/30/2017) bevacizumab-bvzr (ZIRABEV) chemo injection 400 mg, 5 mg/kg = 400 mg (100 % of original dose 5 mg/kg), Intravenous,  Once, 2 of 2 cycles Dose modification: 5 mg/kg (original dose 5 mg/kg, Cycle 35) bevacizumab (AVASTIN) 450 mg in sodium chloride 0.9 % 100 mL chemo  infusion, 5 mg/kg = 450 mg, Intravenous,  Once, 35 of 35 cycles Dose modification: 4 mg/kg (80 % of original dose 5 mg/kg, Cycle 5, Reason: Provider Judgment), 5 mg/kg (original dose 5 mg/kg, Cycle 16, Reason: Other (see comments)), 5 mg/kg (original dose 5 mg/kg, Cycle 35) Administration: 450 mg (07/27/2017), 450 mg (08/10/2017), 450 mg (08/24/2017), 450 mg (09/07/2017), 450 mg (09/21/2017), 450 mg (10/05/2017), 450 mg (10/19/2017), 450 mg (11/02/2017), 450 mg (11/16/2017), 450 mg (11/30/2017), 450 mg (12/14/2017), 450 mg (12/28/2017), 400 mg (01/12/2018), 400 mg (01/26/2018), 400 mg (02/09/2018), 400 mg (02/24/2018), 400 mg (03/10/2018), 400 mg (03/29/2018), 400 mg (04/12/2018), 400 mg (04/26/2018), 400 mg (05/10/2018), 400 mg (05/24/2018), 400 mg (06/07/2018), 400 mg (06/21/2018), 400 mg (07/05/2018), 400 mg (07/19/2018), 400 mg (08/03/2018), 400 mg (08/18/2018), 400 mg (09/01/2018), 400 mg (09/15/2018), 400 mg (09/29/2018), 400 mg (10/13/2018), 400 mg (10/27/2018), 400 mg (11/10/2018), 400 mg (11/24/2018) irinotecan (CAMPTOSAR) 400 mg in dextrose 5 % 500 mL chemo infusion, 180 mg/m2 = 400 mg, Intravenous,  Once, 32 of 32 cycles Dose modification: 144 mg/m2 (80 % of original dose 180 mg/m2, Cycle 5, Reason: Provider Judgment), 135 mg/m2 (75 % of original dose 180 mg/m2, Cycle 30, Reason: Other (see comments), Comment: diarrhea) Administration: 400 mg (07/27/2017), 400 mg (08/10/2017), 400 mg (08/24/2017), 400 mg (09/07/2017), 320 mg (09/21/2017), 320 mg (10/05/2017), 320 mg (10/19/2017), 320 mg (11/02/2017), 320 mg (11/16/2017), 320 mg (11/30/2017), 320 mg (12/14/2017), 320 mg (12/28/2017), 320 mg (01/12/2018), 320 mg (01/26/2018), 320 mg (02/09/2018), 300 mg (02/24/2018), 300 mg (03/10/2018), 300 mg (03/29/2018), 300 mg (04/12/2018), 300 mg (04/26/2018), 300 mg (05/10/2018), 300 mg (05/24/2018), 300 mg (06/07/2018), 300 mg (06/21/2018), 300 mg (07/05/2018), 300 mg (07/19/2018), 300 mg (08/03/2018), 300  mg (08/18/2018), 300 mg (09/01/2018), 280 mg (09/15/2018), 280 mg (09/29/2018),  280 mg (10/13/2018) leucovorin 800 mg in dextrose 5 % 250 mL infusion, 872 mg, Intravenous,  Once, 41 of 45 cycles Dose modification: 320 mg/m2 (80 % of original dose 400 mg/m2, Cycle 5, Reason: Provider Judgment) Administration: 800 mg (07/27/2017), 800 mg (08/10/2017), 800 mg (08/24/2017), 800 mg (09/07/2017), 700 mg (09/21/2017), 700 mg (10/05/2017), 700 mg (10/19/2017), 700 mg (11/02/2017), 700 mg (11/16/2017), 700 mg (11/30/2017), 700 mg (12/14/2017), 700 mg (12/28/2017), 700 mg (01/12/2018), 700 mg (01/26/2018), 700 mg (02/09/2018), 700 mg (02/24/2018), 700 mg (03/10/2018), 700 mg (03/29/2018), 700 mg (04/12/2018), 700 mg (04/26/2018), 700 mg (05/10/2018), 700 mg (05/24/2018), 700 mg (06/07/2018), 700 mg (06/21/2018), 700 mg (07/05/2018), 700 mg (07/19/2018), 700 mg (08/03/2018), 700 mg (08/18/2018), 700 mg (09/01/2018), 700 mg (09/15/2018), 700 mg (09/29/2018), 700 mg (10/13/2018), 700 mg (10/27/2018), 700 mg (11/10/2018), 700 mg (11/24/2018), 700 mg (12/08/2018), 700 mg (12/22/2018), 700 mg (01/05/2019), 700 mg (01/19/2019), 700 mg (02/02/2019), 700 mg (02/22/2019) fluorouracil (ADRUCIL) chemo injection 850 mg, 400 mg/m2 = 850 mg, Intravenous,  Once, 41 of 45 cycles Dose modification: 320 mg/m2 (80 % of original dose 400 mg/m2, Cycle 5, Reason: Provider Judgment) Administration: 850 mg (07/27/2017), 850 mg (08/10/2017), 850 mg (08/24/2017), 850 mg (09/07/2017), 700 mg (09/21/2017), 700 mg (10/05/2017), 700 mg (10/19/2017), 700 mg (11/02/2017), 700 mg (11/16/2017), 700 mg (11/30/2017), 700 mg (12/14/2017), 700 mg (12/28/2017), 700 mg (01/12/2018), 700 mg (01/26/2018), 700 mg (02/09/2018), 650 mg (02/24/2018), 650 mg (03/10/2018), 650 mg (03/29/2018), 650 mg (04/12/2018), 650 mg (04/26/2018), 650 mg (05/10/2018), 650 mg (05/24/2018), 650 mg (06/07/2018), 650 mg (06/21/2018), 650 mg (07/05/2018), 650 mg (07/19/2018), 650 mg (08/03/2018), 650 mg (08/18/2018), 650 mg (09/01/2018), 650 mg (09/15/2018), 650 mg (09/29/2018), 650 mg (10/13/2018), 650 mg (10/27/2018), 650 mg (11/10/2018), 650 mg  (11/24/2018), 650 mg (12/08/2018), 650 mg (12/22/2018), 650 mg (01/05/2019), 650 mg (01/19/2019), 650 mg (02/02/2019), 650 mg (02/22/2019) fosaprepitant (EMEND) 150 mg in sodium chloride 0.9 % 145 mL IVPB, , Intravenous,  Once, 7 of 11 cycles Administration:  (11/24/2018),  (12/08/2018),  (12/22/2018),  (01/05/2019),  (01/19/2019),  (02/02/2019),  (02/22/2019) fluorouracil (ADRUCIL) 5,250 mg in sodium chloride 0.9 % 145 mL chemo infusion, 2,400 mg/m2 = 5,250 mg, Intravenous, 1 Day/Dose, 41 of 45 cycles Dose modification: 1,920 mg/m2 (80 % of original dose 2,400 mg/m2, Cycle 5, Reason: Provider Judgment) Administration: 5,250 mg (07/27/2017), 5,250 mg (08/10/2017), 5,250 mg (08/24/2017), 5,250 mg (09/07/2017), 4,200 mg (09/21/2017), 4,200 mg (10/05/2017), 4,200 mg (10/19/2017), 4,200 mg (11/02/2017), 4,200 mg (11/16/2017), 4,200 mg (11/30/2017), 4,200 mg (12/14/2017), 4,200 mg (12/28/2017), 4,200 mg (01/12/2018), 4,200 mg (01/26/2018), 4,200 mg (02/09/2018), 3,950 mg (02/24/2018), 3,950 mg (03/10/2018), 3,950 mg (03/29/2018), 3,950 mg (04/12/2018), 3,950 mg (04/26/2018), 3,950 mg (05/10/2018), 3,950 mg (05/24/2018), 3,950 mg (06/07/2018), 3,950 mg (06/21/2018), 3,950 mg (07/05/2018), 3,950 mg (07/19/2018), 3,950 mg (08/03/2018), 3,950 mg (08/18/2018), 3,950 mg (09/01/2018), 3,950 mg (09/15/2018), 3,950 mg (09/29/2018), 3,950 mg (10/13/2018), 3,950 mg (10/27/2018), 3,950 mg (11/10/2018), 3,950 mg (11/24/2018), 3,950 mg (12/08/2018), 3,950 mg (12/22/2018), 3,950 mg (01/05/2019), 3,950 mg (01/19/2019), 3,950 mg (02/02/2019), 3,950 mg (02/22/2019) bevacizumab-bvzr (ZIRABEV) 400 mg in sodium chloride 0.9 % 100 mL chemo infusion, 5 mg/kg = 400 mg (100 % of original dose 5 mg/kg), Intravenous,  Once, 6 of 10 cycles Dose modification: 5 mg/kg (original dose 5 mg/kg, Cycle 36) Administration: 400 mg (12/08/2018), 400 mg (12/22/2018), 400 mg (01/05/2019), 400 mg (01/19/2019), 400 mg (02/02/2019), 400 mg (02/22/2019)  for chemotherapy treatment.  CANCER  STAGING: Cancer Staging Malignant neoplasm of transverse colon [facility]) Staging form: Colon and Rectum, AJCC 8th Edition - Clinical: cT3, cN1a, cM1 - Signed by Twana First, MD on 03/09/2017 - Pathologic: No stage assigned - Unsigned    INTERVAL HISTORY:  Kevin Kirk 86 y.o. male seen for follow-up of stage IV colon cancer.  Reports appetite of 100%.  Energy levels are 75%.  Last treatment was on 02/22/2019.  Denies any nausea or vomiting.  Reports complete resolution of diarrhea.  He is able to get around well.  Has occasional dizziness.  He reports he has not been requiring any insulin.  Numbness in the hands and feet has been stable.  REVIEW OF SYSTEMS:  Review of Systems  Neurological: Positive for numbness.  All other systems reviewed and are negative.    PAST MEDICAL/SURGICAL HISTORY:  Past Medical History:  Diagnosis Date   Chronic pulmonary embolism ([place]) 62/9528   compliction of the valve replacement surgery   COPD (chronic obstructive pulmonary disease) ([place])    Diabetes ([place]) 04/22/2016   H/O aortic valve replacement 10/2008   Hypercholesteremia    Hypertension    Pulmonary emboli ([place]) 10/2008   Sleep apnea    Past Surgical History:  Procedure Laterality Date   AORTIC VALVE REPLACEMENT     2010   CARDIAC CATHETERIZATION  2010   COLONOSCOPY N/A 06/05/2016   Procedure: COLONOSCOPY;  Surgeon: Rogene Houston, MD;  Location: [facility] END[facility];  Service: Endoscopy;  Laterality: N/A;   PARTIAL COLECTOMY N/A 07/11/2016   Procedure: PARTIAL COLECTOMY;  Surgeon: Aviva Signs, MD;  Location: [facility] ORS;  Service: General;  Laterality: N/A;   PORTACATH PLACEMENT N/A 08/13/2016   Procedure: INSERTION PORT-A-CATH LEFT SUBCLAVIAN;  Surgeon: Aviva Signs, MD;  Location: [facility] ORS;  Service: General;  Laterality: N/A;   REPLACEMENT TOTAL KNEE     1996   spenectomy     from trauma     SOCIAL HISTORY:  Social History   Socioeconomic History   Marital status: Widowed     Spouse name: Not on file   Number of children: Not on file   Years of education: Not on file   Highest education level: Not on file  Occupational History   Not on file  Tobacco Use   Smoking status: Former Smoker    Years: 15.00    Types: Cigarettes    Quit date: 1964    Years since quitting: 57.0   Smokeless tobacco: Never Used  Substance and Sexual Activity   Alcohol use: No   Drug use: No   Sexual activity: Yes  Other Topics Concern   Not on file  Social History Narrative   Not on file   Social Determinants of Health   Financial Resource Strain:    Difficulty of Paying Living Expenses: Not on file  Food Insecurity:    Worried About Charity fundraiser in the Last Year: Not on file   [employer] of Food in the Last Year: Not on file  Transportation Needs:    Lack of Transportation (Medical): Not on file   Lack of Transportation (Non-Medical): Not on file  Physical Activity:    Days of Exercise per Week: Not on file   Minutes of Exercise per Session: Not on file  Stress:    Feeling of Stress : Not on file  Social Connections:    Frequency of Communication with Friends and Family: Not on file   Frequency of Social Gatherings  with Friends and Family: Not on file   Attends Religious Services: Not on file   Active Member of Clubs or Organizations: Not on file   Attends Archivist Meetings: Not on file   Marital Status: Not on file  Intimate Partner Violence:    Fear of Current or Ex-Partner: Not on file   Emotionally Abused: Not on file   Physically Abused: Not on file   Sexually Abused: Not on file    FAMILY HISTORY:  Family History  Problem Relation Age of Onset   Colon cancer Neg Hx     CURRENT MEDICATIONS:  Outpatient Encounter Medications as of 03/30/2019  Medication Sig Note   atorvastatin (LIPITOR) 10 MG tablet Take 10 mg by mouth at bedtime.     Cholecalciferol (VITAMIN D3) 5000 units CAPS Take 5,000 Units by mouth  daily.     diphenoxylate-atropine (LOMOTIL) 2.5-0.025 MG tablet Take 1 tablet by mouth 4 (four) times daily as needed for diarrhea or loose stools. (Patient not taking: Reported on 03/09/2019)    Insulin Regular Human (NOVOLIN R FLEXPEN) 100 UNIT/ML SOPN Inject 5 Units as directed daily. If sugar more than 150    lidocaine-prilocaine (EMLA) cream Apply 1 application topically as needed.    lisinopril (PRINIVIL,ZESTRIL) 10 MG tablet Take 10 mg by mouth daily.    metoprolol (LOPRESSOR) 50 MG tablet Take 50 mg by mouth 2 (two) times daily.    Multiple Vitamins-Minerals (CENTRUM SILVER 50+MEN PO) Take 1 tablet by mouth daily.    NOVOFINE 32G X 6 MM MISC     ondansetron (ZOFRAN ODT) 8 MG disintegrating tablet Take 1 tablet (8 mg total) by mouth every 8 (eight) hours as needed for nausea or vomiting. (Patient not taking: Reported on 03/09/2019)    RELION GLUCOSE TEST STRIPS test strip USE 1 STRIP TO CHECK GLUCOSE ONCE DAILY    vitamin B-12 (CYANOCOBALAMIN) 1000 MCG tablet Take 1,000 mcg by mouth daily.     [DISCONTINUED] prochlorperazine (COMPAZINE) 10 MG tablet Take 1 tablet (10 mg total) by mouth every 6 (six) hours as needed (Nausea or vomiting). 08/05/2016: New Med   Facility-Administered Encounter Medications as of 03/30/2019  Medication   sodium chloride flush (NS) 0.9 % injection 10 mL    ALLERGIES:  Allergies  Allergen Reactions   Amoxicillin Hives    Has patient had a PCN reaction causing immediate rash, facial/tongue/throat swelling, SOB or lightheadedness with hypotension: No Has patient had a PCN reaction causing severe rash involving mucus membranes or skin necrosis: Yes Has patient had a PCN reaction that required hospitalization No Has patient had a PCN reaction occurring within the last 10 years: No If all of the above answers are "NO", then may proceed with Cephalosporin use.    Tamsulosin Hcl     Cant sleep, confusion      PHYSICAL EXAM:  ECOG Performance  status: 1  Vitals:   03/30/19 1159  BP: (!) 153/76  Pulse: (!) 57  Resp: 14  Temp: 97.9 F (36.6 C)  SpO2: 99%   Filed Weights   03/30/19 1159  Weight: 165 lb 11.2 oz (75.2 kg)    Physical Exam Vitals reviewed.  Constitutional:      Appearance: Normal appearance.  HENT:     Head: Normocephalic.     Nose: Nose normal.     Mouth/Throat:     Mouth: Mucous membranes are moist.     Pharynx: Oropharynx is clear.  Eyes:     Extraocular  Movements: Extraocular movements intact.     Conjunctiva/sclera: Conjunctivae normal.  Cardiovascular:     Rate and Rhythm: Normal rate and regular rhythm.     Pulses: Normal pulses.     Heart sounds: Normal heart sounds.  Pulmonary:     Effort: Pulmonary effort is normal.     Breath sounds: Normal breath sounds.  Abdominal:     General: Bowel sounds are normal.  Musculoskeletal:        General: Normal range of motion.     Cervical back: Normal range of motion.  Skin:    General: Skin is warm and dry.  Neurological:     General: No focal deficit present.     Mental Status: He is alert and oriented to person, place, and time.  Psychiatric:        Mood and Affect: Mood normal.        Behavior: Behavior normal.        Thought Content: Thought content normal.        Judgment: Judgment normal.      LABORATORY DATA:  I have reviewed the labs as listed.  CBC    Component Value Date/Time   WBC 10.6 (H) 03/30/2019 1058   RBC 3.89 (L) 03/30/2019 1058   HGB 13.3 03/30/2019 1058   HCT 42.4 03/30/2019 1058   PLT 203 03/30/2019 1058   MCV 109.0 (H) 03/30/2019 1058   MCH 34.2 (H) 03/30/2019 1058   MCHC 31.4 03/30/2019 1058   RDW 13.5 03/30/2019 1058   LYMPHSABS 3.4 03/30/2019 1058   MONOABS 1.1 (H) 03/30/2019 1058   EOSABS 0.7 (H) 03/30/2019 1058   BASOSABS 0.1 03/30/2019 1058   CMP Latest Ref Rng & Units 03/30/2019 03/09/2019 02/22/2019  Glucose 70 - 99 mg/dL 168(H) 214(H) 119(H)  BUN 8 - 23 mg/dL _0 Creatinine 0.61 - 1.24  mg/dL 0.80 0.88 0.78  Sodium 135 - 145 mmol/L 139 137 139  Potassium 3.5 - 5.1 mmol/L 4.9 4.5 4.2  Chloride 98 - 111 mmol/L 101 100 104  CO2 22 - 32 mmol/L _1 Calcium 8.9 - 10.3 mg/dL 9.5 9.4 9.2  Total Protein 6.5 - 8.1 g/dL 7.0 6.7 6.8  Total Bilirubin 0.3 - 1.2 mg/dL 1.1 0.9 0.5  Alkaline Phos 38 - 126 U/L 46 42 46  AST 15 - 41 U/L _2 ALT 0 - 44 U/L _3 I have independently reviewed his scans and discussed with the patient.   ASSESSMENT & PLAN:   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[place]) 1.  Stage IV colon cancer (pT3 pN1 M1) MSI high: - K-ras mutation negative, BRAF V600 E+, PIK3CA, TP53, CDKN2A -12 cycles of FOLFOX from 08/26/2016 through 01/27/2017 -Opdivo from 02/23/2017 through 07/13/2017 -FOLFIRI and bevacizumab from 07/27/2017 through 10/13/2018 -5-FU and bevacizumab maintenance from 10/27/2018 through 02/22/2019. -CT CAP on 02/01/2019 showed 12 mm short axis subcarinal lymph node improved.  No suspicious findings for metastatic disease in the abdomen or pelvis.  Stable nodularity in the left upper abdomen and right lower quadrant. -He decided to take chemo holiday for the time being. -For the past few weeks he has been off of chemo, he felt wonderful and has gained about 7 pounds.  He reported complete resolution of his diarrhea and nausea.  He is able to get around well. -His blood work was grossly within normal limits.  His CEA on 03/09/2019 was 7.9. -I have recommended repeat  CEA level and a CT scan of the chest, abdomen and pelvis in 1 month.  2.  Peripheral neuropathy: -He is not on gabapentin anymore.  His numbness has improved.  3.  Diarrhea: -He reports his diarrhea has completely resolved since we stopped chemotherapy.  4.  Diabetes: -He was supposed to be on Novolin R FlexPen with 5 units as needed. -He reported that he is not requiring any insulin at this time.  Orders placed this encounter:  Orders Placed This Encounter  Procedures    CT Abdomen Pelvis W Contrast   CT Chest W Contrast   CBC with Differential/Platelet   Comprehensive metabolic panel   CEA      [place] [phone]

## 2019-03-30 NOTE — Assessment & Plan Note (Signed)
1.  Stage IV colon cancer (pT3 pN1 M1) MSI high: - K-ras mutation negative, BRAF V600 E+, PIK3CA, TP53, CDKN2A -12 cycles of FOLFOX from 08/26/2016 through 01/27/2017 -Opdivo from 02/23/2017 through 07/13/2017 -FOLFIRI and bevacizumab from 07/27/2017 through 10/13/2018 -5-FU and bevacizumab maintenance from 10/27/2018 through 02/22/2019. -CT CAP on 02/01/2019 showed 12 mm short axis subcarinal lymph node improved.  No suspicious findings for metastatic disease in the abdomen or pelvis.  Stable nodularity in the left upper abdomen and right lower quadrant. -He decided to take chemo holiday for the time being. -For the past few weeks he has been off of chemo, he felt wonderful and has gained about 7 pounds.  He reported complete resolution of his diarrhea and nausea.  He is able to get around well. -His blood work was grossly within normal limits.  His CEA on 03/09/2019 was 7.9. -I have recommended repeat CEA level and a CT scan of the chest, abdomen and pelvis in 1 month.  2.  Peripheral neuropathy: -He is not on gabapentin anymore.  His numbness has improved.  3.  Diarrhea: -He reports his diarrhea has completely resolved since we stopped chemotherapy.  4.  Diabetes: -He was supposed to be on Novolin R FlexPen with 5 units as needed. -He reported that he is not requiring any insulin at this time.

## 2019-03-31 LAB — CEA: CEA: 7.5 ng/mL — ABNORMAL HIGH (ref 0.0–4.7)

## 2019-05-02 ENCOUNTER — Inpatient Hospital Stay (HOSPITAL_COMMUNITY): Payer: Medicare Other | Attending: Hematology | Admitting: Hematology

## 2019-05-02 ENCOUNTER — Encounter (HOSPITAL_COMMUNITY): Payer: Self-pay | Admitting: Hematology

## 2019-05-02 ENCOUNTER — Ambulatory Visit (HOSPITAL_COMMUNITY)
Admission: RE | Admit: 2019-05-02 | Discharge: 2019-05-02 | Disposition: A | Discharge status: Home / Self Care | Payer: Medicare Other | Source: Ambulatory Visit | Attending: Hematology | Admitting: Hematology

## 2019-05-02 ENCOUNTER — Other Ambulatory Visit: Payer: Self-pay

## 2019-05-02 DIAGNOSIS — Z79899 Other long term (current) drug therapy: Secondary | ICD-10-CM | POA: Diagnosis not present

## 2019-05-02 DIAGNOSIS — R197 Diarrhea, unspecified: Secondary | ICD-10-CM | POA: Diagnosis not present

## 2019-05-02 DIAGNOSIS — C184 Malignant neoplasm of transverse colon: Secondary | ICD-10-CM

## 2019-05-02 DIAGNOSIS — E119 Type 2 diabetes mellitus without complications: Secondary | ICD-10-CM | POA: Diagnosis not present

## 2019-05-02 DIAGNOSIS — G629 Polyneuropathy, unspecified: Secondary | ICD-10-CM | POA: Insufficient documentation

## 2019-05-02 MED ORDER — IOHEXOL 300 MG/ML  SOLN
100.0000 mL | Freq: Once | INTRAMUSCULAR | Status: AC | PRN
  Administered 2019-05-02: 11:00:00 100 mL via INTRAVENOUS

## 2019-05-02 NOTE — Patient Instructions (Addendum)
[place] at [facility] Discharge Instructions  You were seen today by Dr. Delton Coombes. He went over your recent lab and scan results. He will see you back as scheduled for follow up.   Thank you for choosing [place] at [facility] to provide your oncology and hematology care.  To afford each patient quality time with our provider, please arrive at least 15 minutes before your scheduled appointment time.   If you have a lab appointment with the [facility] please come in thru the  Main Entrance and check in at the main information desk  You need to re-schedule your appointment should you arrive 10 or more minutes late.  We strive to give you quality time with our providers, and arriving late affects you and other patients whose appointments are after yours.  Also, if you no show three or more times for appointments you may be dismissed from the clinic at the providers discretion.     Again, thank you for choosing [facility].  Our hope is that these requests will decrease the amount of time that you wait before being seen by our physicians.       _____________________________________________________________  Should you have questions after your visit to [facility], please contact our office at [phone] between the hours of 8:00 a.m. and 4:30 p.m.  Voicemails left after 4:00 p.m. will not be returned until the following business day.  For prescription refill requests, have your pharmacy contact our office and allow 72 hours.    Cancer Center Support Programs:   > Cancer Support Group  2nd Tuesday of the month 1pm-2pm, Journey Room

## 2019-05-02 NOTE — Progress Notes (Signed)
[facility] [address], [place] [address]   CLINIC:  Medical Oncology/Hematology  PCP:  Kevin Kirk, Kevin Kirk [id number] [phone]   REASON FOR VISIT:  Follow-up for Colon Cancer  CURRENT THERAPY: Observation.  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[place])   07/27/2017 -  Chemotherapy   The patient had palonosetron (ALOXI) injection 0.25 mg, 0.25 mg, Intravenous,  Once, 41 of 45 cycles Administration: 0.25 mg (07/27/2017), 0.25 mg (08/10/2017), 0.25 mg (08/24/2017), 0.25 mg (09/07/2017), 0.25 mg (09/21/2017), 0.25 mg (10/05/2017), 0.25 mg (10/19/2017), 0.25 mg (11/02/2017), 0.25 mg (11/16/2017), 0.25 mg (11/30/2017), 0.25 mg (12/14/2017), 0.25 mg (12/28/2017), 0.25 mg (01/12/2018), 0.25 mg (01/26/2018), 0.25 mg (02/09/2018), 0.25 mg (02/24/2018), 0.25 mg (03/10/2018), 0.25 mg (03/29/2018), 0.25 mg (04/12/2018), 0.25 mg (04/26/2018), 0.25 mg (05/10/2018), 0.25 mg (05/24/2018), 0.25 mg (06/07/2018), 0.25 mg (06/21/2018), 0.25 mg (07/05/2018), 0.25 mg (07/19/2018), 0.25 mg (08/03/2018), 0.25 mg (08/18/2018), 0.25 mg (09/01/2018), 0.25 mg (09/15/2018), 0.25 mg (09/29/2018), 0.25 mg (10/13/2018), 0.25 mg (10/27/2018), 0.25 mg (11/10/2018), 0.25 mg (11/24/2018), 0.25 mg (12/08/2018), 0.25 mg (12/22/2018), 0.25 mg (01/05/2019), 0.25 mg (01/19/2019), 0.25 mg (02/02/2019), 0.25 mg (02/22/2019) pegfilgrastim-cbqv (UDENYCA) injection 6 mg, 6 mg, Subcutaneous, Once, 7 of 7 cycles Administration: 6 mg (10/07/2017), 6 mg (10/21/2017), 6 mg (11/04/2017), 6 mg (11/18/2017), 6 mg (12/02/2017), 6 mg (12/16/2017), 6 mg (12/30/2017) bevacizumab-bvzr (ZIRABEV) chemo injection 400 mg, 5 mg/kg = 400 mg (100 % of original dose 5 mg/kg), Intravenous,  Once, 2 of 2 cycles Dose modification: 5 mg/kg (original dose 5 mg/kg, Cycle 35) bevacizumab (AVASTIN) 450 mg in sodium chloride 0.9 % 100 mL chemo  infusion, 5 mg/kg = 450 mg, Intravenous,  Once, 35 of 35 cycles Dose modification: 4 mg/kg (80 % of original dose 5 mg/kg, Cycle 5, Reason: Provider Judgment), 5 mg/kg (original dose 5 mg/kg, Cycle 16, Reason: Other (see comments)), 5 mg/kg (original dose 5 mg/kg, Cycle 35) Administration: 450 mg (07/27/2017), 450 mg (08/10/2017), 450 mg (08/24/2017), 450 mg (09/07/2017), 450 mg (09/21/2017), 450 mg (10/05/2017), 450 mg (10/19/2017), 450 mg (11/02/2017), 450 mg (11/16/2017), 450 mg (11/30/2017), 450 mg (12/14/2017), 450 mg (12/28/2017), 400 mg (01/12/2018), 400 mg (01/26/2018), 400 mg (02/09/2018), 400 mg (02/24/2018), 400 mg (03/10/2018), 400 mg (03/29/2018), 400 mg (04/12/2018), 400 mg (04/26/2018), 400 mg (05/10/2018), 400 mg (05/24/2018), 400 mg (06/07/2018), 400 mg (06/21/2018), 400 mg (07/05/2018), 400 mg (07/19/2018), 400 mg (08/03/2018), 400 mg (08/18/2018), 400 mg (09/01/2018), 400 mg (09/15/2018), 400 mg (09/29/2018), 400 mg (10/13/2018), 400 mg (10/27/2018), 400 mg (11/10/2018), 400 mg (11/24/2018) irinotecan (CAMPTOSAR) 400 mg in dextrose 5 % 500 mL chemo infusion, 180 mg/m2 = 400 mg, Intravenous,  Once, 32 of 32 cycles Dose modification: 144 mg/m2 (80 % of original dose 180 mg/m2, Cycle 5, Reason: Provider Judgment), 135 mg/m2 (75 % of original dose 180 mg/m2, Cycle 30, Reason: Other (see comments), Comment: diarrhea) Administration: 400 mg (07/27/2017), 400 mg (08/10/2017), 400 mg (08/24/2017), 400 mg (09/07/2017), 320 mg (09/21/2017), 320 mg (10/05/2017), 320 mg (10/19/2017), 320 mg (11/02/2017), 320 mg (11/16/2017), 320 mg (11/30/2017), 320 mg (12/14/2017), 320 mg (12/28/2017), 320 mg (01/12/2018), 320 mg (01/26/2018), 320 mg (02/09/2018), 300 mg (02/24/2018), 300 mg (03/10/2018), 300 mg (03/29/2018), 300 mg (04/12/2018), 300 mg (04/26/2018), 300 mg (05/10/2018), 300 mg (05/24/2018), 300 mg (06/07/2018), 300 mg (06/21/2018), 300 mg (07/05/2018), 300 mg (07/19/2018), 300 mg (08/03/2018), 300 mg (  08/18/2018), 300 mg (09/01/2018), 280 mg (09/15/2018), 280 mg (09/29/2018),  280 mg (10/13/2018) leucovorin 800 mg in dextrose 5 % 250 mL infusion, 872 mg, Intravenous,  Once, 41 of 45 cycles Dose modification: 320 mg/m2 (80 % of original dose 400 mg/m2, Cycle 5, Reason: Provider Judgment) Administration: 800 mg (07/27/2017), 800 mg (08/10/2017), 800 mg (08/24/2017), 800 mg (09/07/2017), 700 mg (09/21/2017), 700 mg (10/05/2017), 700 mg (10/19/2017), 700 mg (11/02/2017), 700 mg (11/16/2017), 700 mg (11/30/2017), 700 mg (12/14/2017), 700 mg (12/28/2017), 700 mg (01/12/2018), 700 mg (01/26/2018), 700 mg (02/09/2018), 700 mg (02/24/2018), 700 mg (03/10/2018), 700 mg (03/29/2018), 700 mg (04/12/2018), 700 mg (04/26/2018), 700 mg (05/10/2018), 700 mg (05/24/2018), 700 mg (06/07/2018), 700 mg (06/21/2018), 700 mg (07/05/2018), 700 mg (07/19/2018), 700 mg (08/03/2018), 700 mg (08/18/2018), 700 mg (09/01/2018), 700 mg (09/15/2018), 700 mg (09/29/2018), 700 mg (10/13/2018), 700 mg (10/27/2018), 700 mg (11/10/2018), 700 mg (11/24/2018), 700 mg (12/08/2018), 700 mg (12/22/2018), 700 mg (01/05/2019), 700 mg (01/19/2019), 700 mg (02/02/2019), 700 mg (02/22/2019) fluorouracil (ADRUCIL) chemo injection 850 mg, 400 mg/m2 = 850 mg, Intravenous,  Once, 41 of 45 cycles Dose modification: 320 mg/m2 (80 % of original dose 400 mg/m2, Cycle 5, Reason: Provider Judgment) Administration: 850 mg (07/27/2017), 850 mg (08/10/2017), 850 mg (08/24/2017), 850 mg (09/07/2017), 700 mg (09/21/2017), 700 mg (10/05/2017), 700 mg (10/19/2017), 700 mg (11/02/2017), 700 mg (11/16/2017), 700 mg (11/30/2017), 700 mg (12/14/2017), 700 mg (12/28/2017), 700 mg (01/12/2018), 700 mg (01/26/2018), 700 mg (02/09/2018), 650 mg (02/24/2018), 650 mg (03/10/2018), 650 mg (03/29/2018), 650 mg (04/12/2018), 650 mg (04/26/2018), 650 mg (05/10/2018), 650 mg (05/24/2018), 650 mg (06/07/2018), 650 mg (06/21/2018), 650 mg (07/05/2018), 650 mg (07/19/2018), 650 mg (08/03/2018), 650 mg (08/18/2018), 650 mg (09/01/2018), 650 mg (09/15/2018), 650 mg (09/29/2018), 650 mg (10/13/2018), 650 mg (10/27/2018), 650 mg (11/10/2018), 650 mg  (11/24/2018), 650 mg (12/08/2018), 650 mg (12/22/2018), 650 mg (01/05/2019), 650 mg (01/19/2019), 650 mg (02/02/2019), 650 mg (02/22/2019) fosaprepitant (EMEND) 150 mg in sodium chloride 0.9 % 145 mL IVPB, , Intravenous,  Once, 7 of 11 cycles Administration:  (11/24/2018),  (12/08/2018),  (12/22/2018),  (01/05/2019),  (01/19/2019),  (02/02/2019),  (02/22/2019) fluorouracil (ADRUCIL) 5,250 mg in sodium chloride 0.9 % 145 mL chemo infusion, 2,400 mg/m2 = 5,250 mg, Intravenous, 1 Day/Dose, 41 of 45 cycles Dose modification: 1,920 mg/m2 (80 % of original dose 2,400 mg/m2, Cycle 5, Reason: Provider Judgment) Administration: 5,250 mg (07/27/2017), 5,250 mg (08/10/2017), 5,250 mg (08/24/2017), 5,250 mg (09/07/2017), 4,200 mg (09/21/2017), 4,200 mg (10/05/2017), 4,200 mg (10/19/2017), 4,200 mg (11/02/2017), 4,200 mg (11/16/2017), 4,200 mg (11/30/2017), 4,200 mg (12/14/2017), 4,200 mg (12/28/2017), 4,200 mg (01/12/2018), 4,200 mg (01/26/2018), 4,200 mg (02/09/2018), 3,950 mg (02/24/2018), 3,950 mg (03/10/2018), 3,950 mg (03/29/2018), 3,950 mg (04/12/2018), 3,950 mg (04/26/2018), 3,950 mg (05/10/2018), 3,950 mg (05/24/2018), 3,950 mg (06/07/2018), 3,950 mg (06/21/2018), 3,950 mg (07/05/2018), 3,950 mg (07/19/2018), 3,950 mg (08/03/2018), 3,950 mg (08/18/2018), 3,950 mg (09/01/2018), 3,950 mg (09/15/2018), 3,950 mg (09/29/2018), 3,950 mg (10/13/2018), 3,950 mg (10/27/2018), 3,950 mg (11/10/2018), 3,950 mg (11/24/2018), 3,950 mg (12/08/2018), 3,950 mg (12/22/2018), 3,950 mg (01/05/2019), 3,950 mg (01/19/2019), 3,950 mg (02/02/2019), 3,950 mg (02/22/2019) bevacizumab-bvzr (ZIRABEV) 400 mg in sodium chloride 0.9 % 100 mL chemo infusion, 5 mg/kg = 400 mg (100 % of original dose 5 mg/kg), Intravenous,  Once, 6 of 10 cycles Dose modification: 5 mg/kg (original dose 5 mg/kg, Cycle 36) Administration: 400 mg (12/08/2018), 400 mg (12/22/2018), 400 mg (01/05/2019), 400 mg (01/19/2019), 400 mg (02/02/2019), 400 mg (02/22/2019)  for chemotherapy treatment.  CANCER  STAGING: Cancer Staging Malignant neoplasm of transverse colon [facility]) Staging form: Colon and Rectum, AJCC 8th Edition - Clinical: cT3, cN1a, cM1 - Signed by Twana First, MD on 03/09/2017 - Pathologic: No stage assigned - Unsigned    INTERVAL HISTORY:  Mr. Mecca 86 y.o. male seen for follow-up of stage IV cancer.  Appetite and energy levels are close to 100%.  No pain reported.  Numbness in the hands and feet has been stable.  Had one episode of vomiting.  Denies any diarrhea at this time.  Occasional dizziness is also stable.  REVIEW OF SYSTEMS:  Review of Systems  Neurological: Positive for dizziness and numbness.  All other systems reviewed and are negative.    PAST MEDICAL/SURGICAL HISTORY:  Past Medical History:  Diagnosis Date  . Chronic pulmonary embolism ([place]) 81/2751   compliction of the valve replacement surgery  . COPD (chronic obstructive pulmonary disease) ([place])   . Diabetes ([place]) 04/22/2016  . H/O aortic valve replacement 10/2008  . Hypercholesteremia   . Hypertension   . Pulmonary emboli ([place]) 10/2008  . Sleep apnea    Past Surgical History:  Procedure Laterality Date  . AORTIC VALVE REPLACEMENT     2010  . CARDIAC CATHETERIZATION  2010  . COLONOSCOPY N/A 06/05/2016   Procedure: COLONOSCOPY;  Surgeon: Rogene Houston, MD;  Location: [facility] ENDO SUITE;  Service: Endoscopy;  Laterality: N/A;  . PARTIAL COLECTOMY N/A 07/11/2016   Procedure: PARTIAL COLECTOMY;  Surgeon: Aviva Signs, MD;  Location: [facility] ORS;  Service: General;  Laterality: N/A;  . PORTACATH PLACEMENT N/A 08/13/2016   Procedure: INSERTION PORT-A-CATH LEFT SUBCLAVIAN;  Surgeon: Aviva Signs, MD;  Location: [facility] ORS;  Service: General;  Laterality: N/A;  . REPLACEMENT TOTAL KNEE     1996  . spenectomy     from trauma     SOCIAL HISTORY:  Social History   Socioeconomic History  . Marital status: Widowed    Spouse name: Not on file  . Number of children: Not on file  . Years of education: Not on  file  . Highest education level: Not on file  Occupational History  . Not on file  Tobacco Use  . Smoking status: Former Smoker    Years: 15.00    Types: Cigarettes    Quit date: 1964    Years since quitting: 2.1  . Smokeless tobacco: Never Used  Substance and Sexual Activity  . Alcohol use: No  . Drug use: No  . Sexual activity: Yes  Other Topics Concern  . Not on file  Social History Narrative  . Not on file   Social Determinants of Health   Financial Resource Strain:   . Difficulty of Paying Living Expenses: Not on file  Food Insecurity:   . Worried About Charity fundraiser in the Last Year: Not on file  . Ran Out of Food in the Last Year: Not on file  Transportation Needs:   . Lack of Transportation (Medical): Not on file  . Lack of Transportation (Non-Medical): Not on file  Physical Activity:   . Days of Exercise per Week: Not on file  . Minutes of Exercise per Session: Not on file  Stress:   . Feeling of Stress : Not on file  Social Connections:   . Frequency of Communication with Friends and Family: Not on file  . Frequency of Social Gatherings with Friends and Family: Not on file  . Attends Religious Services: Not on file  .  Active Member of Clubs or Organizations: Not on file  . Attends Archivist Meetings: Not on file  . Marital Status: Not on file  Intimate Partner Violence:   . Fear of Current or Ex-Partner: Not on file  . Emotionally Abused: Not on file  . Physically Abused: Not on file  . Sexually Abused: Not on file    FAMILY HISTORY:  Family History  Problem Relation Age of Onset  . Colon cancer Neg Hx     CURRENT MEDICATIONS:  Outpatient Encounter Medications as of 05/02/2019  Medication Sig Note  . atorvastatin (LIPITOR) 10 MG tablet Take 10 mg by mouth at bedtime.    . Cholecalciferol (VITAMIN D3) 5000 units CAPS Take 5,000 Units by mouth daily.    . Insulin Regular Human (NOVOLIN R FLEXPEN) 100 UNIT/ML SOPN Inject 5 Units as  directed daily. If sugar more than 150   . lisinopril (PRINIVIL,ZESTRIL) 10 MG tablet Take 10 mg by mouth daily.   . metoprolol (LOPRESSOR) 50 MG tablet Take 50 mg by mouth 2 (two) times daily.   . Multiple Vitamins-Minerals (CENTRUM SILVER 50+MEN PO) Take 1 tablet by mouth daily.   Marland Kitchen NOVOFINE 32G X 6 MM MISC    . RELION GLUCOSE TEST STRIPS test strip USE 1 STRIP TO CHECK GLUCOSE ONCE DAILY   . vitamin B-12 (CYANOCOBALAMIN) 1000 MCG tablet Take 1,000 mcg by mouth daily.    . diphenoxylate-atropine (LOMOTIL) 2.5-0.025 MG tablet Take 1 tablet by mouth 4 (four) times daily as needed for diarrhea or loose stools. (Patient not taking: Reported on 03/09/2019)   . lidocaine-prilocaine (EMLA) cream Apply 1 application topically as needed. (Patient not taking: Reported on 05/02/2019)   . ondansetron (ZOFRAN ODT) 8 MG disintegrating tablet Take 1 tablet (8 mg total) by mouth every 8 (eight) hours as needed for nausea or vomiting.   . [DISCONTINUED] prochlorperazine (COMPAZINE) 10 MG tablet Take 1 tablet (10 mg total) by mouth every 6 (six) hours as needed (Nausea or vomiting). 08/05/2016: New Med   Facility-Administered Encounter Medications as of 05/02/2019  Medication  . sodium chloride flush (NS) 0.9 % injection 10 mL    ALLERGIES:  Allergies  Allergen Reactions  . Amoxicillin Hives    Has patient had a PCN reaction causing immediate rash, facial/tongue/throat swelling, SOB or lightheadedness with hypotension: No Has patient had a PCN reaction causing severe rash involving mucus membranes or skin necrosis: Yes Has patient had a PCN reaction that required hospitalization No Has patient had a PCN reaction occurring within the last 10 years: No If all of the above answers are "NO", then may proceed with Cephalosporin use.   . Tamsulosin Hcl     Cant sleep, confusion      PHYSICAL EXAM:  ECOG Performance status: 1  Vitals:   05/02/19 1005  BP: (!) 150/76  Pulse: 60  Resp: 18  Temp: (!) 97.1  F (36.2 C)  SpO2: 98%   Filed Weights   05/02/19 1005  Weight: 168 lb 8 oz (76.4 kg)    Physical Exam Vitals reviewed.  Constitutional:      Appearance: Normal appearance.  HENT:     Head: Normocephalic.     Nose: Nose normal.     Mouth/Throat:     Mouth: Mucous membranes are moist.     Pharynx: Oropharynx is clear.  Eyes:     Extraocular Movements: Extraocular movements intact.     Conjunctiva/sclera: Conjunctivae normal.  Cardiovascular:  Rate and Rhythm: Normal rate and regular rhythm.     Pulses: Normal pulses.     Heart sounds: Normal heart sounds.  Pulmonary:     Effort: Pulmonary effort is normal.     Breath sounds: Normal breath sounds.  Abdominal:     General: Bowel sounds are normal.  Musculoskeletal:        General: Normal range of motion.     Cervical back: Normal range of motion.  Skin:    General: Skin is warm and dry.  Neurological:     General: No focal deficit present.     Mental Status: He is alert and oriented to person, place, and time.  Psychiatric:        Mood and Affect: Mood normal.        Behavior: Behavior normal.        Thought Content: Thought content normal.        Judgment: Judgment normal.      LABORATORY DATA:  I have reviewed the labs as listed.  CBC    Component Value Date/Time   WBC 10.6 (H) 03/30/2019 1058   RBC 3.89 (L) 03/30/2019 1058   HGB 13.3 03/30/2019 1058   HCT 42.4 03/30/2019 1058   PLT 203 03/30/2019 1058   MCV 109.0 (H) 03/30/2019 1058   MCH 34.2 (H) 03/30/2019 1058   MCHC 31.4 03/30/2019 1058   RDW 13.5 03/30/2019 1058   LYMPHSABS 3.4 03/30/2019 1058   MONOABS 1.1 (H) 03/30/2019 1058   EOSABS 0.7 (H) 03/30/2019 1058   BASOSABS 0.1 03/30/2019 1058   CMP Latest Ref Rng & Units 03/30/2019 03/09/2019 02/22/2019  Glucose 70 - 99 mg/dL 168(H) 214(H) 119(H)  BUN 8 - 23 mg/dL '13 19 17  '$ Creatinine 0.61 - 1.24 mg/dL 0.80 0.88 0.78  Sodium 135 - 145 mmol/L 139 137 139  Potassium 3.5 - 5.1 mmol/L 4.9 4.5 4.2   Chloride 98 - 111 mmol/L 101 100 104  CO2 22 - 32 mmol/L '29 26 27  '$ Calcium 8.9 - 10.3 mg/dL 9.5 9.4 9.2  Total Protein 6.5 - 8.1 g/dL 7.0 6.7 6.8  Total Bilirubin 0.3 - 1.2 mg/dL 1.1 0.9 0.5  Alkaline Phos 38 - 126 U/L 46 42 46  AST 15 - 41 U/L '28 27 29  '$ ALT 0 - 44 U/L '26 25 28     '$ I have independently reviewed his scans and discussed with the patient.   ASSESSMENT & PLAN:   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[place]) 1.  Stage IV colon cancer (pT3 pN1 M1) MSI high: - K-ras mutation negative, BRAF V600 E+, PIK3CA, TP53, CDKN2A -12 cycles of FOLFOX from 08/26/2016 through 01/27/2017 -Opdivo from 02/23/2017 through 07/13/2017 -FOLFIRI and bevacizumab from 07/27/2017 through 10/13/2018 -5-FU and bevacizumab maintenance from 10/27/2018 through 02/22/2019. -CT CAP on 02/01/2019 showed 12 mm short axis subcarinal lymph node improved.  No suspicious findings for metastatic disease in the abdomen or pelvis.  Stable nodularity in the left upper abdomen and right lower quadrant. -He is on chemo holiday at this time. -He is continuing to do better functionally and is gaining weight. -We reviewed labs from March 30, 2019.  CEA 7.3.  LFTs are grossly normal.  Physical exam today did not reveal any signs of recurrence. -We have scheduled him for CT scan later today.  We will set up a phone visit in the next couple of days.  2.  Peripheral neuropathy: -Numbness has improved.  He is not taking gabapentin anymore.  3.  Diarrhea: -Diarrhea resolved completely after we stopped chemotherapy.  4.  Diabetes: -He brought fingersticks in the mornings which were fasting.  Most of them are between 120 and 130.  He is not on any insulin at this time.  Orders placed this encounter:  No orders of the defined types were placed in this encounter.     [place] [phone]

## 2019-05-04 ENCOUNTER — Encounter (HOSPITAL_COMMUNITY): Payer: Self-pay | Admitting: Hematology

## 2019-05-04 ENCOUNTER — Other Ambulatory Visit: Payer: Self-pay

## 2019-05-04 ENCOUNTER — Inpatient Hospital Stay (HOSPITAL_BASED_OUTPATIENT_CLINIC_OR_DEPARTMENT_OTHER): Payer: Medicare Other | Admitting: Hematology

## 2019-05-04 DIAGNOSIS — C184 Malignant neoplasm of transverse colon: Secondary | ICD-10-CM

## 2019-05-04 NOTE — Patient Instructions (Signed)
[facility] at [facility]  Discharge Instructions:  You had a phone visit with Dr. Katragadda today. _______________________________________________________________  Thank you for choosing [facility] at [facility] to provide your oncology and hematology care.  To afford each patient quality time with our providers, please arrive at least 15 minutes before your scheduled appointment.  You need to re-schedule your appointment if you arrive 10 or more minutes late.  We strive to give you quality time with our providers, and arriving late affects you and other patients whose appointments are after yours.  Also, if you no show three or more times for appointments you may be dismissed from the clinic.  Again, thank you for choosing [facility] at [facility]. Our hope is that these requests will allow you access to exceptional care and in a timely manner. _______________________________________________________________  If you have questions after your visit, please contact our office at [phone] between the hours of 8:30 a.m. and 5:00 p.m. Voicemails left after 4:30 p.m. will not be returned until the following business day. _______________________________________________________________  For prescription refill requests, have your pharmacy contact our office. _______________________________________________________________  Recommendations made by the consultant and any test results will be sent to your referring physician. _______________________________________________________________ 

## 2019-05-04 NOTE — Progress Notes (Signed)
Virtual Visit via Telephone Note  I connected with Kevin Kirk on 05/04/19 at 10:30 AM EST by telephone and verified that I am speaking with the correct person using two identifiers.   I discussed the limitations, risks, security and privacy concerns of performing an evaluation and management service by telephone and the availability of in person appointments. I also discussed with the patient that there may be a patient responsible charge related to this service. The patient expressed understanding and agreed to proceed.   History of Present Illness: He follows up in our clinic for metastatic colon cancer.  He is currently on chemo holiday.  Last chemotherapy was in December 2020.   Observations/Objective: Denies any new onset pains.  Denies any recent upper respiratory infections.  Appetite is 100%.  Energy levels are 75%.  Numbness in the hands and feet has been stable.  Assessment and Plan: 1.  Metastatic colon cancer: -We reviewed results of CT abdomen and pelvis from 05/02/2019.  Groundglass opacity with more solid appearance on the right lung compared to previous scan from November 2020.  This finding is nonspecific and could be inflammatory.  Stable peritoneal nodularity in the splenectomy bed. -I have recommended follow-up in 2 months with labs and CEA.  He will have port flushed at the same time. -I plan to repeat next scan in 3 to 4 months.   Follow Up Instructions: RTC 2 months with labs.   I discussed the assessment and treatment plan with the patient. The patient was provided an opportunity to ask questions and all were answered. The patient agreed with the plan and demonstrated an understanding of the instructions.   The patient was advised to call back or seek an in-person evaluation if the symptoms worsen or if the condition fails to improve as anticipated.  I provided 6 minutes of non-face-to-face time during this encounter.   Derek Jack, MD

## 2019-05-04 NOTE — Assessment & Plan Note (Signed)
1.  Stage IV colon cancer (pT3 pN1 M1) MSI high: - K-ras mutation negative, BRAF V600 E+, PIK3CA, TP53, CDKN2A -12 cycles of FOLFOX from 08/26/2016 through 01/27/2017 -Opdivo from 02/23/2017 through 07/13/2017 -FOLFIRI and bevacizumab from 07/27/2017 through 10/13/2018 -5-FU and bevacizumab maintenance from 10/27/2018 through 02/22/2019. -CT CAP on 02/01/2019 showed 12 mm short axis subcarinal lymph node improved.  No suspicious findings for metastatic disease in the abdomen or pelvis.  Stable nodularity in the left upper abdomen and right lower quadrant. -He is on chemo holiday at this time. -He is continuing to do better functionally and is gaining weight. -We reviewed labs from March 30, 2019.  CEA 7.3.  LFTs are grossly normal.  Physical exam today did not reveal any signs of recurrence. -We have scheduled him for CT scan later today.  We will set up a phone visit in the next couple of days.  2.  Peripheral neuropathy: -Numbness has improved.  He is not taking gabapentin anymore.  3.  Diarrhea: -Diarrhea resolved completely after we stopped chemotherapy.  4.  Diabetes: -He brought fingersticks in the mornings which were fasting.  Most of them are between 120 and 130.  He is not on any insulin at this time.

## 2019-06-02 ENCOUNTER — Encounter (HOSPITAL_COMMUNITY): Payer: Medicare Other

## 2019-07-07 ENCOUNTER — Encounter (HOSPITAL_COMMUNITY): Payer: Self-pay | Admitting: Hematology

## 2019-07-07 ENCOUNTER — Inpatient Hospital Stay (HOSPITAL_COMMUNITY): Payer: Medicare Other | Attending: Hematology

## 2019-07-07 ENCOUNTER — Other Ambulatory Visit: Payer: Self-pay

## 2019-07-07 ENCOUNTER — Inpatient Hospital Stay (HOSPITAL_BASED_OUTPATIENT_CLINIC_OR_DEPARTMENT_OTHER): Payer: Medicare Other | Admitting: Hematology

## 2019-07-07 VITALS — Wt 173.6 lb

## 2019-07-07 DIAGNOSIS — G4730 Sleep apnea, unspecified: Secondary | ICD-10-CM | POA: Diagnosis not present

## 2019-07-07 DIAGNOSIS — C184 Malignant neoplasm of transverse colon: Secondary | ICD-10-CM

## 2019-07-07 DIAGNOSIS — Z794 Long term (current) use of insulin: Secondary | ICD-10-CM | POA: Insufficient documentation

## 2019-07-07 DIAGNOSIS — Z86711 Personal history of pulmonary embolism: Secondary | ICD-10-CM | POA: Insufficient documentation

## 2019-07-07 DIAGNOSIS — E7800 Pure hypercholesterolemia, unspecified: Secondary | ICD-10-CM | POA: Insufficient documentation

## 2019-07-07 DIAGNOSIS — I10 Essential (primary) hypertension: Secondary | ICD-10-CM | POA: Diagnosis not present

## 2019-07-07 DIAGNOSIS — Z87891 Personal history of nicotine dependence: Secondary | ICD-10-CM | POA: Diagnosis not present

## 2019-07-07 DIAGNOSIS — J449 Chronic obstructive pulmonary disease, unspecified: Secondary | ICD-10-CM | POA: Diagnosis not present

## 2019-07-07 DIAGNOSIS — Z9221 Personal history of antineoplastic chemotherapy: Secondary | ICD-10-CM | POA: Diagnosis not present

## 2019-07-07 DIAGNOSIS — Z79899 Other long term (current) drug therapy: Secondary | ICD-10-CM | POA: Insufficient documentation

## 2019-07-07 DIAGNOSIS — Z9081 Acquired absence of spleen: Secondary | ICD-10-CM | POA: Insufficient documentation

## 2019-07-07 DIAGNOSIS — Z9049 Acquired absence of other specified parts of digestive tract: Secondary | ICD-10-CM | POA: Diagnosis not present

## 2019-07-07 DIAGNOSIS — E119 Type 2 diabetes mellitus without complications: Secondary | ICD-10-CM | POA: Diagnosis not present

## 2019-07-07 DIAGNOSIS — G629 Polyneuropathy, unspecified: Secondary | ICD-10-CM | POA: Diagnosis not present

## 2019-07-07 LAB — CBC WITH DIFFERENTIAL/PLATELET
Abs Immature Granulocytes: 0.03 10*3/uL (ref 0.00–0.07)
Basophils Absolute: 0.1 10*3/uL (ref 0.0–0.1)
Basophils Relative: 0 %
Eosinophils Absolute: 0.6 10*3/uL — ABNORMAL HIGH (ref 0.0–0.5)
Eosinophils Relative: 5 %
HCT: 42 % (ref 39.0–52.0)
Hemoglobin: 13.4 g/dL (ref 13.0–17.0)
Immature Granulocytes: 0 %
Lymphocytes Relative: 31 %
Lymphs Abs: 3.9 10*3/uL (ref 0.7–4.0)
MCH: 32.7 pg (ref 26.0–34.0)
MCHC: 31.9 g/dL (ref 30.0–36.0)
MCV: 102.4 fL — ABNORMAL HIGH (ref 80.0–100.0)
Monocytes Absolute: 1.4 10*3/uL — ABNORMAL HIGH (ref 0.1–1.0)
Monocytes Relative: 11 %
Neutro Abs: 6.4 10*3/uL (ref 1.7–7.7)
Neutrophils Relative %: 53 %
Platelets: 218 10*3/uL (ref 150–400)
RBC: 4.1 MIL/uL — ABNORMAL LOW (ref 4.22–5.81)
RDW: 12.9 % (ref 11.5–15.5)
WBC: 12.3 10*3/uL — ABNORMAL HIGH (ref 4.0–10.5)
nRBC: 0 % (ref 0.0–0.2)

## 2019-07-07 LAB — COMPREHENSIVE METABOLIC PANEL
ALT: 27 U/L (ref 0–44)
AST: 28 U/L (ref 15–41)
Albumin: 4 g/dL (ref 3.5–5.0)
Alkaline Phosphatase: 57 U/L (ref 38–126)
Anion gap: 11 (ref 5–15)
BUN: 20 mg/dL (ref 8–23)
CO2: 25 mmol/L (ref 22–32)
Calcium: 9.2 mg/dL (ref 8.9–10.3)
Chloride: 102 mmol/L (ref 98–111)
Creatinine, Ser: 0.91 mg/dL (ref 0.61–1.24)
GFR calc Af Amer: >60 mL/min (ref >60)
GFR calc non Af Amer: >60 mL/min (ref >60)
Glucose, Bld: 134 mg/dL — ABNORMAL HIGH (ref 70–99)
Potassium: 4.4 mmol/L (ref 3.5–5.1)
Sodium: 138 mmol/L (ref 135–145)
Total Bilirubin: 1.2 mg/dL (ref 0.3–1.2)
Total Protein: 7.4 g/dL (ref 6.5–8.1)

## 2019-07-07 MED ORDER — SODIUM CHLORIDE 0.9% FLUSH
20.0000 mL | INTRAVENOUS | Status: AC | PRN
  Administered 2019-07-07: 20 mL via INTRAVENOUS

## 2019-07-07 MED ORDER — HEPARIN SOD (PORK) LOCK FLUSH 100 UNIT/ML IV SOLN
500.0000 [IU] | Freq: Once | INTRAVENOUS | Status: AC
  Administered 2019-07-07: 500 [IU] via INTRAVENOUS

## 2019-07-07 NOTE — Patient Instructions (Signed)
[place] at [facility] Discharge Instructions  You were seen today by Dr. Delton Coombes. He went over your recent lab results. He will repeat scans in 2 months. He will see you back in 2 months for labs and follow up.   Thank you for choosing [place] at [facility] to provide your oncology and hematology care.  To afford each patient quality time with our provider, please arrive at least 15 minutes before your scheduled appointment time.   If you have a lab appointment with the [facility] please come in thru the  Main Entrance and check in at the main information desk  You need to re-schedule your appointment should you arrive 10 or more minutes late.  We strive to give you quality time with our providers, and arriving late affects you and other patients whose appointments are after yours.  Also, if you no show three or more times for appointments you may be dismissed from the clinic at the providers discretion.     Again, thank you for choosing [facility].  Our hope is that these requests will decrease the amount of time that you wait before being seen by our physicians.       _____________________________________________________________  Should you have questions after your visit to [facility], please contact our office at [phone] between the hours of 8:00 a.m. and 4:30 p.m.  Voicemails left after 4:00 p.m. will not be returned until the following business day.  For prescription refill requests, have your pharmacy contact our office and allow 72 hours.    Cancer Center Support Programs:   > Cancer Support Group  2nd Tuesday of the month 1pm-2pm, Journey Room

## 2019-07-07 NOTE — Assessment & Plan Note (Signed)
1.  Stage IV colon cancer (pT3 pN1 M1) MSI high: - K-ras mutation negative, BRAF V600 E+, PIK3CA, TP53, CDKN2A -12 cycles of FOLFOX from 08/26/2016 through 01/27/2017 -Opdivo from 02/23/2017 through 07/13/2017 -FOLFIRI and bevacizumab from 07/27/2017 through 10/13/2018 -5-FU and bevacizumab maintenance from 10/27/2018 through 02/22/2019. -CT CAP on 02/01/2019 showed 12 mm short axis subcarinal lymph node improved.  No suspicious findings for metastatic disease in the abdomen or pelvis.  Stable nodularity in the left upper abdomen and right lower quadrant. -He is on chemo holiday at this time. -Last CEA in January was 7.5.  Last CT scan on 05/02/2019 did not show any evidence of metastatic disease.  Groundglass opacity with more solid appearance in the right lung compared to previous scan from #2020.  This finding is nonspecific and could be inflammatory.  Stable peritoneal nodularity in the splenectomy bed. -I have recommended follow-up in 2 months with repeat CEA level.  We will also do a CT scan at that time.  2.  Peripheral neuropathy: -Improved.  Not requiring gabapentin.  3.  Diarrhea: -Diarrhea resolved completely.  4.  Diabetes: -He reports his fasting blood sugar is staying between 120 and 130.

## 2019-07-07 NOTE — Progress Notes (Signed)
[facility] [address], [place] [address]   CLINIC:  Medical Oncology/Hematology  PCP:  Tobe Sos, [place] [id number] [phone]   REASON FOR VISIT:  Follow-up for Colon Cancer  CURRENT THERAPY: Observation.  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[place])   07/27/2017 -  Chemotherapy   The patient had palonosetron (ALOXI) injection 0.25 mg, 0.25 mg, Intravenous,  Once, 41 of 45 cycles Administration: 0.25 mg (07/27/2017), 0.25 mg (08/10/2017), 0.25 mg (08/24/2017), 0.25 mg (09/07/2017), 0.25 mg (09/21/2017), 0.25 mg (10/05/2017), 0.25 mg (10/19/2017), 0.25 mg (11/02/2017), 0.25 mg (11/16/2017), 0.25 mg (11/30/2017), 0.25 mg (12/14/2017), 0.25 mg (12/28/2017), 0.25 mg (01/12/2018), 0.25 mg (01/26/2018), 0.25 mg (02/09/2018), 0.25 mg (02/24/2018), 0.25 mg (03/10/2018), 0.25 mg (03/29/2018), 0.25 mg (04/12/2018), 0.25 mg (04/26/2018), 0.25 mg (05/10/2018), 0.25 mg (05/24/2018), 0.25 mg (06/07/2018), 0.25 mg (06/21/2018), 0.25 mg (07/05/2018), 0.25 mg (07/19/2018), 0.25 mg (08/03/2018), 0.25 mg (08/18/2018), 0.25 mg (09/01/2018), 0.25 mg (09/15/2018), 0.25 mg (09/29/2018), 0.25 mg (10/13/2018), 0.25 mg (10/27/2018), 0.25 mg (11/10/2018), 0.25 mg (11/24/2018), 0.25 mg (12/08/2018), 0.25 mg (12/22/2018), 0.25 mg (01/05/2019), 0.25 mg (01/19/2019), 0.25 mg (02/02/2019), 0.25 mg (02/22/2019) pegfilgrastim-cbqv (UDENYCA) injection 6 mg, 6 mg, Subcutaneous, Once, 7 of 7 cycles Administration: 6 mg (10/07/2017), 6 mg (10/21/2017), 6 mg (11/04/2017), 6 mg (11/18/2017), 6 mg (12/02/2017), 6 mg (12/16/2017), 6 mg (12/30/2017) bevacizumab-bvzr (ZIRABEV) chemo injection 400 mg, 5 mg/kg = 400 mg (100 % of original dose 5 mg/kg), Intravenous,  Once, 2 of 2 cycles Dose modification: 5 mg/kg (original dose 5 mg/kg, Cycle 35) bevacizumab (AVASTIN) 450 mg in sodium chloride 0.9 % 100 mL chemo  infusion, 5 mg/kg = 450 mg, Intravenous,  Once, 35 of 35 cycles Dose modification: 4 mg/kg (80 % of original dose 5 mg/kg, Cycle 5, Reason: Provider Judgment), 5 mg/kg (original dose 5 mg/kg, Cycle 16, Reason: Other (see comments)), 5 mg/kg (original dose 5 mg/kg, Cycle 35) Administration: 450 mg (07/27/2017), 450 mg (08/10/2017), 450 mg (08/24/2017), 450 mg (09/07/2017), 450 mg (09/21/2017), 450 mg (10/05/2017), 450 mg (10/19/2017), 450 mg (11/02/2017), 450 mg (11/16/2017), 450 mg (11/30/2017), 450 mg (12/14/2017), 450 mg (12/28/2017), 400 mg (01/12/2018), 400 mg (01/26/2018), 400 mg (02/09/2018), 400 mg (02/24/2018), 400 mg (03/10/2018), 400 mg (03/29/2018), 400 mg (04/12/2018), 400 mg (04/26/2018), 400 mg (05/10/2018), 400 mg (05/24/2018), 400 mg (06/07/2018), 400 mg (06/21/2018), 400 mg (07/05/2018), 400 mg (07/19/2018), 400 mg (08/03/2018), 400 mg (08/18/2018), 400 mg (09/01/2018), 400 mg (09/15/2018), 400 mg (09/29/2018), 400 mg (10/13/2018), 400 mg (10/27/2018), 400 mg (11/10/2018), 400 mg (11/24/2018) irinotecan (CAMPTOSAR) 400 mg in dextrose 5 % 500 mL chemo infusion, 180 mg/m2 = 400 mg, Intravenous,  Once, 32 of 32 cycles Dose modification: 144 mg/m2 (80 % of original dose 180 mg/m2, Cycle 5, Reason: Provider Judgment), 135 mg/m2 (75 % of original dose 180 mg/m2, Cycle 30, Reason: Other (see comments), Comment: diarrhea) Administration: 400 mg (07/27/2017), 400 mg (08/10/2017), 400 mg (08/24/2017), 400 mg (09/07/2017), 320 mg (09/21/2017), 320 mg (10/05/2017), 320 mg (10/19/2017), 320 mg (11/02/2017), 320 mg (11/16/2017), 320 mg (11/30/2017), 320 mg (12/14/2017), 320 mg (12/28/2017), 320 mg (01/12/2018), 320 mg (01/26/2018), 320 mg (02/09/2018), 300 mg (02/24/2018), 300 mg (03/10/2018), 300 mg (03/29/2018), 300 mg (04/12/2018), 300 mg (04/26/2018), 300 mg (05/10/2018), 300 mg (05/24/2018), 300 mg (06/07/2018), 300 mg (06/21/2018), 300 mg (07/05/2018), 300 mg (07/19/2018), 300 mg (08/03/2018), 300 mg (  08/18/2018), 300 mg (09/01/2018), 280 mg (09/15/2018), 280 mg (09/29/2018),  280 mg (10/13/2018) leucovorin 800 mg in dextrose 5 % 250 mL infusion, 872 mg, Intravenous,  Once, 41 of 45 cycles Dose modification: 320 mg/m2 (80 % of original dose 400 mg/m2, Cycle 5, Reason: Provider Judgment) Administration: 800 mg (07/27/2017), 800 mg (08/10/2017), 800 mg (08/24/2017), 800 mg (09/07/2017), 700 mg (09/21/2017), 700 mg (10/05/2017), 700 mg (10/19/2017), 700 mg (11/02/2017), 700 mg (11/16/2017), 700 mg (11/30/2017), 700 mg (12/14/2017), 700 mg (12/28/2017), 700 mg (01/12/2018), 700 mg (01/26/2018), 700 mg (02/09/2018), 700 mg (02/24/2018), 700 mg (03/10/2018), 700 mg (03/29/2018), 700 mg (04/12/2018), 700 mg (04/26/2018), 700 mg (05/10/2018), 700 mg (05/24/2018), 700 mg (06/07/2018), 700 mg (06/21/2018), 700 mg (07/05/2018), 700 mg (07/19/2018), 700 mg (08/03/2018), 700 mg (08/18/2018), 700 mg (09/01/2018), 700 mg (09/15/2018), 700 mg (09/29/2018), 700 mg (10/13/2018), 700 mg (10/27/2018), 700 mg (11/10/2018), 700 mg (11/24/2018), 700 mg (12/08/2018), 700 mg (12/22/2018), 700 mg (01/05/2019), 700 mg (01/19/2019), 700 mg (02/02/2019), 700 mg (02/22/2019) fluorouracil (ADRUCIL) chemo injection 850 mg, 400 mg/m2 = 850 mg, Intravenous,  Once, 41 of 45 cycles Dose modification: 320 mg/m2 (80 % of original dose 400 mg/m2, Cycle 5, Reason: Provider Judgment) Administration: 850 mg (07/27/2017), 850 mg (08/10/2017), 850 mg (08/24/2017), 850 mg (09/07/2017), 700 mg (09/21/2017), 700 mg (10/05/2017), 700 mg (10/19/2017), 700 mg (11/02/2017), 700 mg (11/16/2017), 700 mg (11/30/2017), 700 mg (12/14/2017), 700 mg (12/28/2017), 700 mg (01/12/2018), 700 mg (01/26/2018), 700 mg (02/09/2018), 650 mg (02/24/2018), 650 mg (03/10/2018), 650 mg (03/29/2018), 650 mg (04/12/2018), 650 mg (04/26/2018), 650 mg (05/10/2018), 650 mg (05/24/2018), 650 mg (06/07/2018), 650 mg (06/21/2018), 650 mg (07/05/2018), 650 mg (07/19/2018), 650 mg (08/03/2018), 650 mg (08/18/2018), 650 mg (09/01/2018), 650 mg (09/15/2018), 650 mg (09/29/2018), 650 mg (10/13/2018), 650 mg (10/27/2018), 650 mg (11/10/2018), 650 mg  (11/24/2018), 650 mg (12/08/2018), 650 mg (12/22/2018), 650 mg (01/05/2019), 650 mg (01/19/2019), 650 mg (02/02/2019), 650 mg (02/22/2019) fosaprepitant (EMEND) 150 mg in sodium chloride 0.9 % 145 mL IVPB, , Intravenous,  Once, 7 of 11 cycles Administration:  (11/24/2018),  (12/08/2018),  (12/22/2018),  (01/05/2019),  (01/19/2019),  (02/02/2019),  (02/22/2019) fluorouracil (ADRUCIL) 5,250 mg in sodium chloride 0.9 % 145 mL chemo infusion, 2,400 mg/m2 = 5,250 mg, Intravenous, 1 Day/Dose, 41 of 45 cycles Dose modification: 1,920 mg/m2 (80 % of original dose 2,400 mg/m2, Cycle 5, Reason: Provider Judgment) Administration: 5,250 mg (07/27/2017), 5,250 mg (08/10/2017), 5,250 mg (08/24/2017), 5,250 mg (09/07/2017), 4,200 mg (09/21/2017), 4,200 mg (10/05/2017), 4,200 mg (10/19/2017), 4,200 mg (11/02/2017), 4,200 mg (11/16/2017), 4,200 mg (11/30/2017), 4,200 mg (12/14/2017), 4,200 mg (12/28/2017), 4,200 mg (01/12/2018), 4,200 mg (01/26/2018), 4,200 mg (02/09/2018), 3,950 mg (02/24/2018), 3,950 mg (03/10/2018), 3,950 mg (03/29/2018), 3,950 mg (04/12/2018), 3,950 mg (04/26/2018), 3,950 mg (05/10/2018), 3,950 mg (05/24/2018), 3,950 mg (06/07/2018), 3,950 mg (06/21/2018), 3,950 mg (07/05/2018), 3,950 mg (07/19/2018), 3,950 mg (08/03/2018), 3,950 mg (08/18/2018), 3,950 mg (09/01/2018), 3,950 mg (09/15/2018), 3,950 mg (09/29/2018), 3,950 mg (10/13/2018), 3,950 mg (10/27/2018), 3,950 mg (11/10/2018), 3,950 mg (11/24/2018), 3,950 mg (12/08/2018), 3,950 mg (12/22/2018), 3,950 mg (01/05/2019), 3,950 mg (01/19/2019), 3,950 mg (02/02/2019), 3,950 mg (02/22/2019) bevacizumab-bvzr (ZIRABEV) 400 mg in sodium chloride 0.9 % 100 mL chemo infusion, 5 mg/kg = 400 mg (100 % of original dose 5 mg/kg), Intravenous,  Once, 6 of 10 cycles Dose modification: 5 mg/kg (original dose 5 mg/kg, Cycle 36) Administration: 400 mg (12/08/2018), 400 mg (12/22/2018), 400 mg (01/05/2019), 400 mg (01/19/2019), 400 mg (02/02/2019), 400 mg (02/22/2019)  for chemotherapy treatment.  CANCER  STAGING: Cancer Staging Malignant neoplasm of transverse colon [facility]) Staging form: Colon and Rectum, AJCC 8th Edition - Clinical: cT3, cN1a, cM1 - Signed by Twana First, MD on 03/09/2017 - Pathologic: No stage assigned - Unsigned    INTERVAL HISTORY:  Mr. Urey 86 y.o. male seen for follow-up of stage IV colon cancer.  He is on chemo holiday at this time.  Appetite energy levels are 100%.  Occasional pain at the bellybutton on the left side present but not significant.  Numbness in the fingers and toes has improved.  He is not on any medication for it.  He is drinking boost 1 can daily.  REVIEW OF SYSTEMS:  Review of Systems  Neurological: Positive for numbness.  All other systems reviewed and are negative.    PAST MEDICAL/SURGICAL HISTORY:  Past Medical History:  Diagnosis Date  . Chronic pulmonary embolism ([place]) 46/8032   compliction of the valve replacement surgery  . COPD (chronic obstructive pulmonary disease) ([place])   . Diabetes ([place]) 04/22/2016  . H/O aortic valve replacement 10/2008  . Hypercholesteremia   . Hypertension   . Pulmonary emboli ([place]) 10/2008  . Sleep apnea    Past Surgical History:  Procedure Laterality Date  . AORTIC VALVE REPLACEMENT     2010  . CARDIAC CATHETERIZATION  2010  . COLONOSCOPY N/A 06/05/2016   Procedure: COLONOSCOPY;  Surgeon: Rogene Houston, MD;  Location: [facility] ENDO SUITE;  Service: Endoscopy;  Laterality: N/A;  . PARTIAL COLECTOMY N/A 07/11/2016   Procedure: PARTIAL COLECTOMY;  Surgeon: Aviva Signs, MD;  Location: [facility] ORS;  Service: General;  Laterality: N/A;  . PORTACATH PLACEMENT N/A 08/13/2016   Procedure: INSERTION PORT-A-CATH LEFT SUBCLAVIAN;  Surgeon: Aviva Signs, MD;  Location: [facility] ORS;  Service: General;  Laterality: N/A;  . REPLACEMENT TOTAL KNEE     1996  . spenectomy     from trauma     SOCIAL HISTORY:  Social History   Socioeconomic History  . Marital status: Widowed    Spouse name: Not on file  . Number of  children: Not on file  . Years of education: Not on file  . Highest education level: Not on file  Occupational History  . Not on file  Tobacco Use  . Smoking status: Former Smoker    Years: 15.00    Types: Cigarettes    Quit date: 1964    Years since quitting: 57.3  . Smokeless tobacco: Never Used  Substance and Sexual Activity  . Alcohol use: No  . Drug use: No  . Sexual activity: Yes  Other Topics Concern  . Not on file  Social History Narrative  . Not on file   Social Determinants of Health   Financial Resource Strain:   . Difficulty of Paying Living Expenses:   Food Insecurity:   . Worried About Charity fundraiser in the Last Year:   . Arboriculturist in the Last Year:   Transportation Needs:   . Film/video editor (Medical):   Marland Kitchen Lack of Transportation (Non-Medical):   Physical Activity:   . Days of Exercise per Week:   . Minutes of Exercise per Session:   Stress:   . Feeling of Stress :   Social Connections:   . Frequency of Communication with Friends and Family:   . Frequency of Social Gatherings with Friends and Family:   . Attends Religious Services:   . Active Member of Clubs or Organizations:   . Attends  Club or Organization Meetings:   Marland Kitchen Marital Status:   Intimate Partner Violence:   . Fear of Current or Ex-Partner:   . Emotionally Abused:   Marland Kitchen Physically Abused:   . Sexually Abused:     FAMILY HISTORY:  Family History  Problem Relation Age of Onset  . Colon cancer Neg Hx     CURRENT MEDICATIONS:  Outpatient Encounter Medications as of 07/07/2019  Medication Sig Note  . atorvastatin (LIPITOR) 10 MG tablet Take 10 mg by mouth at bedtime.    . Cholecalciferol (VITAMIN D3) 5000 units CAPS Take 5,000 Units by mouth daily.    . diphenoxylate-atropine (LOMOTIL) 2.5-0.025 MG tablet Take 1 tablet by mouth 4 (four) times daily as needed for diarrhea or loose stools.   . Insulin Regular Human (NOVOLIN R FLEXPEN) 100 UNIT/ML SOPN Inject 5 Units as  directed daily. If sugar more than 150   . lidocaine-prilocaine (EMLA) cream Apply 1 application topically as needed.   Marland Kitchen lisinopril (PRINIVIL,ZESTRIL) 10 MG tablet Take 10 mg by mouth daily.   . metoprolol (LOPRESSOR) 50 MG tablet Take 50 mg by mouth 2 (two) times daily.   . Multiple Vitamins-Minerals (CENTRUM SILVER 50+MEN PO) Take 1 tablet by mouth daily.   Marland Kitchen NOVOFINE 32G X 6 MM MISC    . ondansetron (ZOFRAN ODT) 8 MG disintegrating tablet Take 1 tablet (8 mg total) by mouth every 8 (eight) hours as needed for nausea or vomiting.   Marland Kitchen RELION GLUCOSE TEST STRIPS test strip USE 1 STRIP TO CHECK GLUCOSE ONCE DAILY   . vitamin B-12 (CYANOCOBALAMIN) 1000 MCG tablet Take 1,000 mcg by mouth daily.    . [DISCONTINUED] prochlorperazine (COMPAZINE) 10 MG tablet Take 1 tablet (10 mg total) by mouth every 6 (six) hours as needed (Nausea or vomiting). 08/05/2016: New Med   Facility-Administered Encounter Medications as of 07/07/2019  Medication  . sodium chloride flush (NS) 0.9 % injection 10 mL    ALLERGIES:  Allergies  Allergen Reactions  . Amoxicillin Hives    Has patient had a PCN reaction causing immediate rash, facial/tongue/throat swelling, SOB or lightheadedness with hypotension: No Has patient had a PCN reaction causing severe rash involving mucus membranes or skin necrosis: Yes Has patient had a PCN reaction that required hospitalization No Has patient had a PCN reaction occurring within the last 10 years: No If all of the above answers are "NO", then may proceed with Cephalosporin use.   . Tamsulosin Hcl     Cant sleep, confusion      PHYSICAL EXAM:  ECOG Performance status: 1  There were no vitals filed for this visit. Filed Weights   07/07/19 1034  Weight: 173 lb 9.6 oz (78.7 kg)    Physical Exam Vitals reviewed.  Constitutional:      Appearance: Normal appearance.  HENT:     Head: Normocephalic.     Nose: Nose normal.     Mouth/Throat:     Mouth: Mucous membranes are  moist.     Pharynx: Oropharynx is clear.  Eyes:     Extraocular Movements: Extraocular movements intact.     Conjunctiva/sclera: Conjunctivae normal.  Cardiovascular:     Rate and Rhythm: Normal rate and regular rhythm.     Pulses: Normal pulses.     Heart sounds: Normal heart sounds.  Pulmonary:     Effort: Pulmonary effort is normal.     Breath sounds: Normal breath sounds.  Abdominal:     General: Bowel sounds are normal.  Musculoskeletal:        General: Normal range of motion.     Cervical back: Normal range of motion.  Skin:    General: Skin is warm and dry.  Neurological:     General: No focal deficit present.     Mental Status: He is alert and oriented to person, place, and time.  Psychiatric:        Mood and Affect: Mood normal.        Behavior: Behavior normal.        Thought Content: Thought content normal.        Judgment: Judgment normal.      LABORATORY DATA:  I have reviewed the labs as listed.  CBC    Component Value Date/Time   WBC 12.3 (H) 07/07/2019 1026   RBC 4.10 (L) 07/07/2019 1026   HGB 13.4 07/07/2019 1026   HCT 42.0 07/07/2019 1026   PLT 218 07/07/2019 1026   MCV 102.4 (H) 07/07/2019 1026   MCH 32.7 07/07/2019 1026   MCHC 31.9 07/07/2019 1026   RDW 12.9 07/07/2019 1026   LYMPHSABS 3.9 07/07/2019 1026   MONOABS 1.4 (H) 07/07/2019 1026   EOSABS 0.6 (H) 07/07/2019 1026   BASOSABS 0.1 07/07/2019 1026   CMP Latest Ref Rng & Units 07/07/2019 03/30/2019 03/09/2019  Glucose 70 - 99 mg/dL 134(H) 168(H) 214(H)  BUN 8 - 23 mg/dL _0 Creatinine 0.61 - 1.24 mg/dL 0.91 0.80 0.88  Sodium 135 - 145 mmol/L 138 139 137  Potassium 3.5 - 5.1 mmol/L 4.4 4.9 4.5  Chloride 98 - 111 mmol/L 102 101 100  CO2 22 - 32 mmol/L _1 Calcium 8.9 - 10.3 mg/dL 9.2 9.5 9.4  Total Protein 6.5 - 8.1 g/dL 7.4 7.0 6.7  Total Bilirubin 0.3 - 1.2 mg/dL 1.2 1.1 0.9  Alkaline Phos 38 - 126 U/L 57 46 42  AST 15 - 41 U/L _2 ALT 0 - 44 U/L _3 I  have reviewed scans.   ASSESSMENT & PLAN:   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[place]) 1.  Stage IV colon cancer (pT3 pN1 M1) MSI high: - K-ras mutation negative, BRAF V600 E+, PIK3CA, TP53, CDKN2A -12 cycles of FOLFOX from 08/26/2016 through 01/27/2017 -Opdivo from 02/23/2017 through 07/13/2017 -FOLFIRI and bevacizumab from 07/27/2017 through 10/13/2018 -5-FU and bevacizumab maintenance from 10/27/2018 through 02/22/2019. -CT CAP on 02/01/2019 showed 12 mm short axis subcarinal lymph node improved.  No suspicious findings for metastatic disease in the abdomen or pelvis.  Stable nodularity in the left upper abdomen and right lower quadrant. -He is on chemo holiday at this time. -Last CEA in January was 7.5.  Last CT scan on 05/02/2019 did not show any evidence of metastatic disease.  Groundglass opacity with more solid appearance in the right lung compared to previous scan from #2020.  This finding is nonspecific and could be inflammatory.  Stable peritoneal nodularity in the splenectomy bed. -I have recommended follow-up in 2 months with repeat CEA level.  We will also do a CT scan at that time.  2.  Peripheral neuropathy: -Improved.  Not requiring gabapentin.  3.  Diarrhea: -Diarrhea resolved completely.  4.  Diabetes: -He reports his fasting blood sugar is staying between 120 and 130.  Orders placed this encounter:  Orders Placed This Encounter  Procedures  . CT Abdomen Pelvis W Contrast  . CT Chest W Contrast  . CBC with Differential/Platelet  .  Comprehensive metabolic panel  . Magnesium  . Lactate dehydrogenase  . [place] [phone]

## 2019-07-07 NOTE — Progress Notes (Signed)
Dresser tolerated Port lab draw well without complaints or incident. Pt seen by Dr. Jarold Song and no new orders obtained. Pt discharged self ambulatory using his cane in satisfactory condition accompanied by family member

## 2019-07-07 NOTE — Addendum Note (Signed)
Addended by: Anselmo Rod on: 07/07/2019 11:43 AM   Modules accepted: Orders

## 2019-07-08 LAB — CEA: CEA: 5.6 ng/mL — ABNORMAL HIGH (ref 0.0–4.7)

## 2019-09-02 ENCOUNTER — Ambulatory Visit (HOSPITAL_COMMUNITY)
Admission: RE | Admit: 2019-09-02 | Discharge: 2019-09-02 | Disposition: A | Discharge status: Home / Self Care | Payer: Medicare Other | Source: Ambulatory Visit | Attending: Hematology | Admitting: Hematology

## 2019-09-02 ENCOUNTER — Other Ambulatory Visit: Payer: Self-pay

## 2019-09-02 ENCOUNTER — Inpatient Hospital Stay (HOSPITAL_COMMUNITY): Payer: Medicare Other | Attending: Hematology

## 2019-09-02 DIAGNOSIS — Z79899 Other long term (current) drug therapy: Secondary | ICD-10-CM | POA: Insufficient documentation

## 2019-09-02 DIAGNOSIS — E119 Type 2 diabetes mellitus without complications: Secondary | ICD-10-CM | POA: Diagnosis not present

## 2019-09-02 DIAGNOSIS — Z87891 Personal history of nicotine dependence: Secondary | ICD-10-CM | POA: Diagnosis not present

## 2019-09-02 DIAGNOSIS — Z9221 Personal history of antineoplastic chemotherapy: Secondary | ICD-10-CM | POA: Diagnosis not present

## 2019-09-02 DIAGNOSIS — G4730 Sleep apnea, unspecified: Secondary | ICD-10-CM | POA: Insufficient documentation

## 2019-09-02 DIAGNOSIS — G629 Polyneuropathy, unspecified: Secondary | ICD-10-CM | POA: Diagnosis not present

## 2019-09-02 DIAGNOSIS — E7800 Pure hypercholesterolemia, unspecified: Secondary | ICD-10-CM | POA: Diagnosis not present

## 2019-09-02 DIAGNOSIS — Z794 Long term (current) use of insulin: Secondary | ICD-10-CM | POA: Diagnosis not present

## 2019-09-02 DIAGNOSIS — C184 Malignant neoplasm of transverse colon: Secondary | ICD-10-CM | POA: Insufficient documentation

## 2019-09-02 DIAGNOSIS — Z86711 Personal history of pulmonary embolism: Secondary | ICD-10-CM | POA: Diagnosis not present

## 2019-09-02 DIAGNOSIS — J449 Chronic obstructive pulmonary disease, unspecified: Secondary | ICD-10-CM | POA: Insufficient documentation

## 2019-09-02 DIAGNOSIS — I10 Essential (primary) hypertension: Secondary | ICD-10-CM | POA: Insufficient documentation

## 2019-09-02 LAB — CBC WITH DIFFERENTIAL/PLATELET
Abs Immature Granulocytes: 0.02 10*3/uL (ref 0.00–0.07)
Basophils Absolute: 0.1 10*3/uL (ref 0.0–0.1)
Basophils Relative: 1 %
Eosinophils Absolute: 0.4 10*3/uL (ref 0.0–0.5)
Eosinophils Relative: 4 %
HCT: 41.4 % (ref 39.0–52.0)
Hemoglobin: 13.2 g/dL (ref 13.0–17.0)
Immature Granulocytes: 0 %
Lymphocytes Relative: 44 %
Lymphs Abs: 5.3 10*3/uL — ABNORMAL HIGH (ref 0.7–4.0)
MCH: 32.6 pg (ref 26.0–34.0)
MCHC: 31.9 g/dL (ref 30.0–36.0)
MCV: 102.2 fL — ABNORMAL HIGH (ref 80.0–100.0)
Monocytes Absolute: 1.3 10*3/uL — ABNORMAL HIGH (ref 0.1–1.0)
Monocytes Relative: 11 %
Neutro Abs: 4.8 10*3/uL (ref 1.7–7.7)
Neutrophils Relative %: 40 %
Platelets: 183 10*3/uL (ref 150–400)
RBC: 4.05 MIL/uL — ABNORMAL LOW (ref 4.22–5.81)
RDW: 12.4 % (ref 11.5–15.5)
WBC: 11.8 10*3/uL — ABNORMAL HIGH (ref 4.0–10.5)
nRBC: 0 % (ref 0.0–0.2)

## 2019-09-02 LAB — COMPREHENSIVE METABOLIC PANEL
ALT: 25 U/L (ref 0–44)
AST: 25 U/L (ref 15–41)
Albumin: 4 g/dL (ref 3.5–5.0)
Alkaline Phosphatase: 55 U/L (ref 38–126)
Anion gap: 9 (ref 5–15)
BUN: 17 mg/dL (ref 8–23)
CO2: 26 mmol/L (ref 22–32)
Calcium: 8.9 mg/dL (ref 8.9–10.3)
Chloride: 101 mmol/L (ref 98–111)
Creatinine, Ser: 0.79 mg/dL (ref 0.61–1.24)
GFR calc Af Amer: >60 mL/min (ref >60)
GFR calc non Af Amer: >60 mL/min (ref >60)
Glucose, Bld: 82 mg/dL (ref 70–99)
Potassium: 3.9 mmol/L (ref 3.5–5.1)
Sodium: 136 mmol/L (ref 135–145)
Total Bilirubin: 0.8 mg/dL (ref 0.3–1.2)
Total Protein: 7.2 g/dL (ref 6.5–8.1)

## 2019-09-02 LAB — LACTATE DEHYDROGENASE: LDH: 185 U/L (ref 98–192)

## 2019-09-02 LAB — MAGNESIUM: Magnesium: 1.9 mg/dL (ref 1.7–2.4)

## 2019-09-02 LAB — POCT I-STAT CREATININE: Creatinine, Ser: 0.8 mg/dL (ref 0.61–1.24)

## 2019-09-02 MED ORDER — IOHEXOL 300 MG/ML  SOLN
100.0000 mL | Freq: Once | INTRAMUSCULAR | Status: AC | PRN
  Administered 2019-09-02: 100 mL via INTRAVENOUS

## 2019-09-02 MED ORDER — SODIUM CHLORIDE 0.9% FLUSH
10.0000 mL | INTRAVENOUS | Status: DC | PRN
  Administered 2019-09-02 (×2): 10 mL via INTRAVENOUS

## 2019-09-02 MED ORDER — HEPARIN SOD (PORK) LOCK FLUSH 100 UNIT/ML IV SOLN
500.0000 [IU] | Freq: Once | INTRAVENOUS | Status: AC
  Administered 2019-09-02: 500 [IU] via INTRAVENOUS

## 2019-09-02 NOTE — Progress Notes (Signed)
Cancer center staff was called to CT to access patient. They will send patient to the cancer center once his scan is complete for [place] to draw labs, flush port and deaccess.

## 2019-09-02 NOTE — Progress Notes (Signed)
Patient arrived at the cancer center for lab draw and port flush and deaccess.  He is here with his granddaughter.  Port flushed without incidence.  Patient discharged ambulatory and in stable condition from clinic.  He will follow up next week as scheduled.

## 2019-09-03 LAB — CEA: CEA: 4.2 ng/mL (ref 0.0–4.7)

## 2019-09-07 ENCOUNTER — Encounter (HOSPITAL_COMMUNITY): Payer: Medicare Other

## 2019-09-07 ENCOUNTER — Ambulatory Visit (HOSPITAL_COMMUNITY): Payer: Medicare Other | Admitting: Hematology

## 2019-09-08 ENCOUNTER — Inpatient Hospital Stay (HOSPITAL_COMMUNITY): Payer: Medicare Other | Attending: Hematology | Admitting: Hematology

## 2019-09-08 ENCOUNTER — Encounter (HOSPITAL_COMMUNITY): Payer: Medicare Other

## 2019-09-08 ENCOUNTER — Other Ambulatory Visit: Payer: Self-pay

## 2019-09-08 DIAGNOSIS — C189 Malignant neoplasm of colon, unspecified: Secondary | ICD-10-CM | POA: Diagnosis not present

## 2019-09-08 NOTE — Progress Notes (Signed)
Virtual Visit via Telephone Note  I connected with SEYDOU HEARNS on 09/08/19 at  2:15 PM EDT by telephone and verified that I am speaking with the correct person using two identifiers.   I discussed the limitations, risks, security and privacy concerns of performing an evaluation and management service by telephone and the availability of in person appointments. I also discussed with the patient that there may be a patient responsible charge related to this service. The patient expressed understanding and agreed to proceed.   History of Present Illness: Patient seen for follow-up of stage IV colon cancer.  He was treated with FOLFOX followed by FOLFIRI with bevacizumab followed by 5-FU and bevacizumab maintenance until 02/22/2019.   Observations/Objective: He denies any fevers, night sweats or weight loss.  No bleeding per rectum or melena.  Numbness in the feet has been stable.  Denies any diarrhea but has some constipation.  Appetite is 100%.  Energy levels are 75%.  Assessment and Plan:  1.  Stage IV colon cancer, MSI high: -I reviewed results of CT CAP from 09/02/2019 which did not show any metastatic disease.  Unchanged prominent mediastinal lymph nodes, subcarinal lymph node measuring 2.3x1.3 cm. -I reviewed labs from 09/02/2019.  CEA is 4.2.  Hemoglobin is normal.  LFTs are normal. -I have recommended follow-up in 4 months with repeat CEA, routine labs and CT scan.   Follow Up Instructions: RTC 4 months with labs and scan.   I discussed the assessment and treatment plan with the patient. The patient was provided an opportunity to ask questions and all were answered. The patient agreed with the plan and demonstrated an understanding of the instructions.   The patient was advised to call back or seek an in-person evaluation if the symptoms worsen or if the condition fails to improve as anticipated.  I provided 11 minutes of non-face-to-face time during this encounter.   Derek Jack, MD

## 2019-09-24 IMAGING — PT NM PET TUM IMG RESTAG (PS) SKULL BASE T - THIGH
3 series · 21 of 25 positions shown · non-contrast
Comparison: Multiple exams, including 04/17/2017

CLINICAL DATA: Subsequent treatment strategy for transverse colon
cancer, prior colon resection, ongoing chemotherapy.

EXAM:
NUCLEAR MEDICINE PET SKULL BASE TO THIGH
TECHNIQUE: 11.5 mCi F-18 FDG was injected intravenously. Full-ring PET imaging
was performed from the skull base to thigh after the radiotracer. CT
data was obtained and used for attenuation correction and anatomic
localization.
Fasting blood glucose: 100 mg/dl

[axial ct wb fusion · 14 of 194 slices shown]
[im 1/194]
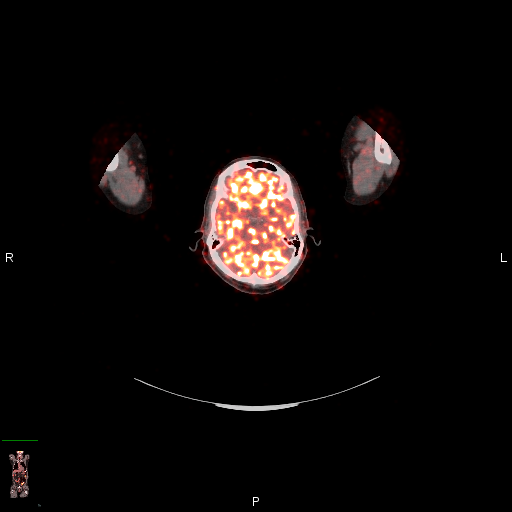
[im 13/194]
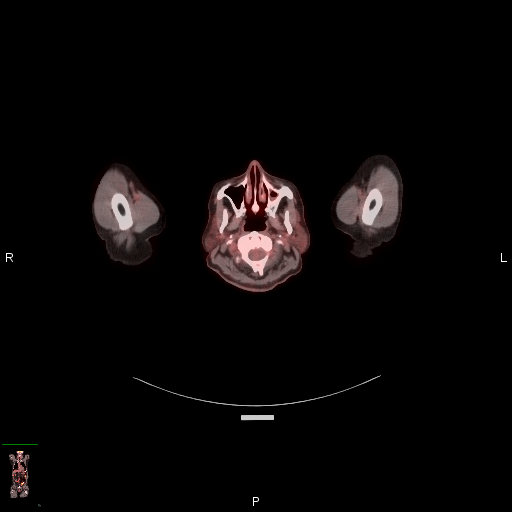
[im 25/194]
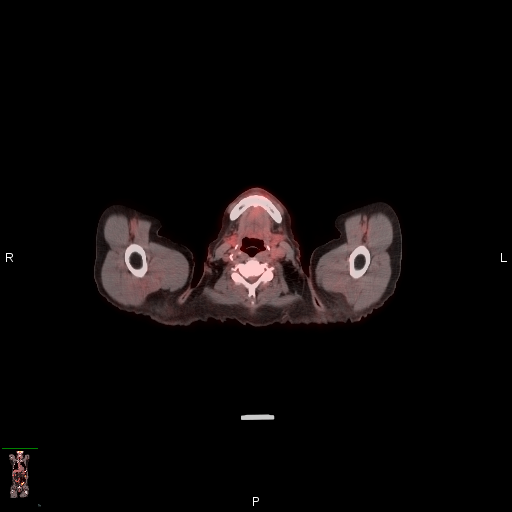
[im 49/194]
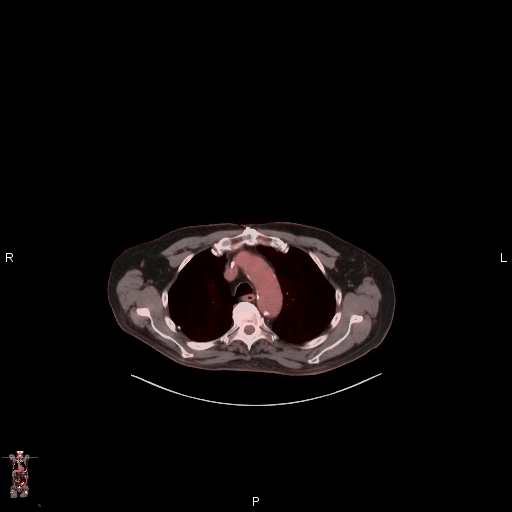
[im 61/194]
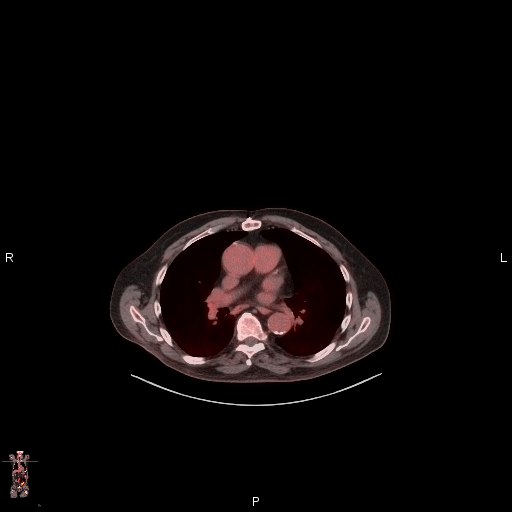
[im 73/194]
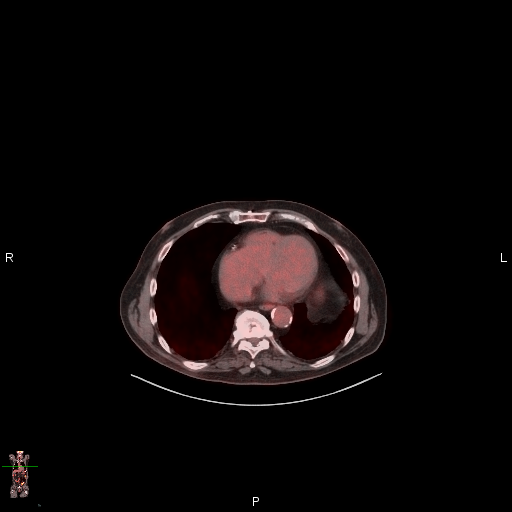
[im 85/194]
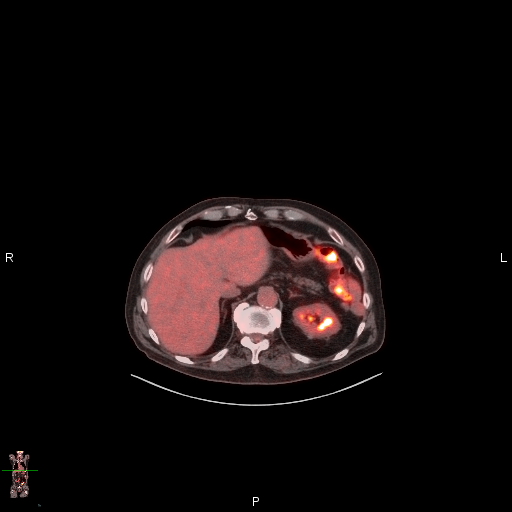
[im 97/194]
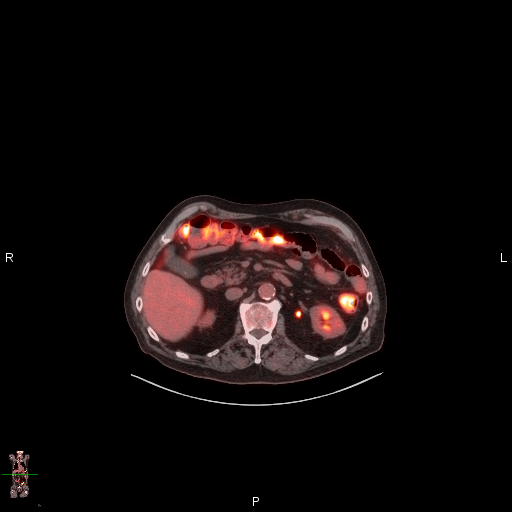
[im 121/194]
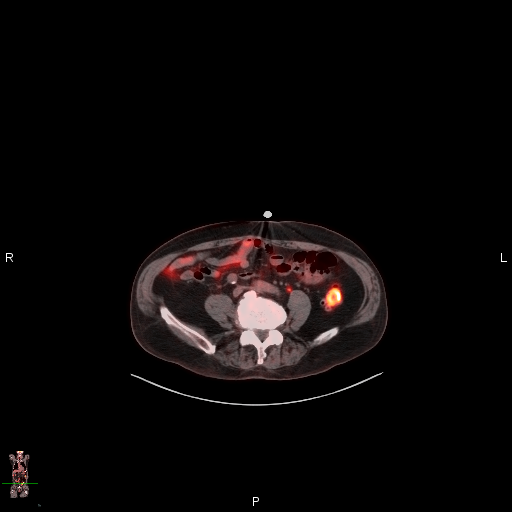
[im 133/194]
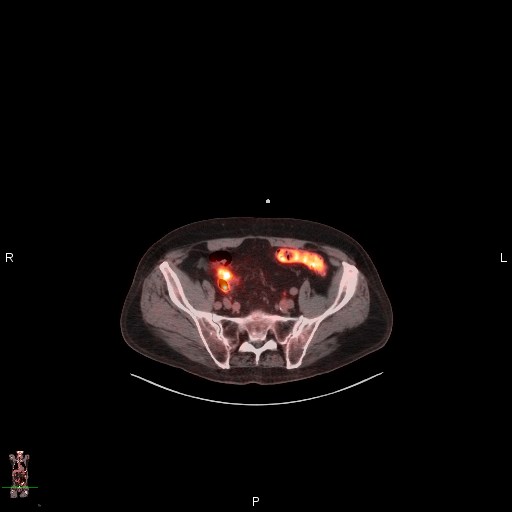
[im 145/194]
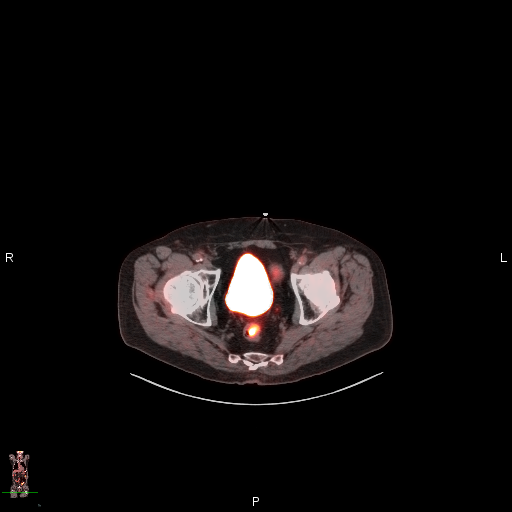
[im 157/194]
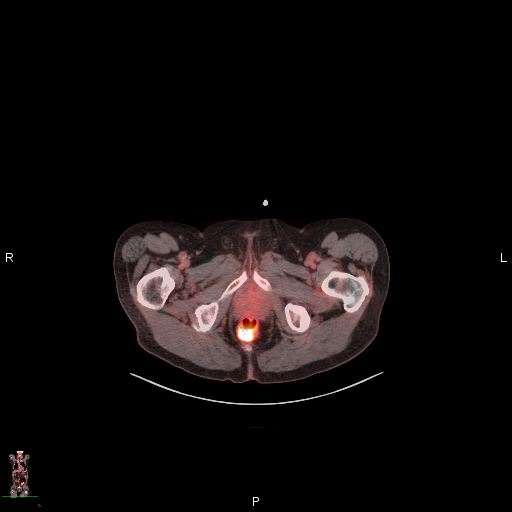
[im 169/194]
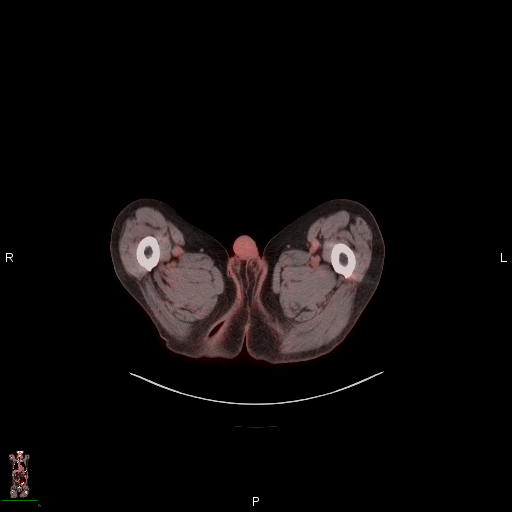
[im 194/194]
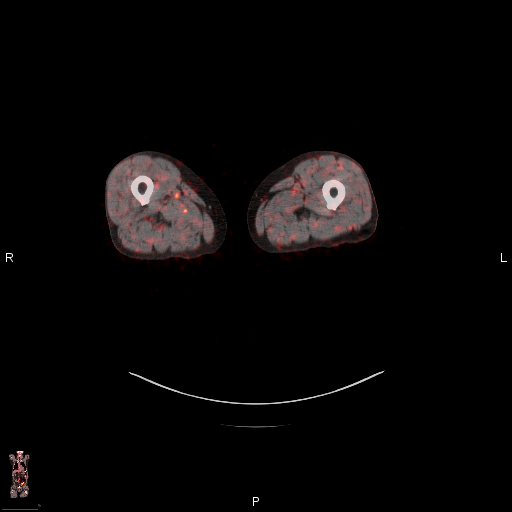

[mip · 4 of 48 slices shown]
[im 1/48]
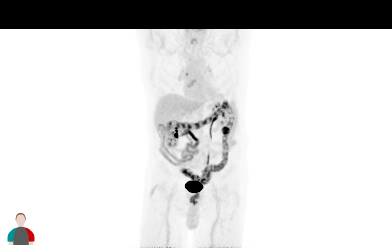
[im 16/48]
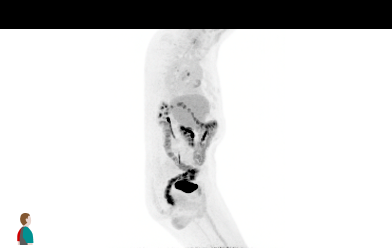
[im 32/48]
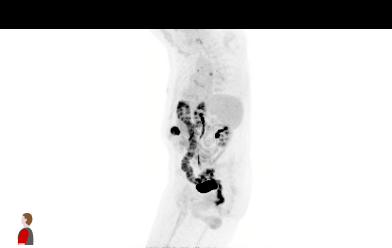
[im 48/48]
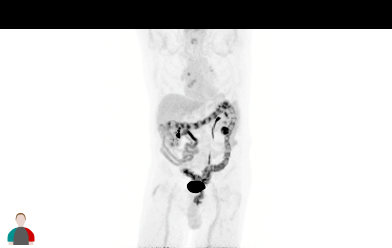

[coronal ct wb fusion · 3 of 47 slices shown]
[im 16/47]
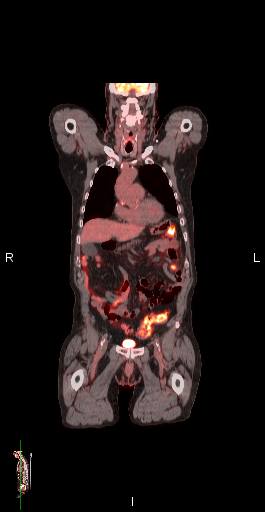
[im 31/47]
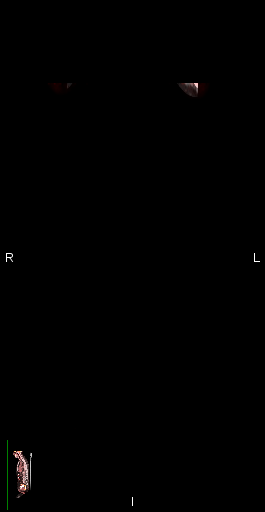
[im 47/47]
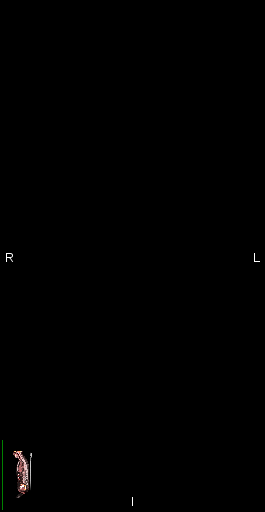

[21 of 25 positions shown; findings below may reference images not displayed]

FINDINGS: Mediastinal blood pool activity: SUV max

NECK: Focal right posterior glottic activity maximum SUV
compared to 3.2 on the contralateral side, probably
physiologic/incidental.

Incidental CT findings: Chronic bilateral maxillary sinusitis.
Bilateral common carotid atherosclerotic calcification.

CHEST: Low-level multifocal activity along left hilar lymph nodes,
maximum SUV 3.8, formerly 2.6. Right infrahilar nodal activity
maximum SUV 3.1, formerly 2.5.

Focal activity along the tip of the Port-A-Cath, likely along a
fibrin sheath or similar incidental cause. Chronic activity along
the aortic valve prosthesis.

Incidental CT findings: Coronary, aortic arch, and branch vessel
atherosclerotic vascular disease. Ascending thoracic aortic
aneurysm, 4.4 cm in diameter. Biapical pleuroparenchymal
calcifications. Calcified granuloma or less likely hamartoma in the
posterior basal segment right lower lobe.

ABDOMEN/PELVIS: The left upper quadrant omental nodule tracks along
the peritoneum and measures 4.1 by 4.1 cm (formerly 2.5 by 1.8 cm)
with maximum SUV 14.7 (formerly 10.6).

A suspected lymph node or ectopic splenic tissue in the vicinity of
the rectosigmoid junction measures 1.3 cm in short axis (formerly
the same) and has a maximum SUV of 2.9. Left upper quadrant
nodularity with faint calcification measures 1.4 by 1.9 cm, similar
to prior exam, does not appear hypermetabolic and likely represents
regenerative splenic tissue.

Considerable physiologic uptake in bowel.

Incidental CT findings: 1.0 cm stable left kidney upper pole
hyperdense lesion, likely a complex cyst. Non rotated right kidney.
Cholelithiasis. Aortoiliac atherosclerotic vascular disease.
Descending and sigmoid colon diverticulosis. Prostatomegaly. Right
hemicolectomy.

SKELETON: No findings of skeletal hypermetabolic activity.

Incidental CT findings: Lumbar spondylosis. Degenerative arthropathy
of both hips.
IMPRESSION: 1. Increase in size and metabolic activity of the anterior left
upper quadrant omental metastatic lesion.
2. Nonspecific low-grade metabolic activity in several small left
hilar lymph nodes, with activity mildly increased from 04/17/2017
but relatively similar to [DATE] [DATE]. I am uncertain whether
these are reactive or neoplastic.
3. Other imaging findings of potential clinical significance: Aortic
Atherosclerosis ([id number]). Chronic maxillary sinusitis. Coronary
atherosclerosis. Ascending thoracic aortic aneurysm measured 4.4 cm
diameter. Hyperdense stable left kidney upper pole lesion, likely a
complex cyst.

## 2019-12-11 ENCOUNTER — Encounter (HOSPITAL_COMMUNITY): Payer: Self-pay | Admitting: *Deleted

## 2019-12-11 ENCOUNTER — Observation Stay (HOSPITAL_COMMUNITY)
Admission: EM | Admit: 2019-12-11 | Discharge: 2019-12-12 | Disposition: A | Discharge status: Home / Self Care | Payer: Medicare Other | Attending: Internal Medicine | Admitting: Internal Medicine

## 2019-12-11 ENCOUNTER — Emergency Department (HOSPITAL_COMMUNITY): Payer: Medicare Other

## 2019-12-11 ENCOUNTER — Other Ambulatory Visit: Payer: Self-pay

## 2019-12-11 DIAGNOSIS — Z95828 Presence of other vascular implants and grafts: Secondary | ICD-10-CM

## 2019-12-11 DIAGNOSIS — Z952 Presence of prosthetic heart valve: Secondary | ICD-10-CM

## 2019-12-11 DIAGNOSIS — E785 Hyperlipidemia, unspecified: Secondary | ICD-10-CM | POA: Diagnosis not present

## 2019-12-11 DIAGNOSIS — N189 Chronic kidney disease, unspecified: Secondary | ICD-10-CM | POA: Diagnosis not present

## 2019-12-11 DIAGNOSIS — E119 Type 2 diabetes mellitus without complications: Secondary | ICD-10-CM | POA: Diagnosis not present

## 2019-12-11 DIAGNOSIS — E860 Dehydration: Secondary | ICD-10-CM | POA: Diagnosis not present

## 2019-12-11 DIAGNOSIS — C184 Malignant neoplasm of transverse colon: Secondary | ICD-10-CM | POA: Diagnosis present

## 2019-12-11 DIAGNOSIS — Z79899 Other long term (current) drug therapy: Secondary | ICD-10-CM | POA: Diagnosis not present

## 2019-12-11 DIAGNOSIS — I10 Essential (primary) hypertension: Secondary | ICD-10-CM | POA: Insufficient documentation

## 2019-12-11 DIAGNOSIS — R0602 Shortness of breath: Secondary | ICD-10-CM | POA: Diagnosis present

## 2019-12-11 DIAGNOSIS — G4730 Sleep apnea, unspecified: Secondary | ICD-10-CM | POA: Diagnosis present

## 2019-12-11 DIAGNOSIS — Z87891 Personal history of nicotine dependence: Secondary | ICD-10-CM | POA: Diagnosis not present

## 2019-12-11 DIAGNOSIS — J449 Chronic obstructive pulmonary disease, unspecified: Secondary | ICD-10-CM | POA: Diagnosis present

## 2019-12-11 DIAGNOSIS — E7800 Pure hypercholesterolemia, unspecified: Secondary | ICD-10-CM

## 2019-12-11 DIAGNOSIS — Z66 Do not resuscitate: Secondary | ICD-10-CM | POA: Diagnosis present

## 2019-12-11 DIAGNOSIS — U071 COVID-19: Principal | ICD-10-CM | POA: Diagnosis present

## 2019-12-11 DIAGNOSIS — R531 Weakness: Secondary | ICD-10-CM | POA: Diagnosis not present

## 2019-12-11 DIAGNOSIS — J1282 Pneumonia due to coronavirus disease 2019: Secondary | ICD-10-CM | POA: Insufficient documentation

## 2019-12-11 DIAGNOSIS — R112 Nausea with vomiting, unspecified: Secondary | ICD-10-CM | POA: Diagnosis present

## 2019-12-11 DIAGNOSIS — Z794 Long term (current) use of insulin: Secondary | ICD-10-CM | POA: Diagnosis not present

## 2019-12-11 DIAGNOSIS — N179 Acute kidney failure, unspecified: Secondary | ICD-10-CM | POA: Diagnosis present

## 2019-12-11 DIAGNOSIS — Z86711 Personal history of pulmonary embolism: Secondary | ICD-10-CM | POA: Diagnosis present

## 2019-12-11 DIAGNOSIS — R739 Hyperglycemia, unspecified: Secondary | ICD-10-CM | POA: Diagnosis present

## 2019-12-11 DIAGNOSIS — N289 Disorder of kidney and ureter, unspecified: Secondary | ICD-10-CM

## 2019-12-11 DIAGNOSIS — Z9049 Acquired absence of other specified parts of digestive tract: Secondary | ICD-10-CM

## 2019-12-11 LAB — COMPREHENSIVE METABOLIC PANEL
ALT: 32 U/L (ref 0–44)
AST: 52 U/L — ABNORMAL HIGH (ref 15–41)
Albumin: 3.6 g/dL (ref 3.5–5.0)
Alkaline Phosphatase: 43 U/L (ref 38–126)
Anion gap: 9 (ref 5–15)
BUN: 26 mg/dL — ABNORMAL HIGH (ref 8–23)
CO2: 23 mmol/L (ref 22–32)
Calcium: 8.6 mg/dL — ABNORMAL LOW (ref 8.9–10.3)
Chloride: 106 mmol/L (ref 98–111)
Creatinine, Ser: 1.32 mg/dL — ABNORMAL HIGH (ref 0.61–1.24)
GFR calc Af Amer: 57 mL/min — ABNORMAL LOW (ref >60)
GFR calc non Af Amer: 49 mL/min — ABNORMAL LOW (ref >60)
Glucose, Bld: 158 mg/dL — ABNORMAL HIGH (ref 70–99)
Potassium: 5 mmol/L (ref 3.5–5.1)
Sodium: 138 mmol/L (ref 135–145)
Total Bilirubin: 0.9 mg/dL (ref 0.3–1.2)
Total Protein: 7.5 g/dL (ref 6.5–8.1)

## 2019-12-11 LAB — CBC WITH DIFFERENTIAL/PLATELET
Abs Immature Granulocytes: 0.03 10*3/uL (ref 0.00–0.07)
Basophils Absolute: 0 10*3/uL (ref 0.0–0.1)
Basophils Relative: 0 %
Eosinophils Absolute: 0.1 10*3/uL (ref 0.0–0.5)
Eosinophils Relative: 2 %
HCT: 42.9 % (ref 39.0–52.0)
Hemoglobin: 13.9 g/dL (ref 13.0–17.0)
Immature Granulocytes: 1 %
Lymphocytes Relative: 37 %
Lymphs Abs: 2.4 10*3/uL (ref 0.7–4.0)
MCH: 32.8 pg (ref 26.0–34.0)
MCHC: 32.4 g/dL (ref 30.0–36.0)
MCV: 101.2 fL — ABNORMAL HIGH (ref 80.0–100.0)
Monocytes Absolute: 0.8 10*3/uL (ref 0.1–1.0)
Monocytes Relative: 12 %
Neutro Abs: 3.1 10*3/uL (ref 1.7–7.7)
Neutrophils Relative %: 48 %
Platelets: 153 10*3/uL (ref 150–400)
RBC: 4.24 MIL/uL (ref 4.22–5.81)
RDW: 12.8 % (ref 11.5–15.5)
WBC: 6.5 10*3/uL (ref 4.0–10.5)
nRBC: 0 % (ref 0.0–0.2)

## 2019-12-11 LAB — SARS CORONAVIRUS 2 BY RT PCR (HOSPITAL ORDER, PERFORMED IN ~~LOC~~ HOSPITAL LAB): SARS Coronavirus 2: POSITIVE — AB

## 2019-12-11 LAB — BRAIN NATRIURETIC PEPTIDE: B Natriuretic Peptide: 68 pg/mL (ref 0.0–100.0)

## 2019-12-11 LAB — PROCALCITONIN: Procalcitonin: <0.1 ng/mL

## 2019-12-11 LAB — FERRITIN: Ferritin: 1897 ng/mL — ABNORMAL HIGH (ref 24–336)

## 2019-12-11 LAB — TROPONIN I (HIGH SENSITIVITY): Troponin I (High Sensitivity): 12 ng/L (ref <18)

## 2019-12-11 LAB — HEMOGLOBIN A1C
Hgb A1c MFr Bld: 7.1 % — ABNORMAL HIGH (ref 4.8–5.6)
Mean Plasma Glucose: 157.07 mg/dL

## 2019-12-11 LAB — CBG MONITORING, ED: Glucose-Capillary: 104 mg/dL — ABNORMAL HIGH (ref 70–99)

## 2019-12-11 LAB — D-DIMER, QUANTITATIVE: D-Dimer, Quant: 1.55 ug/mL-FEU — ABNORMAL HIGH (ref 0.00–0.50)

## 2019-12-11 LAB — C-REACTIVE PROTEIN: CRP: 2.2 mg/dL — ABNORMAL HIGH (ref <1.0)

## 2019-12-11 LAB — VITAMIN D 25 HYDROXY (VIT D DEFICIENCY, FRACTURES): Vit D, 25-Hydroxy: 59.67 ng/mL (ref 30–100)

## 2019-12-11 MED ORDER — ALBUTEROL SULFATE HFA 108 (90 BASE) MCG/ACT IN AERS: 2.0000 | INHALATION_SPRAY | RESPIRATORY_TRACT | Status: DC | PRN

## 2019-12-11 MED ORDER — IOHEXOL 350 MG/ML SOLN
75.0000 mL | Freq: Once | INTRAVENOUS | Status: AC | PRN
  Administered 2019-12-11: 75 mL via INTRAVENOUS

## 2019-12-11 MED ORDER — ENOXAPARIN SODIUM 40 MG/0.4ML ~~LOC~~ SOLN
40.0000 mg | SUBCUTANEOUS | Status: DC
  Administered 2019-12-12: 40 mg via SUBCUTANEOUS
  Filled 2019-12-11: qty 0.4

## 2019-12-11 MED ORDER — BISACODYL 5 MG PO TBEC: 5.0000 mg | DELAYED_RELEASE_TABLET | Freq: Every day | ORAL | Status: DC | PRN

## 2019-12-11 MED ORDER — INSULIN ASPART 100 UNIT/ML ~~LOC~~ SOLN
0.0000 [IU] | Freq: Three times a day (TID) | SUBCUTANEOUS | Status: DC
  Administered 2019-12-12: 5 [IU] via SUBCUTANEOUS
  Administered 2019-12-12: 3 [IU] via SUBCUTANEOUS
  Filled 2019-12-11 (×2): qty 1

## 2019-12-11 MED ORDER — SODIUM CHLORIDE 0.9 % IV SOLN: INTRAVENOUS | Status: DC

## 2019-12-11 MED ORDER — INSULIN ASPART 100 UNIT/ML ~~LOC~~ SOLN
2.0000 [IU] | Freq: Three times a day (TID) | SUBCUTANEOUS | Status: DC
  Administered 2019-12-11 – 2019-12-12 (×3): 2 [IU] via SUBCUTANEOUS
  Filled 2019-12-11: qty 1

## 2019-12-11 MED ORDER — PREDNISONE 20 MG PO TABS: 40.0000 mg | ORAL_TABLET | Freq: Every day | ORAL | Status: DC

## 2019-12-11 MED ORDER — IPRATROPIUM-ALBUTEROL 20-100 MCG/ACT IN AERS
1.0000 | INHALATION_SPRAY | Freq: Four times a day (QID) | RESPIRATORY_TRACT | Status: DC
  Administered 2019-12-11 (×2): 1 via RESPIRATORY_TRACT
  Filled 2019-12-11: qty 4

## 2019-12-11 MED ORDER — ONDANSETRON HCL 4 MG PO TABS: 4.0000 mg | ORAL_TABLET | Freq: Four times a day (QID) | ORAL | Status: DC | PRN

## 2019-12-11 MED ORDER — METHYLPREDNISOLONE SODIUM SUCC 40 MG IJ SOLR
40.0000 mg | Freq: Two times a day (BID) | INTRAMUSCULAR | Status: DC
  Administered 2019-12-11 – 2019-12-12 (×2): 40 mg via INTRAVENOUS
  Filled 2019-12-11 (×2): qty 1

## 2019-12-11 MED ORDER — VITAMIN B-12 1000 MCG PO TABS
1000.0000 ug | ORAL_TABLET | Freq: Every day | ORAL | Status: DC
  Administered 2019-12-12: 1000 ug via ORAL
  Filled 2019-12-11: qty 1

## 2019-12-11 MED ORDER — INSULIN DETEMIR 100 UNIT/ML ~~LOC~~ SOLN
0.0750 [IU]/kg | Freq: Two times a day (BID) | SUBCUTANEOUS | Status: DC
  Administered 2019-12-11 – 2019-12-12 (×3): 6 [IU] via SUBCUTANEOUS
  Filled 2019-12-11 (×5): qty 0.06

## 2019-12-11 MED ORDER — ATORVASTATIN CALCIUM 10 MG PO TABS: 10.0000 mg | ORAL_TABLET | Freq: Every day | ORAL | Status: DC

## 2019-12-11 MED ORDER — SODIUM CHLORIDE 0.9 % IV BOLUS
500.0000 mL | Freq: Once | INTRAVENOUS | Status: AC
  Administered 2019-12-11: 500 mL via INTRAVENOUS

## 2019-12-11 MED ORDER — IPRATROPIUM-ALBUTEROL 20-100 MCG/ACT IN AERS
1.0000 | INHALATION_SPRAY | Freq: Three times a day (TID) | RESPIRATORY_TRACT | Status: DC
  Administered 2019-12-12 (×2): 1 via RESPIRATORY_TRACT

## 2019-12-11 MED ORDER — PANTOPRAZOLE SODIUM 40 MG PO TBEC
40.0000 mg | DELAYED_RELEASE_TABLET | Freq: Every day | ORAL | Status: DC
  Administered 2019-12-11 – 2019-12-12 (×2): 40 mg via ORAL
  Filled 2019-12-11 (×2): qty 1

## 2019-12-11 MED ORDER — GUAIFENESIN-DM 100-10 MG/5ML PO SYRP: 10.0000 mL | ORAL_SOLUTION | ORAL | Status: DC | PRN

## 2019-12-11 MED ORDER — SODIUM CHLORIDE 0.9 % IV SOLN: 100.0000 mg | Freq: Every day | INTRAVENOUS | Status: DC

## 2019-12-11 MED ORDER — OXYCODONE HCL 5 MG PO TABS: 2.5000 mg | ORAL_TABLET | ORAL | Status: DC | PRN

## 2019-12-11 MED ORDER — LINAGLIPTIN 5 MG PO TABS
5.0000 mg | ORAL_TABLET | Freq: Every day | ORAL | Status: DC
  Administered 2019-12-12: 5 mg via ORAL
  Filled 2019-12-11 (×3): qty 1

## 2019-12-11 MED ORDER — HYDROCOD POLST-CPM POLST ER 10-8 MG/5ML PO SUER: 5.0000 mL | Freq: Two times a day (BID) | ORAL | Status: DC | PRN

## 2019-12-11 MED ORDER — SODIUM CHLORIDE 0.9 % IV SOLN
100.0000 mg | Freq: Every day | INTRAVENOUS | Status: DC
  Administered 2019-12-12: 100 mg via INTRAVENOUS
  Filled 2019-12-11: qty 20

## 2019-12-11 MED ORDER — INSULIN ASPART 100 UNIT/ML ~~LOC~~ SOLN
0.0000 [IU] | Freq: Every day | SUBCUTANEOUS | Status: DC
  Administered 2019-12-12: 3 [IU] via SUBCUTANEOUS
  Filled 2019-12-11: qty 1

## 2019-12-11 MED ORDER — SODIUM CHLORIDE 0.9 % IV SOLN
100.0000 mg | INTRAVENOUS | Status: AC
  Administered 2019-12-11 (×2): 100 mg via INTRAVENOUS
  Filled 2019-12-11 (×2): qty 20

## 2019-12-11 MED ORDER — ACETAMINOPHEN 325 MG PO TABS: 650.0000 mg | ORAL_TABLET | Freq: Four times a day (QID) | ORAL | Status: DC | PRN

## 2019-12-11 MED ORDER — VITAMIN D 25 MCG (1000 UNIT) PO TABS
5000.0000 [IU] | ORAL_TABLET | Freq: Every day | ORAL | Status: DC
  Administered 2019-12-12: 5000 [IU] via ORAL
  Filled 2019-12-11: qty 5

## 2019-12-11 MED ORDER — ONDANSETRON HCL 4 MG/2ML IJ SOLN: 4.0000 mg | Freq: Four times a day (QID) | INTRAMUSCULAR | Status: DC | PRN

## 2019-12-11 MED ORDER — SODIUM CHLORIDE 0.9 % IV SOLN: 200.0000 mg | Freq: Once | INTRAVENOUS | Status: DC

## 2019-12-11 MED ORDER — ASCORBIC ACID 500 MG PO TABS
500.0000 mg | ORAL_TABLET | Freq: Every day | ORAL | Status: DC
  Administered 2019-12-11 – 2019-12-12 (×2): 500 mg via ORAL
  Filled 2019-12-11 (×2): qty 1

## 2019-12-11 MED ORDER — TRAZODONE HCL 50 MG PO TABS: 25.0000 mg | ORAL_TABLET | Freq: Every evening | ORAL | Status: DC | PRN

## 2019-12-11 MED ORDER — ZINC SULFATE 220 (50 ZN) MG PO CAPS
220.0000 mg | ORAL_CAPSULE | Freq: Every day | ORAL | Status: DC
  Administered 2019-12-11 – 2019-12-12 (×2): 220 mg via ORAL
  Filled 2019-12-11 (×2): qty 1

## 2019-12-11 NOTE — ED Provider Notes (Signed)
[facility] Provider Note   CSN: [id number] Arrival date & time: 12/11/19  1147     History Chief Complaint  Patient presents with  . Shortness of Breath    Kevin Kirk is a 86 y.o. male.  Patient with history of COPD, aortic valve replacement, pulmonary embolism after her valve replacement surgery presents with worsening shortness of breath, cough, diarrhea and hypoxia on home monitor down to 86% on room air.  Patient does not wear oxygen at home.  Patient is gradually felt worse the past 5 days, no recent known contacts to Covid.  Patient did not receive Covid vaccine as afraid of blood clot risks reported.  Granddaughter has been taking care of patient and unable to take care of him at home due to general weakness that is worsened.        Past Medical History:  Diagnosis Date  . Chronic pulmonary embolism ([place]) 22/0254   compliction of the valve replacement surgery  . COPD (chronic obstructive pulmonary disease) ([place])   . Diabetes ([place]) 04/22/2016  . H/O aortic valve replacement 10/2008  . Hypercholesteremia   . Hypertension   . Pulmonary emboli ([place]) 10/2008  . Sleep apnea     Patient Active Problem List   Diagnosis Date Noted  . Pneumonia due to COVID-19 virus 12/11/2019  . Nausea and vomiting 11/11/2018  . Dehydration 11/11/2018  . Port-A-Cath in place 05/18/2017  . Iron deficiency anemia 09/07/2016  . S/P partial colectomy 07/11/2016  . 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 High cholesterol 04/22/2016  . H/O aortic valve replacement 04/22/2016  . Diabetes ([place]) 04/22/2016  . Chronic pulmonary embolism ([place]) 04/22/2016  . Rectal bleeding 04/22/2016    Past Surgical History:  Procedure Laterality Date  . AORTIC VALVE REPLACEMENT     2010  . CARDIAC CATHETERIZATION  2010  . COLONOSCOPY N/A 06/05/2016   Procedure: COLONOSCOPY;  Surgeon: Rogene Houston, MD;  Location: [facility];  Service: Endoscopy;  Laterality: N/A;   . PARTIAL COLECTOMY N/A 07/11/2016   Procedure: PARTIAL COLECTOMY;  Surgeon: Aviva Signs, MD;  Location: [facility] ORS;  Service: General;  Laterality: N/A;  . PORTACATH PLACEMENT N/A 08/13/2016   Procedure: INSERTION PORT-A-CATH LEFT SUBCLAVIAN;  Surgeon: Aviva Signs, MD;  Location: [facility] ORS;  Service: General;  Laterality: N/A;  . [place]     from trauma       Family History  Problem Relation Age of Onset  . Colon cancer Neg Hx     Social History   Tobacco Use  . Smoking status: Former Smoker    Years: 15.00    Types: Cigarettes    Quit date: 1964    Years since quitting: 57.7  . Smokeless tobacco: Never Used  Vaping Use  . Vaping Use: Never used  Substance Use Topics  . Alcohol use: No  . Drug use: No    Home Medications Prior to Admission medications   Medication Sig Start Date End Date Taking? Authorizing Provider  atorvastatin (LIPITOR) 10 MG tablet Take 10 mg by mouth at bedtime.    Yes [provider]  Cholecalciferol (VITAMIN D3) 5000 units CAPS Take 5,000 Units by mouth daily.    Yes [provider]  lisinopril (PRINIVIL,ZESTRIL) 10 MG tablet Take 10 mg by mouth daily. 07/01/18  Yes [provider]  metoprolol (LOPRESSOR) 50 MG tablet Take 50 mg by mouth 2 (two)  times daily. 03/06/16  Yes [provider]  Multiple Vitamins-Minerals (CENTRUM SILVER 50+MEN PO) Take 1 tablet by mouth daily.   Yes [provider]  vitamin B-12 (CYANOCOBALAMIN) 1000 MCG tablet Take 1,000 mcg by mouth daily.    Yes [provider]  diphenoxylate-atropine (LOMOTIL) 2.5-0.025 MG tablet Take 1 tablet by mouth 4 (four) times daily as needed for diarrhea or loose stools. Patient not taking: Reported on 09/08/2019 08/13/18   Francene Finders L, NP-C  Insulin Regular Human (NOVOLIN R FLEXPEN) 100 UNIT/ML SOPN Inject 5 Units as directed daily. If sugar more than 150 Patient not taking: Reported on 12/11/2019  03/09/19   Derek Jack, MD  lidocaine-prilocaine (EMLA) cream Apply 1 application topically as needed. Patient not taking: Reported on 09/08/2019 10/27/18   Derek Jack, MD  ondansetron (ZOFRAN ODT) 8 MG disintegrating tablet Take 1 tablet (8 mg total) by mouth every 8 (eight) hours as needed for nausea or vomiting. Patient not taking: Reported on 09/08/2019 02/25/19   Roger Shelter, FNP  RELION GLUCOSE TEST STRIPS test strip USE 1 STRIP TO CHECK GLUCOSE ONCE DAILY Patient not taking: Reported on 12/11/2019 04/22/18   [provider]  prochlorperazine (COMPAZINE) 10 MG tablet Take 1 tablet (10 mg total) by mouth every 6 (six) hours as needed (Nausea or vomiting). 08/05/16 02/11/17  Twana First, MD    Allergies    Amoxicillin and Tamsulosin hcl  Review of Systems   Review of Systems  Constitutional: Positive for appetite change. Negative for chills and fever.  HENT: Negative for congestion.   Eyes: Negative for visual disturbance.  Respiratory: Positive for cough and shortness of breath.   Cardiovascular: Negative for chest pain.  Gastrointestinal: Positive for diarrhea and nausea. Negative for abdominal pain and vomiting.  Genitourinary: Negative for dysuria and flank pain.  Musculoskeletal: Negative for back pain, neck pain and neck stiffness.  Skin: Negative for rash.  Neurological: Positive for weakness. Negative for light-headedness and headaches.    Physical Exam Updated Vital Signs BP 110/63   Pulse 65   Temp 98.5 F (36.9 C) (Oral)   Resp (!) 25   Ht 6' (1.829 m)   Wt 85.5 kg   SpO2 96%   BMI 25.55 kg/m   Physical Exam Vitals and nursing note reviewed.  Constitutional:      Appearance: He is well-developed.  HENT:     Head: Normocephalic and atraumatic.     Comments: Dry mucous membranes Eyes:     General:        Right eye: No discharge.        Left eye: No discharge.     Conjunctiva/sclera: Conjunctivae normal.  Neck:     Trachea: No  tracheal deviation.  Cardiovascular:     Rate and Rhythm: Normal rate and regular rhythm.  Pulmonary:     Effort: Pulmonary effort is normal.     Breath sounds: Normal breath sounds.  Abdominal:     General: There is no distension.     Palpations: Abdomen is soft.     Tenderness: There is no abdominal tenderness. There is no guarding.  Musculoskeletal:     Cervical back: Normal range of motion and neck supple.  Skin:    General: Skin is warm.     Findings: No rash.  Neurological:     General: No focal deficit present.     Mental Status: He is alert and oriented to person, place, and time.     Comments:  General weakness on exam, needs assistance to sit up.     ED Results / Procedures / Treatments   Labs (all labs ordered are listed, but only abnormal results are displayed) Labs Reviewed  SARS CORONAVIRUS 2 BY RT PCR (HOSPITAL ORDER, [facility]) - Abnormal; Notable for the following components:      Result Value   SARS Coronavirus 2 POSITIVE (*)    All other components within normal limits  COMPREHENSIVE METABOLIC PANEL - Abnormal; Notable for the following components:   Glucose, Bld 158 (*)    BUN 26 (*)    Creatinine, Ser 1.32 (*)    Calcium 8.6 (*)    AST 52 (*)    GFR calc non Af Amer 49 (*)    GFR calc Af Amer 57 (*)    All other components within normal limits  CBC WITH DIFFERENTIAL/PLATELET - Abnormal; Notable for the following components:   MCV 101.2 (*)    All other components within normal limits  D-DIMER, QUANTITATIVE (NOT AT [facility]) - Abnormal; Notable for the following components:   D-Dimer, Quant 1.55 (*)    All other components within normal limits  BRAIN NATRIURETIC PEPTIDE  TROPONIN I (HIGH SENSITIVITY)    EKG EKG Interpretation  Date/Time:  Sunday December 11 2019 12:41:35 EDT Ventricular Rate:  71 PR Interval:    QRS Duration: 104 QT Interval:  392 QTC Calculation: 426 R Axis:   54 Text Interpretation: Sinus rhythm  Confirmed by Elnora Morrison [phone]) on 12/11/2019 12:45:45 PM   Radiology DG Chest Portable 1 View  Result Date: 12/11/2019 CLINICAL DATA:  Dyspnea, COPD EXAM: PORTABLE CHEST 1 VIEW COMPARISON:  08/13/2016 chest radiograph. FINDINGS: Intact sternotomy wires. Left subclavian Port-A-Cath terminates over the middle third of the SVC. Stable cardiomediastinal silhouette with normal heart size. No pneumothorax. No pleural effusion. Emphysema. No acute consolidative airspace disease. Scattered reticular and faint hazy opacities throughout both lungs appear new/increased. IMPRESSION: Emphysema. Scattered reticular and faint hazy opacities throughout both lungs, new/increased. Differential includes atypical/viral infection or interstitial lung disease. Electronically Signed   By: Ilona Sorrel M.D.   On: 12/11/2019 12:40    Procedures Procedures (including critical care time)  Medications Ordered in ED Medications  sodium chloride 0.9 % bolus 500 mL (has no administration in time range)  iohexol (OMNIPAQUE) 350 MG/ML injection 75 mL (has no administration in time range)  sodium chloride 0.9 % bolus 500 mL (0 mLs Intravenous Stopped 12/11/19 1347)    ED Course  I have reviewed the triage vital signs and the nursing notes.  Pertinent labs & imaging results that were available during my care of the patient were reviewed by me and considered in my medical decision making (see chart for details).    MDM Rules/Calculators/A&P                          Patient presents with worsening shortness of breath and viral-like symptoms.  Concern clinically for Covid/viral infection versus bacterial pneumonia versus heart failure versus less likely blood clot/ACS.  Given age and worsening dyspnea plan for screening blood work to check for anemia, heart failure, electrolyte abnormalities.  IV fluid bolus given.  Covid test pending.  Blood work reviewed and patient still compensating with normal electrolytes, kidney  function and BUN elevated creatinine 1.32, D-dimer elevated 1.5.  Patient having exertional dyspnea and with age plan for CT angiogram to look for pulmonary embolism.  Covid  test returned positive updated patient.  Discussed with hospitalist for admission for supportive care.  Kevin Kirk was evaluated in Emergency Department on 12/11/2019 for the symptoms described in the history of present illness. He was evaluated in the context of the global COVID-19 pandemic, which necessitated consideration that the patient might be at risk for infection with the SARS-CoV-2 virus that causes COVID-19. Institutional protocols and algorithms that pertain to the evaluation of patients at risk for COVID-19 are in a state of rapid change based on information released by regulatory bodies including the CDC and federal and state organizations. These policies and algorithms were followed during the patient's care in the ED.   Final Clinical Impression(s) / ED Diagnoses Final diagnoses:  Pneumonia due to COVID-19 virus  General weakness  Acute renal insufficiency    Rx / DC Orders ED Discharge Orders    None       Elnora Morrison, MD 12/11/19 1437

## 2019-12-11 NOTE — ED Triage Notes (Signed)
Pt with diarrhea for past 5 days.

## 2019-12-11 NOTE — ED Triage Notes (Signed)
Pt with SOB for 5 days, has not been tested for Covid and has not been vaccinated for Covid.  sats were low at 86% on RA and does not wear home oxygen.  Productive cough.

## 2019-12-11 NOTE — H&P (Addendum)
History and Physical  Homeworth WGN:[id number] DOB: 1933-01-05 DOA: 12/11/2019  PCP: Tobe Sos, MD  Patient coming from: Home   I have personally briefly reviewed patient's old medical records in [place]  Chief Complaint: shortness of breath  HPI: Kevin Kirk is a 86 y.o. male with medical history significant for stage IV colon cancer, COPD, remote pulmonary emboli not currently anticoagulated, history of aortic valve replacement, type 2 diabetes mellitus not currently on treatment, hyperlipidemia, hypertension, sleep apnea who is NOT vaccinated for COVID-19.  He has a family member at [facility] that is critically ill with COVID-19 at this time.  He presented to the emergency department complaining of 5 days of progressive shortness of breath, generalized weakness and diarrhea.  He has not been able to eat or drink well in the past 24 hours.  He denies nausea and vomiting.  He denies fever and chills.  He reports a nonproductive cough.  He lives with her grand daughter they have had a difficult time caring for him at home because of his acute weakness requiring more assistance with ADLs.  He reports that his pulse ox reading at home was 86% on room air.  He is not normally on oxygen.  He presented to the emergency department because he was afraid that his shortness of breath symptoms have been worsening.  ED Course: He was initally hypoxic on arrival but after lying down his pulse ox improved on room air.  It immediately desaturates with ambulation or physical activity. His chest xray was positive for viral appearing infiltrates consistent with Covid infection.  His SARS 2 coronavirus test was positive.  His D-dimer was 1.55.  His sodium was 138, potassium 5.0, glucose 158, BUN 26, creatinine 1.32, calcium 8.6, AST 52, ALT 32, WBC 6.5, hemoglobin 13.9, MCV 101.2, platelets 153.  CT angio chest pending.  Review of Systems: As per HPI otherwise 10 point  review of systems negative.   Past Medical History:  Diagnosis Date  . Chronic pulmonary embolism ([place]) 78/4696   compliction of the valve replacement surgery  . COPD (chronic obstructive pulmonary disease) ([place])   . Diabetes ([place]) 04/22/2016  . H/O aortic valve replacement 10/2008  . Hypercholesteremia   . Hypertension   . Pulmonary emboli ([place]) 10/2008  . Sleep apnea     Past Surgical History:  Procedure Laterality Date  . AORTIC VALVE REPLACEMENT     2010  . CARDIAC CATHETERIZATION  2010  . COLONOSCOPY N/A 06/05/2016   Procedure: COLONOSCOPY;  Surgeon: Rogene Houston, MD;  Location: [facility] ENDO SUITE;  Service: Endoscopy;  Laterality: N/A;  . PARTIAL COLECTOMY N/A 07/11/2016   Procedure: PARTIAL COLECTOMY;  Surgeon: Aviva Signs, MD;  Location: [facility] ORS;  Service: General;  Laterality: N/A;  . PORTACATH PLACEMENT N/A 08/13/2016   Procedure: INSERTION PORT-A-CATH LEFT SUBCLAVIAN;  Surgeon: Aviva Signs, MD;  Location: [facility] ORS;  Service: General;  Laterality: N/A;  . Scissors     from trauma     reports that he quit smoking about 57 years ago. His smoking use included cigarettes. He quit after 15.00 years of use. He has never used smokeless tobacco. He reports that he does not drink alcohol and does not use drugs.  Allergies  Allergen Reactions  . Amoxicillin Hives    Has patient had a PCN reaction causing immediate rash, facial/tongue/throat swelling, SOB or lightheadedness with  hypotension: No Has patient had a PCN reaction causing severe rash involving mucus membranes or skin necrosis: Yes Has patient had a PCN reaction that required hospitalization No Has patient had a PCN reaction occurring within the last 10 years: No If all of the above answers are "NO", then may proceed with Cephalosporin use.   . Tamsulosin Hcl     Cant sleep, confusion     Family History  Problem Relation Age of Onset  . Colon cancer Neg Hx      Prior to  Admission medications   Medication Sig Start Date End Date Taking? Authorizing Provider  atorvastatin (LIPITOR) 10 MG tablet Take 10 mg by mouth at bedtime.    Yes [provider]  Cholecalciferol (VITAMIN D3) 5000 units CAPS Take 5,000 Units by mouth daily.    Yes [provider]  lisinopril (PRINIVIL,ZESTRIL) 10 MG tablet Take 10 mg by mouth daily. 07/01/18  Yes [provider]  metoprolol (LOPRESSOR) 50 MG tablet Take 50 mg by mouth 2 (two) times daily. 03/06/16  Yes [provider]  Multiple Vitamins-Minerals (CENTRUM SILVER 50+MEN PO) Take 1 tablet by mouth daily.   Yes [provider]  vitamin B-12 (CYANOCOBALAMIN) 1000 MCG tablet Take 1,000 mcg by mouth daily.    Yes [provider]  diphenoxylate-atropine (LOMOTIL) 2.5-0.025 MG tablet Take 1 tablet by mouth 4 (four) times daily as needed for diarrhea or loose stools. Patient not taking: Reported on 09/08/2019 08/13/18   Francene Finders L, NP-C  Insulin Regular Human (NOVOLIN R FLEXPEN) 100 UNIT/ML SOPN Inject 5 Units as directed daily. If sugar more than 150 Patient not taking: Reported on 12/11/2019 03/09/19   Derek Jack, MD  lidocaine-prilocaine (EMLA) cream Apply 1 application topically as needed. Patient not taking: Reported on 09/08/2019 10/27/18   Derek Jack, MD  ondansetron (ZOFRAN ODT) 8 MG disintegrating tablet Take 1 tablet (8 mg total) by mouth every 8 (eight) hours as needed for nausea or vomiting. Patient not taking: Reported on 09/08/2019 02/25/19   Roger Shelter, FNP  RELION GLUCOSE TEST STRIPS test strip USE 1 STRIP TO CHECK GLUCOSE ONCE DAILY Patient not taking: Reported on 12/11/2019 04/22/18   [provider]  prochlorperazine (COMPAZINE) 10 MG tablet Take 1 tablet (10 mg total) by mouth every 6 (six) hours as needed (Nausea or vomiting). 08/05/16 02/11/17  Twana First, MD   Physical Exam: Vitals:   12/11/19 1241 12/11/19 1250 12/11/19 1300 12/11/19  1330  BP: 115/67  110/62 110/63  Pulse: 71  66 65  Resp: 10  (!) 24 (!) 25  Temp:  98.5 F (36.9 C)    TempSrc:  Oral    SpO2: 94%  95% 96%  Weight:      Height:       Constitutional: Elderly male lying supine on gurney, speaking in full sentences, NAD, calm, comfortable Eyes: PERRL, lids and conjunctivae normal ENMT: Mucous membranes are moist. Posterior pharynx clear of any exudate or lesions.  Neck: normal, supple, no masses, no thyromegaly Respiratory: Tachypnea, Rales heard bilaterally.  Cardiovascular: Normal S1-S2 sounds, No extremity edema. 2+ pedal pulses. No carotid bruits.  Abdomen: no tenderness, no masses palpated. No hepatosplenomegaly. Bowel sounds positive.  Musculoskeletal: no clubbing / cyanosis. No joint deformity upper and lower extremities. Good ROM, no contractures. Normal muscle tone.  Skin: no rashes, lesions, ulcers. No induration Neurologic: CN 2-12 grossly intact. Sensation intact, DTR normal. Strength 5/5 in all 4.  Psychiatric: Normal judgment and insight.  Alert and oriented x 3. Normal mood.   Labs on Admission: I have personally reviewed following labs and imaging studies  CBC: Recent Labs  Lab 12/11/19 1247  WBC 6.5  NEUTROABS 3.1  HGB 13.9  HCT 42.9  MCV 101.2*  PLT 500   Basic Metabolic Panel: Recent Labs  Lab 12/11/19 1247  NA 138  K 5.0  CL 106  CO2 23  GLUCOSE 158*  BUN 26*  CREATININE 1.32*  CALCIUM 8.6*   GFR: Estimated Creatinine Clearance: 44.9 mL/min (A) (by C-G formula based on SCr of 1.32 mg/dL (H)). Liver Function Tests: Recent Labs  Lab 12/11/19 1247  AST 52*  ALT 32  ALKPHOS 43  BILITOT 0.9  PROT 7.5  ALBUMIN 3.6   No results for input(s): LIPASE, AMYLASE in the last 168 hours. No results for input(s): AMMONIA in the last 168 hours. Coagulation Profile: No results for input(s): INR, PROTIME in the last 168 hours. Cardiac Enzymes: No results for input(s): CKTOTAL, CKMB, CKMBINDEX, TROPONINI in the last  168 hours. BNP (last 3 results) No results for input(s): PROBNP in the last 8760 hours. HbA1C: No results for input(s): HGBA1C in the last 72 hours. CBG: No results for input(s): GLUCAP in the last 168 hours. Lipid Profile: No results for input(s): CHOL, HDL, LDLCALC, TRIG, CHOLHDL, LDLDIRECT in the last 72 hours. Thyroid Function Tests: No results for input(s): TSH, T4TOTAL, FREET4, T3FREE, THYROIDAB in the last 72 hours. Anemia Panel: No results for input(s): VITAMINB12, FOLATE, FERRITIN, TIBC, IRON, RETICCTPCT in the last 72 hours. Urine analysis:    Component Value Date/Time   COLORURINE YELLOW 02/02/2019 1140   APPEARANCEUR HAZY (A) 02/02/2019 1140   LABSPEC 1.011 02/02/2019 1140   PHURINE 6.0 02/02/2019 1140   GLUCOSEU NEGATIVE 02/02/2019 1140   HGBUR NEGATIVE 02/02/2019 [place] 02/02/2019 1140   KETONESUR NEGATIVE 02/02/2019 1140   PROTEINUR NEGATIVE 02/02/2019 1140   NITRITE NEGATIVE 02/02/2019 1140   LEUKOCYTESUR TRACE (A) 02/02/2019 1140   Radiological Exams on Admission: DG Chest Portable 1 View  Result Date: 12/11/2019 CLINICAL DATA:  Dyspnea, COPD EXAM: PORTABLE CHEST 1 VIEW COMPARISON:  08/13/2016 chest radiograph. FINDINGS: Intact sternotomy wires. Left subclavian Port-A-Cath terminates over the middle third of the SVC. Stable cardiomediastinal silhouette with normal heart size. No pneumothorax. No pleural effusion. Emphysema. No acute consolidative airspace disease. Scattered reticular and faint hazy opacities throughout both lungs appear new/increased. IMPRESSION: Emphysema. Scattered reticular and faint hazy opacities throughout both lungs, new/increased. Differential includes atypical/viral infection or interstitial lung disease. Electronically Signed   By: Ilona Sorrel M.D.   On: 12/11/2019 12:40   EKG: Independently reviewed.  Normal sinus rhythm no acute ST-T wave abnormalities seen.  Assessment/Plan Principal Problem:   Pneumonia due to  COVID-19 virus Active Problems:   High cholesterol   H/O aortic valve replacement   Diabetes (HCC)   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HCC)   S/P partial colectomy   Port-A-Cath in place   Nausea and vomiting   Dehydration   Sleep apnea   COPD (chronic obstructive pulmonary disease) ([place])   DNR (do not resuscitate)   AKI (acute kidney injury) ([place])   Hyperglycemia   Generalized weakness   Hypertension   History of pulmonary embolism   1. Covid pneumonia -patient is presenting after 5 days of progressive dyspnea with findings of viral appearing infiltrate on chest x-ray.  Patient has advanced age and comorbidities that likely will complicate his disease course.  We  will be aggressive with therapy.  He is started on remdesivir dosed by pharmacy.  He will be started on IV Solu-Medrol.  Supportive therapy is started.  Supplemental vitamins started.  Bronchodilators started.  Titrate oxygen as needed.  Intermittently follow chest x-ray.  I have added some additional inflammatory markers to help follow the disease course.  I added a CRP and ferritin and will follow and trend with other markers.  He does have an elevated D-dimer and has been sent for CT angiogram.  Follow-up results. 2. AKI-prerenal, patient has had poor oral intake over the past couple of days.  I am treating with IV fluid hydration and follow BMP. 3. Type 2 diabetes mellitus-patient reports he is currently on no home treatment.  He is hyperglycemic at this time and sliding scale has been ordered.  Anticipate steroid-induced hyperglycemia and he has been started on Tradjenta in addition to low-dose basal insulin and supplemental sliding scale coverage and frequent CBG monitoring.  A1c pending.  4. Hyperlipidemia - resume home atorvastatin.  5. COPD - scheduled and as needed bronchodilators ordered.  6. DNR present on admission - after discussion at bedside patient made his wishes for DNR clear.  7. Generalized weakness -  Fall precautions ordered. PT evaluation requested.  8. History of pulmonary embolism - CTA chest pending to rule out recurrent PE. For now, DVT prophylaxis dose enoxaparin ordered.  9. Essential hypertension - resume home medication when reconciled. 10. Stage IV colon cancer - He has had extensive therapies and multiple rounds of chemotherapy. He is actively followed by Dr. Delton Coombes and last had telemedicine visit in June 2021.   DVT prophylaxis: enoxaparin   Code Status: DNR   Family Communication:   Disposition Plan: TBD  Consults called: n/a  Admission status: INP   Neda Willenbring MD [facility] How to contact the [facility] Attending or Consulting provider Erin or covering provider during after hours [place], for this patient?  1. Check the care team in [facility] and look for a) attending/consulting TRH provider listed and b) the [facility] team listed 2. Log into [email or web address] and use [place]'s universal password to access. If you do not have the password, please contact the hospital operator. 3. Locate the [facility] provider you are looking for under [facility] and page to a number that you can be directly reached. 4. If you still have difficulty reaching the provider, please page the [facility] (Director on Call) for the Hospitalists listed on amion for assistance.   If 7PM-7AM, please contact night-coverage [email or web address] Password [facility]  12/11/2019, 2:58 PM

## 2019-12-11 NOTE — ED Notes (Signed)
Pt verbalized he wants to be a DNR, he will allow for intubation , if Dr sees he has a chance to improve. Example: Covid

## 2019-12-11 NOTE — ED Notes (Signed)
SOB x 5 days and Loose stools x 5 days

## 2019-12-12 DIAGNOSIS — N179 Acute kidney failure, unspecified: Secondary | ICD-10-CM

## 2019-12-12 DIAGNOSIS — E860 Dehydration: Secondary | ICD-10-CM | POA: Diagnosis not present

## 2019-12-12 DIAGNOSIS — U071 COVID-19: Secondary | ICD-10-CM | POA: Diagnosis not present

## 2019-12-12 DIAGNOSIS — C184 Malignant neoplasm of transverse colon: Secondary | ICD-10-CM

## 2019-12-12 DIAGNOSIS — J449 Chronic obstructive pulmonary disease, unspecified: Secondary | ICD-10-CM | POA: Diagnosis not present

## 2019-12-12 DIAGNOSIS — J1282 Pneumonia due to coronavirus disease 2019: Secondary | ICD-10-CM

## 2019-12-12 DIAGNOSIS — Z66 Do not resuscitate: Secondary | ICD-10-CM

## 2019-12-12 LAB — CBC WITH DIFFERENTIAL/PLATELET
Abs Immature Granulocytes: 0.01 10*3/uL (ref 0.00–0.07)
Basophils Absolute: 0 10*3/uL (ref 0.0–0.1)
Basophils Relative: 0 %
Eosinophils Absolute: 0 10*3/uL (ref 0.0–0.5)
Eosinophils Relative: 0 %
HCT: 39 % (ref 39.0–52.0)
Hemoglobin: 12.7 g/dL — ABNORMAL LOW (ref 13.0–17.0)
Immature Granulocytes: 0 %
Lymphocytes Relative: 36 %
Lymphs Abs: 1.5 10*3/uL (ref 0.7–4.0)
MCH: 32.2 pg (ref 26.0–34.0)
MCHC: 32.6 g/dL (ref 30.0–36.0)
MCV: 99 fL (ref 80.0–100.0)
Monocytes Absolute: 0.6 10*3/uL (ref 0.1–1.0)
Monocytes Relative: 14 %
Neutro Abs: 2.1 10*3/uL (ref 1.7–7.7)
Neutrophils Relative %: 50 %
Platelets: 154 10*3/uL (ref 150–400)
RBC: 3.94 MIL/uL — ABNORMAL LOW (ref 4.22–5.81)
RDW: 12.5 % (ref 11.5–15.5)
WBC: 4.2 10*3/uL (ref 4.0–10.5)
nRBC: 0 % (ref 0.0–0.2)

## 2019-12-12 LAB — D-DIMER, QUANTITATIVE: D-Dimer, Quant: 1.19 ug/mL-FEU — ABNORMAL HIGH (ref 0.00–0.50)

## 2019-12-12 LAB — CBG MONITORING, ED
Glucose-Capillary: 285 mg/dL — ABNORMAL HIGH (ref 70–99)
Glucose-Capillary: 224 mg/dL — ABNORMAL HIGH (ref 70–99)
Glucose-Capillary: 193 mg/dL — ABNORMAL HIGH (ref 70–99)

## 2019-12-12 LAB — FERRITIN: Ferritin: 1368 ng/mL — ABNORMAL HIGH (ref 24–336)

## 2019-12-12 LAB — COMPREHENSIVE METABOLIC PANEL
ALT: 25 U/L (ref 0–44)
AST: 41 U/L (ref 15–41)
Albumin: 2.9 g/dL — ABNORMAL LOW (ref 3.5–5.0)
Alkaline Phosphatase: 36 U/L — ABNORMAL LOW (ref 38–126)
Anion gap: 7 (ref 5–15)
BUN: 21 mg/dL (ref 8–23)
CO2: 17 mmol/L — ABNORMAL LOW (ref 22–32)
Calcium: 8.2 mg/dL — ABNORMAL LOW (ref 8.9–10.3)
Chloride: 111 mmol/L (ref 98–111)
Creatinine, Ser: 1.01 mg/dL (ref 0.61–1.24)
GFR calc Af Amer: >60 mL/min (ref >60)
GFR calc non Af Amer: >60 mL/min (ref >60)
Glucose, Bld: 232 mg/dL — ABNORMAL HIGH (ref 70–99)
Potassium: 4.4 mmol/L (ref 3.5–5.1)
Sodium: 135 mmol/L (ref 135–145)
Total Bilirubin: 0.6 mg/dL (ref 0.3–1.2)
Total Protein: 6.4 g/dL — ABNORMAL LOW (ref 6.5–8.1)

## 2019-12-12 LAB — PHOSPHORUS: Phosphorus: 2.4 mg/dL — ABNORMAL LOW (ref 2.5–4.6)

## 2019-12-12 LAB — C-REACTIVE PROTEIN: CRP: 2.4 mg/dL — ABNORMAL HIGH (ref <1.0)

## 2019-12-12 LAB — MAGNESIUM: Magnesium: 1.9 mg/dL (ref 1.7–2.4)

## 2019-12-12 MED ORDER — LINAGLIPTIN 5 MG PO TABS
5.0000 mg | ORAL_TABLET | Freq: Every day | ORAL | 0 refills | Status: DC
Start: 2019-12-13 — End: 2020-06-01

## 2019-12-12 MED ORDER — ALBUTEROL SULFATE HFA 108 (90 BASE) MCG/ACT IN AERS: 2.0000 | INHALATION_SPRAY | RESPIRATORY_TRACT | 0 refills | Status: DC | PRN

## 2019-12-12 MED ORDER — PREDNISONE 20 MG PO TABS
40.0000 mg | ORAL_TABLET | Freq: Every day | ORAL | 0 refills | Status: DC
Start: 2019-12-14 — End: 2020-06-01

## 2019-12-12 MED ORDER — GUAIFENESIN-DM 100-10 MG/5ML PO SYRP: 10.0000 mL | ORAL_SOLUTION | ORAL | 0 refills | Status: DC | PRN

## 2019-12-12 MED ORDER — NOVOLIN R FLEXPEN 100 UNIT/ML IJ SOPN: 5.0000 [IU] | PEN_INJECTOR | Freq: Three times a day (TID) | INTRAMUSCULAR | 0 refills | Status: AC

## 2019-12-12 NOTE — ED Notes (Signed)
Patient ambulated on room air, oxygen saturation drop to 84%.

## 2019-12-12 NOTE — TOC Transition Note (Signed)
Transition of Care [facility]) - CM/SW Discharge Note   Patient Details  Name: Kevin Kirk MRN: [id number] Date of Birth: 01/25/1933  Transition of Care [facility]) CM/SW Contact:  Salome Arnt, LCSW Phone Number: 12/12/2019, 2:13 PM   Clinical Narrative:  Pt admitted for pneumonia due to COVID 19. He reports he lives with his 2 granddaughters. He states, "They look after me real well." Pt states he is fairly independent and still drives. PT evaluated pt and no follow up needed. Pt d/c today. He will require home O2. Discussed with pt who requests referral to [place] as he has cpap through [place]. LCSW spoke to [place] with [employer] who accepts and sent to [facility]. He reports driver will bring portable tank to ED for transport and provided number [phone]) for family to call when leaving ED for them to meet at home to complete home set up. Sat qualification note and orders in. Pt will be given [employer] number with d/c paperwork. No other needs reported.     Final next level of care: Home/Self Care Barriers to Discharge: Barriers Resolved   Patient Goals and CMS Choice Patient states their goals for this hospitalization and ongoing recovery are:: return home      Discharge Placement                  Name of family member notified: pt only- he has contacted granddaughter Patient and family notified of of transfer: 12/12/19  Discharge Plan and Services                DME Arranged: Oxygen DME Agency: [facility] Date DME Agency Contacted: 12/12/19 Time DME Agency Contacted: [phone] Representative spoke with at DME Agency: [place]            Social Determinants of Health ([place]) Interventions     Readmission Risk Interventions Readmission Risk Prevention Plan 12/12/2019  Transportation Screening Complete  HRI or [place] Complete  Social Work Consult for [place] Planning/Counseling [place] Screening Not Applicable   Medication Review Press photographer) Complete  Some recent data might be hidden

## 2019-12-12 NOTE — Progress Notes (Signed)
Patient scheduled for outpatient Remdesivir infusions at 11am on Tuesday 9/21, Wednesday 9/22, and Thursday 9/23 at [facility]. Please inform the patient to park at [place], as staff will be escorting the patient through the [place] entrance of the hospital. Appointments take approximately 45 minutes.    There is a wave flag banner located near the entrance on [address]. Turn into this entrance and immediately turn left and park in 1 of the 5 designated Covid Infusion Parking spots. There is a phone number on the sign, please call and let the staff know what spot you are in and we will come out and get you. For questions call [phone].  Thanks.

## 2019-12-12 NOTE — ED Notes (Signed)
SATURATION QUALIFICATIONS: (This note is used to comply with regulatory documentation for home oxygen)  Patient Saturations on Room Air at Rest = 91%  Patient Saturations on Room Air while Ambulating = 84%  Patient Saturations on 2 Liters of oxygen while Ambulating = 80%  Please briefly explain why patient needs home oxygen:  Pt has increased work of breathing with ambulating, pt dropped to 80% when walking on O2, but then climbed to 86% when back in bed and slowly back into the 90s on 2L

## 2019-12-12 NOTE — Progress Notes (Signed)
Inpatient Diabetes Program Recommendations  AACE/ADA: New Consensus Statement on Inpatient Glycemic Control (2015)  Target Ranges:  Prepandial:   less than 140 mg/dL      Peak postprandial:   less than 180 mg/dL (1-2 hours)      Critically ill patients:  140 - 180 mg/dL   Lab Results  Component Value Date   GLUCAP 224 (H) 12/12/2019   HGBA1C 7.1 (H) 12/11/2019    Review of Glycemic Control Results for Kevin Kirk, Kevin Kirk (MRN [id number]) as of 12/12/2019 11:37  Ref. Range 12/11/2019 16:43 12/12/2019 01:08 12/12/2019 08:48  Glucose-Capillary Latest Ref Range: 70 - 99 mg/dL 104 (H) 285 (H) 224 (H)   Diabetes history:  DM2 Outpatient Diabetes medications:  none Current orders for Inpatient glycemic control:  tradjenta 5 mg daily Novolog 0-15 units tid with meals Novolog 0-5 units qhs Novolog 2 units tid with meals Levemir 6 units bid Solumedrol 40 mg q12h  Inpatient Diabetes Program Recommendations:     levemir 8 units bid  Will continue to follow while inpatient.  Thank you, Reche Dixon, RN, BSN Diabetes Coordinator Inpatient Diabetes Program [phone] (team pager from 8a-5p)

## 2019-12-12 NOTE — ED Notes (Signed)
Pt in bed, pt satting 95% on 2L, got pt up and walked around room on 2L, pt desatting down to 80% and has increased wob, pt back into bed and quickly recovered to 86% and then slowly recovered to 94% on 2L.  Pt denies sob and states that he felt ok walking.

## 2019-12-12 NOTE — Care Management Obs Status (Signed)
[place] NOTIFICATION   Patient Details  Name: Kevin Kirk MRN: [id number] Date of Birth: Dec 21, 1932   Medicare Observation Status Notification Given:  Yes    Salome Arnt, [place] 12/12/2019, 2:02 PM

## 2019-12-12 NOTE — Care Management CC44 (Signed)
Condition Code 44 Documentation Completed  Patient Details  Name: RYER ASATO MRN: [id number] Date of Birth: 03/04/1933   Condition Code 44 given:  Yes Patient signature on Condition Code 44 notice:  Yes (verbal) Documentation of 2 MD's agreement:  Yes Code 44 added to claim:  Yes    Salome Arnt, [place] 12/12/2019, 2:03 PM

## 2019-12-12 NOTE — Discharge Summary (Signed)
Physician Discharge Summary  ENRIQUE MANGANARO IRW:[id number] DOB: 1932/06/02 DOA: 12/11/2019  PCP: Tobe Sos, MD  Admit date: 12/11/2019 Discharge date: 12/12/2019  Admitted From: Home Disposition: Home  Recommendations for Outpatient Follow-up:  1. Follow up with PCP in 1-2 weeks 2. Please obtain BMP/CBC in one week 3. Patient has been set up for outpatient remdesivir to complete total 5-day course 4. Patient will need to quarantine for 21 days  Home Health: Equipment/Devices: Oxygen at 3 L  Discharge Condition: Stable CODE STATUS: DNR Diet recommendation: Heart healthy  Brief/Interim Summary: Kevin Kirk is a 86 y.o. male with medical history significant for stage IV colon cancer, COPD, remote pulmonary emboli not currently anticoagulated, history of aortic valve replacement, type 2 diabetes mellitus not currently on treatment, hyperlipidemia, hypertension, sleep apnea who is NOT vaccinated for COVID-19.  He has a family member at [facility] that is critically ill with COVID-19 at this time.  He presented to the emergency department complaining of 5 days of progressive shortness of breath, generalized weakness and diarrhea.  He has not been able to eat or drink well in the past 24 hours.  He denies nausea and vomiting.  He denies fever and chills.  He reports a nonproductive cough.  He lives with her grand daughter they have had a difficult time caring for him at home because of his acute weakness requiring more assistance with ADLs.  He reports that his pulse ox reading at home was 86% on room air.  He is not normally on oxygen.  He presented to the emergency department because he was afraid that his shortness of breath symptoms have been worsening.  ED Course: He was initally hypoxic on arrival but after lying down his pulse ox improved on room air.  It immediately desaturates with ambulation or physical activity. His chest xray was positive for viral appearing infiltrates  consistent with Covid infection.  His SARS 2 coronavirus test was positive.  His D-dimer was 1.55.  His sodium was 138, potassium 5.0, glucose 158, BUN 26, creatinine 1.32, calcium 8.6, AST 52, ALT 32, WBC 6.5, hemoglobin 13.9, MCV 101.2, platelets 153.  CT angio chest pending.  Discharge Diagnoses:  Principal Problem:   Pneumonia due to COVID-19 virus Active Problems:   High cholesterol   H/O aortic valve replacement   Diabetes ([place])   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HCC)   S/P partial colectomy   Port-A-Cath in place   Nausea and vomiting   Dehydration   Sleep apnea   COPD (chronic obstructive pulmonary disease) ([place])   DNR (do not resuscitate)   AKI (acute kidney injury) ([place])   Hyperglycemia   Generalized weakness   Hypertension   History of pulmonary embolism  1. Covid pneumonia -patient is presenting after 5 days of progressive dyspnea with findings of viral appearing infiltrate on chest x-ray.  Patient has advanced age and comorbidities that likely will complicate his disease course.  Patient was treated with steroids and remdesivir.  Overall respiratory status has stabilized.  Inflammatory markers are trending down.  He has been set up for outpatient doses of remdesivir to complete 5-day course.  We will also complete 10 days of steroids.  He has been encouraged to lay in prone position and continue incentive spirometry.  2. AKI-prerenal, patient has had poor oral intake over the past couple of days.  Improved with IV fluids 3. Type 2 diabetes mellitus-patient reports he is currently on no home treatment.  Continue on regular insulin on discharge.  He is also been started on Tradjenta.  Continue CBG monitoring.  A1c 7.1.  4. Hyperlipidemia - resume home atorvastatin.  5. COPD - scheduled and as needed bronchodilators ordered.  6. DNR present on admission - after discussion at bedside patient made his wishes for DNR clear.  7. Generalized weakness - Fall precautions  ordered.  Seen by physical therapy and felt to be at baseline with no further therapy recommended 8. History of pulmonary embolism -CTA chest was negative for pulmonary embolism 9. Stage IV colon cancer - He has had extensive therapies and multiple rounds of chemotherapy. He is actively followed by Dr. Delton Coombes and last had telemedicine visit in June 2021.   Discharge Instructions  Discharge Instructions    Diet - low sodium heart healthy   Complete by: As directed    Increase activity slowly   Complete by: As directed      Allergies as of 12/12/2019      Reactions   Amoxicillin Hives   Has patient had a PCN reaction causing immediate rash, facial/tongue/throat swelling, SOB or lightheadedness with hypotension: No Has patient had a PCN reaction causing severe rash involving mucus membranes or skin necrosis: Yes Has patient had a PCN reaction that required hospitalization No Has patient had a PCN reaction occurring within the last 10 years: No If all of the above answers are "NO", then may proceed with Cephalosporin use.   Tamsulosin Hcl    Cant sleep, confusion       Medication List    STOP taking these medications   diphenoxylate-atropine 2.5-0.025 MG tablet Commonly known as: LOMOTIL   lidocaine-prilocaine cream Commonly known as: EMLA   lisinopril 10 MG tablet Commonly known as: ZESTRIL   ondansetron 8 MG disintegrating tablet Commonly known as: Zofran ODT     TAKE these medications   albuterol 108 (90 Base) MCG/ACT inhaler Commonly known as: VENTOLIN HFA Inhale 2 puffs into the lungs every 4 (four) hours as needed for wheezing or shortness of breath.   atorvastatin 10 MG tablet Commonly known as: LIPITOR Take 10 mg by mouth at bedtime.   CENTRUM SILVER 50+MEN PO Take 1 tablet by mouth daily.   guaiFENesin-dextromethorphan 100-10 MG/5ML syrup Commonly known as: ROBITUSSIN DM Take 10 mLs by mouth every 4 (four) hours as needed for cough.   linagliptin 5 MG  Tabs tablet Commonly known as: TRADJENTA Take 1 tablet (5 mg total) by mouth daily. Start taking on: December 13, 2019   metoprolol tartrate 50 MG tablet Commonly known as: LOPRESSOR Take 50 mg by mouth 2 (two) times daily.   NovoLIN R FlexPen 100 UNIT/ML Sopn Generic drug: Insulin Regular Human Inject 5 Units as directed with breakfast, with lunch, and with evening meal. If sugar more than 150 What changed: when to take this   predniSONE 20 MG tablet Commonly known as: DELTASONE Take 2 tablets (40 mg total) by mouth daily. Start taking on: December 14, 2019   RELION GLUCOSE TEST STRIPS test strip Generic drug: glucose blood USE 1 STRIP TO CHECK GLUCOSE ONCE DAILY   vitamin B-12 1000 MCG tablet Commonly known as: CYANOCOBALAMIN Take 1,000 mcg by mouth daily.   Vitamin D3 125 MCG (5000 UT) Caps Take 5,000 Units by mouth daily.            Durable Medical Equipment  (From admission, onward)         Start     Ordered  12/12/19 1226  For home use only DME oxygen  Once       Question Answer Comment  Length of Need 6 Months   Mode or (Route) Nasal cannula   Liters per Minute 3   Frequency Continuous (stationary and portable oxygen unit needed)   Oxygen conserving device Yes   Oxygen delivery system Gas      12/12/19 [place] Follow up.   Why: Oxygen Contact information: [place] [address] [phone]              Allergies  Allergen Reactions  . Amoxicillin Hives    Has patient had a PCN reaction causing immediate rash, facial/tongue/throat swelling, SOB or lightheadedness with hypotension: No Has patient had a PCN reaction causing severe rash involving mucus membranes or skin necrosis: Yes Has patient had a PCN reaction that required hospitalization No Has patient had a PCN reaction occurring within the last 10 years: No If all of the above answers are "NO", then may proceed  with Cephalosporin use.   . Tamsulosin Hcl     Cant sleep, confusion     Consultations:     Procedures/Studies: CT Angio Chest PE W and/or Wo Contrast  Result Date: 12/11/2019 CLINICAL DATA:  PE suspected, positive D-dimer, dyspnea, metastatic colon cancer EXAM: CT ANGIOGRAPHY CHEST WITH CONTRAST TECHNIQUE: Multidetector CT imaging of the chest was performed using the standard protocol during bolus administration of intravenous contrast. Multiplanar CT image reconstructions and MIPs were obtained to evaluate the vascular anatomy. CONTRAST:  48mL OMNIPAQUE IOHEXOL 350 MG/ML SOLN COMPARISON:  CT chest, 09/02/2019 FINDINGS: Cardiovascular: Satisfactory opacification of the pulmonary arteries to the segmental level. No evidence of pulmonary embolism. Normal heart size. No pericardial effusion. Aortic atherosclerosis. Unchanged enlargement of the tubular ascending thoracic aorta, measuring up to 4.6 x 4.5 cm. Left chest port catheter. Mediastinum/Nodes: Unchanged prominent subcarinal lymph node measuring 2.4 x 1.5 cm (series 4, image 54). Thyroid gland, trachea, and esophagus demonstrate no significant findings. Lungs/Pleura: There are peripheral, somewhat geographic ground-glass airspace opacities, most conspicuous at the lung bases. No pleural effusion or pneumothorax. Upper Abdomen: No acute abnormality. Musculoskeletal: No chest wall abnormality. Ankylosis of the thoracic spine. Review of the MIP images confirms the above findings. IMPRESSION: 1. Negative examination for pulmonary embolism. 2. There are peripheral, somewhat geographic ground-glass airspace opacities, most conspicuous at the lung bases, consistent with multifocal infection or inflammation, possibly subacute given appearance, differential considerations particularly including COVID airspace disease although alternate etiology such as drug toxicity are differential considerations. 3. Unchanged prominent subcarinal lymph node. Attention on  follow-up. 4. Unchanged enlargement of the tubular ascending thoracic aorta, measuring up to 4.6 x 4.5 cm. Ascending thoracic aortic aneurysm. Recommend semi-annual imaging followup by CTA or MRA and referral to cardiothoracic surgery if not already obtained. This recommendation follows 2010 ACCF/AHA/AATS/ACR/ASA/SCA/SCAI/SIR/STS/SVM Guidelines for the Diagnosis and Management of Patients With Thoracic Aortic Disease. Circulation. 2010; 121: B147-W295. Aortic aneurysm NOS (ICD10-I71.9) Aortic Atherosclerosis (ICD10-I70.0). Electronically Signed   By: Eddie Candle M.D.   On: 12/11/2019 15:46   DG Chest Portable 1 View  Result Date: 12/11/2019 CLINICAL DATA:  Dyspnea, COPD EXAM: PORTABLE CHEST 1 VIEW COMPARISON:  08/13/2016 chest radiograph. FINDINGS: Intact sternotomy wires. Left subclavian Port-A-Cath terminates over the middle third of the SVC. Stable cardiomediastinal silhouette with normal heart size. No pneumothorax. No pleural effusion. Emphysema. No acute consolidative airspace  disease. Scattered reticular and faint hazy opacities throughout both lungs appear new/increased. IMPRESSION: Emphysema. Scattered reticular and faint hazy opacities throughout both lungs, new/increased. Differential includes atypical/viral infection or interstitial lung disease. Electronically Signed   By: Ilona Sorrel M.D.   On: 12/11/2019 12:40       Subjective: Patient is feeling better.  No significant cough.  Able to ambulate without difficulty.  Discharge Exam: Vitals:   12/12/19 0850 12/12/19 1200 12/12/19 1342 12/12/19 1451  BP: 124/70 132/66  125/68  Pulse: 88 84  94  Resp: 20 (!) 24  (!) 25  Temp:  97.7 F (36.5 C)  98.1 F (36.7 C)  TempSrc:  Oral  Oral  SpO2: 91% 95% 92% 94%  Weight:      Height:        General: Pt is alert, awake, not in acute distress Cardiovascular: RRR, S1/S2 +, no rubs, no gallops Respiratory: CTA bilaterally, no wheezing, no rhonchi Abdominal: Soft, NT, ND, bowel sounds  + Extremities: no edema, no cyanosis    The results of significant diagnostics from this hospitalization (including imaging, microbiology, ancillary and laboratory) are listed below for reference.     Microbiology: Recent Results (from the past 240 hour(s))  SARS Coronavirus 2 by RT PCR (hospital order, performed in [facility] hospital lab) Nasopharyngeal Nasopharyngeal Swab     Status: Abnormal   Collection Time: 12/11/19 12:19 PM   Specimen: Nasopharyngeal Swab  Result Value Ref Range Status   SARS Coronavirus 2 POSITIVE (A) NEGATIVE Final    Comment: RESULT CALLED TO, READ BACK BY AND VERIFIED WITH: LINDA LONG,RN @1413  12/11/2019 KAY (NOTE) SARS-CoV-2 target nucleic acids are DETECTED  SARS-CoV-2 RNA is generally detectable in upper respiratory specimens  during the acute phase of infection.  Positive results are indicative  of the presence of the identified virus, but do not rule out bacterial infection or co-infection with other pathogens not detected by the test.  Clinical correlation with patient history and  other diagnostic information is necessary to determine patient infection status.  The expected result is negative.  Fact Sheet for Patients:   StrictlyIdeas.no   Fact Sheet for Healthcare Providers:   BankingDealers.co.za    This test is not yet approved or cleared by the [place] FDA and  has been authorized for detection and/or diagnosis of SARS-CoV-2 by FDA under an Emergency Use Authorization (EUA).  This EUA will remain in effect (meaning this te st can be used) for the duration of  the COVID-19 declaration under Section 564(b)(1) of the Act, 21 U.S.C. section 360-bbb-3(b)(1), unless the authorization is terminated or revoked sooner.  Performed at [facility], [address], [place], [place] [address]      Labs: BNP (last 3 results) Recent Labs    12/11/19 1249  BNP 78.2   Basic Metabolic  Panel: Recent Labs  Lab 12/11/19 1247 12/12/19 0519  NA 138 135  K 5.0 4.4  CL 106 111  CO2 23 17*  GLUCOSE 158* 232*  BUN 26* 21  CREATININE 1.32* 1.01  CALCIUM 8.6* 8.2*  MG  --  1.9  PHOS  --  2.4*   Liver Function Tests: Recent Labs  Lab 12/11/19 1247 12/12/19 0519  AST 52* 41  ALT 32 25  ALKPHOS 43 36*  BILITOT 0.9 0.6  PROT 7.5 6.4*  ALBUMIN 3.6 2.9*   No results for input(s): LIPASE, AMYLASE in the last 168 hours. No results for input(s): AMMONIA in the last 168  hours. CBC: Recent Labs  Lab 12/11/19 1247 12/12/19 0519  WBC 6.5 4.2  NEUTROABS 3.1 2.1  HGB 13.9 12.7*  HCT 42.9 39.0  MCV 101.2* 99.0  PLT 153 154   Cardiac Enzymes: No results for input(s): CKTOTAL, CKMB, CKMBINDEX, TROPONINI in the last 168 hours. BNP: Invalid input(s): POCBNP CBG: Recent Labs  Lab 12/11/19 1643 12/12/19 0108 12/12/19 0848 12/12/19 1137  GLUCAP 104* 285* 224* 193*   D-Dimer Recent Labs    12/11/19 1249 12/12/19 0519  DDIMER 1.55* 1.19*   Hgb A1c Recent Labs    12/11/19 1247  HGBA1C 7.1*   Lipid Profile No results for input(s): CHOL, HDL, LDLCALC, TRIG, CHOLHDL, LDLDIRECT in the last 72 hours. Thyroid function studies No results for input(s): TSH, T4TOTAL, T3FREE, THYROIDAB in the last 72 hours.  Invalid input(s): FREET3 Anemia work up Recent Labs    12/11/19 1247 12/12/19 0519  FERRITIN 1,897* 1,368*   Urinalysis    Component Value Date/Time   COLORURINE YELLOW 02/02/2019 1140   APPEARANCEUR HAZY (A) 02/02/2019 1140   LABSPEC 1.011 02/02/2019 1140   PHURINE 6.0 02/02/2019 1140   GLUCOSEU NEGATIVE 02/02/2019 1140   HGBUR NEGATIVE 02/02/2019 [place] 02/02/2019 1140   KETONESUR NEGATIVE 02/02/2019 1140   PROTEINUR NEGATIVE 02/02/2019 1140   NITRITE NEGATIVE 02/02/2019 1140   LEUKOCYTESUR TRACE (A) 02/02/2019 1140   Sepsis Labs Invalid input(s): PROCALCITONIN,  WBC,  LACTICIDVEN Microbiology Recent Results (from the  past 240 hour(s))  SARS Coronavirus 2 by RT PCR (hospital order, performed in [facility] hospital lab) Nasopharyngeal Nasopharyngeal Swab     Status: Abnormal   Collection Time: 12/11/19 12:19 PM   Specimen: Nasopharyngeal Swab  Result Value Ref Range Status   SARS Coronavirus 2 POSITIVE (A) NEGATIVE Final    Comment: RESULT CALLED TO, READ BACK BY AND VERIFIED WITH: LINDA LONG,RN @1413  12/11/2019 KAY (NOTE) SARS-CoV-2 target nucleic acids are DETECTED  SARS-CoV-2 RNA is generally detectable in upper respiratory specimens  during the acute phase of infection.  Positive results are indicative  of the presence of the identified virus, but do not rule out bacterial infection or co-infection with other pathogens not detected by the test.  Clinical correlation with patient history and  other diagnostic information is necessary to determine patient infection status.  The expected result is negative.  Fact Sheet for Patients:   StrictlyIdeas.no   Fact Sheet for Healthcare Providers:   BankingDealers.co.za    This test is not yet approved or cleared by the [place] FDA and  has been authorized for detection and/or diagnosis of SARS-CoV-2 by FDA under an Emergency Use Authorization (EUA).  This EUA will remain in effect (meaning this te st can be used) for the duration of  the COVID-19 declaration under Section 564(b)(1) of the Act, 21 U.S.C. section 360-bbb-3(b)(1), unless the authorization is terminated or revoked sooner.  Performed at [facility], [address], [place], [place] [address]      Time coordinating discharge: 67mins  SIGNED:   Kathie Dike, MD  [facility] 12/12/2019, 7:12 PM   If 7PM-7AM, please contact night-coverage [email or web address]

## 2019-12-12 NOTE — Evaluation (Signed)
Physical Therapy Evaluation Patient Details Name: Kevin Kirk MRN: [id number] DOB: 1932-10-16 Today's Date: 12/12/2019   History of Present Illness   Kevin Kirk is a 86 y.o. male with medical history significant for stage IV colon cancer, COPD, remote pulmonary emboli not currently anticoagulated, history of aortic valve replacement, type 2 diabetes mellitus not currently on treatment, hyperlipidemia, hypertension, sleep apnea who is NOT vaccinated for COVID-19.  He has a family member at [facility] that is critically ill with COVID-19 at this time.  He presented to the emergency department complaining of 5 days of progressive shortness of breath, generalized weakness and diarrhea.  He has not been able to eat or drink well in the past 24 hours.  He denies nausea and vomiting.  He denies fever and chills.  He reports a nonproductive cough.  He lives with her grand daughter they have had a difficult time caring for him at home because of his acute weakness requiring more assistance with ADLs.  He reports that his pulse ox reading at home was 86% on room air.  He is not normally on oxygen.  He presented to the emergency department because he was afraid that his shortness of breath symptoms have been worsening.    Clinical Impression  Patient functioning near baseline for functional mobility and gait, normally ambulates with cane, able to walk safely in room leaning on bed rail or nearby objects for support without loss of balance, on room air with SpO2 dropping from 91% to 84% while ambulating in room, limited secondary to fatigue and tolerated sitting up at bedside after therapy.  Plan:  Patient discharged from physical therapy to care of nursing for ambulation daily as tolerated for length of stay.     Follow Up Recommendations Supervision - Intermittent;Supervision for mobility/OOB    Equipment Recommendations  None recommended by PT    Recommendations for Other Services        Precautions / Restrictions Precautions Precautions: Fall Restrictions Weight Bearing Restrictions: No      Mobility  Bed Mobility Overal bed mobility: Modified Independent                Transfers Overall transfer level: Modified independent Equipment used: None             General transfer comment: has to lean on bed rails for support  Ambulation/Gait Ambulation/Gait assistance: Modified independent (Device/Increase time);Supervision Gait Distance (Feet): 40 Feet Assistive device: 1 person hand held assist Gait Pattern/deviations: Decreased step length - left;Decreased stance time - right;Decreased stride length Gait velocity: decreased   General Gait Details: slightly labored cadence having to lean on bed rail or hand held assistance  Stairs            Wheelchair Mobility    Modified Rankin (Stroke Patients Only)       Balance Overall balance assessment: Needs assistance Sitting-balance support: No upper extremity supported;Feet unsupported Sitting balance-Leahy Scale: Good Sitting balance - Comments: seated at EOB   Standing balance support: Single extremity supported;During functional activity Standing balance-Leahy Scale: Fair Standing balance comment: leaning on bed rail or hand held assist                             Pertinent Vitals/Pain Pain Assessment: No/denies pain    Home Living Family/patient expects to be discharged to:: Private residence Living Arrangements: Other relatives Available Help at Discharge: Family;Available 24 hours/day Type of Home: House  Home Access: Stairs to enter;Ramped entrance Entrance Stairs-Rails: Right;Left;Can reach both Entrance Stairs-Number of Steps: 3 Home Layout: One level Home Equipment: Walker - 2 wheels;Cane - single point;Bedside commode;Wheelchair - manual      Prior Function Level of Independence: Needs assistance   Gait / Transfers Assistance Needed: Household ambulator  using [facility]  ADL's / Homemaking Assistance Needed: assisted by family        Hand Dominance        Extremity/Trunk Assessment   Upper Extremity Assessment Upper Extremity Assessment: Overall WFL for tasks assessed    Lower Extremity Assessment Lower Extremity Assessment: Overall WFL for tasks assessed    Cervical / Trunk Assessment Cervical / Trunk Assessment: Normal  Communication   Communication: No difficulties  Cognition Arousal/Alertness: Awake/alert Behavior During Therapy: WFL for tasks assessed/performed Overall Cognitive Status: Within Functional Limits for tasks assessed                                        General Comments      Exercises     Assessment/Plan    PT Assessment Patent does not need any further PT services  PT Problem List         PT Treatment Interventions      PT Goals (Current goals can be found in the Care Plan section)  Acute Rehab PT Goals Patient Stated Goal: return home with family to assist PT Goal Formulation: With patient Time For Goal Achievement: 12/12/19 Potential to Achieve Goals: Good    Frequency     Barriers to discharge        Co-evaluation               AM-PAC PT "6 Clicks" Mobility  Outcome Measure Help needed turning from your back to your side while in a flat bed without using bedrails?: None Help needed moving from lying on your back to sitting on the side of a flat bed without using bedrails?: None Help needed moving to and from a bed to a chair (including a wheelchair)?: None Help needed standing up from a chair using your arms (e.g., wheelchair or bedside chair)?: None Help needed to walk in hospital room?: A Little Help needed climbing 3-5 steps with a railing? : A Little 6 Click Score: 22    End of Session   Activity Tolerance: Patient tolerated treatment well;Patient limited by fatigue Patient left: in bed;with call bell/phone within reach Nurse Communication: Mobility  status PT Visit Diagnosis: Unsteadiness on feet (R26.81);Other abnormalities of gait and mobility (R26.89);Muscle weakness (generalized) (M62.81)    Time: 1040-1108 PT Time Calculation (min) (ACUTE ONLY): 28 min   Charges:   PT Evaluation $PT Eval Moderate Complexity: 1 Mod PT Treatments $Therapeutic Activity: 23-37 mins        1:49 PM, 12/12/19 Lonell Grandchild, MPT Physical Therapist with [facility] 336 [phone] office [phone] mobile phone

## 2019-12-12 NOTE — Discharge Instructions (Signed)
You will have to quarantine until 01/01/2020    COVID-19 COVID-19 is a respiratory infection that is caused by a virus called severe acute respiratory syndrome coronavirus 2 (SARS-CoV-2). The disease is also known as coronavirus disease or novel coronavirus. In some people, the virus may not cause any symptoms. In others, it may cause a serious infection. The infection can get worse quickly and can lead to complications, such as:  Pneumonia, or infection of the lungs.  Acute respiratory distress syndrome or ARDS. This is a condition in which fluid build-up in the lungs prevents the lungs from filling with air and passing oxygen into the blood.  Acute respiratory failure. This is a condition in which there is not enough oxygen passing from the lungs to the body or when carbon dioxide is not passing from the lungs out of the body.  Sepsis or septic shock. This is a serious bodily reaction to an infection.  Blood clotting problems.  Secondary infections due to bacteria or fungus.  Organ failure. This is when your body's organs stop working. The virus that causes COVID-19 is contagious. This means that it can spread from person to person through droplets from coughs and sneezes (respiratory secretions). What are the causes? This illness is caused by a virus. You may catch the virus by:  Breathing in droplets from an infected person. Droplets can be spread by a person breathing, speaking, singing, coughing, or sneezing.  Touching something, like a table or a doorknob, that was exposed to the virus (contaminated) and then touching your mouth, nose, or eyes. What increases the risk? Risk for infection You are more likely to be infected with this virus if you:  Are within 6 feet (2 meters) of a person with COVID-19.  Provide care for or live with a person who is infected with COVID-19.  Spend time in crowded indoor spaces or live in shared housing. Risk for serious illness You are more  likely to become seriously ill from the virus if you:  Are 50 years of age or older. The higher your age, the more you are at risk for serious illness.  Live in a nursing home or long-term care facility.  Have cancer.  Have a long-term (chronic) disease such as: ? Chronic lung disease, including chronic obstructive pulmonary disease or asthma. ? A long-term disease that lowers your body's ability to fight infection (immunocompromised). ? Heart disease, including heart failure, a condition in which the arteries that lead to the heart become narrow or blocked (coronary artery disease), a disease which makes the heart muscle thick, weak, or stiff (cardiomyopathy). ? Diabetes. ? Chronic kidney disease. ? Sickle cell disease, a condition in which red blood cells have an abnormal "sickle" shape. ? Liver disease.  Are obese. What are the signs or symptoms? Symptoms of this condition can range from mild to severe. Symptoms may appear any time from 2 to 14 days after being exposed to the virus. They include:  A fever or chills.  A cough.  Difficulty breathing.  Headaches, body aches, or muscle aches.  Runny or stuffy (congested) nose.  A sore throat.  New loss of taste or smell. Some people may also have stomach problems, such as nausea, vomiting, or diarrhea. Other people may not have any symptoms of COVID-19. How is this diagnosed? This condition may be diagnosed based on:  Your signs and symptoms, especially if: ? You live in an area with a COVID-19 outbreak. ? You recently traveled to or  from an area where the virus is common. ? You provide care for or live with a person who was diagnosed with COVID-19. ? You were exposed to a person who was diagnosed with COVID-19.  A physical exam.  Lab tests, which may include: ? Taking a sample of fluid from the back of your nose and throat (nasopharyngeal fluid), your nose, or your throat using a swab. ? A sample of mucus from your  lungs (sputum). ? Blood tests.  Imaging tests, which may include, X-rays, CT scan, or ultrasound. How is this treated? At present, there is no medicine to treat COVID-19. Medicines that treat other diseases are being used on a trial basis to see if they are effective against COVID-19. Your health care provider will talk with you about ways to treat your symptoms. For most people, the infection is mild and can be managed at home with rest, fluids, and over-the-counter medicines. Treatment for a serious infection usually takes places in a hospital intensive care unit (ICU). It may include one or more of the following treatments. These treatments are given until your symptoms improve.  Receiving fluids and medicines through an IV.  Supplemental oxygen. Extra oxygen is given through a tube in the nose, a face mask, or a hood.  Positioning you to lie on your stomach (prone position). This makes it easier for oxygen to get into the lungs.  Continuous positive airway pressure (CPAP) or bi-level positive airway pressure (BPAP) machine. This treatment uses mild air pressure to keep the airways open. A tube that is connected to a motor delivers oxygen to the body.  Ventilator. This treatment moves air into and out of the lungs by using a tube that is placed in your windpipe.  Tracheostomy. This is a procedure to create a hole in the neck so that a breathing tube can be inserted.  Extracorporeal membrane oxygenation (ECMO). This procedure gives the lungs a chance to recover by taking over the functions of the heart and lungs. It supplies oxygen to the body and removes carbon dioxide. Follow these instructions at home: Lifestyle  If you are sick, stay home except to get medical care. Your health care provider will tell you how long to stay home. Call your health care provider before you go for medical care.  Rest at home as told by your health care provider.  Do not use any products that contain  nicotine or tobacco, such as cigarettes, e-cigarettes, and chewing tobacco. If you need help quitting, ask your health care provider.  Return to your normal activities as told by your health care provider. Ask your health care provider what activities are safe for you. General instructions  Take over-the-counter and prescription medicines only as told by your health care provider.  Drink enough fluid to keep your urine pale yellow.  Keep all follow-up visits as told by your health care provider. This is important. How is this prevented?  There is no vaccine to help prevent COVID-19 infection. However, there are steps you can take to protect yourself and others from this virus. To protect yourself:   Do not travel to areas where COVID-19 is a risk. The areas where COVID-19 is reported change often. To identify high-risk areas and travel restrictions, check the CDC travel website: FatFares.com.br  If you live in, or must travel to, an area where COVID-19 is a risk, take precautions to avoid infection. ? Stay away from people who are sick. ? Wash your hands often with  soap and water for 20 seconds. If soap and water are not available, use an alcohol-based hand sanitizer. ? Avoid touching your mouth, face, eyes, or nose. ? Avoid going out in public, follow guidance from your state and local health authorities. ? If you must go out in public, wear a cloth face covering or face mask. Make sure your mask covers your nose and mouth. ? Avoid crowded indoor spaces. Stay at least 6 feet (2 meters) away from others. ? Disinfect objects and surfaces that are frequently touched every day. This may include:  Counters and tables.  Doorknobs and light switches.  Sinks and faucets.  Electronics, such as phones, remote controls, keyboards, computers, and tablets. To protect others: If you have symptoms of COVID-19, take steps to prevent the virus from spreading to others.  If you think  you have a COVID-19 infection, contact your health care provider right away. Tell your health care team that you think you may have a COVID-19 infection.  Stay home. Leave your house only to seek medical care. Do not use public transport.  Do not travel while you are sick.  Wash your hands often with soap and water for 20 seconds. If soap and water are not available, use alcohol-based hand sanitizer.  Stay away from other members of your household. Let healthy household members care for children and pets, if possible. If you have to care for children or pets, wash your hands often and wear a mask. If possible, stay in your own room, separate from others. Use a different bathroom.  Make sure that all people in your household wash their hands well and often.  Cough or sneeze into a tissue or your sleeve or elbow. Do not cough or sneeze into your hand or into the air.  Wear a cloth face covering or face mask. Make sure your mask covers your nose and mouth. Where to find more information  Centers for Disease Control and Prevention: PurpleGadgets.be  World Health Organization: https://www.castaneda.info/ Contact a health care provider if:  You live in or have traveled to an area where COVID-19 is a risk and you have symptoms of the infection.  You have had contact with someone who has COVID-19 and you have symptoms of the infection. Get help right away if:  You have trouble breathing.  You have pain or pressure in your chest.  You have confusion.  You have bluish lips and fingernails.  You have difficulty waking from sleep.  You have symptoms that get worse. These symptoms may represent a serious problem that is an emergency. Do not wait to see if the symptoms will go away. Get medical help right away. Call your local emergency services (911 in the [place] Do not drive yourself to the hospital. Let the emergency medical personnel know if you think  you have COVID-19. Summary  COVID-19 is a respiratory infection that is caused by a virus. It is also known as coronavirus disease or novel coronavirus. It can cause serious infections, such as pneumonia, acute respiratory distress syndrome, acute respiratory failure, or sepsis.  The virus that causes COVID-19 is contagious. This means that it can spread from person to person through droplets from breathing, speaking, singing, coughing, or sneezing.  You are more likely to develop a serious illness if you are 50 years of age or older, have a weak immune system, live in a nursing home, or have chronic disease.  There is no medicine to treat COVID-19. Your health care provider  will talk with you about ways to treat your symptoms.  Take steps to protect yourself and others from infection. Wash your hands often and disinfect objects and surfaces that are frequently touched every day. Stay away from people who are sick and wear a mask if you are sick. This information is not intended to replace advice given to you by your health care provider. Make sure you discuss any questions you have with your health care provider. Document Revised: 01/07/2019 Document Reviewed: 04/15/2018 Elsevier Patient Education  [place].

## 2019-12-12 NOTE — ED Notes (Signed)
Pt in bed, pt satting 95% on 2L O2 via , pt states that he is feeling better, denies pain.

## 2019-12-13 ENCOUNTER — Ambulatory Visit (HOSPITAL_COMMUNITY)
Admit: 2019-12-13 | Discharge: 2019-12-13 | Disposition: A | Discharge status: Home / Self Care | Payer: Medicare Other | Attending: Pulmonary Disease | Admitting: Pulmonary Disease

## 2019-12-13 DIAGNOSIS — U071 COVID-19: Secondary | ICD-10-CM | POA: Diagnosis not present

## 2019-12-13 MED ORDER — SODIUM CHLORIDE 0.9 % IV SOLN: INTRAVENOUS | Status: DC | PRN

## 2019-12-13 MED ORDER — EPINEPHRINE 0.3 MG/0.3ML IJ SOAJ: 0.3000 mg | Freq: Once | INTRAMUSCULAR | Status: DC | PRN

## 2019-12-13 MED ORDER — ALBUTEROL SULFATE HFA 108 (90 BASE) MCG/ACT IN AERS: 2.0000 | INHALATION_SPRAY | Freq: Once | RESPIRATORY_TRACT | Status: DC | PRN

## 2019-12-13 MED ORDER — DIPHENHYDRAMINE HCL 50 MG/ML IJ SOLN: 50.0000 mg | Freq: Once | INTRAMUSCULAR | Status: DC | PRN

## 2019-12-13 MED ORDER — SODIUM CHLORIDE 0.9 % IV SOLN
100.0000 mg | Freq: Once | INTRAVENOUS | Status: AC
  Administered 2019-12-13: 100 mg via INTRAVENOUS
  Filled 2019-12-13: qty 20

## 2019-12-13 MED ORDER — METHYLPREDNISOLONE SODIUM SUCC 125 MG IJ SOLR: 125.0000 mg | Freq: Once | INTRAMUSCULAR | Status: DC | PRN

## 2019-12-13 MED ORDER — FAMOTIDINE IN NACL 20-0.9 MG/50ML-% IV SOLN: 20.0000 mg | Freq: Once | INTRAVENOUS | Status: DC | PRN

## 2019-12-13 NOTE — Progress Notes (Signed)
  Diagnosis: COVID-19  Physician:Dr Joya Gaskins  Procedure: [facility] Med: remdesivir infusion - Provided patient with remdesivir fact sheet for patients, parents and caregivers prior to infusion.  Complications: No immediate complications noted.  Discharge: Discharged home   [place], [place] 12/13/2019

## 2019-12-14 ENCOUNTER — Ambulatory Visit (HOSPITAL_COMMUNITY)
Admit: 2019-12-14 | Discharge: 2019-12-14 | Disposition: A | Discharge status: Home / Self Care | Payer: Medicare Other | Source: Ambulatory Visit | Attending: Pulmonary Disease | Admitting: Pulmonary Disease

## 2019-12-14 DIAGNOSIS — U071 COVID-19: Secondary | ICD-10-CM | POA: Insufficient documentation

## 2019-12-14 DIAGNOSIS — J1282 Pneumonia due to Coronavirus disease 2019: Secondary | ICD-10-CM | POA: Insufficient documentation

## 2019-12-14 MED ORDER — DIPHENHYDRAMINE HCL 50 MG/ML IJ SOLN: 50.0000 mg | Freq: Once | INTRAMUSCULAR | Status: DC | PRN

## 2019-12-14 MED ORDER — METHYLPREDNISOLONE SODIUM SUCC 125 MG IJ SOLR: 125.0000 mg | Freq: Once | INTRAMUSCULAR | Status: DC | PRN

## 2019-12-14 MED ORDER — SODIUM CHLORIDE 0.9 % IV SOLN
100.0000 mg | Freq: Once | INTRAVENOUS | Status: AC
  Administered 2019-12-14: 100 mg via INTRAVENOUS
  Filled 2019-12-14: qty 20

## 2019-12-14 MED ORDER — SODIUM CHLORIDE 0.9 % IV SOLN: INTRAVENOUS | Status: DC | PRN

## 2019-12-14 MED ORDER — FAMOTIDINE IN NACL 20-0.9 MG/50ML-% IV SOLN: 20.0000 mg | Freq: Once | INTRAVENOUS | Status: DC | PRN

## 2019-12-14 MED ORDER — ALBUTEROL SULFATE HFA 108 (90 BASE) MCG/ACT IN AERS: 2.0000 | INHALATION_SPRAY | Freq: Once | RESPIRATORY_TRACT | Status: DC | PRN

## 2019-12-14 MED ORDER — EPINEPHRINE 0.3 MG/0.3ML IJ SOAJ: 0.3000 mg | Freq: Once | INTRAMUSCULAR | Status: DC | PRN

## 2019-12-14 NOTE — Progress Notes (Signed)
  Diagnosis: COVID-19  Physician: Dr. Asencion Noble  Procedure: [facility] Med: remdesivir infusion - Provided patient with remdesivir fact sheet for patients, parents and caregivers prior to infusion.  Complications: No immediate complications noted.  Discharge: Discharged home   Gaye Alken 12/14/2019

## 2019-12-15 ENCOUNTER — Ambulatory Visit (HOSPITAL_COMMUNITY): Payer: Medicare Other

## 2020-01-04 ENCOUNTER — Encounter (HOSPITAL_COMMUNITY): Payer: Self-pay

## 2020-01-04 ENCOUNTER — Inpatient Hospital Stay (HOSPITAL_COMMUNITY): Payer: Medicare Other | Attending: Hematology

## 2020-01-04 ENCOUNTER — Other Ambulatory Visit: Payer: Self-pay

## 2020-01-04 ENCOUNTER — Emergency Department (HOSPITAL_COMMUNITY)
Admission: EM | Admit: 2020-01-04 | Discharge: 2020-01-04 | Disposition: A | Discharge status: Home / Self Care | Payer: Medicare Other | Attending: Emergency Medicine | Admitting: Emergency Medicine

## 2020-01-04 DIAGNOSIS — E119 Type 2 diabetes mellitus without complications: Secondary | ICD-10-CM | POA: Diagnosis not present

## 2020-01-04 DIAGNOSIS — Z79899 Other long term (current) drug therapy: Secondary | ICD-10-CM | POA: Insufficient documentation

## 2020-01-04 DIAGNOSIS — Z794 Long term (current) use of insulin: Secondary | ICD-10-CM | POA: Diagnosis not present

## 2020-01-04 DIAGNOSIS — T839XXA Unspecified complication of genitourinary prosthetic device, implant and graft, initial encounter: Secondary | ICD-10-CM

## 2020-01-04 DIAGNOSIS — I10 Essential (primary) hypertension: Secondary | ICD-10-CM | POA: Diagnosis not present

## 2020-01-04 DIAGNOSIS — T83031A Leakage of indwelling urethral catheter, initial encounter: Secondary | ICD-10-CM | POA: Insufficient documentation

## 2020-01-04 DIAGNOSIS — R339 Retention of urine, unspecified: Secondary | ICD-10-CM | POA: Insufficient documentation

## 2020-01-04 DIAGNOSIS — Z87891 Personal history of nicotine dependence: Secondary | ICD-10-CM | POA: Insufficient documentation

## 2020-01-04 DIAGNOSIS — Z96653 Presence of artificial knee joint, bilateral: Secondary | ICD-10-CM | POA: Diagnosis not present

## 2020-01-04 DIAGNOSIS — J449 Chronic obstructive pulmonary disease, unspecified: Secondary | ICD-10-CM | POA: Insufficient documentation

## 2020-01-04 DIAGNOSIS — Z952 Presence of prosthetic heart valve: Secondary | ICD-10-CM | POA: Diagnosis not present

## 2020-01-04 DIAGNOSIS — C189 Malignant neoplasm of colon, unspecified: Secondary | ICD-10-CM

## 2020-01-04 NOTE — Discharge Instructions (Addendum)
Your Foley catheter seems to be draining well at this point.  Call the urologist listed to arrange a follow-up appointment.  Return to the emergency department if needed

## 2020-01-04 NOTE — ED Notes (Signed)
ED Provider at bedside. 

## 2020-01-04 NOTE — ED Triage Notes (Signed)
Pt to er, family with pt, states that he had a cath placed 10 days ago and he is here today because he was leaking around his cath.

## 2020-01-04 NOTE — ED Notes (Signed)
Nsg Discharge Note  Admit Date:  01/04/2020 Discharge date: 01/04/2020   [place] to be D/C'd home per MD order.  AVS completed.  Copy for chart, and copy for patient signed, and dated. Patient/caregiver able to verbalize understanding.  Discharge Medication:   Discharge Assessment: Vitals:   01/04/20 1114 01/04/20 1130  BP: 128/86 120/74  Pulse: 92 83  Resp: 18   Temp: 97.9 F (36.6 C)   SpO2: 96% 98%   Skin clean, dry and intact without evidence of skin break down, no evidence of skin tears noted. IV catheter discontinued intact. Site without signs and symptoms of complications - no redness or edema noted at insertion site, patient denies c/o pain - only slight tenderness at site.  Dressing with slight pressure applied.  D/c Instructions-Education: Discharge instructions given to patient/family with verbalized understanding. D/c education completed with patient/family including follow up instructions, medication list, d/c activities limitations if indicated, with other d/c instructions as indicated by MD - patient able to verbalize understanding, all questions fully answered. Patient instructed to return to ED, call 911, or call MD for any changes in condition.  Patient escorted via [place], and D/C home via private auto.  Felicie Morn, RN 01/04/2020 12:01 PM

## 2020-01-04 NOTE — ED Notes (Signed)
Patient denies pain and is resting comfortably.  

## 2020-01-04 NOTE — ED Provider Notes (Signed)
[facility] EMERGENCY DEPARTMENT Provider Note   CSN: [id number] Arrival date & time: 01/04/20  1056     History Chief Complaint  Patient presents with  . Urinary Retention    Kevin Kirk is a 86 y.o. male.  HPI      Kevin Kirk is a 86 y.o. male who presents to the Emergency Department complaining of leaking urine and burning at the insertion site of his Foley catheter.  He noticed the symptoms this morning.  He was here getting blood drawn when symptoms began.  He states that he had a Foley catheter placed 10 days ago in [place] after having urinary retention.  He states that he does have an enlarged prostate.  He has been waiting for an appointment with urology in [place] but has been unable to be seen.  He states that his catheter has been draining well.  He denies fever, chills, abdominal pain, nausea or vomiting, pain or swelling of his penis or testicles.   Past Medical History:  Diagnosis Date  . Chronic pulmonary embolism ([place]) 38/2505   compliction of the valve replacement surgery  . COPD (chronic obstructive pulmonary disease) ([place])   . Diabetes ([place]) 04/22/2016  . H/O aortic valve replacement 10/2008  . Hypercholesteremia   . Hypertension   . Pulmonary emboli ([place]) 10/2008  . Sleep apnea     Patient Active Problem List   Diagnosis Date Noted  . Pneumonia due to COVID-19 virus 12/11/2019  . Sleep apnea   . COPD (chronic obstructive pulmonary disease) ([place])   . Hypertension   . History of pulmonary embolism   . Nausea and vomiting 11/11/2018  . Dehydration 11/11/2018  . Port-A-Cath in place 05/18/2017  . Iron deficiency anemia 09/07/2016  . S/P partial colectomy 07/11/2016  . 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[place])   . High cholesterol 04/22/2016  . H/O aortic valve replacement 04/22/2016  . Diabetes ([place]) 04/22/2016  . Chronic pulmonary embolism ([place]) 04/22/2016  . Rectal bleeding 04/22/2016  . DNR (do not resuscitate) 10/2008    . AKI (acute kidney injury) ([place]) 10/2008  . Hyperglycemia 10/2008  . Generalized weakness 10/2008    Past Surgical History:  Procedure Laterality Date  . AORTIC VALVE REPLACEMENT     2010  . CARDIAC CATHETERIZATION  2010  . COLONOSCOPY N/A 06/05/2016   Procedure: COLONOSCOPY;  Surgeon: Rogene Houston, MD;  Location: [facility] ENDO SUITE;  Service: Endoscopy;  Laterality: N/A;  . PARTIAL COLECTOMY N/A 07/11/2016   Procedure: PARTIAL COLECTOMY;  Surgeon: Aviva Signs, MD;  Location: [facility] ORS;  Service: General;  Laterality: N/A;  . PORTACATH PLACEMENT N/A 08/13/2016   Procedure: INSERTION PORT-A-CATH LEFT SUBCLAVIAN;  Surgeon: Aviva Signs, MD;  Location: [facility] ORS;  Service: General;  Laterality: N/A;  . [place]     from trauma       Family History  Problem Relation Age of Onset  . Colon cancer Neg Hx     Social History   Tobacco Use  . Smoking status: Former Smoker    Years: 15.00    Types: Cigarettes    Quit date: 1964    Years since quitting: 57.8  . Smokeless tobacco: Never Used  Vaping Use  . Vaping Use: Never used  Substance Use Topics  . Alcohol use: No  . Drug use: No    Home Medications Prior to Admission medications   Medication Sig Start Date  End Date Taking? Authorizing Provider  albuterol (VENTOLIN HFA) 108 (90 Base) MCG/ACT inhaler Inhale 2 puffs into the lungs every 4 (four) hours as needed for wheezing or shortness of breath. 12/12/19   Kathie Dike, MD  atorvastatin (LIPITOR) 10 MG tablet Take 10 mg by mouth at bedtime.     [provider]  Cholecalciferol (VITAMIN D3) 5000 units CAPS Take 5,000 Units by mouth daily.     [provider]  guaiFENesin-dextromethorphan (ROBITUSSIN DM) 100-10 MG/5ML syrup Take 10 mLs by mouth every 4 (four) hours as needed for cough. 12/12/19   Kathie Dike, MD  Insulin Regular Human (NOVOLIN R FLEXPEN) 100 UNIT/ML SOPN Inject 5 Units as directed with breakfast, with  lunch, and with evening meal. If sugar more than 150 12/12/19   Kathie Dike, MD  linagliptin (TRADJENTA) 5 MG TABS tablet Take 1 tablet (5 mg total) by mouth daily. 12/13/19   Kathie Dike, MD  metoprolol (LOPRESSOR) 50 MG tablet Take 50 mg by mouth 2 (two) times daily. 03/06/16   [provider]  Multiple Vitamins-Minerals (CENTRUM SILVER 50+MEN PO) Take 1 tablet by mouth daily.    [provider]  predniSONE (DELTASONE) 20 MG tablet Take 2 tablets (40 mg total) by mouth daily. 12/14/19   Kathie Dike, MD  RELION GLUCOSE TEST STRIPS test strip USE 1 STRIP TO CHECK GLUCOSE ONCE DAILY Patient not taking: Reported on 12/11/2019 04/22/18   [provider]  vitamin B-12 (CYANOCOBALAMIN) 1000 MCG tablet Take 1,000 mcg by mouth daily.     [provider]  prochlorperazine (COMPAZINE) 10 MG tablet Take 1 tablet (10 mg total) by mouth every 6 (six) hours as needed (Nausea or vomiting). 08/05/16 02/11/17  Twana First, MD    Allergies    Amoxicillin and Tamsulosin hcl  Review of Systems   Review of Systems  Constitutional: Negative for chills, fatigue and fever.  HENT: Negative for sore throat and trouble swallowing.   Respiratory: Negative for cough, shortness of breath and wheezing.   Cardiovascular: Negative for chest pain and palpitations.  Gastrointestinal: Negative for abdominal pain, blood in stool, nausea and vomiting.  Genitourinary: Negative for dysuria, flank pain, hematuria, penile swelling and scrotal swelling.       Leaking urine around his Foley catheter  Musculoskeletal: Negative for arthralgias, back pain, myalgias, neck pain and neck stiffness.  Skin: Negative for rash.  Neurological: Negative for dizziness, weakness and numbness.  Hematological: Does not bruise/bleed easily.    Physical Exam Updated Vital Signs BP 120/74   Pulse 83   Temp 97.9 F (36.6 C) (Oral)   Resp 18   Ht 6' (1.829 m)   Wt 83.9 kg   SpO2 98%   BMI 25.09  kg/m   Physical Exam Vitals and nursing note reviewed.  Constitutional:      Appearance: Normal appearance. He is not ill-appearing.  Cardiovascular:     Rate and Rhythm: Normal rate and regular rhythm.     Pulses: Normal pulses.  Pulmonary:     Effort: Pulmonary effort is normal.  Abdominal:     General: There is no distension.     Palpations: Abdomen is soft.     Tenderness: There is no abdominal tenderness.  Genitourinary:    Comments: Patient has Foley catheter in place.  Catheter appears properly positioned.  Draining well.  Urine is clear.  No obvious leaking of urine.   Musculoskeletal:        General: Normal range of motion.  Right lower leg: No edema.     Left lower leg: No edema.  Skin:    General: Skin is warm.     Capillary Refill: Capillary refill takes less than 2 seconds.     Findings: No rash.  Neurological:     General: No focal deficit present.     Mental Status: He is alert.     Sensory: No sensory deficit.     Motor: No weakness.     ED Results / Procedures / Treatments   Labs (all labs ordered are listed, but only abnormal results are displayed) Labs Reviewed - No data to display  EKG None  Radiology No results found.  Procedures Procedures (including critical care time)  Medications Ordered in ED Medications - No data to display  ED Course  I have reviewed the triage vital signs and the nursing notes.  Pertinent labs & imaging results that were available during my care of the patient were reviewed by me and considered in my medical decision making (see chart for details).    MDM Rules/Calculators/A&P                          Patient here with leaking urine from his Foley catheter that started this morning.  He had some irritation/burning at that time that has since resolved. .  Catheter has been in place for 10 days.  It was irrigated by nursing staff without difficulty.  Symptoms have now resolved.  Patient is resting comfortably.   He is now   asymptomatic   He was initially referred to urology in [place], but hasn't been able to get an appt.  Agreeable to f/u with local urology.     Final Clinical Impression(s) / ED Diagnoses Final diagnoses:  None    Rx / DC Orders ED Discharge Orders    None       Kem Parkinson, PA-C 01/04/20 1858    Nat Christen, MD 01/06/20 1439

## 2020-01-09 ENCOUNTER — Ambulatory Visit (HOSPITAL_COMMUNITY): Admission: RE | Admit: 2020-01-09 | Payer: Medicare Other | Source: Ambulatory Visit

## 2020-01-09 ENCOUNTER — Other Ambulatory Visit (HOSPITAL_COMMUNITY): Payer: Medicare Other

## 2020-01-16 ENCOUNTER — Ambulatory Visit (HOSPITAL_COMMUNITY): Payer: Medicare Other | Admitting: Hematology

## 2020-02-02 ENCOUNTER — Encounter (HOSPITAL_COMMUNITY): Payer: Self-pay | Admitting: *Deleted

## 2020-02-02 ENCOUNTER — Inpatient Hospital Stay (HOSPITAL_COMMUNITY): Payer: Medicare Other | Attending: Hematology

## 2020-02-02 ENCOUNTER — Ambulatory Visit (HOSPITAL_COMMUNITY)
Admission: RE | Admit: 2020-02-02 | Discharge: 2020-02-02 | Disposition: A | Discharge status: Home / Self Care | Payer: Medicare Other | Source: Ambulatory Visit | Attending: Hematology | Admitting: Hematology

## 2020-02-02 ENCOUNTER — Other Ambulatory Visit: Payer: Self-pay

## 2020-02-02 DIAGNOSIS — C189 Malignant neoplasm of colon, unspecified: Secondary | ICD-10-CM | POA: Insufficient documentation

## 2020-02-02 DIAGNOSIS — C184 Malignant neoplasm of transverse colon: Secondary | ICD-10-CM

## 2020-02-02 LAB — COMPREHENSIVE METABOLIC PANEL
ALT: 34 U/L (ref 0–44)
AST: 29 U/L (ref 15–41)
Albumin: 3.5 g/dL (ref 3.5–5.0)
Alkaline Phosphatase: 66 U/L (ref 38–126)
Anion gap: 10 (ref 5–15)
BUN: 17 mg/dL (ref 8–23)
CO2: 28 mmol/L (ref 22–32)
Calcium: 8.9 mg/dL (ref 8.9–10.3)
Chloride: 101 mmol/L (ref 98–111)
Creatinine, Ser: 0.98 mg/dL (ref 0.61–1.24)
GFR, Estimated: >60 mL/min (ref >60)
Glucose, Bld: 115 mg/dL — ABNORMAL HIGH (ref 70–99)
Potassium: 4.6 mmol/L (ref 3.5–5.1)
Sodium: 139 mmol/L (ref 135–145)
Total Bilirubin: 0.7 mg/dL (ref 0.3–1.2)
Total Protein: 7.3 g/dL (ref 6.5–8.1)

## 2020-02-02 LAB — CBC WITH DIFFERENTIAL/PLATELET
Basophils Absolute: 0 10*3/uL (ref 0.0–0.1)
Basophils Relative: 0 %
Eosinophils Absolute: 0 10*3/uL (ref 0.0–0.5)
Eosinophils Relative: 0 %
HCT: 38.4 % — ABNORMAL LOW (ref 39.0–52.0)
Hemoglobin: 11.9 g/dL — ABNORMAL LOW (ref 13.0–17.0)
Lymphocytes Relative: 49 %
Lymphs Abs: 6.3 10*3/uL — ABNORMAL HIGH (ref 0.7–4.0)
MCH: 32.3 pg (ref 26.0–34.0)
MCHC: 31 g/dL (ref 30.0–36.0)
MCV: 104.3 fL — ABNORMAL HIGH (ref 80.0–100.0)
Monocytes Absolute: 1 10*3/uL (ref 0.1–1.0)
Monocytes Relative: 8 %
Neutro Abs: 5.5 10*3/uL (ref 1.7–7.7)
Neutrophils Relative %: 43 %
Platelets: 272 10*3/uL (ref 150–400)
RBC: 3.68 MIL/uL — ABNORMAL LOW (ref 4.22–5.81)
RDW: 15.1 % (ref 11.5–15.5)
WBC: 12.9 10*3/uL — ABNORMAL HIGH (ref 4.0–10.5)
nRBC: 0 % (ref 0.0–0.2)

## 2020-02-02 MED ORDER — IOHEXOL 300 MG/ML  SOLN
100.0000 mL | Freq: Once | INTRAMUSCULAR | Status: AC | PRN
  Administered 2020-02-02: 100 mL via INTRAVENOUS

## 2020-02-03 ENCOUNTER — Other Ambulatory Visit (HOSPITAL_COMMUNITY): Payer: Medicare Other

## 2020-02-03 LAB — CEA: CEA: 4.9 ng/mL — ABNORMAL HIGH (ref 0.0–4.7)

## 2020-02-08 ENCOUNTER — Inpatient Hospital Stay (HOSPITAL_BASED_OUTPATIENT_CLINIC_OR_DEPARTMENT_OTHER): Payer: Medicare Other | Admitting: Hematology

## 2020-02-08 ENCOUNTER — Other Ambulatory Visit: Payer: Self-pay

## 2020-02-08 DIAGNOSIS — C184 Malignant neoplasm of transverse colon: Secondary | ICD-10-CM

## 2020-02-08 NOTE — Progress Notes (Signed)
Virtual Visit via Telephone Note  I connected with Kevin Kirk on 02/08/20 at  1:00 PM EST by telephone and verified that I am speaking with the correct person using two identifiers.  Location: Patient: At home Provider: In the office   I discussed the limitations, risks, security and privacy concerns of performing an evaluation and management service by telephone and the availability of in person appointments. I also discussed with the patient that there may be a patient responsible charge related to this service. The patient expressed understanding and agreed to proceed.   History of Present Illness: Kevin Kirk is followed in our clinic for stage IV colon cancer.  He was treated in the past with FOLFOX followed by FOLFIRI and bevacizumab followed by 5-FU and bevacizumab maintenance until 02/22/2019.   Observations/Objective: He apparently had Covid infection in September.  His energy levels have not come back to normal.  They are at 75%.  Denies any change in bowels.  Denies any bleeding per rectum or melena.  No abdominal pains reported.  Assessment and Plan:  1.  Stage IV colon cancer, MSI high: -Reviewed labs from 02/02/2020 which showed normal LFTs.  CEA slightly elevated at 4.9. -Reviewed CTAP from 02/02/2020 which did not show any evidence of metastatic disease.  Peritoneal/pelvic nodularity is also unchanged favoring areas of splenosis.  Stable 4.4 cm ascending thoracic aortic aneurysm. 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 -RTC 4 months with repeat CEA, other labs and CT scan.  2.  Diabetes: -He is apparently receiving insulin at nighttime and is having trouble controlling sugars.  He is blood sugar was 115 on our blood work. -He was wondering whether he could go back on Metformin and come off of insulin.  He will talk to Dr. Shon Millet about it on Monday.  3.  Peripheral neuropathy: -He has some numbness in the hands and feet from prior chemotherapy  which is stable.  Not requiring gabapentin.   Follow Up Instructions: RTC 4 months with labs and CT scan 1 week prior.   I discussed the assessment and treatment plan with the patient. The patient was provided an opportunity to ask questions and all were answered. The patient agreed with the plan and demonstrated an understanding of the instructions.   The patient was advised to call back or seek an in-person evaluation if the symptoms worsen or if the condition fails to improve as anticipated.  I provided 11 minutes of non-face-to-face time during this encounter.   Derek Jack, MD

## 2020-06-01 ENCOUNTER — Inpatient Hospital Stay (HOSPITAL_COMMUNITY): Payer: Medicare Other

## 2020-06-01 ENCOUNTER — Ambulatory Visit (HOSPITAL_COMMUNITY)
Admission: RE | Admit: 2020-06-01 | Discharge: 2020-06-01 | Disposition: A | Discharge status: Home / Self Care | Payer: Medicare Other | Source: Ambulatory Visit | Attending: Hematology | Admitting: Hematology

## 2020-06-01 ENCOUNTER — Encounter (HOSPITAL_COMMUNITY): Payer: Self-pay

## 2020-06-01 ENCOUNTER — Other Ambulatory Visit: Payer: Self-pay

## 2020-06-01 ENCOUNTER — Inpatient Hospital Stay (HOSPITAL_COMMUNITY): Payer: Medicare Other | Attending: Hematology and Oncology

## 2020-06-01 DIAGNOSIS — C184 Malignant neoplasm of transverse colon: Secondary | ICD-10-CM

## 2020-06-01 LAB — CBC WITH DIFFERENTIAL/PLATELET
Abs Immature Granulocytes: 0.03 10*3/uL (ref 0.00–0.07)
Basophils Absolute: 0.1 10*3/uL (ref 0.0–0.1)
Basophils Relative: 1 %
Eosinophils Absolute: 0.4 10*3/uL (ref 0.0–0.5)
Eosinophils Relative: 3 %
HCT: 40.7 % (ref 39.0–52.0)
Hemoglobin: 13.2 g/dL (ref 13.0–17.0)
Immature Granulocytes: 0 %
Lymphocytes Relative: 35 %
Lymphs Abs: 3.8 10*3/uL (ref 0.7–4.0)
MCH: 33.1 pg (ref 26.0–34.0)
MCHC: 32.4 g/dL (ref 30.0–36.0)
MCV: 102 fL — ABNORMAL HIGH (ref 80.0–100.0)
Monocytes Absolute: 1 10*3/uL (ref 0.1–1.0)
Monocytes Relative: 9 %
Neutro Abs: 5.4 10*3/uL (ref 1.7–7.7)
Neutrophils Relative %: 52 %
Platelets: 188 10*3/uL (ref 150–400)
RBC: 3.99 MIL/uL — ABNORMAL LOW (ref 4.22–5.81)
RDW: 13.1 % (ref 11.5–15.5)
WBC: 10.6 10*3/uL — ABNORMAL HIGH (ref 4.0–10.5)
nRBC: 0 % (ref 0.0–0.2)

## 2020-06-01 LAB — COMPREHENSIVE METABOLIC PANEL
ALT: 23 U/L (ref 0–44)
AST: 22 U/L (ref 15–41)
Albumin: 3.9 g/dL (ref 3.5–5.0)
Alkaline Phosphatase: 56 U/L (ref 38–126)
Anion gap: 10 (ref 5–15)
BUN: 15 mg/dL (ref 8–23)
CO2: 26 mmol/L (ref 22–32)
Calcium: 9 mg/dL (ref 8.9–10.3)
Chloride: 104 mmol/L (ref 98–111)
Creatinine, Ser: 0.84 mg/dL (ref 0.61–1.24)
GFR, Estimated: >60 mL/min (ref >60)
Glucose, Bld: 120 mg/dL — ABNORMAL HIGH (ref 70–99)
Potassium: 4.3 mmol/L (ref 3.5–5.1)
Sodium: 140 mmol/L (ref 135–145)
Total Bilirubin: 0.9 mg/dL (ref 0.3–1.2)
Total Protein: 7 g/dL (ref 6.5–8.1)

## 2020-06-01 MED ORDER — IOHEXOL 300 MG/ML  SOLN
100.0000 mL | Freq: Once | INTRAMUSCULAR | Status: AC | PRN
  Administered 2020-06-01: 100 mL via INTRAVENOUS

## 2020-06-01 MED ORDER — HEPARIN SOD (PORK) LOCK FLUSH 100 UNIT/ML IV SOLN
INTRAVENOUS | Status: AC
  Administered 2020-06-01: 500 [IU]
  Filled 2020-06-01: qty 5

## 2020-06-01 NOTE — Progress Notes (Signed)
Kevin Kirk presented for [employer] and flush.  Portacath located left chest wall accessed with  PH 20 needle.  Good blood return present. Portacath flushed with 39ml NS and 500U/76ml Heparin and needle removed intact.  Procedure tolerated well and without incident.  Discharged ambulatory in c/o family in stable condition.

## 2020-06-02 LAB — CEA: CEA: 3.7 ng/mL (ref 0.0–4.7)

## 2020-06-07 ENCOUNTER — Other Ambulatory Visit: Payer: Self-pay

## 2020-06-07 ENCOUNTER — Inpatient Hospital Stay (HOSPITAL_BASED_OUTPATIENT_CLINIC_OR_DEPARTMENT_OTHER): Payer: Medicare Other | Admitting: Hematology

## 2020-06-07 VITALS — HR 67 | Temp 98.0°F | Resp 17 | Wt 204.0 lb

## 2020-06-07 DIAGNOSIS — Z86711 Personal history of pulmonary embolism: Secondary | ICD-10-CM | POA: Diagnosis not present

## 2020-06-07 DIAGNOSIS — C184 Malignant neoplasm of transverse colon: Secondary | ICD-10-CM

## 2020-06-07 DIAGNOSIS — G629 Polyneuropathy, unspecified: Secondary | ICD-10-CM | POA: Diagnosis not present

## 2020-06-07 DIAGNOSIS — Z87891 Personal history of nicotine dependence: Secondary | ICD-10-CM | POA: Insufficient documentation

## 2020-06-07 NOTE — Progress Notes (Signed)
[facility] [address] [place] [address]   CLINIC:  Medical Oncology/Hematology  PCP:  Tobe Sos, MD [address] [place] [address] [phone]   REASON FOR VISIT:  Follow-up for transverse colon adenocarcinoma  PRIOR THERAPY:  1. FOLFOX x 12 cycles from 08/26/2016 to 01/27/2017. 2. Opdivo from 02/23/2017 to 07/13/2017. 3. Maintenance 5-FU and Avastin from 10/27/2018 to 02/22/2019.  NGS Results: K-RAS mutation negative, BRAF V 600 E positive, PIK3CA, TP53, CDKN2A  CURRENT THERAPY: Surveillance  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[place])  07/11/2016 Initial Diagnosis   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[place])   07/27/2017 -  Chemotherapy   The patient had dexamethasone (DECADRON) 4 MG tablet, 8 mg, Oral, Daily, 1 of 1 cycle, Start date: --, End date: -- palonosetron (ALOXI) injection 0.25 mg, 0.25 mg, Intravenous,  Once, 41 of 45 cycles Administration: 0.25 mg (07/27/2017), 0.25 mg (08/10/2017), 0.25 mg (08/24/2017), 0.25 mg (09/07/2017), 0.25 mg (09/21/2017), 0.25 mg (10/05/2017), 0.25 mg (10/19/2017), 0.25 mg (11/02/2017), 0.25 mg (11/16/2017), 0.25 mg (11/30/2017), 0.25 mg (12/14/2017), 0.25 mg (12/28/2017), 0.25 mg (01/12/2018), 0.25 mg (01/26/2018), 0.25 mg (02/09/2018), 0.25 mg (02/24/2018), 0.25 mg (03/10/2018), 0.25 mg (03/29/2018), 0.25 mg (04/12/2018), 0.25 mg (04/26/2018), 0.25 mg (05/10/2018), 0.25 mg (05/24/2018), 0.25 mg (06/07/2018), 0.25 mg (06/21/2018), 0.25 mg (07/05/2018), 0.25 mg (07/19/2018), 0.25 mg (08/03/2018), 0.25 mg (08/18/2018), 0.25 mg (09/01/2018), 0.25 mg (09/15/2018), 0.25 mg (09/29/2018), 0.25 mg (10/13/2018), 0.25 mg (10/27/2018), 0.25 mg (11/10/2018), 0.25 mg (11/24/2018), 0.25 mg (12/08/2018), 0.25 mg (12/22/2018), 0.25 mg (01/05/2019), 0.25 mg (01/19/2019), 0.25 mg (02/02/2019), 0.25 mg (02/22/2019) pegfilgrastim-cbqv (UDENYCA) injection 6 mg, 6 mg, Subcutaneous, Once, 7 of 7 cycles Administration: 6 mg  (10/07/2017), 6 mg (10/21/2017), 6 mg (11/04/2017), 6 mg (11/18/2017), 6 mg (12/02/2017), 6 mg (12/16/2017), 6 mg (12/30/2017) bevacizumab-bvzr (ZIRABEV) chemo injection 400 mg, 5 mg/kg = 400 mg (100 % of original dose 5 mg/kg), Intravenous,  Once, 2 of 2 cycles Dose modification: 5 mg/kg (original dose 5 mg/kg, Cycle 35) bevacizumab (AVASTIN) 450 mg in sodium chloride 0.9 % 100 mL chemo infusion, 5 mg/kg = 450 mg, Intravenous,  Once, 35 of 35 cycles Dose modification: 4 mg/kg (80 % of original dose 5 mg/kg, Cycle 5, Reason: Provider Judgment), 5 mg/kg (original dose 5 mg/kg, Cycle 16, Reason: Other (see comments)), 5 mg/kg (original dose 5 mg/kg, Cycle 35) Administration: 450 mg (07/27/2017), 450 mg (08/10/2017), 450 mg (08/24/2017), 450 mg (09/07/2017), 450 mg (09/21/2017), 450 mg (10/05/2017), 450 mg (10/19/2017), 450 mg (11/02/2017), 450 mg (11/16/2017), 450 mg (11/30/2017), 450 mg (12/14/2017), 450 mg (12/28/2017), 400 mg (01/12/2018), 400 mg (01/26/2018), 400 mg (02/09/2018), 400 mg (02/24/2018), 400 mg (03/10/2018), 400 mg (03/29/2018), 400 mg (04/12/2018), 400 mg (04/26/2018), 400 mg (05/10/2018), 400 mg (05/24/2018), 400 mg (06/07/2018), 400 mg (06/21/2018), 400 mg (07/05/2018), 400 mg (07/19/2018), 400 mg (08/03/2018), 400 mg (08/18/2018), 400 mg (09/01/2018), 400 mg (09/15/2018), 400 mg (09/29/2018), 400 mg (10/13/2018), 400 mg (10/27/2018), 400 mg (11/10/2018), 400 mg (11/24/2018) irinotecan (CAMPTOSAR) 400 mg in dextrose 5 % 500 mL chemo infusion, 180 mg/m2 = 400 mg, Intravenous,  Once, 32 of 32 cycles Dose modification: 144 mg/m2 (80 % of original dose 180 mg/m2, Cycle 5, Reason: Provider Judgment), 135 mg/m2 (75 % of original dose 180 mg/m2, Cycle 30, Reason: Other (see comments), Comment: diarrhea) Administration: 400 mg (07/27/2017), 400 mg (08/10/2017), 400 mg (08/24/2017), 400 mg (09/07/2017), 320 mg (09/21/2017), 320 mg (10/05/2017), 320 mg (  10/19/2017), 320 mg (11/02/2017), 320 mg (11/16/2017), 320 mg (11/30/2017), 320 mg (12/14/2017), 320 mg  (12/28/2017), 320 mg (01/12/2018), 320 mg (01/26/2018), 320 mg (02/09/2018), 300 mg (02/24/2018), 300 mg (03/10/2018), 300 mg (03/29/2018), 300 mg (04/12/2018), 300 mg (04/26/2018), 300 mg (05/10/2018), 300 mg (05/24/2018), 300 mg (06/07/2018), 300 mg (06/21/2018), 300 mg (07/05/2018), 300 mg (07/19/2018), 300 mg (08/03/2018), 300 mg (08/18/2018), 300 mg (09/01/2018), 280 mg (09/15/2018), 280 mg (09/29/2018), 280 mg (10/13/2018) leucovorin 800 mg in dextrose 5 % 250 mL infusion, 872 mg, Intravenous,  Once, 41 of 45 cycles Dose modification: 320 mg/m2 (80 % of original dose 400 mg/m2, Cycle 5, Reason: Provider Judgment) Administration: 800 mg (07/27/2017), 800 mg (08/10/2017), 800 mg (08/24/2017), 800 mg (09/07/2017), 700 mg (09/21/2017), 700 mg (10/05/2017), 700 mg (10/19/2017), 700 mg (11/02/2017), 700 mg (11/16/2017), 700 mg (11/30/2017), 700 mg (12/14/2017), 700 mg (12/28/2017), 700 mg (01/12/2018), 700 mg (01/26/2018), 700 mg (02/09/2018), 700 mg (02/24/2018), 700 mg (03/10/2018), 700 mg (03/29/2018), 700 mg (04/12/2018), 700 mg (04/26/2018), 700 mg (05/10/2018), 700 mg (05/24/2018), 700 mg (06/07/2018), 700 mg (06/21/2018), 700 mg (07/05/2018), 700 mg (07/19/2018), 700 mg (08/03/2018), 700 mg (08/18/2018), 700 mg (09/01/2018), 700 mg (09/15/2018), 700 mg (09/29/2018), 700 mg (10/13/2018), 700 mg (10/27/2018), 700 mg (11/10/2018), 700 mg (11/24/2018), 700 mg (12/08/2018), 700 mg (12/22/2018), 700 mg (01/05/2019), 700 mg (01/19/2019), 700 mg (02/02/2019), 700 mg (02/22/2019) fluorouracil (ADRUCIL) chemo injection 850 mg, 400 mg/m2 = 850 mg, Intravenous,  Once, 41 of 45 cycles Dose modification: 320 mg/m2 (80 % of original dose 400 mg/m2, Cycle 5, Reason: Provider Judgment) Administration: 850 mg (07/27/2017), 850 mg (08/10/2017), 850 mg (08/24/2017), 850 mg (09/07/2017), 700 mg (09/21/2017), 700 mg (10/05/2017), 700 mg (10/19/2017), 700 mg (11/02/2017), 700 mg (11/16/2017), 700 mg (11/30/2017), 700 mg (12/14/2017), 700 mg (12/28/2017), 700 mg (01/12/2018), 700 mg (01/26/2018), 700 mg  (02/09/2018), 650 mg (02/24/2018), 650 mg (03/10/2018), 650 mg (03/29/2018), 650 mg (04/12/2018), 650 mg (04/26/2018), 650 mg (05/10/2018), 650 mg (05/24/2018), 650 mg (06/07/2018), 650 mg (06/21/2018), 650 mg (07/05/2018), 650 mg (07/19/2018), 650 mg (08/03/2018), 650 mg (08/18/2018), 650 mg (09/01/2018), 650 mg (09/15/2018), 650 mg (09/29/2018), 650 mg (10/13/2018), 650 mg (10/27/2018), 650 mg (11/10/2018), 650 mg (11/24/2018), 650 mg (12/08/2018), 650 mg (12/22/2018), 650 mg (01/05/2019), 650 mg (01/19/2019), 650 mg (02/02/2019), 650 mg (02/22/2019) fosaprepitant (EMEND) 150 mg in sodium chloride 0.9 % 145 mL IVPB, , Intravenous,  Once, 7 of 11 cycles Administration:  (11/24/2018),  (12/08/2018),  (12/22/2018),  (01/05/2019),  (01/19/2019),  (02/02/2019),  (02/22/2019) fluorouracil (ADRUCIL) 5,250 mg in sodium chloride 0.9 % 145 mL chemo infusion, 2,400 mg/m2 = 5,250 mg, Intravenous, 1 Day/Dose, 41 of 45 cycles Dose modification: 1,920 mg/m2 (80 % of original dose 2,400 mg/m2, Cycle 5, Reason: Provider Judgment) Administration: 5,250 mg (07/27/2017), 5,250 mg (08/10/2017), 5,250 mg (08/24/2017), 5,250 mg (09/07/2017), 4,200 mg (09/21/2017), 4,200 mg (10/05/2017), 4,200 mg (10/19/2017), 4,200 mg (11/02/2017), 4,200 mg (11/16/2017), 4,200 mg (11/30/2017), 4,200 mg (12/14/2017), 4,200 mg (12/28/2017), 4,200 mg (01/12/2018), 4,200 mg (01/26/2018), 4,200 mg (02/09/2018), 3,950 mg (02/24/2018), 3,950 mg (03/10/2018), 3,950 mg (03/29/2018), 3,950 mg (04/12/2018), 3,950 mg (04/26/2018), 3,950 mg (05/10/2018), 3,950 mg (05/24/2018), 3,950 mg (06/07/2018), 3,950 mg (06/21/2018), 3,950 mg (07/05/2018), 3,950 mg (07/19/2018), 3,950 mg (08/03/2018), 3,950 mg (08/18/2018), 3,950 mg (09/01/2018), 3,950 mg (09/15/2018), 3,950 mg (09/29/2018), 3,950 mg (10/13/2018), 3,950 mg (10/27/2018), 3,950 mg (11/10/2018), 3,950 mg (11/24/2018), 3,950 mg (12/08/2018), 3,950 mg (12/22/2018), 3,950 mg (01/05/2019), 3,950 mg (01/19/2019), 3,950 mg (02/02/2019), 3,950 mg (02/22/2019) bevacizumab-bvzr (ZIRABEV) 400  mg  in sodium chloride 0.9 % 100 mL chemo infusion, 5 mg/kg = 400 mg (100 % of original dose 5 mg/kg), Intravenous,  Once, 6 of 10 cycles Dose modification: 5 mg/kg (original dose 5 mg/kg, Cycle 36) Administration: 400 mg (12/08/2018), 400 mg (12/22/2018), 400 mg (01/05/2019), 400 mg (01/19/2019), 400 mg (02/02/2019), 400 mg (02/22/2019)  for chemotherapy treatment.      CANCER STAGING: Cancer Staging Malignant neoplasm of transverse colon [facility]) Staging form: Colon and Rectum, AJCC 8th Edition - Clinical: cT3, cN1a, cM1 - Signed by Twana First, MD on 03/09/2017 - Pathologic: No stage assigned - Unsigned   INTERVAL HISTORY:  Mr. Kevin Kirk, a 87 y.o. male, returns for routine follow-up of his transverse colon adenocarcinoma. Kolbe was last contacted via telephone on 02/08/2020.   Today he is accompanied by his daughter and he reports feeling well. He continues having numbness in his feet and hands, which is staying stable. He stays active at home and has not been picking up any heavy items yet. He is checking his fasting BG every morning   REVIEW OF SYSTEMS:  Review of Systems  Constitutional: Positive for fatigue (75%). Negative for appetite change.  Neurological: Positive for numbness (hands & feet).  Hematological: Bruises/bleeds easily (easily).  All other systems reviewed and are negative.   PAST MEDICAL/SURGICAL HISTORY:  Past Medical History:  Diagnosis Date  . Chronic pulmonary embolism ([place]) 86/7672   compliction of the valve replacement surgery  . COPD (chronic obstructive pulmonary disease) ([place])   . Diabetes ([place]) 04/22/2016  . H/O aortic valve replacement 10/2008  . Hypercholesteremia   . Hypertension   . Pulmonary emboli ([place]) 10/2008  . Sleep apnea    Past Surgical History:  Procedure Laterality Date  . AORTIC VALVE REPLACEMENT     2010  . CARDIAC CATHETERIZATION  2010  . COLONOSCOPY N/A 06/05/2016   Procedure: COLONOSCOPY;  Surgeon: Rogene Houston, MD;   Location: [facility] ENDO SUITE;  Service: Endoscopy;  Laterality: N/A;  . PARTIAL COLECTOMY N/A 07/11/2016   Procedure: PARTIAL COLECTOMY;  Surgeon: Aviva Signs, MD;  Location: [facility] ORS;  Service: General;  Laterality: N/A;  . PORTACATH PLACEMENT N/A 08/13/2016   Procedure: INSERTION PORT-A-CATH LEFT SUBCLAVIAN;  Surgeon: Aviva Signs, MD;  Location: [facility] ORS;  Service: General;  Laterality: N/A;  . REPLACEMENT TOTAL KNEE     1996  . spenectomy     from trauma    SOCIAL HISTORY:  Social History   Socioeconomic History  . Marital status: Widowed    Spouse name: Not on file  . Number of children: Not on file  . Years of education: Not on file  . Highest education level: Not on file  Occupational History  . Not on file  Tobacco Use  . Smoking status: Former Smoker    Years: 15.00    Types: Cigarettes    Quit date: 1964    Years since quitting: 58.2  . Smokeless tobacco: Never Used  Vaping Use  . Vaping Use: Never used  Substance and Sexual Activity  . Alcohol use: No  . Drug use: No  . Sexual activity: Yes  Other Topics Concern  . Not on file  Social History Narrative  . Not on file   Social Determinants of Health   Financial Resource Strain: Not on file  Food Insecurity: Not on file  Transportation Needs: Not on file  Physical Activity: Not on file  Stress: Not on file  Social Connections: Not on  file  Intimate Partner Violence: Not on file    FAMILY HISTORY:  Family History  Problem Relation Age of Onset  . Colon cancer Neg Hx     CURRENT MEDICATIONS:  Current Outpatient Medications  Medication Sig Dispense Refill  . alfuzosin (UROXATRAL) 10 MG 24 hr tablet     . atorvastatin (LIPITOR) 10 MG tablet Take 10 mg by mouth at bedtime.     . Cholecalciferol (VITAMIN D3) 5000 units CAPS Take 5,000 Units by mouth daily.     . insulin detemir (LEVEMIR) 100 UNIT/ML injection Inject into the skin.    . Insulin Regular Human (NOVOLIN R FLEXPEN) 100 UNIT/ML SOPN Inject 5 Units  as directed with breakfast, with lunch, and with evening meal. If sugar more than 150 3 mL 0  . lisinopril (ZESTRIL) 10 MG tablet Take by mouth.    . metoprolol (LOPRESSOR) 50 MG tablet Take 50 mg by mouth 2 (two) times daily.    . Multiple Vitamins-Minerals (CENTRUM SILVER 50+MEN PO) Take 1 tablet by mouth daily.    Marland Kitchen RELION GLUCOSE TEST STRIPS test strip USE 1 STRIP TO CHECK GLUCOSE ONCE DAILY    . vitamin B-12 (CYANOCOBALAMIN) 1000 MCG tablet Take 1,000 mcg by mouth daily.      No current facility-administered medications for this visit.   Facility-Administered Medications Ordered in Other Visits  Medication Dose Route Frequency Provider Last Rate Last Admin  . sodium chloride flush (NS) 0.9 % injection 10 mL  10 mL Intracatheter PRN Higgs, Vetta, MD      . sodium chloride flush (NS) 0.9 % injection 20 mL  20 mL Intravenous PRN Derek Jack, MD   20 mL at 07/07/19 1130    ALLERGIES:  Allergies  Allergen Reactions  . Amoxicillin Hives    Has patient had a PCN reaction causing immediate rash, facial/tongue/throat swelling, SOB or lightheadedness with hypotension: No Has patient had a PCN reaction causing severe rash involving mucus membranes or skin necrosis: Yes Has patient had a PCN reaction that required hospitalization No Has patient had a PCN reaction occurring within the last 10 years: No If all of the above answers are "NO", then may proceed with Cephalosporin use.   . Tamsulosin Hcl     Cant sleep, confusion     PHYSICAL EXAM:  Performance status (ECOG): 1 - Symptomatic but completely ambulatory  Vitals:   06/07/20 1433  Pulse: 67  Resp: 17  Temp: 98 F (36.7 C)  SpO2: 98%   Wt Readings from Last 3 Encounters:  06/07/20 204 lb (92.5 kg)  01/04/20 185 lb (83.9 kg)  12/11/19 188 lb 6.4 oz (85.5 kg)   Physical Exam Vitals reviewed.  Constitutional:      Appearance: Normal appearance.  Cardiovascular:     Rate and Rhythm: Normal rate and regular rhythm.      Pulses: Normal pulses.     Heart sounds: Normal heart sounds.  Pulmonary:     Effort: Pulmonary effort is normal.     Breath sounds: Normal breath sounds.  Abdominal:     Palpations: Abdomen is soft. There is no hepatomegaly or mass.     Tenderness: There is no abdominal tenderness.     Hernia: No hernia is present.  Neurological:     General: No focal deficit present.     Mental Status: He is alert and oriented to person, place, and time.  Psychiatric:        Mood and Affect: Mood normal.  Behavior: Behavior normal.      LABORATORY DATA:  I have reviewed the labs as listed.  CBC Latest Ref Rng & Units 06/01/2020 02/02/2020 12/12/2019  WBC 4.0 - 10.5 K/uL 10.6(H) 12.9(H) 4.2  Hemoglobin 13.0 - 17.0 g/dL 13.2 11.9(L) 12.7(L)  Hematocrit 39.0 - 52.0 % 40.7 38.4(L) 39.0  Platelets 150 - 400 K/uL 188 272 154   CMP Latest Ref Rng & Units 06/01/2020 02/02/2020 12/12/2019  Glucose 70 - 99 mg/dL 120(H) 115(H) 232(H)  BUN 8 - 23 mg/dL '15 17 21  ' Creatinine 0.61 - 1.24 mg/dL 0.84 0.98 1.01  Sodium 135 - 145 mmol/L 140 139 135  Potassium 3.5 - 5.1 mmol/L 4.3 4.6 4.4  Chloride 98 - 111 mmol/L 104 101 111  CO2 22 - 32 mmol/L 26 28 17(L)  Calcium 8.9 - 10.3 mg/dL 9.0 8.9 8.2(L)  Total Protein 6.5 - 8.1 g/dL 7.0 7.3 6.4(L)  Total Bilirubin 0.3 - 1.2 mg/dL 0.9 0.7 0.6  Alkaline Phos 38 - 126 U/L 56 66 36(L)  AST 15 - 41 U/L 22 29 41  ALT 0 - 44 U/L 23 34 25    DIAGNOSTIC IMAGING:  I have independently reviewed the scans and discussed with the patient. CT Abdomen Pelvis W Contrast  Result Date: 06/02/2020 CLINICAL DATA:  History of colorectal cancer.  Restaging. EXAM: CT ABDOMEN AND PELVIS WITH CONTRAST TECHNIQUE: Multidetector CT imaging of the abdomen and pelvis was performed using the standard protocol following bolus administration of intravenous contrast. CONTRAST:  14m OMNIPAQUE IOHEXOL 300 MG/ML  SOLN COMPARISON:  02/02/2020 FINDINGS: Lower chest: The lung bases are clear  of an acute process. The heart is normal in size. Stable aortic and coronary artery calcifications. Hepatobiliary: No hepatic lesions to suggest hepatic metastatic disease. The gallbladder demonstrates small scattered gallstones. No common bile duct dilatation. Pancreas: No mass, inflammation or ductal dilatation. Spleen: Status post splenectomy with small scattered areas of splenosis. Adrenals/Urinary Tract: Adrenal glands and kidneys are unremarkable and stable. The bladder is unremarkable. Stomach/Bowel: Stomach, duodenum, small bowel colon are grossly normal. No acute inflammatory changes, mass lesions or obstructive findings. Surgical changes from a prior right hemicolectomy. No findings suspicious for recurrent tumor. There is moderate to advanced sigmoid colon diverticulosis but no findings for acute diverticulitis. Vascular/Lymphatic: Stable tortuosity and calcification of the abdominal aorta and stable branch vessel calcifications. The major venous structures are patent. Stable IVC filter. No mesenteric or retroperitoneal mass or adenopathy. Reproductive: Stable prostate gland enlargement. The seminal vesicles are unremarkable. Other: No pelvic mass or adenopathy. No free pelvic fluid collections. No inguinal mass or adenopathy. No abdominal wall hernia or subcutaneous lesions. Musculoskeletal: No significant bony findings. Stable large bridging osteophytes in the lumbar spine. IMPRESSION: 1. Status post right hemicolectomy. No findings suspicious for recurrent tumor, locoregional adenopathy or metastatic disease. 2. Cholelithiasis. 3. Stable advanced atherosclerotic calcifications involving the aorta and branch vessels. 4. Stable prostate gland enlargement. 5. Aortic atherosclerosis. Aortic Atherosclerosis (ICD10-I70.0). Electronically Signed   By: PMarijo SanesM.D.   On: 06/02/2020 15:32     ASSESSMENT:  1.  Stage IV colon cancer (pT3 pN1 M1) MSI high: - K-ras mutation negative, BRAF V600 E+,  PIK3CA, TP53, CDKN2A -12 cycles of FOLFOX from 08/26/2016 through 01/27/2017 -Opdivo from 02/23/2017 through 07/13/2017 -FOLFIRI and bevacizumab from 07/27/2017 through 10/13/2018 -5-FU and bevacizumab maintenance from 10/27/2018 through 02/22/2019. -CT CAP on 02/01/2019 showed 12 mm short axis subcarinal lymph node improved.  No suspicious findings for metastatic disease in the  abdomen or pelvis.  Stable nodularity in the left upper abdomen and right lower quadrant. -He is on chemo holiday at this time. -Last CEA in January was 7.5.  Last CT scan on 05/02/2019 did not show any evidence of metastatic disease.  Groundglass opacity with more solid appearance in the right lung compared to previous scan from November 2020.  This finding is nonspecific and could be inflammatory.  Stable peritoneal nodularity in the splenectomy bed.   2.  Peripheral neuropathy: -Improved.  Not requiring gabapentin.   PLAN:  1.  Stage IV colon cancer (pT3 pN1 M1) MSI high: -He does not have any clinical signs or symptoms of recurrence at this time. -Review of CT [facility] from 06/01/2020 which showed no recurrence of metastatic disease. -CEA of 3.7.  LFTs are normal. -Recommend follow-up in 12 months with repeat scan and labs.  2.  Peripheral neuropathy: -This has been stable with no pharmacological intervention.  3.  Diabetes: -His fasting sugars are in the 130 and 140 range.  He will follow up with Dr. Shon Millet.    Orders placed this encounter:  No orders of the defined types were placed in this encounter.    Derek Jack, MD [place] [phone]   I, Milinda Antis, am acting as a scribe for Dr. Sanda Linger.  I, Derek Jack MD, have reviewed the above documentation for accuracy and completeness, and I agree with the above.

## 2020-06-07 NOTE — Patient Instructions (Addendum)
[place] at [facility] Discharge Instructions  You were seen today by Dr. Delton Coombes. He went over your recent results and scans. You will be scheduled to have a CT scan of your abdomen done before your next visit. Dr. Delton Coombes will see you back in 4 months for labs and follow up.   Thank you for choosing [place] at [facility] to provide your oncology and hematology care.  To afford each patient quality time with our provider, please arrive at least 15 minutes before your scheduled appointment time.   If you have a lab appointment with the [facility] please come in thru the Main Entrance and check in at the main information desk  You need to re-schedule your appointment should you arrive 10 or more minutes late.  We strive to give you quality time with our providers, and arriving late affects you and other patients whose appointments are after yours.  Also, if you no show three or more times for appointments you may be dismissed from the clinic at the providers discretion.     Again, thank you for choosing [facility].  Our hope is that these requests will decrease the amount of time that you wait before being seen by our physicians.       _____________________________________________________________  Should you have questions after your visit to [facility], please contact our office at [phone] between the hours of 8:00 a.m. and 4:30 p.m.  Voicemails left after 4:00 p.m. will not be returned until the following business day.  For prescription refill requests, have your pharmacy contact our office and allow 72 hours.    Cancer Center Support Programs:   > Cancer Support Group  2nd Tuesday of the month 1pm-2pm, [facility]

## 2020-09-29 ENCOUNTER — Encounter (HOSPITAL_COMMUNITY): Payer: Self-pay

## 2020-10-04 ENCOUNTER — Encounter (HOSPITAL_COMMUNITY): Payer: Self-pay

## 2020-10-04 ENCOUNTER — Ambulatory Visit (HOSPITAL_COMMUNITY)
Admission: RE | Admit: 2020-10-04 | Discharge: 2020-10-04 | Disposition: A | Discharge status: Home / Self Care | Payer: Medicare Other | Source: Ambulatory Visit | Attending: Hematology | Admitting: Hematology

## 2020-10-04 ENCOUNTER — Inpatient Hospital Stay (HOSPITAL_COMMUNITY): Payer: Medicare Other | Attending: Hematology

## 2020-10-04 ENCOUNTER — Other Ambulatory Visit: Payer: Self-pay

## 2020-10-04 DIAGNOSIS — C184 Malignant neoplasm of transverse colon: Secondary | ICD-10-CM

## 2020-10-04 LAB — CBC WITH DIFFERENTIAL/PLATELET
Abs Immature Granulocytes: 0.03 10*3/uL (ref 0.00–0.07)
Basophils Absolute: 0.1 10*3/uL (ref 0.0–0.1)
Basophils Relative: 1 %
Eosinophils Absolute: 0.3 10*3/uL (ref 0.0–0.5)
Eosinophils Relative: 3 %
HCT: 42 % (ref 39.0–52.0)
Hemoglobin: 13.7 g/dL (ref 13.0–17.0)
Immature Granulocytes: 0 %
Lymphocytes Relative: 35 %
Lymphs Abs: 3.6 10*3/uL (ref 0.7–4.0)
MCH: 33 pg (ref 26.0–34.0)
MCHC: 32.6 g/dL (ref 30.0–36.0)
MCV: 101.2 fL — ABNORMAL HIGH (ref 80.0–100.0)
Monocytes Absolute: 1.1 10*3/uL — ABNORMAL HIGH (ref 0.1–1.0)
Monocytes Relative: 10 %
Neutro Abs: 5.3 10*3/uL (ref 1.7–7.7)
Neutrophils Relative %: 51 %
Platelets: 188 10*3/uL (ref 150–400)
RBC: 4.15 MIL/uL — ABNORMAL LOW (ref 4.22–5.81)
RDW: 12.8 % (ref 11.5–15.5)
WBC: 10.4 10*3/uL (ref 4.0–10.5)
nRBC: 0 % (ref 0.0–0.2)

## 2020-10-04 LAB — COMPREHENSIVE METABOLIC PANEL
ALT: 21 U/L (ref 0–44)
AST: 22 U/L (ref 15–41)
Albumin: 4 g/dL (ref 3.5–5.0)
Alkaline Phosphatase: 58 U/L (ref 38–126)
Anion gap: 5 (ref 5–15)
BUN: 18 mg/dL (ref 8–23)
CO2: 29 mmol/L (ref 22–32)
Calcium: 8.8 mg/dL — ABNORMAL LOW (ref 8.9–10.3)
Chloride: 102 mmol/L (ref 98–111)
Creatinine, Ser: 1.05 mg/dL (ref 0.61–1.24)
GFR, Estimated: >60 mL/min (ref >60)
Glucose, Bld: 142 mg/dL — ABNORMAL HIGH (ref 70–99)
Potassium: 4.7 mmol/L (ref 3.5–5.1)
Sodium: 136 mmol/L (ref 135–145)
Total Bilirubin: 0.9 mg/dL (ref 0.3–1.2)
Total Protein: 7 g/dL (ref 6.5–8.1)

## 2020-10-04 LAB — POCT I-STAT CREATININE: Creatinine, Ser: 1.1 mg/dL (ref 0.61–1.24)

## 2020-10-04 MED ORDER — IOHEXOL 300 MG/ML  SOLN
100.0000 mL | Freq: Once | INTRAMUSCULAR | Status: AC | PRN
  Administered 2020-10-04: 100 mL via INTRAVENOUS

## 2020-10-04 MED ORDER — HEPARIN SOD (PORK) LOCK FLUSH 100 UNIT/ML IV SOLN
INTRAVENOUS | Status: AC
  Administered 2020-10-04: 500 [IU] via INTRAVENOUS
  Filled 2020-10-04: qty 5

## 2020-10-04 MED ORDER — HEPARIN SOD (PORK) LOCK FLUSH 100 UNIT/ML IV SOLN: 500.0000 [IU] | Freq: Once | INTRAVENOUS | Status: AC

## 2020-10-04 NOTE — Progress Notes (Signed)
Port flushed with good blood return noted, labs drawn. No bruising or swelling at site. Patient left accessed and sent to CT for scan, patient discharged in satisfactory condition. VVS stable with no signs or symptoms of distressed noted.

## 2020-10-04 NOTE — Patient Instructions (Signed)
[facility]  Discharge Instructions: Thank you for choosing [facility] to provide your oncology and hematology care.  If you have a lab appointment with the [facility], please come in thru the Main Entrance and check in at the main information desk.  Wear comfortable clothing and clothing appropriate for easy access to any Portacath or PICC line.   We strive to give you quality time with your provider. You may need to reschedule your appointment if you arrive late (15 or more minutes).  Arriving late affects you and other patients whose appointments are after yours.  Also, if you miss three or more appointments without notifying the office, you may be dismissed from the clinic at the provider's discretion.      For prescription refill requests, have your pharmacy contact our office and allow 72 hours for refills to be completed.    Today your port was accessed for CT scan today, port flushed and labs were drawn.   To help prevent nausea and vomiting after your treatment, we encourage you to take your nausea medication as directed.  BELOW ARE SYMPTOMS THAT SHOULD BE REPORTED IMMEDIATELY: *FEVER GREATER THAN 100.4 F (38 C) OR HIGHER *CHILLS OR SWEATING *NAUSEA AND VOMITING THAT IS NOT CONTROLLED WITH YOUR NAUSEA MEDICATION *UNUSUAL SHORTNESS OF BREATH *UNUSUAL BRUISING OR BLEEDING *URINARY PROBLEMS (pain or burning when urinating, or frequent urination) *BOWEL PROBLEMS (unusual diarrhea, constipation, pain near the anus) TENDERNESS IN MOUTH AND THROAT WITH OR WITHOUT PRESENCE OF ULCERS (sore throat, sores in mouth, or a toothache) UNUSUAL RASH, SWELLING OR PAIN  UNUSUAL VAGINAL DISCHARGE OR ITCHING   Items with * indicate a potential emergency and should be followed up as soon as possible or go to the Emergency Department if any problems should occur.  Please show the CHEMOTHERAPY ALERT CARD or IMMUNOTHERAPY ALERT CARD at check-in to the Emergency Department  and triage nurse.  Should you have questions after your visit or need to cancel or reschedule your appointment, please contact [facility] [phone]  and follow the prompts.  Office hours are 8:00 a.m. to 4:30 p.m. Monday - Friday. Please note that voicemails left after 4:00 p.m. may not be returned until the following business day.  We are closed weekends and major holidays. You have access to a nurse at all times for urgent questions. Please call the main number to the clinic [phone] and follow the prompts.  For any non-urgent questions, you may also contact your provider using MyChart. We now offer e-Visits for anyone 85 and older to request care online for non-urgent symptoms. For details visit [email or web address].   Also download the MyChart app! Go to the app store, search "MyChart", open the app, select [facility], and log in with your MyChart username and password.  Due to Covid, a mask is required upon entering the hospital/clinic. If you do not have a mask, one will be given to you upon arrival. For doctor visits, patients may have 1 support person aged 85 or older with them. For treatment visits, patients cannot have anyone with them due to current Covid guidelines and our immunocompromised population.

## 2020-10-05 LAB — CEA: CEA: 3.5 ng/mL (ref 0.0–4.7)

## 2020-10-11 ENCOUNTER — Inpatient Hospital Stay (HOSPITAL_BASED_OUTPATIENT_CLINIC_OR_DEPARTMENT_OTHER): Payer: Medicare Other | Admitting: Oncology

## 2020-10-11 ENCOUNTER — Ambulatory Visit (HOSPITAL_COMMUNITY): Payer: Medicare Other | Admitting: Hematology

## 2020-10-11 DIAGNOSIS — G629 Polyneuropathy, unspecified: Secondary | ICD-10-CM | POA: Diagnosis not present

## 2020-10-11 DIAGNOSIS — C184 Malignant neoplasm of transverse colon: Secondary | ICD-10-CM | POA: Diagnosis not present

## 2020-10-11 MED ORDER — DULOXETINE HCL 20 MG PO CPEP: 40.0000 mg | ORAL_CAPSULE | Freq: Every day | ORAL | 3 refills | Status: AC

## 2020-10-11 NOTE — Progress Notes (Signed)
[facility] [address]  [address]   CLINIC:  Medical Oncology/Hematology  PCP:  Tobe Sos, MD [address] [place] [address] [phone]  I connected with ADAR RASE on 10/12/20 at 10:40 AM EDT by telephone visit and verified that I am speaking with the correct person using two identifiers.   I discussed the limitations, risks, security and privacy concerns of performing an evaluation and management service by telemedicine and the availability of in-person appointments. I also discussed with the patient that there may be a patient responsible charge related to this service. The patient expressed understanding and agreed to proceed.   Other persons participating in the visit and their role in the encounter: Patient, daughter and NP  Patient's location: Home  Provider's location: Clinic    REASON FOR VISIT:  Follow-up for transverse colon adenocarcinoma  PRIOR THERAPY:  1. FOLFOX x 12 cycles from 08/26/2016 to 01/27/2017. 2. Opdivo from 02/23/2017 to 07/13/2017. 3. Maintenance 5-FU and Avastin from 10/27/2018 to 02/22/2019.  NGS Results: K-RAS mutation negative, BRAF V 600 E positive, PIK3CA, TP53, CDKN2A  CURRENT THERAPY: Surveillance  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[place])  07/11/2016 Initial Diagnosis   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[place])    07/27/2017 -  Chemotherapy   The patient had dexamethasone (DECADRON) 4 MG tablet, 8 mg, Oral, Daily, 1 of 1 cycle, Start date: --, End date: -- palonosetron (ALOXI) injection 0.25 mg, 0.25 mg, Intravenous,  Once, 41 of 45 cycles Administration: 0.25 mg (07/27/2017), 0.25 mg (08/10/2017), 0.25 mg (08/24/2017), 0.25 mg (09/07/2017), 0.25 mg (09/21/2017), 0.25 mg (10/05/2017), 0.25 mg (10/19/2017), 0.25 mg (11/02/2017), 0.25 mg (11/16/2017), 0.25 mg (11/30/2017), 0.25 mg (12/14/2017), 0.25 mg (12/28/2017), 0.25 mg (01/12/2018), 0.25 mg (01/26/2018),  0.25 mg (02/09/2018), 0.25 mg (02/24/2018), 0.25 mg (03/10/2018), 0.25 mg (03/29/2018), 0.25 mg (04/12/2018), 0.25 mg (04/26/2018), 0.25 mg (05/10/2018), 0.25 mg (05/24/2018), 0.25 mg (06/07/2018), 0.25 mg (06/21/2018), 0.25 mg (07/05/2018), 0.25 mg (07/19/2018), 0.25 mg (08/03/2018), 0.25 mg (08/18/2018), 0.25 mg (09/01/2018), 0.25 mg (09/15/2018), 0.25 mg (09/29/2018), 0.25 mg (10/13/2018), 0.25 mg (10/27/2018), 0.25 mg (11/10/2018), 0.25 mg (11/24/2018), 0.25 mg (12/08/2018), 0.25 mg (12/22/2018), 0.25 mg (01/05/2019), 0.25 mg (01/19/2019), 0.25 mg (02/02/2019), 0.25 mg (02/22/2019) pegfilgrastim-cbqv (UDENYCA) injection 6 mg, 6 mg, Subcutaneous, Once, 7 of 7 cycles Administration: 6 mg (10/07/2017), 6 mg (10/21/2017), 6 mg (11/04/2017), 6 mg (11/18/2017), 6 mg (12/02/2017), 6 mg (12/16/2017), 6 mg (12/30/2017) bevacizumab-bvzr (ZIRABEV) chemo injection 400 mg, 5 mg/kg = 400 mg (100 % of original dose 5 mg/kg), Intravenous,  Once, 2 of 2 cycles Dose modification: 5 mg/kg (original dose 5 mg/kg, Cycle 35) bevacizumab (AVASTIN) 450 mg in sodium chloride 0.9 % 100 mL chemo infusion, 5 mg/kg = 450 mg, Intravenous,  Once, 35 of 35 cycles Dose modification: 4 mg/kg (80 % of original dose 5 mg/kg, Cycle 5, Reason: Provider Judgment), 5 mg/kg (original dose 5 mg/kg, Cycle 16, Reason: Other (see comments)), 5 mg/kg (original dose 5 mg/kg, Cycle 35) Administration: 450 mg (07/27/2017), 450 mg (08/10/2017), 450 mg (08/24/2017), 450 mg (09/07/2017), 450 mg (09/21/2017), 450 mg (10/05/2017), 450 mg (10/19/2017), 450 mg (11/02/2017), 450 mg (11/16/2017), 450 mg (11/30/2017), 450 mg (12/14/2017), 450 mg (12/28/2017), 400 mg (01/12/2018), 400 mg (01/26/2018), 400 mg (02/09/2018), 400 mg (02/24/2018), 400 mg (03/10/2018), 400 mg (03/29/2018), 400 mg (04/12/2018), 400 mg (04/26/2018), 400 mg (05/10/2018), 400 mg (05/24/2018), 400 mg (06/07/2018), 400 mg (06/21/2018), 400 mg (  07/05/2018), 400 mg (07/19/2018), 400 mg (08/03/2018), 400 mg (08/18/2018), 400 mg (09/01/2018), 400 mg (09/15/2018),  400 mg (09/29/2018), 400 mg (10/13/2018), 400 mg (10/27/2018), 400 mg (11/10/2018), 400 mg (11/24/2018) irinotecan (CAMPTOSAR) 400 mg in dextrose 5 % 500 mL chemo infusion, 180 mg/m2 = 400 mg, Intravenous,  Once, 32 of 32 cycles Dose modification: 144 mg/m2 (80 % of original dose 180 mg/m2, Cycle 5, Reason: Provider Judgment), 135 mg/m2 (75 % of original dose 180 mg/m2, Cycle 30, Reason: Other (see comments), Comment: diarrhea) Administration: 400 mg (07/27/2017), 400 mg (08/10/2017), 400 mg (08/24/2017), 400 mg (09/07/2017), 320 mg (09/21/2017), 320 mg (10/05/2017), 320 mg (10/19/2017), 320 mg (11/02/2017), 320 mg (11/16/2017), 320 mg (11/30/2017), 320 mg (12/14/2017), 320 mg (12/28/2017), 320 mg (01/12/2018), 320 mg (01/26/2018), 320 mg (02/09/2018), 300 mg (02/24/2018), 300 mg (03/10/2018), 300 mg (03/29/2018), 300 mg (04/12/2018), 300 mg (04/26/2018), 300 mg (05/10/2018), 300 mg (05/24/2018), 300 mg (06/07/2018), 300 mg (06/21/2018), 300 mg (07/05/2018), 300 mg (07/19/2018), 300 mg (08/03/2018), 300 mg (08/18/2018), 300 mg (09/01/2018), 280 mg (09/15/2018), 280 mg (09/29/2018), 280 mg (10/13/2018) leucovorin 800 mg in dextrose 5 % 250 mL infusion, 872 mg, Intravenous,  Once, 41 of 45 cycles Dose modification: 320 mg/m2 (80 % of original dose 400 mg/m2, Cycle 5, Reason: Provider Judgment) Administration: 800 mg (07/27/2017), 800 mg (08/10/2017), 800 mg (08/24/2017), 800 mg (09/07/2017), 700 mg (09/21/2017), 700 mg (10/05/2017), 700 mg (10/19/2017), 700 mg (11/02/2017), 700 mg (11/16/2017), 700 mg (11/30/2017), 700 mg (12/14/2017), 700 mg (12/28/2017), 700 mg (01/12/2018), 700 mg (01/26/2018), 700 mg (02/09/2018), 700 mg (02/24/2018), 700 mg (03/10/2018), 700 mg (03/29/2018), 700 mg (04/12/2018), 700 mg (04/26/2018), 700 mg (05/10/2018), 700 mg (05/24/2018), 700 mg (06/07/2018), 700 mg (06/21/2018), 700 mg (07/05/2018), 700 mg (07/19/2018), 700 mg (08/03/2018), 700 mg (08/18/2018), 700 mg (09/01/2018), 700 mg (09/15/2018), 700 mg (09/29/2018), 700 mg (10/13/2018), 700 mg (10/27/2018), 700  mg (11/10/2018), 700 mg (11/24/2018), 700 mg (12/08/2018), 700 mg (12/22/2018), 700 mg (01/05/2019), 700 mg (01/19/2019), 700 mg (02/02/2019), 700 mg (02/22/2019) fluorouracil (ADRUCIL) chemo injection 850 mg, 400 mg/m2 = 850 mg, Intravenous,  Once, 41 of 45 cycles Dose modification: 320 mg/m2 (80 % of original dose 400 mg/m2, Cycle 5, Reason: Provider Judgment) Administration: 850 mg (07/27/2017), 850 mg (08/10/2017), 850 mg (08/24/2017), 850 mg (09/07/2017), 700 mg (09/21/2017), 700 mg (10/05/2017), 700 mg (10/19/2017), 700 mg (11/02/2017), 700 mg (11/16/2017), 700 mg (11/30/2017), 700 mg (12/14/2017), 700 mg (12/28/2017), 700 mg (01/12/2018), 700 mg (01/26/2018), 700 mg (02/09/2018), 650 mg (02/24/2018), 650 mg (03/10/2018), 650 mg (03/29/2018), 650 mg (04/12/2018), 650 mg (04/26/2018), 650 mg (05/10/2018), 650 mg (05/24/2018), 650 mg (06/07/2018), 650 mg (06/21/2018), 650 mg (07/05/2018), 650 mg (07/19/2018), 650 mg (08/03/2018), 650 mg (08/18/2018), 650 mg (09/01/2018), 650 mg (09/15/2018), 650 mg (09/29/2018), 650 mg (10/13/2018), 650 mg (10/27/2018), 650 mg (11/10/2018), 650 mg (11/24/2018), 650 mg (12/08/2018), 650 mg (12/22/2018), 650 mg (01/05/2019), 650 mg (01/19/2019), 650 mg (02/02/2019), 650 mg (02/22/2019) fosaprepitant (EMEND) 150 mg in sodium chloride 0.9 % 145 mL IVPB, , Intravenous,  Once, 7 of 11 cycles Administration:  (11/24/2018),  (12/08/2018),  (12/22/2018),  (01/05/2019),  (01/19/2019),  (02/02/2019),  (02/22/2019) fluorouracil (ADRUCIL) 5,250 mg in sodium chloride 0.9 % 145 mL chemo infusion, 2,400 mg/m2 = 5,250 mg, Intravenous, 1 Day/Dose, 41 of 45 cycles Dose modification: 1,920 mg/m2 (80 % of original dose 2,400 mg/m2, Cycle 5, Reason: Provider Judgment) Administration: 5,250 mg (07/27/2017), 5,250 mg (08/10/2017), 5,250 mg (08/24/2017), 5,250 mg (09/07/2017), 4,200 mg (09/21/2017), 4,200 mg (10/05/2017), 4,200  mg (10/19/2017), 4,200 mg (11/02/2017), 4,200 mg (11/16/2017), 4,200 mg (11/30/2017), 4,200 mg (12/14/2017), 4,200 mg (12/28/2017), 4,200 mg  (01/12/2018), 4,200 mg (01/26/2018), 4,200 mg (02/09/2018), 3,950 mg (02/24/2018), 3,950 mg (03/10/2018), 3,950 mg (03/29/2018), 3,950 mg (04/12/2018), 3,950 mg (04/26/2018), 3,950 mg (05/10/2018), 3,950 mg (05/24/2018), 3,950 mg (06/07/2018), 3,950 mg (06/21/2018), 3,950 mg (07/05/2018), 3,950 mg (07/19/2018), 3,950 mg (08/03/2018), 3,950 mg (08/18/2018), 3,950 mg (09/01/2018), 3,950 mg (09/15/2018), 3,950 mg (09/29/2018), 3,950 mg (10/13/2018), 3,950 mg (10/27/2018), 3,950 mg (11/10/2018), 3,950 mg (11/24/2018), 3,950 mg (12/08/2018), 3,950 mg (12/22/2018), 3,950 mg (01/05/2019), 3,950 mg (01/19/2019), 3,950 mg (02/02/2019), 3,950 mg (02/22/2019) bevacizumab-bvzr (ZIRABEV) 400 mg in sodium chloride 0.9 % 100 mL chemo infusion, 5 mg/kg = 400 mg (100 % of original dose 5 mg/kg), Intravenous,  Once, 6 of 10 cycles Dose modification: 5 mg/kg (original dose 5 mg/kg, Cycle 36) Administration: 400 mg (12/08/2018), 400 mg (12/22/2018), 400 mg (01/05/2019), 400 mg (01/19/2019), 400 mg (02/02/2019), 400 mg (02/22/2019)   for chemotherapy treatment.       CANCER STAGING: Cancer Staging Malignant neoplasm of transverse colon [facility]) Staging form: Colon and Rectum, AJCC 8th Edition - Clinical: cT3, cN1a, cM1 - Signed by Twana First, MD on 03/09/2017 - Pathologic: No stage assigned - Unsigned   INTERVAL HISTORY:  Mr. Kevin Kirk, a 87 y.o. male, returns for routine follow-up of his transverse colon adenocarcinoma. [place] was last seen in clinic on 06/07/20.  He has been on a chemo holiday since December 2020.  Currently under surveillance with imaging every 3 to 4 months and lab work.  Today he is accompanied by his daughter.  He reports doing well denies any new concerns.  He has chronic numbness in bilateral lower and upper extremities.  Previously tried gabapentin but unfortunately made him sleepy and unstable when walking.  States it is no worse but is still bothersome.  REVIEW OF SYSTEMS:  Review of Systems  Neurological:   Positive for numbness.   PAST MEDICAL/SURGICAL HISTORY:  Past Medical History:  Diagnosis Date   Chronic pulmonary embolism ([place]) 42/8768   compliction of the valve replacement surgery   COPD (chronic obstructive pulmonary disease) ([place])    Diabetes ([place]) 04/22/2016   H/O aortic valve replacement 10/2008   Hypercholesteremia    Hypertension    Pulmonary emboli ([place]) 10/2008   Sleep apnea    Past Surgical History:  Procedure Laterality Date   AORTIC VALVE REPLACEMENT     2010   CARDIAC CATHETERIZATION  2010   COLONOSCOPY N/A 06/05/2016   Procedure: COLONOSCOPY;  Surgeon: Rogene Houston, MD;  Location: [facility] ENDO SUITE;  Service: Endoscopy;  Laterality: N/A;   PARTIAL COLECTOMY N/A 07/11/2016   Procedure: PARTIAL COLECTOMY;  Surgeon: Aviva Signs, MD;  Location: [facility] ORS;  Service: General;  Laterality: N/A;   PORTACATH PLACEMENT N/A 08/13/2016   Procedure: INSERTION PORT-A-CATH LEFT SUBCLAVIAN;  Surgeon: Aviva Signs, MD;  Location: [facility] ORS;  Service: General;  Laterality: N/A;   REPLACEMENT TOTAL KNEE     1996   spenectomy     from trauma    SOCIAL HISTORY:  Social History   Socioeconomic History   Marital status: Widowed    Spouse name: Not on file   Number of children: Not on file   Years of education: Not on file   Highest education level: Not on file  Occupational History   Not on file  Tobacco Use   Smoking status: Former    Years: 15.00  Types: Cigarettes    Quit date: 1964    Years since quitting: 58.5   Smokeless tobacco: Never  Vaping Use   Vaping Use: Never used  Substance and Sexual Activity   Alcohol use: No   Drug use: No   Sexual activity: Yes  Other Topics Concern   Not on file  Social History Narrative   Not on file   Social Determinants of Health   Financial Resource Strain: Not on file  Food Insecurity: Not on file  Transportation Needs: Not on file  Physical Activity: Not on file  Stress: Not on file  Social Connections: Not on file   Intimate Partner Violence: Not on file    FAMILY HISTORY:  Family History  Problem Relation Age of Onset   Colon cancer Neg Hx     CURRENT MEDICATIONS:  Current Outpatient Medications  Medication Sig Dispense Refill   alfuzosin (UROXATRAL) 10 MG 24 hr tablet      atorvastatin (LIPITOR) 10 MG tablet Take 10 mg by mouth at bedtime.      Cholecalciferol (VITAMIN D3) 5000 units CAPS Take 5,000 Units by mouth daily.      DULoxetine (CYMBALTA) 20 MG capsule Take 2 capsules (40 mg total) by mouth daily. 90 capsule 3   insulin detemir (LEVEMIR) 100 UNIT/ML injection Inject into the skin.     Insulin Regular Human (NOVOLIN R FLEXPEN) 100 UNIT/ML SOPN Inject 5 Units as directed with breakfast, with lunch, and with evening meal. If sugar more than 150 3 mL 0   lisinopril (ZESTRIL) 10 MG tablet Take by mouth.     metFORMIN (GLUCOPHAGE) 500 MG tablet Take 1 tablet by mouth 2 (two) times daily.     metoprolol (LOPRESSOR) 50 MG tablet Take 50 mg by mouth 2 (two) times daily.     RELION GLUCOSE TEST STRIPS test strip USE 1 STRIP TO CHECK GLUCOSE ONCE DAILY     vitamin B-12 (CYANOCOBALAMIN) 1000 MCG tablet Take 1,000 mcg by mouth daily.      No current facility-administered medications for this visit.   Facility-Administered Medications Ordered in Other Visits  Medication Dose Route Frequency Provider Last Rate Last Admin   sodium chloride flush (NS) 0.9 % injection 10 mL  10 mL Intracatheter PRN Higgs, Mathis Dad, MD       sodium chloride flush (NS) 0.9 % injection 20 mL  20 mL Intravenous PRN Derek Jack, MD   20 mL at 07/07/19 1130    ALLERGIES:  Allergies  Allergen Reactions   Amoxicillin Hives    Has patient had a PCN reaction causing immediate rash, facial/tongue/throat swelling, SOB or lightheadedness with hypotension: No Has patient had a PCN reaction causing severe rash involving mucus membranes or skin necrosis: Yes Has patient had a PCN reaction that required hospitalization  No Has patient had a PCN reaction occurring within the last 10 years: No If all of the above answers are "NO", then may proceed with Cephalosporin use.    Tamsulosin Hcl     Cant sleep, confusion     PHYSICAL EXAM:  Performance status (ECOG): 1 - Symptomatic but completely ambulatory  There were no vitals filed for this visit.  Wt Readings from Last 3 Encounters:  10/04/20 216 lb 3.2 oz (98.1 kg)  06/07/20 204 lb (92.5 kg)  01/04/20 185 lb (83.9 kg)   Physical Exam Neurological:     Mental Status: He is alert and oriented to person, place, and time.     LABORATORY  DATA:  I have reviewed the labs as listed.  CBC Latest Ref Rng & Units 10/04/2020 06/01/2020 02/02/2020  WBC 4.0 - 10.5 K/uL 10.4 10.6(H) 12.9(H)  Hemoglobin 13.0 - 17.0 g/dL 13.7 13.2 11.9(L)  Hematocrit 39.0 - 52.0 % 42.0 40.7 38.4(L)  Platelets 150 - 400 K/uL 188 188 272   CMP Latest Ref Rng & Units 10/04/2020 10/04/2020 06/01/2020  Glucose 70 - 99 mg/dL - 142(H) 120(H)  BUN 8 - 23 mg/dL - 18 15  Creatinine 0.61 - 1.24 mg/dL 1.10 1.05 0.84  Sodium 135 - 145 mmol/L - 136 140  Potassium 3.5 - 5.1 mmol/L - 4.7 4.3  Chloride 98 - 111 mmol/L - 102 104  CO2 22 - 32 mmol/L - 29 26  Calcium 8.9 - 10.3 mg/dL - 8.8(L) 9.0  Total Protein 6.5 - 8.1 g/dL - 7.0 7.0  Total Bilirubin 0.3 - 1.2 mg/dL - 0.9 0.9  Alkaline Phos 38 - 126 U/L - 58 56  AST 15 - 41 U/L - 22 22  ALT 0 - 44 U/L - 21 23    DIAGNOSTIC IMAGING:  I have independently reviewed the scans and discussed with the patient. CT Abdomen Pelvis W Contrast  Result Date: 10/05/2020 CLINICAL DATA:  Colorectal cancer. Adenocarcinoma of the transverse colon. Restaging. EXAM: CT ABDOMEN AND PELVIS WITH CONTRAST TECHNIQUE: Multidetector CT imaging of the abdomen and pelvis was performed using the standard protocol following bolus administration of intravenous contrast. CONTRAST:  156m OMNIPAQUE IOHEXOL 300 MG/ML  SOLN COMPARISON:  06/01/2020 FINDINGS: Lower chest:  Calcified granuloma again noted posterior right lower lobe. Hepatobiliary: No suspicious focal abnormality within the liver parenchyma. Numerous gallstones evident. No intrahepatic or extrahepatic biliary dilation. Pancreas: No focal mass lesion. No dilatation of the main duct. No intraparenchymal cyst. No peripancreatic edema. Spleen: Splenectomy with splenule noted. Adrenals/Urinary Tract: No adrenal nodule or mass. Right kidney unremarkable. 11 mm exophytic lesion upper pole right kidney is stable in the interval, likely a cyst complicated by proteinaceous debris or hemorrhage. No evidence for hydroureter. The urinary bladder appears normal for the degree of distention. Stomach/Bowel: Stomach is unremarkable. No gastric wall thickening. No evidence of outlet obstruction. Duodenum is normally positioned as is the ligament of Treitz. No small bowel wall thickening. No small bowel dilatation. Insert right hemicolectomy Diverticular changes are noted in the left colon without evidence of diverticulitis. Vascular/Lymphatic: There is moderate atherosclerotic calcification of the abdominal aorta without aneurysm. IVC filter visualized in situ. There is no gastrohepatic or hepatoduodenal ligament lymphadenopathy. No retroperitoneal or mesenteric lymphadenopathy. No pelvic sidewall lymphadenopathy. Slightly prominent 10 mm short axis right external iliac node on 73/2 is stable.12 mm external iliac node on 68/2 is also unchanged. Reproductive: Prostate gland is markedly enlarged. Other: No intraperitoneal free fluid. Tiny left upper quadrant omental nodules, some of which are calcified, are stable comparing back to 2020 and may be related to prior splenectomy. Musculoskeletal: No worrisome lytic or sclerotic osseous abnormality. IMPRESSION: 1. Stable exam. No new or progressive findings to suggest recurrent or metastatic disease in the abdomen or pelvis. 2. Borderline enlarged right external iliac node is stable in the  interval. 3. Soft tissue nodularity in the right lower quadrant and left omentum is unchanged comparing back to at least 02/01/2019 and may be splenosis related to prior splenectomy. 4. Cholelithiasis. 5. Left colonic diverticulosis without diverticulitis. 6. Marked prostatomegaly. 7. Aortic Atherosclerosis (ICD10-I70.0). Electronically Signed   By: EMisty StanleyM.D.   On: 10/05/2020 16:56  ASSESSMENT:  1.  Stage IV colon cancer (pT3 pN1 M1) MSI high: K-ras mutation negative, BRAF V600 E+, PIK3CA, TP53, CDKN2A 12 cycles of FOLFOX from 08/26/2016 through 01/27/2017 Opdivo from 02/23/2017 through 07/13/2017 FOLFIRI and bevacizumab from 07/27/2017 through 10/13/2018 5-FU and bevacizumab maintenance from 10/27/2018 through 02/22/2019. CT CAP on 02/01/2019 showed 12 mm short axis subcarinal lymph node improved.  No suspicious findings for metastatic disease in the abdomen or pelvis.  Stable nodularity in the left upper abdomen and right lower quadrant. He is on chemo holiday at this time.   2.  Peripheral neuropathy: Improved.  Not requiring gabapentin.   PLAN:  1.  Stage IV colon cancer (pT3 pN1 M1) MSI high: Does not have any clinical signs or symptoms of recurrence at this time. CT scan from 10/04/2020 no recurrence of metastatic disease. CEA is 3.5.  LFTs are normal.  2.  Peripheral neuropathy: Previously on Gabapentin-caused sleepiness. Patient could try Cymbalta 20 mg x 2 weeks to see if he is able to tolerate.  Plan is to increase to 40 mg daily if he is able to tolerate.  Disposition: New prescription sent for Cymbalta. RTC in 4 months for repeat lab work (CEA, CMP, CBC) MD assessment and to review Ct scan.  Orders placed this encounter:  Orders Placed This Encounter  Procedures   CT Abdomen Pelvis W Contrast   I spent 20 minutes dedicated to the care of this patient (face-to-face and non-face-to-face) on the date of the encounter to include what is described in the assessment and  plan.  Faythe Casa, NP 10/12/2020 12:27 PM

## 2020-10-12 ENCOUNTER — Encounter (HOSPITAL_COMMUNITY): Payer: Self-pay | Admitting: Hematology

## 2020-10-12 ENCOUNTER — Encounter: Payer: Self-pay | Admitting: Oncology

## 2020-10-12 ENCOUNTER — Encounter (HOSPITAL_COMMUNITY): Payer: Self-pay | Admitting: Internal Medicine

## 2020-10-15 ENCOUNTER — Other Ambulatory Visit (HOSPITAL_COMMUNITY): Payer: Self-pay

## 2020-10-15 DIAGNOSIS — C184 Malignant neoplasm of transverse colon: Secondary | ICD-10-CM

## 2020-10-15 DIAGNOSIS — C189 Malignant neoplasm of colon, unspecified: Secondary | ICD-10-CM

## 2020-12-28 ENCOUNTER — Encounter (HOSPITAL_COMMUNITY): Payer: Self-pay | Admitting: Internal Medicine

## 2021-02-08 ENCOUNTER — Other Ambulatory Visit: Payer: Self-pay | Admitting: Nurse Practitioner

## 2021-02-11 ENCOUNTER — Inpatient Hospital Stay (HOSPITAL_COMMUNITY): Payer: Medicare Other | Attending: Physician Assistant

## 2021-02-11 ENCOUNTER — Encounter (HOSPITAL_COMMUNITY): Payer: Self-pay

## 2021-02-11 ENCOUNTER — Other Ambulatory Visit: Payer: Self-pay

## 2021-02-11 VITALS — BP 130/68 | HR 80 | Temp 96.7°F | Resp 20

## 2021-02-11 DIAGNOSIS — Z95828 Presence of other vascular implants and grafts: Secondary | ICD-10-CM

## 2021-02-11 DIAGNOSIS — C184 Malignant neoplasm of transverse colon: Secondary | ICD-10-CM

## 2021-02-11 DIAGNOSIS — C189 Malignant neoplasm of colon, unspecified: Secondary | ICD-10-CM

## 2021-02-11 LAB — CBC WITH DIFFERENTIAL/PLATELET
Abs Immature Granulocytes: 0.03 10*3/uL (ref 0.00–0.07)
Basophils Absolute: 0.1 10*3/uL (ref 0.0–0.1)
Basophils Relative: 1 %
Eosinophils Absolute: 0.2 10*3/uL (ref 0.0–0.5)
Eosinophils Relative: 2 %
HCT: 41.8 % (ref 39.0–52.0)
Hemoglobin: 13.7 g/dL (ref 13.0–17.0)
Immature Granulocytes: 0 %
Lymphocytes Relative: 41 %
Lymphs Abs: 4.4 10*3/uL — ABNORMAL HIGH (ref 0.7–4.0)
MCH: 32.9 pg (ref 26.0–34.0)
MCHC: 32.8 g/dL (ref 30.0–36.0)
MCV: 100.5 fL — ABNORMAL HIGH (ref 80.0–100.0)
Monocytes Absolute: 1.1 10*3/uL — ABNORMAL HIGH (ref 0.1–1.0)
Monocytes Relative: 11 %
Neutro Abs: 4.9 10*3/uL (ref 1.7–7.7)
Neutrophils Relative %: 45 %
Platelets: 201 10*3/uL (ref 150–400)
RBC: 4.16 MIL/uL — ABNORMAL LOW (ref 4.22–5.81)
RDW: 13 % (ref 11.5–15.5)
WBC: 10.7 10*3/uL — ABNORMAL HIGH (ref 4.0–10.5)
nRBC: 0 % (ref 0.0–0.2)

## 2021-02-11 LAB — COMPREHENSIVE METABOLIC PANEL
ALT: 20 U/L (ref 0–44)
AST: 28 U/L (ref 15–41)
Albumin: 3.9 g/dL (ref 3.5–5.0)
Alkaline Phosphatase: 54 U/L (ref 38–126)
Anion gap: 7 (ref 5–15)
BUN: 23 mg/dL (ref 8–23)
CO2: 27 mmol/L (ref 22–32)
Calcium: 9.1 mg/dL (ref 8.9–10.3)
Chloride: 101 mmol/L (ref 98–111)
Creatinine, Ser: 1.17 mg/dL (ref 0.61–1.24)
GFR, Estimated: >60 mL/min (ref >60)
Glucose, Bld: 169 mg/dL — ABNORMAL HIGH (ref 70–99)
Potassium: 4.5 mmol/L (ref 3.5–5.1)
Sodium: 135 mmol/L (ref 135–145)
Total Bilirubin: 1.2 mg/dL (ref 0.3–1.2)
Total Protein: 7 g/dL (ref 6.5–8.1)

## 2021-02-11 MED ORDER — SODIUM CHLORIDE 0.9% FLUSH
10.0000 mL | INTRAVENOUS | Status: DC | PRN
  Administered 2021-02-11: 10 mL via INTRAVENOUS

## 2021-02-11 MED ORDER — HEPARIN SOD (PORK) LOCK FLUSH 100 UNIT/ML IV SOLN
500.0000 [IU] | Freq: Once | INTRAVENOUS | Status: AC
  Administered 2021-02-11: 500 [IU] via INTRAVENOUS

## 2021-02-11 NOTE — Progress Notes (Signed)
Kevin Kirk presented for [employer] and flush.  Portacath located left chest wall accessed with  H 20 needle.  Good blood return present. Portacath flushed with 4ml NS and 500U/50ml Heparin and needle removed intact.  Procedure tolerated well during and after  without incident.  Discharged ambulatory in c/o family in stable condition.

## 2021-02-11 NOTE — Patient Instructions (Signed)
[facility]  Discharge Instructions: Thank you for choosing [facility] to provide your oncology and hematology care.  If you have a lab appointment with the [facility], please come in thru the Main Entrance and check in at the main information desk.  Wear comfortable clothing and clothing appropriate for easy access to any Portacath or PICC line.   We strive to give you quality time with your provider. You may need to reschedule your appointment if you arrive late (15 or more minutes).  Arriving late affects you and other patients whose appointments are after yours.  Also, if you miss three or more appointments without notifying the office, you may be dismissed from the clinic at the provider's discretion.      For prescription refill requests, have your pharmacy contact our office and allow 72 hours for refills to be completed.    Today you received the following:  Port flush and lab draw      To help prevent nausea and vomiting after your treatment, we encourage you to take your nausea medication as directed.  BELOW ARE SYMPTOMS THAT SHOULD BE REPORTED IMMEDIATELY: *FEVER GREATER THAN 100.4 F (38 C) OR HIGHER *CHILLS OR SWEATING *NAUSEA AND VOMITING THAT IS NOT CONTROLLED WITH YOUR NAUSEA MEDICATION *UNUSUAL SHORTNESS OF BREATH *UNUSUAL BRUISING OR BLEEDING *URINARY PROBLEMS (pain or burning when urinating, or frequent urination) *BOWEL PROBLEMS (unusual diarrhea, constipation, pain near the anus) TENDERNESS IN MOUTH AND THROAT WITH OR WITHOUT PRESENCE OF ULCERS (sore throat, sores in mouth, or a toothache) UNUSUAL RASH, SWELLING OR PAIN  UNUSUAL VAGINAL DISCHARGE OR ITCHING   Items with * indicate a potential emergency and should be followed up as soon as possible or go to the Emergency Department if any problems should occur.  Please show the CHEMOTHERAPY ALERT CARD or IMMUNOTHERAPY ALERT CARD at check-in to the Emergency Department and triage  nurse.  Should you have questions after your visit or need to cancel or reschedule your appointment, please contact [facility] [phone]  and follow the prompts.  Office hours are 8:00 a.m. to 4:30 p.m. Monday - Friday. Please note that voicemails left after 4:00 p.m. may not be returned until the following business day.  We are closed weekends and major holidays. You have access to a nurse at all times for urgent questions. Please call the main number to the clinic [phone] and follow the prompts.  For any non-urgent questions, you may also contact your provider using MyChart. We now offer e-Visits for anyone 73 and older to request care online for non-urgent symptoms. For details visit [email or web address].   Also download the MyChart app! Go to the app store, search "MyChart", open the app, select [facility], and log in with your MyChart username and password.  Due to Covid, a mask is required upon entering the hospital/clinic. If you do not have a mask, one will be given to you upon arrival. For doctor visits, patients may have 1 support person aged 21 or older with them. For treatment visits, patients cannot have anyone with them due to current Covid guidelines and our immunocompromised population.

## 2021-02-12 LAB — CEA: CEA: 3.2 ng/mL (ref 0.0–4.7)

## 2021-02-19 ENCOUNTER — Inpatient Hospital Stay (HOSPITAL_COMMUNITY): Payer: Medicare Other

## 2021-02-19 ENCOUNTER — Ambulatory Visit (HOSPITAL_COMMUNITY): Admission: RE | Admit: 2021-02-19 | Payer: Medicare Other | Source: Ambulatory Visit

## 2021-02-21 ENCOUNTER — Telehealth (HOSPITAL_COMMUNITY): Payer: Medicare Other | Admitting: Hematology

## 2021-02-21 ENCOUNTER — Ambulatory Visit (HOSPITAL_COMMUNITY): Payer: Medicare Other

## 2021-02-21 ENCOUNTER — Encounter (HOSPITAL_COMMUNITY): Payer: Medicare Other

## 2021-02-26 ENCOUNTER — Other Ambulatory Visit: Payer: Self-pay

## 2021-02-26 ENCOUNTER — Inpatient Hospital Stay (HOSPITAL_COMMUNITY): Payer: Medicare Other | Admitting: Hematology

## 2021-02-26 ENCOUNTER — Inpatient Hospital Stay (HOSPITAL_COMMUNITY): Payer: Medicare Other | Attending: Physician Assistant

## 2021-02-26 ENCOUNTER — Ambulatory Visit (HOSPITAL_COMMUNITY)
Admission: RE | Admit: 2021-02-26 | Discharge: 2021-02-26 | Disposition: A | Discharge status: Home / Self Care | Payer: Medicare Other | Source: Ambulatory Visit | Attending: Oncology | Admitting: Oncology

## 2021-02-26 DIAGNOSIS — C184 Malignant neoplasm of transverse colon: Secondary | ICD-10-CM | POA: Diagnosis present

## 2021-02-26 MED ORDER — IOHEXOL 300 MG/ML  SOLN
100.0000 mL | Freq: Once | INTRAMUSCULAR | Status: AC | PRN
  Administered 2021-02-26: 100 mL via INTRAVENOUS

## 2021-02-26 MED ORDER — HEPARIN SOD (PORK) LOCK FLUSH 100 UNIT/ML IV SOLN
INTRAVENOUS | Status: AC
  Administered 2021-02-26: 500 [IU] via INTRAVENOUS
  Filled 2021-02-26: qty 5

## 2021-02-26 NOTE — Patient Instructions (Signed)
[facility]  Discharge Instructions: Thank you for choosing [facility] to provide your oncology and hematology care.  If you have a lab appointment with the [facility], please come in thru the Main Entrance and check in at the main information desk.  Wear comfortable clothing and clothing appropriate for easy access to any Portacath or PICC line.   We strive to give you quality time with your provider. You may need to reschedule your appointment if you arrive late (15 or more minutes).  Arriving late affects you and other patients whose appointments are after yours.  Also, if you miss three or more appointments without notifying the office, you may be dismissed from the clinic at the provider's discretion.      For prescription refill requests, have your pharmacy contact our office and allow 72 hours for refills to be completed.    Today you received the following chemotherapy and/or immunotherapy agents PORT flush and access for CT scan      To help prevent nausea and vomiting after your treatment, we encourage you to take your nausea medication as directed.  BELOW ARE SYMPTOMS THAT SHOULD BE REPORTED IMMEDIATELY: *FEVER GREATER THAN 100.4 F (38 C) OR HIGHER *CHILLS OR SWEATING *NAUSEA AND VOMITING THAT IS NOT CONTROLLED WITH YOUR NAUSEA MEDICATION *UNUSUAL SHORTNESS OF BREATH *UNUSUAL BRUISING OR BLEEDING *URINARY PROBLEMS (pain or burning when urinating, or frequent urination) *BOWEL PROBLEMS (unusual diarrhea, constipation, pain near the anus) TENDERNESS IN MOUTH AND THROAT WITH OR WITHOUT PRESENCE OF ULCERS (sore throat, sores in mouth, or a toothache) UNUSUAL RASH, SWELLING OR PAIN  UNUSUAL VAGINAL DISCHARGE OR ITCHING   Items with * indicate a potential emergency and should be followed up as soon as possible or go to the Emergency Department if any problems should occur.  Please show the CHEMOTHERAPY ALERT CARD or IMMUNOTHERAPY ALERT CARD at check-in  to the Emergency Department and triage nurse.  Should you have questions after your visit or need to cancel or reschedule your appointment, please contact [facility] [phone]  and follow the prompts.  Office hours are 8:00 a.m. to 4:30 p.m. Monday - Friday. Please note that voicemails left after 4:00 p.m. may not be returned until the following business day.  We are closed weekends and major holidays. You have access to a nurse at all times for urgent questions. Please call the main number to the clinic [phone] and follow the prompts.  For any non-urgent questions, you may also contact your provider using MyChart. We now offer e-Visits for anyone 104 and older to request care online for non-urgent symptoms. For details visit [email or web address].   Also download the MyChart app! Go to the app store, search "MyChart", open the app, select [facility], and log in with your MyChart username and password.  Due to Covid, a mask is required upon entering the hospital/clinic. If you do not have a mask, one will be given to you upon arrival. For doctor visits, patients may have 1 support person aged 25 or older with them. For treatment visits, patients cannot have anyone with them due to current Covid guidelines and our immunocompromised population.

## 2021-02-26 NOTE — Progress Notes (Signed)
Patients port flushed without difficulty.  Good blood return noted with no bruising or swelling noted at site.  Patient to remain accessed for CT scan.

## 2021-02-28 ENCOUNTER — Inpatient Hospital Stay (HOSPITAL_BASED_OUTPATIENT_CLINIC_OR_DEPARTMENT_OTHER): Payer: Medicare Other | Admitting: Hematology

## 2021-02-28 ENCOUNTER — Other Ambulatory Visit: Payer: Self-pay

## 2021-02-28 DIAGNOSIS — C184 Malignant neoplasm of transverse colon: Secondary | ICD-10-CM

## 2021-02-28 NOTE — Progress Notes (Signed)
Virtual Visit via Telephone Note  I connected with Kevin Kirk on 02/28/21 at  4:15 PM EST by telephone and verified that I am speaking with the correct person using two identifiers.  Location: Patient: At home Provider: In office   I discussed the limitations, risks, security and privacy concerns of performing an evaluation and management service by telephone and the availability of in person appointments. I also discussed with the patient that there may be a patient responsible charge related to this service. The patient expressed understanding and agreed to proceed.   History of Present Illness: He is being seen in our clinic for surveillance of metastatic colon cancer.   Observations/Objective: He denies any abdominal pains.  Denies any weight loss or loss of appetite.  Denies new onset pains.  Assessment and Plan:  1.  Stage IV colon cancer, MSI high: - His last chemotherapy was in December 2020. - We reviewed labs from 02/11/2021 which showed normal LFTs.  CBC was grossly normal.  CEA was 3.2. - Reviewed CT CAP from 02/27/2021 which showed no evidence of recurrence or metastatic disease.  Stable borderline enlarged right external iliac lymph nodes.  Stable enlarged prostate. - Recommend follow-up in 6 months with repeat CT CAP, CEA and other labs.   Follow Up Instructions: RTC 6 months for follow-up.   I discussed the assessment and treatment plan with the patient. The patient was provided an opportunity to ask questions and all were answered. The patient agreed with the plan and demonstrated an understanding of the instructions.   The patient was advised to call back or seek an in-person evaluation if the symptoms worsen or if the condition fails to improve as anticipated.  I provided 21 minutes of non-face-to-face time during this encounter.   Derek Jack, MD

## 2021-03-01 ENCOUNTER — Other Ambulatory Visit (HOSPITAL_COMMUNITY): Payer: Self-pay

## 2021-03-01 DIAGNOSIS — C184 Malignant neoplasm of transverse colon: Secondary | ICD-10-CM

## 2021-03-01 NOTE — Progress Notes (Signed)
Note received from Dr. Delton Coombes- Please order CT CAP, CEA, CBC and CMP in 6 months.  Reason: Metastatic colon cancer surveillance. Orders placed per his request.

## 2021-05-30 ENCOUNTER — Encounter (HOSPITAL_COMMUNITY): Payer: Medicare Other

## 2021-08-26 ENCOUNTER — Encounter (HOSPITAL_COMMUNITY): Payer: Self-pay

## 2021-08-26 ENCOUNTER — Ambulatory Visit (HOSPITAL_COMMUNITY)
Admission: RE | Admit: 2021-08-26 | Discharge: 2021-08-26 | Disposition: A | Discharge status: Home / Self Care | Payer: Medicare Other | Source: Ambulatory Visit | Attending: Hematology | Admitting: Hematology

## 2021-08-26 ENCOUNTER — Inpatient Hospital Stay (HOSPITAL_COMMUNITY): Payer: Medicare Other | Attending: Hematology

## 2021-08-26 DIAGNOSIS — C184 Malignant neoplasm of transverse colon: Secondary | ICD-10-CM | POA: Diagnosis present

## 2021-08-26 LAB — CBC WITH DIFFERENTIAL/PLATELET
Abs Immature Granulocytes: 0.04 10*3/uL (ref 0.00–0.07)
Basophils Absolute: 0.1 10*3/uL (ref 0.0–0.1)
Basophils Relative: 1 %
Eosinophils Absolute: 0.3 10*3/uL (ref 0.0–0.5)
Eosinophils Relative: 3 %
HCT: 37.9 % — ABNORMAL LOW (ref 39.0–52.0)
Hemoglobin: 12.1 g/dL — ABNORMAL LOW (ref 13.0–17.0)
Immature Granulocytes: 1 %
Lymphocytes Relative: 31 %
Lymphs Abs: 2.6 10*3/uL (ref 0.7–4.0)
MCH: 31 pg (ref 26.0–34.0)
MCHC: 31.9 g/dL (ref 30.0–36.0)
MCV: 97.2 fL (ref 80.0–100.0)
Monocytes Absolute: 1 10*3/uL (ref 0.1–1.0)
Monocytes Relative: 11 %
Neutro Abs: 4.6 10*3/uL (ref 1.7–7.7)
Neutrophils Relative %: 53 %
Platelets: 251 10*3/uL (ref 150–400)
RBC: 3.9 MIL/uL — ABNORMAL LOW (ref 4.22–5.81)
RDW: 13.8 % (ref 11.5–15.5)
WBC: 8.5 10*3/uL (ref 4.0–10.5)
nRBC: 0 % (ref 0.0–0.2)

## 2021-08-26 LAB — COMPREHENSIVE METABOLIC PANEL
ALT: 18 U/L (ref 0–44)
AST: 19 U/L (ref 15–41)
Albumin: 3.7 g/dL (ref 3.5–5.0)
Alkaline Phosphatase: 47 U/L (ref 38–126)
Anion gap: 4 — ABNORMAL LOW (ref 5–15)
BUN: 20 mg/dL (ref 8–23)
CO2: 24 mmol/L (ref 22–32)
Calcium: 8.8 mg/dL — ABNORMAL LOW (ref 8.9–10.3)
Chloride: 106 mmol/L (ref 98–111)
Creatinine, Ser: 1.11 mg/dL (ref 0.61–1.24)
GFR, Estimated: >60 mL/min (ref >60)
Glucose, Bld: 125 mg/dL — ABNORMAL HIGH (ref 70–99)
Potassium: 4.4 mmol/L (ref 3.5–5.1)
Sodium: 134 mmol/L — ABNORMAL LOW (ref 135–145)
Total Bilirubin: 0.8 mg/dL (ref 0.3–1.2)
Total Protein: 7.7 g/dL (ref 6.5–8.1)

## 2021-08-26 MED ORDER — HEPARIN SOD (PORK) LOCK FLUSH 100 UNIT/ML IV SOLN
500.0000 [IU] | Freq: Once | INTRAVENOUS | Status: AC
  Administered 2021-08-26: 500 [IU] via INTRAVENOUS

## 2021-08-26 MED ORDER — IOHEXOL 300 MG/ML  SOLN
100.0000 mL | Freq: Once | INTRAMUSCULAR | Status: AC | PRN
  Administered 2021-08-26: 100 mL via INTRAVENOUS

## 2021-08-27 LAB — CEA: CEA: 3.2 ng/mL (ref 0.0–4.7)

## 2021-09-02 ENCOUNTER — Inpatient Hospital Stay (HOSPITAL_COMMUNITY): Payer: Medicare Other | Admitting: Hematology
